# Patient Record
Sex: Male | Born: 1945 | Race: White | Hispanic: No | Smoking: Former smoker
Health system: Southern US, Community
[De-identification: ages and names within clinical notes are randomized; demographics above are authoritative.]

## PROBLEM LIST (undated history)

## (undated) DIAGNOSIS — E079 Disorder of thyroid, unspecified: Secondary | ICD-10-CM

## (undated) DIAGNOSIS — L304 Erythema intertrigo: Secondary | ICD-10-CM

## (undated) DIAGNOSIS — C9110 Chronic lymphocytic leukemia of B-cell type not having achieved remission: Secondary | ICD-10-CM

## (undated) DIAGNOSIS — R0609 Other forms of dyspnea: Secondary | ICD-10-CM

## (undated) DIAGNOSIS — F4321 Adjustment disorder with depressed mood: Secondary | ICD-10-CM

## (undated) DIAGNOSIS — I499 Cardiac arrhythmia, unspecified: Secondary | ICD-10-CM

## (undated) DIAGNOSIS — R61 Generalized hyperhidrosis: Secondary | ICD-10-CM

## (undated) DIAGNOSIS — H1030 Unspecified acute conjunctivitis, unspecified eye: Secondary | ICD-10-CM

## (undated) DIAGNOSIS — R635 Abnormal weight gain: Secondary | ICD-10-CM

## (undated) DIAGNOSIS — H05019 Cellulitis of unspecified orbit: Secondary | ICD-10-CM

## (undated) DIAGNOSIS — I350 Nonrheumatic aortic (valve) stenosis: Secondary | ICD-10-CM

## (undated) DIAGNOSIS — K8020 Calculus of gallbladder without cholecystitis without obstruction: Secondary | ICD-10-CM

## (undated) DIAGNOSIS — E785 Hyperlipidemia, unspecified: Secondary | ICD-10-CM

## (undated) DIAGNOSIS — M25519 Pain in unspecified shoulder: Secondary | ICD-10-CM

## (undated) DIAGNOSIS — G839 Paralytic syndrome, unspecified: Secondary | ICD-10-CM

## (undated) DIAGNOSIS — D485 Neoplasm of uncertain behavior of skin: Secondary | ICD-10-CM

## (undated) DIAGNOSIS — J449 Chronic obstructive pulmonary disease, unspecified: Secondary | ICD-10-CM

## (undated) DIAGNOSIS — K219 Gastro-esophageal reflux disease without esophagitis: Secondary | ICD-10-CM

## (undated) DIAGNOSIS — F1010 Alcohol abuse, uncomplicated: Secondary | ICD-10-CM

## (undated) DIAGNOSIS — N62 Hypertrophy of breast: Secondary | ICD-10-CM

## (undated) DIAGNOSIS — F32A Depression, unspecified: Secondary | ICD-10-CM

## (undated) DIAGNOSIS — I10 Essential (primary) hypertension: Secondary | ICD-10-CM

## (undated) DIAGNOSIS — M2550 Pain in unspecified joint: Secondary | ICD-10-CM

## (undated) DIAGNOSIS — M79642 Pain in left hand: Secondary | ICD-10-CM

## (undated) DIAGNOSIS — M654 Radial styloid tenosynovitis [de Quervain]: Secondary | ICD-10-CM

## (undated) DIAGNOSIS — H269 Unspecified cataract: Secondary | ICD-10-CM

## (undated) DIAGNOSIS — E039 Hypothyroidism, unspecified: Secondary | ICD-10-CM

## (undated) DIAGNOSIS — R300 Dysuria: Secondary | ICD-10-CM

## (undated) DIAGNOSIS — G4733 Obstructive sleep apnea (adult) (pediatric): Secondary | ICD-10-CM

## (undated) DIAGNOSIS — I77810 Thoracic aortic ectasia: Secondary | ICD-10-CM

## (undated) DIAGNOSIS — H3320 Serous retinal detachment, unspecified eye: Secondary | ICD-10-CM

## (undated) DIAGNOSIS — R51 Headache: Secondary | ICD-10-CM

## (undated) DIAGNOSIS — F411 Generalized anxiety disorder: Secondary | ICD-10-CM

## (undated) DIAGNOSIS — F329 Major depressive disorder, single episode, unspecified: Secondary | ICD-10-CM

## (undated) DIAGNOSIS — R002 Palpitations: Secondary | ICD-10-CM

## (undated) HISTORY — DX: Disorder of thyroid, unspecified: E07.9

## (undated) HISTORY — DX: Pain in unspecified shoulder: M25.519

## (undated) HISTORY — DX: Chronic obstructive pulmonary disease, unspecified: J44.9

## (undated) HISTORY — DX: Palpitations: R00.2

## (undated) HISTORY — DX: Headache: R51

## (undated) HISTORY — PX: RETINAL DETACHMENT SURGERY: SHX105

## (undated) HISTORY — DX: Neoplasm of uncertain behavior of skin: D48.5

## (undated) HISTORY — DX: Gastro-esophageal reflux disease without esophagitis: K21.9

## (undated) HISTORY — DX: Hypothyroidism, unspecified: E03.9

## (undated) HISTORY — DX: Chronic lymphocytic leukemia of B-cell type not having achieved remission: C91.10

## (undated) HISTORY — DX: Alcohol abuse, uncomplicated: F10.10

## (undated) HISTORY — PX: TONSILLECTOMY: SUR1361

## (undated) HISTORY — DX: Hypertrophy of breast: N62

## (undated) HISTORY — DX: Dysuria: R30.0

## (undated) HISTORY — PX: POSTERIOR LAMINECTOMY / DECOMPRESSION CERVICAL SPINE: SUR739

## (undated) HISTORY — DX: Generalized hyperhidrosis: R61

## (undated) HISTORY — DX: Obstructive sleep apnea (adult) (pediatric): G47.33

## (undated) HISTORY — PX: CATARACT EXTRACTION: SUR2

## (undated) HISTORY — DX: Unspecified cataract: H26.9

## (undated) HISTORY — DX: Erythema intertrigo: L30.4

## (undated) HISTORY — DX: Serous retinal detachment, unspecified eye: H33.20

## (undated) HISTORY — DX: Unspecified acute conjunctivitis, unspecified eye: H10.30

## (undated) HISTORY — DX: Pain in unspecified joint: M25.50

## (undated) HISTORY — DX: Thoracic aortic ectasia: I77.810

## (undated) HISTORY — DX: Essential (primary) hypertension: I10

## (undated) HISTORY — DX: Calculus of gallbladder without cholecystitis without obstruction: K80.20

## (undated) HISTORY — DX: Radial styloid tenosynovitis (de quervain): M65.4

## (undated) HISTORY — DX: Cardiac arrhythmia, unspecified: I49.9

## (undated) HISTORY — DX: Generalized anxiety disorder: F41.1

## (undated) HISTORY — DX: Hyperlipidemia, unspecified: E78.5

## (undated) HISTORY — DX: Adjustment disorder with depressed mood: F43.21

## (undated) HISTORY — DX: Abnormal weight gain: R63.5

## (undated) HISTORY — DX: Pain in left hand: M79.642

## (undated) HISTORY — DX: Cellulitis of unspecified orbit: H05.019

## (undated) HISTORY — DX: Nonrheumatic aortic (valve) stenosis: I35.0

## (undated) HISTORY — DX: Paralytic syndrome, unspecified: G83.9

## (undated) HISTORY — DX: Other forms of dyspnea: R06.09

## (undated) HISTORY — PX: BICEPS TENDON REPAIR: SHX566

---
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
## 1998-05-07 ENCOUNTER — Ambulatory Visit (HOSPITAL_COMMUNITY): Admission: RE | Admit: 1998-05-07 | Discharge: 1998-05-07 | Payer: Self-pay | Admitting: Internal Medicine

## 1998-05-07 ENCOUNTER — Encounter: Payer: Self-pay | Admitting: Internal Medicine

## 1999-04-14 ENCOUNTER — Encounter (INDEPENDENT_AMBULATORY_CARE_PROVIDER_SITE_OTHER): Payer: Self-pay | Admitting: Specialist

## 1999-04-14 ENCOUNTER — Other Ambulatory Visit: Admission: RE | Admit: 1999-04-14 | Discharge: 1999-04-14 | Payer: Self-pay | Admitting: Gastroenterology

## 1999-04-14 HISTORY — PX: COLONOSCOPY: SHX174

## 1999-04-14 HISTORY — PX: POLYPECTOMY: SHX149

## 2000-06-15 ENCOUNTER — Ambulatory Visit (HOSPITAL_COMMUNITY): Admission: RE | Admit: 2000-06-15 | Discharge: 2000-06-15 | Payer: Self-pay | Admitting: Internal Medicine

## 2001-03-07 ENCOUNTER — Encounter: Payer: Self-pay | Admitting: Emergency Medicine

## 2001-03-08 ENCOUNTER — Inpatient Hospital Stay (HOSPITAL_COMMUNITY): Admission: AC | Admit: 2001-03-08 | Discharge: 2001-03-10 | Payer: Self-pay

## 2001-03-08 ENCOUNTER — Encounter: Payer: Self-pay | Admitting: Internal Medicine

## 2001-03-28 ENCOUNTER — Encounter: Payer: Self-pay | Admitting: Neurosurgery

## 2001-03-30 ENCOUNTER — Ambulatory Visit (HOSPITAL_COMMUNITY): Admission: RE | Admit: 2001-03-30 | Discharge: 2001-03-31 | Payer: Self-pay | Admitting: Neurosurgery

## 2001-03-30 ENCOUNTER — Encounter: Payer: Self-pay | Admitting: Neurosurgery

## 2001-05-16 ENCOUNTER — Encounter: Payer: Self-pay | Admitting: Neurosurgery

## 2001-05-16 ENCOUNTER — Ambulatory Visit (HOSPITAL_COMMUNITY): Admission: RE | Admit: 2001-05-16 | Discharge: 2001-05-16 | Payer: Self-pay | Admitting: Neurosurgery

## 2001-11-15 ENCOUNTER — Encounter: Admission: RE | Admit: 2001-11-15 | Discharge: 2001-11-15 | Payer: Self-pay | Admitting: Neurosurgery

## 2001-11-15 ENCOUNTER — Encounter: Payer: Self-pay | Admitting: Neurosurgery

## 2002-03-22 HISTORY — PX: ROTATOR CUFF REPAIR: SHX139

## 2005-12-07 ENCOUNTER — Ambulatory Visit (HOSPITAL_COMMUNITY): Admission: RE | Admit: 2005-12-07 | Discharge: 2005-12-08 | Payer: Self-pay | Admitting: Ophthalmology

## 2005-12-21 ENCOUNTER — Ambulatory Visit: Payer: Self-pay | Admitting: Internal Medicine

## 2005-12-23 ENCOUNTER — Ambulatory Visit: Payer: Self-pay | Admitting: Internal Medicine

## 2006-01-20 ENCOUNTER — Ambulatory Visit (HOSPITAL_COMMUNITY): Admission: RE | Admit: 2006-01-20 | Discharge: 2006-01-21 | Payer: Self-pay | Admitting: Ophthalmology

## 2006-12-02 ENCOUNTER — Encounter: Payer: Self-pay | Admitting: Internal Medicine

## 2006-12-02 DIAGNOSIS — E785 Hyperlipidemia, unspecified: Secondary | ICD-10-CM

## 2006-12-02 DIAGNOSIS — K219 Gastro-esophageal reflux disease without esophagitis: Secondary | ICD-10-CM

## 2006-12-02 HISTORY — DX: Gastro-esophageal reflux disease without esophagitis: K21.9

## 2007-07-20 ENCOUNTER — Ambulatory Visit: Payer: Self-pay | Admitting: Internal Medicine

## 2007-07-21 LAB — CONVERTED CEMR LAB
ALT: 19 units/L (ref 0–53)
AST: 20 units/L (ref 0–37)
Albumin: 3.7 g/dL (ref 3.5–5.2)
Alkaline Phosphatase: 68 units/L (ref 39–117)
BUN: 7 mg/dL (ref 6–23)
Bacteria, UA: NEGATIVE
Basophils Absolute: 0 10*3/uL (ref 0.0–0.1)
Basophils Relative: 0 % (ref 0.0–1.0)
Bilirubin Urine: NEGATIVE
Bilirubin, Direct: 0.1 mg/dL (ref 0.0–0.3)
CO2: 33 meq/L — ABNORMAL HIGH (ref 19–32)
Calcium: 9.3 mg/dL (ref 8.4–10.5)
Chloride: 103 meq/L (ref 96–112)
Cholesterol: 185 mg/dL (ref 0–200)
Creatinine, Ser: 0.9 mg/dL (ref 0.4–1.5)
Crystals: NEGATIVE
Direct LDL: 109.1 mg/dL
Eosinophils Absolute: 0.2 10*3/uL (ref 0.0–0.7)
Eosinophils Relative: 2 % (ref 0.0–5.0)
GFR calc Af Amer: 110 mL/min
GFR calc non Af Amer: 91 mL/min
Glucose, Bld: 91 mg/dL (ref 70–99)
HCT: 52.1 % — ABNORMAL HIGH (ref 39.0–52.0)
HDL: 32.5 mg/dL — ABNORMAL LOW (ref >39.0)
Hemoglobin: 17.2 g/dL — ABNORMAL HIGH (ref 13.0–17.0)
Ketones, ur: NEGATIVE mg/dL
Leukocytes, UA: NEGATIVE
Lymphocytes Relative: 45.4 % (ref 12.0–46.0)
MCHC: 33.1 g/dL (ref 30.0–36.0)
MCV: 93.3 fL (ref 78.0–100.0)
Monocytes Absolute: 0.8 10*3/uL (ref 0.1–1.0)
Monocytes Relative: 8.7 % (ref 3.0–12.0)
Mucus, UA: NEGATIVE
Neutro Abs: 4 10*3/uL (ref 1.4–7.7)
Neutrophils Relative %: 43.9 % (ref 43.0–77.0)
Nitrite: NEGATIVE
PSA: 0.57 ng/mL (ref 0.10–4.00)
Platelets: 204 10*3/uL (ref 150–400)
Potassium: 4.9 meq/L (ref 3.5–5.1)
RBC: 5.59 M/uL (ref 4.22–5.81)
RDW: 13.5 % (ref 11.5–14.6)
Sodium: 142 meq/L (ref 135–145)
Specific Gravity, Urine: 1.01 (ref 1.000–1.03)
TSH: 2.2 microintl units/mL (ref 0.35–5.50)
Total Bilirubin: 0.8 mg/dL (ref 0.3–1.2)
Total CHOL/HDL Ratio: 5.7
Total Protein, Urine: NEGATIVE mg/dL
Total Protein: 7.1 g/dL (ref 6.0–8.3)
Triglycerides: 306 mg/dL (ref 0–149)
Urine Glucose: NEGATIVE mg/dL
Urobilinogen, UA: 0.2 (ref 0.0–1.0)
VLDL: 61 mg/dL — ABNORMAL HIGH (ref 0–40)
WBC, UA: NONE SEEN cells/hpf
WBC: 9.1 10*3/uL (ref 4.5–10.5)
pH: 6 (ref 5.0–8.0)

## 2007-07-26 ENCOUNTER — Ambulatory Visit: Payer: Self-pay | Admitting: Internal Medicine

## 2007-07-26 DIAGNOSIS — F17200 Nicotine dependence, unspecified, uncomplicated: Secondary | ICD-10-CM

## 2007-07-26 DIAGNOSIS — J449 Chronic obstructive pulmonary disease, unspecified: Secondary | ICD-10-CM | POA: Insufficient documentation

## 2007-07-26 DIAGNOSIS — I10 Essential (primary) hypertension: Secondary | ICD-10-CM

## 2007-07-26 HISTORY — DX: Chronic obstructive pulmonary disease, unspecified: J44.9

## 2009-02-22 ENCOUNTER — Ambulatory Visit: Payer: Self-pay | Admitting: Family Medicine

## 2009-02-22 DIAGNOSIS — H05019 Cellulitis of unspecified orbit: Secondary | ICD-10-CM

## 2009-02-22 DIAGNOSIS — H1030 Unspecified acute conjunctivitis, unspecified eye: Secondary | ICD-10-CM | POA: Insufficient documentation

## 2009-02-22 HISTORY — DX: Cellulitis of unspecified orbit: H05.019

## 2009-05-07 ENCOUNTER — Ambulatory Visit: Payer: Self-pay | Admitting: Internal Medicine

## 2009-05-07 LAB — CONVERTED CEMR LAB
ALT: 18 units/L (ref 0–53)
AST: 23 units/L (ref 0–37)
Albumin: 4 g/dL (ref 3.5–5.2)
Alkaline Phosphatase: 73 units/L (ref 39–117)
BUN: 5 mg/dL — ABNORMAL LOW (ref 6–23)
Basophils Absolute: 0 10*3/uL (ref 0.0–0.1)
Basophils Relative: 0.2 % (ref 0.0–3.0)
Bilirubin Urine: NEGATIVE
Bilirubin, Direct: 0.2 mg/dL (ref 0.0–0.3)
CO2: 31 meq/L (ref 19–32)
Calcium: 9 mg/dL (ref 8.4–10.5)
Chloride: 105 meq/L (ref 96–112)
Cholesterol: 163 mg/dL (ref 0–200)
Creatinine, Ser: 0.7 mg/dL (ref 0.4–1.5)
Eosinophils Absolute: 0.1 10*3/uL (ref 0.0–0.7)
Eosinophils Relative: 1.3 % (ref 0.0–5.0)
GFR calc non Af Amer: 121 mL/min (ref >60)
Glucose, Bld: 90 mg/dL (ref 70–99)
HCT: 47.4 % (ref 39.0–52.0)
HDL: 46.2 mg/dL (ref >39.00)
Hemoglobin, Urine: NEGATIVE
Hemoglobin: 16.2 g/dL (ref 13.0–17.0)
Ketones, ur: NEGATIVE mg/dL
LDL Cholesterol: 90 mg/dL (ref 0–99)
Leukocytes, UA: NEGATIVE
Lymphocytes Relative: 26.3 % (ref 12.0–46.0)
Lymphs Abs: 2.6 10*3/uL (ref 0.7–4.0)
MCHC: 34.1 g/dL (ref 30.0–36.0)
MCV: 95.6 fL (ref 78.0–100.0)
Monocytes Absolute: 1 10*3/uL (ref 0.1–1.0)
Monocytes Relative: 10.1 % (ref 3.0–12.0)
Neutro Abs: 6.2 10*3/uL (ref 1.4–7.7)
Neutrophils Relative %: 62.1 % (ref 43.0–77.0)
Nitrite: NEGATIVE
PSA: 0.25 ng/mL (ref 0.10–4.00)
Platelets: 170 10*3/uL (ref 150.0–400.0)
Potassium: 4.3 meq/L (ref 3.5–5.1)
RBC: 4.96 M/uL (ref 4.22–5.81)
RDW: 12.4 % (ref 11.5–14.6)
Sodium: 140 meq/L (ref 135–145)
Specific Gravity, Urine: <=1.005 (ref 1.000–1.030)
TSH: 1.57 microintl units/mL (ref 0.35–5.50)
Total Bilirubin: 0.9 mg/dL (ref 0.3–1.2)
Total CHOL/HDL Ratio: 4
Total Protein, Urine: NEGATIVE mg/dL
Total Protein: 6.8 g/dL (ref 6.0–8.3)
Triglycerides: 136 mg/dL (ref 0.0–149.0)
Urine Glucose: NEGATIVE mg/dL
Urobilinogen, UA: 0.2 (ref 0.0–1.0)
VLDL: 27.2 mg/dL (ref 0.0–40.0)
WBC: 9.9 10*3/uL (ref 4.5–10.5)
pH: 6 (ref 5.0–8.0)

## 2009-05-14 ENCOUNTER — Ambulatory Visit: Payer: Self-pay | Admitting: Internal Medicine

## 2010-04-21 NOTE — Assessment & Plan Note (Signed)
Summary: CPX/BCBS/#/CD   Vital Signs:  Patient profile:   65 year old male Height:      71 inches Weight:      173 pounds BMI:     24.22 Temp:     98.1 degrees F oral Pulse rate:   64 / minute BP sitting:   140 / 84  (left arm)  Vitals Entered By: Tora Perches (May 14, 2009 8:47 AM) CC: cpx Is Patient Diabetic? No   CC:  cpx.  History of Present Illness: The patient presents for a wellness examination C/o joint stiffness, stress, anxiety  Preventive Screening-Counseling & Management  Alcohol-Tobacco     Smoking Status: current  Current Medications (verified): 1)  Baby Aspirin 81 Mg  Chew (Aspirin) .... Once Daily 2)  Lovastatin 20 Mg  Tabs (Lovastatin) .... Once Daily 3)  Norvasc 5 Mg  Tabs (Amlodipine Besylate) .... 1/2 Once Daily 4)  Vitamin D3 1000 Unit  Tabs (Cholecalciferol) .[name].. 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 COPD Hypertension  Past Surgical History: Last updated: 07/26/2007 Cervical laminectomy/decompression Dr Wynetta Emery  Family History: Last updated: 07/26/2007 CAD  Social History: Last updated: 07/26/2007 Occupation: food Airline pilot Married Current Smoker Alcohol use-yes Regular exercise-no  Review of Systems  The patient denies anorexia, fever, weight loss, weight gain, vision loss, decreased hearing, hoarseness, chest pain, syncope, dyspnea on exertion, peripheral edema, prolonged cough, headaches, hemoptysis, abdominal pain, melena, hematochezia, severe indigestion/heartburn, hematuria, incontinence, genital sores, muscle weakness, suspicious skin lesions, transient blindness, difficulty walking, depression, unusual weight change, abnormal bleeding, enlarged lymph nodes, angioedema, and testicular masses.         stressed  Physical  Exam  General:  elerly male in NAD Head:  mild left facial ttp Eyes:  left conjunctiva very erythematous, surrounding periorbital tissues with swellong and erythema. Pupils constricted B, but PERRLA, EOMI, no pain with movement of eye Ears:  External ear exam shows no significant lesions or deformities.  Otoscopic examination reveals clear canals, tympanic membranes are intact bilaterally without bulging, retraction, inflammation or discharge. Hearing is grossly normal bilaterally. Nose:  External nasal examination shows no deformity or inflammation. Nasal mucosa are pink and moist without lesions or exudates. Mouth:  Oral mucosa and oropharynx without lesions or exudates.  Teeth in good repair. Neck:  no carotid bruit or thyromegaly no cervical or supraclavicular lymphadenopathy  Lungs:  Normal respiratory effort, chest expands symmetrically. Lungs are clear to auscultation, no crackles or wheezes. Heart:  Normal rate and regular rhythm. S1 and S2 normal without gallop, murmur, click, rub or other extra sounds. Abdomen:  Bowel sounds positive,abdomen soft and non-tender without masses, organomegaly or hernias noted. Rectal:  No external abnormalities noted. Normal sphincter tone. No rectal masses or tenderness. Genitalia:  WNL Prostate:  1+ enlarged.   Msk:  Using a cane Extremities:  no edema Neurologic:  R LE weak Skin:  Intact without suspicious lesions or rashes Cervical Nodes:  No lymphadenopathy noted Inguinal Nodes:  No significant adenopathy Psych:  Cognition and judgment appear intact. Alert and cooperative with normal attention span and concentration. No apparent delusions, illusions, hallucinations   Impression & Recommendations:  Problem # 1:  WELL ADULT EXAM (ICD-V70.0) Assessment New Health and age related issues were discussed. Available screening tests and vaccinations were discussed as well. Healthy life style including good diet and execise  was discussed.  The labs  were reviewed with the patient.  Colonosc adviced - declined Orders: EKG w/ Interpretation (93000) T-2 View CXR, Same Day (71020.5TC)  Problem # 2:  TOBACCO USE DISORDER/SMOKER-SMOKING CESSATION DISCUSSED (ICD-305.1) Assessment: Unchanged  Encouraged smoking cessation and discussed different methods for smoking cessation.   Problem # 3:  GERD (ICD-530.81) Assessment: Improved  Problem # 4:  HYPERTENSION (ICD-401.9) Assessment: Unchanged  His updated medication list for this problem includes:    Norvasc 5 Mg Tabs (Amlodipine besylate) .[name]... 1 once daily  Complete Medication List: 1)  Lovastatin 20 Mg Tabs (Lovastatin) .... Once daily 2)  Norvasc 5 Mg Tabs (Amlodipine besylate) .[name].. 1 once daily 3)  Vitamin D3 1000 Unit Tabs (Cholecalciferol) .[name].. 1 by mouth daily 4)  Baby Aspirin 81 Mg Chew (Aspirin) .... Once daily 5)  Diazepam 5 Mg Tabs (Diazepam) .... 1/2 or 1 by mouth two times a day as needed anxiety  Other Orders: Tdap => 20yrs IM (14782) Admin 1st Vaccine (95621) Admin 1st Vaccine [facility]) [id number])  Patient Instructions: 1)  Please schedule a follow-up appointment in 1 year well w/labs. 2)  Use stretching exercises that I have provided (15 min. or longer every day)  Contraindications/Deferment of Procedures/Staging:    Test/Procedure: Colonoscopy    Reason for deferment: patient declined     Test/Procedure: Pneumovax vaccine    Reason for deferment: patient declined  Prescriptions: LOVASTATIN 20 MG  TABS (LOVASTATIN) once daily  #90 x 3   Entered and Authorized by:   Tresa Garter MD   Signed by:   Tresa Garter MD on 05/14/2009   Method used:   Print then Give to Patient   RxID:   [id number] DIAZEPAM 5 MG TABS (DIAZEPAM) 1/2 or 1 by mouth two times a day as needed anxiety  #60 x 3   Entered and Authorized by:   Tresa Garter MD   Signed by:   Tresa Garter MD on 05/14/2009   Method used:   Print then Give to Patient   RxID:    [id number] NORVASC 5 MG  TABS (AMLODIPINE BESYLATE) 1 once daily  #90 x 3   Entered and Authorized by:   Tresa Garter MD   Signed by:   Tresa Garter MD on 05/14/2009   Method used:   Print then Give to Patient   RxID:   [id number]    Tetanus/Td Vaccine    Vaccine Type: Tdap    Site: left deltoid    Mfr: GlaxoSmithKline    Dose: 0.5 ml    Route: IM    Given by: Tora Perches    Exp. Date: 05/17/2011    Lot #: [id number]    VIS given: 02/07/07 version given May 14, 2009.

## 2010-08-07 NOTE — Op Note (Signed)
NAME:  Joe [name], Joe [name] NO.:  [id number]   MEDICAL RECORD NO.:  [id number]          PATIENT TYPE:  OIB   LOCATION:  5506                         FACILITY:  [facility]   PHYSICIAN:  Beulah Gandy. Ashley Royalty, M.D. 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   PROCEDURES:  Scleral buckle revision, left eye.  Pars plana vitrectomy, left  eye.  Membrane peel, left eye.  Perfluoron injection, left eye.  Perfluoron  removal, left eye.  Gas fluid exchange, left eye.  Silicone oil injection,  left eye.   SURGEON:  Alan Mulder, MD   ASSISTANT:  Rosalie Doctor, MA   ANESTHESIA:  General.   DETAILS:  Usual prep and drape, peritomy from 2 o'clock to 10 o'clock at the  limbus.  Blunt and sharp dissection was used to imbricate the lateral  inferior and medial rectus muscle on 2-0 silk.  The buckle was opened from 3  o'clock to 9 o'clock.  Posterior scleral dissection was carried out from 4  o'clock to 6 o'clock.  A 508G radial segment was placed in this area.  Two  sutures per quadrant for a total of four sutures were placed in the scleral  flaps.  Indirect ophthalmoscopy showed the buckle well positioned beneath  the breaks.  The attention was carried to the pars plana area where  sclerotomies were performed at 10, 2 and 4 o'clock, 4 mm infusion at 4  o'clock.  The lighted pick and cutter at 10 and 2 o'clock respectively.  The  Biome viewing system moved into place and pars plana vitrectomy was begun.  Dense vitreous membranes and proliferative vitreoretinopathy was seen.  These membranes were carefully removed under low suction and rapid cutting.  Membranes were attached to the retina in the entire periphery and they were  trimmed down to the retinal surface for 360  degrees.  Some folding of the  retina was also peeled.  Once all membranes were removed.  Perfluoron was  injected to reattach the retina.  The endolaser was positioned in the eye  and 1669 burns placed around the retinal breaks at 4 o'clock and also around  the retinal breaks between 10 and 12 o'clock.  The power was between 1000,  1500 milliwatts, 1000 microns each and 0.1 seconds each.  The Perfluoron was  then removed from the eye with a [place] ophthalmics brush.  A vitreous wash  was performed with BSS.  Silicone oil was then injected in the vitreous  cavity to reinflate the globe.  A total gas fill was performed without an  over fill.  The instruments were removed from the eye and 9-0 nylon was used  to close the sclerotomy sites.  The conjunctiva was reposited with 7-0  chromic suture.  The polymyxin and gentamicin were irrigated into Tenon's  space, Decadron 10 mg was injected to the lower subconjunctival space.  Marcaine was injected around  the  globe for postop pain.  Atropine solution was applied.  TobraDex ophthalmic  ointment, a patch and shield were placed.  Closing pressure was 15 with a  [place] tonometer.  Complications none.  Duration 2 hours.  The patient  was awakened, taken to recovery in satisfactory condition.      Beulah Gandy. Ashley Royalty, M.D.  Electronically Signed     JDM/MEDQ  D:  01/20/2006  T:  01/21/2006  Job:  [id number]

## 2010-08-07 NOTE — Op Note (Signed)
NAME:  Joe [name], Joe [name]              ACCOUNT NO.:  [id number]   MEDICAL RECORD NO.:  [id number]          PATIENT TYPE:  AMB   LOCATION:  SDS                          FACILITY:  [facility]   PHYSICIAN:  John D. Ashley Royalty, M.D. DATE OF BIRTH:  01/20/1946   DATE OF PROCEDURE:  12/07/2005  DATE OF DISCHARGE:                                 OPERATIVE REPORT   ADMISSION DIAGNOSIS:  Rhegmatogenous retinal detachment, left eye.   PROCEDURE:  Scleral buckle left eye, retinal photocoagulation left eye, gas  fluid exchange left eye.   SURGEON:  Beulah Gandy. Ashley Royalty, M.D.   ASSISTANT:  Bryan Lemma. Lundquist, P.A.-C.   ANESTHESIA:  General.   DETAILS:  Usual prep and drape, 360 degrees limbal peritomy, isolation of  four rectus muscles on 2-0 silk, localization of breaks at 10 o'clock, 11 to  12 o'clock, and 1 o'clock.  Scleral dissection for 360 degrees to admit a  number 279 intrascleral implant.  Diathermy placed in the bed.  The 279  implant was placed around the globe with the joint at 8 o'clock. The 240  band was placed around the globe with the 270 sleeve at 8 o'clock.  Perforation site chosen at 1 o'clock in the posterior aspect of the bed.  A  large amount of clear colorless subretinal fluid came forth.  Perfluoron  propane, 50%, 2 mL, was injected into the vitreous cavity to reinflate the  globe. The buckle was adjusted and trimmed.  The band was adjusted and  trimmed.  The scleral sutures were knotted and the free ends removed.  Indirect ophthalmoscopy showed the retina to be lying nicely on the scleral  buckle and fully reattached for 360 degrees. The indirect ophthalmoscope  laser was moved into place, 425 burns were placed around the retinal breaks  and on the buckle for 360 degrees.  The power was 1500 milliwatts, 1000  microns each, and 0.1-0.2 seconds each. The conjunctiva was reposted with 7-  0 chromic suture.  Polymyxin and gentamicin were irrigated into tenon's  space.  Atropine  solution was applied.  Marcaine was injected around the  globe for postop pain. Decadron 10 mg was injected into the lower  subconjunctival space. TobraDex ophthalmic ointment, a patch and shield were  placed.  The patient was awakened and taken to the recovery room in  satisfactory condition.  Complications none.  Duration two hours.      Beulah Gandy. Ashley Royalty, M.D.  Electronically Signed     JDM/MEDQ  D:  12/07/2005  T:  12/08/2005  Job:  [id number]

## 2010-08-07 NOTE — Op Note (Signed)
[place]  Patient:    Joe [name], Joe [name] Visit Number: [id number] MRN: [id number]          Service Type: DSU Location: 3000 [id number] Attending Physician:  Mariam Dollar Dictated by:   Garlon Hatchet., M.D. Proc. 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  PROCEDURES: 1. Decompressive cervical laminectomy of C3 and C4 with decompression of the    C4 and C5 nerve roots bilaterally. 2. Posterior cervical fusion using a Vertex lateral mass screw system and    locally harvested autograft.  SURGEON:  Garlon Hatchet., M.D.  ASSISTANT:  Reinaldo Meeker, M.D.  ANESTHESIA:  General endotracheal.  CLINICAL NOTE:  The patient is a very pleasant 65 year old gentleman who was admitted back in December after suffering a fall and severe weakness of his hand and numbness and tingling and burning in his hand as well as stumbling with his gait.  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  The patient clinically was noted to have a central cord syndrome.  He was placed on steroids and allowed for spinal cord edema to settle down and has been extensively counseled and recommended for decompressive cervical laminectomy and fusion.  Due to the patients severe cervical stenosis and the need for a wide lateral decompression with symptoms that included C5 radiculopathy as well as the central cord syndrome, it was felt that he needed subsequent fusion after decompression to prevent further development of cervical kyphotic deformity.  The patient extensively was explained the risks and benefits and decided to proceed forward.  DESCRIPTION OF PROCEDURE:  The patient was brought in the OR, was induced under general anesthesia, positioned prone in pins.  The back side of his neck was prepped and draped in the usual  sterile fashion.  A midline incision was made after infiltrating 10 cc of lidocaine with epinephrine.  Bovie electrocautery was used to take this down to the subcutaneous tissue. Subperiosteal dissection was carried out in the laminae of C2, C3, C4, and C5 bilaterally, and a self-retaining retractor was placed.  Intraoperative x-ray confirmed localization of the C4-5 facet complex.  Decompressive laminectomy was begun with the Leksell rongeur and 3 mm Kerrison punch.  The thecal sac was noted to be severely stenotic at the C3-4 interspace, felt to be the location of where the hematomyelia and the spinal cord bruise occurred.  This was all radically decompressed and then attention was taken to exploration of the C4 and C5 nerve roots.  Using a 2 mm Kerrison punch, the medial and proximal aspects of these foramina were opened up and explored with a Rhoton dissector and noted to be widely patent.  The superior aspect of the lamina of C5 was also removed in addition to all of the lamina at C3 and C4.  The inferior aspect of the lamina of C2 was left intact, and the hypertrophied ligament at the C2-3 junction was removed.  After the decompression was completed, attention was taken to the lateral mass screw placement.  Using a quadrant technique, the inferior quadrant of each of the C3, C4, and C5 laminae was identified and a pilot hole was drilled with the high-speed [place]ach drill.  Then using a hand drill, tapping the medial cortex  and placement, six lateral mass screws were inserted; that was three 14 mm screws on the left, two 14 mm screws on the right, and one 12 mm after the C4 lateral mass screw on the right got stripped, a 4.0 rescue was inserted at this site. All holes were probed with a 7 Rhoton dissector and noted to be competent in 360 degree orientation.  After the lateral mass screws were placed, the wound was copiously irrigated and meticulous hemostasis was maintained.  All  lateral facet complexes and the actual facet joint were decorticated, and the remainder of the locally harvested autograft was packed along the outside of the screws.  Then a rod was selected, sized, and cut and fashioned with a lordosis, and the self-tightening nuts were applied and torqued down.  After this, Gelfoam was overlaid on top of the dura and meticulous hemostasis was maintained.  The fascia was closed with 0 interrupted Vicryl, the subcutaneous tissues closed with 2-0 interrupted, and the skin was closed with a running 4-0 subcuticular, benzoin and Steri-Strips.  The patient was taken to the recovery room in stable condition.  At the end of the procedure, all needle counts and sponge counts were correct. Dictated by:   Garlon Hatchet., M.D. Attending Physician:  Mariam Dollar DD:  03/30/01 TD:  03/30/01 Job: [id number]

## 2010-08-07 NOTE — Assessment & Plan Note (Signed)
[facility]                             PRIMARY CARE OFFICE NOTE   Joe [name], Joe [name]                     MRN:          [id number]  DATE:12/23/2005                            DOB:          [birth year]    The patient is a 65 year old male who presents for a wellness examination.   ALLERGIES:  None.   PAST MEDICAL HISTORY/FAMILY HISTORY/SOCIAL HISTORY:  As per July 11, 2003,  note.  Has had an eye surgery for detached retina.  Continues to work about  73 hours per week.  Continues to smoke about 2 packs a day.   REVIEW OF SYSTEMS:  Blurred vision.  No chest pain or shortness of breath.  No syncope.  No neurologic complaints.  He continues to exercise daily on a  bicycle with no cardiovascular problems.  Occasional back pain.  The rest is  negative.   PHYSICAL:  VITAL SIGNS:  Blood pressure 149/87.  Pulse 69.  Temperature 98.  Weight 200 pounds (was 199).  GENERAL:  He is in no acute distress.  HEENT:  With moist mucosa.  NECK:  Supple.  No thyromegaly or bruit.  LUNGS:  Clear.  No wheeze or rales.  HEART:  S1, S2 normal.  No murmur, no gallop.  ABDOMEN:  Soft.  No hepatosplenomegaly or mass.  LOWER EXTREMITIES:  Without edema.  Hips and lower back with decreased range  of motion because of arthritis.  He does limp and walks with a cane.  RECTAL:  Reveals normal prostate.  No nodules.  No masses.  Stool guaiac  negative.   LABS FROM December 21 2005:  White count 16.9.  PSA 0.5.  TSH 2.1.  CMET  normal.  Urinalysis normal.  Cholesterol 242.  LDL 160.  Hemoglobin 16.7.  EKG with normal sinus rhythm.   ASSESSMENT AND PLAN:  1. Normal wellness examination.  Age/health related issues discussed.      Healthy lifestyle discussed.  He needs to stop smoking.  He refused a      stress test.  In view of his decreased exercise tolerance he was asked      to take aspirin 81 mg daily.  He will be due for colonoscopy next year.  2. Dyslipidemia.  He will  start lovastatin 40 mg daily.  3. Elevated blood pressure.  Add Norvasc 5 mg, 1/2 daily.  4. Elevated white count and hemoglobin, likely related to his smoking.      Will recheck in six months.    ______________________________  Joe Quint. [name], MD    AVP/MedQ  DD: 01/19/2006  DT: 01/19/2006  Job #: [id number]   cc:   Beulah Gandy. Ashley Royalty, M.D.

## 2010-08-20 ENCOUNTER — Other Ambulatory Visit (INDEPENDENT_AMBULATORY_CARE_PROVIDER_SITE_OTHER): Payer: BC Managed Care – HMO

## 2010-08-20 ENCOUNTER — Other Ambulatory Visit: Payer: Self-pay | Admitting: Internal Medicine

## 2010-08-20 DIAGNOSIS — Z0000 Encounter for general adult medical examination without abnormal findings: Secondary | ICD-10-CM

## 2010-08-20 DIAGNOSIS — Z0389 Encounter for observation for other suspected diseases and conditions ruled out: Secondary | ICD-10-CM

## 2010-08-20 LAB — LIPID PANEL
Cholesterol: 173 mg/dL (ref 0–200)
Triglycerides: 147 mg/dL (ref 0.0–149.0)

## 2010-08-20 LAB — BASIC METABOLIC PANEL
BUN: 8 mg/dL (ref 6–23)
Calcium: 9.7 mg/dL (ref 8.4–10.5)
Creatinine, Ser: 0.6 mg/dL (ref 0.4–1.5)
GFR: 141.24 mL/min (ref >60.00)

## 2010-08-20 LAB — CBC WITH DIFFERENTIAL/PLATELET
Basophils Absolute: 0.1 10*3/uL (ref 0.0–0.1)
Basophils Relative: 0.4 % (ref 0.0–3.0)
Eosinophils Relative: 1.5 % (ref 0.0–5.0)
HCT: 50.1 % (ref 39.0–52.0)
Hemoglobin: 16.8 g/dL (ref 13.0–17.0)
Lymphocytes Relative: 44.5 % (ref 12.0–46.0)
Lymphs Abs: 5.4 10*3/uL — ABNORMAL HIGH (ref 0.7–4.0)
Monocytes Relative: 8.4 % (ref 3.0–12.0)
Neutro Abs: 5.5 10*3/uL (ref 1.4–7.7)
RBC: 5.23 Mil/uL (ref 4.22–5.81)
WBC: 12.1 10*3/uL — ABNORMAL HIGH (ref 4.5–10.5)

## 2010-08-20 LAB — URINALYSIS
Ketones, ur: NEGATIVE
Specific Gravity, Urine: <=1.005 (ref 1.000–1.030)
Urine Glucose: NEGATIVE
pH: 6 (ref 5.0–8.0)

## 2010-08-20 LAB — TSH: TSH: 1.77 u[IU]/mL (ref 0.35–5.50)

## 2010-08-20 LAB — HEPATIC FUNCTION PANEL
Albumin: 3.9 g/dL (ref 3.5–5.2)
Total Bilirubin: 0.8 mg/dL (ref 0.3–1.2)

## 2010-08-21 ENCOUNTER — Encounter: Payer: Self-pay | Admitting: Internal Medicine

## 2010-08-24 ENCOUNTER — Ambulatory Visit (INDEPENDENT_AMBULATORY_CARE_PROVIDER_SITE_OTHER): Payer: BC Managed Care – HMO | Admitting: Internal Medicine

## 2010-08-24 ENCOUNTER — Encounter: Payer: Self-pay | Admitting: Internal Medicine

## 2010-08-24 VITALS — BP 142/90 | HR 88 | Temp 98.6°F | Resp 16 | Wt 168.0 lb

## 2010-08-24 DIAGNOSIS — Z0000 Encounter for general adult medical examination without abnormal findings: Secondary | ICD-10-CM

## 2010-08-24 DIAGNOSIS — M6250 Muscle wasting and atrophy, not elsewhere classified, unspecified site: Secondary | ICD-10-CM

## 2010-08-24 DIAGNOSIS — F1010 Alcohol abuse, uncomplicated: Secondary | ICD-10-CM

## 2010-08-24 DIAGNOSIS — J449 Chronic obstructive pulmonary disease, unspecified: Secondary | ICD-10-CM

## 2010-08-24 DIAGNOSIS — F17200 Nicotine dependence, unspecified, uncomplicated: Secondary | ICD-10-CM

## 2010-08-24 DIAGNOSIS — R5381 Other malaise: Secondary | ICD-10-CM | POA: Insufficient documentation

## 2010-08-24 DIAGNOSIS — E785 Hyperlipidemia, unspecified: Secondary | ICD-10-CM

## 2010-08-24 HISTORY — DX: Alcohol abuse, uncomplicated: F10.10

## 2010-08-24 MED ORDER — AMLODIPINE BESYLATE 5 MG PO TABS: 5.0000 mg | ORAL_TABLET | Freq: Every day | ORAL | Status: DC

## 2010-08-24 MED ORDER — LOVASTATIN 20 MG PO TABS: 20.0000 mg | ORAL_TABLET | Freq: Every day | ORAL | Status: DC

## 2010-08-24 MED ORDER — DIAZEPAM 5 MG PO TABS: 5.0000 mg | ORAL_TABLET | Freq: Two times a day (BID) | ORAL | Status: DC | PRN

## 2010-08-24 NOTE — Progress Notes (Signed)
  Subjective:    Patient ID: Joe [name], male    DOB: [birth year], 65 y.o.   MRN: [id number]  HPI  The patient is here for a wellness exam. The patient has been doing well overall without major physical or psychological issues going on lately. The patient needs to address  chronic hypertension that has been well controlled with medicines; to address chronic  hyperlipidemia controlled with medical treatment and diet.   Review of Systems  Constitutional: Positive for fatigue. Negative for appetite change and unexpected weight change.  HENT: Negative for nosebleeds, congestion, sore throat, sneezing, trouble swallowing and neck pain.   Eyes: Negative for itching and visual disturbance.  Respiratory: Negative for cough.   Cardiovascular: Negative for chest pain, palpitations and leg swelling.  Gastrointestinal: Negative for nausea, diarrhea, blood in stool and abdominal distention.  Genitourinary: Negative for frequency and hematuria.  Musculoskeletal: Positive for back pain. Negative for joint swelling and gait problem.  Skin: Negative for rash.  Neurological: Positive for weakness. Negative for dizziness, tremors and speech difficulty.  Psychiatric/Behavioral: Negative for sleep disturbance, dysphoric mood and agitation. The patient is nervous/anxious.        Objective:   Physical Exam  Constitutional: He is oriented to person, place, and time. He appears well-developed and well-nourished. No distress.  HENT:  Head: Normocephalic and atraumatic.  Right Ear: External ear normal.  Left Ear: External ear normal.  Nose: Nose normal.  Mouth/Throat: Oropharynx is clear and moist. No oropharyngeal exudate.  Eyes: Conjunctivae and EOM are normal. Pupils are equal, round, and reactive to light. Right eye exhibits no discharge. Left eye exhibits no discharge. No scleral icterus.  Neck: Normal range of motion. Neck supple. No JVD present. No tracheal deviation present. No thyromegaly  present.  Cardiovascular: Normal rate, regular rhythm, normal heart sounds and intact distal pulses.  Exam reveals no gallop and no friction rub.   No murmur heard. Pulmonary/Chest: Effort normal and breath sounds normal. No stridor. No respiratory distress. He has no wheezes. He has no rales. He exhibits no tenderness.  Abdominal: Soft. Bowel sounds are normal. He exhibits no distension and no mass. There is no tenderness. There is no rebound and no guarding.  Genitourinary: Rectum normal, prostate normal and penis normal. Guaiac negative stool. No penile tenderness.  Musculoskeletal: Normal range of motion. He exhibits no edema and no tenderness.  Lymphadenopathy:    He has no cervical adenopathy.  Neurological: He is alert and oriented to person, place, and time. He has normal reflexes. No cranial nerve deficit. He exhibits normal muscle tone. Coordination normal.  Skin: Skin is warm and dry. No rash noted. He is not diaphoretic. No erythema. No pallor.  Psychiatric: He has a normal mood and affect. His behavior is normal. Judgment and thought content normal.          Assessment & Plan:

## 2010-08-24 NOTE — Assessment & Plan Note (Signed)
He will start exercising

## 2010-08-24 NOTE — Assessment & Plan Note (Signed)
Needs to quit.  

## 2010-08-24 NOTE — Assessment & Plan Note (Signed)
Discussed.

## 2010-08-24 NOTE — Assessment & Plan Note (Signed)
We discussed age appropriate health related issues, including available/recomended screening tests and vaccinations. We discussed a need for adhering to healthy diet and exercise. Labs/EKG were reviewed/ordered. All questions were answered.   

## 2010-08-24 NOTE — Assessment & Plan Note (Signed)
On Rx 

## 2010-08-24 NOTE — Assessment & Plan Note (Signed)
Worse lately. Weaning off. No diazepam w/ETOH.

## 2010-09-11 ENCOUNTER — Encounter: Payer: Self-pay | Admitting: Gastroenterology

## 2010-10-01 ENCOUNTER — Encounter: Payer: Self-pay | Admitting: Gastroenterology

## 2010-10-01 ENCOUNTER — Ambulatory Visit (AMBULATORY_SURGERY_CENTER): Payer: BC Managed Care – HMO | Admitting: *Deleted

## 2010-10-01 VITALS — Ht 71.0 in | Wt 167.2 lb

## 2010-10-01 DIAGNOSIS — Z1211 Encounter for screening for malignant neoplasm of colon: Secondary | ICD-10-CM

## 2010-10-01 MED ORDER — PEG-KCL-NACL-NASULF-NA ASC-C 100 G PO SOLR: ORAL | Status: DC

## 2010-10-15 ENCOUNTER — Other Ambulatory Visit: Payer: BC Managed Care – HMO | Admitting: Gastroenterology

## 2011-02-19 ENCOUNTER — Telehealth: Payer: Self-pay | Admitting: *Deleted

## 2011-02-19 MED ORDER — DIAZEPAM 5 MG PO TABS: 5.0000 mg | ORAL_TABLET | Freq: Two times a day (BID) | ORAL | Status: DC | PRN

## 2011-02-19 NOTE — Telephone Encounter (Signed)
Ok to fill per AVP- X 5

## 2011-02-19 NOTE — Telephone Encounter (Signed)
Rf req for Diazepam 5mg   1/2- 1 po bid anxiety. # 60. Last filled 02-08-2011. Ok to Rf?

## 2011-08-03 ENCOUNTER — Telehealth: Payer: Self-pay | Admitting: *Deleted

## 2011-08-03 MED ORDER — LOTEPREDNOL ETABONATE 0.2 % OP SUSP: 1.0000 [drp] | Freq: Four times a day (QID) | OPHTHALMIC | Status: DC

## 2011-08-03 NOTE — Telephone Encounter (Signed)
Rf req for Alrex 0.2% eye drops. Instill 1 drop in left eye qid. Please advise ok to rf? Med is not active on med list.

## 2011-08-03 NOTE — Telephone Encounter (Signed)
Done

## 2011-08-03 NOTE — Telephone Encounter (Signed)
OK to fill this prescription with additional refills x5 Thank you!  

## 2011-08-26 ENCOUNTER — Other Ambulatory Visit: Payer: Self-pay | Admitting: Internal Medicine

## 2011-10-24 ENCOUNTER — Other Ambulatory Visit: Payer: Self-pay | Admitting: Internal Medicine

## 2011-11-10 ENCOUNTER — Telehealth: Payer: Self-pay | Admitting: *Deleted

## 2011-11-10 DIAGNOSIS — Z0000 Encounter for general adult medical examination without abnormal findings: Secondary | ICD-10-CM

## 2011-11-10 DIAGNOSIS — Z0389 Encounter for observation for other suspected diseases and conditions ruled out: Secondary | ICD-10-CM

## 2011-11-10 NOTE — Telephone Encounter (Signed)
CPE labs entered.  

## 2011-11-10 NOTE — Telephone Encounter (Signed)
Message copied by Merrilyn Puma on Wed Nov 10, 2011  9:15 AM ------      Message from: COUSIN, [place] T      Created: Fri Nov 05, 2011  9:47 AM      Regarding: PHY DATE   02/02/12       THANKS

## 2011-11-20 ENCOUNTER — Other Ambulatory Visit: Payer: Self-pay | Admitting: Internal Medicine

## 2011-12-14 ENCOUNTER — Other Ambulatory Visit: Payer: Self-pay | Admitting: Internal Medicine

## 2012-01-27 ENCOUNTER — Other Ambulatory Visit (INDEPENDENT_AMBULATORY_CARE_PROVIDER_SITE_OTHER): Payer: BC Managed Care – HMO

## 2012-01-27 DIAGNOSIS — Z0389 Encounter for observation for other suspected diseases and conditions ruled out: Secondary | ICD-10-CM

## 2012-01-27 DIAGNOSIS — Z0000 Encounter for general adult medical examination without abnormal findings: Secondary | ICD-10-CM

## 2012-01-27 LAB — CBC WITH DIFFERENTIAL/PLATELET
Basophils Absolute: 0 10*3/uL (ref 0.0–0.1)
Eosinophils Absolute: 0.1 10*3/uL (ref 0.0–0.7)
Hemoglobin: 16.5 g/dL (ref 13.0–17.0)
Lymphocytes Relative: 43.6 % (ref 12.0–46.0)
MCHC: 33.2 g/dL (ref 30.0–36.0)
Neutro Abs: 6.4 10*3/uL (ref 1.4–7.7)
Platelets: 221 10*3/uL (ref 150.0–400.0)
RDW: 13.1 % (ref 11.5–14.6)

## 2012-01-27 LAB — HEPATIC FUNCTION PANEL
ALT: 25 U/L (ref 0–53)
AST: 28 U/L (ref 0–37)
Albumin: 4 g/dL (ref 3.5–5.2)
Total Protein: 7 g/dL (ref 6.0–8.3)

## 2012-01-27 LAB — URINALYSIS, ROUTINE W REFLEX MICROSCOPIC
Bilirubin Urine: NEGATIVE
Hgb urine dipstick: NEGATIVE
Ketones, ur: NEGATIVE
Leukocytes, UA: NEGATIVE
Urobilinogen, UA: 0.2 (ref 0.0–1.0)

## 2012-01-27 LAB — BASIC METABOLIC PANEL
BUN: 10 mg/dL (ref 6–23)
Chloride: 101 mEq/L (ref 96–112)
Creatinine, Ser: 0.6 mg/dL (ref 0.4–1.5)
Glucose, Bld: 95 mg/dL (ref 70–99)
Potassium: 4.7 mEq/L (ref 3.5–5.1)

## 2012-01-27 LAB — PSA: PSA: 0.21 ng/mL (ref 0.10–4.00)

## 2012-01-27 LAB — LIPID PANEL: HDL: 60.1 mg/dL (ref >39.00)

## 2012-02-02 ENCOUNTER — Ambulatory Visit (INDEPENDENT_AMBULATORY_CARE_PROVIDER_SITE_OTHER): Payer: BC Managed Care – HMO | Admitting: Internal Medicine

## 2012-02-02 ENCOUNTER — Encounter: Payer: Self-pay | Admitting: Internal Medicine

## 2012-02-02 VITALS — BP 130/90 | HR 74 | Temp 98.4°F | Wt 164.0 lb

## 2012-02-02 DIAGNOSIS — Z23 Encounter for immunization: Secondary | ICD-10-CM

## 2012-02-02 DIAGNOSIS — J449 Chronic obstructive pulmonary disease, unspecified: Secondary | ICD-10-CM

## 2012-02-02 DIAGNOSIS — L538 Other specified erythematous conditions: Secondary | ICD-10-CM

## 2012-02-02 DIAGNOSIS — L304 Erythema intertrigo: Secondary | ICD-10-CM

## 2012-02-02 DIAGNOSIS — Z0000 Encounter for general adult medical examination without abnormal findings: Secondary | ICD-10-CM

## 2012-02-02 DIAGNOSIS — I10 Essential (primary) hypertension: Secondary | ICD-10-CM

## 2012-02-02 DIAGNOSIS — F17200 Nicotine dependence, unspecified, uncomplicated: Secondary | ICD-10-CM

## 2012-02-02 DIAGNOSIS — E785 Hyperlipidemia, unspecified: Secondary | ICD-10-CM

## 2012-02-02 DIAGNOSIS — K219 Gastro-esophageal reflux disease without esophagitis: Secondary | ICD-10-CM

## 2012-02-02 HISTORY — DX: Erythema intertrigo: L30.4

## 2012-02-02 MED ORDER — LOVASTATIN 20 MG PO TABS: 20.0000 mg | ORAL_TABLET | Freq: Every day | ORAL | Status: DC

## 2012-02-02 MED ORDER — DIAZEPAM 5 MG PO TABS: 5.0000 mg | ORAL_TABLET | Freq: Two times a day (BID) | ORAL | Status: DC | PRN

## 2012-02-02 MED ORDER — AMLODIPINE BESYLATE 5 MG PO TABS: 5.0000 mg | ORAL_TABLET | Freq: Every day | ORAL | Status: DC

## 2012-02-02 MED ORDER — CLOTRIMAZOLE-BETAMETHASONE 1-0.05 % EX CREA: TOPICAL_CREAM | Freq: Two times a day (BID) | CUTANEOUS | Status: DC

## 2012-02-02 MED ORDER — FINASTERIDE 5 MG PO TABS: 5.0000 mg | ORAL_TABLET | Freq: Every day | ORAL | Status: DC

## 2012-02-02 NOTE — Assessment & Plan Note (Signed)
Lotrisone bid  

## 2012-02-02 NOTE — Assessment & Plan Note (Signed)
Continue with current prescription therapy as reflected on the Med list.  

## 2012-02-02 NOTE — Progress Notes (Signed)
Subjective:    Patient ID: Joe [name], male    DOB: 1945/11/15, 66 y.o.   MRN: [id number]  HPI  The patient is here for a wellness exam. The patient has been doing well overall without major physical or psychological issues going on lately. The patient needs to address  chronic hypertension that has been well controlled with medicines; to address chronic  hyperlipidemia controlled with medical treatment and diet. He is retiring in 1/14  C/o groin rash x 2-3 wks  Wt Readings from Last 3 Encounters:  02/02/12 164 lb (74.39 kg)  10/01/10 167 lb 3.2 oz (75.841 kg)  08/24/10 168 lb (76.204 kg)   BP Readings from Last 3 Encounters:  02/02/12 130/90  08/24/10 142/90  05/14/09 140/84      Review of Systems  Constitutional: Positive for fatigue. Negative for appetite change and unexpected weight change.  HENT: Negative for hearing loss, nosebleeds, congestion, sore throat, sneezing, trouble swallowing and neck pain.   Eyes: Negative for redness, itching and visual disturbance.  Respiratory: Negative for cough and stridor.   Cardiovascular: Negative for chest pain, palpitations and leg swelling.  Gastrointestinal: Negative for nausea, diarrhea, blood in stool and abdominal distention.  Genitourinary: Positive for decreased urine volume and difficulty urinating. Negative for frequency, hematuria, penile pain and testicular pain.  Musculoskeletal: Positive for back pain. Negative for joint swelling and gait problem.  Skin: Negative for rash.  Neurological: Positive for weakness. Negative for dizziness, tremors and speech difficulty.  Psychiatric/Behavioral: Negative for sleep disturbance, dysphoric mood and agitation. The patient is nervous/anxious. The patient is not hyperactive.        Objective:   Physical Exam  Constitutional: He is oriented to person, place, and time. He appears well-developed and well-nourished. No distress.  HENT:  Head: Normocephalic and atraumatic.    Right Ear: External ear normal.  Left Ear: External ear normal.  Nose: Nose normal.  Mouth/Throat: Oropharynx is clear and moist. No oropharyngeal exudate.  Eyes: Conjunctivae normal and EOM are normal. Pupils are equal, round, and reactive to light. Right eye exhibits no discharge. Left eye exhibits no discharge. No scleral icterus.  Neck: Normal range of motion. Neck supple. No JVD present. No tracheal deviation present. No thyromegaly present.  Cardiovascular: Normal rate, regular rhythm, normal heart sounds and intact distal pulses.  Exam reveals no gallop and no friction rub.   No murmur heard. Pulmonary/Chest: Effort normal and breath sounds normal. No stridor. No respiratory distress. He has no wheezes. He has no rales. He exhibits no tenderness.  Abdominal: Soft. Bowel sounds are normal. He exhibits no distension and no mass. There is no tenderness. There is no rebound and no guarding.  Genitourinary: Rectum normal and penis normal. Guaiac negative stool. No penile tenderness.       Prostate 2+  Musculoskeletal: Normal range of motion. He exhibits no edema and no tenderness.  Lymphadenopathy:    He has no cervical adenopathy.  Neurological: He is alert and oriented to person, place, and time. He has normal reflexes. No cranial nerve deficit. He exhibits normal muscle tone. Coordination normal.  Skin: Skin is warm and dry. Rash noted. He is not diaphoretic. No erythema. No pallor.  Psychiatric: He has a normal mood and affect. His behavior is normal. Judgment and thought content normal.  Cane, limp Penile eryth rash     Lab Results  Component Value Date   WBC 13.8* 01/27/2012   HGB 16.5 01/27/2012   HCT 49.8 01/27/2012  PLT 221.0 01/27/2012   GLUCOSE 95 01/27/2012   CHOL 166 01/27/2012   TRIG 96.0 01/27/2012   HDL 60.10 01/27/2012   LDLDIRECT 109.1 07/20/2007   LDLCALC 87 01/27/2012   ALT 25 01/27/2012   AST 28 01/27/2012   NA 138 01/27/2012   K 4.7 01/27/2012   CL 101 01/27/2012    CREATININE 0.6 01/27/2012   BUN 10 01/27/2012   CO2 30 01/27/2012   TSH 1.46 01/27/2012   PSA 0.21 01/27/2012      Assessment & Plan:

## 2012-02-02 NOTE — Assessment & Plan Note (Signed)
BP Readings from Last 3 Encounters:  02/02/12 130/90  08/24/10 142/90  05/14/09 140/84

## 2012-02-02 NOTE — Assessment & Plan Note (Addendum)
We discussed age appropriate health related issues, including available/recomended screening tests and vaccinations. We discussed a need for adhering to healthy diet and exercise. Labs/EKG were reviewed/ordered. All questions were answered. Zostavax advised Colonoscopy adviced He needs to stop smoking and drinking.

## 2012-02-02 NOTE — Assessment & Plan Note (Signed)
Discussed.

## 2012-05-05 ENCOUNTER — Ambulatory Visit: Payer: BC Managed Care – HMO | Admitting: Internal Medicine

## 2012-09-07 ENCOUNTER — Ambulatory Visit (INDEPENDENT_AMBULATORY_CARE_PROVIDER_SITE_OTHER): Payer: Medicare Other | Admitting: Internal Medicine

## 2012-09-07 ENCOUNTER — Encounter: Payer: Self-pay | Admitting: Internal Medicine

## 2012-09-07 VITALS — BP 150/88 | HR 64 | Temp 97.4°F | Resp 16 | Wt 168.0 lb

## 2012-09-07 DIAGNOSIS — J4489 Other specified chronic obstructive pulmonary disease: Secondary | ICD-10-CM

## 2012-09-07 DIAGNOSIS — I10 Essential (primary) hypertension: Secondary | ICD-10-CM

## 2012-09-07 DIAGNOSIS — F1010 Alcohol abuse, uncomplicated: Secondary | ICD-10-CM

## 2012-09-07 DIAGNOSIS — F411 Generalized anxiety disorder: Secondary | ICD-10-CM

## 2012-09-07 DIAGNOSIS — J449 Chronic obstructive pulmonary disease, unspecified: Secondary | ICD-10-CM

## 2012-09-07 DIAGNOSIS — F17200 Nicotine dependence, unspecified, uncomplicated: Secondary | ICD-10-CM

## 2012-09-07 DIAGNOSIS — L304 Erythema intertrigo: Secondary | ICD-10-CM

## 2012-09-07 DIAGNOSIS — L538 Other specified erythematous conditions: Secondary | ICD-10-CM

## 2012-09-07 DIAGNOSIS — E785 Hyperlipidemia, unspecified: Secondary | ICD-10-CM

## 2012-09-07 HISTORY — DX: Generalized anxiety disorder: F41.1

## 2012-09-07 NOTE — Assessment & Plan Note (Signed)
Smoker - stopped 6/14 

## 2012-09-07 NOTE — Assessment & Plan Note (Signed)
Continue with current prescription therapy as reflected on the Med list.  

## 2012-09-07 NOTE — Assessment & Plan Note (Signed)
Resolved

## 2012-09-07 NOTE — Assessment & Plan Note (Signed)
stopped 6/14

## 2012-09-07 NOTE — Assessment & Plan Note (Signed)
Smoker - stopped 6/14

## 2012-09-07 NOTE — Progress Notes (Signed)
Subjective:     HPI   The patient needs to address  chronic hypertension that has been well controlled with medicines; to address chronic  hyperlipidemia controlled with medical treatment and diet. He retired in 1/14  Smoker - stopped 6/14  F/u groin rash - resolved  Wt Readings from Last 3 Encounters:  09/07/12 168 lb (76.204 kg)  02/02/12 164 lb (74.39 kg)  10/01/10 167 lb 3.2 oz (75.841 kg)   BP Readings from Last 3 Encounters:  09/07/12 150/88  02/02/12 130/90  08/24/10 142/90      Review of Systems  Constitutional: Positive for fatigue. Negative for appetite change and unexpected weight change.  HENT: Negative for hearing loss, nosebleeds, congestion, sore throat, sneezing, trouble swallowing and neck pain.   Eyes: Negative for redness, itching and visual disturbance.  Respiratory: Negative for cough and stridor.   Cardiovascular: Negative for chest pain, palpitations and leg swelling.  Gastrointestinal: Negative for nausea, diarrhea, blood in stool and abdominal distention.  Genitourinary: Positive for decreased urine volume and difficulty urinating. Negative for frequency, hematuria, penile pain and testicular pain.  Musculoskeletal: Positive for back pain. Negative for joint swelling and gait problem.  Skin: Negative for rash.  Neurological: Positive for weakness. Negative for dizziness, tremors and speech difficulty.  Psychiatric/Behavioral: Negative for sleep disturbance, dysphoric mood and agitation. The patient is nervous/anxious. The patient is not hyperactive.        Objective:   Physical Exam  Constitutional: He is oriented to person, place, and time. He appears well-developed and well-nourished. No distress.  HENT:  Head: Normocephalic and atraumatic.  Right Ear: External ear normal.  Left Ear: External ear normal.  Nose: Nose normal.  Mouth/Throat: Oropharynx is clear and moist. No oropharyngeal exudate.  Eyes: Conjunctivae and EOM are normal.  Pupils are equal, round, and reactive to light. Right eye exhibits no discharge. Left eye exhibits no discharge. No scleral icterus.  Neck: Normal range of motion. Neck supple. No JVD present. No tracheal deviation present. No thyromegaly present.  Cardiovascular: Normal rate, regular rhythm, normal heart sounds and intact distal pulses.  Exam reveals no gallop and no friction rub.   No murmur heard. Pulmonary/Chest: Effort normal and breath sounds normal. No stridor. No respiratory distress. He has no wheezes. He has no rales. He exhibits no tenderness.  Abdominal: Soft. Bowel sounds are normal. He exhibits no distension and no mass. There is no tenderness. There is no rebound and no guarding.  Genitourinary: Rectum normal and penis normal. Guaiac negative stool. No penile tenderness.  Prostate 2+  Musculoskeletal: Normal range of motion. He exhibits no edema and no tenderness.  Lymphadenopathy:    He has no cervical adenopathy.  Neurological: He is alert and oriented to person, place, and time. He has normal reflexes. No cranial nerve deficit. He exhibits normal muscle tone. Coordination normal.  Skin: Skin is warm and dry. Rash noted. He is not diaphoretic. No erythema. No pallor.  Psychiatric: He has a normal mood and affect. His behavior is normal. Judgment and thought content normal.  Cane, limp      Lab Results  Component Value Date   WBC 13.8* 01/27/2012   HGB 16.5 01/27/2012   HCT 49.8 01/27/2012   PLT 221.0 01/27/2012   GLUCOSE 95 01/27/2012   CHOL 166 01/27/2012   TRIG 96.0 01/27/2012   HDL 60.10 01/27/2012   LDLDIRECT 109.1 07/20/2007   LDLCALC 87 01/27/2012   ALT 25 01/27/2012   AST 28 01/27/2012  NA 138 01/27/2012   K 4.7 01/27/2012   CL 101 01/27/2012   CREATININE 0.6 01/27/2012   BUN 10 01/27/2012   CO2 30 01/27/2012   TSH 1.46 01/27/2012   PSA 0.21 01/27/2012      Assessment & Plan:

## 2012-09-07 NOTE — Assessment & Plan Note (Signed)
Continue with current prescription prn therapy as reflected on the Med list.  

## 2012-12-13 ENCOUNTER — Ambulatory Visit (INDEPENDENT_AMBULATORY_CARE_PROVIDER_SITE_OTHER): Payer: Medicare Other | Admitting: *Deleted

## 2012-12-13 DIAGNOSIS — Z23 Encounter for immunization: Secondary | ICD-10-CM | POA: Diagnosis not present

## 2012-12-19 ENCOUNTER — Ambulatory Visit: Payer: BC Managed Care – PPO

## 2013-02-01 ENCOUNTER — Other Ambulatory Visit: Payer: Self-pay | Admitting: Internal Medicine

## 2013-03-12 ENCOUNTER — Ambulatory Visit (INDEPENDENT_AMBULATORY_CARE_PROVIDER_SITE_OTHER): Payer: Medicare Other | Admitting: Internal Medicine

## 2013-03-12 ENCOUNTER — Other Ambulatory Visit (INDEPENDENT_AMBULATORY_CARE_PROVIDER_SITE_OTHER): Payer: Medicare Other

## 2013-03-12 ENCOUNTER — Encounter: Payer: Self-pay | Admitting: Internal Medicine

## 2013-03-12 VITALS — BP 132/82 | HR 72 | Temp 97.2°F | Resp 16 | Ht 71.0 in | Wt 182.0 lb

## 2013-03-12 DIAGNOSIS — R61 Generalized hyperhidrosis: Secondary | ICD-10-CM

## 2013-03-12 DIAGNOSIS — I10 Essential (primary) hypertension: Secondary | ICD-10-CM

## 2013-03-12 DIAGNOSIS — F17200 Nicotine dependence, unspecified, uncomplicated: Secondary | ICD-10-CM

## 2013-03-12 DIAGNOSIS — N320 Bladder-neck obstruction: Secondary | ICD-10-CM

## 2013-03-12 DIAGNOSIS — Z0000 Encounter for general adult medical examination without abnormal findings: Secondary | ICD-10-CM

## 2013-03-12 DIAGNOSIS — Z136 Encounter for screening for cardiovascular disorders: Secondary | ICD-10-CM

## 2013-03-12 DIAGNOSIS — F1010 Alcohol abuse, uncomplicated: Secondary | ICD-10-CM

## 2013-03-12 DIAGNOSIS — I4892 Unspecified atrial flutter: Secondary | ICD-10-CM

## 2013-03-12 DIAGNOSIS — R202 Paresthesia of skin: Secondary | ICD-10-CM

## 2013-03-12 DIAGNOSIS — F411 Generalized anxiety disorder: Secondary | ICD-10-CM

## 2013-03-12 DIAGNOSIS — D485 Neoplasm of uncertain behavior of skin: Secondary | ICD-10-CM

## 2013-03-12 DIAGNOSIS — J4489 Other specified chronic obstructive pulmonary disease: Secondary | ICD-10-CM

## 2013-03-12 DIAGNOSIS — J449 Chronic obstructive pulmonary disease, unspecified: Secondary | ICD-10-CM

## 2013-03-12 DIAGNOSIS — Z23 Encounter for immunization: Secondary | ICD-10-CM

## 2013-03-12 DIAGNOSIS — Z125 Encounter for screening for malignant neoplasm of prostate: Secondary | ICD-10-CM

## 2013-03-12 DIAGNOSIS — R209 Unspecified disturbances of skin sensation: Secondary | ICD-10-CM

## 2013-03-12 HISTORY — DX: Neoplasm of uncertain behavior of skin: D48.5

## 2013-03-12 LAB — LIPID PANEL
HDL: 54.6 mg/dL (ref >39.00)
Total CHOL/HDL Ratio: 4
VLDL: 50.8 mg/dL — ABNORMAL HIGH (ref 0.0–40.0)

## 2013-03-12 LAB — TESTOSTERONE: Testosterone: 156.48 ng/dL — ABNORMAL LOW (ref 350.00–890.00)

## 2013-03-12 LAB — LDL CHOLESTEROL, DIRECT: Direct LDL: 133.9 mg/dL

## 2013-03-12 LAB — URINALYSIS
Ketones, ur: NEGATIVE
Leukocytes, UA: NEGATIVE
Specific Gravity, Urine: 1.015 (ref 1.000–1.030)
Urine Glucose: NEGATIVE
Urobilinogen, UA: 0.2 (ref 0.0–1.0)
pH: 6 (ref 5.0–8.0)

## 2013-03-12 LAB — CBC WITH DIFFERENTIAL/PLATELET
Eosinophils Relative: 1.3 % (ref 0.0–5.0)
HCT: 45.4 % (ref 39.0–52.0)
Hemoglobin: 15.1 g/dL (ref 13.0–17.0)
Lymphocytes Relative: 41.2 % (ref 12.0–46.0)
Lymphs Abs: 6.2 10*3/uL — ABNORMAL HIGH (ref 0.7–4.0)
Monocytes Relative: 7.3 % (ref 3.0–12.0)
Neutro Abs: 7.5 10*3/uL (ref 1.4–7.7)
Neutrophils Relative %: 49.8 % (ref 43.0–77.0)
Platelets: 248 10*3/uL (ref 150.0–400.0)
RBC: 5.2 Mil/uL (ref 4.22–5.81)
WBC: 15.1 10*3/uL — ABNORMAL HIGH (ref 4.5–10.5)

## 2013-03-12 LAB — BASIC METABOLIC PANEL
CO2: 29 mEq/L (ref 19–32)
Calcium: 10 mg/dL (ref 8.4–10.5)
Creatinine, Ser: 0.6 mg/dL (ref 0.4–1.5)
GFR: 132.58 mL/min (ref >60.00)
Potassium: 4.4 mEq/L (ref 3.5–5.1)
Sodium: 138 mEq/L (ref 135–145)

## 2013-03-12 LAB — TSH: TSH: 2.77 u[IU]/mL (ref 0.35–5.50)

## 2013-03-12 LAB — HEPATIC FUNCTION PANEL
ALT: 33 U/L (ref 0–53)
AST: 32 U/L (ref 0–37)
Albumin: 4.5 g/dL (ref 3.5–5.2)
Total Bilirubin: 1.8 mg/dL — ABNORMAL HIGH (ref 0.3–1.2)

## 2013-03-12 LAB — VITAMIN B12: Vitamin B-12: 595 pg/mL (ref 211–911)

## 2013-03-12 MED ORDER — ASPIRIN 325 MG PO TABS: 325.0000 mg | ORAL_TABLET | Freq: Every day | ORAL | Status: DC

## 2013-03-12 NOTE — Assessment & Plan Note (Addendum)
Here for medicare wellness/physical  Diet: heart healthy  Physical activity: sedentary  Depression/mood screen: negative  Hearing: intact to whispered voice  Visual acuity: grossly normal, performs annual eye exam  ADLs: capable  Fall risk: none  Home safety: good  Cognitive evaluation: intact to orientation, naming, recall and repetition  EOL planning: adv directives, full code/ I agree  I have personally reviewed and have noted  1. The patient's medical and social history  2. Their use of alcohol, tobacco or illicit drugs  3. Their current medications and supplements  4. The patient's functional ability including ADL's, fall risks, home safety risks and hearing or visual impairment.  5. Diet and physical activities  6. Evidence for depression or mood disorders    Today patient counseled on age appropriate routine health concerns for screening and prevention, each reviewed and up to date or declined. Immunizations reviewed and up to date or declined. Labs ordered and reviewed. Risk factors for depression reviewed and negative. Hearing function and visual acuity are intact. ADLs screened and addressed as needed. Functional ability and level of safety reviewed and appropriate. Education, counseling and referrals performed based on assessed risks today. Patient provided with a copy of personalized plan for preventive services.  Loose wt Prevnar Declined colon this year

## 2013-03-12 NOTE — Assessment & Plan Note (Signed)
Labs incl testosterone ?etiol

## 2013-03-12 NOTE — Assessment & Plan Note (Signed)
12.14 - new Start a full ASA Card consult

## 2013-03-12 NOTE — Assessment & Plan Note (Signed)
Doing well 

## 2013-03-12 NOTE — Assessment & Plan Note (Signed)
12/14 R ear, chest

## 2013-03-12 NOTE — Progress Notes (Signed)
Subjective:     HPI  The patient is here for a wellness exam. The patient has been doing well overall without major physical or psychological issues going on lately. The patient needs to address  chronic hypertension that has been well controlled with medicines; to address chronic  hyperlipidemia controlled with medical treatment and diet. He is retired since 1/14 C/o occ night sweats    Wt Readings from Last 3 Encounters:  03/12/13 182 lb (82.555 kg)  09/07/12 168 lb (76.204 kg)  02/02/12 164 lb (74.39 kg)   BP Readings from Last 3 Encounters:  03/12/13 132/82  09/07/12 150/88  02/02/12 130/90      Review of Systems  Constitutional: Positive for fatigue. Negative for appetite change and unexpected weight change.  HENT: Negative for congestion, hearing loss, nosebleeds, sneezing, sore throat and trouble swallowing.   Eyes: Negative for redness, itching and visual disturbance.  Respiratory: Negative for cough and stridor.   Cardiovascular: Negative for chest pain, palpitations and leg swelling.  Gastrointestinal: Negative for nausea, diarrhea, blood in stool and abdominal distention.  Genitourinary: Positive for decreased urine volume and difficulty urinating. Negative for frequency, hematuria, penile pain and testicular pain.  Musculoskeletal: Positive for back pain. Negative for gait problem, joint swelling and neck pain.  Skin: Negative for rash.  Neurological: Positive for weakness. Negative for dizziness, tremors and speech difficulty.  Psychiatric/Behavioral: Negative for sleep disturbance, dysphoric mood and agitation. The patient is nervous/anxious. The patient is not hyperactive.        Objective:   Physical Exam  Constitutional: He is oriented to person, place, and time. He appears well-developed and well-nourished. No distress.  HENT:  Head: Normocephalic and atraumatic.  Right Ear: External ear normal.  Left Ear: External ear normal.  Nose: Nose normal.   Mouth/Throat: Oropharynx is clear and moist. No oropharyngeal exudate.  Eyes: Conjunctivae and EOM are normal. Pupils are equal, round, and reactive to light. Right eye exhibits no discharge. Left eye exhibits no discharge. No scleral icterus.  Neck: Normal range of motion. Neck supple. No JVD present. No tracheal deviation present. No thyromegaly present.  Cardiovascular: Normal rate, regular rhythm, normal heart sounds and intact distal pulses.  Exam reveals no gallop and no friction rub.   No murmur heard. Pulmonary/Chest: Effort normal and breath sounds normal. No stridor. No respiratory distress. He has no wheezes. He has no rales. He exhibits no tenderness.  Abdominal: Soft. Bowel sounds are normal. He exhibits no distension and no mass. There is no tenderness. There is no rebound and no guarding.  Genitourinary: Rectum normal and penis normal. Guaiac negative stool. No penile tenderness.  Prostate 2+  Musculoskeletal: Normal range of motion. He exhibits no edema and no tenderness.  Lymphadenopathy:    He has no cervical adenopathy.  Neurological: He is alert and oriented to person, place, and time. He has normal reflexes. No cranial nerve deficit. He exhibits normal muscle tone. Coordination normal.  Skin: Skin is warm and dry. Rash noted. He is not diaphoretic. No erythema. No pallor.  Psychiatric: He has a normal mood and affect. His behavior is normal. Judgment and thought content normal.  Cane, limp Mole on chest and ear     Lab Results  Component Value Date   WBC 13.8* 01/27/2012   HGB 16.5 01/27/2012   HCT 49.8 01/27/2012   PLT 221.0 01/27/2012   GLUCOSE 95 01/27/2012   CHOL 166 01/27/2012   TRIG 96.0 01/27/2012   HDL 60.10 01/27/2012  LDLDIRECT 109.1 07/20/2007   LDLCALC 87 01/27/2012   ALT 25 01/27/2012   AST 28 01/27/2012   NA 138 01/27/2012   K 4.7 01/27/2012   CL 101 01/27/2012   CREATININE 0.6 01/27/2012   BUN 10 01/27/2012   CO2 30 01/27/2012   TSH 1.46 01/27/2012   PSA  0.21 01/27/2012      Assessment & Plan:

## 2013-03-12 NOTE — Assessment & Plan Note (Signed)
Not smoking.  

## 2013-03-12 NOTE — Assessment & Plan Note (Signed)
Sporadic.

## 2013-03-12 NOTE — Assessment & Plan Note (Signed)
Continue with current prescription therapy as reflected on the Med list.  

## 2013-03-12 NOTE — Progress Notes (Signed)
Pre visit review using our clinic review tool, if applicable. No additional management support is needed unless otherwise documented below in the visit note. 

## 2013-03-12 NOTE — Patient Instructions (Signed)
   Milk free trial (no milk, ice cream, cheese and yogurt) for 4-6 weeks. OK to use almond, coconut, rice milk. "Almond breeze" brand tastes good.  

## 2013-03-27 ENCOUNTER — Encounter: Payer: Self-pay | Admitting: Cardiology

## 2013-03-27 ENCOUNTER — Encounter (INDEPENDENT_AMBULATORY_CARE_PROVIDER_SITE_OTHER): Payer: Medicare Other

## 2013-03-27 ENCOUNTER — Encounter: Payer: Self-pay | Admitting: Radiology

## 2013-03-27 ENCOUNTER — Ambulatory Visit (INDEPENDENT_AMBULATORY_CARE_PROVIDER_SITE_OTHER): Payer: Medicare Other | Admitting: Cardiology

## 2013-03-27 VITALS — BP 138/84 | HR 68 | Ht 71.0 in | Wt 185.0 lb

## 2013-03-27 DIAGNOSIS — I4892 Unspecified atrial flutter: Secondary | ICD-10-CM

## 2013-03-27 DIAGNOSIS — R002 Palpitations: Secondary | ICD-10-CM | POA: Diagnosis not present

## 2013-03-27 DIAGNOSIS — I10 Essential (primary) hypertension: Secondary | ICD-10-CM

## 2013-03-27 NOTE — Patient Instructions (Signed)
Your physician recommends that you continue on your current medications as directed. Please refer to the Current Medication list given to you today.  Your physician has recommended that you wear an event monitor. Event monitors are medical devices that record the heart's electrical activity. Doctors most often [place] these monitors to diagnose arrhythmias. Arrhythmias are problems with the speed or rhythm of the heartbeat. The monitor is a small, portable device. You can wear one while you do your normal daily activities. This is usually used to diagnose what is causing palpitations/syncope (passing out).  Your physician recommends that you schedule a follow-up appointment as needed

## 2013-03-27 NOTE — Progress Notes (Signed)
Patient ID: Joe [name], male   DOB: 1945/04/11, 68 y.o.   MRN: [id number] [employer] 30 day monitor applied

## 2013-03-27 NOTE — Progress Notes (Signed)
[address], [place], [place]  [address] Phone: [phone] Fax:  [phone]  Date:  03/27/2013   ID:  Joe [name], DOB [birth year], MRN [id number]  PCP:  Walker Kehr, MD  Cardiologist:  Fransico Him, MD     History of Present Illness: Joe [name] is a 68 y.o. male with a history of HTN was recently seen for routine PE and was noted to be in atrial flutter.  He has never had any cardiac issues in the past.  He denies any chest pain, SOB, DOE, LE edema, dizziness, or syncope.  He says that he has had palpitations about 4 times in 9 years, usually in setting of stress at work.  He has been aware of his heart beat in the past although he denies any palpitations.  He has a history of tobacco abuse and COPD and actually quit smoking last year.  Since quitting smoking he has noticed some mild increase in his SOB.  He was started on ASA by his PCP and is now referred for further evaluation.   Wt Readings from Last 3 Encounters:  03/27/13 185 lb (83.915 kg)  03/12/13 182 lb (82.555 kg)  09/07/12 168 lb (76.204 kg)     Past Medical History  Diagnosis Date  . Hyperlipidemia   . GERD (gastroesophageal reflux disease)   . COPD (chronic obstructive pulmonary disease)   . Hypertension   . 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 Paresis     right- s/p cerv decompression    Current Outpatient Prescriptions  Medication Sig Dispense Refill  . amLODipine (NORVASC) 5 MG tablet Take 1 tablet (5 mg total) by mouth daily.  90 tablet  3  . aspirin (BAYER ASPIRIN) 325 MG tablet Take 1 tablet (325 mg total) by mouth daily.  100 tablet  3  . Cholecalciferol (EQL VITAMIN D3) 1000 UNITS tablet Take 1,000 Units by mouth daily.        . clotrimazole-betamethasone (LOTRISONE) cream Apply topically 2 (two) times daily.  45 g  2  . diazepam (VALIUM) 5 MG tablet Take 1 tablet (5 mg total) by mouth every 12 (twelve) hours as needed for anxiety. 1/2 or 1 po bid prn  60 tablet  5  . loteprednol  (ALREX) 0.2 % SUSP Place 1 drop into the left eye 4 (four) times daily.  10 mL  5  . lovastatin (MEVACOR) 20 MG tablet TAKE 1 TABLET BY MOUTH AT BEDTIME  90 tablet  3  . Prenatal Vit-Fe Fumarate-FA (PRENATAL MULTIVITAMIN) 60-1 MG tablet Take 1 tablet by mouth daily.         No current facility-administered medications for this visit.    Allergies:   No Known Allergies  Social History:  The patient  reports that he quit smoking about 7 months ago. His smoking use included Cigarettes. He smoked 0.80 packs per day. He does not have any smokeless tobacco history on file. He reports that he drinks about 16.8 ounces of alcohol per week. He reports that he does not use illicit drugs.   Family History:  The patient's family history includes COPD in his mother; Coronary artery disease in his other; Diabetes in his father.   ROS:  Please see the history of present illness.      All other systems reviewed and negative.   PHYSICAL EXAM: VS:  BP 138/84  Pulse 68  Ht 5\' 11"  (1.803 m)  Wt 185 lb (83.915 kg)  BMI 25.81 kg/m2 Well nourished, well developed, in no acute distress HEENT: normal Neck: no JVD Cardiac:  normal S1, S2; RRR; no murmur Lungs:  clear to auscultation bilaterally, no wheezing, rhonchi or rales Abd: soft, nontender, no hepatomegaly Ext: no edema Skin: warm and dry Neuro:  CNs 2-12 intact, no focal abnormalities noted  EKG:     NSR  ASSESSMENT AND PLAN:  1. ? atrial flutter/fibrillation per PCP but in review of EKG done 12/22 in the office the EKG reads out as atrial fibrillation but clearly is NSR with no evidence of afib.  There are clear P waves noted.  Since he has had some awareness of his heart beat with vague palpitations that is new I will get an event monitor to make sure he is not having any arrhythmias. 2. HTN well controlled  - continue amlodipine  Followup with me PRN  Signed, Fransico Him, MD 03/27/2013 8:28 AM

## 2013-04-09 ENCOUNTER — Telehealth: Payer: Self-pay | Admitting: Internal Medicine

## 2013-04-09 NOTE — Telephone Encounter (Signed)
Relevant patient education mailed to patient.  

## 2013-04-17 DIAGNOSIS — L820 Inflamed seborrheic keratosis: Secondary | ICD-10-CM | POA: Diagnosis not present

## 2013-04-28 ENCOUNTER — Other Ambulatory Visit: Payer: Self-pay | Admitting: Internal Medicine

## 2013-05-01 ENCOUNTER — Telehealth: Payer: Self-pay | Admitting: Cardiology

## 2013-05-01 ENCOUNTER — Encounter: Payer: Self-pay | Admitting: Cardiology

## 2013-05-01 NOTE — Telephone Encounter (Signed)
Please let patient know that echo showed normal sinus rhythm with occasional extra heart beats from the top and bottom of the heart which are benign

## 2013-05-02 ENCOUNTER — Encounter: Payer: Self-pay | Admitting: Cardiology

## 2013-05-02 NOTE — Telephone Encounter (Signed)
This encounter was created in error - please disregard.

## 2013-05-03 NOTE — Telephone Encounter (Signed)
Pt is aware.  

## 2013-06-12 ENCOUNTER — Ambulatory Visit (INDEPENDENT_AMBULATORY_CARE_PROVIDER_SITE_OTHER): Payer: Medicare Other | Admitting: Internal Medicine

## 2013-06-12 ENCOUNTER — Encounter: Payer: Self-pay | Admitting: Internal Medicine

## 2013-06-12 VITALS — BP 148/90 | HR 76 | Temp 98.0°F | Resp 16 | Wt 184.0 lb

## 2013-06-12 DIAGNOSIS — I4892 Unspecified atrial flutter: Secondary | ICD-10-CM | POA: Diagnosis not present

## 2013-06-12 DIAGNOSIS — E785 Hyperlipidemia, unspecified: Secondary | ICD-10-CM | POA: Diagnosis not present

## 2013-06-12 DIAGNOSIS — I10 Essential (primary) hypertension: Secondary | ICD-10-CM | POA: Diagnosis not present

## 2013-06-12 DIAGNOSIS — F17200 Nicotine dependence, unspecified, uncomplicated: Secondary | ICD-10-CM

## 2013-06-12 DIAGNOSIS — F411 Generalized anxiety disorder: Secondary | ICD-10-CM

## 2013-06-12 DIAGNOSIS — K219 Gastro-esophageal reflux disease without esophagitis: Secondary | ICD-10-CM

## 2013-06-12 DIAGNOSIS — Z23 Encounter for immunization: Secondary | ICD-10-CM

## 2013-06-12 DIAGNOSIS — J449 Chronic obstructive pulmonary disease, unspecified: Secondary | ICD-10-CM

## 2013-06-12 MED ORDER — PNEUMOCOCCAL VAC POLYVALENT 25 MCG/0.5ML IJ INJ: 0.5000 mL | INJECTION | INTRAMUSCULAR | Status: DC

## 2013-06-12 NOTE — Assessment & Plan Note (Signed)
Continue with current prescription therapy as reflected on the Med list.  

## 2013-06-12 NOTE — Assessment & Plan Note (Signed)
Doing well 

## 2013-06-12 NOTE — Assessment & Plan Note (Signed)
Not on MDIs

## 2013-06-12 NOTE — Progress Notes (Signed)
Subjective:     HPI   The patient needs to address  chronic hypertension that has been well controlled with medicines; to address chronic  hyperlipidemia controlled with medical treatment and diet. He retired in 1/14  Smoker - stopped 6/14  F/u groin rash - resolved  Wt Readings from Last 3 Encounters:  06/12/13 184 lb (83.462 kg)  03/27/13 185 lb (83.915 kg)  03/12/13 182 lb (82.555 kg)   BP Readings from Last 3 Encounters:  06/12/13 148/90  03/27/13 138/84  03/12/13 132/82      Review of Systems  Constitutional: Positive for fatigue. Negative for appetite change and unexpected weight change.  HENT: Negative for congestion, hearing loss, nosebleeds, sneezing, sore throat and trouble swallowing.   Eyes: Negative for redness, itching and visual disturbance.  Respiratory: Negative for cough and stridor.   Cardiovascular: Negative for chest pain, palpitations and leg swelling.  Gastrointestinal: Negative for nausea, diarrhea, blood in stool and abdominal distention.  Genitourinary: Positive for decreased urine volume and difficulty urinating. Negative for frequency, hematuria, penile pain and testicular pain.  Musculoskeletal: Positive for back pain. Negative for gait problem, joint swelling and neck pain.  Skin: Negative for rash.  Neurological: Positive for weakness. Negative for dizziness, tremors and speech difficulty.  Psychiatric/Behavioral: Negative for sleep disturbance, dysphoric mood and agitation. The patient is nervous/anxious. The patient is not hyperactive.        Objective:   Physical Exam  Constitutional: He is oriented to person, place, and time. He appears well-developed and well-nourished. No distress.  HENT:  Head: Normocephalic and atraumatic.  Right Ear: External ear normal.  Left Ear: External ear normal.  Nose: Nose normal.  Mouth/Throat: Oropharynx is clear and moist. No oropharyngeal exudate.  Eyes: Conjunctivae and EOM are normal. Pupils  are equal, round, and reactive to light. Right eye exhibits no discharge. Left eye exhibits no discharge. No scleral icterus.  Neck: Normal range of motion. Neck supple. No JVD present. No tracheal deviation present. No thyromegaly present.  Cardiovascular: Normal rate, regular rhythm, normal heart sounds and intact distal pulses.  Exam reveals no gallop and no friction rub.   No murmur heard. Pulmonary/Chest: Effort normal and breath sounds normal. No stridor. No respiratory distress. He has no wheezes. He has no rales. He exhibits no tenderness.  Abdominal: Soft. Bowel sounds are normal. He exhibits no distension and no mass. There is no tenderness. There is no rebound and no guarding.  Genitourinary: Rectum normal and penis normal. Guaiac negative stool. No penile tenderness.  Prostate 2+  Musculoskeletal: Normal range of motion. He exhibits no edema and no tenderness.  Lymphadenopathy:    He has no cervical adenopathy.  Neurological: He is alert and oriented to person, place, and time. He has normal reflexes. No cranial nerve deficit. He exhibits normal muscle tone. Coordination normal.  Skin: Skin is warm and dry. Rash noted. He is not diaphoretic. No erythema. No pallor.  Psychiatric: He has a normal mood and affect. His behavior is normal. Judgment and thought content normal.  Cane, limp      Lab Results  Component Value Date   WBC 15.1* 03/12/2013   HGB 15.1 03/12/2013   HCT 45.4 03/12/2013   PLT 248.0 03/12/2013   GLUCOSE 100* 03/12/2013   CHOL 216* 03/12/2013   TRIG 254.0* 03/12/2013   HDL 54.60 03/12/2013   LDLDIRECT 133.9 03/12/2013   LDLCALC 87 01/27/2012   ALT 33 03/12/2013   AST 32 03/12/2013   NA  138 03/12/2013   K 4.4 03/12/2013   CL 102 03/12/2013   CREATININE 0.6 03/12/2013   BUN 11 03/12/2013   CO2 29 03/12/2013   TSH 2.77 03/12/2013   PSA 0.27 03/12/2013      Assessment & Plan:

## 2013-06-12 NOTE — Assessment & Plan Note (Signed)
Not on meds - resolved

## 2013-06-12 NOTE — Progress Notes (Signed)
Pre visit review using our clinic review tool, if applicable. No additional management support is needed unless otherwise documented below in the visit note. 

## 2013-06-13 ENCOUNTER — Telehealth: Payer: Self-pay | Admitting: Internal Medicine

## 2013-06-13 NOTE — Telephone Encounter (Signed)
Relevant patient education mailed to patient.  

## 2013-10-30 ENCOUNTER — Other Ambulatory Visit: Payer: Self-pay | Admitting: Internal Medicine

## 2013-12-13 ENCOUNTER — Ambulatory Visit (INDEPENDENT_AMBULATORY_CARE_PROVIDER_SITE_OTHER): Payer: Medicare Other | Admitting: Internal Medicine

## 2013-12-13 ENCOUNTER — Encounter: Payer: Self-pay | Admitting: Internal Medicine

## 2013-12-13 ENCOUNTER — Other Ambulatory Visit (INDEPENDENT_AMBULATORY_CARE_PROVIDER_SITE_OTHER): Payer: Medicare Other

## 2013-12-13 VITALS — BP 150/90 | HR 72 | Temp 98.4°F | Resp 16 | Wt 191.0 lb

## 2013-12-13 DIAGNOSIS — M2550 Pain in unspecified joint: Secondary | ICD-10-CM

## 2013-12-13 DIAGNOSIS — M654 Radial styloid tenosynovitis [de Quervain]: Secondary | ICD-10-CM

## 2013-12-13 DIAGNOSIS — R300 Dysuria: Secondary | ICD-10-CM

## 2013-12-13 DIAGNOSIS — N62 Hypertrophy of breast: Secondary | ICD-10-CM

## 2013-12-13 DIAGNOSIS — Z23 Encounter for immunization: Secondary | ICD-10-CM | POA: Diagnosis not present

## 2013-12-13 DIAGNOSIS — I10 Essential (primary) hypertension: Secondary | ICD-10-CM | POA: Diagnosis not present

## 2013-12-13 DIAGNOSIS — F1010 Alcohol abuse, uncomplicated: Secondary | ICD-10-CM

## 2013-12-13 HISTORY — DX: Hypertrophy of breast: N62

## 2013-12-13 HISTORY — DX: Dysuria: R30.0

## 2013-12-13 HISTORY — DX: Pain in unspecified joint: M25.50

## 2013-12-13 HISTORY — DX: Radial styloid tenosynovitis (de quervain): M65.4

## 2013-12-13 LAB — URINALYSIS
Bilirubin Urine: NEGATIVE
Hgb urine dipstick: NEGATIVE
Ketones, ur: NEGATIVE
Leukocytes, UA: NEGATIVE
Nitrite: NEGATIVE
PH: 5.5 (ref 5.0–8.0)
SPECIFIC GRAVITY, URINE: 1.01 (ref 1.000–1.030)
Total Protein, Urine: NEGATIVE
URINE GLUCOSE: NEGATIVE
UROBILINOGEN UA: 0.2 (ref 0.0–1.0)

## 2013-12-13 NOTE — Assessment & Plan Note (Signed)
9/15 Hold Lovastatin x 2 weeks

## 2013-12-13 NOTE — Progress Notes (Signed)
Pre visit review using our clinic review tool, if applicable. No additional management support is needed unless otherwise documented below in the visit note. 

## 2013-12-13 NOTE — Assessment & Plan Note (Addendum)
Benign B 2015, mild Reassured

## 2013-12-13 NOTE — Assessment & Plan Note (Signed)
9/15 - poss stricture Urol ref was offered

## 2013-12-13 NOTE — Progress Notes (Signed)
Subjective:     HPI  C/o pain in the nipples x 4 mo C/o dysuria, intromittent , split stream C/o R wrist pain The patient needs to address  chronic hypertension that has been well controlled with medicines; to address chronic  hyperlipidemia controlled with medical treatment and diet. He retired in 1/14  Smoker - stopped 6/14  F/u groin rash - resolved  Wt Readings from Last 3 Encounters:  12/13/13 191 lb (86.637 kg)  06/12/13 184 lb (83.462 kg)  03/27/13 185 lb (83.915 kg)   BP Readings from Last 3 Encounters:  12/13/13 150/90  06/12/13 148/90  03/27/13 138/84      Review of Systems  Constitutional: Positive for fatigue. Negative for appetite change and unexpected weight change.  HENT: Negative for congestion, hearing loss, nosebleeds, sneezing, sore throat and trouble swallowing.   Eyes: Negative for redness, itching and visual disturbance.  Respiratory: Negative for cough and stridor.   Cardiovascular: Negative for chest pain, palpitations and leg swelling.  Gastrointestinal: Negative for nausea, diarrhea, blood in stool and abdominal distention.  Genitourinary: Positive for decreased urine volume and difficulty urinating. Negative for frequency, hematuria, penile pain and testicular pain.  Musculoskeletal: Positive for back pain. Negative for gait problem, joint swelling and neck pain.  Skin: Negative for rash.  Neurological: Positive for weakness. Negative for dizziness, tremors and speech difficulty.  Psychiatric/Behavioral: Negative for sleep disturbance, dysphoric mood and agitation. The patient is nervous/anxious. The patient is not hyperactive.        Objective:   Physical Exam  Constitutional: He is oriented to person, place, and time. He appears well-developed and well-nourished. No distress.  HENT:  Head: Normocephalic and atraumatic.  Right Ear: External ear normal.  Left Ear: External ear normal.  Nose: Nose normal.  Mouth/Throat: Oropharynx is  clear and moist. No oropharyngeal exudate.  Eyes: Conjunctivae and EOM are normal. Pupils are equal, round, and reactive to light. Right eye exhibits no discharge. Left eye exhibits no discharge. No scleral icterus.  Neck: Normal range of motion. Neck supple. No JVD present. No tracheal deviation present. No thyromegaly present.  Cardiovascular: Normal rate, regular rhythm, normal heart sounds and intact distal pulses.  Exam reveals no gallop and no friction rub.   No murmur heard. Pulmonary/Chest: Effort normal and breath sounds normal. No stridor. No respiratory distress. He has no wheezes. He has no rales. He exhibits no tenderness.  Abdominal: Soft. Bowel sounds are normal. He exhibits no distension and no mass. There is no tenderness. There is no rebound and no guarding.  Genitourinary: Rectum normal and penis normal. Guaiac negative stool. No penile tenderness.  Prostate 2+  Musculoskeletal: Normal range of motion. He exhibits no edema and no tenderness.  Lymphadenopathy:    He has no cervical adenopathy.  Neurological: He is alert and oriented to person, place, and time. He has normal reflexes. No cranial nerve deficit. He exhibits normal muscle tone. Coordination normal.  Skin: Skin is warm and dry. Rash noted. He is not diaphoretic. No erythema. No pallor.  Psychiatric: He has a normal mood and affect. His behavior is normal. Judgment and thought content normal.  Cane R abd poll longus is tender Mild B gynecomastia      Lab Results  Component Value Date   WBC 15.1* 03/12/2013   HGB 15.1 03/12/2013   HCT 45.4 03/12/2013   PLT 248.0 03/12/2013   GLUCOSE 100* 03/12/2013   CHOL 216* 03/12/2013   TRIG 254.0* 03/12/2013   HDL  54.60 03/12/2013   LDLDIRECT 133.9 03/12/2013   LDLCALC 87 01/27/2012   ALT 33 03/12/2013   AST 32 03/12/2013   NA 138 03/12/2013   K 4.4 03/12/2013   CL 102 03/12/2013   CREATININE 0.6 03/12/2013   BUN 11 03/12/2013   CO2 29 03/12/2013   TSH 2.77  03/12/2013   PSA 0.27 03/12/2013      Assessment & Plan:

## 2013-12-13 NOTE — Assessment & Plan Note (Signed)
Stopped 2015

## 2013-12-13 NOTE — Assessment & Plan Note (Signed)
Injection offered

## 2013-12-13 NOTE — Assessment & Plan Note (Signed)
Continue with current prescription therapy as reflected on the Med list.  

## 2013-12-13 NOTE — Patient Instructions (Signed)
Hold Lovastatin x 2 weeks please - see if pains/stiffness is better

## 2014-01-15 DIAGNOSIS — H33321 Round hole, right eye: Secondary | ICD-10-CM | POA: Diagnosis not present

## 2014-01-15 DIAGNOSIS — H40013 Open angle with borderline findings, low risk, bilateral: Secondary | ICD-10-CM | POA: Diagnosis not present

## 2014-01-15 DIAGNOSIS — H2511 Age-related nuclear cataract, right eye: Secondary | ICD-10-CM | POA: Diagnosis not present

## 2014-01-15 DIAGNOSIS — H59812 Chorioretinal scars after surgery for detachment, left eye: Secondary | ICD-10-CM | POA: Diagnosis not present

## 2014-01-15 DIAGNOSIS — H25011 Cortical age-related cataract, right eye: Secondary | ICD-10-CM | POA: Diagnosis not present

## 2014-01-15 DIAGNOSIS — Z961 Presence of intraocular lens: Secondary | ICD-10-CM | POA: Diagnosis not present

## 2014-01-24 ENCOUNTER — Other Ambulatory Visit: Payer: Self-pay | Admitting: Internal Medicine

## 2014-02-16 ENCOUNTER — Other Ambulatory Visit: Payer: Self-pay | Admitting: Internal Medicine

## 2014-03-26 ENCOUNTER — Ambulatory Visit (INDEPENDENT_AMBULATORY_CARE_PROVIDER_SITE_OTHER)
Admission: RE | Admit: 2014-03-26 | Discharge: 2014-03-26 | Disposition: A | Discharge status: Home / Self Care | Payer: Medicare Other | Source: Ambulatory Visit | Attending: Internal Medicine | Admitting: Internal Medicine

## 2014-03-26 ENCOUNTER — Ambulatory Visit (INDEPENDENT_AMBULATORY_CARE_PROVIDER_SITE_OTHER): Payer: Medicare Other | Admitting: Internal Medicine

## 2014-03-26 ENCOUNTER — Encounter: Payer: Self-pay | Admitting: Internal Medicine

## 2014-03-26 VITALS — BP 160/92 | HR 62 | Temp 98.2°F | Wt 193.0 lb

## 2014-03-26 DIAGNOSIS — R0609 Other forms of dyspnea: Secondary | ICD-10-CM | POA: Diagnosis not present

## 2014-03-26 DIAGNOSIS — R0602 Shortness of breath: Secondary | ICD-10-CM | POA: Diagnosis not present

## 2014-03-26 DIAGNOSIS — I10 Essential (primary) hypertension: Secondary | ICD-10-CM | POA: Diagnosis not present

## 2014-03-26 DIAGNOSIS — I4892 Unspecified atrial flutter: Secondary | ICD-10-CM

## 2014-03-26 DIAGNOSIS — M2550 Pain in unspecified joint: Secondary | ICD-10-CM | POA: Diagnosis not present

## 2014-03-26 NOTE — Assessment & Plan Note (Signed)
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

## 2014-03-26 NOTE — Assessment & Plan Note (Signed)
In NSR now Continue with current prescription therapy as reflected on the Med list.

## 2014-03-26 NOTE — Assessment & Plan Note (Signed)
Continue with current prescription therapy as reflected on the Med list.  

## 2014-03-26 NOTE — Assessment & Plan Note (Signed)
CXR Declined MDI use

## 2014-03-26 NOTE — Progress Notes (Signed)
Pre visit review using our clinic review tool, if applicable. No additional management support is needed unless otherwise documented below in the visit note. 

## 2014-03-26 NOTE — Progress Notes (Signed)
Subjective:     HPI  C/o DOE x 2 month off and on; no CP. C/o stress at home - wife had a compression fx x4; anxiety  The patient needs to address  chronic hypertension that has been well controlled with medicines; to address chronic  hyperlipidemia controlled with medical treatment and diet.  He retired in 1/14  Smoker - stopped 6/14  F/u groin rash - resolved  Wt Readings from Last 3 Encounters:  03/26/14 193 lb (87.544 kg)  12/13/13 191 lb (86.637 kg)  06/12/13 184 lb (83.462 kg)   BP Readings from Last 3 Encounters:  03/26/14 160/92  12/13/13 150/90  06/12/13 148/90      Review of Systems  Constitutional: Positive for fatigue. Negative for appetite change and unexpected weight change.  HENT: Negative for congestion, hearing loss, nosebleeds, sneezing, sore throat and trouble swallowing.   Eyes: Negative for redness, itching and visual disturbance.  Respiratory: Negative for cough and stridor.   Cardiovascular: Negative for chest pain, palpitations and leg swelling.  Gastrointestinal: Negative for nausea, diarrhea, blood in stool and abdominal distention.  Genitourinary: Positive for decreased urine volume and difficulty urinating. Negative for frequency, hematuria, penile pain and testicular pain.  Musculoskeletal: Positive for back pain. Negative for joint swelling, gait problem and neck pain.  Skin: Negative for rash.  Neurological: Positive for weakness. Negative for dizziness, tremors and speech difficulty.  Psychiatric/Behavioral: Negative for sleep disturbance, dysphoric mood and agitation. The patient is nervous/anxious. The patient is not hyperactive.        Objective:   Physical Exam  Constitutional: He is oriented to person, place, and time. He appears well-developed. No distress.  NAD  HENT:  Mouth/Throat: Oropharynx is clear and moist.  Eyes: Conjunctivae are normal. Pupils are equal, round, and reactive to light.  Neck: Normal range of motion. No  JVD present. No thyromegaly present.  Cardiovascular: Normal rate, regular rhythm, normal heart sounds and intact distal pulses.  Exam reveals no gallop and no friction rub.   No murmur heard. Pulmonary/Chest: Effort normal and breath sounds normal. No respiratory distress. He has no wheezes. He has no rales. He exhibits no tenderness.  Abdominal: Soft. Bowel sounds are normal. He exhibits no distension and no mass. There is no tenderness. There is no rebound and no guarding.  Musculoskeletal: Normal range of motion. He exhibits no edema or tenderness.  Lymphadenopathy:    He has no cervical adenopathy.  Neurological: He is alert and oriented to person, place, and time. He has normal reflexes. No cranial nerve deficit. He exhibits normal muscle tone. He displays a negative Romberg sign. Coordination abnormal. Gait normal.  Skin: Skin is warm and dry. No rash noted.  Psychiatric: He has a normal mood and affect. His behavior is normal. Judgment and thought content normal.  Cane, limping R abd poll longus is not tender Mild B gynecomastia      Lab Results  Component Value Date   WBC 15.1* 03/12/2013   HGB 15.1 03/12/2013   HCT 45.4 03/12/2013   PLT 248.0 03/12/2013   GLUCOSE 100* 03/12/2013   CHOL 216* 03/12/2013   TRIG 254.0* 03/12/2013   HDL 54.60 03/12/2013   LDLDIRECT 133.9 03/12/2013   LDLCALC 87 01/27/2012   ALT 33 03/12/2013   AST 32 03/12/2013   NA 138 03/12/2013   K 4.4 03/12/2013   CL 102 03/12/2013   CREATININE 0.6 03/12/2013   BUN 11 03/12/2013   CO2 29 03/12/2013   TSH  2.77 03/12/2013   PSA 0.27 03/12/2013   EKG   Assessment & Plan:

## 2014-03-26 NOTE — Assessment & Plan Note (Signed)
1/16 deconditioning, COPD vs other CXR Pt declined a stress test, MDI, ECHO

## 2014-04-27 ENCOUNTER — Other Ambulatory Visit: Payer: Self-pay | Admitting: Internal Medicine

## 2014-05-19 ENCOUNTER — Other Ambulatory Visit: Payer: Self-pay | Admitting: Internal Medicine

## 2014-05-24 ENCOUNTER — Telehealth: Payer: Self-pay | Admitting: Internal Medicine

## 2014-05-24 NOTE — Telephone Encounter (Signed)
Called [facility] refill was call in on yesterday med is ready for pick-up. Notified pt with refill status...Joe [name]

## 2014-05-24 NOTE — Telephone Encounter (Signed)
Patient is asking for a refill of diazepam (VALIUM) 5 MG tablet [[id number]] . His refills expired

## 2014-05-24 NOTE — Telephone Encounter (Signed)
OK to fill this prescription with additional refills x5 Thank you!  

## 2014-06-25 ENCOUNTER — Encounter: Payer: Self-pay | Admitting: Internal Medicine

## 2014-06-25 ENCOUNTER — Other Ambulatory Visit: Payer: Self-pay | Admitting: Internal Medicine

## 2014-06-25 ENCOUNTER — Ambulatory Visit (INDEPENDENT_AMBULATORY_CARE_PROVIDER_SITE_OTHER): Payer: Medicare Other | Admitting: Internal Medicine

## 2014-06-25 ENCOUNTER — Other Ambulatory Visit (INDEPENDENT_AMBULATORY_CARE_PROVIDER_SITE_OTHER): Payer: Medicare Other

## 2014-06-25 VITALS — BP 130/90 | HR 66 | Ht 71.0 in | Wt 197.0 lb

## 2014-06-25 DIAGNOSIS — Z0000 Encounter for general adult medical examination without abnormal findings: Secondary | ICD-10-CM | POA: Diagnosis not present

## 2014-06-25 DIAGNOSIS — N320 Bladder-neck obstruction: Secondary | ICD-10-CM

## 2014-06-25 DIAGNOSIS — E785 Hyperlipidemia, unspecified: Secondary | ICD-10-CM

## 2014-06-25 DIAGNOSIS — J441 Chronic obstructive pulmonary disease with (acute) exacerbation: Secondary | ICD-10-CM | POA: Diagnosis not present

## 2014-06-25 DIAGNOSIS — J449 Chronic obstructive pulmonary disease, unspecified: Secondary | ICD-10-CM

## 2014-06-25 DIAGNOSIS — I10 Essential (primary) hypertension: Secondary | ICD-10-CM

## 2014-06-25 DIAGNOSIS — Z86010 Personal history of colonic polyps: Secondary | ICD-10-CM

## 2014-06-25 LAB — HEPATIC FUNCTION PANEL
ALBUMIN: 4.2 g/dL (ref 3.5–5.2)
ALT: 31 U/L (ref 0–53)
AST: 27 U/L (ref 0–37)
Alkaline Phosphatase: 74 U/L (ref 39–117)
Bilirubin, Direct: 0.1 mg/dL (ref 0.0–0.3)
TOTAL PROTEIN: 7.1 g/dL (ref 6.0–8.3)
Total Bilirubin: 0.9 mg/dL (ref 0.2–1.2)

## 2014-06-25 LAB — BASIC METABOLIC PANEL
BUN: 11 mg/dL (ref 6–23)
CHLORIDE: 104 meq/L (ref 96–112)
CO2: 29 mEq/L (ref 19–32)
Calcium: 10 mg/dL (ref 8.4–10.5)
Creatinine, Ser: 0.71 mg/dL (ref 0.40–1.50)
GFR: 117.16 mL/min (ref >60.00)
Glucose, Bld: 101 mg/dL — ABNORMAL HIGH (ref 70–99)
POTASSIUM: 4.5 meq/L (ref 3.5–5.1)
Sodium: 140 mEq/L (ref 135–145)

## 2014-06-25 LAB — URINALYSIS, ROUTINE W REFLEX MICROSCOPIC
Bilirubin Urine: NEGATIVE
HGB URINE DIPSTICK: NEGATIVE
KETONES UR: NEGATIVE
Nitrite: NEGATIVE
Specific Gravity, Urine: 1.015 (ref 1.000–1.030)
Total Protein, Urine: NEGATIVE
URINE GLUCOSE: NEGATIVE
Urobilinogen, UA: 0.2 (ref 0.0–1.0)
pH: 6 (ref 5.0–8.0)

## 2014-06-25 LAB — CBC WITH DIFFERENTIAL/PLATELET
BASOS ABS: 0 10*3/uL (ref 0.0–0.1)
Basophils Relative: 0.3 % (ref 0.0–3.0)
Eosinophils Absolute: 0.4 10*3/uL (ref 0.0–0.7)
Eosinophils Relative: 2.5 % (ref 0.0–5.0)
HCT: 46 % (ref 39.0–52.0)
Hemoglobin: 15.3 g/dL (ref 13.0–17.0)
Lymphocytes Relative: 55.3 % — ABNORMAL HIGH (ref 12.0–46.0)
Lymphs Abs: 9 10*3/uL — ABNORMAL HIGH (ref 0.7–4.0)
MCHC: 33.3 g/dL (ref 30.0–36.0)
MCV: 87.7 fl (ref 78.0–100.0)
MONOS PCT: 7.8 % (ref 3.0–12.0)
Monocytes Absolute: 1.3 10*3/uL — ABNORMAL HIGH (ref 0.1–1.0)
Neutro Abs: 5.5 10*3/uL (ref 1.4–7.7)
Neutrophils Relative %: 34.1 % — ABNORMAL LOW (ref 43.0–77.0)
PLATELETS: 219 10*3/uL (ref 150.0–400.0)
RBC: 5.25 Mil/uL (ref 4.22–5.81)
RDW: 13.8 % (ref 11.5–15.5)
WBC: 16.3 10*3/uL — ABNORMAL HIGH (ref 4.0–10.5)

## 2014-06-25 LAB — PSA: PSA: 0.33 ng/mL (ref 0.10–4.00)

## 2014-06-25 LAB — LIPID PANEL
Cholesterol: 179 mg/dL (ref 0–200)
HDL: 35.2 mg/dL — AB (ref >39.00)
LDL Cholesterol: 104 mg/dL — ABNORMAL HIGH (ref 0–99)
NONHDL: 143.8
Total CHOL/HDL Ratio: 5
Triglycerides: 199 mg/dL — ABNORMAL HIGH (ref 0.0–149.0)
VLDL: 39.8 mg/dL (ref 0.0–40.0)

## 2014-06-25 LAB — TSH: TSH: 7.39 u[IU]/mL — ABNORMAL HIGH (ref 0.35–4.50)

## 2014-06-25 MED ORDER — LEVOTHYROXINE SODIUM 25 MCG PO TABS: 25.0000 ug | ORAL_TABLET | Freq: Every day | ORAL | Status: DC

## 2014-06-25 NOTE — Progress Notes (Signed)
Pre visit review using our clinic review tool, if applicable. No additional management support is needed unless otherwise documented below in the visit note. 

## 2014-06-25 NOTE — Patient Instructions (Signed)
Preventive Care for Adults A healthy lifestyle and preventive care can promote health and wellness. Preventive health guidelines for men include the following key practices:  A routine yearly physical is a good way to check with your health care provider about your health and preventative screening. It is a chance to share any concerns and updates on your health and to receive a thorough exam.  Visit your dentist for a routine exam and preventative care every 6 months. Brush your teeth twice a day and floss once a day. Good oral hygiene prevents tooth decay and gum disease.  The frequency of eye exams is based on your age, health, family medical history, use of contact lenses, and other factors. Follow your health care provider's recommendations for frequency of eye exams.  Eat a healthy diet. Foods such as vegetables, fruits, whole grains, low-fat dairy products, and lean protein foods contain the nutrients you need without too many calories. Decrease your intake of foods high in solid fats, added sugars, and salt. Eat the right amount of calories for you.Get information about a proper diet from your health care provider, if necessary.  Regular physical exercise is one of the most important things you can do for your health. Most adults should get at least 150 minutes of moderate-intensity exercise (any activity that increases your heart rate and causes you to sweat) each week. In addition, most adults need muscle-strengthening exercises on 2 or more days a week.  Maintain a healthy weight. The body mass index (BMI) is a screening tool to identify possible weight problems. It provides an estimate of body fat based on height and weight. Your health care provider can find your BMI and can help you achieve or maintain a healthy weight.For adults 20 years and older:  A BMI below 18.5 is considered underweight.  A BMI of 18.5 to 24.9 is normal.  A BMI of 25 to 29.9 is considered overweight.  A BMI  of 30 and above is considered obese.  Maintain normal blood lipids and cholesterol levels by exercising and minimizing your intake of saturated fat. Eat a balanced diet with plenty of fruit and vegetables. Blood tests for lipids and cholesterol should begin at age 50 and be repeated every 5 years. If your lipid or cholesterol levels are high, you are over 50, or you are at high risk for heart disease, you may need your cholesterol levels checked more frequently.Ongoing high lipid and cholesterol levels should be treated with medicines if diet and exercise are not working.  If you smoke, find out from your health care provider how to quit. If you do not use tobacco, do not start.  Lung cancer screening is recommended for adults aged 73-80 years who are at high risk for developing lung cancer because of a history of smoking. A yearly low-dose CT scan of the lungs is recommended for people who have at least a 30-pack-year history of smoking and are a current smoker or have quit within the past 15 years. A pack year of smoking is smoking an average of 1 pack of cigarettes a day for 1 year (for example: 1 pack a day for 30 years or 2 packs a day for 15 years). Yearly screening should continue until the smoker has stopped smoking for at least 15 years. Yearly screening should be stopped for people who develop a health problem that would prevent them from having lung cancer treatment.  If you choose to drink alcohol, do not have more than  2 drinks per day. One drink is considered to be 12 ounces (355 mL) of beer, 5 ounces (148 mL) of wine, or 1.5 ounces (44 mL) of liquor.  Avoid use of street drugs. Do not share needles with anyone. Ask for help if you need support or instructions about stopping the use of drugs.  High blood pressure causes heart disease and increases the risk of stroke. Your blood pressure should be checked at least every 1-2 years. Ongoing high blood pressure should be treated with  medicines, if weight loss and exercise are not effective.  If you are 45-79 years old, ask your health care provider if you should take aspirin to prevent heart disease.  Diabetes screening involves taking a blood sample to check your fasting blood sugar level. This should be done once every 3 years, after age 45, if you are within normal weight and without risk factors for diabetes. Testing should be considered at a younger age or be carried out more frequently if you are overweight and have at least 1 risk factor for diabetes.  Colorectal cancer can be detected and often prevented. Most routine colorectal cancer screening begins at the age of 50 and continues through age 75. However, your health care provider may recommend screening at an earlier age if you have risk factors for colon cancer. On a yearly basis, your health care provider may provide home test kits to check for hidden blood in the stool. Use of a small camera at the end of a tube to directly examine the colon (sigmoidoscopy or colonoscopy) can detect the earliest forms of colorectal cancer. Talk to your health care provider about this at age 50, when routine screening begins. Direct exam of the colon should be repeated every 5-10 years through age 75, unless early forms of precancerous polyps or small growths are found.  People who are at an increased risk for hepatitis B should be screened for this virus. You are considered at high risk for hepatitis B if:  You were born in a country where hepatitis B occurs often. Talk with your health care provider about which countries are considered high risk.  Your parents were born in a high-risk country and you have not received a shot to protect against hepatitis B (hepatitis B vaccine).  You have HIV or AIDS.  You use needles to inject street drugs.  You live with, or have sex with, someone who has hepatitis B.  You are a man who has sex with other men (MSM).  You get hemodialysis  treatment.  You take certain medicines for conditions such as cancer, organ transplantation, and autoimmune conditions.  Hepatitis C blood testing is recommended for all people born from 1945 through 1965 and any individual with known risks for hepatitis C.  Practice safe sex. Use condoms and avoid high-risk sexual practices to reduce the spread of sexually transmitted infections (STIs). STIs include gonorrhea, chlamydia, syphilis, trichomonas, herpes, HPV, and human immunodeficiency virus (HIV). Herpes, HIV, and HPV are viral illnesses that have no cure. They can result in disability, cancer, and death.  If you are at risk of being infected with HIV, it is recommended that you take a prescription medicine daily to prevent HIV infection. This is called preexposure prophylaxis (PrEP). You are considered at risk if:  You are a man who has sex with other men (MSM) and have other risk factors.  You are a heterosexual man, are sexually active, and are at increased risk for HIV infection.    You take drugs by injection.  You are sexually active with a partner who has HIV.  Talk with your health care provider about whether you are at high risk of being infected with HIV. If you choose to begin PrEP, you should first be tested for HIV. You should then be tested every 3 months for as long as you are taking PrEP.  A one-time screening for abdominal aortic aneurysm (AAA) and surgical repair of large AAAs by ultrasound are recommended for men ages 32 to 67 years who are current or former smokers.  Healthy men should no longer receive prostate-specific antigen (PSA) blood tests as part of routine cancer screening. Talk with your health care provider about prostate cancer screening.  Testicular cancer screening is not recommended for adult males who have no symptoms. Screening includes self-exam, a health care provider exam, and other screening tests. Consult with your health care provider about any symptoms  you have or any concerns you have about testicular cancer.  Use sunscreen. Apply sunscreen liberally and repeatedly throughout the day. You should seek shade when your shadow is shorter than you. Protect yourself by wearing long sleeves, pants, a wide-brimmed hat, and sunglasses year round, whenever you are outdoors.  Once a month, do a whole-body skin exam, using a mirror to look at the skin on your back. Tell your health care provider about new moles, moles that have irregular borders, moles that are larger than a pencil eraser, or moles that have changed in shape or color.  Stay current with required vaccines (immunizations).  Influenza vaccine. All adults should be immunized every year.  Tetanus, diphtheria, and acellular pertussis (Td, Tdap) vaccine. An adult who has not previously received Tdap or who does not know his vaccine status should receive 1 dose of Tdap. This initial dose should be followed by tetanus and diphtheria toxoids (Td) booster doses every 10 years. Adults with an unknown or incomplete history of completing a 3-dose immunization series with Td-containing vaccines should begin or complete a primary immunization series including a Tdap dose. Adults should receive a Td booster every 10 years.  Varicella vaccine. An adult without evidence of immunity to varicella should receive 2 doses or a second dose if he has previously received 1 dose.  Human papillomavirus (HPV) vaccine. Males aged 68-21 years who have not received the vaccine previously should receive the 3-dose series. Males aged 22-26 years may be immunized. Immunization is recommended through the age of 6 years for any male who has sex with males and did not get any or all doses earlier. Immunization is recommended for any person with an immunocompromised condition through the age of 49 years if he did not get any or all doses earlier. During the 3-dose series, the second dose should be obtained 4-8 weeks after the first  dose. The third dose should be obtained 24 weeks after the first dose and 16 weeks after the second dose.  Zoster vaccine. One dose is recommended for adults aged 50 years or older unless certain conditions are present.  Measles, mumps, and rubella (MMR) vaccine. Adults born before [birth year] generally are considered immune to measles and mumps. Adults born in [birth year] or later should have 1 or more doses of MMR vaccine unless there is a contraindication to the vaccine or there is laboratory evidence of immunity to each of the three diseases. A routine second dose of MMR vaccine should be obtained at least 28 days after the first dose for students attending postsecondary  schools, health care workers, or international travelers. People who received inactivated measles vaccine or an unknown type of measles vaccine during 1963-1967 should receive 2 doses of MMR vaccine. People who received inactivated mumps vaccine or an unknown type of mumps vaccine before 1979 and are at high risk for mumps infection should consider immunization with 2 doses of MMR vaccine. Unvaccinated health care workers born before 1957 who lack laboratory evidence of measles, mumps, or rubella immunity or laboratory confirmation of disease should consider measles and mumps immunization with 2 doses of MMR vaccine or rubella immunization with 1 dose of MMR vaccine.  Pneumococcal 13-valent conjugate (PCV13) vaccine. When indicated, a person who is uncertain of his immunization history and has no record of immunization should receive the PCV13 vaccine. An adult aged 19 years or older who has certain medical conditions and has not been previously immunized should receive 1 dose of PCV13 vaccine. This PCV13 should be followed with a dose of pneumococcal polysaccharide (PPSV23) vaccine. The PPSV23 vaccine dose should be obtained at least 8 weeks after the dose of PCV13 vaccine. An adult aged 19 years or older who has certain medical conditions and  previously received 1 or more doses of PPSV23 vaccine should receive 1 dose of PCV13. The PCV13 vaccine dose should be obtained 1 or more years after the last PPSV23 vaccine dose.  Pneumococcal polysaccharide (PPSV23) vaccine. When PCV13 is also indicated, PCV13 should be obtained first. All adults aged 65 years and older should be immunized. An adult younger than age 65 years who has certain medical conditions should be immunized. Any person who resides in a nursing home or long-term care facility should be immunized. An adult smoker should be immunized. People with an immunocompromised condition and certain other conditions should receive both PCV13 and PPSV23 vaccines. People with human immunodeficiency virus (HIV) infection should be immunized as soon as possible after diagnosis. Immunization during chemotherapy or radiation therapy should be avoided. Routine use of PPSV23 vaccine is not recommended for American Indians, [place] Natives, or people younger than 65 years unless there are medical conditions that require PPSV23 vaccine. When indicated, people who have unknown immunization and have no record of immunization should receive PPSV23 vaccine. One-time revaccination 5 years after the first dose of PPSV23 is recommended for people aged 19-64 years who have chronic kidney failure, nephrotic syndrome, asplenia, or immunocompromised conditions. People who received 1-2 doses of PPSV23 before age 65 years should receive another dose of PPSV23 vaccine at age 65 years or later if at least 5 years have passed since the previous dose. Doses of PPSV23 are not needed for people immunized with PPSV23 at or after age 65 years.  Meningococcal vaccine. Adults with asplenia or persistent complement component deficiencies should receive 2 doses of quadrivalent meningococcal conjugate (MenACWY-D) vaccine. The doses should be obtained at least 2 months apart. Microbiologists working with certain meningococcal bacteria,  military recruits, people at risk during an outbreak, and people who travel to or live in countries with a high rate of meningitis should be immunized. A first-year college student up through age 21 years who is living in a residence hall should receive a dose if he did not receive a dose on or after his 16th birthday. Adults who have certain high-risk conditions should receive one or more doses of vaccine.  Hepatitis A vaccine. Adults who wish to be protected from this disease, have certain high-risk conditions, work with hepatitis A-infected animals, work in hepatitis A research labs, or   travel to or work in countries with a high rate of hepatitis A should be immunized. Adults who were previously unvaccinated and who anticipate close contact with an international adoptee during the first 60 days after arrival in the [place] from a country with a high rate of hepatitis A should be immunized.  Hepatitis B vaccine. Adults should be immunized if they wish to be protected from this disease, have certain high-risk conditions, may be exposed to blood or other infectious body fluids, are household contacts or sex partners of hepatitis B positive people, are clients or workers in certain care facilities, or travel to or work in countries with a high rate of hepatitis B.  Haemophilus influenzae type b (Hib) vaccine. A previously unvaccinated person with asplenia or sickle cell disease or having a scheduled splenectomy should receive 1 dose of Hib vaccine. Regardless of previous immunization, a recipient of a hematopoietic stem cell transplant should receive a 3-dose series 6-12 months after his successful transplant. Hib vaccine is not recommended for adults with HIV infection. Preventive Service / Frequency Ages 52 to 17  Blood pressure check.** / Every 1 to 2 years.  Lipid and cholesterol check.** / Every 5 years beginning at age 69.  Hepatitis C blood test.** / For any individual with known risks for  hepatitis C.  Skin self-exam. / Monthly.  Influenza vaccine. / Every year.  Tetanus, diphtheria, and acellular pertussis (Tdap, Td) vaccine.** / Consult your health care provider. 1 dose of Td every 10 years.  Varicella vaccine.** / Consult your health care provider.  HPV vaccine. / 3 doses over 6 months, if 72 or younger.  Measles, mumps, rubella (MMR) vaccine.** / You need at least 1 dose of MMR if you were born in 1957 or later. You may also need a second dose.  Pneumococcal 13-valent conjugate (PCV13) vaccine.** / Consult your health care provider.  Pneumococcal polysaccharide (PPSV23) vaccine.** / 1 to 2 doses if you smoke cigarettes or if you have certain conditions.  Meningococcal vaccine.** / 1 dose if you are age 35 to 60 years and a Market researcher living in a residence hall, or have one of several medical conditions. You may also need additional booster doses.  Hepatitis A vaccine.** / Consult your health care provider.  Hepatitis B vaccine.** / Consult your health care provider.  Haemophilus influenzae type b (Hib) vaccine.** / Consult your health care provider. Ages 35 to 8  Blood pressure check.** / Every 1 to 2 years.  Lipid and cholesterol check.** / Every 5 years beginning at age 57.  Lung cancer screening. / Every year if you are aged 44-80 years and have a 30-pack-year history of smoking and currently smoke or have quit within the past 15 years. Yearly screening is stopped once you have quit smoking for at least 15 years or develop a health problem that would prevent you from having lung cancer treatment.  Fecal occult blood test (FOBT) of stool. / Every year beginning at age 55 and continuing until age 73. You may not have to do this test if you get a colonoscopy every 10 years.  Flexible sigmoidoscopy** or colonoscopy.** / Every 5 years for a flexible sigmoidoscopy or every 10 years for a colonoscopy beginning at age 28 and continuing until age  1.  Hepatitis C blood test.** / For all people born from [birth year] through 1965 and any individual with known risks for hepatitis C.  Skin self-exam. / Monthly.  Influenza vaccine. / Every  year.  Tetanus, diphtheria, and acellular pertussis (Tdap/Td) vaccine.** / Consult your health care provider. 1 dose of Td every 10 years.  Varicella vaccine.** / Consult your health care provider.  Zoster vaccine.** / 1 dose for adults aged 53 years or older.  Measles, mumps, rubella (MMR) vaccine.** / You need at least 1 dose of MMR if you were born in 1957 or later. You may also need a second dose.  Pneumococcal 13-valent conjugate (PCV13) vaccine.** / Consult your health care provider.  Pneumococcal polysaccharide (PPSV23) vaccine.** / 1 to 2 doses if you smoke cigarettes or if you have certain conditions.  Meningococcal vaccine.** / Consult your health care provider.  Hepatitis A vaccine.** / Consult your health care provider.  Hepatitis B vaccine.** / Consult your health care provider.  Haemophilus influenzae type b (Hib) vaccine.** / Consult your health care provider. Ages 77 and over  Blood pressure check.** / Every 1 to 2 years.  Lipid and cholesterol check.**/ Every 5 years beginning at age 85.  Lung cancer screening. / Every year if you are aged 55-80 years and have a 30-pack-year history of smoking and currently smoke or have quit within the past 15 years. Yearly screening is stopped once you have quit smoking for at least 15 years or develop a health problem that would prevent you from having lung cancer treatment.  Fecal occult blood test (FOBT) of stool. / Every year beginning at age 33 and continuing until age 11. You may not have to do this test if you get a colonoscopy every 10 years.  Flexible sigmoidoscopy** or colonoscopy.** / Every 5 years for a flexible sigmoidoscopy or every 10 years for a colonoscopy beginning at age 28 and continuing until age 73.  Hepatitis C blood  test.** / For all people born from [birth year] through 1965 and any individual with known risks for hepatitis C.  Abdominal aortic aneurysm (AAA) screening.** / A one-time screening for ages 50 to 27 years who are current or former smokers.  Skin self-exam. / Monthly.  Influenza vaccine. / Every year.  Tetanus, diphtheria, and acellular pertussis (Tdap/Td) vaccine.** / 1 dose of Td every 10 years.  Varicella vaccine.** / Consult your health care provider.  Zoster vaccine.** / 1 dose for adults aged 34 years or older.  Pneumococcal 13-valent conjugate (PCV13) vaccine.** / Consult your health care provider.  Pneumococcal polysaccharide (PPSV23) vaccine.** / 1 dose for all adults aged 63 years and older.  Meningococcal vaccine.** / Consult your health care provider.  Hepatitis A vaccine.** / Consult your health care provider.  Hepatitis B vaccine.** / Consult your health care provider.  Haemophilus influenzae type b (Hib) vaccine.** / Consult your health care provider. **Family history and personal history of risk and conditions may change your health care provider's recommendations. Document Released: 05/04/2001 Document Revised: 03/13/2013 Document Reviewed: 08/03/2010 [facility] Patient Information 2015 [place], [place]. This information is not intended to replace advice given to you by your health care provider. Make sure you discuss any questions you have with your health care provider.

## 2014-06-25 NOTE — Assessment & Plan Note (Signed)
Amlodipine.

## 2014-06-25 NOTE — Assessment & Plan Note (Signed)
Not smoking.  

## 2014-06-25 NOTE — Progress Notes (Signed)
Subjective:     HPI  The patient is here for a wellness exam. The patient has been doing well overall without major physical or psychological issues going on lately. The patient needs to address  chronic hypertension that has been well controlled with medicines; to address chronic  hyperlipidemia controlled with medical treatment and diet. He is retired since 1/14 Pt's wife is sick a lot - COPD  Hobby - [employer]   Wt Readings from Last 3 Encounters:  06/25/14 197 lb (89.359 kg)  03/26/14 193 lb (87.544 kg)  12/13/13 191 lb (86.637 kg)   BP Readings from Last 3 Encounters:  06/25/14 130/90  03/26/14 160/92  12/13/13 150/90    Review of Systems  Constitutional: Positive for fatigue. Negative for appetite change and unexpected weight change.  HENT: Negative for congestion, hearing loss, nosebleeds, sneezing, sore throat and trouble swallowing.   Eyes: Negative for redness, itching and visual disturbance.  Respiratory: Negative for cough and stridor.   Cardiovascular: Negative for chest pain, palpitations and leg swelling.  Gastrointestinal: Negative for nausea, diarrhea, blood in stool and abdominal distention.  Genitourinary: Positive for decreased urine volume and difficulty urinating. Negative for frequency, hematuria, penile pain and testicular pain.  Musculoskeletal: Positive for back pain. Negative for joint swelling, gait problem and neck pain.  Skin: Negative for rash.  Neurological: Positive for weakness. Negative for dizziness, tremors and speech difficulty.  Psychiatric/Behavioral: Negative for sleep disturbance, dysphoric mood and agitation. The patient is nervous/anxious. The patient is not hyperactive.        Objective:   Physical Exam  Constitutional: He is oriented to person, place, and time. He appears well-developed and well-nourished. No distress.  HENT:  Head: Normocephalic and atraumatic.  Right Ear: External ear  normal.  Left Ear: External ear normal.  Nose: Nose normal.  Mouth/Throat: Oropharynx is clear and moist. No oropharyngeal exudate.  Eyes: Conjunctivae and EOM are normal. Pupils are equal, round, and reactive to light. Right eye exhibits no discharge. Left eye exhibits no discharge. No scleral icterus.  Neck: Normal range of motion. Neck supple. No JVD present. No tracheal deviation present. No thyromegaly present.  Cardiovascular: Normal rate, regular rhythm, normal heart sounds and intact distal pulses.  Exam reveals no gallop and no friction rub.   No murmur heard. Pulmonary/Chest: Effort normal and breath sounds normal. No stridor. No respiratory distress. He has no wheezes. He has no rales. He exhibits no tenderness.  Abdominal: Soft. Bowel sounds are normal. He exhibits no distension and no mass. There is no tenderness. There is no rebound and no guarding.  Genitourinary: Rectum normal and penis normal. Guaiac negative stool. No penile tenderness.  Prostate 2+  Musculoskeletal: Normal range of motion. He exhibits no edema or tenderness.  Lymphadenopathy:    He has no cervical adenopathy.  Neurological: He is alert and oriented to person, place, and time. He has normal reflexes. No cranial nerve deficit. He exhibits normal muscle tone. Coordination normal.  Skin: Skin is warm and dry. Rash noted. He is not diaphoretic. No erythema. No pallor.  Psychiatric: He has a normal mood and affect. His behavior is normal. Judgment and thought content normal.  Cane, limp Mole on chest and ear - no change     Lab Results  Component Value Date   WBC 15.1* 03/12/2013   HGB 15.1 03/12/2013   HCT 45.4 03/12/2013   PLT 248.0 03/12/2013   GLUCOSE 100* 03/12/2013   CHOL 216* 03/12/2013  TRIG 254.0* 03/12/2013   HDL 54.60 03/12/2013   LDLDIRECT 133.9 03/12/2013   LDLCALC 87 01/27/2012   ALT 33 03/12/2013   AST 32 03/12/2013   NA 138 03/12/2013   K 4.4 03/12/2013   CL 102 03/12/2013    CREATININE 0.6 03/12/2013   BUN 11 03/12/2013   CO2 29 03/12/2013   TSH 2.77 03/12/2013   PSA 0.27 03/12/2013      Assessment & Plan:

## 2014-06-25 NOTE — Assessment & Plan Note (Signed)
Here for medicare wellness/physical  Diet: heart healthy  Physical activity: sedentary  Depression/mood screen: negative  Hearing: intact to whispered voice  Visual acuity: grossly normal, performs annual eye exam  ADLs: capable  Fall risk: low Home safety: good  Cognitive evaluation: intact to orientation, naming, recall and repetition  EOL planning: adv directives, full code/ I agree  I have personally reviewed and have noted  1. The patient's medical and social history  2. Their use of alcohol, tobacco or illicit drugs  3. Their current medications and supplements  4. The patient's functional ability including ADL's, fall risks, home safety risks and hearing or visual impairment.  5. Diet and physical activities  6. Evidence for depression or mood disorders    Today patient counseled on age appropriate routine health concerns for screening and prevention, each reviewed and up to date or declined. Immunizations reviewed and up to date or declined. Labs ordered and reviewed. Risk factors for depression reviewed and negative. Hearing function and visual acuity are intact. ADLs screened and addressed as needed. Functional ability and level of safety reviewed and appropriate. Education, counseling and referrals performed based on assessed risks today. Patient provided with a copy of personalized plan for preventive services.

## 2014-06-26 ENCOUNTER — Encounter: Payer: Self-pay | Admitting: Gastroenterology

## 2014-06-27 ENCOUNTER — Other Ambulatory Visit: Payer: Self-pay | Admitting: Internal Medicine

## 2014-06-27 DIAGNOSIS — D72829 Elevated white blood cell count, unspecified: Secondary | ICD-10-CM

## 2014-07-18 ENCOUNTER — Encounter: Payer: Self-pay | Admitting: Hematology

## 2014-07-18 ENCOUNTER — Ambulatory Visit (HOSPITAL_BASED_OUTPATIENT_CLINIC_OR_DEPARTMENT_OTHER): Payer: Medicare Other | Admitting: Hematology

## 2014-07-18 ENCOUNTER — Telehealth: Payer: Self-pay | Admitting: Hematology

## 2014-07-18 ENCOUNTER — Ambulatory Visit: Payer: Medicare Other

## 2014-07-18 ENCOUNTER — Other Ambulatory Visit (HOSPITAL_COMMUNITY)
Admission: RE | Admit: 2014-07-18 | Discharge: 2014-07-18 | Disposition: A | Discharge status: Home / Self Care | Payer: Medicare Other | Source: Ambulatory Visit | Attending: Hematology | Admitting: Hematology

## 2014-07-18 ENCOUNTER — Other Ambulatory Visit: Payer: Medicare Other

## 2014-07-18 VITALS — BP 148/83 | HR 67 | Temp 97.5°F | Resp 18 | Ht 71.0 in | Wt 197.3 lb

## 2014-07-18 DIAGNOSIS — D72820 Lymphocytosis (symptomatic): Secondary | ICD-10-CM | POA: Diagnosis not present

## 2014-07-18 DIAGNOSIS — C9110 Chronic lymphocytic leukemia of B-cell type not having achieved remission: Secondary | ICD-10-CM

## 2014-07-18 DIAGNOSIS — I10 Essential (primary) hypertension: Secondary | ICD-10-CM | POA: Diagnosis not present

## 2014-07-18 HISTORY — DX: Chronic lymphocytic leukemia of B-cell type not having achieved remission: C91.10

## 2014-07-18 LAB — CBC & DIFF AND RETIC
BASO%: 0.2 % (ref 0.0–2.0)
Basophils Absolute: 0 10*3/uL (ref 0.0–0.1)
EOS ABS: 0.2 10*3/uL (ref 0.0–0.5)
EOS%: 1.2 % (ref 0.0–7.0)
HCT: 46 % (ref 38.4–49.9)
HGB: 15.3 g/dL (ref 13.0–17.1)
Immature Retic Fract: 2 % — ABNORMAL LOW (ref 3.00–10.60)
LYMPH%: 50.5 % — AB (ref 14.0–49.0)
MCH: 29.9 pg (ref 27.2–33.4)
MCHC: 33.3 g/dL (ref 32.0–36.0)
MCV: 89.8 fL (ref 79.3–98.0)
MONO#: 1.1 10*3/uL — ABNORMAL HIGH (ref 0.1–0.9)
MONO%: 8.3 % (ref 0.0–14.0)
NEUT%: 39.8 % (ref 39.0–75.0)
NEUTROS ABS: 5.3 10*3/uL (ref 1.5–6.5)
PLATELETS: 197 10*3/uL (ref 140–400)
RBC: 5.12 10*6/uL (ref 4.20–5.82)
RDW: 13.6 % (ref 11.0–14.6)
RETIC %: 1.16 % (ref 0.80–1.80)
RETIC CT ABS: 59.39 10*3/uL (ref 34.80–93.90)
WBC: 13.2 10*3/uL — ABNORMAL HIGH (ref 4.0–10.3)
lymph#: 6.7 10*3/uL — ABNORMAL HIGH (ref 0.9–3.3)

## 2014-07-18 LAB — COMPREHENSIVE METABOLIC PANEL (CC13)
ALT: 35 U/L (ref 0–55)
AST: 26 U/L (ref 5–34)
Albumin: 4 g/dL (ref 3.5–5.0)
Alkaline Phosphatase: 73 U/L (ref 40–150)
Anion Gap: 11 mEq/L (ref 3–11)
BUN: 8.8 mg/dL (ref 7.0–26.0)
CALCIUM: 9.9 mg/dL (ref 8.4–10.4)
CO2: 23 mEq/L (ref 22–29)
CREATININE: 0.8 mg/dL (ref 0.7–1.3)
Chloride: 106 mEq/L (ref 98–109)
EGFR: >90 mL/min/{1.73_m2} (ref >90)
Glucose: 95 mg/dl (ref 70–140)
Potassium: 4.3 mEq/L (ref 3.5–5.1)
Sodium: 140 mEq/L (ref 136–145)
Total Bilirubin: 1.06 mg/dL (ref 0.20–1.20)
Total Protein: 6.9 g/dL (ref 6.4–8.3)

## 2014-07-18 LAB — MORPHOLOGY
PLT EST: ADEQUATE
RBC Comments: NORMAL

## 2014-07-18 LAB — LACTATE DEHYDROGENASE (CC13): LDH: 215 U/L (ref 125–245)

## 2014-07-18 NOTE — Progress Notes (Signed)
Checked in new pt with no financial concerns at this time.  Pt has 2 insurances so financial assistance may not be needed but he has my card or any billing questions or concerns.

## 2014-07-18 NOTE — Progress Notes (Signed)
Joe [name]  [phone] Fax:([phone]) [place] Note   Patient Care Team: Cassandria Anger, MD as PCP - General 07/18/2014  CHIEF COMPLAINTS/PURPOSE OF CONSULTATION:  Lymphocytosis   HISTORY OF PRESENTING ILLNESS:  Joe [name] 69 y.o. male is here because of lymphocytosis.   He had a normal CBC in 2011and 2012. He was noticed to have gradually increased WBC since 2012, from 12.1-16.3K, with differential showing increased absolute lymphocytes, from 5.4-9.6K (see below).   He has been feeling fatigued in the past few years, which he feels related to his sedentary life style. He has some muscular and joints stiffness in the morning, which gets better through day time and after exercise. He has chronic mild arthritis which is stable. No other complains,. No fever, chills or night sweats. He gained 20 lbs since he retired 2 years ago.   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   He is also the care giver of his wife, who has COPD and use oxygen at night.   MEDICAL HISTORY:  Past Medical History  Diagnosis Date  . Hyperlipidemia   . GERD (gastroesophageal reflux disease)   . COPD (chronic obstructive pulmonary disease)   . Hypertension   . 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 Paresis     right- s/p cerv decompression    SURGICAL HISTORY: Past Surgical History  Procedure Laterality Date  . Posterior laminectomy / decompression cervical spine      Dr Saintclair Halsted  . Rotator cuff repair  2004    right  . Retinal detachment surgery      left eye, 2007 x2, 2008 x 3  . Tonsillectomy      SOCIAL HISTORY: History   Social History  . Marital Status: Married    Spouse Name: N/A  . Number of Children: N/A  . Years of Education: N/A   Occupational History  . Not on  file.   Social History Main Topics  . Smoking status: Former Smoker -- 0.80 packs/day    Types: Cigarettes    Quit date: 08/20/2012  . Smokeless tobacco: Not on file  . Alcohol Use: 16.8 oz/week    28 Cans of beer per week  . Drug Use: No  . Sexual Activity: Yes   Other Topics Concern  . Not on file   Social History Narrative    FAMILY HISTORY: Family History  Problem Relation Age of Onset  . Coronary artery disease Other   . COPD Mother   . Diabetes Father     ALLERGIES:  has No Known Allergies.  MEDICATIONS:  Current Outpatient Prescriptions  Medication Sig Dispense Refill  . amLODipine (NORVASC) 5 MG tablet TAKE 1 TABLET BY MOUTH DAILY 90 tablet 3  . aspirin (BAYER ASPIRIN) 325 MG tablet Take 1 tablet (325 mg total) by mouth daily. 100 tablet 3  . Cholecalciferol (EQL VITAMIN D3) 1000 UNITS tablet Take 1,000 Units by mouth daily.      . clotrimazole-betamethasone (LOTRISONE) cream Apply topically 2 (two) times daily. 45 g 2  . diazepam (VALIUM) 5 MG tablet TAKE 1 TABLET EVERY 12 HOURS AS NEEDED FOR ANXIETY 60 tablet 3  . levothyroxine (LEVOTHROID) 25 MCG tablet Take 1 tablet (25 mcg total) by mouth daily. [place]  tablet 11  . loteprednol (ALREX) 0.2 % SUSP Place 1 drop into the left eye 4 (four) times daily. 10 mL 5  . lovastatin (MEVACOR) 20 MG tablet TAKE 1 TABLET BY MOUTH AT BEDTIME 90 tablet 3  . Prenatal Vit-Fe Fumarate-FA (PRENATAL MULTIVITAMIN) 60-1 MG tablet Take 1 tablet by mouth daily.       Current Facility-Administered Medications  Medication Dose Route Frequency Provider Last Rate Last Dose  . pneumococcal 23 valent vaccine (PNU-IMMUNE) injection 0.5 mL  0.5 mL Intramuscular Tomorrow-1000 Aleksei [name] V, MD        REVIEW OF SYSTEMS:   Constitutional: Denies fevers, chills or abnormal night sweats Eyes: Denies blurriness of vision, double vision or watery eyes Ears, nose, mouth, throat, and face: Denies mucositis or sore throat Respiratory: Denies  cough, dyspnea or wheezes Cardiovascular: Denies palpitation, chest discomfort or lower extremity swelling Gastrointestinal:  Denies nausea, heartburn or change in bowel habits Skin: Denies abnormal skin rashes Lymphatics: Denies new lymphadenopathy or easy bruising Neurological:Denies numbness, tingling or new weaknesses Behavioral/Psych: Mood is stable, no new changes  All other systems were reviewed with the patient and are negative.  PHYSICAL EXAMINATION: ECOG PERFORMANCE STATUS: 1 - Symptomatic but completely ambulatory  Filed Vitals:   07/18/14 1020  BP: 148/83  Pulse: 67  Temp: 97.5 F (36.4 C)  Resp: 18   Filed Weights   07/18/14 1020  Weight: 197 lb 4.8 oz (89.495 kg)    GENERAL:alert, no distress and comfortable SKIN: skin color, texture, turgor are normal, no rashes or significant lesions EYES: normal, conjunctiva are pink and non-injected, sclera clear OROPHARYNX:no exudate, no erythema and lips, buccal mucosa, and tongue normal  NECK: supple, thyroid normal size, non-tender, without nodularity LYMPH:  no palpable lymphadenopathy in the cervical, axillary or inguinal LUNGS: clear to auscultation and percussion with normal breathing effort HEART: regular rate & rhythm and no murmurs and no lower extremity edema ABDOMEN:abdomen soft, non-tender and normal bowel sounds Musculoskeletal:no cyanosis of digits and no clubbing  PSYCH: alert & oriented x 3 with fluent speech NEURO: no focal motor/sensory deficits  LABORATORY DATA:  I have reviewed the data as listed CBC Latest Ref Rng 06/25/2014 03/12/2013 01/27/2012  WBC 4.0 - 10.5 K/uL 16.3(H) 15.1(H) 13.8(H)  Hemoglobin 13.0 - 17.0 g/dL 15.3 15.1 16.5  Hematocrit 39.0 - 52.0 % 46.0 45.4 49.8  Platelets 150.0 - 400.0 K/uL 219.0 248.0 221.0    CMP Latest Ref Rng 06/25/2014 03/12/2013 01/27/2012  Glucose 70 - 99 mg/dL 101(H) 100(H) 95  BUN 6 - 23 mg/dL '11 11 10  '$ Creatinine 0.40 - 1.50 mg/dL 0.71 0.6 0.6  Sodium 135 -  145 mEq/L 140 138 138  Potassium 3.5 - 5.1 mEq/L 4.5 4.4 4.7  Chloride 96 - 112 mEq/L 104 102 101  CO2 19 - 32 mEq/L '29 29 30  '$ Calcium 8.4 - 10.5 mg/dL 10.0 10.0 9.4  Total Protein 6.0 - 8.3 g/dL 7.1 7.5 7.0  Total Bilirubin 0.2 - 1.2 mg/dL 0.9 1.8(H) 0.8  Alkaline Phos 39 - 117 U/L 74 74 76  AST 0 - 37 U/L 27 32 28  ALT 0 - 53 U/L 31 33 25   Results for RIORDAN, WALLE (MRN [id number]) as of 07/18/2014 10:18  Ref. Range 07/20/2007 07:26 05/07/2009 08:28 08/20/2010 07:56 01/27/2012 08:05 03/12/2013 09:17 06/25/2014 09:08  WBC Latest Ref Range: 4.0-10.5 K/uL 9.1 9.9 12.1 Repeated and... (H) 13.8 (H) 15.1 (H) 16.3 (H)  RBC Latest Ref Range: 4.22-5.81 Mil/uL 5.59  4.96 5.23 5.20 5.20 5.25  Hemoglobin Latest Ref Range: 13.0-17.0 g/dL 17.2 (H) 16.2 16.8 16.5 15.1 15.3  HCT Latest Ref Range: 39.0-52.0 % 52.1 (H) 47.4 50.1 49.8 45.4 46.0  MCV Latest Ref Range: 78.0-100.0 fl 93.3 95.6 95.8 96.0 87.4 87.7  MCHC Latest Ref Range: 30.0-36.0 g/dL 33.1 34.1 33.5 33.2 33.3 33.3  RDW Latest Ref Range: 11.5-15.5 % 13.5 12.4 13.0 13.1 16.6 (H) 13.8  Platelets Latest Ref Range: 150.0-400.0 K/uL 204 170.0 201.0 221.0 248.0 219.0  Neutrophils Latest Ref Range: 43.0-77.0 % 43.9 62.1 45.2 46.3 49.8 34.1 (L)  Lymphocytes Latest Ref Range: 12.0-46.0 % 45.4 26.3 44.5 43.6 41.2 55.3 (H)  Monocytes Relative Latest Ref Range: 3.0-12.0 % 8.7 10.1 8.4 8.8 7.3 7.8  Eosinophil Latest Ref Range: 0.0-5.0 % 2.0 1.3 1.5 1.0 1.3 2.5  Basophil Latest Ref Range: 0.0-3.0 % 0.0 0.2 0.4 0.3 0.4 0.3  NEUT# Latest Ref Range: 1.4-7.7 K/uL 4.0 6.2 5.5 6.4 7.5 5.5  Lymphocyte # Latest Ref Range: 0.7-4.0 K/uL  2.6 5.4 (H) 6.0 (H) 6.2 (H) 9.0 (H)  Monocyte # Latest Ref Range: 0.1-1.0 K/uL 0.8 1.0 1.0 1.2 (H) 1.1 (H) 1.3 (H)  Eosinophils Absolute Latest Ref Range: 0.0-0.7 K/uL 0.2 0.1 0.2 0.1 0.2 0.4  Basophils Absolute Latest Ref Range: 0.0-0.1 K/uL 0.0 0.0 0.1 0.0 0.1 0.0    RADIOGRAPHIC STUDIES: I have personally reviewed the  radiological images as listed and agreed with the findings in the report. No results found.  ASSESSMENT & PLAN:  69 year old Caucasian male, with past medical history of hypertension, cervical spine stenosis status post surgery, who was found to have gradually increased WBC with predominant lymphocytosis since 2012.  1. 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 Reactive lymphocytosis is possible, but less likely giving the chronic course and increasing of his lymphocytosis in the past 4 years. -I'll obtain flow cytometry, LDH, CMP, quantitative immunoglobulin, and review his peripheral smear -If this is CLL, likely this is stage 0, giving the negative physical exam, normal CBC and asymptomatic clinical presentation. He likely will not need treatment at this point. -I'll also obtain his ultrasound of abdomen to evaluate his liver and spleen.  2. He will continue follow-up with his primary care physician for HTN and other medical issues.   All questions were answered. The patient knows to call the clinic with any problems, questions or concerns. I spent 45 minutes counseling the patient face to face. The total time spent in the appointment was 50 minutes and more than 50% was on counseling.     Truitt Merle, MD 07/18/2014 10:49 AM

## 2014-07-18 NOTE — Telephone Encounter (Signed)
Pt confirmed labs/ov per 04/27 POF, gave AVS and Calendar..... Joe [name]

## 2014-07-19 LAB — HEPATITIS C ANTIBODY: HCV AB: NEGATIVE

## 2014-07-19 LAB — HEPATITIS B CORE ANTIBODY, TOTAL: Hep B Core Total Ab: NONREACTIVE

## 2014-07-19 LAB — IGG, IGA, IGM
IGA: 255 mg/dL (ref 68–379)
IgG (Immunoglobin G), Serum: 1090 mg/dL (ref 650–1600)
IgM, Serum: 31 mg/dL — ABNORMAL LOW (ref 41–251)

## 2014-07-19 LAB — HIV ANTIBODY (ROUTINE TESTING W REFLEX): HIV 1&2 Ab, 4th Generation: NONREACTIVE

## 2014-07-19 LAB — HEPATITIS B SURFACE ANTIGEN: Hepatitis B Surface Ag: NEGATIVE

## 2014-07-19 LAB — HEPATITIS B SURFACE ANTIBODY,QUALITATIVE: HEP B S AB: NEGATIVE

## 2014-07-22 LAB — FLOW CYTOMETRY

## 2014-07-25 ENCOUNTER — Ambulatory Visit (HOSPITAL_COMMUNITY)
Admission: RE | Admit: 2014-07-25 | Discharge: 2014-07-25 | Disposition: A | Discharge status: Home / Self Care | Payer: Medicare Other | Source: Ambulatory Visit | Attending: Hematology | Admitting: Hematology

## 2014-07-25 DIAGNOSIS — R932 Abnormal findings on diagnostic imaging of liver and biliary tract: Secondary | ICD-10-CM | POA: Diagnosis not present

## 2014-07-25 DIAGNOSIS — D72820 Lymphocytosis (symptomatic): Secondary | ICD-10-CM | POA: Diagnosis not present

## 2014-07-25 DIAGNOSIS — K8020 Calculus of gallbladder without cholecystitis without obstruction: Secondary | ICD-10-CM | POA: Insufficient documentation

## 2014-08-07 ENCOUNTER — Other Ambulatory Visit: Payer: Self-pay | Admitting: *Deleted

## 2014-08-08 ENCOUNTER — Ambulatory Visit (HOSPITAL_BASED_OUTPATIENT_CLINIC_OR_DEPARTMENT_OTHER): Payer: Medicare Other | Admitting: Hematology

## 2014-08-08 ENCOUNTER — Telehealth: Payer: Self-pay | Admitting: Hematology

## 2014-08-08 ENCOUNTER — Encounter: Payer: Self-pay | Admitting: Hematology

## 2014-08-08 ENCOUNTER — Other Ambulatory Visit: Payer: Medicare Other

## 2014-08-08 VITALS — BP 147/81 | HR 68 | Temp 97.9°F | Resp 18 | Ht 71.0 in | Wt 198.8 lb

## 2014-08-08 DIAGNOSIS — C9110 Chronic lymphocytic leukemia of B-cell type not having achieved remission: Secondary | ICD-10-CM | POA: Insufficient documentation

## 2014-08-08 HISTORY — DX: Chronic lymphocytic leukemia of B-cell type not having achieved remission: C91.10

## 2014-08-08 NOTE — Telephone Encounter (Signed)
Gave and printed appt sched and avs fo rpt for Aug and NOV

## 2014-08-08 NOTE — Progress Notes (Signed)
[facility]  [phone] Fax:([phone]) [phone]  Clinic New Consult Note   Patient Care Team: Cassandria Anger, MD as PCP - General 08/08/2014  CHIEF COMPLAINTS/PURPOSE OF CONSULTATION:  Lymphocytosis   HISTORY OF PRESENTING ILLNESS:  Joe [name] 69 y.o. male is here because of lymphocytosis.   He had a normal CBC in 2011and 2012. He was noticed to have gradually increased WBC since 2012, from 12.1-16.3K, with differential showing increased absolute lymphocytes, from 5.4-9.6K (see below).   He has been feeling fatigued in the past few years, which he feels related to his sedentary life style. He has some muscular and joints stiffness in the morning, which gets better through day time and after exercise. He has chronic mild arthritis which is stable. No other complains,. No fever, chills or night sweats. He gained 20 lbs since he retired 2 years ago.   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   He is also the care giver of his wife, who has COPD and use oxygen at night.   INTERIM HISTORY: He returns for follow up. He has intermittent constipation. No fever, cough, or night sweats. He walks 30 mins (51mle, with slope) daily, appetite is great. No weight loss.   MEDICAL HISTORY:  Past Medical History  Diagnosis Date  . Hyperlipidemia   . GERD (gastroesophageal reflux disease)   . COPD (chronic obstructive pulmonary disease)   . Hypertension   . 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 Paresis     right- s/p cerv decompression    SURGICAL HISTORY: Past Surgical History  Procedure Laterality Date  . Posterior laminectomy / decompression cervical spine      Dr CSaintclair Halsted . Rotator cuff repair  2004    right  . Retinal detachment surgery      left eye, 2007 x2, 2008 x 3  . Tonsillectomy       SOCIAL HISTORY: History   Social History  . Marital Status: Married    Spouse Name: N/A  . Number of Children: N/A  . Years of Education: N/A   Occupational History  . Not on file.   Social History Main Topics  . Smoking status: Former Smoker -- 0.80 packs/day    Types: Cigarettes    Quit date: 08/20/2012  . Smokeless tobacco: Not on file  . 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 Drug Use: No  . Sexual Activity: Yes   Other Topics Concern  . Not on file   Social History Narrative    FAMILY HISTORY: Family History  Problem Relation Age of Onset  . Coronary artery disease Other   . COPD Mother   . Diabetes Father   . Cancer Paternal Aunt 439   breast cancer     ALLERGIES:  has No Known Allergies.  MEDICATIONS:  Current Outpatient Prescriptions  Medication Sig Dispense Refill  . amLODipine (NORVASC) 5 MG tablet TAKE 1 TABLET BY MOUTH DAILY 90 tablet 3  . aspirin (BAYER ASPIRIN) 325 MG tablet Take 1 tablet (325 mg total) by mouth daily. 100 tablet 3  . Cholecalciferol (EQL VITAMIN  D3) 1000 UNITS tablet Take 1,000 Units by mouth daily.      . clotrimazole-betamethasone (LOTRISONE) cream Apply topically 2 (two) times daily. 45 g 2  . diazepam (VALIUM) 5 MG tablet TAKE 1 TABLET EVERY 12 HOURS AS NEEDED FOR ANXIETY 60 tablet 3  . levothyroxine (LEVOTHROID) 25 MCG tablet Take 1 tablet (25 mcg total) by mouth daily. 30 tablet 11  . loteprednol (ALREX) 0.2 % SUSP Place 1 drop into the left eye 4 (four) times daily. 10 mL 5  . lovastatin (MEVACOR) 20 MG tablet TAKE 1 TABLET BY MOUTH AT BEDTIME 90 tablet 3  . Multiple Vitamin (MULTIVITAMIN) tablet Take 1 tablet by mouth daily. Centrum Silver.     Current Facility-Administered Medications  Medication Dose Route Frequency Provider Last Rate Last Dose  . pneumococcal 23 valent vaccine (PNU-IMMUNE) injection 0.5 mL  0.5 mL  Intramuscular Tomorrow-1000 Aleksei [name] V, MD        REVIEW OF SYSTEMS:   Constitutional: Denies fevers, chills or abnormal night sweats Eyes: Denies blurriness of vision, double vision or watery eyes Ears, nose, mouth, throat, and face: Denies mucositis or sore throat Respiratory: Denies cough, dyspnea or wheezes Cardiovascular: Denies palpitation, chest discomfort or lower extremity swelling Gastrointestinal:  Denies nausea, heartburn or change in bowel habits Skin: Denies abnormal skin rashes Lymphatics: Denies new lymphadenopathy or easy bruising Neurological:Denies numbness, tingling or new weaknesses Behavioral/Psych: Mood is stable, no new changes  All other systems were reviewed with the patient and are negative.  PHYSICAL EXAMINATION: ECOG PERFORMANCE STATUS: 1 - Symptomatic but completely ambulatory  Filed Vitals:   08/08/14 0822  BP: 147/81  Pulse: 68  Temp: 97.9 F (36.6 C)  Resp: 18   Filed Weights   08/08/14 0822  Weight: 198 lb 12.8 oz (90.175 kg)    GENERAL:alert, no distress and comfortable SKIN: skin color, texture, turgor are normal, no rashes or significant lesions EYES: normal, conjunctiva are pink and non-injected, sclera clear OROPHARYNX:no exudate, no erythema and lips, buccal mucosa, and tongue normal  NECK: supple, thyroid normal size, non-tender, without nodularity LYMPH:  no palpable lymphadenopathy in the cervical, axillary or inguinal LUNGS: clear to auscultation and percussion with normal breathing effort HEART: regular rate & rhythm and no murmurs and no lower extremity edema ABDOMEN:abdomen soft, non-tender and normal bowel sounds Musculoskeletal:no cyanosis of digits and no clubbing  PSYCH: alert & oriented x 3 with fluent speech NEURO: no focal motor/sensory deficits  LABORATORY DATA:  I have reviewed the data as listed CBC Latest Ref Rng 07/18/2014 06/25/2014 03/12/2013  WBC 4.0 - 10.3 10e3/uL 13.2(H) 16.3(H) 15.1(H)  Hemoglobin  13.0 - 17.1 g/dL 15.3 15.3 15.1  Hematocrit 38.4 - 49.9 % 46.0 46.0 45.4  Platelets 140 - 400 10e3/uL 197 219.0 248.0    CMP Latest Ref Rng 07/18/2014 06/25/2014 03/12/2013  Glucose 70 - 140 mg/dl 95 101(H) 100(H)  BUN 7.0 - 26.0 mg/dL 8.'8 11 11  '$ Creatinine 0.7 - 1.3 mg/dL 0.8 0.71 0.6  Sodium 136 - 145 mEq/L 140 140 138  Potassium 3.5 - 5.1 mEq/L 4.3 4.5 4.4  Chloride 96 - 112 mEq/L - 104 102  CO2 22 - 29 mEq/L '23 29 29  '$ Calcium 8.4 - 10.4 mg/dL 9.9 10.0 10.0  Total Protein 6.4 - 8.3 g/dL 6.9 7.1 7.5  Total Bilirubin 0.20 - 1.20 mg/dL 1.06 0.9 1.8(H)  Alkaline Phos 40 - 150 U/L 73 74 74  AST 5 - 34 U/L 26 27 32  ALT 0 -  55 U/L 35 31 33    Interpretation Peripheral Blood Flow Cytometry - FINDINGS CONSISTENT WITH CHRONIC LYMPHOCYTIC LEUKEMIA. Susanne Greenhouse MD Pathologist, Electronic Signature  RADIOGRAPHIC STUDIES: I have personally reviewed the radiological images as listed and agreed with the findings in the report. US Abdomen Complete  07/25/2014   CLINICAL DATA:  Elevated lymphocytes level ; assess status of the liver and spleen.  EXAM: ULTRASOUND ABDOMEN COMPLETE  COMPARISON:  None.  FINDINGS: Gallbladder: The gallbladder is adequately distended. There are multiple echogenic mobile shadowing stones. There is no gallbladder wall thickening or pericholecystic fluid or positive sonographic Murphy's sign.  Common bile duct: Diameter: 4.4 mm  Liver: The hepatic echotexture is mildly increased. There is no focal mass or ductal dilation.  IVC: No abnormality visualized.  Pancreas: Visualized portion unremarkable.  Spleen: Spleen is normal in size and echotexture.  Right Kidney: Length: 12.0. Echogenicity within normal limits. No mass or hydronephrosis visualized.  Left Kidney: Length: 12.6 Cm. Echogenicity within normal limits. There is a parapelvic cyst. No hydronephrosis visualized.  Abdominal aorta: No aneurysm visualized.  Other findings: There is no ascites.  IMPRESSION: 1. Gallstones  without evidence of acute cholecystitis. 2. Increased hepatic echotexture is consistent with fatty infiltration. The S hepatic and splenic sizes are normal. 3. The kidneys are normal.   Electronically Signed   By: David  Martinique M.D.   On: 07/25/2014 13:40    ASSESSMENT & PLAN:  69 year old Caucasian male, with past medical history of hypertension, cervical spine stenosis status post surgery, who was found to have gradually increased WBC with predominant lymphocytosis since 2012.  1. CLL, stage 0 -I reviewed his CBC and differential in the past few years with patient and his wife in details. 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 I reviewed his full cytometry result, which confirms the diagnosis of CLL. -He has no lymphadenopathy, liver and spleen size are normal, no anemia or some cytopenia, he has stage 0 disease. He is clinically doing very well. -We discussed the neck or history of CLL, and indication for treatment, and treatment options.  -No indication for treatment for now. -His immunoglobin level are normal, appetite BC and HIV were negative. -I recommend a follow-up CBC every 3-6 months  2. He will continue follow-up with his primary care physician for HTN and other medical issues.  Follow-up, CBC with differential in 3 months, and return to clinic in 6 months.  All questions were answered. The patient knows to call the clinic with any problems, questions or concerns. I spent 25 minutes counseling the patient face to face. The total time spent in the appointment was 30 minutes and more than 50% was on counseling.     Truitt Merle, MD 08/08/2014 8:55 AM

## 2014-08-29 ENCOUNTER — Ambulatory Visit (AMBULATORY_SURGERY_CENTER): Payer: Self-pay | Admitting: *Deleted

## 2014-08-29 ENCOUNTER — Encounter (INDEPENDENT_AMBULATORY_CARE_PROVIDER_SITE_OTHER): Payer: Self-pay

## 2014-08-29 VITALS — Ht 71.0 in | Wt 194.8 lb

## 2014-08-29 DIAGNOSIS — Z86010 Personal history of colonic polyps: Secondary | ICD-10-CM

## 2014-08-29 MED ORDER — NA SULFATE-K SULFATE-MG SULF 17.5-3.13-1.6 GM/177ML PO SOLN: 1.0000 | Freq: Once | ORAL | Status: DC

## 2014-08-29 NOTE — Progress Notes (Signed)
No egg or soy allergy No diet pill no home 02 use No issues with past sedation

## 2014-09-12 ENCOUNTER — Encounter: Payer: Self-pay | Admitting: Gastroenterology

## 2014-09-12 ENCOUNTER — Ambulatory Visit (AMBULATORY_SURGERY_CENTER): Payer: Medicare Other | Admitting: Gastroenterology

## 2014-09-12 VITALS — BP 120/76 | HR 57 | Temp 97.7°F | Resp 16 | Ht 71.0 in | Wt 194.0 lb

## 2014-09-12 DIAGNOSIS — E669 Obesity, unspecified: Secondary | ICD-10-CM | POA: Diagnosis not present

## 2014-09-12 DIAGNOSIS — D124 Benign neoplasm of descending colon: Secondary | ICD-10-CM

## 2014-09-12 DIAGNOSIS — D122 Benign neoplasm of ascending colon: Secondary | ICD-10-CM | POA: Diagnosis not present

## 2014-09-12 DIAGNOSIS — J449 Chronic obstructive pulmonary disease, unspecified: Secondary | ICD-10-CM | POA: Diagnosis not present

## 2014-09-12 DIAGNOSIS — Z86010 Personal history of colonic polyps: Secondary | ICD-10-CM | POA: Diagnosis not present

## 2014-09-12 DIAGNOSIS — I10 Essential (primary) hypertension: Secondary | ICD-10-CM | POA: Diagnosis not present

## 2014-09-12 DIAGNOSIS — E039 Hypothyroidism, unspecified: Secondary | ICD-10-CM | POA: Diagnosis not present

## 2014-09-12 MED ORDER — SODIUM CHLORIDE 0.9 % IV SOLN: 500.0000 mL | INTRAVENOUS | Status: DC

## 2014-09-12 NOTE — Op Note (Signed)
[facility]  [address] [place], [id number]   COLONOSCOPY PROCEDURE REPORT  PATIENT: Joe [name], Joe [name]  MR#: [id number] BIRTHDATE: 1945/05/25 , 68  yrs. old GENDER: male ENDOSCOPIST: Inda Castle, MD REFERRED HT:DSKA Avel Sensor, M.D. PROCEDURE DATE:  09/12/2014 PROCEDURE:   Colonoscopy, screening and Colonoscopy with snare polypectomy First Screening Colonoscopy - Avg.  risk and is 50 yrs.  old or older - No.  Prior Negative Screening - Now for repeat screening. N/A  History of Adenoma - Now for follow-up colonoscopy & has been > or = to 3 yrs.  Yes hx of adenoma.  Has been 3 or more years since last colonoscopy.  Polyps removed today? Yes ASA CLASS:   Class II INDICATIONS:PH Colon Adenoma. 2001 MEDICATIONS: Monitored anesthesia care and Propofol 200 mg IV  DESCRIPTION OF PROCEDURE:   After the risks benefits and alternatives of the procedure were thoroughly explained, informed consent was obtained.  The digital rectal exam revealed no abnormalities of the rectum.   The LB [id number]  endoscope was introduced through the anus and advanced to the cecum, which was identified by both the appendix and ileocecal valve. No adverse events experienced.   The quality of the prep was (Suprep was used) excellent.  The instrument was then slowly withdrawn as the colon was fully examined. Estimated blood loss is zero unless otherwise noted in this procedure report.      COLON FINDINGS: A sessile polyp measuring 9 mm in size was found in the ascending colon.  A polypectomy was performed with a cold snare.  The resection was complete, the polyp tissue was completely retrieved and sent to histology.   A sessile polyp measuring 3 mm in size was found in the descending colon.  A polypectomy was performed with a cold snare.  The resection was complete, the polyp tissue was completely retrieved and sent to histology.  Retroflexed views revealed no abnormalities.  The time to cecum = 2.2 Withdrawal time = 7.1   The scope was withdrawn and the procedure completed. COMPLICATIONS: There were no immediate complications.  ENDOSCOPIC IMPRESSION: 1.   Sessile polyp was found in the ascending colon; polypectomy was performed with a cold snare 2.   Sessile polyp was found in the descending colon; polypectomy was performed with a cold snare  RECOMMENDATIONS: If the polyp(s) removed today are proven to be adenomatous (pre-cancerous) polyps, you will need a repeat colonoscopy in 5 years.  Otherwise you should continue to follow colorectal cancer screening guidelines for "routine risk" patients with colonoscopy in 10 years.  You will receive a letter within 1-2 weeks with the results of your biopsy as well as final recommendations.  Please call my office if you have not received a letter after 3 weeks.  eSigned:  Inda Castle, MD 09/12/2014 10:30 AM   cc:   PATIENT NAME:  [name], [name] MR#: [id number]

## 2014-09-12 NOTE — Patient Instructions (Signed)
YOU HAD AN ENDOSCOPIC PROCEDURE TODAY AT THE [facility]:   Refer to the procedure report that was given to you for any specific questions about what was found during the examination.  If the procedure report does not answer your questions, please call your gastroenterologist to clarify.  If you requested that your care partner not be given the details of your procedure findings, then the procedure report has been included in a sealed envelope for you to review at your convenience later.  YOU SHOULD EXPECT: Some feelings of bloating in the abdomen. Passage of more gas than usual.  Walking can help get rid of the air that was put into your GI tract during the procedure and reduce the bloating. If you had a lower endoscopy (such as a colonoscopy or flexible sigmoidoscopy) you may notice spotting of blood in your stool or on the toilet paper. If you underwent a bowel prep for your procedure, you may not have a normal bowel movement for a few days.  Please Note:  You might notice some irritation and congestion in your nose or some drainage.  This is from the oxygen used during your procedure.  There is no need for concern and it should clear up in a day or so.  SYMPTOMS TO REPORT IMMEDIATELY:   Following lower endoscopy (colonoscopy or flexible sigmoidoscopy):  Excessive amounts of blood in the stool  Significant tenderness or worsening of abdominal pains  Swelling of the abdomen that is new, acute  Fever of 100F or higher   For urgent or emergent issues, a gastroenterologist can be reached at any hour by calling [phone].   DIET: Your first meal following the procedure should be a small meal and then it is ok to progress to your normal diet. Heavy or fried foods are harder to digest and may make you feel nauseous or bloated.  Likewise, meals heavy in dairy and vegetables can increase bloating.  Drink plenty of fluids but you should avoid alcoholic beverages for 24  hours.  ACTIVITY:  You should plan to take it easy for the rest of today and you should NOT DRIVE or use heavy machinery until tomorrow (because of the sedation medicines used during the test).    FOLLOW UP: Our staff will call the number listed on your records the next business day following your procedure to check on you and address any questions or concerns that you may have regarding the information given to you following your procedure. If we do not reach you, we will leave a message.  However, if you are feeling well and you are not experiencing any problems, there is no need to return our call.  We will assume that you have returned to your regular daily activities without incident.  If any biopsies were taken you will be contacted by phone or by letter within the next 1-3 weeks.  Please call us at [phone] if you have not heard about the biopsies in 3 weeks.    SIGNATURES/CONFIDENTIALITY: You and/or your care partner have signed paperwork which will be entered into your electronic medical record.  These signatures attest to the fact that that the information above on your After Visit Summary has been reviewed and is understood.  Full responsibility of the confidentiality of this discharge information lies with you and/or your care-partner. 

## 2014-09-12 NOTE — Progress Notes (Signed)
Report to PACU, RN, vss, BBS= Clear.  

## 2014-09-12 NOTE — Progress Notes (Signed)
Called to room to assist during endoscopic procedure.  Patient ID and intended procedure confirmed with present staff. Received instructions for my participation in the procedure from the performing physician.  

## 2014-09-13 ENCOUNTER — Telehealth: Payer: Self-pay | Admitting: *Deleted

## 2014-09-13 NOTE — Telephone Encounter (Signed)
  Follow up Call-  Call back number 09/12/2014  Post procedure Call Back phone  # [phone]  Permission to leave phone message Yes     Patient questions:  Do you have a fever, pain , or abdominal swelling? No. Pain Score  0 *  Have you tolerated food without any problems? Yes.    Have you been able to return to your normal activities? Yes.    Do you have any questions about your discharge instructions: Diet   No. Medications  No. Follow up visit  No.  Do you have questions or concerns about your Care? No.  Actions: * If pain score is 4 or above: No action needed, pain <4.

## 2014-09-19 ENCOUNTER — Encounter: Payer: Self-pay | Admitting: Gastroenterology

## 2014-10-30 ENCOUNTER — Other Ambulatory Visit: Payer: Self-pay | Admitting: *Deleted

## 2014-10-30 DIAGNOSIS — C9110 Chronic lymphocytic leukemia of B-cell type not having achieved remission: Secondary | ICD-10-CM

## 2014-10-31 ENCOUNTER — Other Ambulatory Visit (HOSPITAL_BASED_OUTPATIENT_CLINIC_OR_DEPARTMENT_OTHER): Payer: Medicare Other

## 2014-10-31 DIAGNOSIS — C9110 Chronic lymphocytic leukemia of B-cell type not having achieved remission: Secondary | ICD-10-CM | POA: Diagnosis present

## 2014-10-31 LAB — COMPREHENSIVE METABOLIC PANEL (CC13)
ALK PHOS: 82 U/L (ref 40–150)
ALT: 45 U/L (ref 0–55)
AST: 28 U/L (ref 5–34)
Albumin: 4 g/dL (ref 3.5–5.0)
Anion Gap: 7 mEq/L (ref 3–11)
BILIRUBIN TOTAL: 0.99 mg/dL (ref 0.20–1.20)
BUN: 9 mg/dL (ref 7.0–26.0)
CO2: 28 mEq/L (ref 22–29)
CREATININE: 0.8 mg/dL (ref 0.7–1.3)
Calcium: 9.3 mg/dL (ref 8.4–10.4)
Chloride: 107 mEq/L (ref 98–109)
EGFR: >90 mL/min/{1.73_m2} (ref >90)
Glucose: 106 mg/dl (ref 70–140)
Potassium: 4.4 mEq/L (ref 3.5–5.1)
SODIUM: 142 meq/L (ref 136–145)
Total Protein: 7 g/dL (ref 6.4–8.3)

## 2014-10-31 LAB — CBC WITH DIFFERENTIAL/PLATELET
BASO%: 0.3 % (ref 0.0–2.0)
BASOS ABS: 0 10*3/uL (ref 0.0–0.1)
EOS%: 2.8 % (ref 0.0–7.0)
Eosinophils Absolute: 0.4 10*3/uL (ref 0.0–0.5)
HEMATOCRIT: 46.9 % (ref 38.4–49.9)
HGB: 15.5 g/dL (ref 13.0–17.1)
LYMPH%: 52.6 % — AB (ref 14.0–49.0)
MCH: 30.3 pg (ref 27.2–33.4)
MCHC: 33 g/dL (ref 32.0–36.0)
MCV: 91.6 fL (ref 79.3–98.0)
MONO#: 1.1 10*3/uL — ABNORMAL HIGH (ref 0.1–0.9)
MONO%: 7.8 % (ref 0.0–14.0)
NEUT#: 5.3 10*3/uL (ref 1.5–6.5)
NEUT%: 36.5 % — AB (ref 39.0–75.0)
PLATELETS: 215 10*3/uL (ref 140–400)
RBC: 5.12 10*6/uL (ref 4.20–5.82)
RDW: 13.5 % (ref 11.0–14.6)
WBC: 14.5 10*3/uL — ABNORMAL HIGH (ref 4.0–10.3)
lymph#: 7.6 10*3/uL — ABNORMAL HIGH (ref 0.9–3.3)

## 2014-10-31 LAB — TECHNOLOGIST REVIEW

## 2014-12-11 ENCOUNTER — Other Ambulatory Visit: Payer: Self-pay | Admitting: Internal Medicine

## 2014-12-25 ENCOUNTER — Encounter: Payer: Self-pay | Admitting: Internal Medicine

## 2014-12-25 ENCOUNTER — Ambulatory Visit (INDEPENDENT_AMBULATORY_CARE_PROVIDER_SITE_OTHER): Payer: Medicare Other | Admitting: Internal Medicine

## 2014-12-25 ENCOUNTER — Other Ambulatory Visit (INDEPENDENT_AMBULATORY_CARE_PROVIDER_SITE_OTHER): Payer: Medicare Other

## 2014-12-25 VITALS — BP 138/80 | HR 62 | Wt 201.0 lb

## 2014-12-25 DIAGNOSIS — C9110 Chronic lymphocytic leukemia of B-cell type not having achieved remission: Secondary | ICD-10-CM

## 2014-12-25 DIAGNOSIS — Z23 Encounter for immunization: Secondary | ICD-10-CM

## 2014-12-25 DIAGNOSIS — R0609 Other forms of dyspnea: Secondary | ICD-10-CM

## 2014-12-25 DIAGNOSIS — E785 Hyperlipidemia, unspecified: Secondary | ICD-10-CM | POA: Diagnosis not present

## 2014-12-25 DIAGNOSIS — E039 Hypothyroidism, unspecified: Secondary | ICD-10-CM | POA: Diagnosis not present

## 2014-12-25 DIAGNOSIS — I10 Essential (primary) hypertension: Secondary | ICD-10-CM

## 2014-12-25 HISTORY — DX: Hypothyroidism, unspecified: E03.9

## 2014-12-25 LAB — BASIC METABOLIC PANEL
BUN: 7 mg/dL (ref 6–23)
CALCIUM: 10 mg/dL (ref 8.4–10.5)
CO2: 32 mEq/L (ref 19–32)
CREATININE: 0.82 mg/dL (ref 0.40–1.50)
Chloride: 103 mEq/L (ref 96–112)
GFR: 99.07 mL/min (ref >60.00)
Glucose, Bld: 98 mg/dL (ref 70–99)
Potassium: 4.7 mEq/L (ref 3.5–5.1)
Sodium: 141 mEq/L (ref 135–145)

## 2014-12-25 LAB — TSH: TSH: 4.89 u[IU]/mL — AB (ref 0.35–4.50)

## 2014-12-25 NOTE — Progress Notes (Signed)
Pre visit review using our clinic review tool, if applicable. No additional management support is needed unless otherwise documented below in the visit note. 

## 2014-12-25 NOTE — Assessment & Plan Note (Signed)
Chronic  On Lovastatin

## 2014-12-25 NOTE — Progress Notes (Signed)
Subjective:  Patient ID: Joe [name], male    DOB: 12/03/1945  Age: 69 y.o. MRN: [id number]  CC: No chief complaint on file.   HPI Joe [name] presents for hypothyroidism, dyslipidemia, wt gain. He was diagnosed w/CLL - stage 0. 4 cats at home. Working 3d/wk  Outpatient Prescriptions Prior to Visit  Medication Sig Dispense Refill  . amLODipine (NORVASC) 5 MG tablet TAKE 1 TABLET BY MOUTH DAILY 90 tablet 3  . aspirin (BAYER ASPIRIN) 325 MG tablet Take 1 tablet (325 mg total) by mouth daily. 100 tablet 3  . Cholecalciferol (EQL VITAMIN D3) 1000 UNITS tablet Take 1,000 Units by mouth daily.      . clotrimazole-betamethasone (LOTRISONE) cream Apply topically 2 (two) times daily. 45 g 2  . diazepam (VALIUM) 5 MG tablet TAKE 1 TABLET BY MOUTH EVERY 12 HOURS AS NEEDED FOR ANXIETY 60 tablet 3  . levothyroxine (LEVOTHROID) 25 MCG tablet Take 1 tablet (25 mcg total) by mouth daily. 30 tablet 11  . loteprednol (ALREX) 0.2 % SUSP Place 1 drop into the left eye 4 (four) times daily. 10 mL 5  . lovastatin (MEVACOR) 20 MG tablet TAKE 1 TABLET BY MOUTH AT BEDTIME 90 tablet 3  . Multiple Vitamin (MULTIVITAMIN) tablet Take 1 tablet by mouth daily. Centrum Silver.    . polyethylene glycol powder (MIRALAX) powder Take 1 Container by mouth as needed (pt states he takes this 1-2 x a week as needed for constipation).     Facility-Administered Medications Prior to Visit  Medication Dose Route Frequency Provider Last Rate Last Dose  . pneumococcal 23 valent vaccine (PNU-IMMUNE) injection 0.5 mL  0.5 mL Intramuscular Tomorrow-1000 Aleksei [name] V, MD        ROS Review of Systems  Constitutional: Positive for fatigue and unexpected weight change. Negative for appetite change.  HENT: Negative for congestion, nosebleeds, sneezing, sore throat and trouble swallowing.   Eyes: Negative for itching and visual disturbance.  Respiratory: Negative for cough.   Cardiovascular: Negative for chest pain,  palpitations and leg swelling.  Gastrointestinal: Negative for nausea, diarrhea, blood in stool and abdominal distention.  Genitourinary: Negative for frequency and hematuria.  Musculoskeletal: Negative for back pain, joint swelling, gait problem and neck pain.  Skin: Negative for rash.  Neurological: Negative for dizziness, tremors, speech difficulty and weakness.  Psychiatric/Behavioral: Negative for sleep disturbance, dysphoric mood and agitation. The patient is not nervous/anxious.     Objective:  BP 138/80 mmHg  Pulse 62  Wt 201 lb (91.173 kg)  SpO2 93%  BP Readings from Last 3 Encounters:  12/25/14 138/80  09/12/14 120/76  08/08/14 147/81    Wt Readings from Last 3 Encounters:  12/25/14 201 lb (91.173 kg)  09/12/14 194 lb (87.998 kg)  08/29/14 194 lb 12.8 oz (88.361 kg)    Physical Exam  Constitutional: He is oriented to person, place, and time. He appears well-developed. No distress.  NAD  HENT:  Mouth/Throat: Oropharynx is clear and moist.  Eyes: Conjunctivae are normal. Pupils are equal, round, and reactive to light.  Neck: Normal range of motion. No JVD present. No thyromegaly present.  Cardiovascular: Normal rate, regular rhythm, normal heart sounds and intact distal pulses.  Exam reveals no gallop and no friction rub.   No murmur heard. Pulmonary/Chest: Effort normal and breath sounds normal. No respiratory distress. He has no wheezes. He has no rales. He exhibits no tenderness.  Abdominal: Soft. Bowel sounds are normal. He exhibits no distension and no  mass. There is no tenderness. There is no rebound and no guarding.  Musculoskeletal: Normal range of motion. He exhibits no edema or tenderness.  Lymphadenopathy:    He has no cervical adenopathy.  Neurological: He is alert and oriented to person, place, and time. He has normal reflexes. No cranial nerve deficit. He exhibits normal muscle tone. He displays a negative Romberg sign. Coordination and gait normal.    Skin: Skin is warm and dry. No rash noted.  Psychiatric: He has a normal mood and affect. His behavior is normal. Judgment and thought content normal.    Lab Results  Component Value Date   WBC 14.5* 10/31/2014   HGB 15.5 10/31/2014   HCT 46.9 10/31/2014   PLT 215 10/31/2014   GLUCOSE 98 12/25/2014   CHOL 179 06/25/2014   TRIG 199.0* 06/25/2014   HDL 35.20* 06/25/2014   LDLDIRECT 133.9 03/12/2013   LDLCALC 104* 06/25/2014   ALT 45 10/31/2014   AST 28 10/31/2014   NA 141 12/25/2014   K 4.7 12/25/2014   CL 103 12/25/2014   CREATININE 0.82 12/25/2014   BUN 7 12/25/2014   CO2 32 12/25/2014   TSH 4.89* 12/25/2014   PSA 0.33 06/25/2014    US Abdomen Complete  07/25/2014   CLINICAL DATA:  Elevated lymphocytes level ; assess status of the liver and spleen.  EXAM: ULTRASOUND ABDOMEN COMPLETE  COMPARISON:  None.  FINDINGS: Gallbladder: The gallbladder is adequately distended. There are multiple echogenic mobile shadowing stones. There is no gallbladder wall thickening or pericholecystic fluid or positive sonographic Murphy's sign.  Common bile duct: Diameter: 4.4 mm  Liver: The hepatic echotexture is mildly increased. There is no focal mass or ductal dilation.  IVC: No abnormality visualized.  Pancreas: Visualized portion unremarkable.  Spleen: Spleen is normal in size and echotexture.  Right Kidney: Length: 12.0. Echogenicity within normal limits. No mass or hydronephrosis visualized.  Left Kidney: Length: 12.6 Cm. Echogenicity within normal limits. There is a parapelvic cyst. No hydronephrosis visualized.  Abdominal aorta: No aneurysm visualized.  Other findings: There is no ascites.  IMPRESSION: 1. Gallstones without evidence of acute cholecystitis. 2. Increased hepatic echotexture is consistent with fatty infiltration. The S hepatic and splenic sizes are normal. 3. The kidneys are normal.   Electronically Signed   By: David  Martinique M.D.   On: 07/25/2014 13:40    Assessment & Plan:    Diagnoses and all orders for this visit:  Hypothyroidism, unspecified hypothyroidism type -     Basic metabolic panel; Future -     TSH; Future  Dyslipidemia  CLL (chronic lymphocytic leukemia) (HCC)  DOE (dyspnea on exertion)  Need for influenza vaccination -     Flu Vaccine QUAD 36+ mos IM   I am having Mr. Joslin maintain his Cholecalciferol, loteprednol, clotrimazole-betamethasone, aspirin, lovastatin, amLODipine, levothyroxine, multivitamin, polyethylene glycol powder, and diazepam. We will continue to administer pneumococcal 23 valent vaccine.  No orders of the defined types were placed in this encounter.     Follow-up: Return in about 6 months (around 06/25/2015) for Wellness Exam.  Walker Kehr, MD

## 2014-12-25 NOTE — Assessment & Plan Note (Signed)
Amlodipine.

## 2014-12-25 NOTE — Assessment & Plan Note (Signed)
2016 Dr Ermelinda Das Stage 0

## 2014-12-25 NOTE — Assessment & Plan Note (Signed)
1/16 deconditioning, COPD vs other

## 2014-12-26 ENCOUNTER — Other Ambulatory Visit: Payer: Self-pay | Admitting: Internal Medicine

## 2014-12-26 MED ORDER — LEVOTHYROXINE SODIUM 50 MCG PO TABS: 50.0000 ug | ORAL_TABLET | Freq: Every day | ORAL | Status: DC

## 2015-01-23 ENCOUNTER — Other Ambulatory Visit (HOSPITAL_BASED_OUTPATIENT_CLINIC_OR_DEPARTMENT_OTHER): Payer: Medicare Other

## 2015-01-23 ENCOUNTER — Telehealth: Payer: Self-pay | Admitting: Hematology

## 2015-01-23 ENCOUNTER — Ambulatory Visit (HOSPITAL_BASED_OUTPATIENT_CLINIC_OR_DEPARTMENT_OTHER): Payer: Medicare Other | Admitting: Hematology

## 2015-01-23 ENCOUNTER — Encounter: Payer: Self-pay | Admitting: Hematology

## 2015-01-23 VITALS — BP 131/77 | HR 61 | Temp 97.9°F | Resp 20 | Ht 71.0 in | Wt 195.6 lb

## 2015-01-23 DIAGNOSIS — C9110 Chronic lymphocytic leukemia of B-cell type not having achieved remission: Secondary | ICD-10-CM

## 2015-01-23 DIAGNOSIS — I10 Essential (primary) hypertension: Secondary | ICD-10-CM

## 2015-01-23 LAB — CBC WITH DIFFERENTIAL/PLATELET
BASO%: 0.5 % (ref 0.0–2.0)
Basophils Absolute: 0.1 10*3/uL (ref 0.0–0.1)
EOS%: 1.3 % (ref 0.0–7.0)
Eosinophils Absolute: 0.2 10*3/uL (ref 0.0–0.5)
HCT: 46.2 % (ref 38.4–49.9)
HGB: 15.2 g/dL (ref 13.0–17.1)
LYMPH%: 52.3 % — AB (ref 14.0–49.0)
MCH: 29.9 pg (ref 27.2–33.4)
MCHC: 33 g/dL (ref 32.0–36.0)
MCV: 90.5 fL (ref 79.3–98.0)
MONO#: 0.9 10*3/uL (ref 0.1–0.9)
MONO%: 6.7 % (ref 0.0–14.0)
NEUT#: 5.4 10*3/uL (ref 1.5–6.5)
NEUT%: 39.2 % (ref 39.0–75.0)
Platelets: 220 10*3/uL (ref 140–400)
RBC: 5.1 10*6/uL (ref 4.20–5.82)
RDW: 13.8 % (ref 11.0–14.6)
WBC: 13.8 10*3/uL — ABNORMAL HIGH (ref 4.0–10.3)
lymph#: 7.2 10*3/uL — ABNORMAL HIGH (ref 0.9–3.3)

## 2015-01-23 LAB — COMPREHENSIVE METABOLIC PANEL (CC13)
ALBUMIN: 4 g/dL (ref 3.5–5.0)
ALK PHOS: 84 U/L (ref 40–150)
ALT: 41 U/L (ref 0–55)
AST: 30 U/L (ref 5–34)
Anion Gap: 5 mEq/L (ref 3–11)
BILIRUBIN TOTAL: 0.55 mg/dL (ref 0.20–1.20)
BUN: 5.2 mg/dL — AB (ref 7.0–26.0)
CO2: 27 mEq/L (ref 22–29)
CREATININE: 0.8 mg/dL (ref 0.7–1.3)
Calcium: 9.5 mg/dL (ref 8.4–10.4)
Chloride: 109 mEq/L (ref 98–109)
GLUCOSE: 107 mg/dL (ref 70–140)
Potassium: 4.2 mEq/L (ref 3.5–5.1)
SODIUM: 140 meq/L (ref 136–145)
TOTAL PROTEIN: 6.9 g/dL (ref 6.4–8.3)

## 2015-01-23 LAB — TECHNOLOGIST REVIEW

## 2015-01-23 NOTE — Progress Notes (Signed)
[place]  [phone] [phone]  Clinic follow up Note   Patient Care Team: Cassandria Anger, MD as PCP - General 01/23/2015  CHIEF COMPLAINTS:  CLL   HISTORY OF PRESENTING ILLNESS:  Joe [name] 69 y.o. male is here because of lymphocytosis.   He had a normal CBC in 2011and 2012. He was noticed to have gradually increased WBC since 2012, from 12.1-16.3K, with differential showing increased absolute lymphocytes, from 5.4-9.6K (see below).   He has been feeling fatigued in the past few years, which he feels related to his sedentary life style. He has some muscular and joints stiffness in the morning, which gets better through day time and after exercise. He has chronic mild arthritis which is stable. No other complains,. No fever, chills or night sweats. He gained 20 lbs since he retired 2 years ago.   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   He is also the care giver of his wife, who has COPD and use oxygen at night.   INTERIM HISTORY: He returns for follow up. He is doing well overall. He unfortunately lost his wife last week, who had COPD exacerbation and was in ICU for 10 days. He is accompanied by his son to the clinic today, or flew from [place] to be with him this week. He is still grieving, but coping well. He works part-time, 3 days a week, mild fatigue, able to do well. He denies any fever, chill, night sweats or other new symptoms. His weight is stable. He was found to have hypothyroidism a few months ago, and his Synthroid dose has been adjusted recently.  MEDICAL HISTORY:  Past Medical History  Diagnosis Date  . Hyperlipidemia   . GERD (gastroesophageal reflux disease)   . Hypertension   . 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 Paresis ([place])     right- s/p cerv  decompression  . COPD (chronic obstructive pulmonary disease) (HCC)     sats 93-97 % per pt.  . Irregular heart beat     benign per pt.  . 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 Cataract   . Thyroid disease     SURGICAL HISTORY: Past Surgical History  Procedure Laterality Date  . Posterior laminectomy / decompression cervical spine      Dr Saintclair Halsted  . Rotator cuff repair  2004    right  . Retinal detachment surgery      left eye, 2007 x2, 2008 x 3  . Tonsillectomy  [id number]  . Biceps tendon repair    . Cataract extraction Left   . Colonoscopy  04-14-99    Dr Flossie Dibble polyp-TA in epic  . Polypectomy  04-14-99    SOCIAL HISTORY: Social History   Social History  . Marital Status: Married    Spouse Name: N/A  . Number of Children: N/A  . Years of Education: N/A   Occupational History  . Not on file.   Social History Main Topics  . Smoking status: Former Smoker -- 0.80 packs/day    Types: Cigarettes    Quit date: 08/20/2012  . Smokeless tobacco: Never Used  . Alcohol Use: No     Comment: he used to drink moderate to heayly  for 45 years, quit one year ago   . Drug Use: No  . Sexual Activity: Yes   Other Topics Concern  . Not on file   Social History Narrative    FAMILY HISTORY: Family History  Problem Relation Age of Onset  . Coronary artery disease Other   . COPD Mother   . Diabetes Father   . Cancer Paternal Aunt 82    breast cancer   . Colon cancer Neg Hx     ALLERGIES:  has No Known Allergies.  MEDICATIONS:  Current Outpatient Prescriptions  Medication Sig Dispense Refill  . amLODipine (NORVASC) 5 MG tablet TAKE 1 TABLET BY MOUTH DAILY 90 tablet 3  . aspirin (BAYER ASPIRIN) 325 MG tablet Take 1 tablet (325 mg total) by mouth daily. 100 tablet 3  . Cholecalciferol (EQL VITAMIN D3) 1000 UNITS tablet Take 1,000 Units by mouth daily.      . clotrimazole-betamethasone (LOTRISONE) cream Apply topically 2 (two)  times daily. 45 g 2  . diazepam (VALIUM) 5 MG tablet TAKE 1 TABLET BY MOUTH EVERY 12 HOURS AS NEEDED FOR ANXIETY 60 tablet 3  . levothyroxine (SYNTHROID, LEVOTHROID) 50 MCG tablet Take 1 tablet (50 mcg total) by mouth daily. 90 tablet 3  . loteprednol (ALREX) 0.2 % SUSP Place 1 drop into the left eye 4 (four) times daily. 10 mL 5  . lovastatin (MEVACOR) 20 MG tablet TAKE 1 TABLET BY MOUTH AT BEDTIME 90 tablet 3  . Multiple Vitamin (MULTIVITAMIN) tablet Take 1 tablet by mouth daily. Centrum Silver.    . polyethylene glycol powder (MIRALAX) powder Take 1 Container by mouth as needed (pt states he takes this 1-2 x a week as needed for constipation).     Current Facility-Administered Medications  Medication Dose Route Frequency Provider Last Rate Last Dose  . pneumococcal 23 valent vaccine (PNU-IMMUNE) injection 0.5 mL  0.5 mL Intramuscular Tomorrow-1000 Aleksei [name] V, MD        REVIEW OF SYSTEMS:   Constitutional: Denies fevers, chills or abnormal night sweats Eyes: Denies blurriness of vision, double vision or watery eyes Ears, nose, mouth, throat, and face: Denies mucositis or sore throat Respiratory: Denies cough, dyspnea or wheezes Cardiovascular: Denies palpitation, chest discomfort or lower extremity swelling Gastrointestinal:  Denies nausea, heartburn or change in bowel habits Skin: Denies abnormal skin rashes Lymphatics: Denies new lymphadenopathy or easy bruising Neurological:Denies numbness, tingling or new weaknesses Behavioral/Psych: Mood is stable, no new changes  All other systems were reviewed with the patient and are negative.  PHYSICAL EXAMINATION: ECOG PERFORMANCE STATUS: 1 - Symptomatic but completely ambulatory  Filed Vitals:   01/23/15 1042  BP: 131/77  Pulse: 61  Temp: 97.9 F (36.6 C)  Resp: 20   Filed Weights   01/23/15 1042  Weight: 195 lb 9.6 oz (88.724 kg)    GENERAL:alert, no distress and comfortable SKIN: skin color, texture, turgor are  normal, no rashes or significant lesions EYES: normal, conjunctiva are pink and non-injected, sclera clear OROPHARYNX:no exudate, no erythema and lips, buccal mucosa, and tongue normal  NECK: supple, thyroid normal size, non-tender, without nodularity LYMPH:  no palpable lymphadenopathy in the cervical, axillary or inguinal LUNGS: clear to auscultation and percussion with normal breathing effort HEART: regular rate & rhythm and no murmurs and no lower extremity edema ABDOMEN:abdomen soft, non-tender and normal bowel sounds Musculoskeletal:no cyanosis of digits and no clubbing  PSYCH: alert & oriented x 3 with fluent speech NEURO: no focal motor/sensory deficits  LABORATORY DATA:  I have reviewed the data as listed CBC Latest Ref Rng 01/23/2015 10/31/2014 07/18/2014  WBC 4.0 - 10.3 10e3/uL 13.8(H) 14.5(H) 13.2(H)  Hemoglobin 13.0 - 17.1 g/dL 15.2 15.5 15.3  Hematocrit 38.4 - 49.9 % 46.2 46.9 46.0  Platelets 140 - 400 10e3/uL 220 215 197    CMP Latest Ref Rng 01/23/2015 12/25/2014 10/31/2014  Glucose 70 - 140 mg/dl 107 98 106  BUN 7.0 - 26.0 mg/dL 5.2(L) 7 9.0  Creatinine 0.7 - 1.3 mg/dL 0.8 0.82 0.8  Sodium 136 - 145 mEq/L 140 141 142  Potassium 3.5 - 5.1 mEq/L 4.2 4.7 4.4  Chloride 96 - 112 mEq/L - 103 -  CO2 22 - 29 mEq/L 27 32 28  Calcium 8.4 - 10.4 mg/dL 9.5 10.0 9.3  Total Protein 6.4 - 8.3 g/dL 6.9 - 7.0  Total Bilirubin 0.20 - 1.20 mg/dL 0.55 - 0.99  Alkaline Phos 40 - 150 U/L 84 - 82  AST 5 - 34 U/L 30 - 28  ALT 0 - 55 U/L 41 - 45    Interpretation Peripheral Blood Flow Cytometry - FINDINGS CONSISTENT WITH CHRONIC LYMPHOCYTIC LEUKEMIA. Susanne Greenhouse MD Pathologist, Electronic Signature  RADIOGRAPHIC STUDIES: I have personally reviewed the radiological images as listed and agreed with the findings in the report. No results found.  ASSESSMENT & PLAN:  69 year old Caucasian male, with past medical history of hypertension, cervical spine stenosis status post surgery, who  was found to have gradually increased WBC with predominant lymphocytosis since 2012.  1. CLL, stage 0 -I reviewed his CBC and differential in the past few years with patient and his wife in details. 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 I reviewed his flow cytometry result, which confirms the diagnosis of CLL. -He has no lymphadenopathy, liver and spleen size are normal, no anemia or some cytopenia, he has stage 0 disease. He is clinically doing very well. -We discussed the nature history of CLL, and indications for treatment, and treatment options.  -No indication for treatment for now. -His immunoglobin level are normal, Hep B and C and HIV were negative. -I reviewed his lab results CBC and CMP with him and his son. His WBC is 13.8, with absolute lymphocytes count 7.2, which has been very stable over the past year. -We'll continue monitoring.  2. He will continue follow-up with his primary care physician for HTN and other medical issues.  Follow-up, CBC with differential in 3 months, and return to clinic in 6 months.   All questions were answered. The patient knows to call the clinic with any problems, questions or concerns. I spent 15 minutes counseling the patient face to face. The total time spent in the appointment was 20 minutes and more than 50% was on counseling.     Truitt Merle, MD 01/23/2015 10:55 AM

## 2015-01-23 NOTE — Telephone Encounter (Signed)
per pof to sch pt appt-gave pt copy of avs °

## 2015-02-27 ENCOUNTER — Other Ambulatory Visit: Payer: Self-pay | Admitting: Internal Medicine

## 2015-03-29 ENCOUNTER — Other Ambulatory Visit: Payer: Self-pay | Admitting: Internal Medicine

## 2015-04-24 ENCOUNTER — Other Ambulatory Visit (HOSPITAL_BASED_OUTPATIENT_CLINIC_OR_DEPARTMENT_OTHER): Payer: Medicare Other

## 2015-04-24 DIAGNOSIS — C9110 Chronic lymphocytic leukemia of B-cell type not having achieved remission: Secondary | ICD-10-CM

## 2015-04-24 LAB — CBC WITH DIFFERENTIAL/PLATELET
BASO%: 0.2 % (ref 0.0–2.0)
BASOS ABS: 0 10*3/uL (ref 0.0–0.1)
EOS ABS: 0.2 10*3/uL (ref 0.0–0.5)
EOS%: 1.7 % (ref 0.0–7.0)
HCT: 46.8 % (ref 38.4–49.9)
HGB: 15.5 g/dL (ref 13.0–17.1)
LYMPH%: 57.5 % — AB (ref 14.0–49.0)
MCH: 30.2 pg (ref 27.2–33.4)
MCHC: 33.1 g/dL (ref 32.0–36.0)
MCV: 91.2 fL (ref 79.3–98.0)
MONO#: 0.7 10*3/uL (ref 0.1–0.9)
MONO%: 5.9 % (ref 0.0–14.0)
NEUT#: 4.3 10*3/uL (ref 1.5–6.5)
NEUT%: 34.7 % — AB (ref 39.0–75.0)
NRBC: 0 % (ref 0–0)
PLATELETS: 199 10*3/uL (ref 140–400)
RBC: 5.13 10*6/uL (ref 4.20–5.82)
RDW: 13.2 % (ref 11.0–14.6)
WBC: 12.5 10*3/uL — ABNORMAL HIGH (ref 4.0–10.3)
lymph#: 7.2 10*3/uL — ABNORMAL HIGH (ref 0.9–3.3)

## 2015-04-24 LAB — TECHNOLOGIST REVIEW

## 2015-04-27 ENCOUNTER — Other Ambulatory Visit: Payer: Self-pay | Admitting: Internal Medicine

## 2015-06-26 ENCOUNTER — Ambulatory Visit (INDEPENDENT_AMBULATORY_CARE_PROVIDER_SITE_OTHER): Payer: Medicare Other

## 2015-06-26 VITALS — BP 144/80 | HR 60 | Ht 71.0 in | Wt 199.5 lb

## 2015-06-26 DIAGNOSIS — Z0000 Encounter for general adult medical examination without abnormal findings: Secondary | ICD-10-CM
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
## 2015-06-26 NOTE — Patient Instructions (Addendum)
Joe [name] , Thank you for taking time to come for your Medicare Wellness Visit. I appreciate your ongoing commitment to your health goals. Please review the following plan we discussed and let me know if I can assist you in the future.   Discussed weight loss and continued exercise May make apt with Dr. Alain Marion to discuss general orthopedic issues if home program does not help  Will take zostavax off list as oncologist has advised to not take this  These are the goals we discussed: Goals    . Exercise 150 minutes per week (moderate activity)     Will start doing stretches every day at some point  Will spend time outdoors; likes to walk;  [place]; Trails for walking; 4 mile/ set small goals on small trails and build endurance        This is a list of the screening recommended for you and due dates:  Health Maintenance  Topic Date Due  . Shingles Vaccine  03/08/2006  . Flu Shot  10/21/2015  . Tetanus Vaccine  05/15/2019  . Colon Cancer Screening  09/12/2019  .  Hepatitis C: One time screening is recommended by Center for Disease Control  (CDC) for  adults born from [birth year] through 1965.   Completed  . Pneumonia vaccines  Completed     Fall Prevention in the Home  Falls can cause injuries. They can happen to people of all ages. There are many things you can do to make your home safe and to help prevent falls.  WHAT CAN I DO ON THE OUTSIDE OF MY HOME?  Regularly fix the edges of walkways and driveways and fix any cracks.  Remove anything that might make you trip as you walk through a door, such as a raised step or threshold.  Trim any bushes or trees on the path to your home.  Use bright outdoor lighting.  Clear any walking paths of anything that might make someone trip, such as rocks or tools.  Regularly check to see if handrails are loose or broken. Make sure that both sides of any steps have handrails.  Any raised decks and porches should have guardrails on  the edges.  Have any leaves, snow, or ice cleared regularly.  Use sand or salt on walking paths during winter.  Clean up any spills in your garage right away. This includes oil or grease spills. WHAT CAN I DO IN THE BATHROOM?   Use night lights.  Install grab bars by the toilet and in the tub and shower. Do not use towel bars as grab bars.  Use non-skid mats or decals in the tub or shower.  If you need to sit down in the shower, use a plastic, non-slip stool.  Keep the floor dry. Clean up any water that spills on the floor as soon as it happens.  Remove soap buildup in the tub or shower regularly.  Attach bath mats securely with double-sided non-slip rug tape.  Do not have throw rugs and other things on the floor that can make you trip. WHAT CAN I DO IN THE BEDROOM?  Use night lights.  Make sure that you have a light by your bed that is easy to reach.  Do not use any sheets or blankets that are too big for your bed. They should not hang down onto the floor.  Have a firm chair that has side arms. You can use this for support while you get dressed.  Do not  have throw rugs and other things on the floor that can make you trip. WHAT CAN I DO IN THE KITCHEN?  Clean up any spills right away.  Avoid walking on wet floors.  Keep items that you use a lot in easy-to-reach places.  If you need to reach something above you, use a strong step stool that has a grab bar.  Keep electrical cords out of the way.  Do not use floor polish or wax that makes floors slippery. If you must use wax, use non-skid floor wax.  Do not have throw rugs and other things on the floor that can make you trip. WHAT CAN I DO WITH MY STAIRS?  Do not leave any items on the stairs.  Make sure that there are handrails on both sides of the stairs and use them. Fix handrails that are broken or loose. Make sure that handrails are as long as the stairways.  Check any carpeting to make sure that it is firmly  attached to the stairs. Fix any carpet that is loose or worn.  Avoid having throw rugs at the top or bottom of the stairs. If you do have throw rugs, attach them to the floor with carpet tape.  Make sure that you have a light switch at the top of the stairs and the bottom of the stairs. If you do not have them, ask someone to add them for you. WHAT ELSE CAN I DO TO HELP PREVENT FALLS?  Wear shoes that:  Do not have high heels.  Have rubber bottoms.  Are comfortable and fit you well.  Are closed at the toe. Do not wear sandals.  If you use a stepladder:  Make sure that it is fully opened. Do not climb a closed stepladder.  Make sure that both sides of the stepladder are locked into place.  Ask someone to hold it for you, if possible.  Clearly mark and make sure that you can see:  Any grab bars or handrails.  First and last steps.  Where the edge of each step is.  Use tools that help you move around (mobility aids) if they are needed. These include:  Canes.  Walkers.  Scooters.  Crutches.  Turn on the lights when you go into a dark area. Replace any light bulbs as soon as they burn out.  Set up your furniture so you have a clear path. Avoid moving your furniture around.  If any of your floors are uneven, fix them.  If there are any pets around you, be aware of where they are.  Review your medicines with your doctor. Some medicines can make you feel dizzy. This can increase your chance of falling. Ask your doctor what other things that you can do to help prevent falls.   This information is not intended to replace advice given to you by your health care provider. Make sure you discuss any questions you have with your health care provider.   Document Released: 01/02/2009 Document Revised: 07/23/2014 Document Reviewed: 04/12/2014 Elsevier Interactive Patient Education 2016 [place] Maintenance, Male A healthy lifestyle and preventative care can  promote health and wellness.  Maintain regular health, dental, and eye exams.  Eat a healthy diet. Foods like vegetables, fruits, whole grains, low-fat dairy products, and lean protein foods contain the nutrients you need and are low in calories. Decrease your intake of foods high in solid fats, added sugars, and salt. Get information about a proper diet from your health  care provider, if necessary.  Regular physical exercise is one of the most important things you can do for your health. Most adults should get at least 150 minutes of moderate-intensity exercise (any activity that increases your heart rate and causes you to sweat) each week. In addition, most adults need muscle-strengthening exercises on 2 or more days a week.   Maintain a healthy weight. The body mass index (BMI) is a screening tool to identify possible weight problems. It provides an estimate of body fat based on height and weight. Your health care provider can find your BMI and can help you achieve or maintain a healthy weight. For males 20 years and older:  A BMI below 18.5 is considered underweight.  A BMI of 18.5 to 24.9 is normal.  A BMI of 25 to 29.9 is considered overweight.  A BMI of 30 and above is considered obese.  Maintain normal blood lipids and cholesterol by exercising and minimizing your intake of saturated fat. Eat a balanced diet with plenty of fruits and vegetables. Blood tests for lipids and cholesterol should begin at age 63 and be repeated every 5 years. If your lipid or cholesterol levels are high, you are over age 33, or you are at high risk for heart disease, you may need your cholesterol levels checked more frequently.Ongoing high lipid and cholesterol levels should be treated with medicines if diet and exercise are not working.  If you smoke, find out from your health care provider how to quit. If you do not use tobacco, do not start.  Lung cancer screening is recommended for adults aged 26-80  years who are at high risk for developing lung cancer because of a history of smoking. A yearly low-dose CT scan of the lungs is recommended for people who have at least a 30-pack-year history of smoking and are current smokers or have quit within the past 15 years. A pack year of smoking is smoking an average of 1 pack of cigarettes a day for 1 year (for example, a 30-pack-year history of smoking could mean smoking 1 pack a day for 30 years or 2 packs a day for 15 years). Yearly screening should continue until the smoker has stopped smoking for at least 15 years. Yearly screening should be stopped for people who develop a health problem that would prevent them from having lung cancer treatment.  If you choose to drink alcohol, do not have more than 2 drinks per day. One drink is considered to be 12 oz (360 mL) of beer, 5 oz (150 mL) of wine, or 1.5 oz (45 mL) of liquor.  Avoid the use of street drugs. Do not share needles with anyone. Ask for help if you need support or instructions about stopping the use of drugs.  High blood pressure causes heart disease and increases the risk of stroke. High blood pressure is more likely to develop in:  People who have blood pressure in the end of the normal range (100-139/85-89 mm Hg).  People who are overweight or obese.  People who are African American.  If you are 40-35 years of age, have your blood pressure checked every 3-5 years. If you are 74 years of age or older, have your blood pressure checked every year. You should have your blood pressure measured twice--once when you are at a hospital or clinic, and once when you are not at a hospital or clinic. Record the average of the two measurements. To check your blood pressure when you  are not at a hospital or clinic, you can use:  An automated blood pressure machine at a pharmacy.  A home blood pressure monitor.  If you are 76-83 years old, ask your health care provider if you should take aspirin to  prevent heart disease.  Diabetes screening involves taking a blood sample to check your fasting blood sugar level. This should be done once every 3 years after age 66 if you are at a normal weight and without risk factors for diabetes. Testing should be considered at a younger age or be carried out more frequently if you are overweight and have at least 1 risk factor for diabetes.  Colorectal cancer can be detected and often prevented. Most routine colorectal cancer screening begins at the age of 13 and continues through age 46. However, your health care provider may recommend screening at an earlier age if you have risk factors for colon cancer. On a yearly basis, your health care provider may provide home test kits to check for hidden blood in the stool. A small camera at the end of a tube may be used to directly examine the colon (sigmoidoscopy or colonoscopy) to detect the earliest forms of colorectal cancer. Talk to your health care provider about this at age 49 when routine screening begins. A direct exam of the colon should be repeated every 5-10 years through age 16, unless early forms of precancerous polyps or small growths are found.  People who are at an increased risk for hepatitis B should be screened for this virus. You are considered at high risk for hepatitis B if:  You were born in a country where hepatitis B occurs often. Talk with your health care provider about which countries are considered high risk.  Your parents were born in a high-risk country and you have not received a shot to protect against hepatitis B (hepatitis B vaccine).  You have HIV or AIDS.  You use needles to inject street drugs.  You live with, or have sex with, someone who has hepatitis B.  You are a man who has sex with other men (MSM).  You get hemodialysis treatment.  You take certain medicines for conditions like cancer, organ transplantation, and autoimmune conditions.  Hepatitis C blood testing is  recommended for all people born from [birth year] through 1965 and any individual with known risk factors for hepatitis C.  Healthy men should no longer receive prostate-specific antigen (PSA) blood tests as part of routine cancer screening. Talk to your health care provider about prostate cancer screening.  Testicular cancer screening is not recommended for adolescents or adult males who have no symptoms. Screening includes self-exam, a health care provider exam, and other screening tests. Consult with your health care provider about any symptoms you have or any concerns you have about testicular cancer.  Practice safe sex. Use condoms and avoid high-risk sexual practices to reduce the spread of sexually transmitted infections (STIs).  You should be screened for STIs, including gonorrhea and chlamydia if:  You are sexually active and are younger than 24 years.  You are older than 24 years, and your health care provider tells you that you are at risk for this type of infection.  Your sexual activity has changed since you were last screened, and you are at an increased risk for chlamydia or gonorrhea. Ask your health care provider if you are at risk.  If you are at risk of being infected with HIV, it is recommended that you take a  prescription medicine daily to prevent HIV infection. This is called pre-exposure prophylaxis (PrEP). You are considered at risk if:  You are a man who has sex with other men (MSM).  You are a heterosexual man who is sexually active with multiple partners.  You take drugs by injection.  You are sexually active with a partner who has HIV.  Talk with your health care provider about whether you are at high risk of being infected with HIV. If you choose to begin PrEP, you should first be tested for HIV. You should then be tested every 3 months for as long as you are taking PrEP.  Use sunscreen. Apply sunscreen liberally and repeatedly throughout the day. You should seek  shade when your shadow is shorter than you. Protect yourself by wearing long sleeves, pants, a wide-brimmed hat, and sunglasses year round whenever you are outdoors.  Tell your health care provider of new moles or changes in moles, especially if there is a change in shape or color. Also, tell your health care provider if a mole is larger than the size of a pencil eraser.  A one-time screening for abdominal aortic aneurysm (AAA) and surgical repair of large AAAs by ultrasound is recommended for men aged 78-75 years who are current or former smokers.  Stay current with your vaccines (immunizations).   This information is not intended to replace advice given to you by your health care provider. Make sure you discuss any questions you have with your health care provider.   Document Released: 09/04/2007 Document Revised: 03/29/2014 Document Reviewed: 08/03/2010 Elsevier Interactive Patient Education [employer].

## 2015-07-24 ENCOUNTER — Other Ambulatory Visit (HOSPITAL_BASED_OUTPATIENT_CLINIC_OR_DEPARTMENT_OTHER): Payer: Medicare Other

## 2015-07-24 ENCOUNTER — Telehealth: Payer: Self-pay | Admitting: Hematology

## 2015-07-24 ENCOUNTER — Ambulatory Visit (HOSPITAL_BASED_OUTPATIENT_CLINIC_OR_DEPARTMENT_OTHER): Payer: Medicare Other | Admitting: Hematology

## 2015-07-24 ENCOUNTER — Encounter: Payer: Self-pay | Admitting: Hematology

## 2015-07-24 VITALS — BP 127/63 | HR 65 | Temp 97.8°F | Resp 18 | Ht 71.0 in | Wt 198.6 lb

## 2015-07-24 DIAGNOSIS — I10 Essential (primary) hypertension: Secondary | ICD-10-CM

## 2015-07-24 DIAGNOSIS — C9110 Chronic lymphocytic leukemia of B-cell type not having achieved remission: Secondary | ICD-10-CM | POA: Diagnosis not present

## 2015-07-24 LAB — CBC WITH DIFFERENTIAL/PLATELET
BASO%: 0.5 % (ref 0.0–2.0)
BASOS ABS: 0.1 10*3/uL (ref 0.0–0.1)
EOS%: 1.9 % (ref 0.0–7.0)
Eosinophils Absolute: 0.3 10*3/uL (ref 0.0–0.5)
HEMATOCRIT: 49.3 % (ref 38.4–49.9)
HEMOGLOBIN: 15.9 g/dL (ref 13.0–17.1)
LYMPH#: 7.2 10*3/uL — AB (ref 0.9–3.3)
LYMPH%: 50.7 % — ABNORMAL HIGH (ref 14.0–49.0)
MCH: 28.8 pg (ref 27.2–33.4)
MCHC: 32.2 g/dL (ref 32.0–36.0)
MCV: 89.5 fL (ref 79.3–98.0)
MONO#: 1.1 10*3/uL — AB (ref 0.1–0.9)
MONO%: 7.7 % (ref 0.0–14.0)
NEUT#: 5.6 10*3/uL (ref 1.5–6.5)
NEUT%: 39.2 % (ref 39.0–75.0)
PLATELETS: 203 10*3/uL (ref 140–400)
RBC: 5.51 10*6/uL (ref 4.20–5.82)
RDW: 13.8 % (ref 11.0–14.6)
WBC: 14.3 10*3/uL — ABNORMAL HIGH (ref 4.0–10.3)

## 2015-07-24 LAB — TECHNOLOGIST REVIEW

## 2015-07-24 NOTE — Progress Notes (Signed)
[facility]  Telephone:(336) [phone] Fax:(336) [phone]  Clinic follow up Note   Patient Care Team: Cassandria Anger, MD as PCP - General 07/24/2015  CHIEF COMPLAINTS:  Follow up CLL   HISTORY OF PRESENTING ILLNESS (07/18/2014):  Joe [name] 70 y.o. male is here because of lymphocytosis.   He had a normal CBC in 2011and 2012. He was noticed to have gradually increased WBC since 2012, from 12.1-16.3K, with differential showing increased absolute lymphocytes, from 5.4-9.6K (see below).   He has been feeling fatigued in the past few years, which he feels related to his sedentary life style. He has some muscular and joints stiffness in the morning, which gets better through day time and after exercise. He has chronic mild arthritis which is stable. No other complains,. No fever, chills or night sweats. He gained 20 lbs since he retired 2 years ago.   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   He is also the care giver of his wife, who has COPD and use oxygen at night.   INTERIM HISTORY: He returns for follow up. He is doing well overall. He feels lonely since he lost his wife in October last year, but has been coping better lately. He works part-time, 3 days a week, tolerating well. He has mild fatigue, but functions very well at home. Denies any significant pain, fever, night sweats, or weight loss. No other complaints.  MEDICAL HISTORY:  Past Medical History  Diagnosis Date  . Hyperlipidemia   . GERD (gastroesophageal reflux disease)   . Hypertension   . 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 COPD (chronic obstructive pulmonary disease) ([facility])     sats 93-97 % per pt.  . Irregular heart beat     benign per pt.  . 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 Cataract   . Thyroid disease     SURGICAL HISTORY: Past Surgical History  Procedure Laterality Date  . Posterior laminectomy / decompression cervical spine      Dr Saintclair Halsted  . Rotator cuff repair  2004    right  . Retinal detachment surgery      left eye, 2007 x2, 2008 x 3  . Tonsillectomy  [id number]  . Biceps tendon repair    . Cataract extraction Left   . Colonoscopy  04-14-99    Dr Flossie Dibble polyp-TA in epic  . Polypectomy  04-14-99    SOCIAL HISTORY: Social History   Social History  . Marital Status: Married    Spouse Name: N/A  . Number of Children: N/A  . Years of Education: N/A   Occupational History  . Not on file.   Social History Main Topics  . Smoking status: Former Smoker -- 0.80 packs/day for 50 years    Types: Cigarettes    Quit date: 08/20/2012  . Smokeless tobacco: Never Used  . 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 Drug Use: No  . Sexual Activity: Yes   Other Topics Concern  .  Not on file   Social History Narrative    FAMILY HISTORY: Family History  Problem Relation Age of Onset  . Coronary artery disease Other   . COPD Mother   . Diabetes Father   . Cancer Paternal Aunt 55    breast cancer   . Colon cancer Neg Hx     ALLERGIES:  has No Known Allergies.  MEDICATIONS:  Current Outpatient Prescriptions  Medication Sig Dispense Refill  . aspirin (BAYER ASPIRIN) 325 MG tablet Take 1 tablet (325 mg total) by mouth daily. 100 tablet 3  . Cholecalciferol (EQL VITAMIN D3) 1000 UNITS tablet Take 1,000 Units by mouth daily.      . diazepam (VALIUM) 5 MG tablet TAKE 1 TABLET BY MOUTH EVERY 12 HOURS AS NEEDED FOR ANXIETY 60 tablet 3  . levothyroxine (SYNTHROID, LEVOTHROID) 50 MCG tablet Take 1 tablet (50 mcg total) by mouth daily. 90 tablet 3  . lovastatin (MEVACOR) 20 MG tablet TAKE 1 TABLET BY  MOUTH AT BEDTIME 90 tablet 3  . Multiple Vitamin (MULTIVITAMIN) tablet Take 1 tablet by mouth daily. Centrum Silver.    [name] amLODipine (NORVASC) 5 MG tablet TAKE 1 TABLET BY MOUTH DAILY 90 tablet 3  . clotrimazole-betamethasone (LOTRISONE) cream Apply topically 2 (two) times daily. (Patient not taking: Reported on 06/26/2015) 45 g 2   Current Facility-Administered Medications  Medication Dose Route Frequency Provider Last Rate Last Dose  . pneumococcal 23 valent vaccine (PNU-IMMUNE) injection 0.5 mL  0.5 mL Intramuscular Tomorrow-1000 Aleksei [name] V, MD        REVIEW OF SYSTEMS:   Constitutional: Denies fevers, chills or abnormal night sweats Eyes: Denies blurriness of vision, double vision or watery eyes Ears, nose, mouth, throat, and face: Denies mucositis or sore throat Respiratory: Denies cough, dyspnea or wheezes Cardiovascular: Denies palpitation, chest discomfort or lower extremity swelling Gastrointestinal:  Denies nausea, heartburn or change in bowel habits Skin: Denies abnormal skin rashes Lymphatics: Denies new lymphadenopathy or easy bruising Neurological:Denies numbness, tingling or new weaknesses Behavioral/Psych: Mood is stable, no new changes  All other systems were reviewed with the patient and are negative.  PHYSICAL EXAMINATION: ECOG PERFORMANCE STATUS: 1 - Symptomatic but completely ambulatory  Filed Vitals:   07/24/15 1000  BP: 127/63  Pulse: 65  Temp: 97.8 F (36.6 C)  Resp: 18   Filed Weights   07/24/15 1000  Weight: 198 lb 9.6 oz (90.084 kg)    GENERAL:alert, no distress and comfortable SKIN: skin color, texture, turgor are normal, no rashes or significant lesions EYES: normal, conjunctiva are pink and non-injected, sclera clear OROPHARYNX:no exudate, no erythema and lips, buccal mucosa, and tongue normal  NECK: supple, thyroid normal size, non-tender, without nodularity LYMPH:  no palpable lymphadenopathy in the cervical, axillary or  inguinal LUNGS: clear to auscultation and percussion with normal breathing effort HEART: regular rate & rhythm and no murmurs and no lower extremity edema ABDOMEN:abdomen soft, non-tender and normal bowel sounds Musculoskeletal:no cyanosis of digits and no clubbing  PSYCH: alert & oriented x 3 with fluent speech NEURO: no focal motor/sensory deficits  LABORATORY DATA:  I have reviewed the data as listed CBC Latest Ref Rng 07/24/2015 04/24/2015 01/23/2015  WBC 4.0 - 10.3 10e3/uL 14.3(H) 12.5(H) 13.8(H)  Hemoglobin 13.0 - 17.1 g/dL 15.9 15.5 15.2  Hematocrit 38.4 - 49.9 % 49.3 46.8 46.2  Platelets 140 - 400 10e3/uL 203 199 220    CMP Latest Ref Rng 01/23/2015 12/25/2014 10/31/2014  Glucose 70 - 140 mg/dl 107 98  106  BUN 7.0 - 26.0 mg/dL 5.2(L) 7 9.0  Creatinine 0.7 - 1.3 mg/dL 0.8 0.82 0.8  Sodium 136 - 145 mEq/L 140 141 142  Potassium 3.5 - 5.1 mEq/L 4.2 4.7 4.4  Chloride 96 - 112 mEq/L - 103 -  CO2 22 - 29 mEq/L 27 32 28  Calcium 8.4 - 10.4 mg/dL 9.5 10.0 9.3  Total Protein 6.4 - 8.3 g/dL 6.9 - 7.0  Total Bilirubin 0.20 - 1.20 mg/dL 0.55 - 0.99  Alkaline Phos 40 - 150 U/L 84 - 82  AST 5 - 34 U/L 30 - 28  ALT 0 - 55 U/L 41 - 45    Interpretation Peripheral Blood Flow Cytometry - FINDINGS CONSISTENT WITH CHRONIC LYMPHOCYTIC LEUKEMIA. Susanne Greenhouse MD Pathologist, Electronic Signature  RADIOGRAPHIC STUDIES: I have personally reviewed the radiological images as listed and agreed with the findings in the report. No results found.  ASSESSMENT & PLAN:  70 year old Caucasian male, with past medical history of hypertension, cervical spine stenosis status post surgery, who was found to have gradually increased WBC with predominant lymphocytosis since 2012.  1. CLL, stage 0 -I reviewed his CBC and differential in the past few years with patient and his wife in details. 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  I reviewed his flow cytometry result, which confirms the diagnosis of CLL. -He has no lymphadenopathy, liver and spleen size are normal, no anemia or thrombocytopenia, he has stage 0 disease. He is clinically doing very well. -We discussed the nature history of CLL, and indications for treatment, and treatment options.  -No indication for treatment for now. -His immunoglobin level are normal, Hep B and C and HIV were negative. -His WBC and absolute lymphocyte count has been stable overall since last year, no significant increase. This was reviewed with patient and I give him a copy of his blood test results.  2. He will continue follow-up with his primary care physician for HTN and other medical issues.  Follow-up, CBC with differential in 4 months, and return to clinic in 8 months.   All questions were answered. The patient knows to call the clinic with any problems, questions or concerns.  I spent 15 minutes counseling the patient face to face. The total time spent in the appointment was 20 minutes and more than 50% was on counseling.     Truitt Merle, MD 07/24/2015 10:41 AM

## 2015-07-24 NOTE — Telephone Encounter (Signed)
Gave pt appt & avs °

## 2015-08-28 DIAGNOSIS — H21541 Posterior synechiae (iris), right eye: Secondary | ICD-10-CM | POA: Diagnosis not present

## 2015-08-28 DIAGNOSIS — H04123 Dry eye syndrome of bilateral lacrimal glands: Secondary | ICD-10-CM | POA: Diagnosis not present

## 2015-08-28 DIAGNOSIS — H43813 Vitreous degeneration, bilateral: Secondary | ICD-10-CM | POA: Diagnosis not present

## 2015-08-28 DIAGNOSIS — H59812 Chorioretinal scars after surgery for detachment, left eye: Secondary | ICD-10-CM | POA: Diagnosis not present

## 2015-08-28 DIAGNOSIS — H40013 Open angle with borderline findings, low risk, bilateral: Secondary | ICD-10-CM | POA: Diagnosis not present

## 2015-08-28 DIAGNOSIS — Z961 Presence of intraocular lens: Secondary | ICD-10-CM | POA: Diagnosis not present

## 2015-08-28 DIAGNOSIS — H25811 Combined forms of age-related cataract, right eye: Secondary | ICD-10-CM | POA: Diagnosis not present

## 2015-08-31 ENCOUNTER — Other Ambulatory Visit: Payer: Self-pay | Admitting: Internal Medicine

## 2015-09-03 NOTE — Telephone Encounter (Signed)
Called refill into [facility] had to leave on pharmacy vm../lmb 

## 2015-11-19 ENCOUNTER — Other Ambulatory Visit: Payer: Self-pay | Admitting: *Deleted

## 2015-11-19 DIAGNOSIS — C9110 Chronic lymphocytic leukemia of B-cell type not having achieved remission: Secondary | ICD-10-CM

## 2015-11-20 ENCOUNTER — Other Ambulatory Visit (HOSPITAL_BASED_OUTPATIENT_CLINIC_OR_DEPARTMENT_OTHER): Payer: Medicare Other

## 2015-11-20 DIAGNOSIS — C9110 Chronic lymphocytic leukemia of B-cell type not having achieved remission: Secondary | ICD-10-CM

## 2015-11-20 LAB — CBC WITH DIFFERENTIAL/PLATELET
BASO%: 0.4 % (ref 0.0–2.0)
BASOS ABS: 0.1 10*3/uL (ref 0.0–0.1)
EOS%: 1.6 % (ref 0.0–7.0)
Eosinophils Absolute: 0.2 10*3/uL (ref 0.0–0.5)
HEMATOCRIT: 49.8 % (ref 38.4–49.9)
HGB: 16.2 g/dL (ref 13.0–17.1)
LYMPH#: 7.5 10*3/uL — AB (ref 0.9–3.3)
LYMPH%: 50.8 % — AB (ref 14.0–49.0)
MCH: 30 pg (ref 27.2–33.4)
MCHC: 32.6 g/dL (ref 32.0–36.0)
MCV: 92.2 fL (ref 79.3–98.0)
MONO#: 1.1 10*3/uL — AB (ref 0.1–0.9)
MONO%: 7.4 % (ref 0.0–14.0)
NEUT#: 5.8 10*3/uL (ref 1.5–6.5)
NEUT%: 39.8 % (ref 39.0–75.0)
PLATELETS: 217 10*3/uL (ref 140–400)
RBC: 5.4 10*6/uL (ref 4.20–5.82)
RDW: 14 % (ref 11.0–14.6)
WBC: 14.7 10*3/uL — ABNORMAL HIGH (ref 4.0–10.3)

## 2015-11-20 LAB — TECHNOLOGIST REVIEW

## 2015-12-11 ENCOUNTER — Ambulatory Visit (INDEPENDENT_AMBULATORY_CARE_PROVIDER_SITE_OTHER): Payer: Medicare Other

## 2015-12-11 DIAGNOSIS — Z23 Encounter for immunization: Secondary | ICD-10-CM | POA: Diagnosis not present

## 2016-01-27 ENCOUNTER — Other Ambulatory Visit: Payer: Self-pay | Admitting: Internal Medicine

## 2016-03-03 ENCOUNTER — Other Ambulatory Visit: Payer: Self-pay | Admitting: Internal Medicine

## 2016-03-24 ENCOUNTER — Other Ambulatory Visit: Payer: Self-pay | Admitting: *Deleted

## 2016-03-24 DIAGNOSIS — C9110 Chronic lymphocytic leukemia of B-cell type not having achieved remission: Secondary | ICD-10-CM

## 2016-03-25 ENCOUNTER — Other Ambulatory Visit: Payer: Medicare Other

## 2016-03-25 ENCOUNTER — Ambulatory Visit: Payer: Medicare Other | Admitting: Hematology

## 2016-03-25 ENCOUNTER — Telehealth: Payer: Self-pay | Admitting: Hematology
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
## 2016-03-25 NOTE — Progress Notes (Deleted)
[place]  [phone] [phone]  Clinic follow up Note   Patient Care Team: Cassandria Anger, MD as PCP - General 03/25/2016  CHIEF COMPLAINTS:  Follow up CLL   HISTORY OF PRESENTING ILLNESS (07/18/2014):  Joe [name] 71 y.o. male is here because of lymphocytosis.   He had a normal CBC in 2011and 2012. He was noticed to have gradually increased WBC since 2012, from 12.1-16.3K, with differential showing increased absolute lymphocytes, from 5.4-9.6K (see below).   He has been feeling fatigued in the past few years, which he feels related to his sedentary life style. He has some muscular and joints stiffness in the morning, which gets better through day time and after exercise. He has chronic mild arthritis which is stable. No other complains,. No fever, chills or night sweats. He gained 20 lbs since he retired 2 years ago.   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   He is also the care giver of his wife, who has COPD and use oxygen at night.   INTERIM HISTORY: He returns for follow up. He is doing well overall. He feels lonely since he lost his wife in October last year, but has been coping better lately. He works part-time, 3 days a week, tolerating well. He has mild fatigue, but functions very well at home. Denies any significant pain, fever, night sweats, or weight loss. No other complaints.  MEDICAL HISTORY:  Past Medical History:  Diagnosis Date  . Cataract   . 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 COPD (chronic obstructive pulmonary disease) (HCC)    sats 93-97 % per pt.  [name] GERD (gastroesophageal reflux disease)   . Hyperlipidemia   . Hypertension   . Irregular heart beat    benign per pt.  . Paresis ([place])    right-  s/p cerv decompression  . 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 Thyroid disease     SURGICAL HISTORY: Past Surgical History:  Procedure Laterality Date  . BICEPS TENDON REPAIR    . CATARACT EXTRACTION Left   . COLONOSCOPY  04-14-99   Dr Flossie Dibble polyp-TA in epic  . POLYPECTOMY  04-14-99  . POSTERIOR LAMINECTOMY / DECOMPRESSION CERVICAL SPINE     Dr Saintclair Halsted  . RETINAL DETACHMENT SURGERY     left eye, 2007 x2, 2008 x 3  . ROTATOR CUFF REPAIR  2004   right  . TONSILLECTOMY  4166,0630    SOCIAL HISTORY: Social History   Social History  . Marital status: Married    Spouse name: N/A  . Number of children: N/A  . Years of education: N/A   Occupational History  . Not on file.   Social History Main Topics  . Smoking status: Former Smoker    Packs/day: 0.80    Years: 50.00    Types: Cigarettes    Quit date: 08/20/2012  . Smokeless tobacco: Never Used  . 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 Drug use: No  . Sexual activity: Yes   Other Topics Concern  . Not on file  Social History Narrative  . No narrative on file    FAMILY HISTORY: Family History  Problem Relation Age of Onset  . Coronary artery disease Other   . COPD Mother   . Diabetes Father   . Cancer Paternal Aunt 9    breast cancer   . Colon cancer Neg Hx     ALLERGIES:  has No Known Allergies.  MEDICATIONS:  Current Outpatient Prescriptions  Medication Sig Dispense Refill  . amLODipine (NORVASC) 5 MG tablet TAKE 1 TABLET BY MOUTH DAILY 90 tablet 3  . aspirin (BAYER ASPIRIN) 325 MG tablet Take 1 tablet (325 mg total) by mouth daily. 100 tablet 3  . Cholecalciferol (EQL VITAMIN D3) 1000 UNITS tablet Take 1,000 Units by mouth daily.      . clotrimazole-betamethasone (LOTRISONE) cream Apply topically 2 (two) times daily. (Patient not taking: Reported on 06/26/2015) 45 g 2  . diazepam (VALIUM) 5 MG tablet TAKE 1  TABLET BY MOUTH EVERY 12 HOURS 60 tablet 3  . levothyroxine (SYNTHROID, LEVOTHROID) 50 MCG tablet TAKE 1 TABLET BY MOUTH EVERY DAY 90 tablet 0  . lovastatin (MEVACOR) 20 MG tablet TAKE 1 TABLET BY MOUTH AT BEDTIME 90 tablet 3  . Multiple Vitamin (MULTIVITAMIN) tablet Take 1 tablet by mouth daily. Centrum Silver.     Current Facility-Administered Medications  Medication Dose Route Frequency Provider Last Rate Last Dose  . pneumococcal 23 valent vaccine (PNU-IMMUNE) injection 0.5 mL  0.5 mL Intramuscular Tomorrow-1000 Cassandria Anger, MD        REVIEW OF SYSTEMS:   Constitutional: Denies fevers, chills or abnormal night sweats Eyes: Denies blurriness of vision, double vision or watery eyes Ears, nose, mouth, throat, and face: Denies mucositis or sore throat Respiratory: Denies cough, dyspnea or wheezes Cardiovascular: Denies palpitation, chest discomfort or lower extremity swelling Gastrointestinal:  Denies nausea, heartburn or change in bowel habits Skin: Denies abnormal skin rashes Lymphatics: Denies new lymphadenopathy or easy bruising Neurological:Denies numbness, tingling or new weaknesses Behavioral/Psych: Mood is stable, no new changes  All other systems were reviewed with the patient and are negative.  PHYSICAL EXAMINATION: ECOG PERFORMANCE STATUS: 1 - Symptomatic but completely ambulatory  There were no vitals filed for this visit. There were no vitals filed for this visit.  GENERAL:alert, no distress and comfortable SKIN: skin color, texture, turgor are normal, no rashes or significant lesions EYES: normal, conjunctiva are pink and non-injected, sclera clear OROPHARYNX:no exudate, no erythema and lips, buccal mucosa, and tongue normal  NECK: supple, thyroid normal size, non-tender, without nodularity LYMPH:  no palpable lymphadenopathy in the cervical, axillary or inguinal LUNGS: clear to auscultation and percussion with normal breathing effort HEART: regular rate &  rhythm and no murmurs and no lower extremity edema ABDOMEN:abdomen soft, non-tender and normal bowel sounds Musculoskeletal:no cyanosis of digits and no clubbing  PSYCH: alert & oriented x 3 with fluent speech NEURO: no focal motor/sensory deficits  LABORATORY DATA:  I have reviewed the data as listed CBC Latest Ref Rng & Units 11/20/2015 07/24/2015 04/24/2015  WBC 4.0 - 10.3 10e3/uL 14.7(H) 14.3(H) 12.5(H)  Hemoglobin 13.0 - 17.1 g/dL 16.2 15.9 15.5  Hematocrit 38.4 - 49.9 % 49.8 49.3 46.8  Platelets 140 - 400 10e3/uL 217 203 199    CMP Latest Ref Rng & Units 01/23/2015 12/25/2014 10/31/2014  Glucose 70 - 140 mg/dl 107 98 106  BUN 7.0 - 26.0 mg/dL 5.2(L) 7 9.0  Creatinine 0.7 - 1.3 mg/dL 0.8 0.82 0.8  Sodium 136 - 145  mEq/L 140 141 142  Potassium 3.5 - 5.1 mEq/L 4.2 4.7 4.4  Chloride 96 - 112 mEq/L - 103 -  CO2 22 - 29 mEq/L 27 32 28  Calcium 8.4 - 10.4 mg/dL 9.5 10.0 9.3  Total Protein 6.4 - 8.3 g/dL 6.9 - 7.0  Total Bilirubin 0.20 - 1.20 mg/dL 0.55 - 0.99  Alkaline Phos 40 - 150 U/L 84 - 82  AST 5 - 34 U/L 30 - 28  ALT 0 - 55 U/L 41 - 45    Interpretation Peripheral Blood Flow Cytometry - FINDINGS CONSISTENT WITH CHRONIC LYMPHOCYTIC LEUKEMIA. Joe Greenhouse MD Pathologist, Electronic Signature  RADIOGRAPHIC STUDIES: I have personally reviewed the radiological images as listed and agreed with the findings in the report. No results found.  ASSESSMENT & PLAN:  71 year old Caucasian male, with past medical history of hypertension, cervical spine stenosis status post surgery, who was found to have gradually increased WBC with predominant lymphocytosis since 2012.  1. CLL, stage 0 -I reviewed his CBC and differential in the past few years with patient and his wife in details. 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 I reviewed his flow cytometry result, which confirms the diagnosis of CLL. -He has no  lymphadenopathy, liver and spleen size are normal, no anemia or thrombocytopenia, he has stage 0 disease. He is clinically doing very well. -We discussed the nature history of CLL, and indications for treatment, and treatment options.  -No indication for treatment for now. -His immunoglobin level are normal, Hep B and C and HIV were negative. -His WBC and absolute lymphocyte count has been stable overall since last year, no significant increase. This was reviewed with patient and I give him a copy of his blood test results.  2. He will continue follow-up with his primary care physician for HTN and other medical issues.  Follow-up, CBC with differential in 4 months, and return to clinic in 8 months.   All questions were answered. The patient knows to call the clinic with any problems, questions or concerns.  I spent 15 minutes counseling the patient face to face. The total time spent in the appointment was 20 minutes and more than 50% was on counseling.     Joe Merle, MD 03/25/2016 6:25 AM

## 2016-04-01 ENCOUNTER — Telehealth: Payer: Self-pay | Admitting: Hematology

## 2016-04-01 ENCOUNTER — Ambulatory Visit (HOSPITAL_BASED_OUTPATIENT_CLINIC_OR_DEPARTMENT_OTHER): Payer: Medicare Other | Admitting: Hematology

## 2016-04-01 ENCOUNTER — Encounter: Payer: Self-pay | Admitting: Hematology

## 2016-04-01 ENCOUNTER — Other Ambulatory Visit (HOSPITAL_BASED_OUTPATIENT_CLINIC_OR_DEPARTMENT_OTHER): Payer: Medicare Other

## 2016-04-01 VITALS — BP 140/69 | HR 68 | Temp 98.0°F | Resp 18 | Ht 71.0 in | Wt 194.8 lb

## 2016-04-01 DIAGNOSIS — I10 Essential (primary) hypertension: Secondary | ICD-10-CM

## 2016-04-01 DIAGNOSIS — C9110 Chronic lymphocytic leukemia of B-cell type not having achieved remission: Secondary | ICD-10-CM

## 2016-04-01 LAB — CBC WITH DIFFERENTIAL/PLATELET
BASO%: 0.3 % (ref 0.0–2.0)
BASOS ABS: 0 10*3/uL (ref 0.0–0.1)
EOS%: 1.4 % (ref 0.0–7.0)
Eosinophils Absolute: 0.2 10*3/uL (ref 0.0–0.5)
HCT: 49.3 % (ref 38.4–49.9)
HGB: 15.9 g/dL (ref 13.0–17.1)
LYMPH%: 54.8 % — AB (ref 14.0–49.0)
MCH: 30.2 pg (ref 27.2–33.4)
MCHC: 32.3 g/dL (ref 32.0–36.0)
MCV: 93.5 fL (ref 79.3–98.0)
MONO#: 1.2 10*3/uL — AB (ref 0.1–0.9)
MONO%: 7.8 % (ref 0.0–14.0)
NEUT#: 5.5 10*3/uL (ref 1.5–6.5)
NEUT%: 35.7 % — AB (ref 39.0–75.0)
PLATELETS: 233 10*3/uL (ref 140–400)
RBC: 5.27 10*6/uL (ref 4.20–5.82)
RDW: 13.5 % (ref 11.0–14.6)
WBC: 15.3 10*3/uL — ABNORMAL HIGH (ref 4.0–10.3)
lymph#: 8.4 10*3/uL — ABNORMAL HIGH (ref 0.9–3.3)

## 2016-04-01 NOTE — Progress Notes (Signed)
[place]  [phone]) [phone] Fax:(336) [phone]  Clinic follow up Note   Patient Care Team: Cassandria Anger, MD as PCP - General 04/01/2016  CHIEF COMPLAINTS:  Follow up CLL   HISTORY OF PRESENTING ILLNESS (07/18/2014):  Joe [name] 71 y.o. male is here because of lymphocytosis.   He had a normal CBC in 2011and 2012. He was noticed to have gradually increased WBC since 2012, from 12.1-16.3K, with differential showing increased absolute lymphocytes, from 5.4-9.6K (see below).   He has been feeling fatigued in the past few years, which he feels related to his sedentary life style. He has some muscular and joints stiffness in the morning, which gets better through day time and after exercise. He has chronic mild arthritis which is stable. No other complains,. No fever, chills or night sweats. He gained 20 lbs since he retired 2 years ago.   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   He is also the care giver of his wife, who has COPD and use oxygen at night.   CURRENT THERAPY: Observation   INTERIM HISTORY: He returns for follow up. He is doing well overall overall. His wife passed in 2016, he is still mildly depressed, especially during the holiday, but overall better. He has not been very active since his wife passed away, but he plans to go back to gem and start her access again this year. He has mild fatigue, otherwise denies any fever, night sweats, or weight loss. No other complaints.  MEDICAL HISTORY:  Past Medical History:  Diagnosis Date  . Cataract   . 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 COPD (chronic obstructive pulmonary disease) (HCC)    sats 93-97 % per pt.  [name] GERD (gastroesophageal reflux disease)   . Hyperlipidemia   .  Hypertension   . Irregular heart beat    benign per pt.  . Paresis ([place])    right- s/p cerv decompression  . 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 Thyroid disease     SURGICAL HISTORY: Past Surgical History:  Procedure Laterality Date  . BICEPS TENDON REPAIR    . CATARACT EXTRACTION Left   . COLONOSCOPY  04-14-99   Dr Flossie Dibble polyp-TA in epic  . POLYPECTOMY  04-14-99  . POSTERIOR LAMINECTOMY / DECOMPRESSION CERVICAL SPINE     Dr Saintclair Halsted  . RETINAL DETACHMENT SURGERY     left eye, 2007 x2, 2008 x 3  . ROTATOR CUFF REPAIR  2004   right  . TONSILLECTOMY  [id number]    SOCIAL HISTORY: Social History   Social History  . Marital status: Married    Spouse name: N/A  . Number of children: N/A  . Years of education: N/A   Occupational History  . Not on file.   Social History Main Topics  . Smoking status: Former Smoker    Packs/day: 0.80    Years: 50.00    Types: Cigarettes    Quit date: 08/20/2012  . Smokeless tobacco: Never Used  . 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 Drug use: No  .  Sexual activity: Yes   Other Topics Concern  . Not on file   Social History Narrative  . No narrative on file    FAMILY HISTORY: Family History  Problem Relation Age of Onset  . Coronary artery disease Other   . COPD Mother   . Diabetes Father   . Cancer Paternal Aunt 61    breast cancer   . Colon cancer Neg Hx     ALLERGIES:  has No Known Allergies.  MEDICATIONS:  Current Outpatient Prescriptions  Medication Sig Dispense Refill  . amLODipine (NORVASC) 5 MG tablet TAKE 1 TABLET BY MOUTH DAILY 90 tablet 3  . aspirin (BAYER ASPIRIN) 325 MG tablet Take 1 tablet (325 mg total) by mouth daily. 100 tablet 3  . Cholecalciferol (EQL VITAMIN D3) 1000 UNITS tablet Take 1,000 Units by mouth daily.      . diazepam (VALIUM) 5 MG tablet TAKE 1 TABLET BY MOUTH EVERY 12 HOURS 60 tablet 3  .  levothyroxine (SYNTHROID, LEVOTHROID) 50 MCG tablet TAKE 1 TABLET BY MOUTH EVERY DAY 90 tablet 0  . lovastatin (MEVACOR) 20 MG tablet TAKE 1 TABLET BY MOUTH AT BEDTIME 90 tablet 3  . Multiple Vitamin (MULTIVITAMIN) tablet Take 1 tablet by mouth daily. Centrum Silver.     Current Facility-Administered Medications  Medication Dose Route Frequency Provider Last Rate Last Dose  . pneumococcal 23 valent vaccine (PNU-IMMUNE) injection 0.5 mL  0.5 mL Intramuscular Tomorrow-1000 Cassandria Anger, MD        REVIEW OF SYSTEMS:   Constitutional: Denies fevers, chills or abnormal night sweats Eyes: Denies blurriness of vision, double vision or watery eyes Ears, nose, mouth, throat, and face: Denies mucositis or sore throat Respiratory: Denies cough, dyspnea or wheezes Cardiovascular: Denies palpitation, chest discomfort or lower extremity swelling Gastrointestinal:  Denies nausea, heartburn or change in bowel habits Skin: Denies abnormal skin rashes Lymphatics: Denies new lymphadenopathy or easy bruising Neurological:Denies numbness, tingling or new weaknesses Behavioral/Psych: Mood is stable, no new changes  All other systems were reviewed with the patient and are negative.  PHYSICAL EXAMINATION: ECOG PERFORMANCE STATUS: 1 - Symptomatic but completely ambulatory  Vitals:   04/01/16 1359  BP: 140/69  Pulse: 68  Resp: 18  Temp: 98 F (36.7 C)   Filed Weights   04/01/16 1359  Weight: 194 lb 12.8 oz (88.4 kg)    GENERAL:alert, no distress and comfortable SKIN: skin color, texture, turgor are normal, no rashes or significant lesions EYES: normal, conjunctiva are pink and non-injected, sclera clear OROPHARYNX:no exudate, no erythema and lips, buccal mucosa, and tongue normal  NECK: supple, thyroid normal size, non-tender, without nodularity LYMPH:  no palpable lymphadenopathy in the cervical, axillary or inguinal LUNGS: clear to auscultation and percussion with normal breathing  effort HEART: regular rate & rhythm and no murmurs and no lower extremity edema ABDOMEN:abdomen soft, non-tender and normal bowel sounds Musculoskeletal:no cyanosis of digits and no clubbing  PSYCH: alert & oriented x 3 with fluent speech NEURO: no focal motor/sensory deficits  LABORATORY DATA:  I have reviewed the data as listed CBC Latest Ref Rng & Units 04/01/2016 11/20/2015 07/24/2015  WBC 4.0 - 10.3 10e3/uL 15.3(H) 14.7(H) 14.3(H)  Hemoglobin 13.0 - 17.1 g/dL 15.9 16.2 15.9  Hematocrit 38.4 - 49.9 % 49.3 49.8 49.3  Platelets 140 - 400 10e3/uL 233 217 203    CMP Latest Ref Rng & Units 01/23/2015 12/25/2014 10/31/2014  Glucose 70 - 140 mg/dl 107 98 106  BUN 7.0 - 26.0 mg/dL  5.2(L) 7 9.0  Creatinine 0.7 - 1.3 mg/dL 0.8 0.82 0.8  Sodium 136 - 145 mEq/L 140 141 142  Potassium 3.5 - 5.1 mEq/L 4.2 4.7 4.4  Chloride 96 - 112 mEq/L - 103 -  CO2 22 - 29 mEq/L 27 32 28  Calcium 8.4 - 10.4 mg/dL 9.5 10.0 9.3  Total Protein 6.4 - 8.3 g/dL 6.9 - 7.0  Total Bilirubin 0.20 - 1.20 mg/dL 0.55 - 0.99  Alkaline Phos 40 - 150 U/L 84 - 82  AST 5 - 34 U/L 30 - 28  ALT 0 - 55 U/L 41 - 45    Interpretation Peripheral Blood Flow Cytometry - FINDINGS CONSISTENT WITH CHRONIC LYMPHOCYTIC LEUKEMIA. Susanne Greenhouse MD Pathologist, Electronic Signature  RADIOGRAPHIC STUDIES: I have personally reviewed the radiological images as listed and agreed with the findings in the report. No results found.  ASSESSMENT & PLAN:  71 y.o. Caucasian male, with past medical history of hypertension, cervical spine stenosis status post surgery, who was found to have gradually increased WBC with predominant lymphocytosis since 2012.  1. CLL, stage 0 -I reviewed his CBC and differential in the past few years with patient and his wife in details. 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 I reviewed his flow cytometry result, which confirms the  diagnosis of CLL. -He has no lymphadenopathy, liver and spleen size are normal, no anemia or thrombocytopenia, he has stage 0 disease. He is clinically doing very well. -We discussed the nature history of CLL, and indications for treatment, and treatment options.  -No indication for treatment for now. -His immunoglobin level are normal, Hep B and C and HIV were negative. -His WBC and absolute lymphocyte count has been stable overall, slightly increased lately. This was reviewed with patient and I give him a copy of his blood test results.  2. He will continue follow-up with his primary care physician for HTN and other medical issues.  Follow-up, CBC with differential in 6 months  All questions were answered. The patient knows to call the clinic with any problems, questions or concerns.  I spent 10 minutes counseling the patient face to face. The total time spent in the appointment was 15 minutes and more than 50% was on counseling.     Truitt Merle, MD 04/01/2016 3:21 PM

## 2016-04-01 NOTE — Telephone Encounter (Signed)
Called patient to inform him of next scheduled appointments. °

## 2016-04-29 ENCOUNTER — Other Ambulatory Visit: Payer: Self-pay | Admitting: Internal Medicine

## 2016-05-06 ENCOUNTER — Other Ambulatory Visit: Payer: Self-pay | Admitting: Internal Medicine

## 2016-05-14 ENCOUNTER — Ambulatory Visit (INDEPENDENT_AMBULATORY_CARE_PROVIDER_SITE_OTHER): Payer: Medicare Other | Admitting: Internal Medicine

## 2016-05-14 ENCOUNTER — Encounter: Payer: Self-pay | Admitting: Internal Medicine

## 2016-05-14 ENCOUNTER — Other Ambulatory Visit (INDEPENDENT_AMBULATORY_CARE_PROVIDER_SITE_OTHER): Payer: Medicare Other

## 2016-05-14 VITALS — BP 150/88 | HR 74 | Temp 98.5°F | Resp 16 | Ht 71.0 in | Wt 196.5 lb

## 2016-05-14 DIAGNOSIS — E039 Hypothyroidism, unspecified: Secondary | ICD-10-CM

## 2016-05-14 DIAGNOSIS — N320 Bladder-neck obstruction: Secondary | ICD-10-CM | POA: Diagnosis not present

## 2016-05-14 DIAGNOSIS — I10 Essential (primary) hypertension: Secondary | ICD-10-CM

## 2016-05-14 DIAGNOSIS — F4321 Adjustment disorder with depressed mood: Secondary | ICD-10-CM

## 2016-05-14 DIAGNOSIS — F4320 Adjustment disorder, unspecified: Secondary | ICD-10-CM

## 2016-05-14 DIAGNOSIS — F411 Generalized anxiety disorder: Secondary | ICD-10-CM | POA: Diagnosis not present

## 2016-05-14 DIAGNOSIS — R7989 Other specified abnormal findings of blood chemistry: Secondary | ICD-10-CM | POA: Diagnosis not present

## 2016-05-14 DIAGNOSIS — Z0000 Encounter for general adult medical examination without abnormal findings: Secondary | ICD-10-CM

## 2016-05-14 DIAGNOSIS — C9110 Chronic lymphocytic leukemia of B-cell type not having achieved remission: Secondary | ICD-10-CM | POA: Diagnosis not present

## 2016-05-14 HISTORY — DX: Adjustment disorder with depressed mood: F43.21

## 2016-05-14 LAB — URINALYSIS
Bilirubin Urine: NEGATIVE
Hgb urine dipstick: NEGATIVE
KETONES UR: NEGATIVE
LEUKOCYTES UA: NEGATIVE
Nitrite: NEGATIVE
PH: 6.5 (ref 5.0–8.0)
Specific Gravity, Urine: 1.015 (ref 1.000–1.030)
Total Protein, Urine: NEGATIVE
Urine Glucose: NEGATIVE
Urobilinogen, UA: 0.2 (ref 0.0–1.0)

## 2016-05-14 LAB — PSA: PSA: 0.47 ng/mL (ref 0.10–4.00)

## 2016-05-14 LAB — LDL CHOLESTEROL, DIRECT: Direct LDL: 108 mg/dL

## 2016-05-14 LAB — HEPATIC FUNCTION PANEL
ALT: 42 U/L (ref 0–53)
AST: 34 U/L (ref 0–37)
Albumin: 4.4 g/dL (ref 3.5–5.2)
Alkaline Phosphatase: 70 U/L (ref 39–117)
BILIRUBIN TOTAL: 1.4 mg/dL — AB (ref 0.2–1.2)
Bilirubin, Direct: 0.3 mg/dL (ref 0.0–0.3)
Total Protein: 7.1 g/dL (ref 6.0–8.3)

## 2016-05-14 LAB — LIPID PANEL
CHOL/HDL RATIO: 4
Cholesterol: 189 mg/dL (ref 0–200)
HDL: 48.8 mg/dL (ref >39.00)
NonHDL: 140.48
Triglycerides: 213 mg/dL — ABNORMAL HIGH (ref 0.0–149.0)
VLDL: 42.6 mg/dL — AB (ref 0.0–40.0)

## 2016-05-14 LAB — BASIC METABOLIC PANEL
BUN: 10 mg/dL (ref 6–23)
CHLORIDE: 102 meq/L (ref 96–112)
CO2: 34 meq/L — AB (ref 19–32)
Calcium: 9.8 mg/dL (ref 8.4–10.5)
Creatinine, Ser: 0.78 mg/dL (ref 0.40–1.50)
GFR: 104.53 mL/min (ref >60.00)
GLUCOSE: 87 mg/dL (ref 70–99)
Potassium: 4.9 mEq/L (ref 3.5–5.1)
Sodium: 141 mEq/L (ref 135–145)

## 2016-05-14 LAB — TSH: TSH: 2.42 u[IU]/mL (ref 0.35–4.50)

## 2016-05-14 MED ORDER — AMLODIPINE BESYLATE 5 MG PO TABS: 5.0000 mg | ORAL_TABLET | Freq: Every day | ORAL | 3 refills | Status: DC

## 2016-05-14 MED ORDER — LEVOTHYROXINE SODIUM 50 MCG PO TABS: 50.0000 ug | ORAL_TABLET | Freq: Every day | ORAL | 2 refills | Status: DC

## 2016-05-14 MED ORDER — DIAZEPAM 5 MG PO TABS: 5.0000 mg | ORAL_TABLET | Freq: Two times a day (BID) | ORAL | 3 refills | Status: DC

## 2016-05-14 NOTE — Patient Instructions (Signed)

## 2016-05-14 NOTE — Assessment & Plan Note (Signed)
Chronic  Diazepam  Potential benefits of a long term benzodiazepines  use as well as potential risks  and complications were explained to the patient and were aknowledged.

## 2016-05-14 NOTE — Progress Notes (Signed)
Subjective:  Patient ID: Joe [name], male    DOB: [birth year]  Age: 71 y.o. MRN: [id number]  CC: Hypertension (medication refill); Anxiety (medication refill ); Thyroid Problem (medication refill ); and Hand Problem (stiffness )   HPI [name] presents for a well exam   Outpatient Medications Prior to Visit  Medication Sig Dispense Refill  . amLODipine (NORVASC) 5 MG tablet TAKE 1 TABLET BY MOUTH DAILY 90 tablet 3  . aspirin (BAYER ASPIRIN) 325 MG tablet Take 1 tablet (325 mg total) by mouth daily. 100 tablet 3  . Cholecalciferol (EQL VITAMIN D3) 1000 UNITS tablet Take 1,000 Units by mouth daily.      . diazepam (VALIUM) 5 MG tablet TAKE 1 TABLET BY MOUTH EVERY 12 HOURS 60 tablet 3  . levothyroxine (SYNTHROID, LEVOTHROID) 50 MCG tablet TAKE 1 TABLET BY MOUTH EVERY DAY 90 tablet 0  . lovastatin (MEVACOR) 20 MG tablet TAKE 1 TABLET BY MOUTH AT BEDTIME 90 tablet 3  . Multiple Vitamin (MULTIVITAMIN) tablet Take 1 tablet by mouth daily. Centrum Silver.     Facility-Administered Medications Prior to Visit  Medication Dose Route Frequency Provider Last Rate Last Dose  . pneumococcal 23 valent vaccine (PNU-IMMUNE) injection 0.5 mL  0.5 mL Intramuscular Tomorrow-1000 Aleksei [name] V, MD        ROS Review of Systems  Constitutional: Positive for fatigue. Negative for appetite change and unexpected weight change.  HENT: Negative for congestion, nosebleeds, sneezing, sore throat and trouble swallowing.   Eyes: Negative for itching and visual disturbance.  Respiratory: Negative for cough.   Cardiovascular: Negative for chest pain, palpitations and leg swelling.  Gastrointestinal: Negative for abdominal distention, blood in stool, diarrhea and nausea.  Genitourinary: Negative for frequency and hematuria.  Musculoskeletal: Positive for back pain and gait problem. Negative for joint swelling and neck pain.  Skin: Negative for rash.  Neurological: Negative for dizziness,  tremors, speech difficulty and weakness.  Psychiatric/Behavioral: Positive for dysphoric mood. Negative for agitation, sleep disturbance and suicidal ideas. The patient is nervous/anxious.     Objective:  BP (!) 150/88   Pulse 74   Temp 98.5 F (36.9 C) (Oral)   Resp 16   Ht '5\' 11"'$  (1.803 m)   Wt 196 lb 8 oz (89.1 kg)   SpO2 95%   BMI 27.41 kg/m   BP Readings from Last 3 Encounters:  05/14/16 (!) 150/88  04/01/16 140/69  07/24/15 127/63    Wt Readings from Last 3 Encounters:  05/14/16 196 lb 8 oz (89.1 kg)  04/01/16 194 lb 12.8 oz (88.4 kg)  07/24/15 198 lb 9.6 oz (90.1 kg)    Physical Exam  Constitutional: He is oriented to person, place, and time. He appears well-developed. No distress.  NAD  HENT:  Mouth/Throat: Oropharynx is clear and moist.  Eyes: Conjunctivae are normal. Pupils are equal, round, and reactive to light.  Neck: Normal range of motion. No JVD present. No thyromegaly present.  Cardiovascular: Normal rate, regular rhythm, normal heart sounds and intact distal pulses.  Exam reveals no gallop and no friction rub.   No murmur heard. Pulmonary/Chest: Effort normal and breath sounds normal. No respiratory distress. He has no wheezes. He has no rales. He exhibits no tenderness.  Abdominal: Soft. Bowel sounds are normal. He exhibits no distension and no mass. There is no tenderness. There is no rebound and no guarding.  Musculoskeletal: Normal range of motion. He exhibits no edema or tenderness.  Lymphadenopathy:  He has no cervical adenopathy.  Neurological: He is alert and oriented to person, place, and time. He has normal reflexes. No cranial nerve deficit. He exhibits normal muscle tone. He displays a negative Romberg sign. Coordination abnormal. Gait normal.  Skin: Skin is warm and dry. No rash noted.  Psychiatric: He has a normal mood and affect. His behavior is normal. Judgment and thought content normal.  pt declined rectal  Lab Results  Component  Value Date   WBC 15.3 (H) 04/01/2016   HGB 15.9 04/01/2016   HCT 49.3 04/01/2016   PLT 233 04/01/2016   GLUCOSE 107 01/23/2015   CHOL 179 06/25/2014   TRIG 199.0 (H) 06/25/2014   HDL 35.20 (L) 06/25/2014   LDLDIRECT 133.9 03/12/2013   LDLCALC 104 (H) 06/25/2014   ALT 41 01/23/2015   AST 30 01/23/2015   NA 140 01/23/2015   K 4.2 01/23/2015   CL 103 12/25/2014   CREATININE 0.8 01/23/2015   BUN 5.2 (L) 01/23/2015   CO2 27 01/23/2015   TSH 4.89 (H) 12/25/2014   PSA 0.33 06/25/2014    US Abdomen Complete  Result Date: 07/25/2014 CLINICAL DATA:  Elevated lymphocytes level ; assess status of the liver and spleen. EXAM: ULTRASOUND ABDOMEN COMPLETE COMPARISON:  None. FINDINGS: Gallbladder: The gallbladder is adequately distended. There are multiple echogenic mobile shadowing stones. There is no gallbladder wall thickening or pericholecystic fluid or positive sonographic Murphy's sign. Common bile duct: Diameter: 4.4 mm Liver: The hepatic echotexture is mildly increased. There is no focal mass or ductal dilation. IVC: No abnormality visualized. Pancreas: Visualized portion unremarkable. Spleen: Spleen is normal in size and echotexture. Right Kidney: Length: 12.0. Echogenicity within normal limits. No mass or hydronephrosis visualized. Left Kidney: Length: 12.6 Cm. Echogenicity within normal limits. There is a parapelvic cyst. No hydronephrosis visualized. Abdominal aorta: No aneurysm visualized. Other findings: There is no ascites. IMPRESSION: 1. Gallstones without evidence of acute cholecystitis. 2. Increased hepatic echotexture is consistent with fatty infiltration. The S hepatic and splenic sizes are normal. 3. The kidneys are normal. Electronically Signed   By: David  Martinique M.D.   On: 07/25/2014 13:40    Assessment & Plan:   There are no diagnoses linked to this encounter. I am having Mr. Gertsch maintain his Cholecalciferol, aspirin, multivitamin, amLODipine, diazepam, levothyroxine, and  lovastatin. We will continue to administer pneumococcal 23 valent vaccine.  No orders of the defined types were placed in this encounter.    Follow-up: No Follow-up on file.  Walker Kehr, MD

## 2016-05-14 NOTE — Assessment & Plan Note (Signed)
Here for medicare wellness/physical  Diet: heart healthy  Physical activity: sedentary  Depression/mood screen: negative  Hearing: intact to whispered voice  Visual acuity: grossly normal w/glasses, performs annual eye exam  ADLs: capable  Fall risk: low - cane Home safety: good  Cognitive evaluation: intact to orientation, naming, recall and repetition  EOL planning: adv directives, full code/ I agree  I have personally reviewed and have noted  1. The patient's medical, surgical and social history  2. Their use of alcohol, tobacco or illicit drugs  3. Their current medications and supplements  4. The patient's functional ability including ADL's, fall risks, home safety risks and hearing or visual impairment.  5. Diet and physical activities  6. Evidence for depression or mood disorders - grieving 7. The roster of all physicians providing medical care to patient - is listed in the Snapshot section of the chart and reviewed today.    Today patient counseled on age appropriate routine health concerns for screening and prevention, each reviewed and up to date or declined. Immunizations reviewed and up to date or declined. Labs ordered and reviewed. Risk factors for depression reviewed and negative. Hearing function and visual acuity are intact. ADLs screened and addressed as needed. Functional ability and level of safety reviewed and appropriate. Education, counseling and referrals performed based on assessed risks today. Patient provided with a copy of personalized plan for preventive services.   Colon 2016, due in 2021

## 2016-05-14 NOTE — Assessment & Plan Note (Signed)
Amlodipine.

## 2016-05-14 NOTE — Assessment & Plan Note (Signed)
F/u w/Dr Burr Medico

## 2016-05-14 NOTE — Progress Notes (Signed)
Pre-visit discussion using our clinic review tool. No additional management support is needed unless otherwise documented below in the visit note.  

## 2016-05-14 NOTE — Assessment & Plan Note (Signed)
On Levothroid 

## 2016-05-14 NOTE — Assessment & Plan Note (Signed)
Joe [name] died in 2016 Discussed grief

## 2016-06-17 ENCOUNTER — Telehealth: Payer: Self-pay | Admitting: Internal Medicine

## 2016-06-17 NOTE — Telephone Encounter (Signed)
Spoke with patient regarding awv. Patient stated that he is not interested in scheduling awv this year, but it he changes his mind he will give office a call.

## 2016-07-15 DIAGNOSIS — H59812 Chorioretinal scars after surgery for detachment, left eye: Secondary | ICD-10-CM | POA: Diagnosis not present

## 2016-09-21 DIAGNOSIS — H25011 Cortical age-related cataract, right eye: Secondary | ICD-10-CM | POA: Diagnosis not present

## 2016-09-21 DIAGNOSIS — Z961 Presence of intraocular lens: Secondary | ICD-10-CM | POA: Diagnosis not present

## 2016-09-21 DIAGNOSIS — H25041 Posterior subcapsular polar age-related cataract, right eye: Secondary | ICD-10-CM | POA: Diagnosis not present

## 2016-09-21 DIAGNOSIS — H2511 Age-related nuclear cataract, right eye: Secondary | ICD-10-CM | POA: Diagnosis not present

## 2016-09-28 NOTE — Progress Notes (Signed)
Joe [name]  [phone] Fax:([phone]) [phone]  Clinic follow up Note   Patient Care Team: Cassandria Anger, MD as PCP - General Truitt Merle, MD as Consulting Physician (Hematology) 09/30/2016  CHIEF COMPLAINTS:  Follow up CLL   HISTORY OF PRESENTING ILLNESS (07/18/2014):  Joe [name] 71 y.o. male is here because of lymphocytosis.   He had a normal CBC in 2011and 2012. He was noticed to have gradually increased WBC since 2012, from 12.1-16.3K, with differential showing increased absolute lymphocytes, from 5.4-9.6K (see below).   He has been feeling fatigued in the past few years, which he feels related to his sedentary life style. He has some muscular and joints stiffness in the morning, which gets better through day time and after exercise. He has chronic mild arthritis which is stable. No other complains,. No fever, chills or night sweats. He gained 20 lbs since he retired 2 years ago.   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   He is also the care giver of his wife, who has COPD and use oxygen at night.   CURRENT THERAPY: Observation   INTERIM HISTORY: He returns for follow up. He has been doing well. He has occasional fatigue. He is still working 3x a week. He admits to not getting enough exercise and he is noticing some right joint pain. He is still recovering from the death of his wife in January 16, 2015. He is looking forward to a cataract surgery in his right eye. He denies any recent infections. Otherwise, he has been doing well.     MEDICAL HISTORY:  Past Medical History:  Diagnosis Date  . Cataract   . 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 COPD (chronic obstructive pulmonary disease) ([facility])    sats 93-97 % per pt.  [name] GERD  (gastroesophageal reflux disease)   . Hyperlipidemia   . Hypertension   . Irregular heart beat    benign per pt.  . Paresis ([place])    right- s/p cerv decompression  . 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 Thyroid disease     SURGICAL HISTORY: Past Surgical History:  Procedure Laterality Date  . BICEPS TENDON REPAIR    . CATARACT EXTRACTION Left   . COLONOSCOPY  04-14-99   Dr Flossie Dibble polyp-TA in epic  . POLYPECTOMY  04-14-99  . POSTERIOR LAMINECTOMY / DECOMPRESSION CERVICAL SPINE     Dr Saintclair Halsted  . RETINAL DETACHMENT SURGERY     left eye, 2007 x2, 2008 x 3  . ROTATOR CUFF REPAIR  2004   right  . TONSILLECTOMY  [id number]    SOCIAL HISTORY: Social History   Social History  . Marital status: Widowed    Spouse name: N/A  . Number of children: N/A  . Years of education: N/A   Occupational History  . Not on file.   Social History Main Topics  . Smoking status: Former Smoker    Packs/day: 0.80    Years: 50.00    Types: Cigarettes    Quit date: 08/20/2012  . Smokeless tobacco: Never Used  . 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 Drug use: No  . Sexual activity: Yes   Other Topics Concern  . Not on file   Social History Narrative  . No narrative on file    FAMILY HISTORY: Family History  Problem Relation Age of Onset  . COPD Mother   . Diabetes Father   . Coronary artery disease Other   . Cancer Paternal Aunt 27       breast cancer   . Colon cancer Neg Hx     ALLERGIES:  has No Known Allergies.  MEDICATIONS:  Current Outpatient Prescriptions  Medication Sig Dispense Refill  . amLODipine (NORVASC) 5 MG tablet Take 1 tablet (5 mg total) by mouth daily. 90 tablet 3  . aspirin (BAYER ASPIRIN) 325 MG tablet Take 1 tablet (325 mg total) by mouth daily. 100 tablet 3  . Cholecalciferol (EQL VITAMIN D3) 1000 UNITS tablet Take 1,000 Units by mouth daily.      . diazepam  (VALIUM) 5 MG tablet Take 1 tablet (5 mg total) by mouth every 12 (twelve) hours. 60 tablet 3  . levothyroxine (SYNTHROID, LEVOTHROID) 50 MCG tablet Take 1 tablet (50 mcg total) by mouth daily. 90 tablet 2  . lovastatin (MEVACOR) 20 MG tablet TAKE 1 TABLET BY MOUTH AT BEDTIME 90 tablet 3  . Multiple Vitamin (MULTIVITAMIN) tablet Take 1 tablet by mouth daily. Centrum Silver.     Current Facility-Administered Medications  Medication Dose Route Frequency Provider Last Rate Last Dose  . pneumococcal 23 valent vaccine (PNU-IMMUNE) injection 0.5 mL  0.5 mL Intramuscular Tomorrow-1000 [name], [name], MD        REVIEW OF SYSTEMS:   Constitutional: Denies fevers, chills or abnormal night sweats Eyes: Denies blurriness of vision, double vision or watery eyes Ears, nose, mouth, throat, and face: Denies mucositis or sore throat Respiratory: Denies cough, dyspnea or wheezes Cardiovascular: Denies palpitation, chest discomfort or lower extremity swelling Gastrointestinal:  Denies nausea, heartburn or change in bowel habits Skin: Denies abnormal skin rashes Lymphatics: Denies new lymphadenopathy or easy bruising Neurological:Denies numbness, tingling or new weaknesses Behavioral/Psych: Mood is stable, no new changes  All other systems were reviewed with the patient and are negative.  PHYSICAL EXAMINATION:  ECOG PERFORMANCE STATUS: 1 - Symptomatic but completely ambulatory  Vitals:   09/30/16 1135  BP: (!) 153/77  Pulse: 61  Resp: 18  Temp: 97.9 F (36.6 C)   Filed Weights   09/30/16 1135  Weight: 194 lb 12.8 oz (88.4 kg)    GENERAL:alert, no distress and comfortable SKIN: skin color, texture, turgor are normal, no rashes or significant lesions EYES: normal, conjunctiva are pink and non-injected, sclera clear OROPHARYNX:no exudate, no erythema and lips, buccal mucosa, and tongue normal  NECK: supple, thyroid normal size, non-tender, without nodularity LYMPH:  no palpable  lymphadenopathy in the cervical, axillary or inguinal LUNGS: clear to auscultation and percussion with normal breathing effort HEART: regular rate & rhythm and no murmurs and no lower extremity edema ABDOMEN:abdomen soft, non-tender and normal bowel sounds Musculoskeletal:no cyanosis of digits and no clubbing  PSYCH: alert & oriented x 3 with fluent speech NEURO: no focal motor/sensory deficits  LABORATORY DATA:  I have reviewed the data as listed CBC Latest Ref Rng & Units 09/30/2016 04/01/2016 11/20/2015  WBC 4.0 - 10.3 10e3/uL 13.7(H) 15.3(H) 14.7(H)  Hemoglobin 13.0 - 17.1 g/dL 16.4 15.9 16.2  Hematocrit 38.4 - 49.9 % 50.3(H) 49.3 49.8  Platelets 140 - 400 10e3/uL 223 233 217  CMP Latest Ref Rng & Units 09/30/2016 05/14/2016 01/23/2015  Glucose 70 - 140 mg/dl 109 87 107  BUN 7.0 - 26.0 mg/dL 10.4 10 5.2(L)  Creatinine 0.7 - 1.3 mg/dL 0.8 0.78 0.8  Sodium 136 - 145 mEq/L 143 141 140  Potassium 3.5 - 5.1 mEq/L 4.3 4.9 4.2  Chloride 96 - 112 mEq/L - 102 -  CO2 22 - 29 mEq/L 27 34(H) 27  Calcium 8.4 - 10.4 mg/dL 9.6 9.8 9.5  Total Protein 6.4 - 8.3 g/dL 7.1 7.1 6.9  Total Bilirubin 0.20 - 1.20 mg/dL 1.26(H) 1.4(H) 0.55  Alkaline Phos 40 - 150 U/L 84 70 84  AST 5 - 34 U/L 35(H) 34 30  ALT 0 - 55 U/L 49 42 41    Interpretation Peripheral Blood Flow Cytometry - FINDINGS CONSISTENT WITH CHRONIC LYMPHOCYTIC LEUKEMIA. Susanne Greenhouse MD Pathologist, Electronic Signature  RADIOGRAPHIC STUDIES: I have personally reviewed the radiological images as listed and agreed with the findings in the report. No results found.  ASSESSMENT & PLAN:  71 y.o. Caucasian male, with past medical history of hypertension, cervical spine stenosis status post surgery, who was found to have gradually increased WBC with predominant lymphocytosis since 2012.  1. CLL, stage 0 -I reviewed his CBC and differential in the past few years with patient and his wife in details. -Giving his significantly increased  lymphocytosis at 9K, no significant increase of other white cell, this is likely CLL. I reviewed his flow cytometry result, which confirms the diagnosis of CLL. -He has no lymphadenopathy, liver and spleen size are normal, no anemia or thrombocytopenia, he has stage 0 disease. He is clinically doing very well. -We previously discussed the nature history of CLL, and indications for treatment, and treatment options.  - he is clinically doing well. His ALC has been stable over time, no anemia or thrombocytopenia, clinical exam shows no adenopathy, no concerns for disease progression. Will continue monitoring.  2. He will continue follow-up with his primary care physician for HTN and other medical issues.  Follow-up, CBC with differential in 5 months, f/u in 11 months  All questions were answered. The patient knows to call the clinic with any problems, questions or concerns.  I spent 15 minutes counseling the patient face to face. The total time spent in the appointment was 20 minutes and more than 50% was on counseling.  This document serves as a record of services personally performed by Truitt Merle, MD. It was created on her behalf by Brandt Loosen, a trained medical scribe. The creation of this record is based on the scribe's personal observations and the provider's statements to them. This document has been checked and approved by the attending provider.    Truitt Merle, MD 09/30/2016 12:09 PM

## 2016-09-30 ENCOUNTER — Other Ambulatory Visit (HOSPITAL_BASED_OUTPATIENT_CLINIC_OR_DEPARTMENT_OTHER): Payer: Medicare Other

## 2016-09-30 ENCOUNTER — Ambulatory Visit (HOSPITAL_BASED_OUTPATIENT_CLINIC_OR_DEPARTMENT_OTHER): Payer: Medicare Other | Admitting: Hematology

## 2016-09-30 ENCOUNTER — Telehealth: Payer: Self-pay | Admitting: Hematology

## 2016-09-30 VITALS — BP 153/77 | HR 61 | Temp 97.9°F | Resp 18 | Ht 71.0 in | Wt 194.8 lb

## 2016-09-30 DIAGNOSIS — C9110 Chronic lymphocytic leukemia of B-cell type not having achieved remission: Secondary | ICD-10-CM

## 2016-09-30 LAB — COMPREHENSIVE METABOLIC PANEL
ALT: 49 U/L (ref 0–55)
ANION GAP: 11 meq/L (ref 3–11)
AST: 35 U/L — ABNORMAL HIGH (ref 5–34)
Albumin: 4 g/dL (ref 3.5–5.0)
Alkaline Phosphatase: 84 U/L (ref 40–150)
BILIRUBIN TOTAL: 1.26 mg/dL — AB (ref 0.20–1.20)
BUN: 10.4 mg/dL (ref 7.0–26.0)
CHLORIDE: 105 meq/L (ref 98–109)
CO2: 27 meq/L (ref 22–29)
Calcium: 9.6 mg/dL (ref 8.4–10.4)
Creatinine: 0.8 mg/dL (ref 0.7–1.3)
GLUCOSE: 109 mg/dL (ref 70–140)
POTASSIUM: 4.3 meq/L (ref 3.5–5.1)
SODIUM: 143 meq/L (ref 136–145)
TOTAL PROTEIN: 7.1 g/dL (ref 6.4–8.3)

## 2016-09-30 LAB — CBC & DIFF AND RETIC
BASO%: 0.2 % (ref 0.0–2.0)
Basophils Absolute: 0 10*3/uL (ref 0.0–0.1)
EOS%: 1.5 % (ref 0.0–7.0)
Eosinophils Absolute: 0.2 10*3/uL (ref 0.0–0.5)
HCT: 50.3 % — ABNORMAL HIGH (ref 38.4–49.9)
HGB: 16.4 g/dL (ref 13.0–17.1)
IMMATURE RETIC FRACT: 6 % (ref 3.00–10.60)
LYMPH#: 6.8 10*3/uL — AB (ref 0.9–3.3)
LYMPH%: 49.8 % — AB (ref 14.0–49.0)
MCH: 31.5 pg (ref 27.2–33.4)
MCHC: 32.6 g/dL (ref 32.0–36.0)
MCV: 96.5 fL (ref 79.3–98.0)
MONO#: 0.9 10*3/uL (ref 0.1–0.9)
MONO%: 6.5 % (ref 0.0–14.0)
NEUT%: 42 % (ref 39.0–75.0)
NEUTROS ABS: 5.8 10*3/uL (ref 1.5–6.5)
Platelets: 223 10*3/uL (ref 140–400)
RBC: 5.21 10*6/uL (ref 4.20–5.82)
RDW: 13.2 % (ref 11.0–14.6)
Retic %: 1.43 % (ref 0.80–1.80)
Retic Ct Abs: 74.5 10*3/uL (ref 34.80–93.90)
WBC: 13.7 10*3/uL — AB (ref 4.0–10.3)

## 2016-09-30 LAB — TECHNOLOGIST REVIEW

## 2016-09-30 NOTE — Telephone Encounter (Signed)
Gv pt appts for Dec 2018 + June 2019.

## 2016-10-03 ENCOUNTER — Encounter: Payer: Self-pay | Admitting: Hematology

## 2016-10-29 ENCOUNTER — Encounter (INDEPENDENT_AMBULATORY_CARE_PROVIDER_SITE_OTHER): Payer: Medicare Other | Admitting: Ophthalmology

## 2016-10-29 DIAGNOSIS — H34831 Tributary (branch) retinal vein occlusion, right eye, with macular edema: Secondary | ICD-10-CM | POA: Diagnosis not present

## 2016-10-29 DIAGNOSIS — H43811 Vitreous degeneration, right eye: Secondary | ICD-10-CM

## 2016-10-29 DIAGNOSIS — I10 Essential (primary) hypertension: Secondary | ICD-10-CM | POA: Diagnosis not present

## 2016-10-29 DIAGNOSIS — H33301 Unspecified retinal break, right eye: Secondary | ICD-10-CM

## 2016-10-29 DIAGNOSIS — H35372 Puckering of macula, left eye: Secondary | ICD-10-CM

## 2016-10-29 DIAGNOSIS — H35033 Hypertensive retinopathy, bilateral: Secondary | ICD-10-CM

## 2016-10-29 DIAGNOSIS — H2511 Age-related nuclear cataract, right eye: Secondary | ICD-10-CM | POA: Diagnosis not present

## 2016-11-11 ENCOUNTER — Encounter: Payer: Self-pay | Admitting: Internal Medicine

## 2016-11-11 ENCOUNTER — Ambulatory Visit (INDEPENDENT_AMBULATORY_CARE_PROVIDER_SITE_OTHER): Payer: Medicare Other | Admitting: Internal Medicine

## 2016-11-11 DIAGNOSIS — C9190 Lymphoid leukemia, unspecified not having achieved remission: Secondary | ICD-10-CM

## 2016-11-11 DIAGNOSIS — R5381 Other malaise: Secondary | ICD-10-CM | POA: Diagnosis not present

## 2016-11-11 DIAGNOSIS — I10 Essential (primary) hypertension: Secondary | ICD-10-CM | POA: Diagnosis not present

## 2016-11-11 DIAGNOSIS — E039 Hypothyroidism, unspecified: Secondary | ICD-10-CM

## 2016-11-11 DIAGNOSIS — M2550 Pain in unspecified joint: Secondary | ICD-10-CM | POA: Diagnosis not present

## 2016-11-11 DIAGNOSIS — F4321 Adjustment disorder with depressed mood: Secondary | ICD-10-CM

## 2016-11-11 DIAGNOSIS — F4320 Adjustment disorder, unspecified: Secondary | ICD-10-CM | POA: Diagnosis not present

## 2016-11-11 DIAGNOSIS — C9110 Chronic lymphocytic leukemia of B-cell type not having achieved remission: Secondary | ICD-10-CM

## 2016-11-11 MED ORDER — AMLODIPINE BESYLATE 5 MG PO TABS: 5.0000 mg | ORAL_TABLET | Freq: Every day | ORAL | 3 refills | Status: DC

## 2016-11-11 MED ORDER — LEVOTHYROXINE SODIUM 50 MCG PO TABS: 50.0000 ug | ORAL_TABLET | Freq: Every day | ORAL | 3 refills | Status: DC

## 2016-11-11 MED ORDER — DIAZEPAM 5 MG PO TABS: 5.0000 mg | ORAL_TABLET | Freq: Two times a day (BID) | ORAL | 3 refills | Status: DC

## 2016-11-11 MED ORDER — LOVASTATIN 20 MG PO TABS: 20.0000 mg | ORAL_TABLET | Freq: Every day | ORAL | 3 refills | Status: DC

## 2016-11-11 NOTE — Assessment & Plan Note (Signed)
Stable  [employer]

## 2016-11-11 NOTE — Assessment & Plan Note (Signed)
Levothyroxine

## 2016-11-11 NOTE — Assessment & Plan Note (Signed)
Amlodipine.

## 2016-11-11 NOTE — Progress Notes (Signed)
Subjective:  Patient ID: Joe [name], male    DOB: 1945/09/14  Age: 71 y.o. MRN: [id number]  CC: No chief complaint on file.   HPI Joe [name] presents for HTN, hypothyroidism, dyslipidemia f/u. Declined all shots  Outpatient Medications Prior to Visit  Medication Sig Dispense Refill  . amLODipine (NORVASC) 5 MG tablet Take 1 tablet (5 mg total) by mouth daily. 90 tablet 3  . aspirin (BAYER ASPIRIN) 325 MG tablet Take 1 tablet (325 mg total) by mouth daily. 100 tablet 3  . Cholecalciferol (EQL VITAMIN D3) 1000 UNITS tablet Take 1,000 Units by mouth daily.      . diazepam (VALIUM) 5 MG tablet Take 1 tablet (5 mg total) by mouth every 12 (twelve) hours. 60 tablet 3  . levothyroxine (SYNTHROID, LEVOTHROID) 50 MCG tablet Take 1 tablet (50 mcg total) by mouth daily. 90 tablet 2  . lovastatin (MEVACOR) 20 MG tablet TAKE 1 TABLET BY MOUTH AT BEDTIME 90 tablet 3  . Multiple Vitamin (MULTIVITAMIN) tablet Take 1 tablet by mouth daily. Centrum Silver.     Facility-Administered Medications Prior to Visit  Medication Dose Route Frequency Provider Last Rate Last Dose  . pneumococcal 23 valent vaccine (PNU-IMMUNE) injection 0.5 mL  0.5 mL Intramuscular Tomorrow-1000 Ishi Danser V, MD        ROS Review of Systems  Constitutional: Negative for appetite change, fatigue and unexpected weight change.  HENT: Negative for congestion, nosebleeds, sneezing, sore throat and trouble swallowing.   Eyes: Negative for itching and visual disturbance.  Respiratory: Negative for cough.   Cardiovascular: Negative for chest pain, palpitations and leg swelling.  Gastrointestinal: Negative for abdominal distention, blood in stool, diarrhea and nausea.  Genitourinary: Negative for frequency and hematuria.  Musculoskeletal: Positive for arthralgias and gait problem. Negative for back pain, joint swelling and neck pain.  Skin: Negative for rash.  Neurological: Negative for dizziness, tremors, speech  difficulty and weakness.  Psychiatric/Behavioral: Negative for agitation, dysphoric mood and sleep disturbance. The patient is not nervous/anxious.     Objective:  BP 132/80 (BP Location: Left Arm, Patient Position: Sitting, Cuff Size: Large)   Pulse 66   Temp 98.7 F (37.1 C) (Oral)   Ht 5\' 11"  (1.803 m)   Wt 196 lb (88.9 kg)   SpO2 97%   BMI 27.34 kg/m   BP Readings from Last 3 Encounters:  11/11/16 132/80  09/30/16 (!) 153/77  05/14/16 (!) 150/88    Wt Readings from Last 3 Encounters:  11/11/16 196 lb (88.9 kg)  09/30/16 194 lb 12.8 oz (88.4 kg)  05/14/16 196 lb 8 oz (89.1 kg)    Physical Exam  Constitutional: He is oriented to person, place, and time. He appears well-developed. No distress.  NAD  HENT:  Mouth/Throat: Oropharynx is clear and moist.  Eyes: Pupils are equal, round, and reactive to light. Conjunctivae are normal.  Neck: Normal range of motion. No JVD present. No thyromegaly present.  Cardiovascular: Normal rate, regular rhythm, normal heart sounds and intact distal pulses.  Exam reveals no gallop and no friction rub.   No murmur heard. Pulmonary/Chest: Effort normal and breath sounds normal. No respiratory distress. He has no wheezes. He has no rales. He exhibits no tenderness.  Abdominal: Soft. Bowel sounds are normal. He exhibits no distension and no mass. There is no tenderness. There is no rebound and no guarding.  Musculoskeletal: Normal range of motion. He exhibits no edema or tenderness.  Lymphadenopathy:    He has  no cervical adenopathy.  Neurological: He is alert and oriented to person, place, and time. He has normal reflexes. No cranial nerve deficit. He exhibits normal muscle tone. He displays a negative Romberg sign. Coordination abnormal. Gait normal.  Skin: Skin is warm and dry. No rash noted.  Psychiatric: He has a normal mood and affect. His behavior is normal. Judgment and thought content normal.  Cane LS tender Ataxic  Lab Results    Component Value Date   WBC 13.7 (H) 09/30/2016   HGB 16.4 09/30/2016   HCT 50.3 (H) 09/30/2016   PLT 223 09/30/2016   GLUCOSE 109 09/30/2016   CHOL 189 05/14/2016   TRIG 213.0 (H) 05/14/2016   HDL 48.80 05/14/2016   LDLDIRECT 108.0 05/14/2016   LDLCALC 104 (H) 06/25/2014   ALT 49 09/30/2016   AST 35 (H) 09/30/2016   NA 143 09/30/2016   K 4.3 09/30/2016   CL 102 05/14/2016   CREATININE 0.8 09/30/2016   BUN 10.4 09/30/2016   CO2 27 09/30/2016   TSH 2.42 05/14/2016   PSA 0.47 05/14/2016    US Abdomen Complete  Result Date: 07/25/2014 CLINICAL DATA:  Elevated lymphocytes level ; assess status of the liver and spleen. EXAM: ULTRASOUND ABDOMEN COMPLETE COMPARISON:  None. FINDINGS: Gallbladder: The gallbladder is adequately distended. There are multiple echogenic mobile shadowing stones. There is no gallbladder wall thickening or pericholecystic fluid or positive sonographic Murphy's sign. Common bile duct: Diameter: 4.4 mm Liver: The hepatic echotexture is mildly increased. There is no focal mass or ductal dilation. IVC: No abnormality visualized. Pancreas: Visualized portion unremarkable. Spleen: Spleen is normal in size and echotexture. Right Kidney: Length: 12.0. Echogenicity within normal limits. No mass or hydronephrosis visualized. Left Kidney: Length: 12.6 Cm. Echogenicity within normal limits. There is a parapelvic cyst. No hydronephrosis visualized. Abdominal aorta: No aneurysm visualized. Other findings: There is no ascites. IMPRESSION: 1. Gallstones without evidence of acute cholecystitis. 2. Increased hepatic echotexture is consistent with fatty infiltration. The S hepatic and splenic sizes are normal. 3. The kidneys are normal. Electronically Signed   By: David  Martinique M.D.   On: 07/25/2014 13:40    Assessment & Plan:   There are no diagnoses linked to this encounter. I am having Joe [name] maintain his Cholecalciferol, aspirin, multivitamin, lovastatin, amLODipine,  levothyroxine, and diazepam. We will continue to administer pneumococcal 23 valent vaccine.  No orders of the defined types were placed in this encounter.    Follow-up: No Follow-up on file.  Walker Kehr, MD

## 2016-11-11 NOTE — Assessment & Plan Note (Signed)
Discussed - better

## 2016-11-11 NOTE — Assessment & Plan Note (Signed)
Good nutrition discussed

## 2016-11-11 NOTE — Patient Instructions (Signed)
MC well w/Jill 

## 2016-11-11 NOTE — Assessment & Plan Note (Signed)
Last CBC was ok

## 2016-11-29 DIAGNOSIS — H2511 Age-related nuclear cataract, right eye: Secondary | ICD-10-CM | POA: Diagnosis not present

## 2016-12-23 ENCOUNTER — Ambulatory Visit (INDEPENDENT_AMBULATORY_CARE_PROVIDER_SITE_OTHER): Payer: Medicare Other

## 2016-12-23 DIAGNOSIS — Z23 Encounter for immunization: Secondary | ICD-10-CM | POA: Diagnosis not present

## 2016-12-30 ENCOUNTER — Encounter (INDEPENDENT_AMBULATORY_CARE_PROVIDER_SITE_OTHER): Payer: Medicare Other | Admitting: Ophthalmology

## 2016-12-30 DIAGNOSIS — H33301 Unspecified retinal break, right eye: Secondary | ICD-10-CM | POA: Diagnosis not present

## 2016-12-30 DIAGNOSIS — H35372 Puckering of macula, left eye: Secondary | ICD-10-CM

## 2016-12-30 DIAGNOSIS — H43811 Vitreous degeneration, right eye: Secondary | ICD-10-CM | POA: Diagnosis not present

## 2016-12-30 DIAGNOSIS — H35033 Hypertensive retinopathy, bilateral: Secondary | ICD-10-CM

## 2016-12-30 DIAGNOSIS — H348312 Tributary (branch) retinal vein occlusion, right eye, stable: Secondary | ICD-10-CM | POA: Diagnosis not present

## 2016-12-30 DIAGNOSIS — H338 Other retinal detachments: Secondary | ICD-10-CM

## 2016-12-30 DIAGNOSIS — I10 Essential (primary) hypertension: Secondary | ICD-10-CM

## 2017-03-03 ENCOUNTER — Other Ambulatory Visit: Payer: Medicare Other

## 2017-03-10 ENCOUNTER — Other Ambulatory Visit (HOSPITAL_BASED_OUTPATIENT_CLINIC_OR_DEPARTMENT_OTHER): Payer: Medicare Other

## 2017-03-10 DIAGNOSIS — C9110 Chronic lymphocytic leukemia of B-cell type not having achieved remission: Secondary | ICD-10-CM

## 2017-03-10 LAB — CBC & DIFF AND RETIC
BASO%: 0.3 % (ref 0.0–2.0)
Basophils Absolute: 0.1 10*3/uL (ref 0.0–0.1)
EOS%: 1.5 % (ref 0.0–7.0)
Eosinophils Absolute: 0.3 10*3/uL (ref 0.0–0.5)
HCT: 52.5 % — ABNORMAL HIGH (ref 38.4–49.9)
HGB: 17.2 g/dL — ABNORMAL HIGH (ref 13.0–17.1)
IMMATURE RETIC FRACT: 4.2 % (ref 3.00–10.60)
LYMPH#: 9.8 10*3/uL — AB (ref 0.9–3.3)
LYMPH%: 59 % — ABNORMAL HIGH (ref 14.0–49.0)
MCH: 31.7 pg (ref 27.2–33.4)
MCHC: 32.8 g/dL (ref 32.0–36.0)
MCV: 96.7 fL (ref 79.3–98.0)
MONO#: 1 10*3/uL — AB (ref 0.1–0.9)
MONO%: 6.2 % (ref 0.0–14.0)
NEUT%: 33 % — AB (ref 39.0–75.0)
NEUTROS ABS: 5.5 10*3/uL (ref 1.5–6.5)
PLATELETS: 241 10*3/uL (ref 140–400)
RBC: 5.43 10*6/uL (ref 4.20–5.82)
RDW: 13.6 % (ref 11.0–14.6)
RETIC CT ABS: 74.39 10*3/uL (ref 34.80–93.90)
Retic %: 1.37 % (ref 0.80–1.80)
WBC: 16.7 10*3/uL — AB (ref 4.0–10.3)

## 2017-03-10 LAB — COMPREHENSIVE METABOLIC PANEL
ALBUMIN: 4.1 g/dL (ref 3.5–5.0)
ALK PHOS: 76 U/L (ref 40–150)
ALT: 45 U/L (ref 0–55)
ANION GAP: 10 meq/L (ref 3–11)
AST: 34 U/L (ref 5–34)
BILIRUBIN TOTAL: 1.41 mg/dL — AB (ref 0.20–1.20)
BUN: 10.7 mg/dL (ref 7.0–26.0)
CALCIUM: 9.4 mg/dL (ref 8.4–10.4)
CO2: 30 mEq/L — ABNORMAL HIGH (ref 22–29)
Chloride: 103 mEq/L (ref 98–109)
Creatinine: 0.8 mg/dL (ref 0.7–1.3)
Glucose: 90 mg/dl (ref 70–140)
POTASSIUM: 4.7 meq/L (ref 3.5–5.1)
SODIUM: 143 meq/L (ref 136–145)
TOTAL PROTEIN: 7.6 g/dL (ref 6.4–8.3)

## 2017-03-10 LAB — TECHNOLOGIST REVIEW

## 2017-03-16 ENCOUNTER — Telehealth: Payer: Self-pay | Admitting: *Deleted

## 2017-03-16 NOTE — Telephone Encounter (Signed)
Left message for pt to return call to discuss labs.

## 2017-03-17 ENCOUNTER — Telehealth: Payer: Self-pay | Admitting: Hematology

## 2017-03-17 NOTE — Telephone Encounter (Signed)
Pt returned call today & informed of elevated WBC per DR Feng's message & need to be seen with lab sooner.  Pt expressed understanding & schedule message sent to move appt to 3 mo from now.

## 2017-03-17 NOTE — Telephone Encounter (Signed)
-----   Message from Truitt Merle, MD sent at 03/11/2017  9:11 AM EST ----- Please let pt know the lab result. His WBC is higher than last time, please move his lab and f/u to 3 months from now, thanks.   Truitt Merle  03/11/2017

## 2017-03-17 NOTE — Telephone Encounter (Signed)
Spoke to patient regarding upcoming March 2019 appointments  Per 12/27 sch message.

## 2017-05-19 ENCOUNTER — Encounter: Payer: Self-pay | Admitting: Internal Medicine

## 2017-05-19 ENCOUNTER — Ambulatory Visit (INDEPENDENT_AMBULATORY_CARE_PROVIDER_SITE_OTHER): Payer: Medicare Other | Admitting: Internal Medicine

## 2017-05-19 VITALS — BP 128/82 | HR 65 | Temp 97.9°F | Ht 71.0 in | Wt 199.0 lb

## 2017-05-19 DIAGNOSIS — I10 Essential (primary) hypertension: Secondary | ICD-10-CM | POA: Diagnosis not present

## 2017-05-19 DIAGNOSIS — C9110 Chronic lymphocytic leukemia of B-cell type not having achieved remission: Secondary | ICD-10-CM

## 2017-05-19 DIAGNOSIS — C9190 Lymphoid leukemia, unspecified not having achieved remission: Secondary | ICD-10-CM

## 2017-05-19 DIAGNOSIS — J449 Chronic obstructive pulmonary disease, unspecified: Secondary | ICD-10-CM

## 2017-05-19 DIAGNOSIS — N320 Bladder-neck obstruction: Secondary | ICD-10-CM | POA: Diagnosis not present

## 2017-05-19 DIAGNOSIS — E785 Hyperlipidemia, unspecified: Secondary | ICD-10-CM

## 2017-05-19 DIAGNOSIS — F411 Generalized anxiety disorder: Secondary | ICD-10-CM

## 2017-05-19 MED ORDER — ZOSTER VAC RECOMB ADJUVANTED 50 MCG/0.5ML IM SUSR: 0.5000 mL | Freq: Once | INTRAMUSCULAR | 1 refills | Status: AC

## 2017-05-19 NOTE — Assessment & Plan Note (Signed)
F/u w/Dr Burr Medico

## 2017-05-19 NOTE — Patient Instructions (Signed)
MC well w/Jill 

## 2017-05-19 NOTE — Progress Notes (Signed)
Subjective:  Patient ID: Joe [name], male    DOB: 1945/09/08  Age: 72 y.o. MRN: [id number]  CC: No chief complaint on file.   HPI Joe [name] presents for HTN, LBP, gait disorder. C/o runny nose in Jan 2019 F/u CLL  Outpatient Medications Prior to Visit  Medication Sig Dispense Refill  . amLODipine (NORVASC) 5 MG tablet Take 1 tablet (5 mg total) by mouth daily. 90 tablet 3  . aspirin (BAYER ASPIRIN) 325 MG tablet Take 1 tablet (325 mg total) by mouth daily. 100 tablet 3  . Cholecalciferol (EQL VITAMIN D3) 1000 UNITS tablet Take 1,000 Units by mouth daily.      . diazepam (VALIUM) 5 MG tablet Take 1 tablet (5 mg total) by mouth every 12 (twelve) hours. 60 tablet 3  . levothyroxine (SYNTHROID, LEVOTHROID) 50 MCG tablet Take 1 tablet (50 mcg total) by mouth daily. 90 tablet 3  . lovastatin (MEVACOR) 20 MG tablet Take 1 tablet (20 mg total) by mouth at bedtime. 90 tablet 3  . Multiple Vitamin (MULTIVITAMIN) tablet Take 1 tablet by mouth daily. Centrum Silver.     Facility-Administered Medications Prior to Visit  Medication Dose Route Frequency Provider Last Rate Last Dose  . pneumococcal 23 valent vaccine (PNU-IMMUNE) injection 0.5 mL  0.5 mL Intramuscular Tomorrow-1000 [name], [name], MD        ROS Review of Systems  Constitutional: Positive for fatigue. Negative for appetite change and unexpected weight change.  HENT: Negative for congestion, nosebleeds, sneezing, sore throat and trouble swallowing.   Eyes: Negative for itching and visual disturbance.  Respiratory: Negative for cough.   Cardiovascular: Negative for chest pain, palpitations and leg swelling.  Gastrointestinal: Negative for abdominal distention, blood in stool, diarrhea and nausea.  Genitourinary: Negative for frequency and hematuria.  Musculoskeletal: Positive for arthralgias, back pain and gait problem. Negative for joint swelling and neck pain.  Skin: Negative for rash.  Neurological: Negative  for dizziness, tremors, speech difficulty and weakness.  Psychiatric/Behavioral: Negative for agitation, dysphoric mood and sleep disturbance. The patient is not nervous/anxious.     Objective:  BP 128/82 (BP Location: Left Arm, Patient Position: Sitting, Cuff Size: Large)   Pulse 65   Temp 97.9 F (36.6 C) (Oral)   Ht 5\' 11"  (1.803 m)   Wt 199 lb (90.3 kg)   SpO2 95%   BMI 27.75 kg/m   BP Readings from Last 3 Encounters:  05/19/17 128/82  11/11/16 132/80  09/30/16 (!) 153/77    Wt Readings from Last 3 Encounters:  05/19/17 199 lb (90.3 kg)  11/11/16 196 lb (88.9 kg)  09/30/16 194 lb 12.8 oz (88.4 kg)    Physical Exam  Constitutional: He is oriented to person, place, and time. He appears well-developed. No distress.  NAD  HENT:  Mouth/Throat: Oropharynx is clear and moist.  Eyes: Conjunctivae are normal. Pupils are equal, round, and reactive to light.  Neck: Normal range of motion. No JVD present. No thyromegaly present.  Cardiovascular: Normal rate, regular rhythm, normal heart sounds and intact distal pulses. Exam reveals no gallop and no friction rub.  No murmur heard. Pulmonary/Chest: Effort normal and breath sounds normal. No respiratory distress. He has no wheezes. He has no rales. He exhibits no tenderness.  Abdominal: Soft. Bowel sounds are normal. He exhibits no distension and no mass. There is no tenderness. There is no rebound and no guarding.  Musculoskeletal: Normal range of motion. He exhibits no edema or tenderness.  Lymphadenopathy:  He has no cervical adenopathy.  Neurological: He is alert and oriented to person, place, and time. He has normal reflexes. No cranial nerve deficit. He exhibits normal muscle tone. He displays a negative Romberg sign. Coordination abnormal. Gait normal.  Skin: Skin is warm and dry. No rash noted.  Psychiatric: He has a normal mood and affect. His behavior is normal. Judgment and thought content normal.  cane Foot drop  Lab  Results  Component Value Date   WBC 16.7 (H) 03/10/2017   HGB 17.2 (H) 03/10/2017   HCT 52.5 (H) 03/10/2017   PLT 241 03/10/2017   GLUCOSE 90 03/10/2017   CHOL 189 05/14/2016   TRIG 213.0 (H) 05/14/2016   HDL 48.80 05/14/2016   LDLDIRECT 108.0 05/14/2016   LDLCALC 104 (H) 06/25/2014   ALT 45 03/10/2017   AST 34 03/10/2017   NA 143 03/10/2017   K 4.7 03/10/2017   CL 102 05/14/2016   CREATININE 0.8 03/10/2017   BUN 10.7 03/10/2017   CO2 30 (H) 03/10/2017   TSH 2.42 05/14/2016   PSA 0.47 05/14/2016    US Abdomen Complete  Result Date: 07/25/2014 CLINICAL DATA:  Elevated lymphocytes level ; assess status of the liver and spleen. EXAM: ULTRASOUND ABDOMEN COMPLETE COMPARISON:  None. FINDINGS: Gallbladder: The gallbladder is adequately distended. There are multiple echogenic mobile shadowing stones. There is no gallbladder wall thickening or pericholecystic fluid or positive sonographic Murphy's sign. Common bile duct: Diameter: 4.4 mm Liver: The hepatic echotexture is mildly increased. There is no focal mass or ductal dilation. IVC: No abnormality visualized. Pancreas: Visualized portion unremarkable. Spleen: Spleen is normal in size and echotexture. Right Kidney: Length: 12.0. Echogenicity within normal limits. No mass or hydronephrosis visualized. Left Kidney: Length: 12.6 Cm. Echogenicity within normal limits. There is a parapelvic cyst. No hydronephrosis visualized. Abdominal aorta: No aneurysm visualized. Other findings: There is no ascites. IMPRESSION: 1. Gallstones without evidence of acute cholecystitis. 2. Increased hepatic echotexture is consistent with fatty infiltration. The S hepatic and splenic sizes are normal. 3. The kidneys are normal. Electronically Signed   By: David  Martinique M.D.   On: 07/25/2014 13:40    Assessment & Plan:   There are no diagnoses linked to this encounter. I am having Kiara C. Arizola maintain his Cholecalciferol, aspirin, multivitamin, diazepam,  levothyroxine, amLODipine, and lovastatin. We will continue to administer pneumococcal 23 valent vaccine.  No orders of the defined types were placed in this encounter.    Follow-up: No Follow-up on file.  Walker Kehr, MD

## 2017-05-19 NOTE — Assessment & Plan Note (Signed)
Not smoking.  

## 2017-05-19 NOTE — Addendum Note (Signed)
Addended by: Karren Cobble on: 05/19/2017 10:21 AM   Modules accepted: Orders

## 2017-05-19 NOTE — Assessment & Plan Note (Signed)
Amlodipine.

## 2017-05-19 NOTE — Assessment & Plan Note (Signed)
Chronic  Potential benefits of a long term steroid  use as well as potential risks  and complications were explained to the patient and were aknowledged.   

## 2017-05-31 ENCOUNTER — Encounter: Payer: Self-pay | Admitting: Internal Medicine

## 2017-05-31 ENCOUNTER — Ambulatory Visit (INDEPENDENT_AMBULATORY_CARE_PROVIDER_SITE_OTHER): Payer: Medicare Other | Admitting: Internal Medicine

## 2017-05-31 DIAGNOSIS — M25511 Pain in right shoulder: Secondary | ICD-10-CM | POA: Diagnosis not present

## 2017-05-31 DIAGNOSIS — M25512 Pain in left shoulder: Secondary | ICD-10-CM | POA: Insufficient documentation

## 2017-05-31 DIAGNOSIS — R519 Headache, unspecified: Secondary | ICD-10-CM | POA: Insufficient documentation

## 2017-05-31 DIAGNOSIS — C9110 Chronic lymphocytic leukemia of B-cell type not having achieved remission: Secondary | ICD-10-CM

## 2017-05-31 DIAGNOSIS — M25519 Pain in unspecified shoulder: Secondary | ICD-10-CM | POA: Insufficient documentation

## 2017-05-31 DIAGNOSIS — M79642 Pain in left hand: Secondary | ICD-10-CM | POA: Diagnosis not present

## 2017-05-31 DIAGNOSIS — C9190 Lymphoid leukemia, unspecified not having achieved remission: Secondary | ICD-10-CM

## 2017-05-31 DIAGNOSIS — R51 Headache: Secondary | ICD-10-CM

## 2017-05-31 NOTE — Patient Instructions (Signed)
It is okay to use the valium for pain if needed the next 2 weeks.   This should feel better within 4-6 weeks, if it is worsening call us back.

## 2017-05-31 NOTE — Assessment & Plan Note (Signed)
Bruising on the hand, examination reveals no likely fracture and no indication for imaging. Ice to the area TID for the next several days to speed resolution of bruising. Tylenol for pain.
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
## 2017-05-31 NOTE — Assessment & Plan Note (Signed)
Stable recently and we talked about how he may have more extensive bruising and slower to recover than average.

## 2017-05-31 NOTE — Assessment & Plan Note (Signed)
No indication of facial bone fracture. Ice for bruising and tylenol for pain.

## 2017-05-31 NOTE — Assessment & Plan Note (Signed)
Tylenol or valium for the muscular pain. Examination of the cervical and thoracic and lumbar spine reveals no indication of fracture and no indication for imaging of the spine today. Continue with icing and pain medications as above. Return or call for worsening, numbness, radiating pain, pain in back or neck.
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
## 2017-06-16 ENCOUNTER — Inpatient Hospital Stay (HOSPITAL_BASED_OUTPATIENT_CLINIC_OR_DEPARTMENT_OTHER): Payer: Medicare Other | Admitting: Hematology

## 2017-06-16 ENCOUNTER — Encounter: Payer: Self-pay | Admitting: Hematology

## 2017-06-16 ENCOUNTER — Inpatient Hospital Stay: Payer: Medicare Other | Attending: Hematology

## 2017-06-16 ENCOUNTER — Telehealth: Payer: Self-pay | Admitting: Hematology

## 2017-06-16 VITALS — BP 138/79 | HR 77 | Temp 98.0°F | Resp 20 | Ht 71.0 in | Wt 198.1 lb

## 2017-06-16 DIAGNOSIS — R531 Weakness: Secondary | ICD-10-CM

## 2017-06-16 DIAGNOSIS — F1011 Alcohol abuse, in remission: Secondary | ICD-10-CM | POA: Diagnosis not present

## 2017-06-16 DIAGNOSIS — E785 Hyperlipidemia, unspecified: Secondary | ICD-10-CM

## 2017-06-16 DIAGNOSIS — Z9981 Dependence on supplemental oxygen: Secondary | ICD-10-CM | POA: Insufficient documentation

## 2017-06-16 DIAGNOSIS — K219 Gastro-esophageal reflux disease without esophagitis: Secondary | ICD-10-CM

## 2017-06-16 DIAGNOSIS — J449 Chronic obstructive pulmonary disease, unspecified: Secondary | ICD-10-CM | POA: Insufficient documentation

## 2017-06-16 DIAGNOSIS — Z79899 Other long term (current) drug therapy: Secondary | ICD-10-CM | POA: Diagnosis not present

## 2017-06-16 DIAGNOSIS — F329 Major depressive disorder, single episode, unspecified: Secondary | ICD-10-CM | POA: Insufficient documentation

## 2017-06-16 DIAGNOSIS — C9110 Chronic lymphocytic leukemia of B-cell type not having achieved remission: Secondary | ICD-10-CM

## 2017-06-16 DIAGNOSIS — Z87891 Personal history of nicotine dependence: Secondary | ICD-10-CM

## 2017-06-16 DIAGNOSIS — E079 Disorder of thyroid, unspecified: Secondary | ICD-10-CM

## 2017-06-16 DIAGNOSIS — Z803 Family history of malignant neoplasm of breast: Secondary | ICD-10-CM | POA: Insufficient documentation

## 2017-06-16 DIAGNOSIS — I10 Essential (primary) hypertension: Secondary | ICD-10-CM

## 2017-06-16 DIAGNOSIS — G8389 Other specified paralytic syndromes: Secondary | ICD-10-CM

## 2017-06-16 DIAGNOSIS — Z7982 Long term (current) use of aspirin: Secondary | ICD-10-CM

## 2017-06-16 DIAGNOSIS — R5383 Other fatigue: Secondary | ICD-10-CM | POA: Diagnosis not present

## 2017-06-16 LAB — CBC WITH DIFFERENTIAL (CANCER CENTER ONLY)
BASOS ABS: 0.1 10*3/uL (ref 0.0–0.1)
BASOS PCT: 0 %
EOS PCT: 1 %
Eosinophils Absolute: 0.1 10*3/uL (ref 0.0–0.5)
HEMATOCRIT: 51.2 % — AB (ref 38.4–49.9)
Hemoglobin: 16.9 g/dL (ref 13.0–17.1)
Lymphocytes Relative: 48 %
Lymphs Abs: 7.3 10*3/uL — ABNORMAL HIGH (ref 0.9–3.3)
MCH: 31.3 pg (ref 27.2–33.4)
MCHC: 33.1 g/dL (ref 32.0–36.0)
MCV: 94.5 fL (ref 79.3–98.0)
MONO ABS: 1 10*3/uL — AB (ref 0.1–0.9)
Monocytes Relative: 7 %
NEUTROS ABS: 6.5 10*3/uL (ref 1.5–6.5)
Neutrophils Relative %: 44 %
PLATELETS: 275 10*3/uL (ref 140–400)
RBC: 5.42 MIL/uL (ref 4.20–5.82)
RDW: 14.2 % (ref 11.0–14.6)
WBC Count: 15 10*3/uL — ABNORMAL HIGH (ref 4.0–10.3)

## 2017-06-16 LAB — COMPREHENSIVE METABOLIC PANEL
ALBUMIN: 4.1 g/dL (ref 3.5–5.0)
ALT: 66 U/L — ABNORMAL HIGH (ref 0–55)
ANION GAP: 7 (ref 3–11)
AST: 42 U/L — ABNORMAL HIGH (ref 5–34)
Alkaline Phosphatase: 95 U/L (ref 40–150)
BILIRUBIN TOTAL: 1.2 mg/dL (ref 0.2–1.2)
BUN: 10 mg/dL (ref 7–26)
CO2: 31 mmol/L — ABNORMAL HIGH (ref 22–29)
Calcium: 9.5 mg/dL (ref 8.4–10.4)
Chloride: 105 mmol/L (ref 98–109)
Creatinine, Ser: 0.81 mg/dL (ref 0.70–1.30)
GFR calc Af Amer: >60 mL/min (ref >60)
GLUCOSE: 100 mg/dL (ref 70–140)
Potassium: 4.7 mmol/L (ref 3.5–5.1)
Sodium: 143 mmol/L (ref 136–145)
TOTAL PROTEIN: 7.4 g/dL (ref 6.4–8.3)

## 2017-06-16 LAB — RETICULOCYTES
RBC.: 5.35 MIL/uL (ref 4.22–5.81)
RETIC COUNT ABSOLUTE: 74.9 10*3/uL (ref 19.0–186.0)
Retic Ct Pct: 1.4 % (ref 0.4–3.1)

## 2017-06-16 NOTE — Telephone Encounter (Signed)
Scheduled appt per 3/28 los- Gave patient AVS and calender per los.

## 2017-07-28 ENCOUNTER — Other Ambulatory Visit: Payer: Self-pay | Admitting: Internal Medicine

## 2017-09-01 ENCOUNTER — Inpatient Hospital Stay: Payer: Medicare Other | Admitting: Hematology

## 2017-09-01 ENCOUNTER — Inpatient Hospital Stay: Payer: Medicare Other

## 2017-11-07 ENCOUNTER — Other Ambulatory Visit (INDEPENDENT_AMBULATORY_CARE_PROVIDER_SITE_OTHER): Payer: Medicare Other

## 2017-11-07 ENCOUNTER — Other Ambulatory Visit: Payer: Self-pay

## 2017-11-07 DIAGNOSIS — I10 Essential (primary) hypertension: Secondary | ICD-10-CM | POA: Diagnosis not present

## 2017-11-07 DIAGNOSIS — N320 Bladder-neck obstruction: Secondary | ICD-10-CM | POA: Diagnosis not present

## 2017-11-07 DIAGNOSIS — E785 Hyperlipidemia, unspecified: Secondary | ICD-10-CM | POA: Diagnosis not present

## 2017-11-07 LAB — CBC WITH DIFFERENTIAL/PLATELET
BASOS ABS: 0 10*3/uL (ref 0.0–0.1)
Basophils Relative: 0.3 % (ref 0.0–3.0)
Eosinophils Absolute: 0.2 10*3/uL (ref 0.0–0.7)
Eosinophils Relative: 1.6 % (ref 0.0–5.0)
HCT: 50.7 % (ref 39.0–52.0)
Hemoglobin: 16.7 g/dL (ref 13.0–17.0)
LYMPHS ABS: 7.7 10*3/uL — AB (ref 0.7–4.0)
Lymphocytes Relative: 52.2 % — ABNORMAL HIGH (ref 12.0–46.0)
MCHC: 32.8 g/dL (ref 30.0–36.0)
MCV: 94.3 fl (ref 78.0–100.0)
MONO ABS: 1.2 10*3/uL — AB (ref 0.1–1.0)
Monocytes Relative: 7.9 % (ref 3.0–12.0)
NEUTROS PCT: 38 % — AB (ref 43.0–77.0)
Neutro Abs: 5.6 10*3/uL (ref 1.4–7.7)
Platelets: 272 10*3/uL (ref 150.0–400.0)
RBC: 5.38 Mil/uL (ref 4.22–5.81)
RDW: 14.1 % (ref 11.5–15.5)
WBC: 14.7 10*3/uL — ABNORMAL HIGH (ref 4.0–10.5)

## 2017-11-07 LAB — URINALYSIS
BILIRUBIN URINE: NEGATIVE
HGB URINE DIPSTICK: NEGATIVE
Ketones, ur: NEGATIVE
Leukocytes, UA: NEGATIVE
Nitrite: NEGATIVE
Specific Gravity, Urine: 1.01 (ref 1.000–1.030)
Total Protein, Urine: NEGATIVE
URINE GLUCOSE: NEGATIVE
Urobilinogen, UA: 0.2 (ref 0.0–1.0)
pH: 6 (ref 5.0–8.0)

## 2017-11-07 LAB — HEPATIC FUNCTION PANEL
ALBUMIN: 4.3 g/dL (ref 3.5–5.2)
ALK PHOS: 73 U/L (ref 39–117)
ALT: 37 U/L (ref 0–53)
AST: 31 U/L (ref 0–37)
BILIRUBIN DIRECT: 0.2 mg/dL (ref 0.0–0.3)
Total Bilirubin: 1.4 mg/dL — ABNORMAL HIGH (ref 0.2–1.2)
Total Protein: 6.8 g/dL (ref 6.0–8.3)

## 2017-11-07 LAB — LIPID PANEL
CHOL/HDL RATIO: 4
CHOLESTEROL: 152 mg/dL (ref 0–200)
HDL: 41.1 mg/dL (ref >39.00)
LDL CALC: 80 mg/dL (ref 0–99)
NonHDL: 110.65
Triglycerides: 152 mg/dL — ABNORMAL HIGH (ref 0.0–149.0)
VLDL: 30.4 mg/dL (ref 0.0–40.0)

## 2017-11-07 LAB — BASIC METABOLIC PANEL
BUN: 9 mg/dL (ref 6–23)
CALCIUM: 9.6 mg/dL (ref 8.4–10.5)
CO2: 30 mEq/L (ref 19–32)
CREATININE: 0.78 mg/dL (ref 0.40–1.50)
Chloride: 105 mEq/L (ref 96–112)
GFR: 104.09 mL/min (ref >60.00)
Glucose, Bld: 107 mg/dL — ABNORMAL HIGH (ref 70–99)
Potassium: 4.3 mEq/L (ref 3.5–5.1)
Sodium: 142 mEq/L (ref 135–145)

## 2017-11-07 LAB — PSA: PSA: 0.17 ng/mL (ref 0.10–4.00)

## 2017-11-07 LAB — TSH: TSH: 3.69 u[IU]/mL (ref 0.35–4.50)

## 2017-11-14 ENCOUNTER — Other Ambulatory Visit: Payer: Self-pay | Admitting: Internal Medicine

## 2017-11-16 ENCOUNTER — Encounter: Payer: Self-pay | Admitting: Internal Medicine

## 2017-11-16 ENCOUNTER — Ambulatory Visit (INDEPENDENT_AMBULATORY_CARE_PROVIDER_SITE_OTHER): Payer: Medicare Other | Admitting: *Deleted

## 2017-11-16 ENCOUNTER — Ambulatory Visit (INDEPENDENT_AMBULATORY_CARE_PROVIDER_SITE_OTHER): Payer: Medicare Other | Admitting: Internal Medicine

## 2017-11-16 VITALS — BP 131/72 | HR 64 | Temp 97.8°F | Resp 18 | Ht 71.0 in | Wt 194.0 lb

## 2017-11-16 DIAGNOSIS — J449 Chronic obstructive pulmonary disease, unspecified: Secondary | ICD-10-CM | POA: Diagnosis not present

## 2017-11-16 DIAGNOSIS — F411 Generalized anxiety disorder: Secondary | ICD-10-CM | POA: Diagnosis not present

## 2017-11-16 DIAGNOSIS — I10 Essential (primary) hypertension: Secondary | ICD-10-CM

## 2017-11-16 DIAGNOSIS — K8020 Calculus of gallbladder without cholecystitis without obstruction: Secondary | ICD-10-CM

## 2017-11-16 DIAGNOSIS — Z0000 Encounter for general adult medical examination without abnormal findings: Secondary | ICD-10-CM | POA: Diagnosis not present

## 2017-11-16 DIAGNOSIS — C9190 Lymphoid leukemia, unspecified not having achieved remission: Secondary | ICD-10-CM | POA: Diagnosis not present

## 2017-11-16 DIAGNOSIS — C9110 Chronic lymphocytic leukemia of B-cell type not having achieved remission: Secondary | ICD-10-CM

## 2017-11-16 HISTORY — DX: Calculus of gallbladder without cholecystitis without obstruction: K80.20

## 2017-11-16 MED ORDER — DIAZEPAM 5 MG PO TABS: 5.0000 mg | ORAL_TABLET | Freq: Two times a day (BID) | ORAL | 1 refills | Status: DC

## 2017-11-16 MED ORDER — LEVOTHYROXINE SODIUM 50 MCG PO TABS: 50.0000 ug | ORAL_TABLET | Freq: Every day | ORAL | 3 refills | Status: DC

## 2017-11-16 MED ORDER — LOVASTATIN 20 MG PO TABS: 20.0000 mg | ORAL_TABLET | Freq: Every day | ORAL | 3 refills | Status: DC

## 2017-11-16 MED ORDER — AMLODIPINE BESYLATE 5 MG PO TABS: 5.0000 mg | ORAL_TABLET | Freq: Every day | ORAL | 3 refills | Status: DC

## 2017-11-16 NOTE — Assessment & Plan Note (Signed)
Not smoking since 2014
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
## 2017-11-16 NOTE — Progress Notes (Addendum)
Subjective:   Joe [name] is a 72 y.o. male who presents for Medicare Annual/Subsequent preventive examination.  Review of Systems:  No ROS.  Medicare Wellness Visit. Additional risk factors are reflected in the social history.  Cardiac Risk Factors include: advanced age (>76men, >29 women);dyslipidemia;hypertension;male gender Sleep patterns: gets up 2-3 times nightly to void and sleeps 7 hours nightly.    Home Safety/Smoke Alarms: Feels safe in home. Smoke alarms in place.  Living environment; residence and Firearm Safety: 1-story house/ trailer, equipment: Radio producer, Type: [place], no firearms Lives alone, no needs for DME, good support system. Seat Belt Safety/Bike Helmet: Wears seat belt.   PSA-  Lab Results  Component Value Date   PSA 0.17 11/07/2017   PSA 0.47 05/14/2016   PSA 0.33 06/25/2014       Objective:    Vitals: BP 131/72   Pulse 64   Temp 97.8 F (36.6 C)   Resp 18   Ht 5\' 11"  (1.803 m)   Wt 194 lb (88 kg)   SpO2 97%   BMI 27.06 kg/m   Body mass index is 27.06 kg/m.  Advanced Directives 11/16/2017 06/16/2017 09/30/2016 04/01/2016 07/24/2015 06/26/2015 08/29/2014  Does Patient Have a Medical Advance Directive? Yes No No No No (No Data) No  Type of Paramedic of [place];Living will - - - - - -  Copy of [place] in Chart? No - copy requested - - - - - -  Would patient like information on creating a medical advance directive? - - - No - Patient declined No - patient declined information - -    Tobacco Social History   Tobacco Use  Smoking Status Former Smoker  . Packs/day: 0.80  . Years: 50.00  . Pack years: 40.00  . Types: Cigarettes  . Last attempt to quit: 08/20/2012  . Years since quitting: 5.2  Smokeless Tobacco Never Used     Counseling given: Not Answered  Past Medical History:  Diagnosis Date  . Cataract   . 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  COPD (chronic obstructive pulmonary disease) ([facility])    sats 93-97 % per pt.  [name] GERD (gastroesophageal reflux disease)   . Hyperlipidemia   . Hypertension   . Irregular heart beat    benign per pt.  . 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 Thyroid disease    Past Surgical History:  Procedure Laterality Date  . BICEPS TENDON REPAIR    . CATARACT EXTRACTION Left   . COLONOSCOPY  04-14-99   Dr Flossie Dibble polyp-TA in epic  . POLYPECTOMY  04-14-99  . POSTERIOR LAMINECTOMY / DECOMPRESSION CERVICAL SPINE     Dr Saintclair Halsted  . RETINAL DETACHMENT SURGERY     left eye, 2007 x2, 2008 x 3  . ROTATOR CUFF REPAIR  2004   right  . TONSILLECTOMY  [id number]   Family History  Problem Relation Age of Onset  . COPD Mother   . Diabetes Father   . Coronary artery disease Other   . Cancer Paternal Aunt 55       breast cancer   . Colon cancer Neg Hx    Social History   Socioeconomic History  . Marital status: Widowed    Spouse name: Not on file  . Number of children: 1  . Years of education: Not on file  .  Highest education level: Not on file  Occupational History  . Occupation: retired  Scientific laboratory technician  . Financial resource strain: Not hard at all  . Food insecurity:    Worry: Never true    Inability: Never true  . Transportation needs:    Medical: No    Non-medical: No  Tobacco Use  . Smoking status: Former Smoker    Packs/day: 0.80    Years: 50.00    Pack years: 40.00    Types: Cigarettes    Last attempt to quit: 08/20/2012    Years since quitting: 5.2  . Smokeless tobacco: Never Used  Substance and Sexual Activity  . 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 Drug use: No  . Sexual activity: Not Currently  Lifestyle  . Physical activity:    Days per week: 3 days    Minutes per session: 40 min  .  Stress: Not at all  Relationships  . Social connections:    Talks on phone: More than three times a week    Gets together: More than three times a week    Attends religious service: 1 to 4 times per year    Active member of club or organization: Not on file    Attends meetings of clubs or organizations: More than 4 times per year    Relationship status: Widowed  Other Topics Concern  . Not on file  Social History Narrative  . Not on file    Outpatient Encounter Medications as of 11/16/2017  Medication Sig  . aspirin (BAYER ASPIRIN) 325 MG tablet Take 1 tablet (325 mg total) by mouth daily.  . Cholecalciferol (EQL VITAMIN D3) 1000 UNITS tablet Take 1,000 Units by mouth daily.    . Multiple Vitamin (MULTIVITAMIN) tablet Take 1 tablet by mouth daily. Centrum Silver.  . [DISCONTINUED] amLODipine (NORVASC) 5 MG tablet TAKE 1 TABLET BY MOUTH EVERY DAY  . [DISCONTINUED] diazepam (VALIUM) 5 MG tablet TAKE 1 TABLET BY MOUTH EVERY 12 HOURS  . [DISCONTINUED] levothyroxine (SYNTHROID, LEVOTHROID) 50 MCG tablet TAKE 1 TABLET BY MOUTH EVERY DAY  . [DISCONTINUED] lovastatin (MEVACOR) 20 MG tablet Take 1 tablet (20 mg total) by mouth at bedtime.   No facility-administered encounter medications on file as of 11/16/2017.     Activities of Daily Living In your present state of health, do you have any difficulty performing the following activities: 11/16/2017  Hearing? N  Vision? N  Difficulty concentrating or making decisions? N  Walking or climbing stairs? N  Dressing or bathing? N  Doing errands, shopping? N  Preparing Food and eating ? N  Using the Toilet? N  In the past six months, have you accidently leaked urine? N  Do you have problems with loss of bowel control? N  Managing your Medications? N  Managing your Finances? N  Housekeeping or managing your Housekeeping? N  Some recent data might be hidden    Patient Care Team: [name], [name], MD as PCP - General Truitt Merle, MD as  Consulting Physician (Hematology)   Assessment:   This is a routine wellness examination for Marquan. Physical assessment deferred to PCP.   Exercise Activities and Dietary recommendations Current Exercise Habits: The patient does not participate in regular exercise at present, Exercise limited by: None identified  Diet (  meal preparation, eat out, water intake, caffeinated beverages, dairy products, fruits and vegetables): in general, a "healthy" diet     Reviewed heart healthy diet. Encouraged patient to increase daily water and healthy fluid intake.   Goals    . Exercise 150 minutes per week (moderate activity)     Will start doing stretches every day at some point  Will spend time outdoors; likes to walk;  [place]; Trails for walking; 4 mile/ set small goals on small trails and build endurance     . Patient Stated     I want to get motivated and get my house clean and become more active in [employer].       Fall Risk Fall Risk  11/16/2017 11/11/2016 06/26/2015 06/25/2014  Falls in the past year? No No No No    Depression Screen PHQ 2/9 Scores 11/16/2017 11/11/2016 06/25/2014  PHQ - 2 Score 1 0 0    Cognitive Function MMSE - Mini Mental State Exam 06/26/2015  Not completed: (No Data)       Ad8 score reviewed for issues:  Issues making decisions: no  Less interest in hobbies / activities: no  Repeats questions, stories (family complaining): no  Trouble using ordinary gadgets (microwave, computer, phone):no  Forgets the month or year: no  Mismanaging finances: no  Remembering appts: no  Daily problems with thinking and/or memory: no Ad8 score is= 0    Immunization History  Administered Date(s) Administered  . Influenza Split 02/02/2012  . Influenza Whole 12/23/2005  . Influenza, High Dose Seasonal PF 12/11/2015, 12/23/2016  . Influenza,inj,Quad PF,6+ Mos 12/13/2012, 12/13/2013, 12/25/2014  . Pneumococcal Conjugate-13 03/12/2013  . Pneumococcal  Polysaccharide-23 06/12/2013  . Td 05/14/2009   Screening Tests Health Maintenance  Topic Date Due  . INFLUENZA VACCINE  10/20/2017  . TETANUS/TDAP  05/15/2019  . COLONOSCOPY  09/12/2019  . Hepatitis C Screening  Completed  . PNA vac Low Risk Adult  Completed        Plan:     Continue doing brain stimulating activities (puzzles, reading, adult coloring books, staying active) to keep memory sharp.   Continue to eat heart healthy diet (full of fruits, vegetables, whole grains, lean protein, water--limit salt, fat, and sugar intake) and increase physical activity as tolerated.  I have personally reviewed and noted the following in the patient's chart:   . Medical and social history . Use of alcohol, tobacco or illicit drugs  . Current medications and supplements . Functional ability and status . Nutritional status . Physical activity . Advanced directives . List of other physicians . Vitals . Screenings to include cognitive, depression, and falls . Referrals and appointments  In addition, I have reviewed and discussed with patient certain preventive protocols, quality metrics, and best practice recommendations. A written personalized care plan for preventive services as well as general preventive health recommendations were provided to patient.     Michiel Cowboy, RN  11/16/2017  Medical screening examination/treatment/procedure(s) were performed by non-physician practitioner and as supervising physician I was immediately available for consultation/collaboration. I agree with above. Lew Dawes, MD

## 2017-11-16 NOTE — Assessment & Plan Note (Signed)
Chronic  Potential benefits of a long term steroid  use as well as potential risks  and complications were explained to the patient and were aknowledged.   

## 2017-11-16 NOTE — Assessment & Plan Note (Signed)
F/u w/Dr Burr Medico

## 2017-11-16 NOTE — Patient Instructions (Addendum)
Continue doing brain stimulating activities (puzzles, reading, adult coloring books, staying active) to keep memory sharp.   Continue to eat heart healthy diet (full of fruits, vegetables, whole grains, lean protein, water--limit salt, fat, and sugar intake) and increase physical activity as tolerated.   Joe [name] , Thank you for taking time to come for your Medicare Wellness Visit. I appreciate your ongoing commitment to your health goals. Please review the following plan we discussed and let me know if I can assist you in the future.   These are the goals we discussed: Goals    . Exercise 150 minutes per week (moderate activity)     Will start doing stretches every day at some point  Will spend time outdoors; likes to walk;  [place]; Trails for walking; 4 mile/ set small goals on small trails and build endurance     . Patient Stated     I want to get motivated and get my house clean and become more active in [employer].       This is a list of the screening recommended for you and due dates:  Health Maintenance  Topic Date Due  . Flu Shot  10/20/2017  . Tetanus Vaccine  05/15/2019  . Colon Cancer Screening  09/12/2019  .  Hepatitis C: One time screening is recommended by Center for Disease Control  (CDC) for  adults born from [birth year] through 1965.   Completed  . Pneumonia vaccines  Completed     Health Maintenance, Male A healthy lifestyle and preventive care is important for your health and wellness. Ask your health care provider about what schedule of regular examinations is right for you. What should I know about weight and diet? Eat a Healthy Diet  Eat plenty of vegetables, fruits, whole grains, low-fat dairy products, and lean protein.  Do not eat a lot of foods high in solid fats, added sugars, or salt.  Maintain a Healthy Weight Regular exercise can help you achieve or maintain a healthy weight. You should:  Do at least 150 minutes of exercise each  week. The exercise should increase your heart rate and make you sweat (moderate-intensity exercise).  Do strength-training exercises at least twice a week.  Watch Your Levels of Cholesterol and Blood Lipids  Have your blood tested for lipids and cholesterol every 5 years starting at 72 years of age. If you are at high risk for heart disease, you should start having your blood tested when you are 72 years old. You may need to have your cholesterol levels checked more often if: ? Your lipid or cholesterol levels are high. ? You are older than 72 years of age. ? You are at high risk for heart disease.  What should I know about cancer screening? Many types of cancers can be detected early and may often be prevented. Lung Cancer  You should be screened every year for lung cancer if: ? You are a current smoker who has smoked for at least 30 years. ? You are a former smoker who has quit within the past 15 years.  Talk to your health care provider about your screening options, when you should start screening, and how often you should be screened.  Colorectal Cancer  Routine colorectal cancer screening usually begins at 72 years of age and should be repeated every 5-10 years until you are 72 years old. You may need to be screened more often if early forms of precancerous polyps or small  growths are found. Your health care provider may recommend screening at an earlier age if you have risk factors for colon cancer.  Your health care provider may recommend using home test kits to check for hidden blood in the stool.  A small camera at the end of a tube can be used to examine your colon (sigmoidoscopy or colonoscopy). This checks for the earliest forms of colorectal cancer.  Prostate and Testicular Cancer  Depending on your age and overall health, your health care provider may do certain tests to screen for prostate and testicular cancer.  Talk to your health care provider about any symptoms or  concerns you have about testicular or prostate cancer.  Skin Cancer  Check your skin from head to toe regularly.  Tell your health care provider about any new moles or changes in moles, especially if: ? There is a change in a mole's size, shape, or color. ? You have a mole that is larger than a pencil eraser.  Always use sunscreen. Apply sunscreen liberally and repeat throughout the day.  Protect yourself by wearing long sleeves, pants, a wide-brimmed hat, and sunglasses when outside.  What should I know about heart disease, diabetes, and high blood pressure?  If you are 52-62 years of age, have your blood pressure checked every 3-5 years. If you are 65 years of age or older, have your blood pressure checked every year. You should have your blood pressure measured twice-once when you are at a hospital or clinic, and once when you are not at a hospital or clinic. Record the average of the two measurements. To check your blood pressure when you are not at a hospital or clinic, you can use: ? An automated blood pressure machine at a pharmacy. ? A home blood pressure monitor.  Talk to your health care provider about your target blood pressure.  If you are between 24-59 years old, ask your health care provider if you should take aspirin to prevent heart disease.  Have regular diabetes screenings by checking your fasting blood sugar level. ? If you are at a normal weight and have a low risk for diabetes, have this test once every three years after the age of 84. ? If you are overweight and have a high risk for diabetes, consider being tested at a younger age or more often.  A one-time screening for abdominal aortic aneurysm (AAA) by ultrasound is recommended for men aged 34-75 years who are current or former smokers. What should I know about preventing infection? Hepatitis B If you have a higher risk for hepatitis B, you should be screened for this virus. Talk with your health care provider  to find out if you are at risk for hepatitis B infection. Hepatitis C Blood testing is recommended for:  Everyone born from [birth year] through 1965.  Anyone with known risk factors for hepatitis C.  Sexually Transmitted Diseases (STDs)  You should be screened each year for STDs including gonorrhea and chlamydia if: ? You are sexually active and are younger than 72 years of age. ? You are older than 72 years of age and your health care provider tells you that you are at risk for this type of infection. ? Your sexual activity has changed since you were last screened and you are at an increased risk for chlamydia or gonorrhea. Ask your health care provider if you are at risk.  Talk with your health care provider about whether you are at high risk of being  infected with HIV. Your health care provider may recommend a prescription medicine to help prevent HIV infection.  What else can I do?  Schedule regular health, dental, and eye exams.  Stay current with your vaccines (immunizations).  Do not use any tobacco products, such as cigarettes, chewing tobacco, and e-cigarettes. If you need help quitting, ask your health care provider.  Limit alcohol intake to no more than 2 drinks per day. One drink equals 12 ounces of beer, 5 ounces of Dayan Desa, or 1 ounces of hard liquor.  Do not use street drugs.  Do not share needles.  Ask your health care provider for help if you need support or information about quitting drugs.  Tell your health care provider if you often feel depressed.  Tell your health care provider if you have ever been abused or do not feel safe at home. This information is not intended to replace advice given to you by your health care provider. Make sure you discuss any questions you have with your health care provider. Document Released: 09/04/2007 Document Revised: 11/05/2015 Document Reviewed: 12/10/2014 Elsevier Interactive Patient Education  [employer].

## 2017-11-16 NOTE — Patient Instructions (Addendum)
MC well w/Jill 1 year

## 2017-11-16 NOTE — Assessment & Plan Note (Signed)
Asymptomatic Pt refused surg ref

## 2017-11-16 NOTE — Assessment & Plan Note (Signed)
Amlodipine.

## 2017-11-18 ENCOUNTER — Ambulatory Visit: Payer: Medicare Other | Admitting: Internal Medicine

## 2017-12-29 ENCOUNTER — Ambulatory Visit (INDEPENDENT_AMBULATORY_CARE_PROVIDER_SITE_OTHER): Payer: Medicare Other | Admitting: *Deleted

## 2017-12-29 DIAGNOSIS — Z23 Encounter for immunization: Secondary | ICD-10-CM

## 2018-01-05 ENCOUNTER — Encounter (INDEPENDENT_AMBULATORY_CARE_PROVIDER_SITE_OTHER): Payer: Medicare Other | Admitting: Ophthalmology

## 2018-01-05 DIAGNOSIS — H353121 Nonexudative age-related macular degeneration, left eye, early dry stage: Secondary | ICD-10-CM | POA: Diagnosis not present

## 2018-01-05 DIAGNOSIS — H33301 Unspecified retinal break, right eye: Secondary | ICD-10-CM

## 2018-01-05 DIAGNOSIS — H34831 Tributary (branch) retinal vein occlusion, right eye, with macular edema: Secondary | ICD-10-CM

## 2018-01-05 DIAGNOSIS — H338 Other retinal detachments: Secondary | ICD-10-CM

## 2018-01-05 DIAGNOSIS — I10 Essential (primary) hypertension: Secondary | ICD-10-CM | POA: Diagnosis not present

## 2018-01-05 DIAGNOSIS — H35033 Hypertensive retinopathy, bilateral: Secondary | ICD-10-CM

## 2018-01-05 DIAGNOSIS — H43811 Vitreous degeneration, right eye: Secondary | ICD-10-CM | POA: Diagnosis not present

## 2018-01-05 DIAGNOSIS — H35372 Puckering of macula, left eye: Secondary | ICD-10-CM

## 2018-01-16 ENCOUNTER — Telehealth: Payer: Self-pay

## 2018-01-16 ENCOUNTER — Telehealth: Payer: Self-pay | Admitting: Hematology

## 2018-01-16 NOTE — Telephone Encounter (Signed)
Scheduled appt per 10/28 sch message - pt is aware of appt date and time.

## 2018-01-16 NOTE — Telephone Encounter (Signed)
Patient calls requesting his appointments in November be moved out, had recent blood work done at [facility], having no symptoms of CLL, patient would like to come in January 2020, sent scheduling message.  Instructed patient to call if he develops any symptoms and needs to be seen earlier.  He verbalized an understanding.

## 2018-01-26 ENCOUNTER — Other Ambulatory Visit: Payer: Medicare Other

## 2018-01-26 ENCOUNTER — Ambulatory Visit: Payer: Medicare Other | Admitting: Hematology

## 2018-02-13 ENCOUNTER — Other Ambulatory Visit: Payer: Self-pay | Admitting: Internal Medicine

## 2018-03-07 ENCOUNTER — Encounter (INDEPENDENT_AMBULATORY_CARE_PROVIDER_SITE_OTHER): Payer: Medicare Other | Admitting: Ophthalmology

## 2018-03-07 DIAGNOSIS — H35033 Hypertensive retinopathy, bilateral: Secondary | ICD-10-CM | POA: Diagnosis not present

## 2018-03-07 DIAGNOSIS — H59031 Cystoid macular edema following cataract surgery, right eye: Secondary | ICD-10-CM | POA: Diagnosis not present

## 2018-03-07 DIAGNOSIS — H34831 Tributary (branch) retinal vein occlusion, right eye, with macular edema: Secondary | ICD-10-CM | POA: Diagnosis not present

## 2018-03-07 DIAGNOSIS — I10 Essential (primary) hypertension: Secondary | ICD-10-CM | POA: Diagnosis not present

## 2018-03-07 DIAGNOSIS — H33301 Unspecified retinal break, right eye: Secondary | ICD-10-CM

## 2018-03-07 DIAGNOSIS — H43811 Vitreous degeneration, right eye: Secondary | ICD-10-CM | POA: Diagnosis not present

## 2018-03-07 DIAGNOSIS — H338 Other retinal detachments: Secondary | ICD-10-CM

## 2018-03-27 NOTE — Progress Notes (Signed)
[facility]   [phone] [phone]   Clinic Follow up Note   Patient Care Team: [name], [name], MD as PCP - General Truitt Merle, MD as Consulting Physician (Hematology) 03/30/2018  CHIEF COMPLAINT: F/u on CLL   CURRENT THERAPY Observation   INTERVAL HISTORY: Joe [name] is a 73 y.o. male who is here for follow-up. Today, he is here alone. He is doing well. He states that he is still accommodating to his wife's death and is not as physically active as he would like to be. He doesn't tolerate hot weather well, but denies fever or night sweats.   Pertinent positives and negatives of review of systems are listed and detailed within the above HPI.  REVIEW OF SYSTEMS:   Constitutional: Denies fevers, chills or abnormal weight loss Eyes: Denies blurriness of vision Ears, nose, mouth, throat, and face: Denies mucositis or sore throat Respiratory: Denies cough, dyspnea or wheezes Cardiovascular: Denies palpitation, chest discomfort or lower extremity swelling Gastrointestinal:  Denies nausea, heartburn or change in bowel habits Skin: Denies abnormal skin rashes Lymphatics: Denies new lymphadenopathy or easy bruising Neurological:Denies numbness, tingling or new weaknesses Behavioral/Psych: Mood is stable, no new changes  All other systems were reviewed with the patient and are negative.  MEDICAL HISTORY:  Past Medical History:  Diagnosis Date  . Cataract   . 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 COPD (chronic obstructive pulmonary disease) (HCC)    sats 93-97 % per pt.  [name] GERD (gastroesophageal reflux disease)   . Hyperlipidemia   . Hypertension   . Irregular heart beat    benign per pt.  . 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 Thyroid disease     SURGICAL HISTORY: Past Surgical History:  Procedure Laterality Date  . BICEPS TENDON REPAIR    .  CATARACT EXTRACTION Left   . COLONOSCOPY  04-14-99   Dr Flossie Dibble polyp-TA in epic  . POLYPECTOMY  04-14-99  . POSTERIOR LAMINECTOMY / DECOMPRESSION CERVICAL SPINE     Dr Saintclair Halsted  . RETINAL DETACHMENT SURGERY     left eye, 2007 x2, 2008 x 3  . ROTATOR CUFF REPAIR  2004   right  . TONSILLECTOMY  [id number]    I have reviewed the social history and family history with the patient and they are unchanged from previous note.  ALLERGIES:  has No Known Allergies.  MEDICATIONS:  Current Outpatient Medications  Medication Sig Dispense Refill  . amLODipine (NORVASC) 5 MG tablet Take 1 tablet (5 mg total) by mouth daily. 90 tablet 3  . aspirin (BAYER ASPIRIN) 325 MG tablet Take 1 tablet (325 mg total) by mouth daily. 100 tablet 3  . Cholecalciferol (EQL VITAMIN D3) 1000 UNITS tablet Take 1,000 Units by mouth daily.      . diazepam (VALIUM) 5 MG tablet Take 1 tablet (5 mg total) by mouth every 12 (twelve) hours. 180 tablet 1  . levothyroxine (SYNTHROID, LEVOTHROID) 50 MCG tablet Take 1 tablet (50 mcg total) by mouth daily. 90 tablet 3  . levothyroxine (SYNTHROID, LEVOTHROID) 50 MCG tablet TAKE 1 TABLET BY MOUTH EVERY DAY 90 tablet 1  . lovastatin (MEVACOR) 20 MG tablet Take 1 tablet (20 mg total) by mouth at bedtime. 90 tablet 3  . Multiple Vitamin (MULTIVITAMIN) tablet Take 1 tablet by mouth daily. Centrum Silver.    . prednisoLONE acetate (PRED  FORTE) 1 % ophthalmic suspension 1 drop 2 (two) times daily.     No current facility-administered medications for this visit.     PHYSICAL EXAMINATION: ECOG PERFORMANCE STATUS: 1 - Symptomatic but completely ambulatory  Vitals:   03/30/18 0955  BP: (!) 141/78  Pulse: 72  Resp: 16  Temp: 98 F (36.7 C)  SpO2: 92%   Filed Weights   03/30/18 0955  Weight: 200 lb 8 oz (90.9 kg)    GENERAL:alert, no distress and comfortable SKIN: skin color, texture, turgor are normal, no rashes or significant lesions EYES: normal, Conjunctiva are pink and  non-injected, sclera clear OROPHARYNX:no exudate, no erythema and lips, buccal mucosa, and tongue normal  NECK: supple, thyroid normal size, non-tender, without nodularity LYMPH:  no palpable lymphadenopathy in the cervical, axillary or inguinal LUNGS: clear to auscultation and percussion with normal breathing effort HEART: regular rate & rhythm and no murmurs and no lower extremity edema ABDOMEN:abdomen soft, non-tender and normal bowel sounds Musculoskeletal:no cyanosis of digits and no clubbing  NEURO: alert & oriented x 3 with fluent speech, no focal motor/sensory deficits  LABORATORY DATA:  I have reviewed the data as listed CBC Latest Ref Rng & Units 03/30/2018 11/07/2017 06/16/2017  WBC 4.0 - 10.5 K/uL 14.4(H) 14.7(H) 15.0(H)  Hemoglobin 13.0 - 17.0 g/dL 17.5(H) 16.7 16.9  Hematocrit 39.0 - 52.0 % 55.2(H) 50.7 51.2(H)  Platelets 150 - 400 K/uL 256 272.0 275     CMP Latest Ref Rng & Units 03/30/2018 11/07/2017 06/16/2017  Glucose 70 - 99 mg/dL 101(H) 107(H) 100  BUN 8 - 23 mg/dL 10 9 10   Creatinine 0.61 - 1.24 mg/dL 0.81 0.78 0.81  Sodium 135 - 145 mmol/L 143 142 143  Potassium 3.5 - 5.1 mmol/L 5.3(H) 4.3 4.7  Chloride 98 - 111 mmol/L 102 105 105  CO2 22 - 32 mmol/L 32 30 31(H)  Calcium 8.9 - 10.3 mg/dL 9.7 9.6 9.5  Total Protein 6.5 - 8.1 g/dL 7.6 6.8 7.4  Total Bilirubin 0.3 - 1.2 mg/dL 1.4(H) 1.4(H) 1.2  Alkaline Phos 38 - 126 U/L 79 73 95  AST 15 - 41 U/L 31 31 42(H)  ALT 0 - 44 U/L 38 37 66(H)      RADIOGRAPHIC STUDIES: I have personally reviewed the radiological images as listed and agreed with the findings in the report. No results found.   ASSESSMENT & PLAN:  Joe [name] is a 73 y.o. male with history of  1. CLL, stage 0 -Under observation.  -Labs reviewed, CBC showed Hg 17.5, WBC 14.4K Lymph Abs 6.0K.overall stable  -He is clinically stable and doing well.  Exam was unremarkable, no adenopathy.  No evidence of disease progression. -He will f/u with PCP in  2 months -He knows to call for any concerns  -f/u in one year  2. HTN -f/u with PCP  Plan  -He is clinically stable. Continue surveillance -f/u in one year  -Labs every 4 months  No problem-specific Assessment & Plan notes found for this encounter.   No orders of the defined types were placed in this encounter.  All questions were answered. The patient knows to call the clinic with any problems, questions or concerns. No barriers to learning was detected. I spent 10 minutes counseling the patient face to face. The total time spent in the appointment was 15 minutes and more than 50% was on counseling and review of test results  I, Noor Dweik am acting as scribe for Dr. Truitt Merle.  I have reviewed the above documentation for accuracy and completeness, and I agree with the above.     Truitt Merle, MD 03/30/2018

## 2018-03-30 ENCOUNTER — Inpatient Hospital Stay: Payer: Medicare Other | Attending: Hematology

## 2018-03-30 ENCOUNTER — Encounter: Payer: Self-pay | Admitting: Hematology

## 2018-03-30 ENCOUNTER — Inpatient Hospital Stay (HOSPITAL_BASED_OUTPATIENT_CLINIC_OR_DEPARTMENT_OTHER): Payer: Medicare Other | Admitting: Hematology

## 2018-03-30 ENCOUNTER — Telehealth: Payer: Self-pay

## 2018-03-30 VITALS — BP 141/78 | HR 72 | Temp 98.0°F | Resp 16 | Ht 71.0 in | Wt 200.5 lb

## 2018-03-30 DIAGNOSIS — E785 Hyperlipidemia, unspecified: Secondary | ICD-10-CM

## 2018-03-30 DIAGNOSIS — K219 Gastro-esophageal reflux disease without esophagitis: Secondary | ICD-10-CM

## 2018-03-30 DIAGNOSIS — C9110 Chronic lymphocytic leukemia of B-cell type not having achieved remission: Secondary | ICD-10-CM

## 2018-03-30 DIAGNOSIS — I10 Essential (primary) hypertension: Secondary | ICD-10-CM | POA: Insufficient documentation

## 2018-03-30 DIAGNOSIS — J449 Chronic obstructive pulmonary disease, unspecified: Secondary | ICD-10-CM | POA: Insufficient documentation

## 2018-03-30 DIAGNOSIS — Z79899 Other long term (current) drug therapy: Secondary | ICD-10-CM | POA: Diagnosis not present

## 2018-03-30 DIAGNOSIS — Z7982 Long term (current) use of aspirin: Secondary | ICD-10-CM | POA: Insufficient documentation

## 2018-03-30 LAB — CBC WITH DIFFERENTIAL/PLATELET
Abs Immature Granulocytes: 0.04 10*3/uL (ref 0.00–0.07)
Basophils Absolute: 0.1 10*3/uL (ref 0.0–0.1)
Basophils Relative: 1 %
EOS ABS: 0.2 10*3/uL (ref 0.0–0.5)
EOS PCT: 2 %
HEMATOCRIT: 55.2 % — AB (ref 39.0–52.0)
HEMOGLOBIN: 17.5 g/dL — AB (ref 13.0–17.0)
Immature Granulocytes: 0 %
LYMPHS ABS: 6 10*3/uL — AB (ref 0.7–4.0)
LYMPHS PCT: 41 %
MCH: 31 pg (ref 26.0–34.0)
MCHC: 31.7 g/dL (ref 30.0–36.0)
MCV: 97.9 fL (ref 80.0–100.0)
MONO ABS: 1.4 10*3/uL — AB (ref 0.1–1.0)
MONOS PCT: 9 %
NRBC: 0 % (ref 0.0–0.2)
Neutro Abs: 6.8 10*3/uL (ref 1.7–7.7)
Neutrophils Relative %: 47 %
Platelets: 256 10*3/uL (ref 150–400)
RBC: 5.64 MIL/uL (ref 4.22–5.81)
RDW: 13.1 % (ref 11.5–15.5)
WBC: 14.4 10*3/uL — ABNORMAL HIGH (ref 4.0–10.5)

## 2018-03-30 LAB — RETICULOCYTES
Immature Retic Fract: 13 % (ref 2.3–15.9)
RBC.: 5.64 MIL/uL (ref 4.22–5.81)
Retic Count, Absolute: 86.3 10*3/uL (ref 19.0–186.0)
Retic Ct Pct: 1.5 % (ref 0.4–3.1)

## 2018-03-30 LAB — COMPREHENSIVE METABOLIC PANEL
ALT: 38 U/L (ref 0–44)
AST: 31 U/L (ref 15–41)
Albumin: 4.1 g/dL (ref 3.5–5.0)
Alkaline Phosphatase: 79 U/L (ref 38–126)
Anion gap: 9 (ref 5–15)
BUN: 10 mg/dL (ref 8–23)
CO2: 32 mmol/L (ref 22–32)
CREATININE: 0.81 mg/dL (ref 0.61–1.24)
Calcium: 9.7 mg/dL (ref 8.9–10.3)
Chloride: 102 mmol/L (ref 98–111)
GFR calc non Af Amer: >60 mL/min (ref >60)
Glucose, Bld: 101 mg/dL — ABNORMAL HIGH (ref 70–99)
Potassium: 5.3 mmol/L — ABNORMAL HIGH (ref 3.5–5.1)
SODIUM: 143 mmol/L (ref 135–145)
Total Bilirubin: 1.4 mg/dL — ABNORMAL HIGH (ref 0.3–1.2)
Total Protein: 7.6 g/dL (ref 6.5–8.1)

## 2018-03-30 NOTE — Telephone Encounter (Signed)
Printed avs and calender of upcoming appointment. Per 1/9 los

## 2018-05-23 ENCOUNTER — Other Ambulatory Visit (INDEPENDENT_AMBULATORY_CARE_PROVIDER_SITE_OTHER): Payer: Medicare Other

## 2018-05-23 ENCOUNTER — Ambulatory Visit (INDEPENDENT_AMBULATORY_CARE_PROVIDER_SITE_OTHER): Payer: Medicare Other | Admitting: Internal Medicine

## 2018-05-23 ENCOUNTER — Encounter: Payer: Self-pay | Admitting: Internal Medicine

## 2018-05-23 VITALS — BP 138/76 | HR 68 | Temp 98.2°F | Ht 71.0 in | Wt 203.0 lb

## 2018-05-23 DIAGNOSIS — R635 Abnormal weight gain: Secondary | ICD-10-CM | POA: Diagnosis not present

## 2018-05-23 DIAGNOSIS — E785 Hyperlipidemia, unspecified: Secondary | ICD-10-CM

## 2018-05-23 DIAGNOSIS — R002 Palpitations: Secondary | ICD-10-CM

## 2018-05-23 DIAGNOSIS — I4892 Unspecified atrial flutter: Secondary | ICD-10-CM | POA: Diagnosis not present

## 2018-05-23 DIAGNOSIS — C9110 Chronic lymphocytic leukemia of B-cell type not having achieved remission: Secondary | ICD-10-CM

## 2018-05-23 DIAGNOSIS — E039 Hypothyroidism, unspecified: Secondary | ICD-10-CM

## 2018-05-23 LAB — MAGNESIUM: Magnesium: 2.1 mg/dL (ref 1.5–2.5)

## 2018-05-23 LAB — TSH: TSH: 3.16 u[IU]/mL (ref 0.35–4.50)

## 2018-05-23 MED ORDER — ATENOLOL 50 MG PO TABS: 50.0000 mg | ORAL_TABLET | Freq: Every day | ORAL | 3 refills | Status: DC | PRN

## 2018-05-23 NOTE — Progress Notes (Signed)
Subjective:  Patient ID: Joe [name], male    DOB: Sep 26, 1945  Age: 73 y.o. MRN: [id number]  CC: No chief complaint on file.   HPI Joe [name] presents for HTN, OA, hypothyroidism 6 mo f/u C/o wt gain The pt had an episode of SOB and palpitations in November, HR was 160-180  Outpatient Medications Prior to Visit  Medication Sig Dispense Refill  . amLODipine (NORVASC) 5 MG tablet Take 1 tablet (5 mg total) by mouth daily. 90 tablet 3  . aspirin (BAYER ASPIRIN) 325 MG tablet Take 1 tablet (325 mg total) by mouth daily. 100 tablet 3  . Bromfenac Sodium (PROLENSA OP) Apply 1 drop to eye. Right eye    . Cholecalciferol (EQL VITAMIN D3) 1000 UNITS tablet Take 1,000 Units by mouth daily.      . diazepam (VALIUM) 5 MG tablet Take 1 tablet (5 mg total) by mouth every 12 (twelve) hours. 180 tablet 1  . levothyroxine (SYNTHROID, LEVOTHROID) 50 MCG tablet TAKE 1 TABLET BY MOUTH EVERY DAY 90 tablet 1  . lovastatin (MEVACOR) 20 MG tablet Take 1 tablet (20 mg total) by mouth at bedtime. 90 tablet 3  . Multiple Vitamin (MULTIVITAMIN) tablet Take 1 tablet by mouth daily. Centrum Silver.    . prednisoLONE acetate (PRED FORTE) 1 % ophthalmic suspension Place 1 drop into the right eye 2 (two) times daily.     [name] levothyroxine (SYNTHROID, LEVOTHROID) 50 MCG tablet Take 1 tablet (50 mcg total) by mouth daily. 90 tablet 3   No facility-administered medications prior to visit.     ROS: Review of Systems  Constitutional: Positive for fatigue and unexpected weight change. Negative for appetite change.  HENT: Negative for congestion, nosebleeds, sneezing, sore throat and trouble swallowing.   Eyes: Negative for itching and visual disturbance.  Respiratory: Negative for cough.   Cardiovascular: Negative for chest pain, palpitations and leg swelling.  Gastrointestinal: Negative for abdominal distention, blood in stool, diarrhea and nausea.  Genitourinary: Negative for frequency and hematuria.    Musculoskeletal: Positive for arthralgias, back pain and gait problem. Negative for joint swelling and neck pain.  Skin: Negative for rash.  Neurological: Negative for dizziness, tremors, speech difficulty and weakness.  Psychiatric/Behavioral: Negative for agitation, dysphoric mood, sleep disturbance and suicidal ideas. The patient is not nervous/anxious.     Objective:  BP 138/76 (BP Location: Left Arm, Patient Position: Sitting, Cuff Size: Large)   Pulse 68   Temp 98.2 F (36.8 C) (Oral)   Ht 5\' 11"  (1.803 m)   Wt 203 lb (92.1 kg)   SpO2 91%   BMI 28.31 kg/m   BP Readings from Last 3 Encounters:  05/23/18 138/76  03/30/18 (!) 141/78  11/16/17 131/72    Wt Readings from Last 3 Encounters:  05/23/18 203 lb (92.1 kg)  03/30/18 200 lb 8 oz (90.9 kg)  11/16/17 194 lb (88 kg)    Physical Exam Constitutional:      General: He is not in acute distress.    Appearance: He is well-developed.     Comments: NAD  Eyes:     Conjunctiva/sclera: Conjunctivae normal.     Pupils: Pupils are equal, round, and reactive to light.  Neck:     Musculoskeletal: Normal range of motion.     Thyroid: No thyromegaly.     Vascular: No JVD.  Cardiovascular:     Rate and Rhythm: Normal rate and regular rhythm.     Heart sounds: Normal heart sounds. No  murmur. No friction rub. No gallop.   Pulmonary:     Effort: Pulmonary effort is normal. No respiratory distress.     Breath sounds: Normal breath sounds. No wheezing or rales.  Chest:     Chest wall: No tenderness.  Abdominal:     General: Bowel sounds are normal. There is no distension.     Palpations: Abdomen is soft. There is no mass.     Tenderness: There is no abdominal tenderness. There is no guarding or rebound.  Musculoskeletal: Normal range of motion.        General: No tenderness.  Lymphadenopathy:     Cervical: No cervical adenopathy.  Skin:    General: Skin is warm and dry.     Findings: No rash.  Neurological:     Mental  Status: He is alert and oriented to person, place, and time.     Cranial Nerves: No cranial nerve deficit.     Motor: No abnormal muscle tone.     Coordination: Coordination normal.     Gait: Gait normal.     Deep Tendon Reflexes: Reflexes are normal and symmetric.  Psychiatric:        Behavior: Behavior normal.        Thought Content: Thought content normal.        Judgment: Judgment normal.   obese cane  Lab Results  Component Value Date   WBC 14.4 (H) 03/30/2018   HGB 17.5 (H) 03/30/2018   HCT 55.2 (H) 03/30/2018   PLT 256 03/30/2018   GLUCOSE 101 (H) 03/30/2018   CHOL 152 11/07/2017   TRIG 152.0 (H) 11/07/2017   HDL 41.10 11/07/2017   LDLDIRECT 108.0 05/14/2016   LDLCALC 80 11/07/2017   ALT 38 03/30/2018   AST 31 03/30/2018   NA 143 03/30/2018   K 5.3 (H) 03/30/2018   CL 102 03/30/2018   CREATININE 0.81 03/30/2018   BUN 10 03/30/2018   CO2 32 03/30/2018   TSH 3.69 11/07/2017   PSA 0.17 11/07/2017    US Abdomen Complete  Result Date: 07/25/2014 CLINICAL DATA:  Elevated lymphocytes level ; assess status of the liver and spleen. EXAM: ULTRASOUND ABDOMEN COMPLETE COMPARISON:  None. FINDINGS: Gallbladder: The gallbladder is adequately distended. There are multiple echogenic mobile shadowing stones. There is no gallbladder wall thickening or pericholecystic fluid or positive sonographic Murphy's sign. Common bile duct: Diameter: 4.4 mm Liver: The hepatic echotexture is mildly increased. There is no focal mass or ductal dilation. IVC: No abnormality visualized. Pancreas: Visualized portion unremarkable. Spleen: Spleen is normal in size and echotexture. Right Kidney: Length: 12.0. Echogenicity within normal limits. No mass or hydronephrosis visualized. Left Kidney: Length: 12.6 Cm. Echogenicity within normal limits. There is a parapelvic cyst. No hydronephrosis visualized. Abdominal aorta: No aneurysm visualized. Other findings: There is no ascites. IMPRESSION: 1. Gallstones  without evidence of acute cholecystitis. 2. Increased hepatic echotexture is consistent with fatty infiltration. The S hepatic and splenic sizes are normal. 3. The kidneys are normal. Electronically Signed   By: David  Martinique M.D.   On: 07/25/2014 13:40    Assessment & Plan:   There are no diagnoses linked to this encounter.   No orders of the defined types were placed in this encounter.    Follow-up: No follow-ups on file.  Walker Kehr, MD

## 2018-05-23 NOTE — Assessment & Plan Note (Addendum)
The pt had an episode of SOB and palpitations in November 2019, HR was 160-180: A flutter vs PSVT Cardiol ref - Dr Radford Pax Tenormin 50 mg po prn, call 911

## 2018-05-23 NOTE — Patient Instructions (Signed)
Try fasting Sharman Cheek "The Obesity Code"  If you have medicare related insurance (such as traditional Medicare, [employer], [employer], or similar), Please make an appointment at the scheduling desk with Sharee Pimple, the [employer], for your Wellness visit in this office, which is a benefit with your insurance.

## 2018-05-23 NOTE — Assessment & Plan Note (Signed)
TSH 

## 2018-05-23 NOTE — Assessment & Plan Note (Signed)
Try fasting Joe [name] "The Obesity Code"

## 2018-05-23 NOTE — Assessment & Plan Note (Signed)
Atenolol prn

## 2018-05-23 NOTE — Assessment & Plan Note (Signed)
Stable F/u w/dr Burr Medico

## 2018-05-23 NOTE — Assessment & Plan Note (Signed)
Lovastatin 

## 2018-06-06 ENCOUNTER — Telehealth: Payer: Self-pay

## 2018-06-06 DIAGNOSIS — R002 Palpitations: Secondary | ICD-10-CM

## 2018-06-06 NOTE — Telephone Encounter (Signed)
Spoke with the patient, he expressed understanding about rescheduling. Dr. Radford Pax stated that he needs to have an event monitor ordered to rule out a flutter or SVT. The patient had no further questions. He needs follow up in 4-6 weeks.

## 2018-06-07 ENCOUNTER — Ambulatory Visit: Payer: Medicare Other | Admitting: Cardiology

## 2018-06-22 ENCOUNTER — Telehealth: Payer: Self-pay | Admitting: *Deleted

## 2018-06-22 NOTE — Telephone Encounter (Signed)
Patient called to arrange to have cardiac event monitor shipped to home.  Patient declined at this time.  He said he had one bad episode of a rapid heartbeat Nov.3, 2019 of 150-180 bpm for approximately 5 minutes and another small episode in January when his heart rate went up to 124 bpm for a short time.  His heart rate has been stable since then.  Dr. Alain Marion gave him a prescription to use if it happens again.  Patient wants to postpone monitor until after COVID-19 situation, when he can discuss with Dr. Radford Pax at an office visit. He will call office if symptoms re-occur.

## 2018-06-26 NOTE — Telephone Encounter (Signed)
Called patient and spoke with him about doing a video/telephone visit and he stated he is not interested in either and would like to wait and have an office visit. He stated he is doing well with no complaints and I instructed him to call office with any changes.

## 2018-07-07 ENCOUNTER — Encounter (INDEPENDENT_AMBULATORY_CARE_PROVIDER_SITE_OTHER): Payer: Medicare Other | Admitting: Ophthalmology

## 2018-07-24 ENCOUNTER — Telehealth: Payer: Self-pay

## 2018-07-24 NOTE — Telephone Encounter (Signed)
Spoke with patient per Dr. Burr Medico okay to postpone lab for 2 months.  Instructed him to call if he has any concerns or worsening symptoms.  He verbalized an understanding and a scheduling message was sent.

## 2018-07-25 ENCOUNTER — Telehealth: Payer: Self-pay | Admitting: Hematology

## 2018-07-25 NOTE — Telephone Encounter (Signed)
Scheduled lab in 2 mths per sch msg. Mailed printout

## 2018-07-27 ENCOUNTER — Inpatient Hospital Stay: Payer: Medicare Other

## 2018-09-13 ENCOUNTER — Other Ambulatory Visit: Payer: Self-pay

## 2018-09-13 ENCOUNTER — Encounter (INDEPENDENT_AMBULATORY_CARE_PROVIDER_SITE_OTHER): Payer: Medicare Other | Admitting: Ophthalmology

## 2018-09-13 DIAGNOSIS — H35372 Puckering of macula, left eye: Secondary | ICD-10-CM

## 2018-09-13 DIAGNOSIS — H33301 Unspecified retinal break, right eye: Secondary | ICD-10-CM

## 2018-09-13 DIAGNOSIS — H43811 Vitreous degeneration, right eye: Secondary | ICD-10-CM | POA: Diagnosis not present

## 2018-09-13 DIAGNOSIS — H35033 Hypertensive retinopathy, bilateral: Secondary | ICD-10-CM | POA: Diagnosis not present

## 2018-09-13 DIAGNOSIS — I10 Essential (primary) hypertension: Secondary | ICD-10-CM | POA: Diagnosis not present

## 2018-09-13 DIAGNOSIS — H338 Other retinal detachments: Secondary | ICD-10-CM | POA: Diagnosis not present

## 2018-09-13 DIAGNOSIS — H34831 Tributary (branch) retinal vein occlusion, right eye, with macular edema: Secondary | ICD-10-CM

## 2018-09-25 ENCOUNTER — Other Ambulatory Visit: Payer: Self-pay

## 2018-09-25 ENCOUNTER — Inpatient Hospital Stay: Payer: Medicare Other | Attending: Hematology

## 2018-09-25 DIAGNOSIS — C9110 Chronic lymphocytic leukemia of B-cell type not having achieved remission: Secondary | ICD-10-CM | POA: Insufficient documentation

## 2018-09-25 LAB — RETICULOCYTES
Immature Retic Fract: 10 % (ref 2.3–15.9)
RBC.: 5.57 MIL/uL (ref 4.22–5.81)
Retic Count, Absolute: 81.3 10*3/uL (ref 19.0–186.0)
Retic Ct Pct: 1.5 % (ref 0.4–3.1)

## 2018-09-25 LAB — CBC WITH DIFFERENTIAL (CANCER CENTER ONLY)
Abs Immature Granulocytes: 0.05 10*3/uL (ref 0.00–0.07)
Basophils Absolute: 0.1 10*3/uL (ref 0.0–0.1)
Basophils Relative: 0 %
Eosinophils Absolute: 0.2 10*3/uL (ref 0.0–0.5)
Eosinophils Relative: 1 %
HCT: 52.8 % — ABNORMAL HIGH (ref 39.0–52.0)
Hemoglobin: 17 g/dL (ref 13.0–17.0)
Immature Granulocytes: 0 %
Lymphocytes Relative: 49 %
Lymphs Abs: 8.3 10*3/uL — ABNORMAL HIGH (ref 0.7–4.0)
MCH: 30.5 pg (ref 26.0–34.0)
MCHC: 32.2 g/dL (ref 30.0–36.0)
MCV: 94.8 fL (ref 80.0–100.0)
Monocytes Absolute: 1.1 10*3/uL — ABNORMAL HIGH (ref 0.1–1.0)
Monocytes Relative: 7 %
Neutro Abs: 7.4 10*3/uL (ref 1.7–7.7)
Neutrophils Relative %: 43 %
Platelet Count: 305 10*3/uL (ref 150–400)
RBC: 5.57 MIL/uL (ref 4.22–5.81)
RDW: 13.1 % (ref 11.5–15.5)
WBC Count: 17.1 10*3/uL — ABNORMAL HIGH (ref 4.0–10.5)
nRBC: 0 % (ref 0.0–0.2)

## 2018-09-25 LAB — COMPREHENSIVE METABOLIC PANEL
ALT: 47 U/L — ABNORMAL HIGH (ref 0–44)
AST: 34 U/L (ref 15–41)
Albumin: 4 g/dL (ref 3.5–5.0)
Alkaline Phosphatase: 77 U/L (ref 38–126)
Anion gap: 10 (ref 5–15)
BUN: 8 mg/dL (ref 8–23)
CO2: 25 mmol/L (ref 22–32)
Calcium: 8.7 mg/dL — ABNORMAL LOW (ref 8.9–10.3)
Chloride: 108 mmol/L (ref 98–111)
Creatinine, Ser: 0.78 mg/dL (ref 0.61–1.24)
GFR calc Af Amer: >60 mL/min (ref >60)
GFR calc non Af Amer: >60 mL/min (ref >60)
Glucose, Bld: 96 mg/dL (ref 70–99)
Potassium: 4.3 mmol/L (ref 3.5–5.1)
Sodium: 143 mmol/L (ref 135–145)
Total Bilirubin: 0.8 mg/dL (ref 0.3–1.2)
Total Protein: 7.4 g/dL (ref 6.5–8.1)

## 2018-09-27 ENCOUNTER — Telehealth: Payer: Self-pay

## 2018-09-27 NOTE — Telephone Encounter (Signed)
Spoke with patient regarding lab results.  Per Dr. Burr Medico CLL is overall stable, continue monitoring, patient verbalized an understanding.

## 2018-09-27 NOTE — Telephone Encounter (Signed)
-----   Message from Truitt Merle, MD sent at 09/26/2018 11:44 AM EDT ----- Please let pt know his lab result, his CLL is overall stable, ALC slightly higher than before, will continue monitoring. Thanks  Truitt Merle  09/26/2018

## 2018-11-23 ENCOUNTER — Other Ambulatory Visit (INDEPENDENT_AMBULATORY_CARE_PROVIDER_SITE_OTHER): Payer: Medicare Other

## 2018-11-23 ENCOUNTER — Encounter: Payer: Self-pay | Admitting: Internal Medicine

## 2018-11-23 ENCOUNTER — Other Ambulatory Visit: Payer: Self-pay

## 2018-11-23 ENCOUNTER — Ambulatory Visit (INDEPENDENT_AMBULATORY_CARE_PROVIDER_SITE_OTHER): Payer: Medicare Other | Admitting: Internal Medicine

## 2018-11-23 VITALS — BP 130/80 | HR 69 | Temp 98.5°F | Ht 71.0 in | Wt 196.0 lb

## 2018-11-23 DIAGNOSIS — R002 Palpitations: Secondary | ICD-10-CM | POA: Diagnosis not present

## 2018-11-23 DIAGNOSIS — R269 Unspecified abnormalities of gait and mobility: Secondary | ICD-10-CM | POA: Diagnosis not present

## 2018-11-23 DIAGNOSIS — E039 Hypothyroidism, unspecified: Secondary | ICD-10-CM | POA: Diagnosis not present

## 2018-11-23 DIAGNOSIS — R635 Abnormal weight gain: Secondary | ICD-10-CM

## 2018-11-23 DIAGNOSIS — I10 Essential (primary) hypertension: Secondary | ICD-10-CM

## 2018-11-23 DIAGNOSIS — E785 Hyperlipidemia, unspecified: Secondary | ICD-10-CM

## 2018-11-23 DIAGNOSIS — Z23 Encounter for immunization: Secondary | ICD-10-CM | POA: Diagnosis not present

## 2018-11-23 DIAGNOSIS — N320 Bladder-neck obstruction: Secondary | ICD-10-CM

## 2018-11-23 DIAGNOSIS — C9110 Chronic lymphocytic leukemia of B-cell type not having achieved remission: Secondary | ICD-10-CM

## 2018-11-23 LAB — URINALYSIS
Bilirubin Urine: NEGATIVE
Hgb urine dipstick: NEGATIVE
Ketones, ur: NEGATIVE
Leukocytes,Ua: NEGATIVE
Nitrite: NEGATIVE
Specific Gravity, Urine: 1.02 (ref 1.000–1.030)
Total Protein, Urine: NEGATIVE
Urine Glucose: NEGATIVE
Urobilinogen, UA: 0.2 (ref 0.0–1.0)
pH: 5.5 (ref 5.0–8.0)

## 2018-11-23 LAB — LDL CHOLESTEROL, DIRECT: Direct LDL: 99 mg/dL

## 2018-11-23 LAB — BASIC METABOLIC PANEL
BUN: 7 mg/dL (ref 6–23)
CO2: 33 mEq/L — ABNORMAL HIGH (ref 19–32)
Calcium: 9.8 mg/dL (ref 8.4–10.5)
Chloride: 102 mEq/L (ref 96–112)
Creatinine, Ser: 0.82 mg/dL (ref 0.40–1.50)
GFR: 92.17 mL/min (ref >60.00)
Glucose, Bld: 98 mg/dL (ref 70–99)
Potassium: 4.9 mEq/L (ref 3.5–5.1)
Sodium: 142 mEq/L (ref 135–145)

## 2018-11-23 LAB — LIPID PANEL
Cholesterol: 153 mg/dL (ref 0–200)
HDL: 38.2 mg/dL — ABNORMAL LOW (ref >39.00)
NonHDL: 114.67
Total CHOL/HDL Ratio: 4
Triglycerides: 201 mg/dL — ABNORMAL HIGH (ref 0.0–149.0)
VLDL: 40.2 mg/dL — ABNORMAL HIGH (ref 0.0–40.0)

## 2018-11-23 LAB — HEPATIC FUNCTION PANEL
ALT: 39 U/L (ref 0–53)
AST: 30 U/L (ref 0–37)
Albumin: 4.3 g/dL (ref 3.5–5.2)
Alkaline Phosphatase: 74 U/L (ref 39–117)
Bilirubin, Direct: 0.2 mg/dL (ref 0.0–0.3)
Total Bilirubin: 1.7 mg/dL — ABNORMAL HIGH (ref 0.2–1.2)
Total Protein: 7.1 g/dL (ref 6.0–8.3)

## 2018-11-23 LAB — PSA: PSA: 0.23 ng/mL (ref 0.10–4.00)

## 2018-11-23 NOTE — Assessment & Plan Note (Signed)
Amlodipine.

## 2018-11-23 NOTE — Assessment & Plan Note (Addendum)
7/20 CBC was stable

## 2018-11-23 NOTE — Progress Notes (Signed)
Subjective:  Patient ID: Joe [name], male    DOB: 1945/09/22  Age: 73 y.o. MRN: [id number]  CC: No chief complaint on file.   HPI HUMZAH HARTY presents for LBP, OA, hypothyroidism f/u  Outpatient Medications Prior to Visit  Medication Sig Dispense Refill  . amLODipine (NORVASC) 5 MG tablet Take 1 tablet (5 mg total) by mouth daily. 90 tablet 3  . aspirin (BAYER ASPIRIN) 325 MG tablet Take 1 tablet (325 mg total) by mouth daily. 100 tablet 3  . atenolol (TENORMIN) 50 MG tablet Take 1 tablet (50 mg total) by mouth daily as needed. For palpitations 30 tablet 3  . Bromfenac Sodium (PROLENSA OP) Apply 1 drop to eye. Right eye    . Cholecalciferol (EQL VITAMIN D3) 1000 UNITS tablet Take 1,000 Units by mouth daily.      . diazepam (VALIUM) 5 MG tablet Take 1 tablet (5 mg total) by mouth every 12 (twelve) hours. 180 tablet 1  . levothyroxine (SYNTHROID, LEVOTHROID) 50 MCG tablet TAKE 1 TABLET BY MOUTH EVERY DAY 90 tablet 1  . lovastatin (MEVACOR) 20 MG tablet Take 1 tablet (20 mg total) by mouth at bedtime. 90 tablet 3  . Multiple Vitamin (MULTIVITAMIN) tablet Take 1 tablet by mouth daily. Centrum Silver.    . prednisoLONE acetate (PRED FORTE) 1 % ophthalmic suspension Place 1 drop into the right eye 2 (two) times daily.      No facility-administered medications prior to visit.     ROS: Review of Systems  Constitutional: Negative for appetite change, fatigue and unexpected weight change.  HENT: Negative for congestion, nosebleeds, sneezing, sore throat and trouble swallowing.   Eyes: Negative for itching and visual disturbance.  Respiratory: Negative for cough.   Cardiovascular: Negative for chest pain, palpitations and leg swelling.  Gastrointestinal: Negative for abdominal distention, blood in stool, diarrhea and nausea.  Genitourinary: Negative for frequency and hematuria.  Musculoskeletal: Positive for arthralgias, back pain and gait problem. Negative for joint swelling and  neck pain.  Skin: Negative for rash.  Neurological: Negative for dizziness, tremors, speech difficulty and weakness.  Psychiatric/Behavioral: Negative for agitation, dysphoric mood, sleep disturbance and suicidal ideas. The patient is not nervous/anxious.     Objective:  BP 130/80 (BP Location: Left Arm, Patient Position: Sitting, Cuff Size: Large)   Pulse 69   Temp 98.5 F (36.9 C) (Oral)   Ht 5\' 11"  (1.803 m)   Wt 196 lb (88.9 kg)   SpO2 94%   BMI 27.34 kg/m   BP Readings from Last 3 Encounters:  11/23/18 130/80  05/23/18 138/76  03/30/18 (!) 141/78    Wt Readings from Last 3 Encounters:  11/23/18 196 lb (88.9 kg)  05/23/18 203 lb (92.1 kg)  03/30/18 200 lb 8 oz (90.9 kg)    Physical Exam Constitutional:      General: He is not in acute distress.    Appearance: He is well-developed.     Comments: NAD  Eyes:     Conjunctiva/sclera: Conjunctivae normal.     Pupils: Pupils are equal, round, and reactive to light.  Neck:     Musculoskeletal: Normal range of motion.     Thyroid: No thyromegaly.     Vascular: No JVD.  Cardiovascular:     Rate and Rhythm: Normal rate and regular rhythm.     Heart sounds: Normal heart sounds. No murmur. No friction rub. No gallop.   Pulmonary:     Effort: Pulmonary effort is normal. No  respiratory distress.     Breath sounds: Normal breath sounds. No wheezing or rales.  Chest:     Chest wall: No tenderness.  Abdominal:     General: Bowel sounds are normal. There is no distension.     Palpations: Abdomen is soft. There is no mass.     Tenderness: There is no abdominal tenderness. There is no guarding or rebound.  Musculoskeletal: Normal range of motion.        General: Tenderness present.  Lymphadenopathy:     Cervical: No cervical adenopathy.  Skin:    General: Skin is warm and dry.     Findings: No rash.  Neurological:     Mental Status: He is alert and oriented to person, place, and time.     Cranial Nerves: No cranial nerve  deficit.     Motor: Weakness present. No abnormal muscle tone.     Coordination: Coordination abnormal.     Gait: Gait abnormal.     Deep Tendon Reflexes: Reflexes are normal and symmetric.  Psychiatric:        Behavior: Behavior normal.        Thought Content: Thought content normal.        Judgment: Judgment normal.   cane  Lab Results  Component Value Date   WBC 17.1 (H) 09/25/2018   HGB 17.0 09/25/2018   HCT 52.8 (H) 09/25/2018   PLT 305 09/25/2018   GLUCOSE 96 09/25/2018   CHOL 152 11/07/2017   TRIG 152.0 (H) 11/07/2017   HDL 41.10 11/07/2017   LDLDIRECT 108.0 05/14/2016   LDLCALC 80 11/07/2017   ALT 47 (H) 09/25/2018   AST 34 09/25/2018   NA 143 09/25/2018   K 4.3 09/25/2018   CL 108 09/25/2018   CREATININE 0.78 09/25/2018   BUN 8 09/25/2018   CO2 25 09/25/2018   TSH 3.16 05/23/2018   PSA 0.17 11/07/2017    US Abdomen Complete  Result Date: 07/25/2014 CLINICAL DATA:  Elevated lymphocytes level ; assess status of the liver and spleen. EXAM: ULTRASOUND ABDOMEN COMPLETE COMPARISON:  None. FINDINGS: Gallbladder: The gallbladder is adequately distended. There are multiple echogenic mobile shadowing stones. There is no gallbladder wall thickening or pericholecystic fluid or positive sonographic Murphy's sign. Common bile duct: Diameter: 4.4 mm Liver: The hepatic echotexture is mildly increased. There is no focal mass or ductal dilation. IVC: No abnormality visualized. Pancreas: Visualized portion unremarkable. Spleen: Spleen is normal in size and echotexture. Right Kidney: Length: 12.0. Echogenicity within normal limits. No mass or hydronephrosis visualized. Left Kidney: Length: 12.6 Cm. Echogenicity within normal limits. There is a parapelvic cyst. No hydronephrosis visualized. Abdominal aorta: No aneurysm visualized. Other findings: There is no ascites. IMPRESSION: 1. Gallstones without evidence of acute cholecystitis. 2. Increased hepatic echotexture is consistent with fatty  infiltration. The S hepatic and splenic sizes are normal. 3. The kidneys are normal. Electronically Signed   By: David  Martinique M.D.   On: 07/25/2014 13:40    Assessment & Plan:   Diagnoses and all orders for this visit:  Need for influenza vaccination -     Flu Vaccine QUAD High Dose(Fluad)     No orders of the defined types were placed in this encounter.    Follow-up: No follow-ups on file.  Walker Kehr, MD

## 2018-11-23 NOTE — Patient Instructions (Addendum)
If you have medicare related insurance (such as traditional Medicare, [employer], [employer], or similar), Please make an appointment at the scheduling desk with Sharee Pimple, the [employer], for your Wellness visit in this office, which is a benefit with your insurance.    These suggestions will probably help you to improve your metabolism if you are not overweight and to lose weight if you are overweight: 1.  Reduce your consumption of sugars and starches.  Eliminate high fructose corn syrup from your diet.  Reduce your consumption of processed foods.  For desserts try to have seasonal fruits, berries, nuts, cheeses or dark chocolate with more than 70% cacao. 2.  Do not snack 3.  You do not have to eat breakfast.  If you choose to have breakfast-eat plain greek yogurt, eggs, oatmeal (without sugar) 4.  Drink water, freshly brewed unsweetened tea (green, black or herbal) or coffee.  Do not drink sodas including diet sodas , juices, beverages sweetened with artificial sweeteners. 5.  Reduce your consumption of refined grains. 6.  Avoid protein drinks such as Optifast, Slim fast etc. Eat chicken, fish, meat, dairy and beans for your sources of protein 7.  Natural unprocessed fats like cold pressed virgin olive oil, butter, coconut oil are good for you.  Eat avocados 8.  Increase your consumption of fiber.  Fruits, berries, vegetables, whole grains, flaxseeds, Chia seeds, beans, popcorn, nuts, oatmeal are good sources of fiber 9.  Use vinegar in your diet, i.e. apple cider vinegar, red wine or balsamic vinegar 10.  You can try fasting.  For example you can skip breakfast and lunch every other day (24-hour fast) 11.  Stress reduction, good night sleep, relaxation, meditation, yoga and other physical activity is likely to help you to maintain low weight too. 12.  If you drink alcohol, limit your alcohol intake to no more than 2 drinks a day.   Mediterranean diet is good for  you. ([employer] Mikle Bosworth has a typical Mediterranean cuisine menu) The Mediterranean diet is a way of eating based on the traditional cuisine of countries bordering the [employer]. While there is no single definition of the Mediterranean diet, it is typically high in vegetables, fruits, whole grains, beans, nut and seeds, and olive oil. The main components of Mediterranean diet include: [name] Daily consumption of vegetables, fruits, whole grains and healthy fats  . Weekly intake of fish, poultry, beans and eggs  . Moderate portions of dairy products  . Limited intake of red meat Other important elements of the Mediterranean diet are sharing meals with family and friends, enjoying a glass of red wine and being physically active. Health benefits of a Mediterranean diet: A traditional Mediterranean diet consisting of large quantities of fresh fruits and vegetables, nuts, fish and olive oil-coupled with physical activity-can reduce your risk of serious mental and physical health problems by: Preventing heart disease and strokes. Following a Mediterranean diet limits your intake of refined breads, processed foods, and red meat, and encourages drinking red wine instead of hard liquor-all factors that can help prevent heart disease and stroke. Keeping you agile. If you're an older adult, the nutrients gained with a Mediterranean diet may reduce your risk of developing muscle weakness and other signs of frailty by about 70 percent. Reducing the risk of Alzheimer's. Research suggests that the Maries diet may improve cholesterol, blood sugar levels, and overall blood vessel health, which in turn may reduce your risk of Alzheimer's disease or dementia. Halving the  risk of Parkinson's disease. The high levels of antioxidants in the Mediterranean diet can prevent cells from undergoing a damaging process called oxidative stress, thereby cutting the risk of Parkinson's disease in half. Increasing longevity. By  reducing your risk of developing heart disease or cancer with the Mediterranean diet, you're reducing your risk of death at any age by 20%. Protecting against type 2 diabetes. A Mediterranean diet is rich in fiber which digests slowly, prevents huge swings in blood sugar, and can help you maintain a healthy weight.    Cabbage soup recipe that will not make you gain weight: Take 1 small head of cabbage, 1 average pack of celery, 4 green peppers, 4 onions, 2 cans diced tomatoes (they are not available without salt), salt and spices to taste.  Chop cabbage, celery, peppers and onions.  And tomatoes and 2-2.5 liters (2.5 quarts) of water so that it would just cover the vegetables.  Bring to boil.  Add spices and salt.  Turn heat to low/medium and simmer for 20-25 minutes.  Naturally, you can make a smaller batch and change some of the ingredients.

## 2018-11-23 NOTE — Assessment & Plan Note (Signed)
Levothyroxine

## 2018-11-23 NOTE — Assessment & Plan Note (Signed)
Chronic due to spine surgery complications remote [facility]

## 2018-11-23 NOTE — Assessment & Plan Note (Signed)
Lovastatin 

## 2018-11-23 NOTE — Assessment & Plan Note (Signed)
Pt lost wt 

## 2018-11-23 NOTE — Assessment & Plan Note (Signed)
Atenolol prn Appt w/Dr Radford Pax is pendding

## 2018-12-12 NOTE — Progress Notes (Signed)
Cardiology Office Note    Date:  12/13/2018   ID:  Joe [name], DOB 1945-12-21, MRN [id number]  PCP:  Joe Anger, MD  Cardiologist:  Joe Him, MD   Chief Complaint  Patient presents with  . Hypertension  . Irregular Heart Beat    History of Present Illness:  Joe [name] is a 73 y.o. male who is being seen today for the evaluation of palpitations at the request of Joe [name], [name], MD.  Joe [name] is a 73 y.o. male with a hx of HTN .  He was seen by me in 2015 when he had an EKG that the PCP though was afib but after review there was no findings of afib on EKG. He has not been seen by me since 2015.  He recently was seen by his PCP and complained of palpitations.  He tells me that he first started noticing palpitations back last November.  He was sitting on the couch and suddenly felt onset of rapid hard pounding in his chest. This lasted about 30 min and then subsided.  His HR got as high as 180bpm and then went back into the 70's.  Since then he has had several episodes of palpitations since November sporadically through the year.    He saw Joe. Alain [name] in March and was placed on Atenolol 50mg  PRN for palpitations.  He has continued to have intermittent palpitations which the Atenolol helps. Usually they do not last more than 30 minutes at a time. He will occasionally feel pressure in his chest with them but has not exertional CP.  He has chronic DOE related to COPD but this has not changed.  He is also concerned that he may have sleep apnea.  He does not think that he snores but seems to wake up a lot at night to urinate and he PCP was concerned that he may have underlying OSA triggering palpitations. He notices that his O2 sats drop at night into the mid 80's.  He denies any exertional CP, PND, orthopnea, dizziness or syncope.    He also states that he recently got a severe cramp in his calf that is still there with a knot and is tender to touch.  He has  not injured it and says that is just started one day recently.      Past Medical History:  Diagnosis Date  . Alcohol abuse, daily use 08/24/2010   Stopped 2015   . Arthralgia 12/13/2013   9/15 Not related to statins OA   . Cataract   . 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 CLL (chronic lymphocytic leukemia) ([place]) 08/08/2014   2016 Joe Joe [name] Stage 0  . CONJUNCTIVITIS, BACTERIAL, ACUTE 02/22/2009   Qualifier: Diagnosis of  By: Joe Browner MD, Joe [name]    . COPD (chronic obstructive pulmonary disease) (HCC)    sats 93-97 % per pt.  [name] COPD mixed type ([place]) 07/26/2007   Smoker - stopped 6/14   . De Quervain's tenosynovitis, right 12/13/2013   2015   . DOE (dyspnea on exertion) 03/26/2014   1/16 deconditioning, COPD vs other   . Dysuria 12/13/2013   9/15 - poss stricture Urol ref was offered   . Gallstones 11/16/2017   Asymptomatic Pt refused surg ref  . Generalized anxiety disorder 09/07/2012   Chronic   Potential benefits of a long term steroid  use as well as potential risks  and complications were explained  to the patient and were aknowledged.     [name] GERD 12/02/2006   Chronic     . GERD (gastroesophageal reflux disease)   . Grief May 29, 2016   [name] died in 2014-06-14  . Gynecomastia 12/28/2013   Benign B 06-13-13   . Hyperlipidemia   . Hypertension   . Hypothyroidism 12/25/2014   Jun 14, 2014 On Levothyroxine   . Intertrigo 02/02/2012   11/13   . Irregular heart beat    benign per pt.  . Left hand pain 05/31/2017  . Neoplasm of uncertain behavior of skin 03/12/2013   12/14 R ear, chest   . Night sweats 03/12/2013   12/14   . Pain of [name] 05/31/2017  . Palpitations 03/27/2013   Labs Atenolol prn  . 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 PERIORBITAL CELLULITIS 02/22/2009   Qualifier: Diagnosis of  By: Joe Browner MD, Joe [name]    . 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 Shoulder pain 05/31/2017  . Thyroid disease   . Weight gain 05/23/2018   Try fasting Joe [name] "The Obesity  Code"    Past Surgical History:  Procedure Laterality Date  . BICEPS TENDON REPAIR    . CATARACT EXTRACTION Left   . COLONOSCOPY  04-14-99   Joe [name] polyp-TA in epic  . POLYPECTOMY  04-14-99  . POSTERIOR LAMINECTOMY / DECOMPRESSION CERVICAL SPINE     Joe Joe [name]  . RETINAL DETACHMENT SURGERY     left eye, June 13, 2005 x2, 2006/06/14 x 3  . ROTATOR CUFF REPAIR  06/14/2002   right  . TONSILLECTOMY  [id number]    Current Medications: Current Meds  Medication Sig  . amLODipine (NORVASC) 5 MG tablet Take 1 tablet (5 mg total) by mouth daily.  [name] aspirin (BAYER ASPIRIN) 325 MG tablet Take 1 tablet (325 mg total) by mouth daily.  [name] atenolol (TENORMIN) 50 MG tablet Take 1 tablet (50 mg total) by mouth daily as needed. For palpitations  . Cholecalciferol (EQL VITAMIN D3) 1000 UNITS tablet Take 1,000 Units by mouth daily.    . diazepam (VALIUM) 5 MG tablet Take 1 tablet (5 mg total) by mouth every 12 (twelve) hours.  [name] levothyroxine (SYNTHROID, LEVOTHROID) 50 MCG tablet TAKE 1 TABLET BY MOUTH EVERY DAY  . lovastatin (MEVACOR) 20 MG tablet Take 1 tablet (20 mg total) by mouth at bedtime.  . Multiple Vitamin (MULTIVITAMIN) tablet Take 1 tablet by mouth daily. Centrum Silver.    Allergies:   Patient has no known allergies.   Social History   Socioeconomic History  . Marital status: Widowed    Spouse name: Not on file  . Number of children: 1  . Years of education: Not on file  . Highest education level: Not on file  Occupational History  . Occupation: retired  Scientific laboratory technician  . Financial resource strain: Not hard at all  . Food insecurity    Worry: Never true    Inability: Never true  . Transportation needs    Medical: No    Non-medical: No  Tobacco Use  . Smoking status: Former Smoker    Packs/day: 0.80    Years: 50.00    Pack years: 40.00    Types: Cigarettes    Quit date: 08/20/2012    Years since quitting: 6.3  . Smokeless tobacco: Never Used  Substance and Sexual Activity  . Alcohol use:  Not Currently    Alcohol/week: 28.0 standard drinks    Types: 28 Cans of beer per  week    Comment: he used to drink moderate to heayly for 45 years, quit one year ago   . Drug use: No  . Sexual activity: Not Currently  Lifestyle  . Physical activity    Days per week: 3 days    Minutes per session: 40 min  . Stress: Not at all  Relationships  . Social connections    Talks on phone: More than three times a week    Gets together: More than three times a week    Attends religious service: 1 to 4 times per year    Active member of club or organization: Not on file    Attends meetings of clubs or organizations: More than 4 times per year    Relationship status: Widowed  Other Topics Concern  . Not on file  Social History Narrative  . Not on file     Family History:  The patient's family history includes COPD in his mother; Cancer (age of onset: 25) in his paternal aunt; Coronary artery disease in an other family member; Diabetes in his father.   ROS:   Please see the history of present illness.    ROS All other systems reviewed and are negative.  No flowsheet data found.     PHYSICAL EXAM:   VS:  BP 136/86   Pulse 72   Ht 5\' 11"  (1.803 m)   Wt 199 lb 12.8 oz (90.6 kg)   SpO2 94%   BMI 27.87 kg/m    GEN: Well nourished, well developed, in no acute distress  HEENT: normal  Neck: no JVD, carotid bruits, or masses Cardiac: RRR; no murmurs, rubs, or gallops,no edema.  Intact distal pulses bilaterally.  Respiratory:  clear to auscultation bilaterally, normal work of breathing GI: soft, nontender, nondistended, + BS MS: no deformity or atrophy  Skin: warm and dry, no rash Neuro:  Alert and Oriented x 3, Strength and sensation are intact Psych: euthymic mood, full affect  Wt Readings from Last 3 Encounters:  12/13/18 199 lb 12.8 oz (90.6 kg)  11/23/18 196 lb (88.9 kg)  05/23/18 203 lb (92.1 kg)      Studies/Labs Reviewed:   EKG:  EKG is ordered today.  The ekg  ordered today demonstrates NSR with no ST changes.    Recent Labs: 05/23/2018: Magnesium 2.1; TSH 3.16 09/25/2018: Hemoglobin 17.0; Platelet Count 305 11/23/2018: ALT 39; BUN 7; Creatinine, Ser 0.82; Potassium 4.9; Sodium 142   Lipid Panel    Component Value Date/Time   CHOL 153 11/23/2018 1035   TRIG 201.0 (H) 11/23/2018 1035   HDL 38.20 (L) 11/23/2018 1035   CHOLHDL 4 11/23/2018 1035   VLDL 40.2 (H) 11/23/2018 1035   LDLCALC 80 11/07/2017 0859   LDLDIRECT 99.0 11/23/2018 1035    Additional studies/ records that were reviewed today include:  Office notes from PCP    ASSESSMENT:    1. Palpitations   2. Essential hypertension   3. Other insomnia   4. Calf pain, unspecified laterality      PLAN:  In order of problems listed above:  1.  Palpitations -these have been occurring sporadically for a year with HRs up to the 180's -will get an event monitor to make sure he is not having PAF  2.  HTN -BP is controlled on exam today -continue on amlodipine 5mg  daily  3.  Frequent awakenings at night/ nighttime hypoxia -he is waking up a lot during the night to urinate which is new  for [name] -he is concerned that he may have OSA that may be triggering his palptiations -he has noticed that he is having O2 sats drop into the mid 80's during the night ? If this is related to undx OSA vs. COPD. -I will get an Itamar home sleep study  4.  Left calf pain -this occurred sporadically and is tender in the calf area -need to rule out DVT -will get LLE venous doppler    Medication Adjustments/Labs and Tests Ordered: Current medicines are reviewed at length with the patient today.  Concerns regarding medicines are outlined above.  Medication changes, Labs and Tests ordered today are listed in the Patient Instructions below.  Patient Instructions  Medication Instructions:  Your physician recommends that you continue on your current medications as directed. Please refer to the Current  Medication list given to you today.   If you need a refill on your cardiac medications before your next appointment, please call your pharmacy.   Lab work: None ordered  If you have labs (blood work) drawn today and your tests are completely normal, you will receive your results only by: [name] MyChart Message (if you have MyChart) OR . A paper copy in the mail If you have any lab test that is abnormal or we need to change your treatment, we will call you to review the results.  Testing/Procedures: Your physician has recommended that you have a sleep study someone will call you t get this scheduled). This test records several body functions during sleep, including: brain activity, eye movement, oxygen and carbon dioxide blood levels, heart rate and rhythm, breathing rate and rhythm, the flow of air through your mouth and nose, snoring, body muscle movements, and chest and belly movement.  Your physician has recommended that you wear an event monitor (someone will call you to arrange this). Event monitors are medical devices that record the heart's electrical activity. Doctors most often [place] these monitors to diagnose arrhythmias. Arrhythmias are problems with the speed or rhythm of the heartbeat. The monitor is a small, portable device. You can wear one while you do your normal daily activities. This is usually used to diagnose what is causing palpitations/syncope (passing out).  Your physician has requested that you have a lower extremity arterial exercise duplex. During this test, exercise and ultrasound are used to evaluate arterial blood flow in the legs. Allow one hour for this exam. There are no restrictions or special instructions.   Follow-Up: At [facility], you and your health needs are our priority.  As part of our continuing mission to provide you with exceptional heart care, we have created designated Provider Care Teams.  These Care Teams include your primary Cardiologist (physician)  and Advanced Practice Providers (APPs -  Physician Assistants and Nurse Practitioners) who all work together to provide you with the care you need, when you need it. You will need a follow up appointment in AS NEEDED  Any Other Special Instructions Will Be Listed Below (If Applicable).       Signed, Joe Him, MD  12/13/2018 11:28 AM    [address], [place], [place]  [address] Phone: [phone]; Fax: [phone]

## 2018-12-13 ENCOUNTER — Ambulatory Visit (INDEPENDENT_AMBULATORY_CARE_PROVIDER_SITE_OTHER): Payer: Medicare Other | Admitting: Cardiology

## 2018-12-13 ENCOUNTER — Other Ambulatory Visit (HOSPITAL_COMMUNITY): Payer: Self-pay | Admitting: Cardiology

## 2018-12-13 ENCOUNTER — Encounter: Payer: Self-pay | Admitting: Cardiology

## 2018-12-13 ENCOUNTER — Telehealth: Payer: Self-pay | Admitting: *Deleted

## 2018-12-13 ENCOUNTER — Other Ambulatory Visit: Payer: Self-pay

## 2018-12-13 VITALS — BP 136/86 | HR 72 | Ht 71.0 in | Wt 199.8 lb

## 2018-12-13 DIAGNOSIS — R002 Palpitations: Secondary | ICD-10-CM

## 2018-12-13 DIAGNOSIS — M79669 Pain in unspecified lower leg: Secondary | ICD-10-CM

## 2018-12-13 DIAGNOSIS — I10 Essential (primary) hypertension: Secondary | ICD-10-CM

## 2018-12-13 DIAGNOSIS — G4709 Other insomnia: Secondary | ICD-10-CM

## 2018-12-13 DIAGNOSIS — I739 Peripheral vascular disease, unspecified: Secondary | ICD-10-CM

## 2018-12-13 NOTE — Telephone Encounter (Signed)
Patient Name: Joe [name]        DOB: Jan 12, 1946      Height: 5'11"    Weight:199lb  Office Name:[facility]         Referring Provider:Traci Radford Pax, MD  Today's Date:12/13/18  Date:   STOP BANG RISK ASSESSMENT S (snore) Have you been told that you snore?     NO   T (tired) Are you often tired, fatigued, or sleepy during the day?   NO  O (obstruction) Do you stop breathing, choke, or gasp during sleep? NO   P (pressure) Do you have or are you being treated for high blood pressure? YES   B (BMI) Is your body index greater than 35 kg/m? NO   A (age) Are you 22 years old or older? YES   N (neck) Do you have a neck circumference greater than 16 inches?   NO   G (gender) Are you a male? YES   TOTAL STOP/BANG "YES" ANSWERS                                                                        For Office Use Only              Procedure Order Form    YES to 3+ Stop Bang questions OR two clinical symptoms - patient qualifies for WatchPAT (CPT 95800)     Submit: This Form + Patient Face Sheet + Clinical Note via CloudPAT or Fax: [phone]         Clinical Notes: Will consult Sleep Specialist and refer for management of therapy due to patient increased risk of Sleep Apnea. Ordering a sleep study due to the following two clinical symptoms: Excessive daytime sleepiness G47.10 / Gastroesophageal reflux K21.9 / Nocturia R35.1 / Morning Headaches G44.221 / Difficulty concentrating R41.840 / Memory problems or poor judgment G31.84 / Personality changes or irritability R45.4 / Loud snoring R06.83 / Depression F32.9 / Unrefreshed by sleep G47.8 / Impotence N52.9 / History of high blood pressure R03.0 / Insomnia G47.00    I understand that I am proceeding with a home sleep apnea test as ordered by my treating physician. I understand that untreated sleep apnea is a serious cardiovascular risk factor and it is my responsibility to perform the test and seek management for sleep apnea. I  will be contacted with the results and be managed for sleep apnea by a local sleep physician. I will be receiving equipment and further instructions from [facility]. I shall promptly ship back the equipment via the included mailing label. I understand my insurance will be billed for the test and as the patient I am responsible for any insurance related out-of-pocket costs incurred. I have been provided with written instructions and can call for additional video or telephonic instruction, with 24-hour availability of qualified personnel to answer any questions: Patient Help Desk [phone].  Patient Signature   [name] ______________________________________________________   Date____9/23/2020__________________ Patient Telemedicine Verbal Consent

## 2018-12-13 NOTE — Patient Instructions (Signed)
Medication Instructions:  Your physician recommends that you continue on your current medications as directed. Please refer to the Current Medication list given to you today.   If you need a refill on your cardiac medications before your next appointment, please call your pharmacy.   Lab work: None ordered  If you have labs (blood work) drawn today and your tests are completely normal, you will receive your results only by: [name] MyChart Message (if you have MyChart) OR . A paper copy in the mail If you have any lab test that is abnormal or we need to change your treatment, we will call you to review the results.  Testing/Procedures: Your physician has recommended that you have a sleep study someone will call you t get this scheduled). This test records several body functions during sleep, including: brain activity, eye movement, oxygen and carbon dioxide blood levels, heart rate and rhythm, breathing rate and rhythm, the flow of air through your mouth and nose, snoring, body muscle movements, and chest and belly movement.  Your physician has recommended that you wear an event monitor (someone will call you to arrange this). Event monitors are medical devices that record the heart's electrical activity. Doctors most often [place] these monitors to diagnose arrhythmias. Arrhythmias are problems with the speed or rhythm of the heartbeat. The monitor is a small, portable device. You can wear one while you do your normal daily activities. This is usually used to diagnose what is causing palpitations/syncope (passing out).  Your physician has requested that you have a lower extremity arterial exercise duplex. During this test, exercise and ultrasound are used to evaluate arterial blood flow in the legs. Allow one hour for this exam. There are no restrictions or special instructions.   Follow-Up: At [facility], you and your health needs are our priority.  As part of our continuing mission to provide you  with exceptional heart care, we have created designated Provider Care Teams.  These Care Teams include your primary Cardiologist (physician) and Advanced Practice Providers (APPs -  Physician Assistants and Nurse Practitioners) who all work together to provide you with the care you need, when you need it. You will need a follow up appointment in AS NEEDED  Any Other Special Instructions Will Be Listed Below (If Applicable).

## 2018-12-13 NOTE — Telephone Encounter (Signed)
It[facility] Sleep Study ordered in [place].  Per Dr Radford Pax: I will get an Itamar home sleep study.

## 2018-12-13 NOTE — Telephone Encounter (Signed)
-----   Message from Lauralee Evener, [place] sent at 12/13/2018 11:19 AM EDT ----- Regarding: RE: Joe [name] SLEEP STUDY  ----- Message ----- From: Jeanann Lewandowsky, RMA Sent: 12/13/2018  11:10 AM EDT To: [facility] Sleep Studies Subject: Joe [name] SLEEP STUDY                             PT NEEDS ITAMAR SLEEP STUDY

## 2018-12-14 ENCOUNTER — Telehealth: Payer: Self-pay | Admitting: *Deleted

## 2018-12-14 NOTE — Telephone Encounter (Signed)
[employer] to ship a 30 day cardiac event monitor to the patients home.  Instructions reviewed briefly as they are included in the monitor kit. 

## 2018-12-20 ENCOUNTER — Telehealth: Payer: Self-pay

## 2018-12-20 ENCOUNTER — Other Ambulatory Visit: Payer: Self-pay

## 2018-12-20 ENCOUNTER — Ambulatory Visit (HOSPITAL_COMMUNITY)
Admission: RE | Admit: 2018-12-20 | Discharge: 2018-12-20 | Disposition: A | Discharge status: Home / Self Care | Payer: Medicare Other | Source: Ambulatory Visit | Attending: Cardiovascular Disease | Admitting: Cardiovascular Disease

## 2018-12-20 DIAGNOSIS — M79662 Pain in left lower leg: Secondary | ICD-10-CM | POA: Diagnosis not present

## 2018-12-20 DIAGNOSIS — M79669 Pain in unspecified lower leg: Secondary | ICD-10-CM | POA: Insufficient documentation

## 2018-12-20 NOTE — Telephone Encounter (Signed)
The patient has been notified of the result and verbalized understanding.  All questions (if any) were answered. Wilma Flavin, RN 12/20/2018 11:10 AM

## 2018-12-22 ENCOUNTER — Encounter: Payer: Self-pay | Admitting: Cardiology

## 2018-12-22 ENCOUNTER — Ambulatory Visit (INDEPENDENT_AMBULATORY_CARE_PROVIDER_SITE_OTHER): Payer: Medicare Other

## 2018-12-22 DIAGNOSIS — R002 Palpitations: Secondary | ICD-10-CM

## 2018-12-23 ENCOUNTER — Telehealth: Payer: Self-pay | Admitting: Internal Medicine

## 2018-12-23 NOTE — Telephone Encounter (Signed)
Cardiology Moonlighter Note  Returned page from [employer]. First documentation of atrial fibrillation. HR 120 bpm. Sustained for 1 minute then resolved. Patient now back in sinus rhythm.   Marcie Mowers, MD Cardiology Fellow, PGY-7

## 2018-12-25 ENCOUNTER — Telehealth: Payer: Self-pay

## 2018-12-25 ENCOUNTER — Other Ambulatory Visit: Payer: Self-pay

## 2018-12-25 MED ORDER — APIXABAN 5 MG PO TABS: 5.0000 mg | ORAL_TABLET | Freq: Two times a day (BID) | ORAL | 3 refills | Status: DC

## 2018-12-25 NOTE — Telephone Encounter (Signed)
Follow Up  Patient is calling back with questions about what time her should be taking his first and second dose of Eliquis. Please give patient a call back to instruct.

## 2018-12-25 NOTE — Telephone Encounter (Signed)
I spoke to the patient about his monitor strip and then to Dr Radford Pax for recommendations.  She suggested stopping ASA and starting Eliquis 5 mg bid.  We will also refer him to Afib clinic today or tomorrow.

## 2018-12-25 NOTE — Telephone Encounter (Signed)
I spoke to the patient with advisement on Eliquis.  He verbalized understanding.

## 2018-12-25 NOTE — Telephone Encounter (Signed)
Rhythm strips reviewed and PAF documented.  Please start Eliquis 5mg  BID and get into afib clinic this week.

## 2018-12-27 ENCOUNTER — Encounter (HOSPITAL_COMMUNITY): Payer: Self-pay | Admitting: Physician Assistant

## 2018-12-27 ENCOUNTER — Ambulatory Visit (HOSPITAL_COMMUNITY)
Admission: RE | Admit: 2018-12-27 | Discharge: 2018-12-27 | Disposition: A | Discharge status: Home / Self Care | Payer: Medicare Other | Source: Ambulatory Visit | Attending: Physician Assistant | Admitting: Physician Assistant

## 2018-12-27 ENCOUNTER — Other Ambulatory Visit: Payer: Self-pay

## 2018-12-27 VITALS — BP 150/92 | Ht 71.0 in | Wt 198.4 lb

## 2018-12-27 DIAGNOSIS — J449 Chronic obstructive pulmonary disease, unspecified: Secondary | ICD-10-CM | POA: Insufficient documentation

## 2018-12-27 DIAGNOSIS — Z79899 Other long term (current) drug therapy: Secondary | ICD-10-CM | POA: Insufficient documentation

## 2018-12-27 DIAGNOSIS — I480 Paroxysmal atrial fibrillation: Secondary | ICD-10-CM | POA: Insufficient documentation

## 2018-12-27 DIAGNOSIS — C9110 Chronic lymphocytic leukemia of B-cell type not having achieved remission: Secondary | ICD-10-CM | POA: Diagnosis not present

## 2018-12-27 DIAGNOSIS — E785 Hyperlipidemia, unspecified: Secondary | ICD-10-CM | POA: Diagnosis not present

## 2018-12-27 DIAGNOSIS — R0902 Hypoxemia: Secondary | ICD-10-CM | POA: Diagnosis not present

## 2018-12-27 DIAGNOSIS — F411 Generalized anxiety disorder: Secondary | ICD-10-CM | POA: Diagnosis not present

## 2018-12-27 DIAGNOSIS — E039 Hypothyroidism, unspecified: Secondary | ICD-10-CM | POA: Insufficient documentation

## 2018-12-27 DIAGNOSIS — K219 Gastro-esophageal reflux disease without esophagitis: Secondary | ICD-10-CM | POA: Insufficient documentation

## 2018-12-27 DIAGNOSIS — Z79890 Hormone replacement therapy (postmenopausal): Secondary | ICD-10-CM | POA: Insufficient documentation

## 2018-12-27 DIAGNOSIS — Z7901 Long term (current) use of anticoagulants: Secondary | ICD-10-CM | POA: Insufficient documentation

## 2018-12-27 DIAGNOSIS — I10 Essential (primary) hypertension: Secondary | ICD-10-CM | POA: Insufficient documentation

## 2018-12-27 DIAGNOSIS — Z87891 Personal history of nicotine dependence: Secondary | ICD-10-CM | POA: Insufficient documentation

## 2018-12-27 NOTE — Progress Notes (Signed)
Primary Care Physician: Cassandria Anger, MD Primary Cardiologist: Dr Radford Pax Primary Electrophysiologist: none Referring Physician: Dr Earnestine Mealing is a 73 y.o. male with a history of HTN, CLL, COPD, HLD, hypothyroidism, and paroxysmal atrial fibrillation who presents for consultation in the [facility].  The patient was initially diagnosed with atrial fibrillation after presenting with symptoms of palpitations and heart racing which have been sporadic over the last year. An event monitor was placed which showed a 1 minute episode of afib with RVR. He was started on Eliquis and referred to the Afib Clinic. He denies significant alcohol use or snoring. He does note frequent nocturnal waking and hypoxia, sleep study ordered by Dr Radford Pax.  Today, he denies symptoms of chest pain, shortness of breath, orthopnea, PND, lower extremity edema, dizziness, presyncope, syncope, snoring, daytime somnolence, bleeding, or neurologic sequela. The patient is tolerating medications without difficulties and is otherwise without complaint today.    Atrial Fibrillation Risk Factors:  he does have symptoms or diagnosis of sleep apnea. Sleep study is pending. he does not have a history of rheumatic fever. he does not have a history of alcohol use.  he has a BMI of Body mass index is 27.67 kg/m.[name] Filed Weights   12/27/18 1356  Weight: 90 kg    Family History  Problem Relation Age of Onset  . COPD Mother   . Diabetes Father   . Coronary artery disease Other   . Cancer Paternal Aunt 74       breast cancer   . Colon cancer Neg Hx      Atrial Fibrillation Management history:  Previous antiarrhythmic drugs: none Previous cardioversions: none Previous ablations: none CHADS2VASC score: 2 Anticoagulation history: Eliquis   Past Medical History:  Diagnosis Date  . Alcohol abuse, daily use 08/24/2010   Stopped 2013/06/11   . Arthralgia 12/26/2013   9/15 Not  related to statins OA   . Cataract   . 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 CLL (chronic lymphocytic leukemia) ([place]) 08/08/2014   2014-06-12 Dr Burr Medico Stage 0  . CONJUNCTIVITIS, BACTERIAL, ACUTE 02/22/2009   Qualifier: Diagnosis of  By: Diona Browner MD, Amy    . COPD (chronic obstructive pulmonary disease) (HCC)    sats 93-97 % per pt.  [name] COPD mixed type ([place]) 07/26/2007   Smoker - stopped 6/14   . De Quervain's tenosynovitis, right 12/26/13   Jun 11, 2013   . DOE (dyspnea on exertion) 03/26/2014   1/16 deconditioning, COPD vs other   . Dysuria Dec 26, 2013   9/15 - poss stricture Urol ref was offered   . Gallstones 11/16/2017   Asymptomatic Pt refused surg ref  . Generalized anxiety disorder 09/07/2012   Chronic   Potential benefits of a long term steroid  use as well as potential risks  and complications were explained to the patient and were aknowledged.     [name] GERD 12/02/2006   Chronic     . GERD (gastroesophageal reflux disease)   . Grief 05/27/16   [name] died in 12-Jun-2014  . Gynecomastia 12/26/2013   Benign B 06/11/2013   . Hyperlipidemia   . Hypertension   . Hypothyroidism 12/25/2014   06/12/2014 On Levothyroxine   . Intertrigo 02/02/2012   11/13   . Irregular heart beat    benign per pt.  . Left hand pain 05/31/2017  . Neoplasm of uncertain behavior of skin 03/12/2013   12/14  R ear, chest   . Night sweats 03/12/2013   12/14   . Pain of cheek 05/31/2017  . Palpitations 03/27/2013   Labs Atenolol prn  . Paresis ([place])    right- s/p cerv decompression  . PERIORBITAL CELLULITIS 02/22/2009   Qualifier: Diagnosis of  By: Diona Browner MD, Amy    . 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 Shoulder pain 05/31/2017  . Thyroid disease   . Weight gain 05/23/2018   Try fasting Sharman Cheek "The Obesity Code"   Past Surgical History:  Procedure Laterality Date  . BICEPS TENDON REPAIR    . CATARACT EXTRACTION Left   . COLONOSCOPY  04-14-99   Dr Flossie Dibble polyp-TA in epic  . POLYPECTOMY  04-14-99  .  POSTERIOR LAMINECTOMY / DECOMPRESSION CERVICAL SPINE     Dr Saintclair Halsted  . RETINAL DETACHMENT SURGERY     left eye, 2007 x2, 2008 x 3  . ROTATOR CUFF REPAIR  2004   right  . TONSILLECTOMY  [id number]    Current Outpatient Medications  Medication Sig Dispense Refill  . amLODipine (NORVASC) 5 MG tablet Take 1 tablet (5 mg total) by mouth daily. 90 tablet 3  . apixaban (ELIQUIS) 5 MG TABS tablet Take 1 tablet (5 mg total) by mouth 2 (two) times daily. 180 tablet 3  . atenolol (TENORMIN) 50 MG tablet Take 1 tablet (50 mg total) by mouth daily as needed. For palpitations 30 tablet 3  . Cholecalciferol (EQL VITAMIN D3) 1000 UNITS tablet Take 1,000 Units by mouth daily.      . diazepam (VALIUM) 5 MG tablet Take 1 tablet (5 mg total) by mouth every 12 (twelve) hours. 180 tablet 1  . levothyroxine (SYNTHROID, LEVOTHROID) 50 MCG tablet TAKE 1 TABLET BY MOUTH EVERY DAY 90 tablet 1  . lovastatin (MEVACOR) 20 MG tablet Take 1 tablet (20 mg total) by mouth at bedtime. 90 tablet 3  . Multiple Vitamin (MULTIVITAMIN) tablet Take 1 tablet by mouth daily. Centrum Silver.     No current facility-administered medications for this encounter.     No Known Allergies  Social History   Socioeconomic History  . Marital status: Widowed    Spouse name: Not on file  . Number of children: 1  . Years of education: Not on file  . Highest education level: Not on file  Occupational History  . Occupation: retired  Scientific laboratory technician  . Financial resource strain: Not hard at all  . Food insecurity    Worry: Never true    Inability: Never true  . Transportation needs    Medical: No    Non-medical: No  Tobacco Use  . Smoking status: Former Smoker    Packs/day: 0.80    Years: 50.00    Pack years: 40.00    Types: Cigarettes    Quit date: 08/20/2012    Years since quitting: 6.3  . Smokeless tobacco: Never Used  Substance and Sexual Activity  . Alcohol use: Not Currently    Alcohol/week: 28.0 standard drinks    Types:  28 Cans of beer per week    Comment: he used to drink moderate to heayly for 45 years, quit one year ago   . Drug use: No  . Sexual activity: Not Currently  Lifestyle  . Physical activity    Days per week: 3 days    Minutes per session: 40 min  . Stress: Not at all  Relationships  . Social Herbalist on phone: More  than three times a week    Gets together: More than three times a week    Attends religious service: 1 to 4 times per year    Active member of club or organization: Not on file    Attends meetings of clubs or organizations: More than 4 times per year    Relationship status: Widowed  . Intimate partner violence    Fear of current or ex partner: Not on file    Emotionally abused: Not on file    Physically abused: Not on file    Forced sexual activity: Not on file  Other Topics Concern  . Not on file  Social History Narrative  . Not on file     ROS- All systems are reviewed and negative except as per the HPI above.  Physical Exam: Vitals:   12/27/18 1356  BP: (!) 150/92  Weight: 90 kg  Height: 5\' 11"  (1.803 m)    GEN- The patient is well appearing, alert and oriented x 3 today.   Head- normocephalic, atraumatic Eyes-  Sclera clear, conjunctiva pink Ears- hearing intact Oropharynx- clear Neck- supple  Lungs- Clear to ausculation bilaterally, normal work of breathing Heart- Regular rate and rhythm, no murmurs, rubs or gallops  GI- soft, NT, ND, + BS Extremities- no clubbing, cyanosis, or edema MS- no significant deformity or atrophy Skin- no rash or lesion Psych- euthymic mood, full affect Neuro- strength and sensation are intact  Wt Readings from Last 3 Encounters:  12/27/18 90 kg  12/13/18 90.6 kg  11/23/18 88.9 kg    EKG is not ordered today. Patient deferred.  Epic records are reviewed at length today  Assessment and Plan:  1. Paroxysmal atrial fibrillation General education about afib provided and questions answered.  We also  discussed his stroke risk and the risks and benefits of anticoagulation. Check echocardiogram once event monitor complete. Continue Eliquis 5 mg BID. Recheck CBC at f/u. Continue atenolol 50 mg daily PRN for heart racing. We discussed possibility of daily rate control medications or AAD. Will wait to see arrhythmia burden on monitor.   This patients CHA2DS2-VASc Score and unadjusted Ischemic Stroke Rate (% per year) is equal to 2.2 % stroke rate/year from a score of 2  Above score calculated as 1 point each if present [CHF, HTN, DM, Vascular=MI/PAD/Aortic Plaque, Age if 65-74, or Male] Above score calculated as 2 points each if present [Age > 75, or Stroke/TIA/TE]   2. HTN Elevated today, has been well controlled at previous visits and at home. No changes today.  3. Frequent nocturnal waking/hypoxia The importance of adequate treatment of sleep apnea was discussed today in order to improve our ability to maintain sinus rhythm long term. Sleep study pending per Dr Radford Pax.   Follow up in the AF clinic in 6 weeks.   [facility] [address] [place], [place] [address] [phone] 12/27/2018 1:59 PM

## 2019-01-03 NOTE — Progress Notes (Addendum)
Subjective:   Joe [name] is a 73 y.o. male who presents for Medicare Annual/Subsequent preventive examination. I connected with patient by a telephone and verified that I am speaking with the correct person using two identifiers. Patient stated full name and DOB. Patient gave permission to continue with telephonic visit. Patient's location was at home and Nurse's location was at [place] office.   Review of Systems:     Sleep patterns: feels rested on waking, gets up 0-1 times nightly to void and sleeps 6-8 hours nightly.    Home Safety/Smoke Alarms: Feels safe in home. Smoke alarms in place.  Living environment; residence and Firearm Safety: 1-story house/ trailer. Lives alone, no needs for DME, good support system Seat Belt Safety/Bike Helmet: Wears seat belt.     Objective:    Vitals: There were no vitals taken for this visit.  There is no height or weight on file to calculate BMI.  Advanced Directives 11/16/2017 06/16/2017 09/30/2016 04/01/2016 07/24/2015 06/26/2015 08/29/2014  Does Patient Have a Medical Advance Directive? Yes No No No No (No Data) No  Type of Paramedic of [place];Living will - - - - - -  Copy of [place] in Chart? No - copy requested - - - - - -  Would patient like information on creating a medical advance directive? - - - No - Patient declined No - patient declined information - -    Tobacco Social History   Tobacco Use  Smoking Status Former Smoker  . Packs/day: 0.80  . Years: 50.00  . Pack years: 40.00  . Types: Cigarettes  . Quit date: 08/20/2012  . Years since quitting: 6.3  Smokeless Tobacco Never Used     Counseling given: Not Answered  Past Medical History:  Diagnosis Date  . Alcohol abuse, daily use 08/24/2010   Stopped 06-13-2013   . Arthralgia 12-28-2013   9/15 Not related to statins OA   . Cataract   . 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 CLL (chronic  lymphocytic leukemia) ([place]) 08/08/2014   2014-06-14 Dr Burr Medico Stage 0  . CONJUNCTIVITIS, BACTERIAL, ACUTE 02/22/2009   Qualifier: Diagnosis of  By: Diona Browner MD, Amy    . COPD (chronic obstructive pulmonary disease) (HCC)    sats 93-97 % per pt.  [name] COPD mixed type ([place]) 07/26/2007   Smoker - stopped 6/14   . De Quervain's tenosynovitis, right 2013-12-28   06/13/2013   . DOE (dyspnea on exertion) 03/26/2014   1/16 deconditioning, COPD vs other   . Dysuria Dec 28, 2013   9/15 - poss stricture Urol ref was offered   . Gallstones 11/16/2017   Asymptomatic Pt refused surg ref  . Generalized anxiety disorder 09/07/2012   Chronic   Potential benefits of a long term steroid  use as well as potential risks  and complications were explained to the patient and were aknowledged.     [name] GERD 12/02/2006   Chronic     . GERD (gastroesophageal reflux disease)   . Grief 29-May-2016   [name] died in 06-14-2014  . Gynecomastia 28-Dec-2013   Benign B 06-13-2013   . Hyperlipidemia   . Hypertension   . Hypothyroidism 12/25/2014   2014-06-14 On Levothyroxine   . Intertrigo 02/02/2012   11/13   . Irregular heart beat    benign per pt.  . Left hand pain 05/31/2017  . Neoplasm of uncertain behavior of skin 03/12/2013   12/14  R ear, chest   . Night sweats 03/12/2013   12/14   . Pain of cheek 05/31/2017  . Palpitations 03/27/2013   Labs Atenolol prn  . 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 PERIORBITAL CELLULITIS 02/22/2009   Qualifier: Diagnosis of  By: Diona Browner MD, Amy    . 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 Shoulder pain 05/31/2017  . Thyroid disease   . Weight gain 05/23/2018   Try fasting Sharman Cheek "The Obesity Code"   Past Surgical History:  Procedure Laterality Date  . BICEPS TENDON REPAIR    . CATARACT EXTRACTION Left   . COLONOSCOPY  04-14-99   Dr Flossie Dibble polyp-TA in epic  . POLYPECTOMY  04-14-99  . POSTERIOR LAMINECTOMY / DECOMPRESSION CERVICAL SPINE     Dr Saintclair Halsted  . RETINAL DETACHMENT SURGERY     left eye, 2007 x2, 2008 x 3  .  ROTATOR CUFF REPAIR  2004   right  . TONSILLECTOMY  [id number]   Family History  Problem Relation Age of Onset  . COPD Mother   . Diabetes Father   . Coronary artery disease Other   . Cancer Paternal Aunt 60       breast cancer   . Colon cancer Neg Hx    Social History   Socioeconomic History  . Marital status: Widowed    Spouse name: Not on file  . Number of children: 1  . Years of education: Not on file  . Highest education level: Not on file  Occupational History  . Occupation: retired  Scientific laboratory technician  . Financial resource strain: Not hard at all  . Food insecurity    Worry: Never true    Inability: Never true  . Transportation needs    Medical: No    Non-medical: No  Tobacco Use  . Smoking status: Former Smoker    Packs/day: 0.80    Years: 50.00    Pack years: 40.00    Types: Cigarettes    Quit date: 08/20/2012    Years since quitting: 6.3  . Smokeless tobacco: Never Used  Substance and Sexual Activity  . 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 Drug use: No  . Sexual activity: Not Currently  Lifestyle  . Physical activity    Days per week: 3 days    Minutes per session: 40 min  . Stress: Not at all  Relationships  . Social connections    Talks on phone: More than three times a week    Gets together: More than three times a week    Attends religious service: 1 to 4 times per year    Active member of club or organization: Not on file    Attends meetings of clubs or organizations: More than 4 times per year    Relationship status: Widowed  Other Topics Concern  . Not on file  Social History Narrative  . Not on file    Outpatient Encounter Medications as of 01/04/2019  Medication Sig  . amLODipine (NORVASC) 5 MG tablet Take 1 tablet (5 mg total) by mouth daily.  [name] apixaban (ELIQUIS) 5 MG TABS tablet Take 1  tablet (5 mg total) by mouth 2 (two) times daily.  [name] atenolol (TENORMIN) 50 MG tablet Take 1 tablet (  50 mg total) by mouth daily as needed. For palpitations  . Cholecalciferol (EQL VITAMIN D3) 1000 UNITS tablet Take 1,000 Units by mouth daily.    . diazepam (VALIUM) 5 MG tablet Take 1 tablet (5 mg total) by mouth every 12 (twelve) hours.  [name] levothyroxine (SYNTHROID, LEVOTHROID) 50 MCG tablet TAKE 1 TABLET BY MOUTH EVERY DAY  . lovastatin (MEVACOR) 20 MG tablet Take 1 tablet (20 mg total) by mouth at bedtime.  . Multiple Vitamin (MULTIVITAMIN) tablet Take 1 tablet by mouth daily. Centrum Silver.   No facility-administered encounter medications on file as of 01/04/2019.     Activities of Daily Living No flowsheet data found.  Patient Care Team: [name], [name], MD as PCP - General Truitt Merle, MD as Consulting Physician (Hematology) Sueanne Margarita, MD as Consulting Physician (Cardiology) Hayden Pedro, MD as Consulting Physician (Ophthalmology)   Assessment:   This is a routine wellness examination for Jeramia. Physical assessment deferred to PCP.   Exercise Activities and Dietary recommendations   Diet (meal preparation, eat out, water intake, caffeinated beverages, dairy products, fruits and vegetables): in general, a "healthy" diet     Reviewed heart healthy and diabetic diet. Encouraged patient to increase daily water and healthy fluid intake.  Goals    . Exercise 150 minutes per week (moderate activity)     Will start doing stretches every day at some point  Will spend time outdoors; likes to walk;  [place]; Trails for walking; 4 mile/ set small goals on small trails and build endurance     . Patient Stated     I want to get motivated and get my house clean and become more active in [employer].       Fall Risk Fall Risk  11/16/2017 11/11/2016 06/26/2015 06/25/2014  Falls in the past year? No No No No   Is the patient's home free of loose throw rugs in  walkways, pet beds, electrical cords, etc?   yes      Grab bars in the bathroom? yes      Handrails on the stairs?   yes      Adequate lighting?   yes  Depression Screen PHQ 2/9 Scores 11/16/2017 11/11/2016 06/25/2014  PHQ - 2 Score 1 0 0    Cognitive Function MMSE - Mini Mental State Exam 06/26/2015  Not completed: (No Data)       Ad8 score reviewed for issues:  Issues making decisions: no  Less interest in hobbies / activities: no  Repeats questions, stories (family complaining): no  Trouble using ordinary gadgets (microwave, computer, phone):no  Forgets the month or year: no  Mismanaging finances: no  Remembering appts: no  Daily problems with thinking and/or memory: no Ad8 score is= 0  Immunization History  Administered Date(s) Administered  . Fluad Quad(high Dose 65+) 11/23/2018  . Influenza Split 02/02/2012  . Influenza Whole 12/23/2005  . Influenza, High Dose Seasonal PF 12/11/2015, 12/23/2016, 12/29/2017  . Influenza,inj,Quad PF,6+ Mos 12/13/2012, 12/13/2013, 12/25/2014  . Pneumococcal Conjugate-13 03/12/2013  . Pneumococcal Polysaccharide-23 06/12/2013  . Td 05/14/2009   Screening Tests Health Maintenance  Topic Date Due  . TETANUS/TDAP  05/15/2019  . COLONOSCOPY  09/12/2019  . INFLUENZA VACCINE  Completed  . Hepatitis C Screening  Completed  . PNA vac Low Risk Adult  Completed      Plan:    Reviewed health maintenance screenings with patient today and relevant education, vaccines, and/or referrals were provided.  I have personally reviewed and noted the following in the patient's chart:   . Medical and social history . Use of alcohol, tobacco or illicit drugs  . Current medications and supplements . Functional ability and status . Nutritional status . Physical activity . Advanced directives . List of other physicians . Screenings to include cognitive, depression, and falls . Referrals and appointments  In addition, I have reviewed and  discussed with patient certain preventive protocols, quality metrics, and best practice recommendations. A written personalized care plan for preventive services as well as general preventive health recommendations were provided to patient.     Michiel Cowboy, RN  01/04/2019  Medical screening examination/treatment/procedure(s) were performed by non-physician practitioner and as supervising physician I was immediately available for consultation/collaboration. I agree with above. Lew Dawes, MD

## 2019-01-04 ENCOUNTER — Ambulatory Visit (INDEPENDENT_AMBULATORY_CARE_PROVIDER_SITE_OTHER): Payer: Medicare Other | Admitting: *Deleted

## 2019-01-04 DIAGNOSIS — Z0000 Encounter for general adult medical examination without abnormal findings: Secondary | ICD-10-CM | POA: Diagnosis not present

## 2019-01-10 NOTE — Telephone Encounter (Signed)
Late Entry: Notification came from [facility] that patient currently has a heart monitor on until the end of November and asked [facility] to call back then to complete his HST.

## 2019-01-29 ENCOUNTER — Other Ambulatory Visit: Payer: Self-pay | Admitting: Internal Medicine

## 2019-01-29 ENCOUNTER — Telehealth: Payer: Self-pay | Admitting: *Deleted

## 2019-01-29 NOTE — Telephone Encounter (Signed)
Patient called to say he has recieved the watch-pat-1 but he is not able to use it because he needs a smart phone to operate it. [facility] is sending him another sleep study called watch-pat 300 that he should be able to complete and send back.

## 2019-01-29 NOTE — Telephone Encounter (Signed)
Please contact Mr. Taite regarding his sleep study.

## 2019-01-29 NOTE — Telephone Encounter (Signed)
Returned call: Patient called to say he has recieved the watch-pat-1 but he is not able to use it because he needs a smart phone to operate it. [facility] is sending him another sleep study called watch-pat 300 that he should be able to complete and send back. Pt is aware and agreeable to treatment.

## 2019-02-03 ENCOUNTER — Encounter (INDEPENDENT_AMBULATORY_CARE_PROVIDER_SITE_OTHER): Payer: Medicare Other | Admitting: Cardiology

## 2019-02-03 DIAGNOSIS — G4733 Obstructive sleep apnea (adult) (pediatric): Secondary | ICD-10-CM

## 2019-02-03 DIAGNOSIS — E161 Other hypoglycemia: Secondary | ICD-10-CM | POA: Diagnosis not present

## 2019-02-05 ENCOUNTER — Telehealth: Payer: Self-pay | Admitting: Internal Medicine

## 2019-02-05 NOTE — Telephone Encounter (Signed)
Pt states that Dr. Radford Pax has diagnosed him with A.Fib and he has been seen by the A.Fib clinic.  Pt states that the A.Fib clinic has told him to contact his PCP and update him with the changes they have made to his medication and make sure that PCP is okay with all these changes. Pt would like to speak with Dr. Judeen Hammans assistant regarding this.

## 2019-02-06 ENCOUNTER — Encounter (HOSPITAL_COMMUNITY): Payer: Self-pay | Admitting: Physician Assistant

## 2019-02-06 ENCOUNTER — Other Ambulatory Visit: Payer: Self-pay

## 2019-02-06 ENCOUNTER — Ambulatory Visit (HOSPITAL_COMMUNITY)
Admission: RE | Admit: 2019-02-06 | Discharge: 2019-02-06 | Disposition: A | Discharge status: Home / Self Care | Payer: Medicare Other | Source: Ambulatory Visit | Attending: Physician Assistant | Admitting: Physician Assistant

## 2019-02-06 ENCOUNTER — Telehealth (HOSPITAL_COMMUNITY): Payer: Self-pay | Admitting: *Deleted

## 2019-02-06 VITALS — BP 132/76 | HR 80 | Ht 71.0 in | Wt 201.8 lb

## 2019-02-06 DIAGNOSIS — E039 Hypothyroidism, unspecified: Secondary | ICD-10-CM | POA: Insufficient documentation

## 2019-02-06 DIAGNOSIS — I10 Essential (primary) hypertension: Secondary | ICD-10-CM | POA: Insufficient documentation

## 2019-02-06 DIAGNOSIS — Z79899 Other long term (current) drug therapy: Secondary | ICD-10-CM | POA: Diagnosis not present

## 2019-02-06 DIAGNOSIS — K219 Gastro-esophageal reflux disease without esophagitis: Secondary | ICD-10-CM | POA: Diagnosis not present

## 2019-02-06 DIAGNOSIS — J984 Other disorders of lung: Secondary | ICD-10-CM | POA: Insufficient documentation

## 2019-02-06 DIAGNOSIS — J449 Chronic obstructive pulmonary disease, unspecified: Secondary | ICD-10-CM | POA: Diagnosis not present

## 2019-02-06 DIAGNOSIS — Z87891 Personal history of nicotine dependence: Secondary | ICD-10-CM | POA: Diagnosis not present

## 2019-02-06 DIAGNOSIS — F411 Generalized anxiety disorder: Secondary | ICD-10-CM | POA: Diagnosis not present

## 2019-02-06 DIAGNOSIS — C9110 Chronic lymphocytic leukemia of B-cell type not having achieved remission: Secondary | ICD-10-CM | POA: Diagnosis not present

## 2019-02-06 DIAGNOSIS — D6869 Other thrombophilia: Secondary | ICD-10-CM | POA: Insufficient documentation

## 2019-02-06 DIAGNOSIS — Z79890 Hormone replacement therapy (postmenopausal): Secondary | ICD-10-CM | POA: Diagnosis not present

## 2019-02-06 DIAGNOSIS — E785 Hyperlipidemia, unspecified: Secondary | ICD-10-CM | POA: Diagnosis not present

## 2019-02-06 DIAGNOSIS — I480 Paroxysmal atrial fibrillation: Secondary | ICD-10-CM | POA: Diagnosis not present

## 2019-02-06 DIAGNOSIS — Z7901 Long term (current) use of anticoagulants: Secondary | ICD-10-CM | POA: Insufficient documentation

## 2019-02-06 DIAGNOSIS — R0902 Hypoxemia: Secondary | ICD-10-CM | POA: Diagnosis not present

## 2019-02-06 DIAGNOSIS — I4510 Unspecified right bundle-branch block: Secondary | ICD-10-CM | POA: Insufficient documentation

## 2019-02-06 LAB — BASIC METABOLIC PANEL
Anion gap: 9 (ref 5–15)
BUN: 6 mg/dL — ABNORMAL LOW (ref 8–23)
CO2: 28 mmol/L (ref 22–32)
Calcium: 9.6 mg/dL (ref 8.9–10.3)
Chloride: 101 mmol/L (ref 98–111)
Creatinine, Ser: 0.67 mg/dL (ref 0.61–1.24)
GFR calc Af Amer: >60 mL/min (ref >60)
GFR calc non Af Amer: >60 mL/min (ref >60)
Glucose, Bld: 87 mg/dL (ref 70–99)
Potassium: 5 mmol/L (ref 3.5–5.1)
Sodium: 138 mmol/L (ref 135–145)

## 2019-02-06 LAB — CBC
HCT: 53.2 % — ABNORMAL HIGH (ref 39.0–52.0)
Hemoglobin: 17.2 g/dL — ABNORMAL HIGH (ref 13.0–17.0)
MCH: 30.7 pg (ref 26.0–34.0)
MCHC: 32.3 g/dL (ref 30.0–36.0)
MCV: 95 fL (ref 80.0–100.0)
Platelets: 316 10*3/uL (ref 150–400)
RBC: 5.6 MIL/uL (ref 4.22–5.81)
RDW: 12.9 % (ref 11.5–15.5)
WBC: 16.2 10*3/uL — ABNORMAL HIGH (ref 4.0–10.5)
nRBC: 0 % (ref 0.0–0.2)

## 2019-02-06 MED ORDER — ATENOLOL 50 MG PO TABS: 25.0000 mg | ORAL_TABLET | Freq: Every day | ORAL | 3 refills | Status: DC | PRN

## 2019-02-06 NOTE — Telephone Encounter (Signed)
error 

## 2019-02-06 NOTE — Progress Notes (Signed)
Primary Care Physician: Cassandria Anger, MD Primary Cardiologist: Dr Radford Pax Primary Electrophysiologist: none Referring Physician: Dr Earnestine Mealing is a 73 y.o. male with a history of HTN, CLL, COPD, HLD, hypothyroidism, and paroxysmal atrial fibrillation who presents for consultation in the [facility].  The patient was initially diagnosed with atrial fibrillation after presenting with symptoms of palpitations and heart racing which have been sporadic over the last year. An event monitor was placed which showed a 1 minute episode of afib with RVR. He was started on Eliquis and referred to the Afib Clinic. He denies significant alcohol use or snoring. He does note frequent nocturnal waking and hypoxia, sleep study ordered by Dr Radford Pax.  On follow up today, patient's event monitor showed 2% afib burden with the majority of episodes being rate controlled. Patient reports that overall he has done well since his last visit. His symptoms of palpitations seem to correspond with his arrhythmias and PACs. He just completed a home sleep study and results are pending.  Today, he denies symptoms of chest pain, shortness of breath, orthopnea, PND, lower extremity edema, dizziness, presyncope, syncope, snoring, daytime somnolence, bleeding, or neurologic sequela. The patient is tolerating medications without difficulties and is otherwise without complaint today.    Atrial Fibrillation Risk Factors:  he does have symptoms or diagnosis of sleep apnea. Sleep study is pending. he does not have a history of rheumatic fever. he does not have a history of alcohol use.  he has a BMI of Body mass index is 28.15 kg/m.[name] Filed Weights   02/06/19 1335  Weight: 91.5 kg    Family History  Problem Relation Age of Onset  . COPD Mother   . Diabetes Father   . Coronary artery disease Other   . Cancer Paternal Aunt 8       breast cancer   . Colon cancer Neg Hx      Atrial Fibrillation Management history:  Previous antiarrhythmic drugs: none Previous cardioversions: none Previous ablations: none CHADS2VASC score: 2 Anticoagulation history: Eliquis   Past Medical History:  Diagnosis Date  . Alcohol abuse, daily use 08/24/2010   Stopped 2013-06-20   . Arthralgia 01-04-14   9/15 Not related to statins OA   . Cataract   . 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 CLL (chronic lymphocytic leukemia) ([place]) 08/08/2014   21-Jun-2014 Dr Burr Medico Stage 0  . CONJUNCTIVITIS, BACTERIAL, ACUTE 02/22/2009   Qualifier: Diagnosis of  By: Diona Browner MD, Amy    . COPD (chronic obstructive pulmonary disease) (HCC)    sats 93-97 % per pt.  [name] COPD mixed type ([place]) 07/26/2007   Smoker - stopped 6/14   . De Quervain's tenosynovitis, right 01/04/2014   Jun 20, 2013   . DOE (dyspnea on exertion) 03/26/2014   1/16 deconditioning, COPD vs other   . Dysuria 01/04/14   9/15 - poss stricture Urol ref was offered   . Gallstones 11/16/2017   Asymptomatic Pt refused surg ref  . Generalized anxiety disorder 09/07/2012   Chronic   Potential benefits of a long term steroid  use as well as potential risks  and complications were explained to the patient and were aknowledged.     [name] GERD 12/02/2006   Chronic     . GERD (gastroesophageal reflux disease)   . Grief 06/05/16   [name] died in Jun 21, 2014  . Gynecomastia 01/04/2014   Benign B 06/20/13   .  Hyperlipidemia   . Hypertension   . Hypothyroidism 12/25/2014   2016 On Levothyroxine   . Intertrigo 02/02/2012   11/13   . Irregular heart beat    benign per pt.  . Left hand pain 05/31/2017  . Neoplasm of uncertain behavior of skin 03/12/2013   12/14 R ear, chest   . Night sweats 03/12/2013   12/14   . Pain of cheek 05/31/2017  . Palpitations 03/27/2013   Labs Atenolol prn  . Paresis ([place])    right- s/p cerv decompression  . PERIORBITAL CELLULITIS 02/22/2009   Qualifier: Diagnosis of  By: Diona Browner MD, Amy    . 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 Shoulder pain 05/31/2017  . Thyroid disease   . Weight gain 05/23/2018   Try fasting Sharman Cheek "The Obesity Code"   Past Surgical History:  Procedure Laterality Date  . BICEPS TENDON REPAIR    . CATARACT EXTRACTION Left   . COLONOSCOPY  04-14-99   Dr Flossie Dibble polyp-TA in epic  . POLYPECTOMY  04-14-99  . POSTERIOR LAMINECTOMY / DECOMPRESSION CERVICAL SPINE     Dr Saintclair Halsted  . RETINAL DETACHMENT SURGERY     left eye, 2007 x2, 2008 x 3  . ROTATOR CUFF REPAIR  2004   right  . TONSILLECTOMY  [id number]    Current Outpatient Medications  Medication Sig Dispense Refill  . amLODipine (NORVASC) 5 MG tablet TAKE 1 TABLET BY MOUTH EVERY DAY 90 tablet 3  . apixaban (ELIQUIS) 5 MG TABS tablet Take 1 tablet (5 mg total) by mouth 2 (two) times daily. 180 tablet 3  . Cholecalciferol (EQL VITAMIN D3) 1000 UNITS tablet Take 1,000 Units by mouth daily.      . diazepam (VALIUM) 5 MG tablet Take 1 tablet (5 mg total) by mouth every 12 (twelve) hours. 180 tablet 1  . levothyroxine (SYNTHROID) 50 MCG tablet TAKE 1 TABLET BY MOUTH EVERY DAY 90 tablet 3  . lovastatin (MEVACOR) 20 MG tablet TAKE 1 TABLET BY MOUTH EVERY NIGHT AT BEDTIME 90 tablet 3  . Multiple Vitamin (MULTIVITAMIN) tablet Take 1 tablet by mouth daily. Centrum Silver.    [name] atenolol (TENORMIN) 50 MG tablet Take 0.5 tablets (25 mg total) by mouth daily as needed. For palpitations 30 tablet 3   No current facility-administered medications for this encounter.     No Known Allergies  Social History   Socioeconomic History  . Marital status: Widowed    Spouse name: Not on file  . Number of children: 1  . Years of education: Not on file  . Highest education level: Not on file  Occupational History  . Occupation: retired  Scientific laboratory technician  . Financial resource strain: Not hard at all  . Food insecurity    Worry: Never true    Inability: Never true  . Transportation needs    Medical: No    Non-medical: No  Tobacco Use   . Smoking status: Former Smoker    Packs/day: 0.80    Years: 50.00    Pack years: 40.00    Types: Cigarettes    Quit date: 08/20/2012    Years since quitting: 6.4  . Smokeless tobacco: Never Used  Substance and Sexual Activity  . 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 Drug  use: No  . Sexual activity: Not Currently  Lifestyle  . Physical activity    Days per week: 0 days    Minutes per session: 0 min  . Stress: Not at all  Relationships  . Social connections    Talks on phone: More than three times a week    Gets together: More than three times a week    Attends religious service: 1 to 4 times per year    Active member of club or organization: Yes    Attends meetings of clubs or organizations: More than 4 times per year    Relationship status: Widowed  . Intimate partner violence    Fear of current or ex partner: Not on file    Emotionally abused: Not on file    Physically abused: Not on file    Forced sexual activity: Not on file  Other Topics Concern  . Not on file  Social History Narrative  . Not on file     ROS- All systems are reviewed and negative except as per the HPI above.  Physical Exam: Vitals:   02/06/19 1335  BP: 132/76  Pulse: 80  Weight: 91.5 kg  Height: 5\' 11"  (1.803 m)    GEN- The patient is well appearing, alert and oriented x 3 today.   HEENT-head normocephalic, atraumatic, sclera clear, conjunctiva pink, hearing intact, trachea midline. Lungs- Clear to ausculation bilaterally, normal work of breathing, diminished breath sounds Heart- Regular rate and rhythm, no murmurs, rubs or gallops  GI- soft, NT, ND, + BS Extremities- no clubbing, cyanosis, or edema MS- no significant deformity or atrophy Skin- no rash or lesion Psych- euthymic mood, full affect Neuro- strength and sensation are intact   Wt  Readings from Last 3 Encounters:  02/06/19 91.5 kg  12/27/18 90 kg  12/13/18 90.6 kg    EKG from today demonstrates SR HR 80, inc RBBB, PR 182, QRS 106, QTc 438  Epic records are reviewed at length today  Assessment and Plan:  1. Paroxysmal atrial fibrillation 2% burden noted on event monitor. Majority of afib was rate controlled. We discussed therapeutic options today including AAD therapy. For now, patient would like to continue PRN atenolol with no addition of AAD. Will revisit if his symptoms become more persistent.  Check echocardiogram Continue Eliquis 5 mg BID.  Check CBC/Bmet Will decrease atenolol to 25 mg daily PRN for heart racing as he does become bradycardic after taking a dose.  This patients CHA2DS2-VASc Score and unadjusted Ischemic Stroke Rate (% per year) is equal to 2.2 % stroke rate/year from a score of 2  Above score calculated as 1 point each if present [CHF, HTN, DM, Vascular=MI/PAD/Aortic Plaque, Age if 65-74, or Male] Above score calculated as 2 points each if present [Age > 75, or Stroke/TIA/TE]   2. HTN Stable, no changes today.  3. Frequent nocturnal waking/hypoxia The importance of adequate treatment of sleep apnea was discussed today in order to improve our ability to maintain sinus rhythm long term. Sleep study results pending.   Follow up in the AF clinic in 3 months.   [facility] [address] [place], [place] [id number] [phone] 02/06/2019 2:19 PM

## 2019-02-06 NOTE — Patient Instructions (Signed)
Decrease Atenolol 25mg  as needed

## 2019-02-07 NOTE — Telephone Encounter (Signed)
Pt gave update on new medications

## 2019-02-09 ENCOUNTER — Encounter (INDEPENDENT_AMBULATORY_CARE_PROVIDER_SITE_OTHER): Payer: Medicare Other | Admitting: Ophthalmology

## 2019-02-09 DIAGNOSIS — H353122 Nonexudative age-related macular degeneration, left eye, intermediate dry stage: Secondary | ICD-10-CM | POA: Diagnosis not present

## 2019-02-09 DIAGNOSIS — H35371 Puckering of macula, right eye: Secondary | ICD-10-CM | POA: Diagnosis not present

## 2019-02-09 DIAGNOSIS — H33301 Unspecified retinal break, right eye: Secondary | ICD-10-CM

## 2019-02-09 DIAGNOSIS — H338 Other retinal detachments: Secondary | ICD-10-CM | POA: Diagnosis not present

## 2019-02-09 DIAGNOSIS — H26491 Other secondary cataract, right eye: Secondary | ICD-10-CM | POA: Diagnosis not present

## 2019-02-09 DIAGNOSIS — I10 Essential (primary) hypertension: Secondary | ICD-10-CM

## 2019-02-09 DIAGNOSIS — H34831 Tributary (branch) retinal vein occlusion, right eye, with macular edema: Secondary | ICD-10-CM

## 2019-02-09 DIAGNOSIS — H35033 Hypertensive retinopathy, bilateral: Secondary | ICD-10-CM | POA: Diagnosis not present

## 2019-02-09 DIAGNOSIS — H43813 Vitreous degeneration, bilateral: Secondary | ICD-10-CM

## 2019-02-13 ENCOUNTER — Other Ambulatory Visit: Payer: Self-pay

## 2019-02-13 ENCOUNTER — Ambulatory Visit (HOSPITAL_COMMUNITY)
Admission: RE | Admit: 2019-02-13 | Discharge: 2019-02-13 | Disposition: A | Discharge status: Home / Self Care | Payer: Medicare Other | Source: Ambulatory Visit | Attending: Cardiology | Admitting: Cardiology

## 2019-02-13 DIAGNOSIS — Z87891 Personal history of nicotine dependence: Secondary | ICD-10-CM | POA: Diagnosis not present

## 2019-02-13 DIAGNOSIS — I119 Hypertensive heart disease without heart failure: Secondary | ICD-10-CM | POA: Insufficient documentation

## 2019-02-13 DIAGNOSIS — I351 Nonrheumatic aortic (valve) insufficiency: Secondary | ICD-10-CM | POA: Diagnosis not present

## 2019-02-13 DIAGNOSIS — J449 Chronic obstructive pulmonary disease, unspecified: Secondary | ICD-10-CM | POA: Diagnosis not present

## 2019-02-13 DIAGNOSIS — E785 Hyperlipidemia, unspecified: Secondary | ICD-10-CM | POA: Insufficient documentation

## 2019-02-13 DIAGNOSIS — I480 Paroxysmal atrial fibrillation: Secondary | ICD-10-CM | POA: Insufficient documentation

## 2019-02-13 NOTE — Progress Notes (Signed)
  Echocardiogram 2D Echocardiogram has been performed.  Joe [name] 02/13/2019, 1:53 PM

## 2019-02-20 ENCOUNTER — Encounter (HOSPITAL_COMMUNITY): Payer: Self-pay | Admitting: *Deleted

## 2019-02-21 ENCOUNTER — Telehealth: Payer: Self-pay

## 2019-02-21 NOTE — Telephone Encounter (Signed)
Notes recorded by Frederik Schmidt, RN on 02/21/2019 at 8:15 AM EST  The patient has been notified of the result and verbalized understanding. All questions (if any) were answered.  Frederik Schmidt, RN 02/21/2019 8:15 AM   ------

## 2019-02-21 NOTE — Telephone Encounter (Signed)
-----   Message from Sueanne Margarita, MD sent at 02/20/2019 10:58 PM EST ----- Please repeat 2D echo in 1 year for aortic insufficiency

## 2019-02-28 ENCOUNTER — Ambulatory Visit: Payer: Medicare Other

## 2019-02-28 NOTE — Procedures (Signed)
   Sleep Study Report  Patient Information Name: Joe [name]  ID: [id number] Birth Date: 1946/02/23  Age: 73  Gender: Male  BMI: 27.8 (W=198 lb, H=5' 11'') Study Date:02/03/2019 Referring Physician: Fransico Him, MD Sleep Study Report  TEST DESCRIPTION: Home sleep apnea testing was completed using the WatchPat, a Type 1 device, utilizing peripheral arterial tonometry (PAT), chest movement, actigraphy, pulse oximetry, pulse rate, body position and snore. AHI was calculated with apnea and hypopnea using valid sleep time as the denominator. RDI includes apneas, hypopneas, and RERAs. The data acquired and the scoring of sleep and all associated events were performed in accordance with the recommended standards and specifications as outlined in the AASM Manual for the Scoring of Sleep and Associated Events 2.2.0 (2015).  FINDINGS: 1. Mild Obstructive Sleep Apnea with AHI 9.2/hr. 2. No Central Sleep Apnea. 3. Lowest O2 sat was 75%. 4. Severe nocturnal hypoxemia with total time spent with O2 sats < 88% was 318 minutes (66% of sleep time). 5. Mild snoring was noted. 6. Total sleep time was 8 hours and 3 minutes. 7. Normal sleep onset latency at 24 minutes and REM sleep onset latency of 93 minutes. 8. Total awakenings was 7. 9. Patient spent 99.4% in the non-supine position.  DIAGNOSIS: Mild Obstructive Sleep Apnea (G47.33) Severe Nocturnal Hypoxemia  RECOMMENDATIONS: 1. Findings are consistent with mild OSA. For symptomatic mild obstructive sleep apnea, patient preference and compliance impacts efficacious outcomes. Therapeutic options include:   a. The patient may benefit from the use of a nocturnal mandibular repositioning appliance. If that line of therapy is to be pursued the patient should be evaluated by a dentist trained in the treatment of sleep related breathing disorders.   b. An ENT consultation which may be useful for specific causes of obstruction and possible treatment  options .   c. Consider treatment with nasal continuous positive airway pressure ( CPAP). If the patient chooses CPAP therapy, a nocturnal PSG with CPAP titration is recommended. As an alternative, an Auto PAP with pressure range 5-20cm H2O with download is an option.  2. Weight loss may be of benefit in reducing the severity of respiratory events and snoring .  3. Routine follow-up efficacy testing should be performed.

## 2019-03-02 ENCOUNTER — Telehealth: Payer: Self-pay | Admitting: *Deleted

## 2019-03-02 DIAGNOSIS — G4733 Obstructive sleep apnea (adult) (pediatric): Secondary | ICD-10-CM

## 2019-03-02 NOTE — Telephone Encounter (Signed)
Informed patient of sleep study results and patient understanding was verbalized. Patient understands his sleep study showed they have sleep apnea with severe nocturnal hypoxemia and recommend CPAP titration. Please set up titration in the sleep lab.  Pt is aware and agreeable to his results. Titration sent to precert

## 2019-03-02 NOTE — Telephone Encounter (Signed)
-----   Message from Sueanne Margarita, MD sent at 02/28/2019  4:32 PM EST ----- Please let patient know that they have sleep apnea with severe nocturnal hypoxemia and recommend CPAP titration. Please set up titration in the sleep lab.

## 2019-03-02 NOTE — Addendum Note (Signed)
Addended by: Freada Bergeron on: 03/02/2019 05:28 PM   Modules accepted: Orders

## 2019-03-08 ENCOUNTER — Encounter (INDEPENDENT_AMBULATORY_CARE_PROVIDER_SITE_OTHER): Payer: Medicare Other | Admitting: Ophthalmology

## 2019-03-08 ENCOUNTER — Other Ambulatory Visit: Payer: Self-pay

## 2019-03-08 DIAGNOSIS — H2703 Aphakia, bilateral: Secondary | ICD-10-CM

## 2019-03-08 NOTE — Telephone Encounter (Signed)
Patient is scheduled for CPAP Titration on 03/27/19. Pt  is scheduled for COVID screening on 03/24/19 10:15 prior to titration.  Patient understands his titration study will be done at [facility] sleep lab. Patient understands he will receive a letter in a week or so detailing appointment, date, time, and location. Patient understands to call if he does not receive the letter  in a timely manner. Patient agrees with treatment and thanked me for call.

## 2019-03-12 ENCOUNTER — Other Ambulatory Visit: Payer: Self-pay

## 2019-03-24 ENCOUNTER — Other Ambulatory Visit (HOSPITAL_COMMUNITY)
Admission: RE | Admit: 2019-03-24 | Discharge: 2019-03-24 | Disposition: A | Discharge status: Home / Self Care | Payer: Medicare Other | Source: Ambulatory Visit | Attending: Cardiology | Admitting: Cardiology

## 2019-03-24 DIAGNOSIS — Z01812 Encounter for preprocedural laboratory examination: Secondary | ICD-10-CM | POA: Diagnosis not present

## 2019-03-24 DIAGNOSIS — Z20822 Contact with and (suspected) exposure to covid-19: Secondary | ICD-10-CM | POA: Diagnosis not present

## 2019-03-24 LAB — SARS CORONAVIRUS 2 (TAT 6-24 HRS): SARS Coronavirus 2: NEGATIVE

## 2019-03-26 ENCOUNTER — Encounter (INDEPENDENT_AMBULATORY_CARE_PROVIDER_SITE_OTHER): Payer: Medicare Other | Admitting: Ophthalmology

## 2019-03-27 ENCOUNTER — Other Ambulatory Visit: Payer: Self-pay

## 2019-03-27 ENCOUNTER — Ambulatory Visit (HOSPITAL_BASED_OUTPATIENT_CLINIC_OR_DEPARTMENT_OTHER): Payer: Medicare Other | Attending: Cardiology | Admitting: Cardiology

## 2019-03-27 ENCOUNTER — Ambulatory Visit: Payer: Medicare Other | Admitting: Cardiology

## 2019-03-27 DIAGNOSIS — R0902 Hypoxemia: Secondary | ICD-10-CM | POA: Diagnosis not present

## 2019-03-27 DIAGNOSIS — G4733 Obstructive sleep apnea (adult) (pediatric): Secondary | ICD-10-CM | POA: Diagnosis not present

## 2019-03-28 NOTE — Progress Notes (Signed)
[place]   [phone] Fax:([phone]   Clinic Follow up Note   Patient Care Team: [name], [name], MD as PCP - General Truitt Merle, MD as Consulting Physician (Hematology) Sueanne Margarita, MD as Consulting Physician (Cardiology) Hayden Pedro, MD as Consulting Physician (Ophthalmology)  Date of Service:  03/29/2019  CHIEF COMPLAINT: F/u on CLL  CURRENT THERAPY:  Observation  INTERVAL HISTORY:  Joe [name] is here for a follow up of CLL. She presents to the clinic alone. There were last seen by me 1 year ago. He notes he is doing well. He notes he has Afib and has heart monitor placed. By Echo he has mild leaky cardiac valve. He did sleep study recently and plans to start CPAP machine. He has started Eliquis for Afib. He notes only mildly bleeding from cuts and no easy bruising. He notes he has not been very active, but has indoor bike. He denies fever or night sweats, no significant weight loss.  He is interested in [place] vaccine and wonders can he get it based on his CLL. He denies reaction to past vaccinations.  He continues to f/u with his PCP every 6 months, next in June 01, 2019.    REVIEW OF SYSTEMS:   Constitutional: Denies fevers, chills or abnormal weight loss Eyes: Denies blurriness of vision Ears, nose, mouth, throat, and face: Denies mucositis or sore throat Respiratory: Denies cough, dyspnea or wheezes Cardiovascular: Denies palpitation, chest discomfort or lower extremity swelling Gastrointestinal:  Denies nausea, heartburn or change in bowel habits Skin: Denies abnormal skin rashes Lymphatics: Denies new lymphadenopathy or easy bruising Neurological:Denies numbness, tingling or new weaknesses Behavioral/Psych: Mood is stable, no new changes  All other systems were reviewed with the patient and are negative.  MEDICAL HISTORY:  Past Medical History:  Diagnosis Date  . Alcohol abuse, daily use 08/24/2010   Stopped May 31, 2013   .  Arthralgia Dec 15, 2013   9/15 Not related to statins OA   . Cataract   . 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 CLL (chronic lymphocytic leukemia) ([place]) 08/08/2014   2014/06/01 Dr Burr Medico Stage 0  . CONJUNCTIVITIS, BACTERIAL, ACUTE 02/22/2009   Qualifier: Diagnosis of  By: Diona Browner MD, Amy    . COPD (chronic obstructive pulmonary disease) (HCC)    sats 93-97 % per pt.  [name] COPD mixed type ([place]) 07/26/2007   Smoker - stopped 6/14   . De Quervain's tenosynovitis, right December 15, 2013   05/31/2013   . DOE (dyspnea on exertion) 03/26/2014   1/16 deconditioning, COPD vs other   . Dysuria 2013-12-15   9/15 - poss stricture Urol ref was offered   . Gallstones 11/16/2017   Asymptomatic Pt refused surg ref  . Generalized anxiety disorder 09/07/2012   Chronic   Potential benefits of a long term steroid  use as well as potential risks  and complications were explained to the patient and were aknowledged.     [name] GERD 12/02/2006   Chronic     . GERD (gastroesophageal reflux disease)   . Grief 05/16/2016   [name] died in 06/01/14  . Gynecomastia 12/15/2013   Benign B 2013/05/31   . Hyperlipidemia   . Hypertension   . Hypothyroidism 12/25/2014   06-01-14 On Levothyroxine   . Intertrigo 02/02/2012   11/13   . Irregular heart beat    benign per pt.  . Left hand pain 05/31/2017  . Neoplasm of uncertain behavior of skin  03/12/2013   12/14 R ear, chest   . Night sweats 03/12/2013   12/14   . Pain of cheek 05/31/2017  . Palpitations 03/27/2013   Labs Atenolol prn  . 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 PERIORBITAL CELLULITIS 02/22/2009   Qualifier: Diagnosis of  By: Diona Browner MD, Amy    . 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 Shoulder pain 05/31/2017  . Thyroid disease   . Weight gain 05/23/2018   Try fasting Sharman Cheek "The Obesity Code"    SURGICAL HISTORY: Past Surgical History:  Procedure Laterality Date  . BICEPS TENDON REPAIR    . CATARACT EXTRACTION Left   . COLONOSCOPY  04-14-99   Dr  Flossie Dibble polyp-TA in epic  . POLYPECTOMY  04-14-99  . POSTERIOR LAMINECTOMY / DECOMPRESSION CERVICAL SPINE     Dr Saintclair Halsted  . RETINAL DETACHMENT SURGERY     left eye, 2007 x2, 2008 x 3  . ROTATOR CUFF REPAIR  2004   right  . TONSILLECTOMY  [id number]    I have reviewed the social history and family history with the patient and they are unchanged from previous note.  ALLERGIES:  has No Known Allergies.  MEDICATIONS:  Current Outpatient Medications  Medication Sig Dispense Refill  . amLODipine (NORVASC) 5 MG tablet TAKE 1 TABLET BY MOUTH EVERY DAY 90 tablet 3  . apixaban (ELIQUIS) 5 MG TABS tablet Take 1 tablet (5 mg total) by mouth 2 (two) times daily. 180 tablet 3  . atenolol (TENORMIN) 50 MG tablet Take 0.5 tablets (25 mg total) by mouth daily as needed. For palpitations 30 tablet 3  . Cholecalciferol (EQL VITAMIN D3) 1000 UNITS tablet Take 1,000 Units by mouth daily.      . diazepam (VALIUM) 5 MG tablet Take 1 tablet (5 mg total) by mouth every 12 (twelve) hours. 180 tablet 1  . levothyroxine (SYNTHROID) 50 MCG tablet TAKE 1 TABLET BY MOUTH EVERY DAY 90 tablet 3  . lovastatin (MEVACOR) 20 MG tablet TAKE 1 TABLET BY MOUTH EVERY NIGHT AT BEDTIME 90 tablet 3  . Multiple Vitamin (MULTIVITAMIN) tablet Take 1 tablet by mouth daily. Centrum Silver.     No current facility-administered medications for this visit.    PHYSICAL EXAMINATION: ECOG PERFORMANCE STATUS: 0 - Asymptomatic  Vitals:   03/29/19 1132  BP: 129/83  Pulse: 70  Resp: 18  Temp: 97.7 F (36.5 C)  SpO2: 94%   Filed Weights   03/29/19 1132  Weight: 194 lb 11.2 oz (88.3 kg)    GENERAL:alert, no distress and comfortable SKIN: skin color, texture, turgor are normal, no rashes or significant lesions EYES: normal, Conjunctiva are pink and non-injected, sclera clear  NECK: supple, thyroid normal size, non-tender, without nodularity LYMPH:  no palpable lymphadenopathy in the cervical, axillary or inguinal LUNGS: clear to  auscultation and percussion with normal breathing effort HEART: regular rate & rhythm and no murmurs and no lower extremity edema ABDOMEN:abdomen soft, non-tender and normal bowel sounds Musculoskeletal:no cyanosis of digits and no clubbing  NEURO: alert & oriented x 3 with fluent speech, no focal motor/sensory deficits  LABORATORY DATA:  I have reviewed the data as listed CBC Latest Ref Rng & Units 03/29/2019 02/06/2019 09/25/2018  WBC 4.0 - 10.5 K/uL 18.7(H) 16.2(H) 17.1(H)  Hemoglobin 13.0 - 17.0 g/dL 17.4(H) 17.2(H) 17.0  Hematocrit 39.0 - 52.0 % 53.4(H) 53.2(H) 52.8(H)  Platelets 150 - 400 K/uL 342 316 305     CMP Latest Ref  Rng & Units 03/29/2019 02/06/2019 11/23/2018  Glucose 70 - 99 mg/dL 94 87 98  BUN 8 - 23 mg/dL 12 6(L) 7  Creatinine 0.61 - 1.24 mg/dL 0.79 0.67 0.82  Sodium 135 - 145 mmol/L 142 138 142  Potassium 3.5 - 5.1 mmol/L 4.6 5.0 4.9  Chloride 98 - 111 mmol/L 104 101 102  CO2 22 - 32 mmol/L 28 28 33(H)  Calcium 8.9 - 10.3 mg/dL 9.2 9.6 9.8  Total Protein 6.5 - 8.1 g/dL 7.3 - 7.1  Total Bilirubin 0.3 - 1.2 mg/dL 2.0(H) - 1.7(H)  Alkaline Phos 38 - 126 U/L 83 - 74  AST 15 - 41 U/L 29 - 30  ALT 0 - 44 U/L 37 - 39      RADIOGRAPHIC STUDIES: I have personally reviewed the radiological images as listed and agreed with the findings in the report. No results found.   ASSESSMENT & PLAN:  Joe [name] is a 74 y.o. male with   1. CLL, stage 0 -Under observation.  -He is clinically doing well. Labs reviewed form today, CBC and CMP WNL except WBC 18.7, Hg 17.4, HCT 53.4%.I reviewed labs from his PCP office in 11/2018. He denies fever or night sweats, no significant weight loss. Physical exam unremarkable. No indication of CLL progression at this time.  -He was recently diagnosed with sleep apnea which can contribute to this, with treatment that can improve.  -Continue surveillance. F/u in 1 year and monitor labs every 6 months.  -I discussed although there is not much  data on CLL patients specifically, he should still get COVID vaccine injection. However given his CLL, he may not react to is as indicated to generate antibodies. He is interested in proceeding with vaccine when available. I encouraged him to apply pressure on any needle injections for longer given he is on blood thinner.    2. HTN, Afib, Sleep Apnea  -f/u with PCP -He was recently diagnosed with Afib. He is on Eliquis, tolerating well.  -He was also recently diagnosed with sleep apnea. Will likely proceed with CPAP machine.   Plan  -He is clinically stable. Lab reviewed. Continue surveillance -Lab in 6 months  -Lab and F/u in 1 year   No problem-specific Assessment & Plan notes found for this encounter.   No orders of the defined types were placed in this encounter.  All questions were answered. The patient knows to call the clinic with any problems, questions or concerns. No barriers to learning was detected. The total time spent in the appointment was 20 minutes.     Truitt Merle, MD 03/29/2019   I, Joslyn Devon, am acting as scribe for Truitt Merle, MD.   I have reviewed the above documentation for accuracy and completeness, and I agree with the above.

## 2019-03-29 ENCOUNTER — Encounter: Payer: Self-pay | Admitting: Hematology

## 2019-03-29 ENCOUNTER — Other Ambulatory Visit: Payer: Self-pay

## 2019-03-29 ENCOUNTER — Inpatient Hospital Stay: Payer: Medicare Other | Attending: Hematology

## 2019-03-29 ENCOUNTER — Inpatient Hospital Stay (HOSPITAL_BASED_OUTPATIENT_CLINIC_OR_DEPARTMENT_OTHER): Payer: Medicare Other | Admitting: Hematology

## 2019-03-29 ENCOUNTER — Encounter (HOSPITAL_BASED_OUTPATIENT_CLINIC_OR_DEPARTMENT_OTHER): Payer: Medicare Other | Admitting: Cardiology

## 2019-03-29 VITALS — BP 129/83 | HR 70 | Temp 97.7°F | Resp 18 | Ht 71.0 in | Wt 194.7 lb

## 2019-03-29 DIAGNOSIS — G4730 Sleep apnea, unspecified: Secondary | ICD-10-CM | POA: Diagnosis not present

## 2019-03-29 DIAGNOSIS — J449 Chronic obstructive pulmonary disease, unspecified: Secondary | ICD-10-CM | POA: Diagnosis not present

## 2019-03-29 DIAGNOSIS — I10 Essential (primary) hypertension: Secondary | ICD-10-CM | POA: Insufficient documentation

## 2019-03-29 DIAGNOSIS — Z87891 Personal history of nicotine dependence: Secondary | ICD-10-CM | POA: Insufficient documentation

## 2019-03-29 DIAGNOSIS — Z79899 Other long term (current) drug therapy: Secondary | ICD-10-CM | POA: Diagnosis not present

## 2019-03-29 DIAGNOSIS — C9110 Chronic lymphocytic leukemia of B-cell type not having achieved remission: Secondary | ICD-10-CM

## 2019-03-29 DIAGNOSIS — I4891 Unspecified atrial fibrillation: Secondary | ICD-10-CM | POA: Insufficient documentation

## 2019-03-29 DIAGNOSIS — Z7901 Long term (current) use of anticoagulants: Secondary | ICD-10-CM | POA: Diagnosis not present

## 2019-03-29 DIAGNOSIS — E785 Hyperlipidemia, unspecified: Secondary | ICD-10-CM | POA: Diagnosis not present

## 2019-03-29 DIAGNOSIS — E669 Obesity, unspecified: Secondary | ICD-10-CM | POA: Diagnosis not present

## 2019-03-29 DIAGNOSIS — E039 Hypothyroidism, unspecified: Secondary | ICD-10-CM | POA: Diagnosis not present

## 2019-03-29 DIAGNOSIS — K219 Gastro-esophageal reflux disease without esophagitis: Secondary | ICD-10-CM | POA: Diagnosis not present

## 2019-03-29 LAB — COMPREHENSIVE METABOLIC PANEL
ALT: 37 U/L (ref 0–44)
AST: 29 U/L (ref 15–41)
Albumin: 4.1 g/dL (ref 3.5–5.0)
Alkaline Phosphatase: 83 U/L (ref 38–126)
Anion gap: 10 (ref 5–15)
BUN: 12 mg/dL (ref 8–23)
CO2: 28 mmol/L (ref 22–32)
Calcium: 9.2 mg/dL (ref 8.9–10.3)
Chloride: 104 mmol/L (ref 98–111)
Creatinine, Ser: 0.79 mg/dL (ref 0.61–1.24)
GFR calc Af Amer: >60 mL/min (ref >60)
GFR calc non Af Amer: >60 mL/min (ref >60)
Glucose, Bld: 94 mg/dL (ref 70–99)
Potassium: 4.6 mmol/L (ref 3.5–5.1)
Sodium: 142 mmol/L (ref 135–145)
Total Bilirubin: 2 mg/dL — ABNORMAL HIGH (ref 0.3–1.2)
Total Protein: 7.3 g/dL (ref 6.5–8.1)

## 2019-03-29 LAB — CBC WITH DIFFERENTIAL (CANCER CENTER ONLY)
Abs Immature Granulocytes: 0.06 10*3/uL (ref 0.00–0.07)
Basophils Absolute: 0.1 10*3/uL (ref 0.0–0.1)
Basophils Relative: 1 %
Eosinophils Absolute: 0.1 10*3/uL (ref 0.0–0.5)
Eosinophils Relative: 1 %
HCT: 53.4 % — ABNORMAL HIGH (ref 39.0–52.0)
Hemoglobin: 17.4 g/dL — ABNORMAL HIGH (ref 13.0–17.0)
Immature Granulocytes: 0 %
Lymphocytes Relative: 46 %
Lymphs Abs: 8.8 10*3/uL — ABNORMAL HIGH (ref 0.7–4.0)
MCH: 30 pg (ref 26.0–34.0)
MCHC: 32.6 g/dL (ref 30.0–36.0)
MCV: 92.1 fL (ref 80.0–100.0)
Monocytes Absolute: 1.3 10*3/uL — ABNORMAL HIGH (ref 0.1–1.0)
Monocytes Relative: 7 %
Neutro Abs: 8.4 10*3/uL — ABNORMAL HIGH (ref 1.7–7.7)
Neutrophils Relative %: 45 %
Platelet Count: 342 10*3/uL (ref 150–400)
RBC: 5.8 MIL/uL (ref 4.22–5.81)
RDW: 13.2 % (ref 11.5–15.5)
WBC Count: 18.7 10*3/uL — ABNORMAL HIGH (ref 4.0–10.5)
nRBC: 0 % (ref 0.0–0.2)

## 2019-03-29 LAB — RETICULOCYTES
Immature Retic Fract: 7.6 % (ref 2.3–15.9)
RBC.: 5.8 MIL/uL (ref 4.22–5.81)
Retic Count, Absolute: 76 10*3/uL (ref 19.0–186.0)
Retic Ct Pct: 1.3 % (ref 0.4–3.1)

## 2019-03-29 NOTE — Procedures (Signed)
   Patient Name: Joe [name], Joe [name] Date: 03/27/2019 Gender: Male D.O.B: [birth year] Age (years): 28 Referring Provider: Fransico Him MD, ABSM Height (inches): 71 Interpreting Physician: Fransico Him MD, ABSM Weight (lbs): 200 RPSGT: Laren Everts BMI: 28 MRN: [id number] Neck Size: 16.00  CLINICAL INFORMATION The patient is referred for a CPAP titration to treat sleep apnea.  SLEEP STUDY TECHNIQUE As per the AASM Manual for the Scoring of Sleep and Associated Events v2.3 (April 2016) with a hypopnea requiring 4% desaturations.  The channels recorded and monitored were frontal, central and occipital EEG, electrooculogram (EOG), submentalis EMG (chin), nasal and oral airflow, thoracic and abdominal wall motion, anterior tibialis EMG, snore microphone, electrocardiogram, and pulse oximetry. Continuous positive airway pressure (CPAP) was initiated at the beginning of the study and titrated to treat sleep-disordered breathing.  MEDICATIONS Medications self-administered by patient taken the night of the study : N/A  TECHNICIAN COMMENTS Comments added by technician: Patient was restless all through the night. Comments added by scorer: N/A  RESPIRATORY PARAMETERS Optimal PAP Pressure (cm): 9  AHI at Optimal Pressure (/hr):2.6 Overall Minimal O2 (%):82.0  Supine % at Optimal Pressure (%): 100 Minimal O2 at Optimal Pressure (%): 85.0   SLEEP ARCHITECTURE The study was initiated at 9:42:36 PM and ended at 4:44:14 AM.  Sleep onset time was 31.3 minutes and the sleep efficiency was 49.2%. The total sleep time was 207.5 minutes.  The patient spent 8.9% of the night in stage N1 sleep, 74.0% in stage N2 sleep, 0.0% in stage N3 and 17.1% in REM.Stage REM latency was 205.0 minutes  Wake after sleep onset was 182.8. Alpha intrusion was absent. Supine sleep was 22.65%.  CARDIAC DATA The 2 lead EKG demonstrated sinus rhythm. The mean heart rate was 52.8 beats per minute. Other EKG  findings include: PVCs and PACs  LEG MOVEMENT DATA The total Periodic Limb Movements of Sleep (PLMS) were 0. The PLMS index was 0.0. A PLMS index of <15 is considered normal in adults.  IMPRESSIONS - The optimal PAP pressure was 9 cm of water. - Central sleep apnea was not noted during this titration (CAI = 0.0/h). - Moderate oxygen desaturations were observed during this titration (min O2 = 82.0%). - The patient snored with soft snoring volume during this titration study. - 2-lead EKG demonstrated: PVCs and PACs - Clinically significant periodic limb movements were not noted during this study. Arousals associated with PLMs were rare.  DIAGNOSIS - Obstructive Sleep Apnea (327.23 [G47.33 ICD-10]) - Nocturnal hypoxemia  RECOMMENDATIONS - Trial of CPAP therapy on 9 cm H2O with a Medium size Resmed Full Face Mask AirFit F20 mask and heated humidification. - Avoid alcohol, sedatives and other CNS depressants that may worsen sleep apnea and disrupt normal sleep architecture. - Sleep hygiene should be reviewed to assess factors that may improve sleep quality. - Weight management and regular exercise should be initiated or continued. - Return to Sleep Center for re-evaluation after 10 weeks of therapy - Recommend overnight pulse oximetry on CPAP to determine if supplemental O2 is needed.   [Electronically signed] 03/29/2019 02:05 PM  Fransico Him MD, ABSM Diplomate, American Board of Sleep Medicine

## 2019-03-30 ENCOUNTER — Telehealth: Payer: Self-pay | Admitting: Hematology

## 2019-03-30 NOTE — Telephone Encounter (Signed)
Scheduled appt per 1/7 los.  Sent a message to HIM pool to get a calendar mailed out.

## 2019-04-04 ENCOUNTER — Telehealth: Payer: Self-pay | Admitting: *Deleted

## 2019-04-04 NOTE — Telephone Encounter (Signed)
-----   Message from Sueanne Margarita, MD sent at 03/29/2019  2:07 PM EST ----- Please let patient know that they had a successful PAP titration and let DME know that orders are in EPIC.  Please set up 10 week OV with me. Please order an overnight pulse ox on CPAP for nocturnal hypoxemia.

## 2019-04-04 NOTE — Telephone Encounter (Signed)
Informed patient of sleep study results and patient understanding was verbalized. Patient understands his sleep study showed they had a successful PAP titration and let DME know that orders are in EPIC. Please set up 10 week OV with me. Please order an overnight pulse ox on CPAP for nocturnal hypoxemia.   Upon patient request DME selection is [facility]. Patient understands he will be contacted by [facility] to set up his cpap. Patient understands to call if [facility] does not contact him with new setup in a timely manner. Patient understands they will be called once confirmation has been received from [facility] that they have received their new machine to schedule 10 week follow up appointment.  BN notified of new cpap order  Please add to airview Patient was grateful for the call and thanked me.

## 2019-04-12 NOTE — Telephone Encounter (Signed)
Anderson Malta says the order for the ONO has been sent to shipping to send to the patient and she will call the patient.

## 2019-05-03 NOTE — Telephone Encounter (Signed)
Patient has a 10 week follow up appointment scheduled for 05/28/19 8:40. Patient understands he needs to keep this appointment for insurance compliance. Patient was grateful for the call and thanked me.

## 2019-05-09 ENCOUNTER — Encounter (HOSPITAL_COMMUNITY): Payer: Self-pay | Admitting: Physician Assistant

## 2019-05-09 ENCOUNTER — Telehealth: Payer: Self-pay | Admitting: *Deleted

## 2019-05-09 ENCOUNTER — Ambulatory Visit (HOSPITAL_COMMUNITY)
Admission: RE | Admit: 2019-05-09 | Discharge: 2019-05-09 | Disposition: A | Discharge status: Home / Self Care | Payer: Medicare Other | Source: Ambulatory Visit | Attending: Physician Assistant | Admitting: Physician Assistant

## 2019-05-09 ENCOUNTER — Other Ambulatory Visit: Payer: Self-pay

## 2019-05-09 VITALS — BP 132/64 | HR 74 | Ht 71.0 in | Wt 197.8 lb

## 2019-05-09 DIAGNOSIS — J449 Chronic obstructive pulmonary disease, unspecified: Secondary | ICD-10-CM | POA: Diagnosis not present

## 2019-05-09 DIAGNOSIS — G4733 Obstructive sleep apnea (adult) (pediatric): Secondary | ICD-10-CM | POA: Diagnosis not present

## 2019-05-09 DIAGNOSIS — Z856 Personal history of leukemia: Secondary | ICD-10-CM | POA: Insufficient documentation

## 2019-05-09 DIAGNOSIS — E785 Hyperlipidemia, unspecified: Secondary | ICD-10-CM | POA: Diagnosis not present

## 2019-05-09 DIAGNOSIS — D6869 Other thrombophilia: Secondary | ICD-10-CM

## 2019-05-09 DIAGNOSIS — Z833 Family history of diabetes mellitus: Secondary | ICD-10-CM | POA: Diagnosis not present

## 2019-05-09 DIAGNOSIS — Z7901 Long term (current) use of anticoagulants: Secondary | ICD-10-CM | POA: Insufficient documentation

## 2019-05-09 DIAGNOSIS — Z79890 Hormone replacement therapy (postmenopausal): Secondary | ICD-10-CM | POA: Diagnosis not present

## 2019-05-09 DIAGNOSIS — Z825 Family history of asthma and other chronic lower respiratory diseases: Secondary | ICD-10-CM | POA: Diagnosis not present

## 2019-05-09 DIAGNOSIS — I10 Essential (primary) hypertension: Secondary | ICD-10-CM | POA: Diagnosis not present

## 2019-05-09 DIAGNOSIS — K219 Gastro-esophageal reflux disease without esophagitis: Secondary | ICD-10-CM | POA: Insufficient documentation

## 2019-05-09 DIAGNOSIS — I480 Paroxysmal atrial fibrillation: Secondary | ICD-10-CM | POA: Insufficient documentation

## 2019-05-09 DIAGNOSIS — Z87891 Personal history of nicotine dependence: Secondary | ICD-10-CM | POA: Diagnosis not present

## 2019-05-09 DIAGNOSIS — Z79899 Other long term (current) drug therapy: Secondary | ICD-10-CM | POA: Insufficient documentation

## 2019-05-09 DIAGNOSIS — E039 Hypothyroidism, unspecified: Secondary | ICD-10-CM | POA: Diagnosis not present

## 2019-05-09 DIAGNOSIS — I4891 Unspecified atrial fibrillation: Secondary | ICD-10-CM | POA: Diagnosis present

## 2019-05-09 NOTE — Telephone Encounter (Signed)
-----   Message from Sueanne Margarita, MD sent at 05/08/2019  5:11 PM EST ----- No significant nocturnal hypoxemia on CPAP.  No indication for nocturnal O2 with PAP therapy

## 2019-05-09 NOTE — Progress Notes (Signed)
Primary Care Physician: Cassandria Anger, MD Primary Cardiologist: Dr Radford Pax Primary Electrophysiologist: none Referring Physician: Dr Earnestine Mealing is a 74 y.o. male with a history of HTN, CLL, COPD, HLD, hypothyroidism, and paroxysmal atrial fibrillation who presents for follow up in the [facility].  The patient was initially diagnosed with atrial fibrillation after presenting with symptoms of palpitations and heart racing which have been sporadic over the last year. An event monitor was placed which showed a 1 minute episode of afib with RVR. He was started on Eliquis and referred to the Afib Clinic. He denies significant alcohol use or snoring. He does note frequent nocturnal waking and hypoxia, sleep study ordered by Dr Radford Pax. Patient's event monitor showed 2% afib burden with the majority of episodes being rate controlled. He has also been diagnosed with OSA.  On follow up today, patient reports that he has done well since his last visit. He had two episodes of heart racing, one very brief and the other lasting about 3 hours. He did not use his PRN BB. He is now on CPAP for OSA.   Today, he denies symptoms of chest pain, shortness of breath, orthopnea, PND, lower extremity edema, dizziness, presyncope, syncope, snoring, daytime somnolence, bleeding, or neurologic sequela. The patient is tolerating medications without difficulties and is otherwise without complaint today.    Atrial Fibrillation Risk Factors:  he does have symptoms or diagnosis of sleep apnea. He is compliant with CPAP therapy.  he does not have a history of rheumatic fever. he does not have a history of alcohol use.  he has a BMI of Body mass index is 27.59 kg/m.[name] Filed Weights   05/09/19 1404  Weight: 89.7 kg    Family History  Problem Relation Age of Onset  . COPD Mother   . Diabetes Father   . Coronary artery disease Other   . Cancer Paternal Aunt 62        breast cancer   . Colon cancer Neg Hx      Atrial Fibrillation Management history:  Previous antiarrhythmic drugs: none Previous cardioversions: none Previous ablations: none CHADS2VASC score: 2 Anticoagulation history: Eliquis   Past Medical History:  Diagnosis Date  . Alcohol abuse, daily use 08/24/2010   Stopped 2015   . Arthralgia 12/13/2013   9/15 Not related to statins OA   . Cataract   . 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 CLL (chronic lymphocytic leukemia) ([place]) 08/08/2014   2016 Dr Burr Medico Stage 0  . CONJUNCTIVITIS, BACTERIAL, ACUTE 02/22/2009   Qualifier: Diagnosis of  By: Diona Browner MD, Amy    . COPD (chronic obstructive pulmonary disease) (HCC)    sats 93-97 % per pt.  [name] COPD mixed type ([place]) 07/26/2007   Smoker - stopped 6/14   . De Quervain's tenosynovitis, right 12/13/2013   2015   . DOE (dyspnea on exertion) 03/26/2014   1/16 deconditioning, COPD vs other   . Dysuria 12/13/2013   9/15 - poss stricture Urol ref was offered   . Gallstones 11/16/2017   Asymptomatic Pt refused surg ref  . Generalized anxiety disorder 09/07/2012   Chronic   Potential benefits of a long term steroid  use as well as potential risks  and complications were explained to the patient and were aknowledged.     [name] GERD 12/02/2006   Chronic     . GERD (gastroesophageal reflux disease)   .  Grief 05/14/2016   [name] died in 06/10/2014  . Gynecomastia 24-Dec-2013   Benign B 2013-06-09   . Hyperlipidemia   . Hypertension   . Hypothyroidism 12/25/2014   06-10-14 On Levothyroxine   . Intertrigo 02/02/2012   11/13   . Irregular heart beat    benign per pt.  . Left hand pain 05/31/2017  . Neoplasm of uncertain behavior of skin 03/12/2013   12/14 R ear, chest   . Night sweats 03/12/2013   12/14   . Pain of cheek 05/31/2017  . Palpitations 03/27/2013   Labs Atenolol prn  . Paresis ([place])    right- s/p cerv decompression  . PERIORBITAL CELLULITIS 02/22/2009   Qualifier: Diagnosis  of  By: Diona Browner MD, Amy    . 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 Shoulder pain 05/31/2017  . Thyroid disease   . Weight gain 05/23/2018   Try fasting Sharman Cheek "The Obesity Code"   Past Surgical History:  Procedure Laterality Date  . BICEPS TENDON REPAIR    . CATARACT EXTRACTION Left   . COLONOSCOPY  04-14-99   Dr Flossie Dibble polyp-TA in epic  . POLYPECTOMY  04-14-99  . POSTERIOR LAMINECTOMY / DECOMPRESSION CERVICAL SPINE     Dr Saintclair Halsted  . RETINAL DETACHMENT SURGERY     left eye, Jun 09, 2005 x2, 06-10-06 x 3  . ROTATOR CUFF REPAIR  2002-06-10   right  . TONSILLECTOMY  2025,4270    Current Outpatient Medications  Medication Sig Dispense Refill  . amLODipine (NORVASC) 5 MG tablet TAKE 1 TABLET BY MOUTH EVERY DAY 90 tablet 3  . apixaban (ELIQUIS) 5 MG TABS tablet Take 1 tablet (5 mg total) by mouth 2 (two) times daily. 180 tablet 3  . atenolol (TENORMIN) 50 MG tablet Take 0.5 tablets (25 mg total) by mouth daily as needed. For palpitations 30 tablet 3  . Cholecalciferol (EQL VITAMIN D3) 1000 UNITS tablet Take 1,000 Units by mouth daily.      . diazepam (VALIUM) 5 MG tablet Take 1 tablet (5 mg total) by mouth every 12 (twelve) hours. 180 tablet 1  . levothyroxine (SYNTHROID) 50 MCG tablet TAKE 1 TABLET BY MOUTH EVERY DAY 90 tablet 3  . lovastatin (MEVACOR) 20 MG tablet TAKE 1 TABLET BY MOUTH EVERY NIGHT AT BEDTIME 90 tablet 3  . Multiple Vitamin (MULTIVITAMIN) tablet Take 1 tablet by mouth daily. Centrum Silver.     No current facility-administered medications for this encounter.    No Known Allergies  Social History   Socioeconomic History  . Marital status: Widowed    Spouse name: Not on file  . Number of children: 1  . Years of education: Not on file  . Highest education level: Not on file  Occupational History  . Occupation: retired  Tobacco Use  . Smoking status: Former Smoker    Packs/day: 0.80    Years: 50.00    Pack years: 40.00    Types: Cigarettes    Quit date: 08/20/2012    Years since  quitting: 6.7  . Smokeless tobacco: Never Used  Substance and Sexual Activity  . Alcohol use: Not Currently    Alcohol/week: 28.0 standard drinks    Types: 28 Cans of beer per week    Comment: he used to drink moderate to heavly for 45 years, quit one year ago   . Drug use: No  . Sexual activity: Not Currently  Other Topics Concern  . Not on file  Social History Narrative  .  Not on file   Social Determinants of Health   Financial Resource Strain:   . Difficulty of Paying Living Expenses: Not on file  Food Insecurity:   . Worried About Charity fundraiser in the Last Year: Not on file  . Ran Out of Food in the Last Year: Not on file  Transportation Needs:   . Lack of Transportation (Medical): Not on file  . Lack of Transportation (Non-Medical): Not on file  Physical Activity: Inactive  . Days of Exercise per Week: 0 days  . Minutes of Exercise per Session: 0 min  Stress:   . Feeling of Stress : Not on file  Social Connections: Unknown  . Frequency of Communication with Friends and Family: Not on file  . Frequency of Social Gatherings with Friends and Family: Not on file  . Attends Religious Services: Not on file  . Active Member of Clubs or Organizations: Yes  . Attends Archivist Meetings: Not on file  . Marital Status: Not on file  Intimate Partner Violence:   . Fear of Current or Ex-Partner: Not on file  . Emotionally Abused: Not on file  . Physically Abused: Not on file  . Sexually Abused: Not on file     ROS- All systems are reviewed and negative except as per the HPI above.  Physical Exam: Vitals:   05/09/19 1404  BP: 132/64  Pulse: 74  Weight: 89.7 kg  Height: 5\' 11"  (1.803 m)    GEN- The patient is well appearing elderly male, alert and oriented x 3 today.   HEENT-head normocephalic, atraumatic, sclera clear, conjunctiva pink, hearing intact, trachea midline. Lungs- Clear to ausculation bilaterally, normal work of breathing Heart- Regular  rate and rhythm, no murmurs, rubs or gallops  GI- soft, NT, ND, + BS Extremities- no clubbing, cyanosis, or edema MS- no significant deformity or atrophy Skin- no rash or lesion Psych- euthymic mood, full affect Neuro- strength and sensation are intact   Wt Readings from Last 3 Encounters:  05/09/19 89.7 kg  03/29/19 88.3 kg  03/27/19 90.7 kg    EKG from today demonstrates SR HR 74, inc RBBB, PR 178, QRS 108, QTc 426  Echo 02/13/19 demonstrated 1. Left ventricular ejection fraction, by visual estimation, is 60 to  65%. The left ventricle has normal function. There is moderately increased  left ventricular hypertrophy.  2. Left ventricular diastolic parameters are indeterminate.  3. Global right ventricle has normal systolic function.The right  ventricular size is normal.  4. Left atrial size was normal.  5. Right atrial size was normal.  6. The mitral valve is normal in structure. No evidence of mitral valve  regurgitation.  7. The tricuspid valve is not well visualized. Tricuspid valve  regurgitation is not demonstrated.  8. The aortic valve was not well visualized. Aortic valve regurgitation  is mild to moderate. No evidence of aortic valve sclerosis or stenosis.  9. The pulmonic valve was not well visualized. Pulmonic valve  regurgitation is not visualized.  10. The inferior vena cava is normal in size with greater than 50%  respiratory variability, suggesting right atrial pressure of 3 mmHg.   Epic records are reviewed at length today  Assessment and Plan:  1. Paroxysmal atrial fibrillation Patient having rare episodes of heart racing. Overall, he is happy with his present therapy. Continue Eliquis 5 mg BID.  Continue atenolol to 25 mg daily PRN for heart racing   This patients CHA2DS2-VASc Score and unadjusted Ischemic Stroke  Rate (% per year) is equal to 2.2 % stroke rate/year from a score of 2  Above score calculated as 1 point each if present [CHF,  HTN, DM, Vascular=MI/PAD/Aortic Plaque, Age if 65-74, or Male] Above score calculated as 2 points each if present [Age > 75, or Stroke/TIA/TE]   2. HTN Stable, no changes today.  3. OSA The importance of adequate treatment of sleep apnea was discussed today in order to improve our ability to maintain sinus rhythm long term. Patient compliant with CPAP therapy.   Follow up with Dr Radford Pax as scheduled. AF clinic in 6 months.    [facility] [address] [place], [place] [address] [phone] 05/09/2019 3:10 PM

## 2019-05-09 NOTE — Telephone Encounter (Signed)
Pt is aware and agreeable to normal results.  Patient understands his pulse oximetry showed No significant nocturnal hypoxemia on CPAP. No indication for nocturnal O2 with PAP therapy. Patient agrees and thanked me for call.

## 2019-05-20 ENCOUNTER — Other Ambulatory Visit: Payer: Self-pay

## 2019-05-20 ENCOUNTER — Ambulatory Visit: Payer: Medicare Other | Attending: Internal Medicine

## 2019-05-20 DIAGNOSIS — Z23 Encounter for immunization: Secondary | ICD-10-CM | POA: Insufficient documentation

## 2019-05-20 NOTE — Progress Notes (Signed)
   Covid-19 Vaccination Clinic  Name:  Joe [name]    MRN: [id number] DOB: September 20, 1945  05/20/2019  [name] was observed post Covid-19 immunization for 15 minutes without incidence. He was provided with Vaccine Information Sheet and instruction to access the V-Safe system.   [name] was instructed to call 911 with any severe reactions post vaccine: [name] Difficulty breathing  . Swelling of your face and throat  . A fast heartbeat  . A bad rash all over your body  . Dizziness and weakness    Immunizations Administered    Name Date Dose VIS Date Route   Pfizer COVID-19 Vaccine 05/20/2019  9:59 AM 0.3 mL 03/02/2019 Intramuscular   Manufacturer: [place]   Lot: [id number]   [place]: [id number]

## 2019-05-23 ENCOUNTER — Other Ambulatory Visit: Payer: Self-pay

## 2019-05-23 ENCOUNTER — Ambulatory Visit (INDEPENDENT_AMBULATORY_CARE_PROVIDER_SITE_OTHER): Payer: Medicare Other | Admitting: Internal Medicine

## 2019-05-23 ENCOUNTER — Encounter: Payer: Self-pay | Admitting: Internal Medicine

## 2019-05-23 DIAGNOSIS — L570 Actinic keratosis: Secondary | ICD-10-CM | POA: Diagnosis not present

## 2019-05-23 DIAGNOSIS — I10 Essential (primary) hypertension: Secondary | ICD-10-CM | POA: Diagnosis not present

## 2019-05-23 DIAGNOSIS — E785 Hyperlipidemia, unspecified: Secondary | ICD-10-CM | POA: Diagnosis not present

## 2019-05-23 DIAGNOSIS — D485 Neoplasm of uncertain behavior of skin: Secondary | ICD-10-CM

## 2019-05-23 DIAGNOSIS — C9110 Chronic lymphocytic leukemia of B-cell type not having achieved remission: Secondary | ICD-10-CM

## 2019-05-23 DIAGNOSIS — I480 Paroxysmal atrial fibrillation: Secondary | ICD-10-CM

## 2019-05-23 DIAGNOSIS — I4892 Unspecified atrial flutter: Secondary | ICD-10-CM

## 2019-05-23 DIAGNOSIS — I351 Nonrheumatic aortic (valve) insufficiency: Secondary | ICD-10-CM | POA: Diagnosis not present

## 2019-05-23 DIAGNOSIS — F411 Generalized anxiety disorder: Secondary | ICD-10-CM

## 2019-05-23 LAB — BASIC METABOLIC PANEL
BUN: 12 mg/dL (ref 6–23)
CO2: 29 mEq/L (ref 19–32)
Calcium: 9.8 mg/dL (ref 8.4–10.5)
Chloride: 103 mEq/L (ref 96–112)
Creatinine, Ser: 0.73 mg/dL (ref 0.40–1.50)
GFR: 105.26 mL/min (ref >60.00)
Glucose, Bld: 101 mg/dL — ABNORMAL HIGH (ref 70–99)
Potassium: 4.3 mEq/L (ref 3.5–5.1)
Sodium: 139 mEq/L (ref 135–145)

## 2019-05-23 LAB — TSH: TSH: 3.89 u[IU]/mL (ref 0.35–4.50)

## 2019-05-23 MED ORDER — ATENOLOL 25 MG PO TABS: 25.0000 mg | ORAL_TABLET | Freq: Every day | ORAL | 3 refills | Status: DC | PRN

## 2019-05-23 MED ORDER — DIAZEPAM 5 MG PO TABS: 5.0000 mg | ORAL_TABLET | Freq: Two times a day (BID) | ORAL | 1 refills | Status: DC

## 2019-05-23 NOTE — Assessment & Plan Note (Signed)
Tenormin

## 2019-05-23 NOTE — Progress Notes (Signed)
Subjective:  Patient ID: Joe [name], male    DOB: [birth year]  Age: 74 y.o. MRN: [id number]  CC: No chief complaint on file.   HPI Joe [name] presents for HTN, CAD, wt gain f/u F/u AI  Outpatient Medications Prior to Visit  Medication Sig Dispense Refill  . amLODipine (NORVASC) 5 MG tablet TAKE 1 TABLET BY MOUTH EVERY DAY 90 tablet 3  . apixaban (ELIQUIS) 5 MG TABS tablet Take 1 tablet (5 mg total) by mouth 2 (two) times daily. 180 tablet 3  . atenolol (TENORMIN) 50 MG tablet Take 0.5 tablets (25 mg total) by mouth daily as needed. For palpitations 30 tablet 3  . Cholecalciferol (EQL VITAMIN D3) 1000 UNITS tablet Take 1,000 Units by mouth daily.      . diazepam (VALIUM) 5 MG tablet Take 1 tablet (5 mg total) by mouth every 12 (twelve) hours. 180 tablet 1  . levothyroxine (SYNTHROID) 50 MCG tablet TAKE 1 TABLET BY MOUTH EVERY DAY 90 tablet 3  . lovastatin (MEVACOR) 20 MG tablet TAKE 1 TABLET BY MOUTH EVERY NIGHT AT BEDTIME 90 tablet 3  . Multiple Vitamin (MULTIVITAMIN) tablet Take 1 tablet by mouth daily. Centrum Silver.     No facility-administered medications prior to visit.    ROS: Review of Systems  Constitutional: Negative for appetite change, fatigue and unexpected weight change.  HENT: Negative for congestion, nosebleeds, sneezing, sore throat and trouble swallowing.   Eyes: Negative for itching and visual disturbance.  Respiratory: Negative for cough.   Cardiovascular: Negative for chest pain, palpitations and leg swelling.  Gastrointestinal: Negative for abdominal distention, blood in stool, diarrhea and nausea.  Genitourinary: Negative for frequency and hematuria.  Musculoskeletal: Positive for arthralgias, back pain and gait problem. Negative for joint swelling and neck pain.  Skin: Negative for rash.  Neurological: Negative for dizziness, tremors, speech difficulty and weakness.  Psychiatric/Behavioral: Negative for agitation, dysphoric mood, sleep  disturbance and suicidal ideas. The patient is not nervous/anxious.     Objective:  BP 124/80 (BP Location: Left Arm, Patient Position: Sitting, Cuff Size: Large)   Pulse 67   Temp 98.5 F (36.9 C) (Oral)   Ht 5\' 11"  (1.803 m)   Wt 197 lb (89.4 kg)   SpO2 94%   BMI 27.48 kg/m   BP Readings from Last 3 Encounters:  05/23/19 124/80  05/09/19 132/64  03/29/19 129/83    Wt Readings from Last 3 Encounters:  05/23/19 197 lb (89.4 kg)  05/09/19 197 lb 12.8 oz (89.7 kg)  03/29/19 194 lb 11.2 oz (88.3 kg)    Physical Exam Constitutional:      General: He is not in acute distress.    Appearance: He is well-developed. He is obese.     Comments: NAD  Eyes:     Conjunctiva/sclera: Conjunctivae normal.     Pupils: Pupils are equal, round, and reactive to light.  Neck:     Thyroid: No thyromegaly.     Vascular: No JVD.  Cardiovascular:     Rate and Rhythm: Normal rate and regular rhythm.     Heart sounds: Normal heart sounds. No murmur. No friction rub. No gallop.   Pulmonary:     Effort: Pulmonary effort is normal. No respiratory distress.     Breath sounds: Normal breath sounds. No wheezing or rales.  Chest:     Chest wall: No tenderness.  Abdominal:     General: Bowel sounds are normal. There is no distension.  Palpations: Abdomen is soft. There is no mass.     Tenderness: There is no abdominal tenderness. There is no guarding or rebound.  Musculoskeletal:        General: Tenderness present. Normal range of motion.     Cervical back: Normal range of motion.  Lymphadenopathy:     Cervical: No cervical adenopathy.  Skin:    General: Skin is warm and dry.     Findings: No rash.  Neurological:     Mental Status: He is alert and oriented to person, place, and time.     Cranial Nerves: No cranial nerve deficit.     Motor: Weakness present. No abnormal muscle tone.     Coordination: Coordination normal.     Gait: Gait abnormal.     Deep Tendon Reflexes: Reflexes are  normal and symmetric.  Psychiatric:        Behavior: Behavior normal.        Thought Content: Thought content normal.        Judgment: Judgment normal.   AK L ear   Procedure Note :     Procedure : Cryosurgery   Indication: Actinic keratosis(es)    Risks including unsuccessful procedure , bleeding, infection, bruising, scar, a need for a repeat  procedure and others were explained to the patient in detail as well as the benefits. Informed consent was obtained verbally.   1  lesion(s)  on  L ear  was/were treated with liquid nitrogen on a Q-tip in a usual fasion . Band-Aid was applied and antibiotic ointment was given for a later use.   Tolerated well. Complications none.   Postprocedure instructions :     Keep the wounds clean. You can wash them with liquid soap and water. Pat dry with gauze or a Kleenex tissue  Before applying antibiotic ointment and a Band-Aid.   You need to report immediately  if  any signs of infection develop.     Lab Results  Component Value Date   WBC 18.7 (H) 03/29/2019   HGB 17.4 (H) 03/29/2019   HCT 53.4 (H) 03/29/2019   PLT 342 03/29/2019   GLUCOSE 94 03/29/2019   CHOL 153 11/23/2018   TRIG 201.0 (H) 11/23/2018   HDL 38.20 (L) 11/23/2018   LDLDIRECT 99.0 11/23/2018   LDLCALC 80 11/07/2017   ALT 37 03/29/2019   AST 29 03/29/2019   NA 142 03/29/2019   K 4.6 03/29/2019   CL 104 03/29/2019   CREATININE 0.79 03/29/2019   BUN 12 03/29/2019   CO2 28 03/29/2019   TSH 3.16 05/23/2018   PSA 0.23 11/23/2018    No results found.  Assessment & Plan:   There are no diagnoses linked to this encounter.   No orders of the defined types were placed in this encounter.    Follow-up: No follow-ups on file.  Walker Kehr, MD

## 2019-05-23 NOTE — Assessment & Plan Note (Signed)
Chronic Diazepam prn  Potential benefits of a long term benzodiazepines  use as well as potential risks  and complications were explained to the patient and were aknowledged. 

## 2019-05-23 NOTE — Assessment & Plan Note (Signed)
Eliquis 

## 2019-05-23 NOTE — Assessment & Plan Note (Signed)
Normal EF 60-65%, mod LVH, mild-mod AI. ECHO in 12 mo

## 2019-05-23 NOTE — Assessment & Plan Note (Signed)
Lovastatin 

## 2019-05-23 NOTE — Assessment & Plan Note (Signed)
F/u w/Dr Burr Medico

## 2019-05-25 ENCOUNTER — Telehealth: Payer: Self-pay | Admitting: *Deleted

## 2019-05-25 NOTE — Telephone Encounter (Signed)

## 2019-05-26 NOTE — Progress Notes (Signed)
Virtual Visit via telephone Note   This visit type was conducted due to national recommendations for restrictions regarding the COVID-19 Pandemic (e.g. social distancing) in an effort to limit this patient's exposure and mitigate transmission in our community.  Due to his co-morbid illnesses, this patient is at least at moderate risk for complications without adequate follow up.  This format is felt to be most appropriate for this patient at this time.  All issues noted in this document were discussed and addressed.  A limited physical exam was performed with this format.  Please refer to the patient's chart for his consent to telehealth for [facility].   Evaluation Performed:  Follow-up visit  This visit type was conducted due to national recommendations for restrictions regarding the COVID-19 Pandemic (e.g. social distancing).  This format is felt to be most appropriate for this patient at this time.  All issues noted in this document were discussed and addressed.  No physical exam was performed (except for noted visual exam findings with Video Visits).  Please refer to the patient's chart (MyChart message for video visits and phone note for telephone visits) for the patient's consent to telehealth for [facility].  Date:  05/28/2019   ID:  Joe [name], DOB 17-Oct-1945, MRN [id number]  Patient Location:  Home  Provider location:   [place]  PCP:  [name], [name], MD  Cardiologist: Fransico Him, MD Electrophysiologist:  None   Chief Complaint:  HTN PAF  History of Present Illness:    Joe [name] is a 74 y.o. male who presents via audio/video conferencing for a telehealth visit today.    Joe [name] is a 74 y.o. male with a hx of HTN.  He was seen by me in 2015 when he had an EKG that the PCP though was afib but after review there was no findings of afib on EKG.He saw Dr. Alain Marion in March and was placed on Atenolol 50mg  PRN for palpitations.  When I last  saw him he complained of waking up a lot at night to urinate and he PCP was concerned that he may have underlying OSA triggering palpitations.   He underwent home sleep study which showed mild OSA with an AHI of 9.2/hr with nocturnal hypoxemia with O2 sat 75% and mild snoring.  He underwent CPAP titration to 9cm H2O.  He is doing well with his CPAP device and thinks that he has gotten used to it.  He tolerates the full face mask and feels the pressure is adequate.  Since going on CPAP he feels rested in the am and has no significant daytime sleepiness.  He denies any significant mouth or nasal dryness or nasal congestion.  He does not think that he snores.    The patient does not have symptoms concerning for COVID-19 infection (fever, chills, cough, or new shortness of breath).   Prior CV studies:   The following studies were reviewed today:  Home sleep study, CPAP titration, PAP compliance download from [place]  Past Medical History:  Diagnosis Date  . Alcohol abuse, daily use 08/24/2010   Stopped 2015   . Arthralgia 12/13/2013   9/15 Not related to statins OA   . Cataract   . 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 CLL (chronic lymphocytic leukemia) ([place]) 08/08/2014   2016 Dr Burr Medico Stage 0  . CONJUNCTIVITIS, BACTERIAL, ACUTE 02/22/2009   Qualifier: Diagnosis of  By: Diona Browner MD, Amy    .  COPD (chronic obstructive pulmonary disease) (HCC)    sats 93-97 % per pt.  [name] COPD mixed type ([place]) 07/26/2007   Smoker - stopped 6/14   . De Quervain's tenosynovitis, right 2013-12-16   06/01/2013   . DOE (dyspnea on exertion) 03/26/2014   1/16 deconditioning, COPD vs other   . Dysuria 16-Dec-2013   9/15 - poss stricture Urol ref was offered   . Gallstones 11/16/2017   Asymptomatic Pt refused surg ref  . Generalized anxiety disorder 09/07/2012   Chronic   Potential benefits of a long term steroid  use as well as potential risks  and complications were explained to the patient  and were aknowledged.     [name] GERD 12/02/2006   Chronic     . GERD (gastroesophageal reflux disease)   . Grief 05-17-2016   Joe [name] died in Jun 02, 2014  . Gynecomastia December 16, 2013   Benign B 01-Jun-2013   . Hyperlipidemia   . Hypertension   . Hypothyroidism 12/25/2014   06/02/2014 On Levothyroxine   . Intertrigo 02/02/2012   11/13   . Irregular heart beat    benign per pt.  . Left hand pain 05/31/2017  . Neoplasm of uncertain behavior of skin 03/12/2013   12/14 R ear, chest   . Night sweats 03/12/2013   12/14   . OSA on CPAP    mild with AHI 9/hr and oxygen desats as low as 75%  . Pain of cheek 05/31/2017  . Palpitations 03/27/2013   Labs Atenolol prn  . 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 PERIORBITAL CELLULITIS 02/22/2009   Qualifier: Diagnosis of  By: Diona Browner MD, Amy    . 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 Shoulder pain 05/31/2017  . Thyroid disease   . Weight gain 05/23/2018   Try fasting Sharman Cheek "The Obesity Code"   Past Surgical History:  Procedure Laterality Date  . BICEPS TENDON REPAIR    . CATARACT EXTRACTION Left   . COLONOSCOPY  04-14-99   Dr Flossie Dibble polyp-TA in epic  . POLYPECTOMY  04-14-99  . POSTERIOR LAMINECTOMY / DECOMPRESSION CERVICAL SPINE     Dr Saintclair Halsted  . RETINAL DETACHMENT SURGERY     left eye, 06-01-05 x2, 06-02-2006 x 3  . ROTATOR CUFF REPAIR  June 02, 2002   right  . TONSILLECTOMY  [id number]     Current Meds  Medication Sig  . amLODipine (NORVASC) 5 MG tablet TAKE 1 TABLET BY MOUTH EVERY DAY  . apixaban (ELIQUIS) 5 MG TABS tablet Take 1 tablet (5 mg total) by mouth 2 (two) times daily.  [name] atenolol (TENORMIN) 25 MG tablet Take 1 tablet (25 mg total) by mouth daily as needed (for palpitations).  . Cholecalciferol (EQL VITAMIN D3) 1000 UNITS tablet Take 1,000 Units by mouth daily.    . diazepam (VALIUM) 5 MG tablet Take 1 tablet (5 mg total) by mouth every 12 (twelve) hours. (Patient taking differently: Take 5 mg by mouth daily. )  . levothyroxine (SYNTHROID) 50 MCG tablet TAKE 1  TABLET BY MOUTH EVERY DAY  . lovastatin (MEVACOR) 20 MG tablet TAKE 1 TABLET BY MOUTH EVERY NIGHT AT BEDTIME  . Multiple Vitamin (MULTIVITAMIN) tablet Take 1 tablet by mouth daily. Centrum Silver.     Allergies:   Patient has no known allergies.   Social History   Tobacco Use  . Smoking status: Former Smoker    Packs/day: 0.80    Years: 50.00    Pack years: 40.00  Types: Cigarettes    Quit date: 08/20/2012    Years since quitting: 6.7  . Smokeless tobacco: Never Used  Substance Use Topics  . Alcohol use: Not Currently    Alcohol/week: 28.0 standard drinks    Types: 28 Cans of beer per week    Comment: he used to drink moderate to heavly for 45 years, quit one year ago   . Drug use: No     Family Hx: The patient's family history includes COPD in his mother; Cancer (age of onset: 76) in his paternal aunt; Coronary artery disease in an other family member; Diabetes in his father. There is no history of Colon cancer.  ROS:   Please see the history of present illness.     All other systems reviewed and are negative.   Labs/Other Tests and Data Reviewed:    Recent Labs: 03/29/2019: ALT 37; Hemoglobin 17.4; Platelet Count 342 05/23/2019: BUN 12; Creatinine, Ser 0.73; Potassium 4.3; Sodium 139; TSH 3.89   Recent Lipid Panel Lab Results  Component Value Date/Time   CHOL 153 11/23/2018 10:35 AM   TRIG 201.0 (H) 11/23/2018 10:35 AM   HDL 38.20 (L) 11/23/2018 10:35 AM   CHOLHDL 4 11/23/2018 10:35 AM   LDLCALC 80 11/07/2017 08:59 AM   LDLDIRECT 99.0 11/23/2018 10:35 AM    Wt Readings from Last 3 Encounters:  05/28/19 197 lb (89.4 kg)  05/23/19 197 lb (89.4 kg)  05/09/19 197 lb 12.8 oz (89.7 kg)     Objective:    Vital Signs:  BP 125/66   Pulse 60   Ht 5\' 11"  (1.803 m)   Wt 197 lb (89.4 kg)   SpO2 93%   BMI 27.48 kg/m     ASSESSMENT & PLAN:    1.  OSA - The pathophysiology of obstructive sleep apnea , it's cardiovascular consequences & modes of treatment including  CPAP were discused with the patient in detail & they evidenced understanding.  The patient is tolerating PAP therapy well without any problems. The PAP download was reviewed today and showed an AHI of 0.6/hr on 9 cm H2O with 100% compliance in using more than 4 hours nightly.  The patient has been using and benefiting from PAP use and will continue to benefit from therapy.   2.  HTN -BP controlled -continue Amlodipine 5mg  daily  3.  PAF -he has had very few episodes of palpitations -denies any bleeding on DOAC -continue Eliquis 5mg  BID and Atenolol 25mg  daily PRN -outside labs from PAP on [facility] showed creatinine of 0.79 and Hbg 17.4  4.  Aortic insufficiency -mild to moderate on echo 01/2019 -repeat echo 01/2020   COVID-19 Education: The signs and symptoms of COVID-19 were discussed with the patient and how to seek care for testing (follow up with PCP or arrange E-visit).  The importance of social distancing was discussed today.  Patient Risk:   After full review of this patient's clinical status, I feel that they are at least moderate risk at this time.  Time:   Today, I have spent 20 minutes on telemedicine discussing medical problems including OSA, HTN, PAF and reviewing patient's chart including PAP compliance download on [facility] and OV notes from afib clinic.  Medication Adjustments/Labs and Tests Ordered: Current medicines are reviewed at length with the patient today.  Concerns regarding medicines are outlined above.  Tests Ordered: No orders of the defined types were placed in this encounter.  Medication Changes: No orders of the defined types were placed  in this encounter.   Disposition:  Follow up in 1 year(s)  Signed, Fransico Him, MD  05/28/2019 8:31 AM    [facility]

## 2019-05-28 ENCOUNTER — Encounter: Payer: Self-pay | Admitting: Cardiology

## 2019-05-28 ENCOUNTER — Telehealth (INDEPENDENT_AMBULATORY_CARE_PROVIDER_SITE_OTHER): Payer: Medicare Other | Admitting: Cardiology

## 2019-05-28 ENCOUNTER — Other Ambulatory Visit: Payer: Self-pay

## 2019-05-28 VITALS — BP 125/66 | HR 60 | Ht 71.0 in | Wt 197.0 lb

## 2019-05-28 DIAGNOSIS — Z9989 Dependence on other enabling machines and devices: Secondary | ICD-10-CM

## 2019-05-28 DIAGNOSIS — I10 Essential (primary) hypertension: Secondary | ICD-10-CM | POA: Diagnosis not present

## 2019-05-28 DIAGNOSIS — I351 Nonrheumatic aortic (valve) insufficiency: Secondary | ICD-10-CM | POA: Diagnosis not present

## 2019-05-28 DIAGNOSIS — G4733 Obstructive sleep apnea (adult) (pediatric): Secondary | ICD-10-CM

## 2019-05-28 DIAGNOSIS — I480 Paroxysmal atrial fibrillation: Secondary | ICD-10-CM | POA: Diagnosis not present

## 2019-05-28 DIAGNOSIS — Z87891 Personal history of nicotine dependence: Secondary | ICD-10-CM

## 2019-05-28 NOTE — Addendum Note (Signed)
Addended by: Antonieta Iba on: 05/28/2019 08:42 AM   Modules accepted: Orders

## 2019-05-28 NOTE — Patient Instructions (Signed)
Medication Instructions:  Your physician recommends that you continue on your current medications as directed. Please refer to the Current Medication list given to you today.  *If you need a refill on your cardiac medications before your next appointment, please call your pharmacy*   Testing/Procedures: Your physician has requested that you have an echocardiogram in September 2021. Echocardiography is a painless test that uses sound waves to create images of your heart. It provides your doctor with information about the size and shape of your heart and how well your heart's chambers and valves are working. This procedure takes approximately one hour. There are no restrictions for this procedure.    Follow-Up: At [facility], you and your health needs are our priority.  As part of our continuing mission to provide you with exceptional heart care, we have created designated Provider Care Teams.  These Care Teams include your primary Cardiologist (physician) and Advanced Practice Providers (APPs -  Physician Assistants and Nurse Practitioners) who all work together to provide you with the care you need, when you need it.     Your next appointment:   1 year(s)  The format for your next appointment:   Either In Person or Virtual  Provider:   Fransico Him, MD

## 2019-06-13 ENCOUNTER — Ambulatory Visit: Payer: Medicare Other | Attending: Internal Medicine

## 2019-06-13 DIAGNOSIS — Z23 Encounter for immunization: Secondary | ICD-10-CM

## 2019-06-13 NOTE — Progress Notes (Signed)
   Covid-19 Vaccination Clinic  Name:  Joe [name]    MRN: [id number] DOB: 01-Nov-1945  06/13/2019  [name] was observed post Covid-19 immunization for 15 minutes without incident. He was provided with Vaccine Information Sheet and instruction to access the V-Safe system.   [name] was instructed to call 911 with any severe reactions post vaccine: [name] Difficulty breathing  . Swelling of face and throat  . A fast heartbeat  . A bad rash all over body  . Dizziness and weakness   Immunizations Administered    Name Date Dose VIS Date Route   Pfizer COVID-19 Vaccine 06/13/2019 12:19 PM 0.3 mL 03/02/2019 Intramuscular   Manufacturer: [place]   Lot: [id number]   [place]: [id number]

## 2019-07-18 ENCOUNTER — Telehealth: Payer: Self-pay | Admitting: Internal Medicine

## 2019-07-18 NOTE — Telephone Encounter (Signed)
New Message:    Pt is calling and states he has had both vaccinations but he is now experiencing some covid symptoms. He would like to speak to someone about them. Please advise.

## 2019-07-18 NOTE — Telephone Encounter (Signed)
Pt states that last week he was having diarrhea, extreme SOB(more than normal) but has got better, body aches, did run a temperature for a few day, and chills. He would like to know your recommendation and if he should get tested.  Please advise

## 2019-07-19 NOTE — Telephone Encounter (Signed)
He can get tested, but it is not necessary if he is better. Thx

## 2019-07-20 NOTE — Telephone Encounter (Signed)
Pt notified and voiced understanding 

## 2019-07-25 ENCOUNTER — Encounter: Payer: Self-pay | Admitting: Gastroenterology

## 2019-07-26 NOTE — Telephone Encounter (Signed)
Pt called to schedule appt for SOB. Pt stated his sats are staying in the 80's. He no longer has diarrhea, body aches and the fever finally broke. Let patient know we would not be able to see him in office due to symptoms and advised him to go to urgent care and to be tested. Pt voiced understanding.

## 2019-07-26 NOTE — Telephone Encounter (Signed)
noted 

## 2019-07-27 DIAGNOSIS — Z20828 Contact with and (suspected) exposure to other viral communicable diseases: Secondary | ICD-10-CM | POA: Diagnosis not present

## 2019-07-30 ENCOUNTER — Telehealth: Payer: Self-pay

## 2019-07-30 DIAGNOSIS — R911 Solitary pulmonary nodule: Secondary | ICD-10-CM

## 2019-07-30 DIAGNOSIS — R918 Other nonspecific abnormal finding of lung field: Secondary | ICD-10-CM

## 2019-07-30 NOTE — Telephone Encounter (Signed)
  New message    The patient asking the CMA to call him back regarding respiratory issues since last week, try to call last Thursday aware that MD was not in the office.   COVID test is done at [facility] last Friday - negative   The patient voiced he was  offered an appointment with another MD refused only wanted to see Dr. Alain Marion.   The patient is aware that office is not seeing patients in the office with flu like symptoms.   Asking can the CMA give him a call back today

## 2019-07-31 NOTE — Telephone Encounter (Signed)
Telephone visit scheduled for 08/01/19

## 2019-08-01 ENCOUNTER — Encounter: Payer: Self-pay | Admitting: Internal Medicine

## 2019-08-01 ENCOUNTER — Telehealth (INDEPENDENT_AMBULATORY_CARE_PROVIDER_SITE_OTHER): Payer: Medicare Other | Admitting: Internal Medicine

## 2019-08-01 DIAGNOSIS — J449 Chronic obstructive pulmonary disease, unspecified: Secondary | ICD-10-CM

## 2019-08-01 DIAGNOSIS — J069 Acute upper respiratory infection, unspecified: Secondary | ICD-10-CM

## 2019-08-01 DIAGNOSIS — R197 Diarrhea, unspecified: Secondary | ICD-10-CM | POA: Insufficient documentation

## 2019-08-01 MED ORDER — AZITHROMYCIN 250 MG PO TABS: ORAL_TABLET | ORAL | 0 refills | Status: DC

## 2019-08-01 MED ORDER — METHYLPREDNISOLONE 4 MG PO TBPK: ORAL_TABLET | ORAL | 0 refills | Status: DC

## 2019-08-01 NOTE — Progress Notes (Addendum)
Virtual Visit via Telephone Note  I connected with Joe [name] on 08/01/19 at  8:50 AM EDT by telephone and verified that I am speaking with the correct person using two identifiers.   I discussed the limitations, risks, security and privacy concerns of performing an evaluation and management service by telephone and the availability of in person appointments. I also discussed with the patient that there may be a patient responsible charge related to this service. The patient expressed understanding and agreed to proceed.  I was located at our [facility] office. The patient was at home. There was no one else present in the visit.    History of Present Illness:   Joe [name] has been vaccinated for COVID-19.  He developed diarrhea and shortness of breath on April 21.  It lasted for 3 to 4 days.  His appetite has decreased.  He had myalgia and chills.  He was short of breath with exertion and his O2 sat dropped down to the 80s with exertion.  He is feeling better now he had a negative COVID-19 test on Sunday.  Currently his O2 sat is 94% at rest and 93% with activity his temperature is 97.3  Observations/Objective: He sounds normal on the phone  Assessment and Plan:  See plan Follow Up Instructions:    I discussed the assessment and treatment plan with the patient. The patient was provided an opportunity to ask questions and all were answered. The patient agreed with the plan and demonstrated an understanding of the instructions.   The patient was advised to call back or seek an in-person evaluation if the symptoms worsen or if the condition fails to improve as anticipated.  I provided 21 minutes of non-face-to-face time during this encounter.   Walker Kehr, MD

## 2019-08-01 NOTE — Assessment & Plan Note (Signed)
?    Viral.  Resolved

## 2019-08-01 NOTE — Assessment & Plan Note (Signed)
Zpac 

## 2019-08-01 NOTE — Assessment & Plan Note (Signed)
Hypoxemia-improved.  He continues to be dyspneic on exertion Medrol Dosepak.  Chest x-ray.  Covid test negative in a vaccinated person. The patient declined inhalers.  Call if not better

## 2019-08-23 ENCOUNTER — Ambulatory Visit (INDEPENDENT_AMBULATORY_CARE_PROVIDER_SITE_OTHER): Payer: Medicare Other

## 2019-08-23 ENCOUNTER — Telehealth: Payer: Self-pay

## 2019-08-23 DIAGNOSIS — J449 Chronic obstructive pulmonary disease, unspecified: Secondary | ICD-10-CM

## 2019-08-23 DIAGNOSIS — J069 Acute upper respiratory infection, unspecified: Secondary | ICD-10-CM | POA: Diagnosis not present

## 2019-08-23 DIAGNOSIS — R918 Other nonspecific abnormal finding of lung field: Secondary | ICD-10-CM | POA: Diagnosis not present

## 2019-08-23 NOTE — Telephone Encounter (Signed)
I called [name] and informed him of the chest x-ray findings-left upper lobe mass.  He will come tomorrow to the lab to do his bmet.  I will order chest CT with contrast.  He is aware.

## 2019-08-23 NOTE — Telephone Encounter (Signed)
Received call for chest x-ray results from [facility].  Impression: LEFT upper lobe pulmonary mass. Recommend CT thorax with contrast to evaluate for malignant lesion.

## 2019-08-24 ENCOUNTER — Other Ambulatory Visit (INDEPENDENT_AMBULATORY_CARE_PROVIDER_SITE_OTHER): Payer: Medicare Other

## 2019-08-24 DIAGNOSIS — R918 Other nonspecific abnormal finding of lung field: Secondary | ICD-10-CM | POA: Diagnosis not present

## 2019-08-24 LAB — BASIC METABOLIC PANEL
BUN: 7 mg/dL (ref 6–23)
CO2: 31 mEq/L (ref 19–32)
Calcium: 9.4 mg/dL (ref 8.4–10.5)
Chloride: 101 mEq/L (ref 96–112)
Creatinine, Ser: 0.69 mg/dL (ref 0.40–1.50)
GFR: 112.25 mL/min (ref >60.00)
Glucose, Bld: 108 mg/dL — ABNORMAL HIGH (ref 70–99)
Potassium: 4 mEq/L (ref 3.5–5.1)
Sodium: 138 mEq/L (ref 135–145)

## 2019-09-07 ENCOUNTER — Ambulatory Visit
Admission: RE | Admit: 2019-09-07 | Discharge: 2019-09-07 | Disposition: A | Discharge status: Home / Self Care | Payer: Medicare Other | Source: Ambulatory Visit | Attending: Internal Medicine | Admitting: Internal Medicine

## 2019-09-07 DIAGNOSIS — R911 Solitary pulmonary nodule: Secondary | ICD-10-CM

## 2019-09-07 DIAGNOSIS — R918 Other nonspecific abnormal finding of lung field: Secondary | ICD-10-CM

## 2019-09-07 MED ORDER — IOPAMIDOL (ISOVUE-300) INJECTION 61%
75.0000 mL | Freq: Once | INTRAVENOUS | Status: AC | PRN
  Administered 2019-09-07: 75 mL via INTRAVENOUS

## 2019-09-10 ENCOUNTER — Other Ambulatory Visit: Payer: Self-pay | Admitting: Internal Medicine

## 2019-09-10 DIAGNOSIS — R918 Other nonspecific abnormal finding of lung field: Secondary | ICD-10-CM

## 2019-09-11 ENCOUNTER — Other Ambulatory Visit: Payer: Self-pay

## 2019-09-11 ENCOUNTER — Encounter (INDEPENDENT_AMBULATORY_CARE_PROVIDER_SITE_OTHER): Payer: Medicare Other | Admitting: Ophthalmology

## 2019-09-11 DIAGNOSIS — H43813 Vitreous degeneration, bilateral: Secondary | ICD-10-CM

## 2019-09-11 DIAGNOSIS — H35033 Hypertensive retinopathy, bilateral: Secondary | ICD-10-CM | POA: Diagnosis not present

## 2019-09-11 DIAGNOSIS — H35372 Puckering of macula, left eye: Secondary | ICD-10-CM

## 2019-09-11 DIAGNOSIS — H34831 Tributary (branch) retinal vein occlusion, right eye, with macular edema: Secondary | ICD-10-CM | POA: Diagnosis not present

## 2019-09-11 DIAGNOSIS — H33301 Unspecified retinal break, right eye: Secondary | ICD-10-CM | POA: Diagnosis not present

## 2019-09-11 DIAGNOSIS — I10 Essential (primary) hypertension: Secondary | ICD-10-CM

## 2019-09-26 ENCOUNTER — Other Ambulatory Visit: Payer: Self-pay

## 2019-09-26 ENCOUNTER — Inpatient Hospital Stay: Payer: Medicare Other | Attending: Hematology

## 2019-09-26 DIAGNOSIS — R918 Other nonspecific abnormal finding of lung field: Secondary | ICD-10-CM | POA: Insufficient documentation

## 2019-09-26 DIAGNOSIS — C9110 Chronic lymphocytic leukemia of B-cell type not having achieved remission: Secondary | ICD-10-CM

## 2019-09-26 LAB — CBC WITH DIFFERENTIAL/PLATELET
Abs Immature Granulocytes: 0.04 10*3/uL (ref 0.00–0.07)
Basophils Absolute: 0.1 10*3/uL (ref 0.0–0.1)
Basophils Relative: 1 %
Eosinophils Absolute: 0.3 10*3/uL (ref 0.0–0.5)
Eosinophils Relative: 2 %
HCT: 48.8 % (ref 39.0–52.0)
Hemoglobin: 15.8 g/dL (ref 13.0–17.0)
Immature Granulocytes: 0 %
Lymphocytes Relative: 44 %
Lymphs Abs: 6.8 10*3/uL — ABNORMAL HIGH (ref 0.7–4.0)
MCH: 29.5 pg (ref 26.0–34.0)
MCHC: 32.4 g/dL (ref 30.0–36.0)
MCV: 91 fL (ref 80.0–100.0)
Monocytes Absolute: 1.4 10*3/uL — ABNORMAL HIGH (ref 0.1–1.0)
Monocytes Relative: 9 %
Neutro Abs: 6.7 10*3/uL (ref 1.7–7.7)
Neutrophils Relative %: 44 %
Platelets: 353 10*3/uL (ref 150–400)
RBC: 5.36 MIL/uL (ref 4.22–5.81)
RDW: 13.6 % (ref 11.5–15.5)
WBC: 15.3 10*3/uL — ABNORMAL HIGH (ref 4.0–10.5)
nRBC: 0 % (ref 0.0–0.2)

## 2019-09-26 LAB — COMPREHENSIVE METABOLIC PANEL
ALT: 21 U/L (ref 0–44)
AST: 25 U/L (ref 15–41)
Albumin: 3.6 g/dL (ref 3.5–5.0)
Alkaline Phosphatase: 79 U/L (ref 38–126)
Anion gap: 9 (ref 5–15)
BUN: 8 mg/dL (ref 8–23)
CO2: 26 mmol/L (ref 22–32)
Calcium: 9.7 mg/dL (ref 8.9–10.3)
Chloride: 105 mmol/L (ref 98–111)
Creatinine, Ser: 0.76 mg/dL (ref 0.61–1.24)
GFR calc Af Amer: >60 mL/min (ref >60)
GFR calc non Af Amer: >60 mL/min (ref >60)
Glucose, Bld: 89 mg/dL (ref 70–99)
Potassium: 4.3 mmol/L (ref 3.5–5.1)
Sodium: 140 mmol/L (ref 135–145)
Total Bilirubin: 1.3 mg/dL — ABNORMAL HIGH (ref 0.3–1.2)
Total Protein: 7.5 g/dL (ref 6.5–8.1)

## 2019-09-26 LAB — RETICULOCYTES
Immature Retic Fract: 10.2 % (ref 2.3–15.9)
RBC.: 5.37 MIL/uL (ref 4.22–5.81)
Retic Count, Absolute: 91.8 10*3/uL (ref 19.0–186.0)
Retic Ct Pct: 1.7 % (ref 0.4–3.1)

## 2019-10-15 ENCOUNTER — Ambulatory Visit (INDEPENDENT_AMBULATORY_CARE_PROVIDER_SITE_OTHER): Payer: Medicare Other | Admitting: Emergency Medicine

## 2019-10-15 ENCOUNTER — Encounter: Payer: Self-pay | Admitting: Emergency Medicine

## 2019-10-15 ENCOUNTER — Institutional Professional Consult (permissible substitution): Payer: Medicare Other | Admitting: Emergency Medicine

## 2019-10-15 ENCOUNTER — Other Ambulatory Visit: Payer: Self-pay

## 2019-10-15 VITALS — BP 118/68 | HR 82 | Temp 98.3°F | Ht 71.0 in | Wt 190.8 lb

## 2019-10-15 DIAGNOSIS — J449 Chronic obstructive pulmonary disease, unspecified: Secondary | ICD-10-CM | POA: Diagnosis not present

## 2019-10-15 DIAGNOSIS — R918 Other nonspecific abnormal finding of lung field: Secondary | ICD-10-CM | POA: Insufficient documentation

## 2019-10-15 DIAGNOSIS — Z01818 Encounter for other preprocedural examination: Secondary | ICD-10-CM | POA: Diagnosis not present

## 2019-10-15 DIAGNOSIS — I480 Paroxysmal atrial fibrillation: Secondary | ICD-10-CM

## 2019-10-15 DIAGNOSIS — G4733 Obstructive sleep apnea (adult) (pediatric): Secondary | ICD-10-CM | POA: Diagnosis not present

## 2019-10-15 DIAGNOSIS — I4892 Unspecified atrial flutter: Secondary | ICD-10-CM | POA: Diagnosis not present

## 2019-10-15 DIAGNOSIS — Z9989 Dependence on other enabling machines and devices: Secondary | ICD-10-CM

## 2019-10-15 LAB — COMPREHENSIVE METABOLIC PANEL
ALT: 17 U/L (ref 0–53)
AST: 22 U/L (ref 0–37)
Albumin: 3.9 g/dL (ref 3.5–5.2)
Alkaline Phosphatase: 75 U/L (ref 39–117)
BUN: 6 mg/dL (ref 6–23)
CO2: 28 mEq/L (ref 19–32)
Calcium: 9.9 mg/dL (ref 8.4–10.5)
Chloride: 103 mEq/L (ref 96–112)
Creatinine, Ser: 0.69 mg/dL (ref 0.40–1.50)
GFR: 112.21 mL/min (ref >60.00)
Glucose, Bld: 94 mg/dL (ref 70–99)
Potassium: 4.1 mEq/L (ref 3.5–5.1)
Sodium: 137 mEq/L (ref 135–145)
Total Bilirubin: 1.2 mg/dL (ref 0.2–1.2)
Total Protein: 7.6 g/dL (ref 6.0–8.3)

## 2019-10-15 LAB — CBC
HCT: 45.8 % (ref 39.0–52.0)
Hemoglobin: 15.2 g/dL (ref 13.0–17.0)
MCHC: 33.3 g/dL (ref 30.0–36.0)
MCV: 89.3 fl (ref 78.0–100.0)
Platelets: 348 10*3/uL (ref 150.0–400.0)
RBC: 5.13 Mil/uL (ref 4.22–5.81)
RDW: 14.4 % (ref 11.5–15.5)
WBC: 14.9 10*3/uL — ABNORMAL HIGH (ref 4.0–10.5)

## 2019-10-15 LAB — APTT: aPTT: 37.5 s — ABNORMAL HIGH (ref 23.4–32.7)

## 2019-10-15 LAB — PROTIME-INR
INR: 1.4 ratio — ABNORMAL HIGH (ref 0.8–1.0)
Prothrombin Time: 16 s — ABNORMAL HIGH (ref 9.6–13.1)

## 2019-10-15 NOTE — H&P (View-Only) (Signed)
Subjective:    Patient ID: Joe Joe [name], male    DOB: 06-21-1945, 74 y.o.   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  Not currently on bronchodilator therapy He he had increased dyspnea, viral syndrome beginning in April, lasted over 6 weeks, was COVID negative.  Was treated with azithro and steroids in early June.  A chest x-ray done 08/23/2019 reviewed by me, showed a left upper lobe masslike lesion.  This prompted a CT chest 09/07/2019 which I reviewed. and which shows a large centrally necrotic 6.2 cm left upper lobe/hilar mass.  There are some small associated hilar mediastinal enlarged lymph nodes.   Breathing is better, no CP. His exertional tolerance is a bit limited - has some difficulty shopping, carrying groceries. He has seen exertional desats before. No wheeze, no chest tightness. Never coughs.    Review of Systems As per HPI  Past Medical History:  Diagnosis Date  . Alcohol abuse, daily use 08/24/2010   Stopped 06-24-13   . Arthralgia 01/08/2014   9/15 Not related to statins OA   . Cataract   . Chronic lymphocytic leukemia ([place]) 07-18-2014   chronic stage 1- no symtoms  . CLL (chronic lymphocytic leukemia) ([place]) 08/08/2014   06/25/2014 Joe Joe [name] Stage 0  . CONJUNCTIVITIS, BACTERIAL, ACUTE 02/22/2009   Qualifier: Diagnosis of  By: Diona Browner MD, Joe    . COPD (chronic obstructive pulmonary disease) (HCC)    sats 93-97 % per pt.  [name] COPD mixed type ([place]) 07/26/2007   Smoker - stopped 6/14   . De Quervain's tenosynovitis, right 2014-01-08   June 24, 2013   . DOE (dyspnea on exertion) 03/26/2014   1/16 deconditioning, COPD vs other   . Dysuria 01-08-2014   9/15 - poss stricture Urol ref was offered   . Gallstones 11/16/2017   Asymptomatic Pt refused surg ref  . Generalized anxiety disorder 09/07/2012   Chronic   Potential benefits of a long term steroid  use as well as  potential risks  and complications were explained to the patient and were aknowledged.     [name] GERD 12/02/2006   Chronic     . GERD (gastroesophageal reflux disease)   . Grief 06-09-2016   Joe Joe [name] died in 06-25-14  . Gynecomastia 2014/01/08   Benign B 2013-06-24   . Hyperlipidemia   . Hypertension   . Hypothyroidism 12/25/2014   2014-06-25 On Levothyroxine   . Intertrigo 02/02/2012   11/13   . Irregular heart beat    benign per pt.  . Left hand pain 05/31/2017  . Neoplasm of uncertain behavior of skin 03/12/2013   12/14 R ear, chest   . Night sweats 03/12/2013   12/14   . OSA on CPAP    mild with AHI 9/hr and oxygen desats as low as 75%  . Pain of Joe [name] 05/31/2017  . Palpitations 03/27/2013   Labs Atenolol prn  . Paresis ([place])    right- s/p cerv decompression  . PERIORBITAL CELLULITIS 02/22/2009   Qualifier: Diagnosis of  By: Diona Browner MD, Joe    . 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 Shoulder pain 05/31/2017  . Thyroid disease   . Weight gain 05/23/2018   Try fasting Joe Joe [name] "The Obesity Code"     Family History  Problem Relation Age of Onset  . COPD Mother   . Diabetes Father   .  Coronary artery disease Other   . Breast cancer Paternal Aunt 12  . Colon cancer Neg Hx      Social History   Socioeconomic History  . Marital status: Widowed    Spouse name: Not on file  . Number of children: 1  . Years of education: Not on file  . Highest education level: Not on file  Occupational History  . Occupation: retired  Tobacco Use  . Smoking status: Former Smoker    Packs/day: 0.80    Years: 50.00    Pack years: 40.00    Types: Cigarettes    Quit date: 08/20/2012    Years since quitting: 7.1  . Smokeless tobacco: Never Used  Vaping Use  . Vaping Use: Never used  Substance and Sexual Activity  . Alcohol use: Not Currently    Alcohol/week: 28.0 standard drinks    Types: 28 Cans of beer per week    Comment: he used to drink moderate to heavly for 45 years, quit one year ago   . Drug use: No  .  Sexual activity: Not Currently  Other Topics Concern  . Not on file  Social History Narrative  . Not on file   Social Determinants of Health   Financial Resource Strain:   . Difficulty of Paying Living Expenses:   Food Insecurity:   . Worried About Charity fundraiser in the Last Year:   . Arboriculturist in the Last Year:   Transportation Needs:   . Film/video editor (Medical):   [name] Lack of Transportation (Non-Medical):   Physical Activity: Inactive  . Days of Exercise per Week: 0 days  . Minutes of Exercise per Session: 0 min  Stress:   . Feeling of Stress :   Social Connections: Unknown  . Frequency of Communication with Friends and Family: Not on file  . Frequency of Social Gatherings with Friends and Family: Not on file  . Attends Religious Services: Not on file  . Active Member of Clubs or Organizations: Yes  . Attends Archivist Meetings: Not on file  . Marital Status: Not on file  Intimate Partner Violence:   . Fear of Current or Ex-Partner:   . Emotionally Abused:   [name] Physically Abused:   . 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 amLODipine (NORVASC) 5 MG tablet TAKE 1 TABLET BY MOUTH EVERY DAY 90 tablet 3  . apixaban (ELIQUIS) 5 MG TABS tablet Take 1 tablet (5 mg total) by mouth 2 (two) times daily. 180 tablet 3  . atenolol (TENORMIN) 25 MG tablet Take 1 tablet (25 mg total) by mouth daily as needed (for palpitations). 90 tablet 3  . Cholecalciferol (EQL VITAMIN D3) 1000 UNITS tablet Take 1,000 Units by mouth daily.      . diazepam (VALIUM) 5 MG tablet Take 1 tablet (5 mg total) by mouth every 12 (twelve) hours. (Patient taking differently: Take 5 mg by mouth daily. ) 180 tablet 1  . levothyroxine (SYNTHROID) 50 MCG tablet TAKE 1 TABLET BY MOUTH EVERY DAY 90 tablet 3  . lovastatin (MEVACOR) 20  MG tablet TAKE 1 TABLET BY MOUTH EVERY NIGHT AT BEDTIME 90 tablet 3  . Multiple Vitamin (MULTIVITAMIN) tablet Take 1 tablet by mouth daily. Centrum Silver.    [name] atenolol (TENORMIN)  50 MG tablet Take 0.5 tablets (25 mg total) by mouth daily as needed. For palpitations (Patient not taking: Reported on 05/28/2019) 30 tablet 3  . azithromycin (ZITHROMAX Z-PAK) 250 MG tablet As directed 6 tablet 0  . methylPREDNISolone (MEDROL DOSEPAK) 4 MG TBPK tablet As directed 21 tablet 0   No facility-administered medications prior to visit.        Objective:   Physical Exam Vitals:   10/15/19 1403  BP: 118/68  Pulse: 82  Temp: 98.3 F (36.8 C)  TempSrc: Oral  SpO2: 96%  Weight: 190 lb 12.8 oz (86.5 kg)  Height: 5\' 11"  (1.803 m)   Gen: Pleasant, elderly man, in no distress,  normal affect  ENT: No lesions,  mouth clear,  oropharynx clear, no postnasal drip  Neck: No JVD, no stridor  Lungs: No use of accessory muscles, distant, no wheeze, few left-sided inspiratory squeaks  Cardiovascular: RRR, heart sounds normal, no murmur or gallops, no peripheral edema  Musculoskeletal: No deformities, no cyanosis or clubbing  Neuro: alert, awake, non focal  Skin: Warm, no lesions or rash      Assessment & Plan:  Mass of upper lobe of left lung With associated mediastinal lymphadenopathy.  Consistent with malignancy.  I have recommended navigational bronchoscopy and endobronchial ultrasound to obtain a tissue diagnosis.  We will also need to push forward with the staging evaluation.  Hopefully I can arrange for 10/23/2019.  He will need to stop his Eliquis 3 days prior.  We will work on arranging for a bronchoscopy to evaluate your left upper lobe mass. Goal will be to do to this 10/23/19.  You will need to stop your Eliquis 3 days prior to your procedure.  We may decide to start an inhaled medication for COPD at some point going forward.  Follow with Joe Lamonte Sakai in 1 month or next available.   COPD mixed  type Suspect he does have COPD, especially based on the symptoms he had in the spring into June.  He benefited from steroids, azithromycin, consistent with an acute exacerbation.  We talked today about possibly starting empiric bronchodilators.  He wants to hold off for now.  I suspect we will need to perform pulmonary function testing and determine his degree of obstruction before he is going to want to start therapy.  OSA on CPAP Good compliance and good clinical benefit.  Baltazar Apo, MD, PhD 10/15/2019, 2:50 PM [facility] [phone] or if no answer [phone]

## 2019-10-15 NOTE — Assessment & Plan Note (Signed)
With associated mediastinal lymphadenopathy.  Consistent with malignancy.  I have recommended navigational bronchoscopy and endobronchial ultrasound to obtain a tissue diagnosis.  We will also need to push forward with the staging evaluation.  Hopefully I can arrange for 10/23/2019.  He will need to stop his Eliquis 3 days prior.  We will work on arranging for a bronchoscopy to evaluate your left upper lobe mass. Goal will be to do to this 10/23/19.  You will need to stop your Eliquis 3 days prior to your procedure.  We may decide to start an inhaled medication for COPD at some point going forward.  Follow with Dr Lamonte Sakai in 1 month or next available.

## 2019-10-15 NOTE — Addendum Note (Signed)
Addended by: Suzzanne Cloud E on: 10/15/2019 03:08 PM   Modules accepted: Orders

## 2019-10-15 NOTE — Progress Notes (Signed)
Subjective:    Patient ID: Joe [name], male    DOB: 1945/12/07, 74 y.o.   MRN: [id number]  HPI 74 year old former smoker (88 pk-yrs) with a history of COPD, A Fib on Eliquis, stage I CLL 06/18/14), hypertension, GERD, hypothyroidism, OSA on CPAP.  Not currently on bronchodilator therapy He he had increased dyspnea, viral syndrome beginning in April, lasted over 6 weeks, was COVID negative.  Was treated with azithro and steroids in early June.  A chest x-ray done 08/23/2019 reviewed by me, showed a left upper lobe masslike lesion.  This prompted a CT chest 09/07/2019 which I reviewed. and which shows a large centrally necrotic 6.2 cm left upper lobe/hilar mass.  There are some small associated hilar mediastinal enlarged lymph nodes.   Breathing is better, no CP. His exertional tolerance is a bit limited - has some difficulty shopping, carrying groceries. He has seen exertional desats before. No wheeze, no chest tightness. Never coughs.    Review of Systems As per HPI  Past Medical History:  Diagnosis Date  . Alcohol abuse, daily use 08/24/2010   Stopped 17-Jun-2013   . Arthralgia 2014/01/01   9/15 Not related to statins OA   . Cataract   . Chronic lymphocytic leukemia ([place]) 07-18-2014   chronic stage 1- no symtoms  . CLL (chronic lymphocytic leukemia) ([place]) 08/08/2014   2014/06/18 Dr Burr Medico Stage 0  . CONJUNCTIVITIS, BACTERIAL, ACUTE 02/22/2009   Qualifier: Diagnosis of  By: Diona Browner MD, Amy    . COPD (chronic obstructive pulmonary disease) (HCC)    sats 93-97 % per pt.  [name] COPD mixed type ([place]) 07/26/2007   Smoker - stopped 6/14   . De Quervain's tenosynovitis, right 01-01-2014   2013-06-17   . DOE (dyspnea on exertion) 03/26/2014   1/16 deconditioning, COPD vs other   . Dysuria Jan 01, 2014   9/15 - poss stricture Urol ref was offered   . Gallstones 11/16/2017   Asymptomatic Pt refused surg ref  . Generalized anxiety disorder 09/07/2012   Chronic   Potential benefits of a long term steroid  use as well as  potential risks  and complications were explained to the patient and were aknowledged.     [name] GERD 12/02/2006   Chronic     . GERD (gastroesophageal reflux disease)   . Grief 06-02-2016   [name] died in 06/18/2014  . Gynecomastia 2014-01-01   Benign B 06/17/13   . Hyperlipidemia   . Hypertension   . Hypothyroidism 12/25/2014   Jun 18, 2014 On Levothyroxine   . Intertrigo 02/02/2012   11/13   . Irregular heart beat    benign per pt.  . Left hand pain 05/31/2017  . Neoplasm of uncertain behavior of skin 03/12/2013   12/14 R ear, chest   . Night sweats 03/12/2013   12/14   . OSA on CPAP    mild with AHI 9/hr and oxygen desats as low as 75%  . Pain of cheek 05/31/2017  . Palpitations 03/27/2013   Labs Atenolol prn  . 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 PERIORBITAL CELLULITIS 02/22/2009   Qualifier: Diagnosis of  By: Diona Browner MD, Amy    . 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 Shoulder pain 05/31/2017  . Thyroid disease   . Weight gain 05/23/2018   Try fasting Sharman Cheek "The Obesity Code"     Family History  Problem Relation Age of Onset  . COPD Mother   . Diabetes Father   .  Coronary artery disease Other   . Breast cancer Paternal Aunt 60  . Colon cancer Neg Hx      Social History   Socioeconomic History  . Marital status: Widowed    Spouse name: Not on file  . Number of children: 1  . Years of education: Not on file  . Highest education level: Not on file  Occupational History  . Occupation: retired  Tobacco Use  . Smoking status: Former Smoker    Packs/day: 0.80    Years: 50.00    Pack years: 40.00    Types: Cigarettes    Quit date: 08/20/2012    Years since quitting: 7.1  . Smokeless tobacco: Never Used  Vaping Use  . Vaping Use: Never used  Substance and Sexual Activity  . Alcohol use: Not Currently    Alcohol/week: 28.0 standard drinks    Types: 28 Cans of beer per week    Comment: he used to drink moderate to heavly for 45 years, quit one year ago   . Drug use: No  .  Sexual activity: Not Currently  Other Topics Concern  . Not on file  Social History Narrative  . Not on file   Social Determinants of Health   Financial Resource Strain:   . Difficulty of Paying Living Expenses:   Food Insecurity:   . Worried About Charity fundraiser in the Last Year:   . Arboriculturist in the Last Year:   Transportation Needs:   . Film/video editor (Medical):   [name] Lack of Transportation (Non-Medical):   Physical Activity: Inactive  . Days of Exercise per Week: 0 days  . Minutes of Exercise per Session: 0 min  Stress:   . Feeling of Stress :   Social Connections: Unknown  . Frequency of Communication with Friends and Family: Not on file  . Frequency of Social Gatherings with Friends and Family: Not on file  . Attends Religious Services: Not on file  . Active Member of Clubs or Organizations: Yes  . Attends Archivist Meetings: Not on file  . Marital Status: Not on file  Intimate Partner Violence:   . Fear of Current or Ex-Partner:   . Emotionally Abused:   [name] Physically Abused:   . 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 amLODipine (NORVASC) 5 MG tablet TAKE 1 TABLET BY MOUTH EVERY DAY 90 tablet 3  . apixaban (ELIQUIS) 5 MG TABS tablet Take 1 tablet (5 mg total) by mouth 2 (two) times daily. 180 tablet 3  . atenolol (TENORMIN) 25 MG tablet Take 1 tablet (25 mg total) by mouth daily as needed (for palpitations). 90 tablet 3  . Cholecalciferol (EQL VITAMIN D3) 1000 UNITS tablet Take 1,000 Units by mouth daily.      . diazepam (VALIUM) 5 MG tablet Take 1 tablet (5 mg total) by mouth every 12 (twelve) hours. (Patient taking differently: Take 5 mg by mouth daily. ) 180 tablet 1  . levothyroxine (SYNTHROID) 50 MCG tablet TAKE 1 TABLET BY MOUTH EVERY DAY 90 tablet 3  . lovastatin (MEVACOR) 20  MG tablet TAKE 1 TABLET BY MOUTH EVERY NIGHT AT BEDTIME 90 tablet 3  . Multiple Vitamin (MULTIVITAMIN) tablet Take 1 tablet by mouth daily. Centrum Silver.    [name] atenolol (TENORMIN)  50 MG tablet Take 0.5 tablets (25 mg total) by mouth daily as needed. For palpitations (Patient not taking: Reported on 05/28/2019) 30 tablet 3  . azithromycin (ZITHROMAX Z-PAK) 250 MG tablet As directed 6 tablet 0  . methylPREDNISolone (MEDROL DOSEPAK) 4 MG TBPK tablet As directed 21 tablet 0   No facility-administered medications prior to visit.        Objective:   Physical Exam Vitals:   10/15/19 1403  BP: 118/68  Pulse: 82  Temp: 98.3 F (36.8 C)  TempSrc: Oral  SpO2: 96%  Weight: 190 lb 12.8 oz (86.5 kg)  Height: 5\' 11"  (1.803 m)   Gen: Pleasant, elderly man, in no distress,  normal affect  ENT: No lesions,  mouth clear,  oropharynx clear, no postnasal drip  Neck: No JVD, no stridor  Lungs: No use of accessory muscles, distant, no wheeze, few left-sided inspiratory squeaks  Cardiovascular: RRR, heart sounds normal, no murmur or gallops, no peripheral edema  Musculoskeletal: No deformities, no cyanosis or clubbing  Neuro: alert, awake, non focal  Skin: Warm, no lesions or rash      Assessment & Plan:  Mass of upper lobe of left lung With associated mediastinal lymphadenopathy.  Consistent with malignancy.  I have recommended navigational bronchoscopy and endobronchial ultrasound to obtain a tissue diagnosis.  We will also need to push forward with the staging evaluation.  Hopefully I can arrange for 10/23/2019.  He will need to stop his Eliquis 3 days prior.  We will work on arranging for a bronchoscopy to evaluate your left upper lobe mass. Goal will be to do to this 10/23/19.  You will need to stop your Eliquis 3 days prior to your procedure.  We may decide to start an inhaled medication for COPD at some point going forward.  Follow with Dr Lamonte Sakai in 1 month or next available.   COPD mixed  type Suspect he does have COPD, especially based on the symptoms he had in the spring into June.  He benefited from steroids, azithromycin, consistent with an acute exacerbation.  We talked today about possibly starting empiric bronchodilators.  He wants to hold off for now.  I suspect we will need to perform pulmonary function testing and determine his degree of obstruction before he is going to want to start therapy.  OSA on CPAP Good compliance and good clinical benefit.  Baltazar Apo, MD, PhD 10/15/2019, 2:50 PM [facility] [phone] or if no answer [phone]

## 2019-10-15 NOTE — Addendum Note (Signed)
Addended by: Gavin Potters R on: 10/15/2019 03:13 PM   Modules accepted: Orders

## 2019-10-15 NOTE — Patient Instructions (Addendum)
We will work on arranging for a bronchoscopy to evaluate your left upper lobe mass. Goal will be to do to this 10/23/19.  You will need to stop your Eliquis 3 days prior to your procedure.  We may decide to start an inhaled medication for COPD at some point going forward.  Follow with Dr Lamonte Sakai in 1 month or next available.

## 2019-10-15 NOTE — Assessment & Plan Note (Signed)
Suspect he does have COPD, especially based on the symptoms he had in the spring into June.  He benefited from steroids, azithromycin, consistent with an acute exacerbation.  We talked today about possibly starting empiric bronchodilators.  He wants to hold off for now.  I suspect we will need to perform pulmonary function testing and determine his degree of obstruction before he is going to want to start therapy.

## 2019-10-15 NOTE — Assessment & Plan Note (Signed)
Good compliance and good clinical benefit.

## 2019-10-16 ENCOUNTER — Telehealth: Payer: Self-pay | Admitting: Emergency Medicine

## 2019-10-16 NOTE — Telephone Encounter (Signed)
*    Bronch is scheduled for 8/3 @ 7:30, pt to arrive by 5 AM for check in.  *  COIVD test is scheduled for 7/31 @ 11 @ [place] aware to stop Eliquis 3 days prior.    *  Ordered Super D Dimension CT disk from [place] - Per Wells Guiles, she is creating the disk & sending over to [place] ASAP

## 2019-10-16 NOTE — Telephone Encounter (Signed)
Noted thank you

## 2019-10-20 ENCOUNTER — Other Ambulatory Visit (HOSPITAL_COMMUNITY)
Admission: RE | Admit: 2019-10-20 | Discharge: 2019-10-20 | Disposition: A | Discharge status: Home / Self Care | Payer: Medicare Other | Source: Ambulatory Visit | Attending: Emergency Medicine | Admitting: Emergency Medicine

## 2019-10-20 DIAGNOSIS — Z20822 Contact with and (suspected) exposure to covid-19: Secondary | ICD-10-CM | POA: Diagnosis not present

## 2019-10-20 DIAGNOSIS — Z01818 Encounter for other preprocedural examination: Secondary | ICD-10-CM | POA: Diagnosis not present

## 2019-10-20 LAB — SARS CORONAVIRUS 2 (TAT 6-24 HRS): SARS Coronavirus 2: NEGATIVE

## 2019-10-22 ENCOUNTER — Other Ambulatory Visit: Payer: Self-pay

## 2019-10-22 ENCOUNTER — Encounter (HOSPITAL_COMMUNITY): Payer: Self-pay | Admitting: Emergency Medicine

## 2019-10-22 NOTE — Progress Notes (Signed)
Joe [name] denies chest pain or shortness of breath. Patient tested negative for Covid_7/31/21_ and has been in quarantine since that time.  Joe [name] has a history of PAF and rapid heart rate, patient is on Eliquis, last dose was 10/20/19 in the evening.  Joe [name] has a prescription for Atenolol prn for rapid  heart rate, patient has had prescription for 2 years and has taken 6 times. The last time Joe [name] took Atenol was approximately 10/01/19.

## 2019-10-22 NOTE — Progress Notes (Signed)
Anesthesia Chart Review: Same day workup  History of COPD, A Fib on Eliquis, stage I CLL (2016), hypertension, GERD, hypothyroidism, OSA on CPAP.    Last seen by cardiologist Dr. Radford Pax 05/28/2019.  Per note, episodes of paroxysmal atrial fibrillation are rare.  He continues on Eliquis 5 mg twice daily.  He was also noted to have 100% compliance with CPAP for treatment of OSA.  Cardiology is also following history of mild to moderate aortic insufficiency by echo November 2020.  Plan is to repeat November 2021.  He was advised follow-up appointment in 1 year.  Per Dr. Agustina Caroli note patient is to stop Eliquis 3 days prior to procedure.  C-Met and CBC from 10/15/2019 reviewed, unremarkable.  EKG 05/09/2019: Normal sinus rhythm.  Rate 74. Incomplete right bundle branch block. Possible Right ventricular hypertrophy.  No significant change.  TTE 02/13/2019: 1. Left ventricular ejection fraction, by visual estimation, is 60 to  65%. The left ventricle has normal function. There is moderately increased  left ventricular hypertrophy.  2. Left ventricular diastolic parameters are indeterminate.  3. Global right ventricle has normal systolic function.The right  ventricular size is normal.  4. Left atrial size was normal.  5. Right atrial size was normal.  6. The mitral valve is normal in structure. No evidence of mitral valve  regurgitation.  7. The tricuspid valve is not well visualized. Tricuspid valve  regurgitation is not demonstrated.  8. The aortic valve was not well visualized. Aortic valve regurgitation  is mild to moderate. No evidence of aortic valve sclerosis or stenosis.  9. The pulmonic valve was not well visualized. Pulmonic valve  regurgitation is not visualized.  10. The inferior vena cava is normal in size with greater than 50%  respiratory variability, suggesting right atrial pressure of 3 mmHg.    Wynonia Musty [facility]/Anesthesiology Phone [phone] 10/22/2019 10:43 AM

## 2019-10-22 NOTE — Anesthesia Preprocedure Evaluation (Addendum)
Anesthesia Evaluation  Patient identified by MRN, date of birth, ID band Patient awake    Reviewed: Allergy & Precautions, NPO status , Patient's Chart, lab work & pertinent test results, reviewed documented beta blocker date and time   History of Anesthesia Complications Negative for: history of anesthetic complications  Airway Mallampati: II  TM Distance: >3 FB Neck ROM: Full    Dental  (+) Edentulous Upper, Edentulous Lower   Pulmonary sleep apnea and Continuous Positive Airway Pressure Ventilation , COPD, former smoker,  Left lung mass   Pulmonary exam normal        Cardiovascular hypertension, Pt. on medications and Pt. on home beta blockers Normal cardiovascular exam+ dysrhythmias Atrial Fibrillation + Valvular Problems/Murmurs AI      Neuro/Psych Anxiety Depression negative neurological ROS     GI/Hepatic Neg liver ROS, GERD  ,  Endo/Other  Hypothyroidism   Renal/GU negative Renal ROS  negative genitourinary   Musculoskeletal negative musculoskeletal ROS (+)   Abdominal   Peds  Hematology CLL Eliquis   Anesthesia Other Findings  Echo 02/13/19: EF 60-65%, mod LVH, normal RV function, mild-mod AR  Last seen by cardiologist Dr. Radford Pax 05/28/2019.  Per note, episodes of paroxysmal atrial fibrillation are rare.  He continues on Eliquis 5 mg twice daily.  He was also noted to have 100% compliance with CPAP for treatment of OSA.  Cardiology is also following history of mild to moderate aortic insufficiency by echo November 2020.  Plan is to repeat November 2021.  He was advised follow-up appointment in 1 year.  Reproductive/Obstetrics                           Anesthesia Physical Anesthesia Plan  ASA: III  Anesthesia Plan: General   Post-op Pain Management:    Induction: Intravenous  PONV Risk Score and Plan: 2 and Ondansetron, Dexamethasone, Treatment may vary due to age or medical  condition and Midazolam  Airway Management Planned: Oral ETT  Additional Equipment: None  Intra-op Plan:   Post-operative Plan: Extubation in OR  Informed Consent: I have reviewed the patients History and Physical, chart, labs and discussed the procedure including the risks, benefits and alternatives for the proposed anesthesia with the patient or authorized representative who has indicated his/her understanding and acceptance.     Dental advisory given  Plan Discussed with:   Anesthesia Plan Comments: (PAT note by Karoline Caldwell, PA-C: History of COPD, A Fib on Eliquis, stage I CLL (2016), hypertension, GERD, hypothyroidism, OSA on CPAP.    Last seen by cardiologist Dr. Radford Pax 05/28/2019.  Per note, episodes of paroxysmal atrial fibrillation are rare.  He continues on Eliquis 5 mg twice daily.  He was also noted to have 100% compliance with CPAP for treatment of OSA.  Cardiology is also following history of mild to moderate aortic insufficiency by echo November 2020.  Plan is to repeat November 2021.  He was advised follow-up appointment in 1 year.  Per Dr. Agustina Caroli note patient is to stop Eliquis 3 days prior to procedure.  C-Met and CBC from 10/15/2019 reviewed, unremarkable.  EKG 05/09/2019: Normal sinus rhythm.  Rate 74. Incomplete right bundle branch block. Possible Right ventricular hypertrophy.  No significant change.  TTE 02/13/2019: 1. Left ventricular ejection fraction, by visual estimation, is 60 to  65%. The left ventricle has normal function. There is moderately increased  left ventricular hypertrophy.  2. Left ventricular diastolic parameters are indeterminate.  3. Global  right ventricle has normal systolic function.The right  ventricular size is normal.  4. Left atrial size was normal.  5. Right atrial size was normal.  6. The mitral valve is normal in structure. No evidence of mitral valve  regurgitation.  7. The tricuspid valve is not well visualized.  Tricuspid valve  regurgitation is not demonstrated.  8. The aortic valve was not well visualized. Aortic valve regurgitation  is mild to moderate. No evidence of aortic valve sclerosis or stenosis.  9. The pulmonic valve was not well visualized. Pulmonic valve  regurgitation is not visualized.  10. The inferior vena cava is normal in size with greater than 50%  respiratory variability, suggesting right atrial pressure of 3 mmHg.  )       Anesthesia Quick Evaluation

## 2019-10-23 ENCOUNTER — Other Ambulatory Visit: Payer: Self-pay

## 2019-10-23 ENCOUNTER — Encounter (HOSPITAL_COMMUNITY): Payer: Self-pay | Admitting: Emergency Medicine

## 2019-10-23 ENCOUNTER — Ambulatory Visit (HOSPITAL_COMMUNITY): Payer: Medicare Other

## 2019-10-23 ENCOUNTER — Ambulatory Visit (HOSPITAL_COMMUNITY): Payer: Medicare Other | Admitting: Physician Assistant

## 2019-10-23 ENCOUNTER — Ambulatory Visit (HOSPITAL_COMMUNITY)
Admission: RE | Admit: 2019-10-23 | Discharge: 2019-10-23 | Disposition: A | Discharge status: Home / Self Care | Payer: Medicare Other | Attending: Emergency Medicine | Admitting: Emergency Medicine

## 2019-10-23 ENCOUNTER — Encounter (HOSPITAL_COMMUNITY)
Admission: RE | Disposition: A | Discharge status: Home / Self Care | Payer: Self-pay | Source: Home / Self Care | Attending: Emergency Medicine

## 2019-10-23 DIAGNOSIS — E785 Hyperlipidemia, unspecified: Secondary | ICD-10-CM | POA: Diagnosis not present

## 2019-10-23 DIAGNOSIS — R590 Localized enlarged lymph nodes: Secondary | ICD-10-CM | POA: Diagnosis not present

## 2019-10-23 DIAGNOSIS — F17200 Nicotine dependence, unspecified, uncomplicated: Secondary | ICD-10-CM | POA: Diagnosis not present

## 2019-10-23 DIAGNOSIS — Z8249 Family history of ischemic heart disease and other diseases of the circulatory system: Secondary | ICD-10-CM | POA: Insufficient documentation

## 2019-10-23 DIAGNOSIS — Z9889 Other specified postprocedural states: Secondary | ICD-10-CM

## 2019-10-23 DIAGNOSIS — Z803 Family history of malignant neoplasm of breast: Secondary | ICD-10-CM | POA: Diagnosis not present

## 2019-10-23 DIAGNOSIS — F419 Anxiety disorder, unspecified: Secondary | ICD-10-CM | POA: Insufficient documentation

## 2019-10-23 DIAGNOSIS — G4733 Obstructive sleep apnea (adult) (pediatric): Secondary | ICD-10-CM | POA: Insufficient documentation

## 2019-10-23 DIAGNOSIS — Z825 Family history of asthma and other chronic lower respiratory diseases: Secondary | ICD-10-CM | POA: Diagnosis not present

## 2019-10-23 DIAGNOSIS — R918 Other nonspecific abnormal finding of lung field: Secondary | ICD-10-CM | POA: Diagnosis not present

## 2019-10-23 DIAGNOSIS — Z833 Family history of diabetes mellitus: Secondary | ICD-10-CM | POA: Diagnosis not present

## 2019-10-23 DIAGNOSIS — I480 Paroxysmal atrial fibrillation: Secondary | ICD-10-CM | POA: Diagnosis not present

## 2019-10-23 DIAGNOSIS — R846 Abnormal cytological findings in specimens from respiratory organs and thorax: Secondary | ICD-10-CM | POA: Diagnosis not present

## 2019-10-23 DIAGNOSIS — F329 Major depressive disorder, single episode, unspecified: Secondary | ICD-10-CM | POA: Insufficient documentation

## 2019-10-23 DIAGNOSIS — C9110 Chronic lymphocytic leukemia of B-cell type not having achieved remission: Secondary | ICD-10-CM | POA: Diagnosis not present

## 2019-10-23 DIAGNOSIS — J449 Chronic obstructive pulmonary disease, unspecified: Secondary | ICD-10-CM | POA: Diagnosis not present

## 2019-10-23 DIAGNOSIS — I10 Essential (primary) hypertension: Secondary | ICD-10-CM | POA: Diagnosis not present

## 2019-10-23 DIAGNOSIS — E039 Hypothyroidism, unspecified: Secondary | ICD-10-CM | POA: Insufficient documentation

## 2019-10-23 DIAGNOSIS — Z87891 Personal history of nicotine dependence: Secondary | ICD-10-CM | POA: Diagnosis not present

## 2019-10-23 DIAGNOSIS — K219 Gastro-esophageal reflux disease without esophagitis: Secondary | ICD-10-CM | POA: Diagnosis not present

## 2019-10-23 HISTORY — DX: Depression, unspecified: F32.A

## 2019-10-23 HISTORY — PX: BRONCHIAL NEEDLE ASPIRATION BIOPSY: SHX5106

## 2019-10-23 HISTORY — PX: VIDEO BRONCHOSCOPY WITH ENDOBRONCHIAL NAVIGATION: SHX6175

## 2019-10-23 HISTORY — PX: BRONCHIAL BRUSHINGS: SHX5108

## 2019-10-23 HISTORY — PX: BRONCHIAL BIOPSY: SHX5109

## 2019-10-23 HISTORY — DX: Cardiac arrhythmia, unspecified: I49.9

## 2019-10-23 HISTORY — PX: VIDEO BRONCHOSCOPY WITH ENDOBRONCHIAL ULTRASOUND: SHX6177

## 2019-10-23 SURGERY — VIDEO BRONCHOSCOPY WITH ENDOBRONCHIAL NAVIGATION
Anesthesia: General

## 2019-10-23 MED ORDER — CHLORHEXIDINE GLUCONATE 0.12 % MT SOLN
OROMUCOSAL | Status: AC
  Filled 2019-10-23: qty 15

## 2019-10-23 MED ORDER — DEXAMETHASONE SODIUM PHOSPHATE 10 MG/ML IJ SOLN
INTRAMUSCULAR | Status: DC | PRN
  Administered 2019-10-23: 10 mg via INTRAVENOUS

## 2019-10-23 MED ORDER — SUGAMMADEX SODIUM 200 MG/2ML IV SOLN
INTRAVENOUS | Status: DC | PRN
  Administered 2019-10-23: 200 mg via INTRAVENOUS
  Administered 2019-10-23: 100 mg via INTRAVENOUS

## 2019-10-23 MED ORDER — GLYCOPYRROLATE 0.2 MG/ML IJ SOLN
INTRAMUSCULAR | Status: DC | PRN
Start: 2019-10-23 — End: 2019-10-23
  Administered 2019-10-23: .2 mg via INTRAVENOUS

## 2019-10-23 MED ORDER — DIAZEPAM 5 MG PO TABS: 5.0000 mg | ORAL_TABLET | Freq: Every day | ORAL | Status: DC

## 2019-10-23 MED ORDER — APIXABAN 5 MG PO TABS: 5.0000 mg | ORAL_TABLET | Freq: Two times a day (BID) | ORAL | Status: DC

## 2019-10-23 MED ORDER — LIDOCAINE 2% (20 MG/ML) 5 ML SYRINGE
INTRAMUSCULAR | Status: DC | PRN
  Administered 2019-10-23: 60 mg via INTRAVENOUS

## 2019-10-23 MED ORDER — PROPOFOL 10 MG/ML IV BOLUS
INTRAVENOUS | Status: DC | PRN
  Administered 2019-10-23: 100 mg via INTRAVENOUS

## 2019-10-23 MED ORDER — FENTANYL CITRATE (PF) 250 MCG/5ML IJ SOLN
INTRAMUSCULAR | Status: DC | PRN
  Administered 2019-10-23 (×2): 50 ug via INTRAVENOUS

## 2019-10-23 MED ORDER — ROCURONIUM BROMIDE 10 MG/ML (PF) SYRINGE
PREFILLED_SYRINGE | INTRAVENOUS | Status: DC | PRN
  Administered 2019-10-23 (×2): 50 mg via INTRAVENOUS

## 2019-10-23 MED ORDER — PHENYLEPHRINE HCL-NACL 10-0.9 MG/250ML-% IV SOLN
INTRAVENOUS | Status: DC | PRN
  Administered 2019-10-23: 30 ug/min via INTRAVENOUS

## 2019-10-23 MED ORDER — ONDANSETRON HCL 4 MG/2ML IJ SOLN: 4.0000 mg | Freq: Once | INTRAMUSCULAR | Status: DC | PRN

## 2019-10-23 MED ORDER — LACTATED RINGERS IV SOLN: INTRAVENOUS | Status: DC

## 2019-10-23 MED ORDER — ONDANSETRON HCL 4 MG/2ML IJ SOLN
INTRAMUSCULAR | Status: DC | PRN
  Administered 2019-10-23: 4 mg via INTRAVENOUS

## 2019-10-23 NOTE — Interval H&P Note (Signed)
History and Physical Interval Note:  10/23/2019 7:27 AM  Joe [name]  has presented today for surgery, with the diagnosis of MASS OF LEFT LUNG.  The various methods of treatment have been discussed with the patient and family. After consideration of risks, benefits and other options for treatment, the patient has consented to  Procedure(s): VIDEO BRONCHOSCOPY WITH ENDOBRONCHIAL NAVIGATION (N/A) VIDEO BRONCHOSCOPY WITH ENDOBRONCHIAL ULTRASOUND (N/A) as a surgical intervention.  The patient's history has been reviewed, patient examined, no change in status, stable for surgery.  I have reviewed the patient's chart and labs.  Questions were answered to the patient's satisfaction.     [name]

## 2019-10-23 NOTE — Anesthesia Postprocedure Evaluation (Signed)
Anesthesia Post Note  Patient: Joe [name]  Procedure(s) Performed: VIDEO BRONCHOSCOPY WITH ENDOBRONCHIAL NAVIGATION (N/A ) VIDEO BRONCHOSCOPY WITH ENDOBRONCHIAL ULTRASOUND (N/A ) BRONCHIAL BIOPSIES BRONCHIAL BRUSHINGS BRONCHIAL NEEDLE ASPIRATION BIOPSIES     Patient location during evaluation: PACU Anesthesia Type: General Level of consciousness: awake and alert Pain management: pain level controlled Vital Signs Assessment: post-procedure vital signs reviewed and stable Respiratory status: spontaneous breathing, nonlabored ventilation and respiratory function stable Cardiovascular status: blood pressure returned to baseline and stable Postop Assessment: no apparent nausea or vomiting Anesthetic complications: no   No complications documented.  Last Vitals:  Vitals:   10/23/19 0939 10/23/19 0950  BP: (!) 106/59 (!) 127/59  Pulse: 61 65  Resp: 13 (!) 21  Temp:  36.7 C  SpO2: 100% 93%    Last Pain:  Vitals:   10/23/19 0950  TempSrc:   PainSc: 0-No pain                 Lidia Collum

## 2019-10-23 NOTE — Transfer of Care (Signed)
Immediate Anesthesia Transfer of Care Note  Patient: Joe [name]  Procedure(s) Performed: VIDEO BRONCHOSCOPY WITH ENDOBRONCHIAL NAVIGATION (N/A ) VIDEO BRONCHOSCOPY WITH ENDOBRONCHIAL ULTRASOUND (N/A ) BRONCHIAL BIOPSIES BRONCHIAL BRUSHINGS BRONCHIAL NEEDLE ASPIRATION BIOPSIES  Patient Location: PACU  Anesthesia Type:General  Level of Consciousness: awake, alert  and oriented  Airway & Oxygen Therapy: Patient Spontanous Breathing and Patient connected to face mask oxygen  Post-op Assessment: Report given to RN, Post -op Vital signs reviewed and stable and Patient moving all extremities X 4  Post vital signs: Reviewed and stable  Last Vitals:  Vitals Value Taken Time  BP 127/70 10/23/19 0924  Temp 36.7 C 10/23/19 0924  Pulse 66 10/23/19 0933  Resp 17 10/23/19 0933  SpO2 91 % 10/23/19 0933  Vitals shown include unvalidated device data.  Last Pain:  Vitals:   10/23/19 0924  TempSrc:   PainSc: Asleep      Patients Stated Pain Goal: 3 (11/00/34 9611)  Complications: No complications documented.

## 2019-10-23 NOTE — Anesthesia Procedure Notes (Signed)
Procedure Name: Intubation Date/Time: 10/23/2019 7:48 AM Performed by: Mariea Clonts, CRNA Pre-anesthesia Checklist: Patient identified, Emergency Drugs available, Suction available and Patient being monitored Patient Re-evaluated:Patient Re-evaluated prior to induction Oxygen Delivery Method: Circle System Utilized Preoxygenation: Pre-oxygenation with 100% oxygen Induction Type: IV induction Ventilation: Mask ventilation without difficulty Laryngoscope Size: Mac and 4 Grade View: Grade I Tube type: Oral Tube size: 8.5 mm Number of attempts: 1 Airway Equipment and Method: Stylet and Oral airway Placement Confirmation: ETT inserted through vocal cords under direct vision,  positive ETCO2 and breath sounds checked- equal and bilateral Tube secured with: Tape Dental Injury: Teeth and Oropharynx as per pre-operative assessment

## 2019-10-23 NOTE — Op Note (Signed)
Video Bronchoscopy with Electromagnetic Navigation and Endobronchial Ultrasound Procedure Note  Date of Operation: 10/23/2019  Pre-op Diagnosis: Left upper lobe mass, mediastinal lymphadenopathy  Post-op Diagnosis: Same  Surgeon: Baltazar Apo  Assistants: None  Anesthesia: General endotracheal anesthesia  Operation: Flexible video fiberoptic bronchoscopy with electromagnetic navigation and biopsies.  Estimated Blood Loss: Minimal  Complications: None apparent  Indications and History: Joe [name] is a 74 y.o. male with history tobacco use.  He was found to have a left upper lobe opacity on chest x-ray.  Prompted CT chest 09/07/2019 that showed a large centrally necrotic 2.2 cm left upper lobe hilar mass with some associated mediastinal lymphadenopathy.  Recommendation was made to seek a tissue diagnosis via navigational bronchoscopy and endobronchial ultrasound with biopsies.  The risks, benefits, complications, treatment options and expected outcomes were discussed with the patient.  The possibilities of pneumothorax, pneumonia, reaction to medication, pulmonary aspiration, perforation of a viscus, bleeding, failure to diagnose a condition and creating a complication requiring transfusion or operation were discussed with the patient who freely signed the consent.    Description of Procedure: The patient was seen in the Preoperative Area, was examined and was deemed appropriate to proceed.  The patient was taken to [facility] endoscopy room 2, identified as Joe [name] and the procedure verified as Flexible Video Fiberoptic Bronchoscopy.  A Time Out was held and the above information confirmed.   Prior to the date of the procedure a high-resolution CT scan of the chest was performed. Utilizing [place] a virtual tracheobronchial tree was generated to allow the creation of distinct navigation pathways to the patient's parenchymal abnormalities. After being taken to the  operating room general anesthesia was initiated and the patient  was orally intubated. The video fiberoptic bronchoscope was introduced via the endotracheal tube and a general inspection was performed which showed normal airways on the right.  There was erythema and hypervascular change on the mucosa from the left upper lobe apical and medial segmental airways.  There was no definite raised endobronchial lesion seen. The extendable working channel and locator guide were introduced into the bronchoscope. The distinct navigation pathway prepared prior to this procedure were then utilized to navigate to within 0.2 cm of patient's lesion identified on CT scan. The extendable working channel was secured into place and the locator guide was withdrawn. Under fluoroscopic guidance transbronchial needle brushings, transbronchial Wang needle biopsies, and transbronchial forceps biopsies were performed to be sent for cytology and pathology.  The standard scope was then withdrawn and the endobronchial ultrasound was used to identify and characterize the peritracheal, hilar and bronchial lymph nodes. Inspection showed enlarged nodes at station 4L, 7, 11 R. Using real-time ultrasound guidance Wang needle biopsies were take from Station 4L, 7, 11 R nodes and were sent for cytology.   At the end of the procedure a general airway inspection was performed and there was no evidence of active bleeding. The bronchoscope was removed.  The patient tolerated the procedure well. There was no significant blood loss and there were no obvious complications. A post-procedural chest x-ray is pending.  Samples: 1. Transbronchial needle brushings from left upper lobe mass 2. Transbronchial Wang needle biopsies from left upper lobe mass 3. Transbronchial forceps biopsies from left upper lobe mass 4. Wang needle biopsies from 4L node 5. Wang needle biopsies from 7 node 6. Wang needle biopsies from 11 R node   Plans:  The patient will  be discharged from the PACU to home  when recovered from anesthesia and after chest x-ray is reviewed. We will review the cytology, pathology and microbiology results with the patient when they become available. Outpatient followup will be with Dr. Lamonte Sakai.    Baltazar Apo, MD, PhD 10/23/2019, 9:20 AM [facility] [phone] or if no answer [phone]

## 2019-10-23 NOTE — Discharge Instructions (Signed)
Flexible Bronchoscopy, Care After This sheet gives you information about how to care for yourself after your test. Your doctor may also give you more specific instructions. If you have problems or questions, contact your doctor. Follow these instructions at home: Eating and drinking  Do not eat or drink anything (not even water) for 2 hours after your test, or until your numbing medicine (local anesthetic) wears off.  When your numbness is gone and your cough and gag reflexes have come back, you may: ? Eat only soft foods. ? Slowly drink liquids.  The day after the test, go back to your normal diet. Driving  Do not drive for 24 hours if you were given a medicine to help you relax (sedative).  Do not drive or use heavy machinery while taking prescription pain medicine. General instructions   Take over-the-counter and prescription medicines only as told by your doctor.  Return to your normal activities as told. Ask what activities are safe for you.  Do not use any products that have nicotine or tobacco in them. This includes cigarettes and e-cigarettes. If you need help quitting, ask your doctor.  Keep all follow-up visits as told by your doctor. This is important. It is very important if you had a tissue sample (biopsy) taken. Get help right away if:  You have shortness of breath that gets worse.  You get light-headed.  You feel like you are going to pass out (faint).  You have chest pain.  You cough up: ? More than a little blood. ? More blood than before. Summary  Do not eat or drink anything (not even water) for 2 hours after your test, or until your numbing medicine wears off.  Do not use cigarettes. Do not use e-cigarettes.  Get help right away if you have chest pain.   Okay to restart your Eliquis on Thursday, 10/25/2019.  Please call our office for any questions or concerns.  [phone].  This information is not intended to replace advice given to you by  your health care provider. Make sure you discuss any questions you have with your health care provider. Document Revised: 02/18/2017 Document Reviewed: 03/26/2016 Elsevier Patient Education  2020 [employer].

## 2019-10-24 LAB — CYTOLOGY - NON PAP

## 2019-10-25 LAB — CYTOLOGY - NON PAP

## 2019-10-25 LAB — SURGICAL PATHOLOGY

## 2019-10-26 ENCOUNTER — Telehealth: Payer: Self-pay | Admitting: Emergency Medicine

## 2019-10-26 NOTE — Telephone Encounter (Signed)
Reviewed data with the patient: His LUL brushings show atypical cells.  Left upper lobe transbronchial biopsies show no clear malignancy but crush artifact consistent with lymphocytes (history CLL, question at risk lymphoma).  He had nodal biopsies at station 4L, 11 R, 7 that showed lymphocytes.  No flow cytometry was requested.  At this point he likely needs either a repeat biopsy or consideration for primary resection. I talked to him about the pros and cons of each strategy.  I will go ahead and order PFT, PET scan.  I will discuss his case at thoracic oncology conference next Thursday to help guide next steps.  I suspect that he will need either repeat bronchoscopy or transthoracic needle biopsy before we talk about treatment even possible resection.  That being said he may be a surgical candidate now and I will consult with TCTS. Plan to revisit with him next week after the conference.

## 2019-11-01 ENCOUNTER — Other Ambulatory Visit: Payer: Self-pay

## 2019-11-01 ENCOUNTER — Other Ambulatory Visit: Payer: Self-pay | Admitting: *Deleted

## 2019-11-01 DIAGNOSIS — R918 Other nonspecific abnormal finding of lung field: Secondary | ICD-10-CM

## 2019-11-01 NOTE — Telephone Encounter (Signed)
Discussed the plans with Mr. Stults today.  Recommendation is for PET scan and then strategize for repeat biopsy.  We will get the PET scan soon as possible.

## 2019-11-01 NOTE — Progress Notes (Signed)
The proposed treatment discussed in cancer conference 11/01/19 is for discussion purpose only and not a binding recommendation.  The patient was not physically examined nor present for their treatment options.  Therefore, final treatment plans cannot be decided.   I will update Dr. Burr Medico on recommendations.

## 2019-11-01 NOTE — Telephone Encounter (Signed)
Reviewed pt's data at Thoracic conference. 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 Recommended PET scan and then discuss modality for repeat biopsy depending on results.

## 2019-11-05 ENCOUNTER — Other Ambulatory Visit: Payer: Self-pay

## 2019-11-05 ENCOUNTER — Ambulatory Visit (HOSPITAL_COMMUNITY)
Admission: RE | Admit: 2019-11-05 | Discharge: 2019-11-05 | Disposition: A | Discharge status: Home / Self Care | Payer: Medicare Other | Source: Ambulatory Visit | Attending: Physician Assistant | Admitting: Physician Assistant

## 2019-11-05 VITALS — BP 128/66 | HR 66 | Ht 71.0 in | Wt 190.8 lb

## 2019-11-05 DIAGNOSIS — C9110 Chronic lymphocytic leukemia of B-cell type not having achieved remission: Secondary | ICD-10-CM | POA: Diagnosis not present

## 2019-11-05 DIAGNOSIS — R918 Other nonspecific abnormal finding of lung field: Secondary | ICD-10-CM | POA: Insufficient documentation

## 2019-11-05 DIAGNOSIS — Z7901 Long term (current) use of anticoagulants: Secondary | ICD-10-CM | POA: Insufficient documentation

## 2019-11-05 DIAGNOSIS — Z87891 Personal history of nicotine dependence: Secondary | ICD-10-CM | POA: Insufficient documentation

## 2019-11-05 DIAGNOSIS — E039 Hypothyroidism, unspecified: Secondary | ICD-10-CM | POA: Insufficient documentation

## 2019-11-05 DIAGNOSIS — D6869 Other thrombophilia: Secondary | ICD-10-CM | POA: Diagnosis not present

## 2019-11-05 DIAGNOSIS — G4733 Obstructive sleep apnea (adult) (pediatric): Secondary | ICD-10-CM | POA: Diagnosis not present

## 2019-11-05 DIAGNOSIS — K219 Gastro-esophageal reflux disease without esophagitis: Secondary | ICD-10-CM | POA: Insufficient documentation

## 2019-11-05 DIAGNOSIS — I480 Paroxysmal atrial fibrillation: Secondary | ICD-10-CM | POA: Insufficient documentation

## 2019-11-05 DIAGNOSIS — Z79890 Hormone replacement therapy (postmenopausal): Secondary | ICD-10-CM | POA: Insufficient documentation

## 2019-11-05 DIAGNOSIS — E785 Hyperlipidemia, unspecified: Secondary | ICD-10-CM | POA: Insufficient documentation

## 2019-11-05 DIAGNOSIS — J449 Chronic obstructive pulmonary disease, unspecified: Secondary | ICD-10-CM | POA: Insufficient documentation

## 2019-11-05 DIAGNOSIS — I10 Essential (primary) hypertension: Secondary | ICD-10-CM | POA: Diagnosis not present

## 2019-11-05 DIAGNOSIS — Z79899 Other long term (current) drug therapy: Secondary | ICD-10-CM | POA: Insufficient documentation

## 2019-11-05 NOTE — Progress Notes (Signed)
Primary Care Physician: Cassandria Anger, MD Primary Cardiologist: Dr Radford Pax Primary Electrophysiologist: none Referring Physician: Dr Earnestine Mealing is a 74 y.o. male with a history of HTN, CLL, COPD, HLD, hypothyroidism, and paroxysmal atrial fibrillation who presents for follow up in the [facility].  The patient was initially diagnosed with atrial fibrillation after presenting with symptoms of palpitations and heart racing which have been sporadic over the last year. An event monitor was placed which showed a 1 minute episode of afib with RVR. He was started on Eliquis and referred to the [facility] Clinic. He denies significant alcohol use or snoring. He does note frequent nocturnal waking and hypoxia, sleep study ordered by Dr Radford Pax. Patient's event monitor showed 2% afib burden with the majority of episodes being rate controlled. He has also been diagnosed with OSA.  On follow up today, patient reports he has done well from a cardiac standpoint. He did have one episode of heart racing and took an atenolol which resolved his symptoms. He did have a CXR 08/23/19 which showed a lesion in his left upper lobe. Chest CT showed a large mass with associated hilar lymphadenopathy.   Today, he denies symptoms of chest pain, shortness of breath, orthopnea, PND, lower extremity edema, dizziness, presyncope, syncope, snoring, daytime somnolence, bleeding, or neurologic sequela. The patient is tolerating medications without difficulties and is otherwise without complaint today.    Atrial Fibrillation Risk Factors:  he does have symptoms or diagnosis of sleep apnea. He is compliant with CPAP therapy.  he does not have a history of rheumatic fever. he does not have a history of alcohol use.  he has a BMI of Body mass index is 26.61 kg/m.[name] Filed Weights   11/05/19 0933  Weight: 86.5 kg    Family History  Problem Relation Age of Onset  . COPD Mother   .  Diabetes Father   . Coronary artery disease Other   . Breast cancer Paternal Aunt 45  . Colon cancer Neg Hx      Atrial Fibrillation Management history:  Previous antiarrhythmic drugs: none Previous cardioversions: none Previous ablations: none CHADS2VASC score: 2 Anticoagulation history: Eliquis   Past Medical History:  Diagnosis Date  . Alcohol abuse, daily use 08/24/2010   Stopped 2015   . Arthralgia 12/13/2013   9/15 Not related to statins OA   . Cataract   . 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 CLL (chronic lymphocytic leukemia) ([place]) 08/08/2014   2016 Dr Burr Medico Stage 0  . CONJUNCTIVITIS, BACTERIAL, ACUTE 02/22/2009   Qualifier: Diagnosis of  By: Diona Browner MD, Amy    . COPD (chronic obstructive pulmonary disease) (HCC)    sats 93-97 % per pt.  [name] COPD mixed type ([place]) 07/26/2007   Smoker - stopped 6/14   . De Quervain's tenosynovitis, right 12/13/2013   2015   . Depression    at times  . DOE (dyspnea on exertion) 03/26/2014   1/16 deconditioning, COPD vs other   . Dysrhythmia    PAF  . Dysuria 12/13/2013   9/15 - poss stricture Urol ref was offered   . Gallstones 11/16/2017   Asymptomatic Pt refused surg ref  . Generalized anxiety disorder 09/07/2012   Chronic   Potential benefits of a long term steroid  use as well as potential risks  and complications were explained to the patient and were aknowledged.     [name]  GERD 12/02/2006   Chronic     . GERD (gastroesophageal reflux disease)   . Grief June 10, 2016   [name] died in 2014/06/26  . Gynecomastia 2014-01-09   Benign B 25-Jun-2013   . Hyperlipidemia   . Hypertension   . Hypothyroidism 12/25/2014   06/26/14 On Levothyroxine   . Intertrigo 02/02/2012   11/13   . Irregular heart beat    benign per pt.  . Left hand pain 05/31/2017  . Neoplasm of uncertain behavior of skin 03/12/2013   12/14 R ear, chest   . Night sweats 03/12/2013   12/14   . OSA on CPAP    mild with AHI 9/hr and oxygen desats as low  as 75%  . Pain of cheek 05/31/2017  . Palpitations 03/27/2013   Labs Atenolol prn  . 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 PERIORBITAL CELLULITIS 02/22/2009   Qualifier: Diagnosis of  By: Diona Browner MD, Amy    . 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 Shoulder pain 05/31/2017  . Thyroid disease   . Weight gain 05/23/2018   Try fasting Sharman Cheek "The Obesity Code"   Past Surgical History:  Procedure Laterality Date  . BICEPS TENDON REPAIR    . BRONCHIAL BIOPSY  10/23/2019   Procedure: BRONCHIAL BIOPSIES;  Surgeon: [name], MD;  Location: [facility] ENDOSCOPY;  Service: Pulmonary;;  . BRONCHIAL BRUSHINGS  10/23/2019   Procedure: BRONCHIAL BRUSHINGS;  Surgeon: [name], MD;  Location: [facility] ENDOSCOPY;  Service: Pulmonary;;  . BRONCHIAL NEEDLE ASPIRATION BIOPSY  10/23/2019   Procedure: BRONCHIAL NEEDLE ASPIRATION BIOPSIES;  Surgeon: [name], MD;  Location: [facility] ENDOSCOPY;  Service: Pulmonary;;  . CATARACT EXTRACTION Left   . COLONOSCOPY  04-14-99   Dr Flossie Dibble polyp-TA in epic  . POLYPECTOMY  04-14-99  . POSTERIOR LAMINECTOMY / DECOMPRESSION CERVICAL SPINE     Dr Saintclair Halsted  . RETINAL DETACHMENT SURGERY     left eye, 06/25/05 x2, 2006-06-26 x 3  . ROTATOR CUFF REPAIR  26-Jun-2002   right  . TONSILLECTOMY  [id number]  . VIDEO BRONCHOSCOPY WITH ENDOBRONCHIAL NAVIGATION N/A 10/23/2019   Procedure: VIDEO BRONCHOSCOPY WITH ENDOBRONCHIAL NAVIGATION;  Surgeon: [name], MD;  Location: [place] ENDOSCOPY;  Service: Pulmonary;  Laterality: N/A;  . VIDEO BRONCHOSCOPY WITH ENDOBRONCHIAL ULTRASOUND N/A 10/23/2019   Procedure: VIDEO BRONCHOSCOPY WITH ENDOBRONCHIAL ULTRASOUND;  Surgeon: [name], MD;  Location: [place] ENDOSCOPY;  Service: Pulmonary;  Laterality: N/A;    Current Outpatient Medications  Medication Sig Dispense Refill  . amLODipine (NORVASC) 5 MG tablet TAKE 1 TABLET BY MOUTH EVERY DAY (Patient taking differently: Take 5 mg by mouth daily. Midday) 90 tablet 3  . apixaban (ELIQUIS) 5 MG TABS tablet Take 1  tablet (5 mg total) by mouth 2 (two) times daily. 09:30 am Okay to restart this medication on 10/25/2019    . atenolol (TENORMIN) 25 MG tablet Take 1 tablet (25 mg total) by mouth daily as needed (for palpitations). 90 tablet 3  . Cholecalciferol (EQL VITAMIN D3) 1000 UNITS tablet Take 1,000 Units by mouth daily.      . diazepam (VALIUM) 5 MG tablet Take 1 tablet (5 mg total) by mouth daily.    [name] levothyroxine (SYNTHROID) 50 MCG tablet TAKE 1 TABLET BY MOUTH EVERY DAY (Patient taking differently: Take 50 mcg by mouth daily before breakfast. ) 90 tablet 3  . lovastatin (MEVACOR) 20 MG tablet TAKE 1 TABLET BY MOUTH EVERY NIGHT AT BEDTIME (Patient  taking differently: Take 20 mg by mouth at bedtime. ) 90 tablet 3  . Multiple Vitamin (MULTIVITAMIN) tablet Take 1 tablet by mouth daily. Centrum Silver.    Vladimir Faster Glycol-Propyl Glycol (SYSTANE) 0.4-0.3 % SOLN Place 1 drop into both eyes daily as needed (Dry eyes). Ultra     No current facility-administered medications for this encounter.    No Known Allergies  Social History   Socioeconomic History  . Marital status: Widowed    Spouse name: Not on file  . Number of children: 1  . Years of education: Not on file  . Highest education level: Not on file  Occupational History  . Occupation: retired  Tobacco Use  . Smoking status: Former Smoker    Packs/day: 0.80    Years: 50.00    Pack years: 40.00    Types: Cigarettes    Quit date: 08/27/2012    Years since quitting: 7.1  . Smokeless tobacco: Never Used  Vaping Use  . Vaping Use: Never used  Substance and Sexual Activity  . Alcohol use: Not Currently  . Drug use: No  . Sexual activity: Not Currently  Other Topics Concern  . Not on file  Social History Narrative  . Not on file   Social Determinants of Health   Financial Resource Strain:   . Difficulty of Paying Living Expenses:   Food Insecurity:   . Worried About Charity fundraiser in the Last Year:   . Arboriculturist in the  Last Year:   Transportation Needs:   . Film/video editor (Medical):   [name] Lack of Transportation (Non-Medical):   Physical Activity: Inactive  . Days of Exercise per Week: 0 days  . Minutes of Exercise per Session: 0 min  Stress:   . Feeling of Stress :   Social Connections: Unknown  . Frequency of Communication with Friends and Family: Not on file  . Frequency of Social Gatherings with Friends and Family: Not on file  . Attends Religious Services: Not on file  . Active Member of Clubs or Organizations: Yes  . Attends Archivist Meetings: Not on file  . Marital Status: Not on file  Intimate Partner Violence:   . Fear of Current or Ex-Partner:   . Emotionally Abused:   [name] Physically Abused:   . Sexually Abused:      ROS- All systems are reviewed and negative except as per the HPI above.  Physical Exam: Vitals:   11/05/19 0933  BP: 128/66  Pulse: 66  Weight: 86.5 kg  Height: 5\' 11"  (1.803 m)    GEN- The patient is well appearing, alert and oriented x 3 today.   HEENT-head normocephalic, atraumatic, sclera clear, conjunctiva pink, hearing intact, trachea midline. Lungs- Clear to ausculation bilaterally, normal work of breathing Heart- Regular rate and rhythm, no murmurs, rubs or gallops  GI- soft, NT, ND, + BS Extremities- no clubbing, cyanosis, or edema MS- no significant deformity or atrophy Skin- no rash or lesion Psych- euthymic mood, full affect Neuro- strength and sensation are intact   Wt Readings from Last 3 Encounters:  11/05/19 86.5 kg  10/23/19 86.2 kg  10/15/19 86.5 kg    EKG from today demonstrates SR HR 66, PR 170, QRS 90, QTc 438  Echo 02/13/19 demonstrated 1. Left ventricular ejection fraction, by visual estimation, is 60 to  65%. The left ventricle has normal function. There is moderately increased  left ventricular hypertrophy.  2. Left ventricular diastolic parameters  are indeterminate.  3. Global right ventricle has normal  systolic function.The right  ventricular size is normal.  4. Left atrial size was normal.  5. Right atrial size was normal.  6. The mitral valve is normal in structure. No evidence of mitral valve  regurgitation.  7. The tricuspid valve is not well visualized. Tricuspid valve  regurgitation is not demonstrated.  8. The aortic valve was not well visualized. Aortic valve regurgitation  is mild to moderate. No evidence of aortic valve sclerosis or stenosis.  9. The pulmonic valve was not well visualized. Pulmonic valve  regurgitation is not visualized.  10. The inferior vena cava is normal in size with greater than 50%  respiratory variability, suggesting right atrial pressure of 3 mmHg.   Epic records are reviewed at length today  Assessment and Plan:  1. Paroxysmal atrial fibrillation Patient appears to be maintaining SR. Continue Eliquis 5 mg BID.  Continue atenolol to 25 mg daily PRN for heart racing   This patients CHA2DS2-VASc Score and unadjusted Ischemic Stroke Rate (% per year) is equal to 2.2 % stroke rate/year from a score of 2  Above score calculated as 1 point each if present [CHF, HTN, DM, Vascular=MI/PAD/Aortic Plaque, Age if 65-74, or Male] Above score calculated as 2 points each if present [Age > 75, or Stroke/TIA/TE]  2. HTN Stable, no changes today.  3. OSA Patient reports compliance with CPAP therapy.  4. 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 Plans per pulmonary medicine.   Follow up in the AF clinic in 6 months.    [facility] [address] [place], [place] [address] [phone] 11/05/2019 10:18 AM

## 2019-11-07 ENCOUNTER — Telehealth: Payer: Self-pay | Admitting: *Deleted

## 2019-11-07 DIAGNOSIS — R918 Other nonspecific abnormal finding of lung field: Secondary | ICD-10-CM

## 2019-11-07 NOTE — Telephone Encounter (Signed)
I called and spoke with Mr. Stillinger.  I scheduled him to see Cassie next week after his PET per Dr. Julien Nordmann. He verbalized understanding of appt.

## 2019-11-09 ENCOUNTER — Ambulatory Visit (HOSPITAL_COMMUNITY)
Admission: RE | Admit: 2019-11-09 | Discharge: 2019-11-09 | Disposition: A | Discharge status: Home / Self Care | Payer: Medicare Other | Source: Ambulatory Visit | Attending: Emergency Medicine | Admitting: Emergency Medicine

## 2019-11-09 ENCOUNTER — Encounter: Payer: Self-pay | Admitting: *Deleted

## 2019-11-09 ENCOUNTER — Other Ambulatory Visit: Payer: Self-pay

## 2019-11-09 DIAGNOSIS — K8020 Calculus of gallbladder without cholecystitis without obstruction: Secondary | ICD-10-CM | POA: Insufficient documentation

## 2019-11-09 DIAGNOSIS — K760 Fatty (change of) liver, not elsewhere classified: Secondary | ICD-10-CM | POA: Insufficient documentation

## 2019-11-09 DIAGNOSIS — R918 Other nonspecific abnormal finding of lung field: Secondary | ICD-10-CM | POA: Diagnosis not present

## 2019-11-09 DIAGNOSIS — J439 Emphysema, unspecified: Secondary | ICD-10-CM | POA: Diagnosis not present

## 2019-11-09 DIAGNOSIS — I700 Atherosclerosis of aorta: Secondary | ICD-10-CM | POA: Insufficient documentation

## 2019-11-09 LAB — GLUCOSE, CAPILLARY: Glucose-Capillary: 86 mg/dL (ref 70–99)

## 2019-11-09 MED ORDER — FLUDEOXYGLUCOSE F - 18 (FDG) INJECTION
9.7000 | Freq: Once | INTRAVENOUS | Status: AC | PRN
  Administered 2019-11-09: 9.7 via INTRAVENOUS

## 2019-11-09 NOTE — Progress Notes (Signed)
Oncology Nurse Navigator Documentation  Oncology Nurse Navigator Flowsheets 11/09/2019  Abnormal Finding Date 08/23/2019  Diagnosis Status Additional Work Up  Navigator Follow Up Date: 11/14/2019  Navigator Follow Up Reason: New Patient Appointment  Navigator Location [facility]  Navigator Encounter Type Other:  Treatment Phase Abnormal Scans  Barriers/Navigation Needs Coordination of Care  Interventions Coordination of Care;Psycho-Social Support  Acuity Level 2-Minimal Needs (1-2 Barriers Identified)  Coordination of Care Other  Time Spent with Patient 45

## 2019-11-13 NOTE — Progress Notes (Addendum)
[place] [phone]   [phone]  CONSULT NOTE  REFERRING PHYSICIAN: Dr. Lamonte Sakai  REASON FOR CONSULTATION:  Left Upper Lobe Lung Mass  HPI Joe [name] is a 74 y.o. male with a past medical history significant for stage 0 CLL, hypothyroidism, obstructive sleep apnea, COPD, atrial fibrillation/atrial flutter, hyper tension, dyslipidemia, retinal detachment, generalized anxiety disorder, and De Quervain's tenosynovitis is referred to the clinic for evaluation of a left upper lobe masslike lesion.  On July 11, 2019, the patient started noticing new onset of symptoms with dyspnea on exertion as well as diarrhea.  He later went on to develop associated fever, chills, and hoarseness.   He had a pulse ox  at home which read his oxygen saturation as being in the 80%'s.  He had a Covid test which was negative.  He is called his primary care provider who arrange for chest x-ray to be performed.  The patient had a chest x-ray performed on 08/23/2019 which noted a left upper lobe masslike lesion. He subsequently had a CT scan of the chest on 09/07/2019 which showed a 6.2 large centrally necrotic left upper lobe/hilar mass with associated hilar and mediastinal lymphadenopathy.  The patient was seen by pulmonologist, Dr. Lamonte Sakai on 10/15/2019 for recommendations regarding these abnormal imaging studies.   The patient had a bronchoscopy performed by Dr. Lamonte Sakai on 10/26/2019 which the pathology was consistent with atypical cells in the left upper lobe with lymphocytes. He is scheduled to see Dr. Lamonte Sakai again on 12/05/2019.   The patient had a PET scan performed on 11/09/2019 which showed left upper lobe mass and mediastinal lymphadenopathy there was also mild contralateral hilar uptake in the right hilar region and mild uptake in the lumbar spine.  Today, the patient is feeling fairly well.  He denies any shortness of breath, cough, or hemoptysis. He  sometimes feels left-sided  chest soreness if he lays in a particular position.  He denies any fever, chills, or night sweats.  He reports that he has been losing weight which he attributes to being anxious recently due to the workup for his current condition.  He denies any nausea, vomiting, or diarrhea.  He reports some constipation/hard stools related to his diet.  He denies any headache or visual changes.  His family history consists of a mother who had COPD.  The patient's father had diabetes as well as valvular disease.  The patient denies any other family history of malignancy except for a paternal aunt who had breast cancer.  The patient used to work as a Company secretary.  He also worked in [employer] as well as working at [employer] in [employer].  The patient is widowed.  He has 1 son.  He denies any history of drug use.  He does not drink any alcohol at this point in his life.  He used to smoke for approximately 50 years averaging 1-1 and half packs per day.  He quit smoking in 2014.  HPI  Past Medical History:  Diagnosis Date  . Alcohol abuse, daily use 08/24/2010   Stopped 2015   . Arthralgia 12/13/2013   9/15 Not related to statins OA   . Cataract   . 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 CLL (chronic lymphocytic leukemia) ([place]) 08/08/2014   2016 Dr Burr Medico Stage 0  . CONJUNCTIVITIS, BACTERIAL, ACUTE 02/22/2009   Qualifier: Diagnosis of  By: Diona Browner MD, Amy    .  COPD (chronic obstructive pulmonary disease) (HCC)    sats 93-97 % per pt.  [name] COPD mixed type ([place]) 07/26/2007   Smoker - stopped 6/14   . De Quervain's tenosynovitis, right Jan 04, 2014   06/20/13   . Depression    at times  . DOE (dyspnea on exertion) 03/26/2014   1/16 deconditioning, COPD vs other   . Dysrhythmia    PAF  . Dysuria 01/04/2014   9/15 - poss stricture Urol ref was offered   . Gallstones 11/16/2017   Asymptomatic Pt refused surg ref  . Generalized anxiety disorder 09/07/2012   Chronic    Potential benefits of a long term steroid  use as well as potential risks  and complications were explained to the patient and were aknowledged.     [name] GERD 12/02/2006   Chronic     . GERD (gastroesophageal reflux disease)   . Grief 06/05/2016   [name] died in 06/21/14  . Gynecomastia 01-04-14   Benign B 06-20-2013   . Hyperlipidemia   . Hypertension   . Hypothyroidism 12/25/2014   21-Jun-2014 On Levothyroxine   . Intertrigo 02/02/2012   11/13   . Irregular heart beat    benign per pt.  . Left hand pain 05/31/2017  . Neoplasm of uncertain behavior of skin 03/12/2013   12/14 R ear, chest   . Night sweats 03/12/2013   12/14   . OSA on CPAP    mild with AHI 9/hr and oxygen desats as low as 75%  . Pain of cheek 05/31/2017  . Palpitations 03/27/2013   Labs Atenolol prn  . Paresis ([place])    right- s/p cerv decompression  . PERIORBITAL CELLULITIS 02/22/2009   Qualifier: Diagnosis of  By: Diona Browner MD, Amy    . 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 Shoulder pain 05/31/2017  . Thyroid disease   . Weight gain 05/23/2018   Try fasting Sharman Cheek "The Obesity Code"    Past Surgical History:  Procedure Laterality Date  . BICEPS TENDON REPAIR    . BRONCHIAL BIOPSY  10/23/2019   Procedure: BRONCHIAL BIOPSIES;  Surgeon: [name], MD;  Location[facility] ENDOSCOPY;  Service: Pulmonary;;  . BRONCHIAL BRUSHINGS  10/23/2019   Procedure: BRONCHIAL BRUSHINGS;  Surgeon: [name], MD;  Location: [facility] ENDOSCOPY;  Service: Pulmonary;;  . BRONCHIAL NEEDLE ASPIRATION BIOPSY  10/23/2019   Procedure: BRONCHIAL NEEDLE ASPIRATION BIOPSIES;  Surgeon: [name], MD;  Location: [facility] ENDOSCOPY;  Service: Pulmonary;;  . CATARACT EXTRACTION Left   . COLONOSCOPY  04-14-99   Dr Flossie Dibble polyp-TA in epic  . POLYPECTOMY  04-14-99  . POSTERIOR LAMINECTOMY / DECOMPRESSION CERVICAL SPINE     Dr Saintclair Halsted  . RETINAL DETACHMENT SURGERY     left eye, 06-20-2005 x2, 06-21-2006 x 3  . ROTATOR CUFF REPAIR  06/21/2002   right  . TONSILLECTOMY  [id number]  . VIDEO  BRONCHOSCOPY WITH ENDOBRONCHIAL NAVIGATION N/A 10/23/2019   Procedure: VIDEO BRONCHOSCOPY WITH ENDOBRONCHIAL NAVIGATION;  Surgeon: [name], MD;  Location: [place] ENDOSCOPY;  Service: Pulmonary;  Laterality: N/A;  . VIDEO BRONCHOSCOPY WITH ENDOBRONCHIAL ULTRASOUND N/A 10/23/2019   Procedure: VIDEO BRONCHOSCOPY WITH ENDOBRONCHIAL ULTRASOUND;  Surgeon: [name], MD;  Location: [facility] ENDOSCOPY;  Service: Pulmonary;  Laterality: N/A;    Family History  Problem Relation Age of Onset  . COPD Mother   . Diabetes Father   . Coronary artery disease Other   . Breast cancer Paternal Aunt 52  . Colon  cancer Neg Hx     Social History Social History   Tobacco Use  . Smoking status: Former Smoker    Packs/day: 0.80    Years: 50.00    Pack years: 40.00    Types: Cigarettes    Quit date: 08/27/2012    Years since quitting: 7.2  . Smokeless tobacco: Never Used  Vaping Use  . Vaping Use: Never used  Substance Use Topics  . Alcohol use: Not Currently  . Drug use: No    No Known Allergies  Current Outpatient Medications  Medication Sig Dispense Refill  . amLODipine (NORVASC) 5 MG tablet TAKE 1 TABLET BY MOUTH EVERY DAY (Patient taking differently: Take 5 mg by mouth daily. Midday) 90 tablet 3  . apixaban (ELIQUIS) 5 MG TABS tablet Take 1 tablet (5 mg total) by mouth 2 (two) times daily. 09:30 am Okay to restart this medication on 10/25/2019    . atenolol (TENORMIN) 25 MG tablet Take 1 tablet (25 mg total) by mouth daily as needed (for palpitations). 90 tablet 3  . Cholecalciferol (EQL VITAMIN D3) 1000 UNITS tablet Take 1,000 Units by mouth daily.      . diazepam (VALIUM) 5 MG tablet Take 1 tablet (5 mg total) by mouth daily.    [name] levothyroxine (SYNTHROID) 50 MCG tablet TAKE 1 TABLET BY MOUTH EVERY DAY (Patient taking differently: Take 50 mcg by mouth daily before breakfast. ) 90 tablet 3  . lovastatin (MEVACOR) 20 MG tablet TAKE 1 TABLET BY MOUTH EVERY NIGHT AT BEDTIME (Patient taking  differently: Take 20 mg by mouth at bedtime. ) 90 tablet 3  . Multiple Vitamin (MULTIVITAMIN) tablet Take 1 tablet by mouth daily. Centrum Silver.    Vladimir Faster Glycol-Propyl Glycol (SYSTANE) 0.4-0.3 % SOLN Place 1 drop into both eyes daily as needed (Dry eyes). Ultra     No current facility-administered medications for this visit.     REVIEW OF SYSTEMS:   Review of Systems  Constitutional: Positive for weight loss. Negative for appetite change, chills, fatigue, and fever.  HENT: Negative for mouth sores, nosebleeds, sore throat and trouble swallowing.   Eyes: Negative for eye problems and icterus.  Respiratory: Positive for left sided chest soreness. Negative for cough, hemoptysis, shortness of breath and wheezing.   Cardiovascular: Negative for leg swelling.  Gastrointestinal: Negative for abdominal pain, constipation, diarrhea, nausea and vomiting.  Genitourinary: Negative for bladder incontinence, difficulty urinating, dysuria, frequency and hematuria.   Musculoskeletal: Negative for back pain, gait problem, neck pain and neck stiffness.  Skin: Negative for itching and rash.  Neurological: Negative for dizziness, extremity weakness, gait problem, headaches, light-headedness and seizures.  Hematological: Negative for adenopathy. Does not bruise/bleed easily.  Psychiatric/Behavioral: Negative for confusion, depression and sleep disturbance. The patient is not nervous/anxious.     PHYSICAL EXAMINATION:  Blood pressure 135/72, pulse 72, temperature 99 F (37.2 C), temperature source Tympanic, resp. rate 17, height 5\' 11"  (1.803 m), weight 192 lb 1.6 oz (87.1 kg), SpO2 99 %.  ECOG PERFORMANCE STATUS: 0  Physical Exam  Constitutional: Oriented to person, place, and time and well-developed, well-nourished, and in no distress.  HENT:  Head: Normocephalic and atraumatic.  Mouth/Throat: Oropharynx is clear and moist. No oropharyngeal exudate.  Eyes: Conjunctivae are normal. Right eye  exhibits no discharge. Left eye exhibits no discharge. No scleral icterus.  Neck: Normal range of motion. Neck supple.  Cardiovascular: Normal rate, regular rhythm, normal heart sounds and intact distal pulses.   Pulmonary/Chest: Effort normal  and breath sounds normal. No respiratory distress. No wheezes. No rales.  Abdominal: Soft. Bowel sounds are normal. Exhibits no distension and no mass. There is no tenderness.  Musculoskeletal: Normal range of motion. Exhibits no edema.  Lymphadenopathy:    No cervical adenopathy.  Neurological: Alert and oriented to person, place, and time. Exhibits normal muscle tone. Gait normal. Coordination normal.  Skin: Skin is warm and dry. No rash noted. Not diaphoretic. No erythema. No pallor.  Psychiatric: Mood, memory and judgment normal.  Vitals reviewed.  LABORATORY DATA: Lab Results  Component Value Date   WBC 16.0 (H) 11/14/2019   HGB 15.8 11/14/2019   HCT 49.1 11/14/2019   MCV 92.1 11/14/2019   PLT 364 11/14/2019      Chemistry      Component Value Date/Time   NA 139 11/14/2019 1259   NA 143 03/10/2017 0911   K 4.5 11/14/2019 1259   K 4.7 03/10/2017 0911   CL 101 11/14/2019 1259   CO2 32 11/14/2019 1259   CO2 30 (H) 03/10/2017 0911   BUN 7 (L) 11/14/2019 1259   BUN 10.7 03/10/2017 0911   CREATININE 0.80 11/14/2019 1259   CREATININE 0.8 03/10/2017 0911      Component Value Date/Time   CALCIUM 10.5 (H) 11/14/2019 1259   CALCIUM 9.4 03/10/2017 0911   ALKPHOS 88 11/14/2019 1259   ALKPHOS 76 03/10/2017 0911   AST 18 11/14/2019 1259   AST 34 03/10/2017 0911   ALT 13 11/14/2019 1259   ALT 45 03/10/2017 0911   BILITOT 1.0 11/14/2019 1259   BILITOT 1.41 (H) 03/10/2017 0911       RADIOGRAPHIC STUDIES: NM PET Image Initial (PI) Skull Base To Thigh  Result Date: 11/10/2019 CLINICAL DATA:  Initial treatment strategy for lung mass. EXAM: NUCLEAR MEDICINE PET SKULL BASE TO THIGH TECHNIQUE: 9.7 mCi F-18 FDG was injected intravenously.  Full-ring PET imaging was performed from the skull base to thigh after the radiotracer. CT data was obtained and used for attenuation correction and anatomic localization. Fasting blood glucose: 86 mg/dl COMPARISON:  September 07, 2019 FINDINGS: Mediastinal blood pool activity: SUV max 3.24 Liver activity: SUV max NA NECK: No hypermetabolic lymph nodes in the neck. Incidental CT findings: None CHEST: Large LEFT upper lobe mass abutting the mediastinal border measuring approximately 7.2 x 4.7 cm, mass with similar appearance when compared to the previous imaging evaluation. (SUVmax = 20) AP window lymph node on the LEFT 1.4 cm (image 72, series 4) (SUVmax = 4.1) other small AP window lymph nodes showing slightly less FDG uptake on the same image., 3 additional lymph nodes in this area this is the dominant node in this location. Small lymph node between the pulmonary artery and the aorta (image 75, series 4) 7 mm (SUVmax = 4.4) RIGHT hilar lymph node 12 mm short axis (image 81, series 4) (SUVmax = 3.0) Small LEFT axillary lymph node nonspecific following recent COVID-19 vaccination displaced fatty hilum. Small nodule along the fissure in the RIGHT chest, along the minor fissure (image 35, series 8) 5 mm without FDG uptake beyond mediastinal blood pool Mildly nodular focus along the pleural surface in the RIGHT chest 7 mm (image 19, series 8) also without significant FDG uptake. (Image 74, series 4) 5 mm LEFT lower lobe pulmonary nodule along the pleural surface without significant FDG uptake. Incidental CT findings: Calcified atheromatous plaque of the thoracic aorta. Calcified coronary artery disease. No pericardial effusion. Esophagus grossly normal. Pulmonary emphysema. ABDOMEN/PELVIS: No abnormal hypermetabolic  activity within the liver, pancreas, adrenal glands, or spleen. No hypermetabolic lymph nodes in the abdomen or pelvis. Incidental CT findings: Moderate to marked hepatic steatosis. Cholelithiasis without  pericholecystic stranding. Pancreas normal contour. Spleen normal in size and contour. Adrenal glands are normal. No hydronephrosis. No acute bowel process. Normal appendix. Calcified atheromatous plaque of the abdominal aorta without aneurysmal dilation. No adenopathy. No pelvic adenopathy by size criteria. SKELETON: At the L4 vertebral level there are foci of increased metabolic activity (maximum SUV 4.6) not corresponding well with any signs of degenerative change at this location and without visible lesion. There is scattered other areas of mild heterogeneity that remain at or near blood pool throughout the spine favored to represent mild heterogeneous marrow activity. Incidental CT findings: Posterior cervical spinal fusion and laminectomy in the mid cervical spine. Spinal degenerative changes. IMPRESSION: 1. LEFT upper lobe mass compatible with bronchogenic neoplasm, associated with mediastinal adenopathy. 2. Contralateral RIGHT hilar lymph node with very mild FDG uptake but with imaging features that remains suspicious for contralateral nodal involvement. 3. Heterogeneous marrow uptake in general in the spine but with more focal areas at the L4 level as described. No imaging correlate on CT. Generalized mild heterogeneity elsewhere in marrow spaces, this could represent similar process but is more pronounced in terms of its FDG uptake. Lumbar spine MRI may be helpful for further assessment as clinically warranted. 4. Other small pulmonary nodules and nodular pleural based lesion as described without FDG uptake 5. Marked hepatic steatosis. 6. Cholelithiasis. 7. Atherosclerosis and pulmonary emphysema. Aortic Atherosclerosis (ICD10-I70.0) and Emphysema (ICD10-J43.9). Electronically Signed   By: Zetta Bills M.D.   On: 11/10/2019 13:19   DG Chest Port 1 View  Result Date: 10/23/2019 CLINICAL DATA:  Status post bronchoscopy/biopsy EXAM: PORTABLE CHEST 1 VIEW COMPARISON:  08/23/2019 plain film, 09/07/2019  chest CT FINDINGS: Cardiac shadow is within normal limits. Aortic calcifications are again seen. Left upper lobe mass lesion is again noted and stable. No evidence of post bronchoscopy pneumothorax is seen. The lungs remain hyperinflated. No new focal abnormality is seen. IMPRESSION: No evidence of post bronchoscopy pneumothorax. Electronically Signed   By: Inez Catalina M.D.   On: 10/23/2019 09:52   DG C-ARM BRONCHOSCOPY  Result Date: 10/23/2019 C-ARM BRONCHOSCOPY: Fluoroscopy was utilized by the requesting physician.  No radiographic interpretation.    ASSESSMENT: This very pleasant 74 year old Caucasian male referred to the clinic for evaluation of suspicious stage IIIb lung cancer pending tissue diagnosis.  The patient presented with a left upper lobe lung mass as well as associated mediastinal lymphadenopathy and contralateral right hilar mild hypermetabolism. Heterogeneous marrow uptake in general in the spine but with more focal areas at the L4 level without imaging correlate on CT. He is pending further work-up.   PLAN: The patient was seen with Dr. Julien Nordmann today.  Dr. Julien Nordmann personally and independently reviewed the results of his PET scan and discussed the results with the patient today.  Dr. Julien Nordmann discussed that we require tissue diagnosis to confirm his current condition.  We will reach out to Dr. Agustina Caroli office for consideration of a repeat bronchoscopy.   In the meantime, we will arrange for the patient to have a staging brain MRI performed.  If the patient's brain MRI is negative for metastatic disease, and he is biopsy-proven to have lung cancer, he is likely stage IIIb in which case we would recommend concurrent chemoradiation.  We will see him back for follow-up visit in 2 weeks for evaluation and  to review the results of his repeat bronchoscopy and for more detailed discussion about his current condition and recommended treatment options.  The patient voices understanding of  current disease status and treatment options and is in agreement with the current care plan.  All questions were answered. The patient knows to call the clinic with any problems, questions or concerns. We can certainly see the patient much sooner if necessary.  Thank you so much for allowing me to participate in the care of Joe [name]. I will continue to follow up the patient with you and assist in his care.  The total time spent in the appointment was 70 minutes.  Disclaimer: This note was dictated with voice recognition software. Similar sounding words can inadvertently be transcribed and may not be corrected upon review.   Tyrease Vandeberg L Xavion Muscat November 14, 2019, 2:45 PM  ADDENDUM: Hematology/Oncology Attending: I had a face-to-face encounter with the patient today.  I recommended his care plan.  This is a very pleasant 74 years old white male with stage 0 CLL as well as COPD, atrial fibrillation/flutter, dyslipidemia, hypertension, generalized anxiety disorder as well as hypothyroidism and obstructive sleep apnea.  The patient also has a history of heavy smoking but quit in 2014.  He presented to his primary care physician complaining of shortness of breath of several weeks in addition to associated fever or chills and drop in the oxygen saturation.  He was tested for Covid that was negative but chest x-ray on 08/23/2019 showed left upper lobe lung mass like lesion.  This was followed by CT scan of the chest with contrast on 09/07/2019 and it showed a large central necrotic masslike lesion which measures 6.2 x 4.9 cm extending from the pleura to the level of the left hilum and includes the upper lobe bronchial tree with associated medial atelectasis in the left upper lobe.  There was also scattered small mediastinal and hilar lymph nodes the largest measured 1.5 cm in short axis in the AP window.  The patient was seen by Dr. Lamonte Sakai and he underwent bronchoscopy with electromagnetic  navigational bronchoscopy and endobronchial ultrasound procedure on 10/23/2019.  Unfortunately the final pathology was not conclusive. The patient had a PET scan on 11/09/2019 and it showed large left upper lobe mass abutting the mediastinal border measuring approximately 7.2 x 4.7 cm with SUV max of 20.  There was AP window lymph node on the left measuring 1.4 cm with SUV max of 4.1 other small AP window lymph nodes showing slightly less FDG uptake on the same range with 3 additional lymph nodes in this area.  There was a small lymph node between the pulmonary artery and aorta measuring 0.7 cm with SUV max of 4.4 and right hilar lymph node measuring 1.2 cm with SUV max of 3.0. The patient was referred to me today for evaluation and recommendation regarding his condition. I had a lengthy discussion with the patient today about his condition and possible treatment options. I recommended for the patient to see Dr. Lamonte Sakai again for repeat bronchoscopy especially after the PET scan which may help him to choose the right location for the biopsy. We will arrange for the patient to have MRI of the brain to complete the staging work-up. We will see the patient back for follow-up visit in around 2 weeks for evaluation and discussion of his treatment options based on the final pathology and staging work-up. The patient was advised to call immediately if he has any other  concerning symptoms in the interval.  Disclaimer: This note was dictated with voice recognition software. Similar sounding words can inadvertently be transcribed and may be missed upon review. Eilleen Kempf, MD 11/14/19

## 2019-11-14 ENCOUNTER — Inpatient Hospital Stay: Payer: Medicare Other | Attending: Hematology | Admitting: Physician Assistant

## 2019-11-14 ENCOUNTER — Inpatient Hospital Stay: Payer: Medicare Other

## 2019-11-14 ENCOUNTER — Encounter: Payer: Self-pay | Admitting: Physician Assistant

## 2019-11-14 ENCOUNTER — Other Ambulatory Visit: Payer: Self-pay

## 2019-11-14 VITALS — BP 135/72 | HR 72 | Temp 99.0°F | Resp 17 | Ht 71.0 in | Wt 192.1 lb

## 2019-11-14 DIAGNOSIS — R918 Other nonspecific abnormal finding of lung field: Secondary | ICD-10-CM | POA: Diagnosis not present

## 2019-11-14 DIAGNOSIS — Z87891 Personal history of nicotine dependence: Secondary | ICD-10-CM | POA: Insufficient documentation

## 2019-11-14 DIAGNOSIS — I480 Paroxysmal atrial fibrillation: Secondary | ICD-10-CM | POA: Insufficient documentation

## 2019-11-14 DIAGNOSIS — I10 Essential (primary) hypertension: Secondary | ICD-10-CM | POA: Diagnosis not present

## 2019-11-14 DIAGNOSIS — Z8249 Family history of ischemic heart disease and other diseases of the circulatory system: Secondary | ICD-10-CM | POA: Insufficient documentation

## 2019-11-14 DIAGNOSIS — J449 Chronic obstructive pulmonary disease, unspecified: Secondary | ICD-10-CM | POA: Insufficient documentation

## 2019-11-14 DIAGNOSIS — I2510 Atherosclerotic heart disease of native coronary artery without angina pectoris: Secondary | ICD-10-CM | POA: Insufficient documentation

## 2019-11-14 DIAGNOSIS — C3490 Malignant neoplasm of unspecified part of unspecified bronchus or lung: Secondary | ICD-10-CM

## 2019-11-14 DIAGNOSIS — E785 Hyperlipidemia, unspecified: Secondary | ICD-10-CM | POA: Diagnosis not present

## 2019-11-14 DIAGNOSIS — C9110 Chronic lymphocytic leukemia of B-cell type not having achieved remission: Secondary | ICD-10-CM | POA: Insufficient documentation

## 2019-11-14 DIAGNOSIS — Z79899 Other long term (current) drug therapy: Secondary | ICD-10-CM | POA: Insufficient documentation

## 2019-11-14 DIAGNOSIS — F411 Generalized anxiety disorder: Secondary | ICD-10-CM | POA: Diagnosis not present

## 2019-11-14 DIAGNOSIS — G4733 Obstructive sleep apnea (adult) (pediatric): Secondary | ICD-10-CM | POA: Insufficient documentation

## 2019-11-14 DIAGNOSIS — E039 Hypothyroidism, unspecified: Secondary | ICD-10-CM | POA: Diagnosis not present

## 2019-11-14 DIAGNOSIS — Z803 Family history of malignant neoplasm of breast: Secondary | ICD-10-CM | POA: Insufficient documentation

## 2019-11-14 DIAGNOSIS — E669 Obesity, unspecified: Secondary | ICD-10-CM | POA: Insufficient documentation

## 2019-11-14 DIAGNOSIS — Z7901 Long term (current) use of anticoagulants: Secondary | ICD-10-CM | POA: Insufficient documentation

## 2019-11-14 LAB — CMP (CANCER CENTER ONLY)
ALT: 13 U/L (ref 0–44)
AST: 18 U/L (ref 15–41)
Albumin: 3.7 g/dL (ref 3.5–5.0)
Alkaline Phosphatase: 88 U/L (ref 38–126)
Anion gap: 6 (ref 5–15)
BUN: 7 mg/dL — ABNORMAL LOW (ref 8–23)
CO2: 32 mmol/L (ref 22–32)
Calcium: 10.5 mg/dL — ABNORMAL HIGH (ref 8.9–10.3)
Chloride: 101 mmol/L (ref 98–111)
Creatinine: 0.8 mg/dL (ref 0.61–1.24)
GFR, Est AFR Am: >60 mL/min (ref >60)
GFR, Estimated: >60 mL/min (ref >60)
Glucose, Bld: 86 mg/dL (ref 70–99)
Potassium: 4.5 mmol/L (ref 3.5–5.1)
Sodium: 139 mmol/L (ref 135–145)
Total Bilirubin: 1 mg/dL (ref 0.3–1.2)
Total Protein: 7.8 g/dL (ref 6.5–8.1)

## 2019-11-14 LAB — CBC WITH DIFFERENTIAL (CANCER CENTER ONLY)
Abs Immature Granulocytes: 0.04 10*3/uL (ref 0.00–0.07)
Basophils Absolute: 0.1 10*3/uL (ref 0.0–0.1)
Basophils Relative: 1 %
Eosinophils Absolute: 0.2 10*3/uL (ref 0.0–0.5)
Eosinophils Relative: 2 %
HCT: 49.1 % (ref 39.0–52.0)
Hemoglobin: 15.8 g/dL (ref 13.0–17.0)
Immature Granulocytes: 0 %
Lymphocytes Relative: 41 %
Lymphs Abs: 6.6 10*3/uL — ABNORMAL HIGH (ref 0.7–4.0)
MCH: 29.6 pg (ref 26.0–34.0)
MCHC: 32.2 g/dL (ref 30.0–36.0)
MCV: 92.1 fL (ref 80.0–100.0)
Monocytes Absolute: 1.4 10*3/uL — ABNORMAL HIGH (ref 0.1–1.0)
Monocytes Relative: 9 %
Neutro Abs: 7.6 10*3/uL (ref 1.7–7.7)
Neutrophils Relative %: 47 %
Platelet Count: 364 10*3/uL (ref 150–400)
RBC: 5.33 MIL/uL (ref 4.22–5.81)
RDW: 13.9 % (ref 11.5–15.5)
WBC Count: 16 10*3/uL — ABNORMAL HIGH (ref 4.0–10.5)
nRBC: 0 % (ref 0.0–0.2)

## 2019-11-14 NOTE — Patient Instructions (Addendum)
-  We will reach out to Dr. Lamonte Sakai to see if he can get you back sooner to repeat the biopsy.  -We will also place an order for a brain MRI just to make sure there is no evidence of spread to that area. They should call you to schedule this. Be on the lookout for a phone call from someone from radiology scheduling. If you do not hear from them, their number is [phone].  -If we prove that this is cancer, and there is no other evidence of disease to the brain, then the stage is likely stage IIIb. If it is stage III we will see you back in a few weeks to go over the the treatment in more details.  -If you need to call us back, our number is [phone]

## 2019-11-15 ENCOUNTER — Telehealth: Payer: Self-pay | Admitting: Emergency Medicine

## 2019-11-15 ENCOUNTER — Telehealth: Payer: Self-pay | Admitting: Physician Assistant

## 2019-11-15 ENCOUNTER — Other Ambulatory Visit: Payer: Self-pay | Admitting: *Deleted

## 2019-11-15 DIAGNOSIS — R918 Other nonspecific abnormal finding of lung field: Secondary | ICD-10-CM

## 2019-11-15 NOTE — Telephone Encounter (Signed)
Discussed PET, thoracic conference findings with patient. Consensus is that best strategy is a repeat bronchoscopy. We will arrange, goal for next week. He knows to stop his Eliquis 3 days prior.

## 2019-11-15 NOTE — Telephone Encounter (Signed)
Called and spoke with patient about repeat bronch procedure. Let him know that as of right now nothing has been scheduled and that we would call him to give him the information once it is scheduled. Also that Dr. Lamonte Sakai wants him to stop taking his Eliquis 3 days before procedure. Informed patient that he will need to be covid tested 3 days before the procedure and on the day he is covid tested that's when he will stop the Eliquis. He expressed understanding.   Dr. Lamonte Sakai has order for Bronch already been placed?

## 2019-11-15 NOTE — Telephone Encounter (Signed)
PET without distant disease or alternative bx target. Need to discuss repeat FOB

## 2019-11-15 NOTE — Telephone Encounter (Signed)
Scheduled per los. Called and spoke with patient. Confirmed appt 

## 2019-11-15 NOTE — Progress Notes (Signed)
The proposed treatment discussed in cancer conference is for discussion purpose only and is not a binding recommendation.  The patient was not physically examined nor present for their treatment options.  Therefore, final treatment plans cannot be decided.   Dr. Lamonte Sakai updated on recommendations.

## 2019-11-16 NOTE — Telephone Encounter (Signed)
Thanks it has been scheduled for Tuesday 8/31

## 2019-11-17 ENCOUNTER — Other Ambulatory Visit (HOSPITAL_COMMUNITY)
Admission: RE | Admit: 2019-11-17 | Discharge: 2019-11-17 | Disposition: A | Discharge status: Home / Self Care | Payer: Medicare Other | Source: Ambulatory Visit | Attending: Emergency Medicine | Admitting: Emergency Medicine

## 2019-11-17 DIAGNOSIS — Z20822 Contact with and (suspected) exposure to covid-19: Secondary | ICD-10-CM | POA: Insufficient documentation

## 2019-11-17 DIAGNOSIS — Z01812 Encounter for preprocedural laboratory examination: Secondary | ICD-10-CM | POA: Insufficient documentation

## 2019-11-17 LAB — SARS CORONAVIRUS 2 (TAT 6-24 HRS): SARS Coronavirus 2: NEGATIVE

## 2019-11-19 ENCOUNTER — Encounter (HOSPITAL_COMMUNITY): Payer: Self-pay | Admitting: Emergency Medicine

## 2019-11-19 ENCOUNTER — Other Ambulatory Visit: Payer: Self-pay

## 2019-11-19 NOTE — Progress Notes (Signed)
Joe [name] denies chest pain or shortness of breath. Patient was tested negative  for Covid  and has been in quarantine since that time.

## 2019-11-19 NOTE — Anesthesia Preprocedure Evaluation (Addendum)
Anesthesia Evaluation  Patient identified by MRN, date of birth, ID band Patient awake    Reviewed: Allergy & Precautions, NPO status , Patient's Chart, lab work & pertinent test results  Airway Mallampati: II  TM Distance: >3 FB     Dental   Pulmonary COPD, former smoker,    breath sounds clear to auscultation       Cardiovascular hypertension, + DOE  + dysrhythmias  Rhythm:Regular Rate:Normal     Neuro/Psych    GI/Hepatic Neg liver ROS, GERD  ,  Endo/Other  Hypothyroidism   Renal/GU negative Renal ROS     Musculoskeletal   Abdominal   Peds  Hematology   Anesthesia Other Findings   Reproductive/Obstetrics                           Anesthesia Physical Anesthesia Plan  ASA: III  Anesthesia Plan: General   Post-op Pain Management:    Induction: Intravenous  PONV Risk Score and Plan: 2 and Ondansetron, Midazolam and Dexamethasone  Airway Management Planned: Oral ETT  Additional Equipment:   Intra-op Plan:   Post-operative Plan: Possible Post-op intubation/ventilation  Informed Consent: I have reviewed the patients History and Physical, chart, labs and discussed the procedure including the risks, benefits and alternatives for the proposed anesthesia with the patient or authorized representative who has indicated his/her understanding and acceptance.     Dental advisory given  Plan Discussed with: CRNA and Anesthesiologist  Anesthesia Plan Comments: (See APP note by Durel Salts, FNP )      Anesthesia Quick Evaluation

## 2019-11-19 NOTE — Progress Notes (Signed)
Anesthesia Chart Review:  Pt is a same day work up    Case: [id number] Date/Time: 11/20/19 0945   Procedure: VIDEO BRONCHOSCOPY WITH ENDOBRONCHIAL NAVIGATION (N/A )   Anesthesia type: General   Pre-op diagnosis: MASS OF UPPER LOBE OF LEFT LUNG   Location: [place] [facility] [place] ENDOSCOPY   Surgeons: [name], MD      DISCUSSION:  Pt is a 74 year old with hx afib, HTN, COPD, OSA, CLL  - Pt to hold eliquis 3 days before procedure   PROVIDERS: - PCP is [name], [name], MD - Cardiologist is Fransico Him, MD. Last office visit 11/05/19 with Malka So, [place] - Oncologist is Truitt Merle, MD for [place], [place] and Francie Massing, MD 11/14/19 for lung mass   LABS:  - CBC 11/14/19 showed WBC 16; this is consistent with prior results - CMP 11/14/19 showed Ca 10.5, otherwise normal   IMAGES: 1 view CXR 10/23/19: No evidence of post bronchoscopy pneumothorax  CT chest 09/07/19:  - Large centrally necrotic mass lesion in the left upper lobe consistent with primary pulmonary neoplasm till proven otherwise. Associated hilar and mediastinal adenopathy is noted. Some atelectasis is noted in the medial aspect of the left upper lobe related to bronchial occlusion. Tissue sampling and further workup possibly to include a PET-CT is recommended.   EKG 11/05/19: NSR   CV: TTE 02/13/2019: 1. Left ventricular ejection fraction, by visual estimation, is 60 to 65%. The left ventricle has normal function. There is moderately increased left ventricular hypertrophy.  2. Left ventricular diastolic parameters are indeterminate.  3. Global right ventricle has normal systolic function.The right ventricular size is normal.  4. Left atrial size was normal.  5. Right atrial size was normal.  6. The mitral valve is normal in structure. No evidence of mitral valve regurgitation.  7. The tricuspid valve is not well visualized. Tricuspid valve  regurgitation is not demonstrated.  8. The  aortic valve was not well visualized. Aortic valve regurgitation  is mild to moderate. No evidence of aortic valve sclerosis or stenosis.  9. The pulmonic valve was not well visualized. Pulmonic valve  regurgitation is not visualized.  10. The inferior vena cava is normal in size with greater than 50%  respiratory variability, suggesting right atrial pressure of 3 mmHg.    Past Medical History:  Diagnosis Date  . Alcohol abuse, daily use 08/24/2010   Stopped 2015   . Arthralgia 12/13/2013   9/15 Not related to statins OA   . Cataract   . 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 CONJUNCTIVITIS, BACTERIAL, ACUTE 02/22/2009   Qualifier: Diagnosis of  By: Diona Browner MD, Amy    . COPD (chronic obstructive pulmonary disease) (HCC)    sats 93-97 % per pt.  [name] COPD mixed type ([place]) 07/26/2007   Smoker - stopped 6/14   . De Quervain's tenosynovitis, right 12/13/2013   2015   . Depression    at times  . DOE (dyspnea on exertion) 03/26/2014   1/16 deconditioning, COPD vs other   . Dysrhythmia    PAF  . Dysuria 12/13/2013   9/15 - poss stricture Urol ref was offered   . Gallstones 11/16/2017   Asymptomatic Pt refused surg ref  . Generalized anxiety disorder 09/07/2012   Chronic   Potential benefits of a long  term steroid  use as well as potential risks  and complications were explained to the patient and were aknowledged.     [name] GERD 12/02/2006   Chronic     . GERD (gastroesophageal reflux disease)   . Grief May 29, 2016   [name] died in 14-Jun-2014  . Gynecomastia 2013/12/28   Benign B 2013/06/13   . Hyperlipidemia   . Hypertension   . Hypothyroidism 12/25/2014   2014-06-14 On Levothyroxine   . Intertrigo 02/02/2012   11/13   . Irregular heart beat    benign per pt.  . Left hand pain 05/31/2017  . Neoplasm of uncertain behavior of skin 03/12/2013   12/14 R ear, chest   . Night sweats 03/12/2013    12/14   . OSA on CPAP    mild with AHI 9/hr and oxygen desats as low as 75%  . Pain of cheek 05/31/2017  . Palpitations 03/27/2013   Labs Atenolol prn  . Paresis ([place])    right- s/p cerv decompression  . PERIORBITAL CELLULITIS 02/22/2009   Qualifier: Diagnosis of  By: Diona Browner MD, Amy    . 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 Shoulder pain 05/31/2017  . Thyroid disease   . Weight gain 05/23/2018   Try fasting Sharman Cheek "The Obesity Code"    Past Surgical History:  Procedure Laterality Date  . BICEPS TENDON REPAIR    . BRONCHIAL BIOPSY  10/23/2019   Procedure: BRONCHIAL BIOPSIES;  Surgeon: [name], MD;  Location: [facility] ENDOSCOPY;  Service: Pulmonary;;  . BRONCHIAL BRUSHINGS  10/23/2019   Procedure: BRONCHIAL BRUSHINGS;  Surgeon: [name], MD;  Location: [facility] ENDOSCOPY;  Service: Pulmonary;;  . BRONCHIAL NEEDLE ASPIRATION BIOPSY  10/23/2019   Procedure: BRONCHIAL NEEDLE ASPIRATION BIOPSIES;  Surgeon: [name], MD;  Location: [facility] ENDOSCOPY;  Service: Pulmonary;;  . CATARACT EXTRACTION Left   . COLONOSCOPY  04-14-99   Dr Flossie Dibble polyp-TA in epic  . POLYPECTOMY  04-14-99  . POSTERIOR LAMINECTOMY / DECOMPRESSION CERVICAL SPINE     Dr Saintclair Halsted  . RETINAL DETACHMENT SURGERY     left eye, 13-Jun-2005 x2, 06-14-2006 x 3  . ROTATOR CUFF REPAIR  2002/06/14   right  . TONSILLECTOMY  [id number]  . VIDEO BRONCHOSCOPY WITH ENDOBRONCHIAL NAVIGATION N/A 10/23/2019   Procedure: VIDEO BRONCHOSCOPY WITH ENDOBRONCHIAL NAVIGATION;  Surgeon: [name], MD;  Location: [place] ENDOSCOPY;  Service: Pulmonary;  Laterality: N/A;  . VIDEO BRONCHOSCOPY WITH ENDOBRONCHIAL ULTRASOUND N/A 10/23/2019   Procedure: VIDEO BRONCHOSCOPY WITH ENDOBRONCHIAL ULTRASOUND;  Surgeon: [name], MD;  Location: [place] ENDOSCOPY;  Service: Pulmonary;  Laterality: N/A;    MEDICATIONS: No current facility-administered medications for this encounter.   [name] amLODipine (NORVASC) 5 MG tablet  . apixaban (ELIQUIS) 5 MG TABS tablet  . atenolol (TENORMIN)  25 MG tablet  . Cholecalciferol (EQL VITAMIN D3) 1000 UNITS tablet  . diazepam (VALIUM) 5 MG tablet  . levothyroxine (SYNTHROID) 50 MCG tablet  . lovastatin (MEVACOR) 20 MG tablet  . Multiple Vitamin (MULTIVITAMIN) tablet  . Polyethyl Glycol-Propyl Glycol (SYSTANE) 0.4-0.3 % SOLN   - Pt to hold eliquis 3 days before procedure   If no changes, I anticipate pt can proceed with surgery as scheduled.   Willeen Cass, PhD, FNP-BC [facility]/Anesthesiology Phone: [phone] 11/19/2019 11:03 AM

## 2019-11-20 ENCOUNTER — Ambulatory Visit (HOSPITAL_COMMUNITY): Payer: Medicare Other

## 2019-11-20 ENCOUNTER — Inpatient Hospital Stay (HOSPITAL_COMMUNITY): Payer: Medicare Other

## 2019-11-20 ENCOUNTER — Ambulatory Visit (HOSPITAL_COMMUNITY): Payer: Medicare Other | Admitting: Emergency Medicine

## 2019-11-20 ENCOUNTER — Other Ambulatory Visit: Payer: Self-pay

## 2019-11-20 ENCOUNTER — Encounter (HOSPITAL_COMMUNITY): Payer: Self-pay | Admitting: Emergency Medicine

## 2019-11-20 ENCOUNTER — Inpatient Hospital Stay (HOSPITAL_COMMUNITY)
Admission: RE | Admit: 2019-11-20 | Discharge: 2019-11-22 | DRG: 200 | Disposition: A | Discharge status: Home / Self Care | Payer: Medicare Other | Attending: Emergency Medicine | Admitting: Emergency Medicine

## 2019-11-20 ENCOUNTER — Encounter (HOSPITAL_COMMUNITY)
Admission: RE | Disposition: A | Discharge status: Home / Self Care | Payer: Self-pay | Source: Home / Self Care | Attending: Emergency Medicine

## 2019-11-20 DIAGNOSIS — Z7901 Long term (current) use of anticoagulants: Secondary | ICD-10-CM

## 2019-11-20 DIAGNOSIS — J939 Pneumothorax, unspecified: Secondary | ICD-10-CM | POA: Diagnosis present

## 2019-11-20 DIAGNOSIS — R918 Other nonspecific abnormal finding of lung field: Secondary | ICD-10-CM | POA: Diagnosis present

## 2019-11-20 DIAGNOSIS — Z9889 Other specified postprocedural states: Secondary | ICD-10-CM

## 2019-11-20 DIAGNOSIS — I4891 Unspecified atrial fibrillation: Secondary | ICD-10-CM | POA: Diagnosis present

## 2019-11-20 DIAGNOSIS — I480 Paroxysmal atrial fibrillation: Secondary | ICD-10-CM | POA: Diagnosis present

## 2019-11-20 DIAGNOSIS — J439 Emphysema, unspecified: Secondary | ICD-10-CM | POA: Diagnosis not present

## 2019-11-20 DIAGNOSIS — J449 Chronic obstructive pulmonary disease, unspecified: Secondary | ICD-10-CM | POA: Diagnosis present

## 2019-11-20 DIAGNOSIS — Z825 Family history of asthma and other chronic lower respiratory diseases: Secondary | ICD-10-CM

## 2019-11-20 DIAGNOSIS — Z8249 Family history of ischemic heart disease and other diseases of the circulatory system: Secondary | ICD-10-CM | POA: Diagnosis not present

## 2019-11-20 DIAGNOSIS — C9110 Chronic lymphocytic leukemia of B-cell type not having achieved remission: Secondary | ICD-10-CM | POA: Diagnosis present

## 2019-11-20 DIAGNOSIS — F411 Generalized anxiety disorder: Secondary | ICD-10-CM | POA: Diagnosis present

## 2019-11-20 DIAGNOSIS — I10 Essential (primary) hypertension: Secondary | ICD-10-CM | POA: Diagnosis present

## 2019-11-20 DIAGNOSIS — Z803 Family history of malignant neoplasm of breast: Secondary | ICD-10-CM

## 2019-11-20 DIAGNOSIS — K219 Gastro-esophageal reflux disease without esophagitis: Secondary | ICD-10-CM | POA: Diagnosis present

## 2019-11-20 DIAGNOSIS — Z79890 Hormone replacement therapy (postmenopausal): Secondary | ICD-10-CM | POA: Diagnosis not present

## 2019-11-20 DIAGNOSIS — Z87891 Personal history of nicotine dependence: Secondary | ICD-10-CM | POA: Diagnosis not present

## 2019-11-20 DIAGNOSIS — F329 Major depressive disorder, single episode, unspecified: Secondary | ICD-10-CM | POA: Diagnosis present

## 2019-11-20 DIAGNOSIS — Z833 Family history of diabetes mellitus: Secondary | ICD-10-CM

## 2019-11-20 DIAGNOSIS — I700 Atherosclerosis of aorta: Secondary | ICD-10-CM | POA: Diagnosis not present

## 2019-11-20 DIAGNOSIS — J95811 Postprocedural pneumothorax: Secondary | ICD-10-CM

## 2019-11-20 DIAGNOSIS — G4733 Obstructive sleep apnea (adult) (pediatric): Secondary | ICD-10-CM | POA: Diagnosis present

## 2019-11-20 DIAGNOSIS — Z20822 Contact with and (suspected) exposure to covid-19: Secondary | ICD-10-CM | POA: Diagnosis present

## 2019-11-20 DIAGNOSIS — Z79899 Other long term (current) drug therapy: Secondary | ICD-10-CM

## 2019-11-20 DIAGNOSIS — E785 Hyperlipidemia, unspecified: Secondary | ICD-10-CM | POA: Diagnosis present

## 2019-11-20 DIAGNOSIS — J9383 Other pneumothorax: Secondary | ICD-10-CM | POA: Diagnosis not present

## 2019-11-20 DIAGNOSIS — E039 Hypothyroidism, unspecified: Secondary | ICD-10-CM | POA: Diagnosis present

## 2019-11-20 DIAGNOSIS — C3412 Malignant neoplasm of upper lobe, left bronchus or lung: Secondary | ICD-10-CM | POA: Diagnosis not present

## 2019-11-20 DIAGNOSIS — R911 Solitary pulmonary nodule: Secondary | ICD-10-CM

## 2019-11-20 DIAGNOSIS — J90 Pleural effusion, not elsewhere classified: Secondary | ICD-10-CM | POA: Diagnosis not present

## 2019-11-20 HISTORY — PX: BRONCHIAL BIOPSY: SHX5109

## 2019-11-20 HISTORY — PX: BRONCHIAL NEEDLE ASPIRATION BIOPSY: SHX5106

## 2019-11-20 HISTORY — PX: BRONCHIAL BRUSHINGS: SHX5108

## 2019-11-20 HISTORY — PX: BRONCHIAL WASHINGS: SHX5105

## 2019-11-20 HISTORY — PX: VIDEO BRONCHOSCOPY WITH ENDOBRONCHIAL NAVIGATION: SHX6175

## 2019-11-20 SURGERY — VIDEO BRONCHOSCOPY WITH ENDOBRONCHIAL NAVIGATION
Anesthesia: General

## 2019-11-20 MED ORDER — ROCURONIUM BROMIDE 10 MG/ML (PF) SYRINGE
PREFILLED_SYRINGE | INTRAVENOUS | Status: DC | PRN
  Administered 2019-11-20: 60 mg via INTRAVENOUS

## 2019-11-20 MED ORDER — PRAVASTATIN SODIUM 10 MG PO TABS
20.0000 mg | ORAL_TABLET | Freq: Every day | ORAL | Status: DC
  Administered 2019-11-21: 20 mg via ORAL
  Filled 2019-11-20: qty 2

## 2019-11-20 MED ORDER — POLYVINYL ALCOHOL 1.4 % OP SOLN
1.0000 [drp] | Freq: Every day | OPHTHALMIC | Status: DC | PRN
  Filled 2019-11-20: qty 15

## 2019-11-20 MED ORDER — ATENOLOL 25 MG PO TABS: 25.0000 mg | ORAL_TABLET | Freq: Every day | ORAL | Status: DC | PRN

## 2019-11-20 MED ORDER — LACTATED RINGERS IV SOLN: INTRAVENOUS | Status: DC

## 2019-11-20 MED ORDER — DEXAMETHASONE SODIUM PHOSPHATE 10 MG/ML IJ SOLN
INTRAMUSCULAR | Status: DC | PRN
  Administered 2019-11-20: 10 mg via INTRAVENOUS

## 2019-11-20 MED ORDER — SUGAMMADEX SODIUM 200 MG/2ML IV SOLN
INTRAVENOUS | Status: DC | PRN
  Administered 2019-11-20: 200 mg via INTRAVENOUS

## 2019-11-20 MED ORDER — AMLODIPINE BESYLATE 5 MG PO TABS
5.0000 mg | ORAL_TABLET | Freq: Every day | ORAL | Status: DC
  Administered 2019-11-21 – 2019-11-22 (×2): 5 mg via ORAL
  Filled 2019-11-20 (×2): qty 1

## 2019-11-20 MED ORDER — EPINEPHRINE 1 MG/10ML IJ SOSY
PREFILLED_SYRINGE | INTRAMUSCULAR | Status: AC
  Filled 2019-11-20: qty 10

## 2019-11-20 MED ORDER — PROPOFOL 10 MG/ML IV BOLUS
INTRAVENOUS | Status: DC | PRN
  Administered 2019-11-20: 150 mg via INTRAVENOUS

## 2019-11-20 MED ORDER — ONDANSETRON HCL 4 MG/2ML IJ SOLN
INTRAMUSCULAR | Status: DC | PRN
  Administered 2019-11-20: 4 mg via INTRAVENOUS

## 2019-11-20 MED ORDER — FENTANYL CITRATE (PF) 100 MCG/2ML IJ SOLN: 25.0000 ug | INTRAMUSCULAR | Status: DC | PRN

## 2019-11-20 MED ORDER — FENTANYL CITRATE (PF) 100 MCG/2ML IJ SOLN
INTRAMUSCULAR | Status: DC | PRN
Start: 2019-11-20 — End: 2019-11-20
  Administered 2019-11-20 (×2): 50 ug via INTRAVENOUS

## 2019-11-20 MED ORDER — PHENYLEPHRINE HCL (PRESSORS) 10 MG/ML IV SOLN
INTRAVENOUS | Status: DC | PRN
  Administered 2019-11-20: 40 ug via INTRAVENOUS
  Administered 2019-11-20 (×2): 80 ug via INTRAVENOUS

## 2019-11-20 MED ORDER — APIXABAN 5 MG PO TABS
5.0000 mg | ORAL_TABLET | Freq: Two times a day (BID) | ORAL | Status: DC
Start: 2019-11-20 — End: 2019-11-22

## 2019-11-20 MED ORDER — LIDOCAINE 2% (20 MG/ML) 5 ML SYRINGE
INTRAMUSCULAR | Status: DC | PRN
  Administered 2019-11-20: 60 mg via INTRAVENOUS

## 2019-11-20 MED ORDER — ALBUTEROL SULFATE (2.5 MG/3ML) 0.083% IN NEBU: 2.5000 mg | INHALATION_SOLUTION | RESPIRATORY_TRACT | Status: DC | PRN

## 2019-11-20 MED ORDER — PRAVASTATIN SODIUM 20 MG PO TABS
20.0000 mg | ORAL_TABLET | Freq: Every day | ORAL | Status: DC
  Filled 2019-11-20: qty 1

## 2019-11-20 MED ORDER — ADULT MULTIVITAMIN W/MINERALS CH
1.0000 | ORAL_TABLET | Freq: Every day | ORAL | Status: DC
  Administered 2019-11-21 – 2019-11-22 (×2): 1 via ORAL
  Filled 2019-11-20 (×2): qty 1

## 2019-11-20 MED ORDER — CHLORHEXIDINE GLUCONATE 0.12 % MT SOLN
15.0000 mL | Freq: Once | OROMUCOSAL | Status: AC
  Administered 2019-11-20: 15 mL via OROMUCOSAL

## 2019-11-20 MED ORDER — LEVOTHYROXINE SODIUM 50 MCG PO TABS
50.0000 ug | ORAL_TABLET | Freq: Every day | ORAL | Status: DC
  Administered 2019-11-21 – 2019-11-22 (×2): 50 ug via ORAL
  Filled 2019-11-20 (×2): qty 1

## 2019-11-20 MED ORDER — VITAMIN D 25 MCG (1000 UNIT) PO TABS
1000.0000 [IU] | ORAL_TABLET | Freq: Every day | ORAL | Status: DC
  Administered 2019-11-21 – 2019-11-22 (×2): 1000 [IU] via ORAL
  Filled 2019-11-20 (×4): qty 1

## 2019-11-20 NOTE — H&P (Addendum)
NAME:  Joe [name], MRN:  [id number], DOB:  1946/02/14, LOS: 0 ADMISSION DATE:  11/20/2019, CONSULTATION DATE:  11/20/19 REFERRING MD:  Dr. Lamonte Sakai, CHIEF COMPLAINT:  Pneumothorax   Brief History   74 y/o M, former smoker, admitted 8/31 with iatrogenic pneumothorax s/p bronchoscopy with electromagnetic navigation and biopsy.     History of present illness   74 y/o M, former smoker, with LUL lung mass and mediastinal lymphadenopathy admitted 8/31 for planned bronchoscopy with electromagnetic navigation and biopsy.    IN June of 2021, he was found to have a large centrally necrotic 6.2 cm left upper lobe mass.  In early August 2021, he underwent navigational bronchoscopy which was unfortunately non-diagnostic.  In follow up, he underwent a PET scan on 11/10/19 which demonstrated hypermetabolic activity of the LUL mass, low level hypermetabolism of 4L node and small amount of 4R node.  He returned for planned FOB with ENB on 8/31 with transbronchial needle brushings, Wang needle biopsies, forceps biopsies, and BAL was obtained during the procedure. Post procedure, CXR evaluation noted a tiny left upper pneumothorax.  The patient was admitted for observation in the setting of iatrogenic pneumothorax.   Past Medical History  HTN HLD PAF  Depression  OSA on CPAP  COPD  Former Tobacco Abuse - quit 2014 CLL - diagnosed in 2016, followed by Dr. Burr Medico, Stage I  Hx ETOH Abuse  Hypothyroidism GERD  Significant Hospital Events   8/31 Admit for observation   Consults:    Procedures:    Significant Diagnostic Tests:   CT Chest w/contrast 09/07/19 >> large centrally necrotic mass lesion in the LUL consistent with primary pulmonary neoplasm until proven otherwise.  Associated hilar & mediastinal adenopathy. Some atelectasis noted in the medial aspect of the LUL related to bronchial occlusion  PET Scan 11/09/19 >> LUL mass compatible with bronchogenic neoplasm associated with mediastinal  adenopathy. Contralateral right hilar lymph node with very mild FDG uptake but with imaging features that remains suspicious for contralateral nodal involvement. Heterogenous marrow uptake in general in the spine but with more focal areas at the L4 level, other small pulmonary nodules and nodular pleural based lesion without FDG update, marked hepatic steatosis.     Micro Data:  COVID 8/28 >> negative  Cytology 8/31 >>   Antimicrobials:    Interim history/subjective:  Pt denies chest pain, SOB.    Objective   Blood pressure 102/66, pulse 65, temperature 98.2 F (36.8 C), resp. rate 16, height _0  (1.803 m), weight 87.1 kg, SpO2 92 %.        Intake/Output Summary (Last 24 hours) at 11/20/2019 1236 Last data filed at 11/20/2019 1049 Gross per 24 hour  Intake 700 ml  Output 25 ml  Net 675 ml   Filed Weights   11/20/19 0751  Weight: 87.1 kg    Examination: General: pleasant elderly gentleman sitting up on PACU stretcher   HEENT: MM pink/moist, [place] O2, anicteric Neuro: AAOx4, speech clear, MAE  CV: s1s2 rrr, no m/r/g PULM: non-labored on [place] O2, lungs bilaterally with soft wheeze  GI: soft, bsx4 active  Extremities: warm/dry, no edema  Skin: no rashes or lesions  8/31 CXR >> images personally reviewed, tiny left apical pneumothorax, LUL mass like opacity   Resolved Hospital Problem list     Assessment & Plan:   Iatrogenic Pneumothorax s/p FOB with ENB Hypermetabolic LUL Lung Mass  -admit for observation  -follow up CXR in 6 hours and in am  -O2  if needed to support sats >90% -reviewed with patient to notify RN if changes in SOB, chest pain to obtain STAT CXR   -explained process of chest tube placement to patient in the event he were to need  -pulmonary hygiene- mobilize  -follow up cytology   COPD without Acute Exerbation Former Tobacco Abuse OSA on CPAP  -not on inhalers per home med review -PRN albuterol   HTN HLD -continue home norvasc -pt reports he  takes atenolol PRN -continue lovastatin   PAF  -hold home eliquis -can resume on Thursday 9/2 if no need for chest tube or procedures   Hypothyroidism  -continue synthroid   Best practice:  Diet: As tolerated  Pain/Anxiety/Delirium protocol (if indicated): PRN pain control if needed  VAP protocol (if indicated): n/a  DVT prophylaxis: SCD's  GI prophylaxis: n/a  Glucose control: n/a Mobility: As tolerated  Code Status: Full Code  Family Communication: Patient updated on plan of care 8/31 Disposition: Observation   Labs   CBC: Recent Labs  Lab 11/14/19 1259  WBC 16.0*  NEUTROABS 7.6  HGB 15.8  HCT 49.1  MCV 92.1  PLT 025    Basic Metabolic Panel: Recent Labs  Lab 11/14/19 1259  NA 139  K 4.5  CL 101  CO2 32  GLUCOSE 86  BUN 7*  CREATININE 0.80  CALCIUM 10.5*   GFR: Estimated Creatinine Clearance: 87.6 mL/min (by C-G formula based on SCr of 0.8 mg/dL). Recent Labs  Lab 11/14/19 1259  WBC 16.0*    Liver Function Tests: Recent Labs  Lab 11/14/19 1259  AST 18  ALT 13  ALKPHOS 88  BILITOT 1.0  PROT 7.8  ALBUMIN 3.7   No results for input(s): LIPASE, AMYLASE in the last 168 hours. No results for input(s): AMMONIA in the last 168 hours.  ABG No results found for: PHART, PCO2ART, PO2ART, HCO3, TCO2, ACIDBASEDEF, O2SAT   Coagulation Profile: No results for input(s): INR, PROTIME in the last 168 hours.  Cardiac Enzymes: No results for input(s): CKTOTAL, CKMB, CKMBINDEX, TROPONINI in the last 168 hours.  HbA1C: No results found for: HGBA1C  CBG: No results for input(s): GLUCAP in the last 168 hours.  Review of Systems: Positives in [place]  Gen: Denies fever, chills, weight change, fatigue, night sweats HEENT: Denies blurred vision, double vision, hearing loss, tinnitus, sinus congestion, rhinorrhea, sore throat, neck stiffness, dysphagia PULM: Denies shortness of breath, cough, sputum production, hemoptysis, wheezing CV: Denies chest pain,  edema, orthopnea, paroxysmal nocturnal dyspnea, palpitations GI: Denies abdominal pain, nausea, vomiting, diarrhea, hematochezia, melena, constipation, change in bowel habits GU: Denies dysuria, hematuria, polyuria, oliguria, urethral discharge Endocrine: Denies hot or cold intolerance, polyuria, polyphagia or appetite change Derm: Denies rash, dry skin, scaling or peeling skin change Heme: Denies easy bruising, bleeding, bleeding gums Neuro: Denies headache, numbness, weakness, slurred speech, loss of memory or consciousness   Past Medical History  He,  has a past medical history of Alcohol abuse, daily use (08/24/2010), Arthralgia (12/13/2013), Cataract, Chronic lymphocytic leukemia ([place]) (07-18-2014), CLL (chronic lymphocytic leukemia) ([place]) (08/08/2014), CONJUNCTIVITIS, BACTERIAL, ACUTE (02/22/2009), COPD (chronic obstructive pulmonary disease) ([place]), COPD mixed type ([place]) (07/26/2007), De Quervain's tenosynovitis, right (12/13/2013), Depression, DOE (dyspnea on exertion) (03/26/2014), Dysrhythmia, Dysuria (12/13/2013), Gallstones (11/16/2017), Generalized anxiety disorder (09/07/2012), GERD (12/02/2006), GERD (gastroesophageal reflux disease), Grief (05/14/2016), Gynecomastia (12/13/2013), Hyperlipidemia, Hypertension, Hypothyroidism (12/25/2014), Intertrigo (02/02/2012), Irregular heart beat, Left hand pain (05/31/2017), Neoplasm of uncertain behavior of skin (03/12/2013), Night sweats (03/12/2013), OSA on CPAP, Pain of cheek (05/31/2017),  Palpitations (03/27/2013), Paresis ([place]), PERIORBITAL CELLULITIS (02/22/2009), Retinal detachment, Shoulder pain (05/31/2017), Thyroid disease, and Weight gain (05/23/2018).   Surgical History    Past Surgical History:  Procedure Laterality Date  . BICEPS TENDON REPAIR    . BRONCHIAL BIOPSY  10/23/2019   Procedure: BRONCHIAL BIOPSIES;  Surgeon: [name], MD;  Location: [facility] ENDOSCOPY;  Service: Pulmonary;;  . BRONCHIAL BRUSHINGS  10/23/2019   Procedure: BRONCHIAL BRUSHINGS;   Surgeon: [name], MD;  Location: [facility] ENDOSCOPY;  Service: Pulmonary;;  . BRONCHIAL NEEDLE ASPIRATION BIOPSY  10/23/2019   Procedure: BRONCHIAL NEEDLE ASPIRATION BIOPSIES;  Surgeon: [name], MD;  Location: [facility] ENDOSCOPY;  Service: Pulmonary;;  . CATARACT EXTRACTION Left   . COLONOSCOPY  04-14-99   Dr Flossie Dibble polyp-TA in epic  . POLYPECTOMY  04-14-99  . POSTERIOR LAMINECTOMY / DECOMPRESSION CERVICAL SPINE     Dr Saintclair Halsted  . RETINAL DETACHMENT SURGERY     left eye, 2007 x2, 2008 x 3  . ROTATOR CUFF REPAIR  2004   right  . TONSILLECTOMY  [id number]  . VIDEO BRONCHOSCOPY WITH ENDOBRONCHIAL NAVIGATION N/A 10/23/2019   Procedure: VIDEO BRONCHOSCOPY WITH ENDOBRONCHIAL NAVIGATION;  Surgeon: [name], MD;  Location: [place] ENDOSCOPY;  Service: Pulmonary;  Laterality: N/A;  . VIDEO BRONCHOSCOPY WITH ENDOBRONCHIAL ULTRASOUND N/A 10/23/2019   Procedure: VIDEO BRONCHOSCOPY WITH ENDOBRONCHIAL ULTRASOUND;  Surgeon: [name], MD;  Location: [place] ENDOSCOPY;  Service: Pulmonary;  Laterality: N/A;     Social History   reports that he quit smoking about 7 years ago. His smoking use included cigarettes. He has a 40.00 pack-year smoking history. He has never used smokeless tobacco. He reports previous alcohol use. He reports that he does not use drugs.   Family History   His family history includes Breast cancer (age of onset: 15) in his paternal aunt; COPD in his mother; Coronary artery disease in an other family member; Diabetes in his father. There is no history of Colon cancer.   Allergies No Known Allergies   Home Medications  Prior to Admission medications   Medication Sig Start Date End Date Taking? Authorizing Provider  amLODipine (NORVASC) 5 MG tablet TAKE 1 TABLET BY MOUTH EVERY DAY Patient taking differently: Take 5 mg by mouth daily. Midday 01/30/19  Yes [name], [name], MD  Cholecalciferol (EQL VITAMIN D3) 1000 UNITS tablet Take 1,000 Units by mouth daily.     Yes [provider]  diazepam (VALIUM) 5 MG tablet Take 1 tablet (5 mg total) by mouth daily. 10/23/19  Yes [name], MD  levothyroxine (SYNTHROID) 50 MCG tablet TAKE 1 TABLET BY MOUTH EVERY DAY Patient taking differently: Take 50 mcg by mouth daily before breakfast.  01/30/19  Yes [name], [name], MD  lovastatin (MEVACOR) 20 MG tablet TAKE 1 TABLET BY MOUTH EVERY NIGHT AT BEDTIME Patient taking differently: Take 20 mg by mouth at bedtime.  01/30/19  Yes [name], [name], MD  Multiple Vitamin (MULTIVITAMIN) tablet Take 1 tablet by mouth daily. Centrum Silver.   Yes [provider]  apixaban (ELIQUIS) 5 MG TABS tablet Take 1 tablet (5 mg total) by mouth 2 (two) times daily. Okay to restart this medication on 11/22/2019 11/20/19   [name], MD  atenolol (TENORMIN) 25 MG tablet Take 1 tablet (25 mg total) by mouth daily as needed (for palpitations). 05/23/19   [name], [name], MD  Polyethyl Glycol-Propyl Glycol (SYSTANE) 0.4-0.3 % SOLN Place 1 drop into both eyes daily  as needed (Dry eyes). Ultra    [provider]     Critical care time: n/a      Noe Gens, MSN, NP-C [facility] 11/20/2019, 12:37 PM   Please see [email or web address] for pager details.

## 2019-11-20 NOTE — Plan of Care (Signed)
  Problem: Health Behavior/Discharge Planning: Goal: Ability to manage health-related needs will improve Outcome: Progressing   Problem: Clinical Measurements: Goal: Will remain free from infection Outcome: Progressing   

## 2019-11-20 NOTE — Discharge Instructions (Signed)
Flexible Bronchoscopy, Care After This sheet gives you information about how to care for yourself after your test. Your doctor may also give you more specific instructions. If you have problems or questions, contact your doctor. Follow these instructions at home: Eating and drinking  Do not eat or drink anything (not even water) for 2 hours after your test, or until your numbing medicine (local anesthetic) wears off.  When your numbness is gone and your cough and gag reflexes have come back, you may: ? Eat only soft foods. ? Slowly drink liquids.  The day after the test, go back to your normal diet. Driving  Do not drive for 24 hours if you were given a medicine to help you relax (sedative).  Do not drive or use heavy machinery while taking prescription pain medicine. General instructions   Take over-the-counter and prescription medicines only as told by your doctor.  Return to your normal activities as told. Ask what activities are safe for you.  Do not use any products that have nicotine or tobacco in them. This includes cigarettes and e-cigarettes. If you need help quitting, ask your doctor.  Keep all follow-up visits as told by your doctor. This is important. It is very important if you had a tissue sample (biopsy) taken. Get help right away if:  You have shortness of breath that gets worse.  You get light-headed.  You feel like you are going to pass out (faint).  You have chest pain.  You cough up: ? More than a little blood. ? More blood than before. Summary  Do not eat or drink anything (not even water) for 2 hours after your test, or until your numbing medicine wears off.  Do not use cigarettes. Do not use e-cigarettes.  Get help right away if you have chest pain.  Please call our office for any questions or concerns.  [phone]  You can restart your Eliquis on Thursday, 11/22/2019  This information is not intended to replace advice given to you by your  health care provider. Make sure you discuss any questions you have with your health care provider. Document Revised: 02/18/2017 Document Reviewed: 03/26/2016 Elsevier Patient Education  2020 [employer].

## 2019-11-20 NOTE — Anesthesia Postprocedure Evaluation (Signed)
Anesthesia Post Note  Patient: Joe [name]  Procedure(s) Performed: VIDEO BRONCHOSCOPY WITH ENDOBRONCHIAL NAVIGATION (N/A ) BRONCHIAL BRUSHINGS BRONCHIAL NEEDLE ASPIRATION BIOPSIES BRONCHIAL BIOPSIES BRONCHIAL WASHINGS     Patient location during evaluation: PACU Anesthesia Type: General Level of consciousness: awake Pain management: pain level controlled Vital Signs Assessment: post-procedure vital signs reviewed and stable Respiratory status: spontaneous breathing Cardiovascular status: stable Postop Assessment: no apparent nausea or vomiting Anesthetic complications: no   No complications documented.  Last Vitals:  Vitals:   11/20/19 1133 11/20/19 1148  BP: 110/67 102/66  Pulse: 63 65  Resp: 18 16  Temp:  36.8 C  SpO2: 95% 92%    Last Pain:  Vitals:   11/20/19 1148  TempSrc:   PainSc: 0-No pain                 Raed Schalk

## 2019-11-20 NOTE — Progress Notes (Signed)
CCMD called to make aware patient had 5 beat run of non-sustained v-tach, one couplet and one triplet? Per report from ENDO patient SR/SB and afib.  Patient arrived with NSR.  Pt resting with call bell within reach.  Will continue to monitor.

## 2019-11-20 NOTE — H&P (Signed)
Joe [name] is an 74 y.o. male.   Chief Complaint: Left upper lobe mass HPI: 74 year old former smoker with COPD, atrial fibrillation on Eliquis, stage I CLL, hypertension, OSA on CPAP. He was found to have a large centrally necrotic 6.2 cm left upper lobe mass on CT chest 09/07/2019.  I performed navigational bronchoscopy and endobronchial ultrasound of mediastinal lymphadenopathy on 10/23/2019 but this was nondiagnostic.  He has since had a PET scan on 11/10/2019 that identifies hypermetabolism in the left upper lobe mass with some central necrosis and decreased activity.  There is low level hypermetabolism also noted in 4L node, possibly also a small amount and a 4R node.  He returns today for repeat bronchoscopy to hopefully obtain adequate tissue.  No new issues reported.  He is on Eliquis and stopped it on 8/27.  Past Medical History:  Diagnosis Date  . Alcohol abuse, daily use 08/24/2010   Stopped June 12, 2013   . Arthralgia 27-Dec-2013   9/15 Not related to statins OA   . Cataract   . Chronic lymphocytic leukemia ([place]) 07-18-2014   chronic stage 1- no symtoms  . CLL (chronic lymphocytic leukemia) ([place]) 08/08/2014   June 13, 2014 Dr Burr Medico Stage 0  . CONJUNCTIVITIS, BACTERIAL, ACUTE 02/22/2009   Qualifier: Diagnosis of  By: Diona Browner MD, Amy    . COPD (chronic obstructive pulmonary disease) (HCC)    sats 93-97 % per pt.  [name] COPD mixed type ([place]) 07/26/2007   Smoker - stopped 6/14   . De Quervain's tenosynovitis, right 12-27-2013   2013-06-12   . Depression    at times  . DOE (dyspnea on exertion) 03/26/2014   1/16 deconditioning, COPD vs other   . Dysrhythmia    PAF  . Dysuria 2013-12-27   9/15 - poss stricture Urol ref was offered   . Gallstones 11/16/2017   Asymptomatic Pt refused surg ref  . Generalized anxiety disorder 09/07/2012   Chronic   Potential benefits of a long term steroid  use as well as potential risks  and complications were explained to the patient and were aknowledged.     [name] GERD 12/02/2006    Chronic     . GERD (gastroesophageal reflux disease)   . Grief 05-28-2016   [name] died in 06-13-2014  . Gynecomastia 12-27-2013   Benign B 2013/06/12   . Hyperlipidemia   . Hypertension   . Hypothyroidism 12/25/2014   2014-06-13 On Levothyroxine   . Intertrigo 02/02/2012   11/13   . Irregular heart beat    benign per pt.  . Left hand pain 05/31/2017  . Neoplasm of uncertain behavior of skin 03/12/2013   12/14 R ear, chest   . Night sweats 03/12/2013   12/14   . OSA on CPAP    mild with AHI 9/hr and oxygen desats as low as 75%  . Pain of cheek 05/31/2017  . Palpitations 03/27/2013   Labs Atenolol prn  . Paresis ([place])    right- s/p cerv decompression  . PERIORBITAL CELLULITIS 02/22/2009   Qualifier: Diagnosis of  By: Diona Browner MD, Amy    . 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 Shoulder pain 05/31/2017  . Thyroid disease   . Weight gain 05/23/2018   Try fasting Sharman Cheek "The Obesity Code"    Past Surgical History:  Procedure Laterality Date  . BICEPS TENDON REPAIR    . BRONCHIAL BIOPSY  10/23/2019   Procedure: BRONCHIAL BIOPSIES;  Surgeon: [name], MD;  Location: [facility] ENDOSCOPY;  Service: Pulmonary;;  . BRONCHIAL BRUSHINGS  10/23/2019   Procedure: BRONCHIAL BRUSHINGS;  Surgeon: [name], MD;  Location: [facility] ENDOSCOPY;  Service: Pulmonary;;  . BRONCHIAL NEEDLE ASPIRATION BIOPSY  10/23/2019   Procedure: BRONCHIAL NEEDLE ASPIRATION BIOPSIES;  Surgeon: [name], MD;  Location: [facility] ENDOSCOPY;  Service: Pulmonary;;  . CATARACT EXTRACTION Left   . COLONOSCOPY  04-14-99   Dr Flossie Dibble polyp-TA in epic  . POLYPECTOMY  04-14-99  . POSTERIOR LAMINECTOMY / DECOMPRESSION CERVICAL SPINE     Dr Saintclair Halsted  . RETINAL DETACHMENT SURGERY     left eye, 2007 x2, 2008 x 3  . ROTATOR CUFF REPAIR  2004   right  . TONSILLECTOMY  [id number]  . VIDEO BRONCHOSCOPY WITH ENDOBRONCHIAL NAVIGATION N/A 10/23/2019   Procedure: VIDEO BRONCHOSCOPY WITH ENDOBRONCHIAL NAVIGATION;  Surgeon: [name], MD;  Location: [facility] ENDOSCOPY;   Service: Pulmonary;  Laterality: N/A;  . VIDEO BRONCHOSCOPY WITH ENDOBRONCHIAL ULTRASOUND N/A 10/23/2019   Procedure: VIDEO BRONCHOSCOPY WITH ENDOBRONCHIAL ULTRASOUND;  Surgeon: [name], MD;  Location: [facility] ENDOSCOPY;  Service: Pulmonary;  Laterality: N/A;    Family History  Problem Relation Age of Onset  . COPD Mother   . Diabetes Father   . Coronary artery disease Other   . Breast cancer Paternal Aunt 54  . Colon cancer Neg Hx    Social History:  reports that he quit smoking about 7 years ago. His smoking use included cigarettes. He has a 40.00 pack-year smoking history. He has never used smokeless tobacco. He reports previous alcohol use. He reports that he does not use drugs.  Allergies: No Known Allergies  Medications Prior to Admission  Medication Sig Dispense Refill  . amLODipine (NORVASC) 5 MG tablet TAKE 1 TABLET BY MOUTH EVERY DAY (Patient taking differently: Take 5 mg by mouth daily. Midday) 90 tablet 3  . apixaban (ELIQUIS) 5 MG TABS tablet Take 1 tablet (5 mg total) by mouth 2 (two) times daily. 09:30 am Okay to restart this medication on 10/25/2019    . Cholecalciferol (EQL VITAMIN D3) 1000 UNITS tablet Take 1,000 Units by mouth daily.      . diazepam (VALIUM) 5 MG tablet Take 1 tablet (5 mg total) by mouth daily.    [name] levothyroxine (SYNTHROID) 50 MCG tablet TAKE 1 TABLET BY MOUTH EVERY DAY (Patient taking differently: Take 50 mcg by mouth daily before breakfast. ) 90 tablet 3  . lovastatin (MEVACOR) 20 MG tablet TAKE 1 TABLET BY MOUTH EVERY NIGHT AT BEDTIME (Patient taking differently: Take 20 mg by mouth at bedtime. ) 90 tablet 3  . Multiple Vitamin (MULTIVITAMIN) tablet Take 1 tablet by mouth daily. Centrum Silver.    [name] atenolol (TENORMIN) 25 MG tablet Take 1 tablet (25 mg total) by mouth daily as needed (for palpitations). 90 tablet 3  . Polyethyl Glycol-Propyl Glycol (SYSTANE) 0.4-0.3 % SOLN Place 1 drop into both eyes daily as needed (Dry eyes). Ultra      No  results found for this or any previous visit (from the past 48 hour(s)). No results found.  Review of Systems  Blood pressure 134/74, pulse 70, temperature 98.2 F (36.8 C), temperature source Oral, resp. rate 20, height 5\' 11"  (1.803 m), weight 87.1 kg, SpO2 95 %. Physical Exam  Gen: Pleasant, well-nourished, in no distress,  normal affect  ENT: No lesions,  mouth clear,  oropharynx clear, edentulous, no postnasal drip, M1 airway  Neck: No JVD, no stridor  Lungs: No use of accessory  muscles, no crackles or wheezing on normal respiration, no wheeze on forced expiration  Cardiovascular: RRR, heart sounds normal, no murmur or gallops, no peripheral edema  Abdomen: soft and NT, no HSM,  BS normal  Musculoskeletal: No deformities, no cyanosis or clubbing  Neuro: alert, awake, non focal  Skin: Warm, no lesions or rashes  Assessment/Plan Left upper lobe hypermetabolic lung mass with some central necrosis, mediastinal lymphadenopathy that was negative on EBUS and has light hypermetabolism on PET.  Question stage III disease.  His initial ENB and biopsies were negative.  He returns today to repeat the procedure, hopefully achieve a tissue diagnosis.  He has been off Eliquis since 8/27.  Risk and benefits discussed with the patient.  All questions answered.  No barriers identified.    [name], MD 11/20/2019, 8:48 AM

## 2019-11-20 NOTE — Transfer of Care (Signed)
Immediate Anesthesia Transfer of Care Note  Patient: Joe [name]  Procedure(s) Performed: VIDEO BRONCHOSCOPY WITH ENDOBRONCHIAL NAVIGATION (N/A ) BRONCHIAL BRUSHINGS BRONCHIAL NEEDLE ASPIRATION BIOPSIES BRONCHIAL BIOPSIES BRONCHIAL WASHINGS  Patient Location: PACU  Anesthesia Type:General  Level of Consciousness: awake, alert , oriented and patient cooperative  Airway & Oxygen Therapy: Patient Spontanous Breathing and Patient connected to face mask oxygen  Post-op Assessment: Report given to RN and Post -op Vital signs reviewed and stable  Post vital signs: Reviewed and stable  Last Vitals:  Vitals Value Taken Time  BP 134/88 11/20/19 1048  Temp 36.7 C 11/20/19 1048  Pulse 104 11/20/19 1049  Resp 19 11/20/19 1049  SpO2 98 % 11/20/19 1049  Vitals shown include unvalidated device data.  Last Pain:  Vitals:   11/20/19 1048  TempSrc:   PainSc: (P) 0-No pain         Complications: No complications documented.

## 2019-11-20 NOTE — Op Note (Signed)
Video Bronchoscopy with Electromagnetic Navigation Procedure Note  Date of Operation: 11/20/2019  Pre-op Diagnosis: Left upper lobe mass  Post-op Diagnosis: Same  Surgeon: Baltazar Apo  Assistants: None  Anesthesia: General endotracheal anesthesia  Operation: Flexible video fiberoptic bronchoscopy with electromagnetic navigation and biopsies.  Estimated Blood Loss: Minimal  Complications: None apparent  Indications and History: Joe [name] is a 74 y.o. male with history of tobacco use.  He has a left upper lobe mass on CT scan of the chest as well as mediastinal lymphadenopathy.  Navigational bronchoscopy and endobronchial ultrasound on 8/3 were nondiagnostic.  The nodes were negative.  Recommendation was made to repeat his navigational bronchoscopy to achieve a tissue diagnosis.  The risks, benefits, complications, treatment options and expected outcomes were discussed with the patient.  The possibilities of pneumothorax, pneumonia, reaction to medication, pulmonary aspiration, perforation of a viscus, bleeding, failure to diagnose a condition and creating a complication requiring transfusion or operation were discussed with the patient who freely signed the consent.    Description of Procedure: The patient was seen in the Preoperative Area, was examined and was deemed appropriate to proceed.  The patient was taken to [facility] endoscopy room 2, identified as Joe [name] and the procedure verified as Flexible Video Fiberoptic Bronchoscopy.  A Time Out was held and the above information confirmed.   Prior to the date of the procedure a high-resolution CT scan of the chest was performed. Utilizing Hernandez and the patient's PET scan which identified areas of hypermetabolism, a virtual tracheobronchial tree was generated to allow the creation of distinct navigation pathways to 2 hypermetabolic regions of the patient's left upper lobe mass. After being taken to the  operating room general anesthesia was initiated and the patient  was orally intubated. The video fiberoptic bronchoscope was introduced via the endotracheal tube and a general inspection was performed which showed normal airways on the right.  There was hyperemia and vascularity, some irregular bronchial mucosa on the apical lateral subsegment of the left upper lobe.  This area was brushed for cytology and endobronchial biopsies were performed. The extendable working channel and locator guide were introduced into the bronchoscope. The distinct navigation pathways prepared prior to this procedure were then utilized to navigate to the hypermetabolic areas of the left upper lobe mass.  As was the case during the patient's first navigational bronchoscopy was difficult to enter the appropriate airway to aim directly at the hypermetabolic regions.  A flexible guidewire was used through the working channel to isolate the airway of interest.  The working channel was advanced over the guidewire secured in position and then the guidewire was removed for replacement of the locator guide.  Once the airway of interest was entered I was able to navigate to within 0.3 cm of the hypermetabolic region of the patient's left upper lobe mass. The extendable working channel was secured into place and the locator guide was withdrawn. Under fluoroscopic guidance transbronchial needle brushings, transbronchial Wang needle biopsies, and transbronchial forceps biopsies were performed to be sent for cytology and pathology. A bronchioalveolar lavage was performed in the left upper lobe adjacent to the mass and sent for cytology. At the end of the procedure a general airway inspection was performed and there was no evidence of active bleeding. The bronchoscope was removed.  The patient tolerated the procedure well. There was no significant blood loss and there were no obvious complications. A post-procedural chest x-ray is  pending.  Samples: 1. Transbronchial  needle brushings from left upper lobe mass 2. Transbronchial Wang needle biopsies from left upper lobe mass 3. Transbronchial forceps biopsies from left upper lobe mass 4. Bronchoalveolar lavage from left upper lobe 5. Endobronchial biopsies from left upper lobe apical lateral segment carina 6.  Endobronchial brushings from the left upper lobe apical lateral segment carina.  Plans:  The patient will be discharged from the PACU to home when recovered from anesthesia and after chest x-ray is reviewed. We will review the cytology, pathology and microbiology results with the patient when they become available. Outpatient followup will be with Dr Lamonte Sakai and Dr Julien Nordmann.    Baltazar Apo, MD, PhD 11/20/2019, 10:40 AM [facility] [phone] or if no answer [phone]

## 2019-11-20 NOTE — Progress Notes (Signed)
Patient arrived from ENDO.  VSS, CHB bath, gown changed, oriented to unit, CCMD called on [id number]. Pt resting with call bell within reach.  Will continue to monitor.

## 2019-11-21 ENCOUNTER — Inpatient Hospital Stay (HOSPITAL_COMMUNITY): Payer: Medicare Other

## 2019-11-21 LAB — CBC
HCT: 44.8 % (ref 39.0–52.0)
Hemoglobin: 14.2 g/dL (ref 13.0–17.0)
MCH: 29.4 pg (ref 26.0–34.0)
MCHC: 31.7 g/dL (ref 30.0–36.0)
MCV: 92.8 fL (ref 80.0–100.0)
Platelets: 354 10*3/uL (ref 150–400)
RBC: 4.83 MIL/uL (ref 4.22–5.81)
RDW: 13.5 % (ref 11.5–15.5)
WBC: 18.3 10*3/uL — ABNORMAL HIGH (ref 4.0–10.5)
nRBC: 0 % (ref 0.0–0.2)

## 2019-11-21 LAB — BASIC METABOLIC PANEL
Anion gap: 11 (ref 5–15)
BUN: 9 mg/dL (ref 8–23)
CO2: 25 mmol/L (ref 22–32)
Calcium: 8.9 mg/dL (ref 8.9–10.3)
Chloride: 102 mmol/L (ref 98–111)
Creatinine, Ser: 0.74 mg/dL (ref 0.61–1.24)
GFR calc Af Amer: >60 mL/min (ref >60)
GFR calc non Af Amer: >60 mL/min (ref >60)
Glucose, Bld: 123 mg/dL — ABNORMAL HIGH (ref 70–99)
Potassium: 4.5 mmol/L (ref 3.5–5.1)
Sodium: 138 mmol/L (ref 135–145)

## 2019-11-21 LAB — SURGICAL PATHOLOGY

## 2019-11-21 LAB — CYTOLOGY - NON PAP

## 2019-11-21 NOTE — Progress Notes (Signed)
Discussed bronchoscopy biopsy results with the patient.  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  He is established with medical oncology with [facility].  We will discuss therapeutics with them once he is stabilized from this hospitalization.  Baltazar Apo, MD, PhD 11/21/2019, 5:44 PM [facility] [phone] or if no answer [phone]

## 2019-11-21 NOTE — Progress Notes (Signed)
NAME:  Joe [name], MRN:  [id number], DOB:  09-30-1945, LOS: 1 ADMISSION DATE:  11/20/2019, CONSULTATION DATE:  11/20/19 REFERRING MD:  Dr. Lamonte Sakai, CHIEF COMPLAINT:  Pneumothorax   Brief History   74 y/o M, former smoker, admitted 8/31 with iatrogenic pneumothorax s/p bronchoscopy with electromagnetic navigation and biopsy.     History of present illness   74 y/o M, former smoker, with LUL lung mass and mediastinal lymphadenopathy admitted 8/31 for planned bronchoscopy with electromagnetic navigation and biopsy.    IN June of 2021, he was found to have a large centrally necrotic 6.2 cm left upper lobe mass.  In early August 2021, he underwent navigational bronchoscopy which was unfortunately non-diagnostic.  In follow up, he underwent a PET scan on 11/10/19 which demonstrated hypermetabolic activity of the LUL mass, low level hypermetabolism of 4L node and small amount of 4R node.  He returned for planned FOB with ENB on 8/31 with transbronchial needle brushings, Wang needle biopsies, forceps biopsies, and BAL was obtained during the procedure. Post procedure, CXR evaluation noted a tiny left upper pneumothorax.  The patient was admitted for observation in the setting of iatrogenic pneumothorax.   Past Medical History  HTN HLD PAF  Depression  OSA on CPAP  COPD  Former Tobacco Abuse - quit 2014 CLL - diagnosed in 2016, followed by Dr. Burr Medico, Stage I  Hx ETOH Abuse  Hypothyroidism GERD  Significant Hospital Events   8/31 Admit for observation   Consults:    Procedures:    Significant Diagnostic Tests:   CT Chest w/contrast 09/07/19 >> large centrally necrotic mass lesion in the LUL consistent with primary pulmonary neoplasm until proven otherwise.  Associated hilar & mediastinal adenopathy. Some atelectasis noted in the medial aspect of the LUL related to bronchial occlusion  PET Scan 11/09/19 >> LUL mass compatible with bronchogenic neoplasm associated with mediastinal  adenopathy. Contralateral right hilar lymph node with very mild FDG uptake but with imaging features that remains suspicious for contralateral nodal involvement. Heterogenous marrow uptake in general in the spine but with more focal areas at the L4 level, other small pulmonary nodules and nodular pleural based lesion without FDG update, marked hepatic steatosis.     Micro Data:  COVID 8/28 >> negative  Cytology 8/31 >>   Antimicrobials:    Interim history/subjective:   Very mild left chest soreness, no cough, no dyspnea  Objective   Blood pressure 110/63, pulse 66, temperature 98.5 F (36.9 C), temperature source Oral, resp. rate 14, height _0  (1.803 m), weight 87.1 kg, SpO2 98 %.        Intake/Output Summary (Last 24 hours) at 11/21/2019 1433 Last data filed at 11/21/2019 0451 Gross per 24 hour  Intake 0 ml  Output 775 ml  Net -775 ml   Filed Weights   11/20/19 0751 11/20/19 1441  Weight: 87.1 kg 87.1 kg    Examination: General: Comfortable no distress HEENT: Oropharynx clear no stridor Neuro: Awake, alert, appropriate interacting CV: Regular, no murmur PULM: clear lungs B  GI: non distended  Extremities: no edema Skin: no rash  9/1 CXR at 13:00 reviewed by me, L apical PTX appears stable in size  Resolved Hospital Problem list     Assessment & Plan:   Iatrogenic Pneumothorax s/p FOB with ENB Hypermetabolic LUL Lung Mass  Plan to continue to follow PTX for stability with serial CXR's. If stable on next CXR on 9/2 then would plan to discharge to home.  Left  upper lobe biopsies squamous cell carcinoma  COPD without Acute Exerbation Former Tobacco Abuse OSA on CPAP  Albuterol prn  HTN HLD Hold Norvasc He uses atenolol on an as-needed basis at home Continue lovastatin  PAF  Home Eliquis Plan to restart Eliquis 9/2 if no indication for pigtail chest tube  Hypothyroidism  Continue Synthroid  Best practice:  Diet: As tolerated  Pain/Anxiety/Delirium  protocol (if indicated): PRN pain control if needed  VAP protocol (if indicated): n/a  DVT prophylaxis: SCD's  GI prophylaxis: n/a  Glucose control: n/a Mobility: As tolerated  Code Status: Full Code  Family Communication: Patient updated on plan of care 9/1 Disposition: Observation   Labs   CBC: Recent Labs  Lab 11/21/19 0216  WBC 18.3*  HGB 14.2  HCT 44.8  MCV 92.8  PLT 116    Basic Metabolic Panel: Recent Labs  Lab 11/21/19 0216  NA 138  K 4.5  Joe 102  CO2 25  GLUCOSE 123*  BUN 9  CREATININE 0.74  CALCIUM 8.9   GFR: Estimated Creatinine Clearance: 87.6 mL/min (by C-G formula based on SCr of 0.74 mg/dL). Recent Labs  Lab 11/21/19 0216  WBC 18.3*    Liver Function Tests: No results for input(s): AST, ALT, ALKPHOS, BILITOT, PROT, ALBUMIN in the last 168 hours. No results for input(s): LIPASE, AMYLASE in the last 168 hours. No results for input(s): AMMONIA in the last 168 hours.  ABG No results found for: PHART, PCO2ART, PO2ART, HCO3, TCO2, ACIDBASEDEF, O2SAT   Coagulation Profile: No results for input(s): INR, PROTIME in the last 168 hours.  Cardiac Enzymes: No results for input(s): CKTOTAL, CKMB, CKMBINDEX, TROPONINI in the last 168 hours.  HbA1C: No results found for: HGBA1C  CBG: No results for input(s): GLUCAP in the last 168 hours.   Critical care time: n/a     Baltazar Apo, MD, PhD 11/21/2019, 2:33 PM [facility] [phone] or if no answer [phone]

## 2019-11-22 ENCOUNTER — Inpatient Hospital Stay (HOSPITAL_COMMUNITY): Payer: Medicare Other

## 2019-11-22 MED ORDER — APIXABAN 5 MG PO TABS
5.0000 mg | ORAL_TABLET | Freq: Two times a day (BID) | ORAL | Status: DC
Start: 2019-11-23 — End: 2019-12-17

## 2019-11-22 NOTE — Progress Notes (Signed)
Mobility Specialist: Progress Note   11/22/19 1416  Mobility  Activity Ambulated in hall  Level of [place]  Distance Ambulated (ft) 220 ft  Mobility Response Tolerated well  Mobility performed by Mobility specialist  $Mobility charge 1 Mobility   Pt had no c/o.   [facility] Cornie Herrington Mobility Specialist

## 2019-11-22 NOTE — Discharge Summary (Signed)
Physician Discharge Summary       Patient ID: Joe [name] MRN: [id number] DOB/AGE: 1945/05/03 74 y.o.  Admit date: 11/20/2019 Discharge date: 11/22/2019  Discharge Diagnoses:  Principal Problem:   Mass of upper lobe of left lung Active Problems:   Pneumothorax, left   Pneumothorax   History of Present Illness: Joe [name] is a 74 year old former smoker with COPD, A. fib on Eliquis, stage I CLL, OSA on CPAP.  He underwent a repeat navigational bronchoscopy today to help diagnose a left upper lobe mass.  This was complicated by a small left apical pneumothorax.  He is being admitted for observation.  Hospital Course:  Serial chest x-rays were performed and the small left apical pneumothorax did not increase in size. Chest film continue to be stable in the morning of 11/22/2019. Based on his overall clinical stability and the absence of any further apparent air leak he was deemed stable and appropriate for discharge home.   Discharge Plan by active problems  Iatrogenic pneumothorax following navigational bronchoscopy. -Stable on serial chest x-rays. -He will be discharged home on room air. If any evidence of chest pain, dyspnea he knows that he needs to seek care immediately and have a repeat chest x-ray to ensure no enlargement of his pneumothorax.  Left upper lobe mass. 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 -I will refer him to [facility] in [facility] for further care -He is set to have staging MRI brain tomorrow 9/3. -PET scan has already been done on 11/10/2019  COPD without evidence of acute exacerbation. -Not on scheduled bronchodilators at home. We can reconsider this based on pulmonary function testing. He may benefit from maintenance bronchodilator therapy. Follow-up as an outpatient.  OSA on CPAP -Continue CPAP as ordered  Hypertension and hyperlipidemia -Plan to restart his home amlodipine. He apparently uses atenolol on as-needed basis at home -Continue  lovastatin  Paroxysmal atrial fibrillation -Plan to restart his home Eliquis on Friday, 11/23/2019  Hypothyroidism -Continue home Synthroid  Significant Hospital tests/ studies  Consults  None Discharge Exam: BP 126/71 (BP Location: Right Arm)    Pulse (!) 58    Temp 98.2 F (36.8 C) (Oral)    Resp 18    Ht 5\' 11"  (1.803 m)    Wt 87.1 kg Comment: st. per pt   SpO2 96%    BMI 26.78 kg/m   Gen: Pleasant, well-nourished, in no distress,  normal affect  ENT: No lesions,  mouth clear,  oropharynx clear, no postnasal drip  Neck: No JVD, no stridor  Lungs: No use of accessory muscles, no crackles or wheezing on normal respiration, no wheeze on forced expiration  Cardiovascular: RRR, heart sounds normal, no murmur or gallops, no peripheral edema  Abdomen: soft and NT, no HSM,  BS normal  Musculoskeletal: No deformities, no cyanosis or clubbing  Neuro: alert, awake, non focal  Skin: Warm, no lesions or rash  Labs at discharge Lab Results  Component Value Date   CREATININE 0.74 11/21/2019   BUN 9 11/21/2019   NA 138 11/21/2019   K 4.5 11/21/2019   CL 102 11/21/2019   CO2 25 11/21/2019   Lab Results  Component Value Date   WBC 18.3 (H) 11/21/2019   HGB 14.2 11/21/2019   HCT 44.8 11/21/2019   MCV 92.8 11/21/2019   PLT 354 11/21/2019   Lab Results  Component Value Date   ALT 13 11/14/2019   AST 18 11/14/2019   ALKPHOS 88 11/14/2019   BILITOT  1.0 11/14/2019   Lab Results  Component Value Date   INR 1.4 (H) 10/15/2019    Current radiology studies DG Chest Port 1 View  Result Date: 11/22/2019 CLINICAL DATA:  Pneumothorax EXAM: PORTABLE CHEST 1 VIEW COMPARISON:  11/21/2019 and multiple prior studies FINDINGS: Trachea midline. Cardiomediastinal contours are stable. LEFT suprahilar and upper lobe mass is redemonstrated with persistent LEFT apical and lateral pneumothorax, unchanged with respect to size on frontal view. Blunting of LEFT costodiaphragmatic sulcus may  reflect small effusion. There is a small sub pulmonic component to the pneumothorax previously, this is no longer evident. Emphysematous changes. RIGHT chest is clear. Visualized skeletal structures without acute process. IMPRESSION: 1. Unchanged LEFT apical and lateral pneumothorax. 2. Blunting of LEFT costodiaphragmatic sulcus may reflect small amount of pleural fluid in the LEFT chest. 3. LEFT suprahilar and upper lobe mass again noted. Electronically Signed   By: Zetta Bills M.D.   On: 11/22/2019 08:28   DG Chest Port 1 View  Result Date: 11/21/2019 CLINICAL DATA:  Pneumothorax after biopsy EXAM: PORTABLE CHEST 1 VIEW COMPARISON:  Earlier same day FINDINGS: Left apical pneumothorax is similar in size. Left upper lobe mass again noted. Emphysema. No new findings. Stable cardiomediastinal contours. IMPRESSION: Stable small left apical pneumothorax. Electronically Signed   By: Macy Mis M.D.   On: 11/21/2019 14:28   DG CHEST PORT 1 VIEW  Result Date: 11/21/2019 CLINICAL DATA:  Left pneumothorax. EXAM: PORTABLE CHEST 1 VIEW COMPARISON:  November 20, 2019. FINDINGS: The heart size and mediastinal contours are within normal limits. Right lung is clear. Stable mild left apical pneumothorax is noted. Left upper lobe pulmonary mass is again noted. No significant pleural effusion is noted. The visualized skeletal structures are unremarkable. IMPRESSION: Stable mild left apical pneumothorax. Left upper lobe pulmonary mass is again noted. Aortic Atherosclerosis (ICD10-I70.0). Electronically Signed   By: Marijo Conception M.D.   On: 11/21/2019 08:07   DG CHEST PORT 1 VIEW  Result Date: 11/20/2019 CLINICAL DATA:  Pneumothorax EXAM: PORTABLE CHEST 1 VIEW COMPARISON:  11:55 a.m. FINDINGS: The lungs are mildly hyperinflated in keeping with changes of underlying COPD. Small left apical pneumothorax is again identified has slightly increased in size since prior examination. No mediastinal shift or hyperexpansion of  the left hemithorax to suggest tension physiology. Left upper lobe pulmonary mass is again noted. No pleural effusion. Cardiac size within normal limits. Pulmonary vascularity is normal. IMPRESSION: 1. Slight interval increase in size of small left apical pneumothorax. 2. Left upper lobe pulmonary mass again noted. 3. COPD. Electronically Signed   By: Fidela Salisbury MD   On: 11/20/2019 19:29    Disposition:  Discharge disposition: 01-Home or Self Care       Discharge Instructions    Discharge patient   Complete by: As directed    After chest x-ray has been evaluated   Discharge disposition: 01-Home or Self Care   Discharge patient date: 11/20/2019     Allergies as of 11/22/2019   No Known Allergies     Medication List    TAKE these medications   amLODipine 5 MG tablet Commonly known as: NORVASC TAKE 1 TABLET BY MOUTH EVERY DAY What changed: additional instructions   apixaban 5 MG Tabs tablet Commonly known as: Eliquis Take 1 tablet (5 mg total) by mouth 2 (two) times daily. Okay to restart this medication on 11/23/2019 Start taking on: November 23, 2019 What changed: additional instructions   atenolol 25 MG tablet Commonly known  as: TENORMIN Take 1 tablet (25 mg total) by mouth daily as needed (for palpitations).   diazepam 5 MG tablet Commonly known as: VALIUM Take 1 tablet (5 mg total) by mouth daily.   EQL Vitamin D3 25 MCG (1000 UT) tablet Generic drug: Cholecalciferol Take 1,000 Units by mouth daily.   levothyroxine 50 MCG tablet Commonly known as: SYNTHROID TAKE 1 TABLET BY MOUTH EVERY DAY What changed: when to take this   lovastatin 20 MG tablet Commonly known as: MEVACOR TAKE 1 TABLET BY MOUTH EVERY NIGHT AT BEDTIME   multivitamin tablet Take 1 tablet by mouth daily. Centrum Silver.   Systane 0.4-0.3 % Soln Generic drug: Polyethyl Glycol-Propyl Glycol Place 1 drop into both eyes daily as needed (Dry eyes). Ultra       Follow-up Information     Lauraine Rinne, NP Follow up on 11/27/2019.   Specialty: Pulmonary Disease Why: 2:30pm Contact information: [address] [place] [address] [phone]               Discharged Condition: good  Greater than 35 minutes of time have been dedicated to discharge assessment, planning and discharge instructions.   Signed:  Baltazar Apo, MD, PhD 11/22/2019, 1:06 PM [facility] [phone] or if no answer [phone]

## 2019-11-23 ENCOUNTER — Other Ambulatory Visit: Payer: Self-pay

## 2019-11-23 ENCOUNTER — Ambulatory Visit (HOSPITAL_COMMUNITY)
Admission: RE | Admit: 2019-11-23 | Discharge: 2019-11-23 | Disposition: A | Discharge status: Home / Self Care | Payer: Medicare Other | Source: Ambulatory Visit | Attending: Physician Assistant | Admitting: Physician Assistant

## 2019-11-23 DIAGNOSIS — G319 Degenerative disease of nervous system, unspecified: Secondary | ICD-10-CM | POA: Diagnosis not present

## 2019-11-23 DIAGNOSIS — C3490 Malignant neoplasm of unspecified part of unspecified bronchus or lung: Secondary | ICD-10-CM | POA: Diagnosis not present

## 2019-11-23 DIAGNOSIS — I618 Other nontraumatic intracerebral hemorrhage: Secondary | ICD-10-CM | POA: Diagnosis not present

## 2019-11-23 MED ORDER — GADOBUTROL 1 MMOL/ML IV SOLN
8.0000 mL | Freq: Once | INTRAVENOUS | Status: AC | PRN
  Administered 2019-11-23: 8 mL via INTRAVENOUS

## 2019-11-27 ENCOUNTER — Ambulatory Visit (INDEPENDENT_AMBULATORY_CARE_PROVIDER_SITE_OTHER): Payer: Medicare Other

## 2019-11-27 ENCOUNTER — Encounter: Payer: Self-pay | Admitting: Internal Medicine

## 2019-11-27 ENCOUNTER — Encounter: Payer: Self-pay | Admitting: Pulmonary Disease

## 2019-11-27 ENCOUNTER — Other Ambulatory Visit: Payer: Self-pay

## 2019-11-27 ENCOUNTER — Ambulatory Visit (INDEPENDENT_AMBULATORY_CARE_PROVIDER_SITE_OTHER): Payer: Medicare Other | Admitting: Pulmonary Disease

## 2019-11-27 ENCOUNTER — Ambulatory Visit (INDEPENDENT_AMBULATORY_CARE_PROVIDER_SITE_OTHER): Payer: Medicare Other | Admitting: Internal Medicine

## 2019-11-27 VITALS — BP 120/70 | HR 72 | Temp 97.3°F | Ht 71.0 in | Wt 190.1 lb

## 2019-11-27 VITALS — BP 124/78 | HR 67 | Temp 98.5°F | Ht 71.0 in | Wt 198.0 lb

## 2019-11-27 DIAGNOSIS — Z23 Encounter for immunization: Secondary | ICD-10-CM | POA: Diagnosis not present

## 2019-11-27 DIAGNOSIS — J449 Chronic obstructive pulmonary disease, unspecified: Secondary | ICD-10-CM

## 2019-11-27 DIAGNOSIS — Z0000 Encounter for general adult medical examination without abnormal findings: Secondary | ICD-10-CM | POA: Diagnosis not present

## 2019-11-27 DIAGNOSIS — R918 Other nonspecific abnormal finding of lung field: Secondary | ICD-10-CM

## 2019-11-27 DIAGNOSIS — I480 Paroxysmal atrial fibrillation: Secondary | ICD-10-CM

## 2019-11-27 DIAGNOSIS — Z9989 Dependence on other enabling machines and devices: Secondary | ICD-10-CM | POA: Diagnosis not present

## 2019-11-27 DIAGNOSIS — J95811 Postprocedural pneumothorax: Secondary | ICD-10-CM

## 2019-11-27 DIAGNOSIS — R0600 Dyspnea, unspecified: Secondary | ICD-10-CM | POA: Diagnosis not present

## 2019-11-27 DIAGNOSIS — J90 Pleural effusion, not elsewhere classified: Secondary | ICD-10-CM | POA: Diagnosis not present

## 2019-11-27 DIAGNOSIS — J439 Emphysema, unspecified: Secondary | ICD-10-CM | POA: Diagnosis not present

## 2019-11-27 DIAGNOSIS — C9110 Chronic lymphocytic leukemia of B-cell type not having achieved remission: Secondary | ICD-10-CM | POA: Diagnosis not present

## 2019-11-27 DIAGNOSIS — R0609 Other forms of dyspnea: Secondary | ICD-10-CM

## 2019-11-27 DIAGNOSIS — G4733 Obstructive sleep apnea (adult) (pediatric): Secondary | ICD-10-CM

## 2019-11-27 MED ORDER — LOVASTATIN 20 MG PO TABS
20.0000 mg | ORAL_TABLET | Freq: Every day | ORAL | 3 refills | Status: DC
Start: 2019-11-27 — End: 2020-01-24

## 2019-11-27 MED ORDER — LEVOTHYROXINE SODIUM 50 MCG PO TABS
50.0000 ug | ORAL_TABLET | Freq: Every day | ORAL | 3 refills | Status: DC
Start: 2019-11-27 — End: 2020-01-24

## 2019-11-27 MED ORDER — AMLODIPINE BESYLATE 5 MG PO TABS
5.0000 mg | ORAL_TABLET | Freq: Every day | ORAL | 3 refills | Status: DC
Start: 2019-11-27 — End: 2020-01-24

## 2019-11-27 NOTE — Assessment & Plan Note (Signed)
Plan: Recommend seasonal flu vaccine

## 2019-11-27 NOTE — Assessment & Plan Note (Signed)
S/p recent L pneumothorax

## 2019-11-27 NOTE — Assessment & Plan Note (Signed)
Plan: Continue follow-up with oncology

## 2019-11-27 NOTE — Assessment & Plan Note (Signed)
Dr Burr Medico

## 2019-11-27 NOTE — Assessment & Plan Note (Signed)
Status post bronchoscopy  Plan: Chest x-ray today

## 2019-11-27 NOTE — Addendum Note (Signed)
Addended by: Lauralee Evener C on: 11/27/2019 03:40 PM   Modules accepted: Orders

## 2019-11-27 NOTE — Assessment & Plan Note (Signed)
Plan: Continue CPAP therapy 

## 2019-11-27 NOTE — Assessment & Plan Note (Signed)
Bx - Squamous cell carcinoma Dr Julien Nordmann

## 2019-11-27 NOTE — Progress Notes (Signed)
[facility] [name] OFFICE PROGRESS NOTE  [name], [name], MD [place] [address]  DIAGNOSIS: Stage IIIb non-small cell lung cancer, squamous cell carcinoma.  The patient presented with a left upper lobe lung mass as well as associated mediastinal lymphadenopathy and contralateral right hilar mild hypermetabolism.  There was heterogeneous marrow uptake in the spine but with more focal areas at L4 without imaging correlation on CT scan.  The patient was diagnosed in August 2021.  PD-L1 expression as:  PRIOR THERAPY: None  CURRENT THERAPY: Concurrent chemoradiation with carboplatin for an AUC of 2 and paclitaxel 45 mg per metered squared weekly.  First dose expected on 12/13/2019.  INTERVAL HISTORY: Joe [name] 74 y.o. male returns to clinic today for a follow-up visit.  The patient is feeling fair today without any concerning complaints. The patient recently had a repeat bronchoscopy performed by pulmonology for tissue diagnosis which was consistent with squamous cell carcinoma. The patient also had his staging brain MRI which was negative for metastatic disease to the brain.  The patient had a small apical pneumothorax following his bronchoscopy. He had some pleuritic chest discomfort and shortness of breath associated with his pneumothorax. He notes that the chest discomfort his resolved. He has been checking his oxygen saturation at home.   He denies any fever, chills, or night sweats.  He had been losing weight prior to his diagnosis. Denies any nausea, vomiting, diarrhea, or constipation.  Denies any headache or visual changes.  The patient is here today for evaluation and for more detailed discussion about his current condition and treatment options.    MEDICAL HISTORY: Past Medical History:  Diagnosis Date  . Alcohol abuse, daily use 08/24/2010   Stopped June 20, 2013   . Arthralgia 01-12-2014   9/15 Not related to statins OA   . Cataract   . 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 CLL (chronic lymphocytic leukemia) ([place]) 08/08/2014   21-Jun-2014 Dr Burr Medico Stage 0  . COPD mixed type ([place]) 07/26/2007   Smoker - stopped 6/14   . De Quervain's tenosynovitis, right 12-Jan-2014   06-20-2013   . Depression    at times  . Dysrhythmia    PAF  . Dysuria 01-12-14   9/15 - poss stricture Urol ref was offered   . Gallstones 11/16/2017   Asymptomatic Pt refused surg ref  . Generalized anxiety disorder 09/07/2012   Chronic   Potential benefits of a long term steroid  use as well as potential risks  and complications were explained to the patient and were aknowledged.     [name] GERD 12/02/2006   Chronic     . Grief 06-13-2016   [name] died in 2014-06-21  . Gynecomastia 01-12-2014   Benign B Jun 20, 2013   . Hyperlipidemia   . Hypertension   . Hypothyroidism 12/25/2014   2014/06/21 On Levothyroxine   . Intertrigo 02/02/2012   11/13   . Neoplasm of uncertain behavior of skin 03/12/2013   12/14 R ear, chest   . OSA on CPAP    mild with AHI 9/hr and oxygen desats as low as 75%  . Paresis ([place])    right- s/p cerv decompression  . PERIORBITAL CELLULITIS 02/22/2009   Qualifier: Diagnosis of  By: Diona Browner MD, Amy    . 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  MEDICATIONS:  Current Outpatient Medications  Medication Sig Dispense Refill  .  amLODipine (NORVASC) 5 MG tablet Take 1 tablet (5 mg total) by mouth daily. 90 tablet 3  . apixaban (ELIQUIS) 5 MG TABS tablet Take 1 tablet (5 mg total) by mouth 2 (two) times daily. Okay to restart this medication on 11/23/2019 60 tablet   . atenolol (TENORMIN) 25 MG tablet Take 1 tablet (25 mg total) by mouth daily as needed (for palpitations). 90 tablet 3  . Cholecalciferol (EQL VITAMIN D3) 1000 UNITS tablet Take 1,000 Units by mouth daily.      . diazepam (VALIUM) 5 MG tablet Take 1 tablet (5 mg total) by mouth daily.    [name] levothyroxine (SYNTHROID) 50 MCG tablet Take 1 tablet (50 mcg  total) by mouth daily. 90 tablet 3  . lovastatin (MEVACOR) 20 MG tablet Take 1 tablet (20 mg total) by mouth at bedtime. 90 tablet 3  . Multiple Vitamin (MULTIVITAMIN) tablet Take 1 tablet by mouth daily. Centrum Silver.    Vladimir Faster Glycol-Propyl Glycol (SYSTANE) 0.4-0.3 % SOLN Place 1 drop into both eyes daily as needed (Dry eyes). Ultra    . prochlorperazine (COMPAZINE) 10 MG tablet Take 1 tablet (10 mg total) by mouth every 6 (six) hours as needed. 30 tablet 2   No current facility-administered medications for this visit.    SURGICAL HISTORY:  Past Surgical History:  Procedure Laterality Date  . BICEPS TENDON REPAIR    . BRONCHIAL BIOPSY  10/23/2019   Procedure: BRONCHIAL BIOPSIES;  Surgeon: [name], MD;  Location: [facility] ENDOSCOPY;  Service: Pulmonary;;  . BRONCHIAL BIOPSY  11/20/2019   Procedure: BRONCHIAL BIOPSIES;  Surgeon: [name], MD;  Location: [facility] END[facility]COPY;  Service: Pulmonary;;  . BRONCHIAL BRUSHINGS  10/23/2019   Procedure: BRONCHIAL BRUSHINGS;  Surgeon: [name], MD;  Location: [facility] ENDOSCOPY;  Service: Pulmonary;;  . BRONCHIAL BRUSHINGS  11/20/2019   Procedure: BRONCHIAL BRUSHINGS;  Surgeon: [name], MD;  Location: [facility] ENDOSCOPY;  Service: Pulmonary;;  . BRONCHIAL NEEDLE ASPIRATION BIOPSY  10/23/2019   Procedure: BRONCHIAL NEEDLE ASPIRATION BIOPSIES;  Surgeon: [name], MD;  Location: [facility] ENDOSCOPY;  Service: Pulmonary;;  . BRONCHIAL NEEDLE ASPIRATION BIOPSY  11/20/2019   Procedure: BRONCHIAL NEEDLE ASPIRATION BIOPSIES;  Surgeon: [name], MD;  Location: [place];  Service: Pulmonary;;  . BRONCHIAL WASHINGS  11/20/2019   Procedure: BRONCHIAL WASHINGS;  Surgeon: [name], MD;  Location: [facility] ENDOSCOPY;  Service: Pulmonary;;  . CATARACT EXTRACTION Left   . COLONOSCOPY  04-14-99   Dr Flossie Dibble polyp-TA in epic  . POLYPECTOMY  04-14-99  . POSTERIOR LAMINECTOMY / DECOMPRESSION CERVICAL SPINE     Dr Saintclair Halsted  . RETINAL DETACHMENT SURGERY      left eye, 2007 x2, 2008 x 3  . ROTATOR CUFF REPAIR  2004   right  . TONSILLECTOMY  [id number]  . VIDEO BRONCHOSCOPY WITH ENDOBRONCHIAL NAVIGATION N/A 10/23/2019   Procedure: VIDEO BRONCHOSCOPY WITH ENDOBRONCHIAL NAVIGATION;  Surgeon: [name], MD;  Location: [place] ENDOSCOPY;  Service: Pulmonary;  Laterality: N/A;  . VIDEO BRONCHOSCOPY WITH ENDOBRONCHIAL NAVIGATION N/A 11/20/2019   Procedure: VIDEO BRONCHOSCOPY WITH ENDOBRONCHIAL NAVIGATION;  Surgeon: [name], MD;  Location: [place] ENDOSCOPY;  Service: Pulmonary;  Laterality: N/A;  . VIDEO BRONCHOSCOPY WITH ENDOBRONCHIAL ULTRASOUND N/A 10/23/2019   Procedure: VIDEO BRONCHOSCOPY WITH ENDOBRONCHIAL ULTRASOUND;  Surgeon: [name], MD;  Location: [place] ENDOSCOPY;  Service: Pulmonary;  Laterality: N/A;    REVIEW OF SYSTEMS:   Review of Systems  Constitutional: Positive for weight loss.  Negative for appetite change, chills, fatigue, and fever.  HENT: Negative for mouth sores, nosebleeds, sore throat and trouble swallowing.   Eyes: Negative for eye problems and icterus.  Respiratory: Positive for mild shortness of breath (improving). Negative for cough, hemoptysis, and wheezing.   Cardiovascular: Positive for pleuritic chest pain (improved). Negative for leg swelling.  Gastrointestinal: Negative for abdominal pain, constipation, diarrhea, nausea and vomiting.  Genitourinary: Negative for bladder incontinence, difficulty urinating, dysuria, frequency and hematuria.   Musculoskeletal: Negative for back pain, gait problem, neck pain and neck stiffness.  Skin: Negative for itching and rash.  Neurological: Negative for dizziness, extremity weakness, gait problem, headaches, light-headedness and seizures.  Hematological: Negative for adenopathy. Does not bruise/bleed easily.  Psychiatric/Behavioral: Negative for confusion, depression and sleep disturbance. The patient is not nervous/anxious.      PHYSICAL EXAMINATION:  Blood pressure 131/71,  pulse 66, temperature (!) 97.4 F (36.3 C), temperature source Tympanic, resp. rate 20, height '5\' 11"'  (1.803 m), weight 191 lb 11.2 oz (87 kg), SpO2 95 %.  ECOG PERFORMANCE STATUS: 1 - Symptomatic but completely ambulatory  Physical Exam  Constitutional: Oriented to person, place, and time and well-developed, well-nourished, and in no distress.  HENT:  Head: Normocephalic and atraumatic.  Mouth/Throat: Oropharynx is clear and moist. No oropharyngeal exudate.  Eyes: Conjunctivae are normal. Right eye exhibits no discharge. Left eye exhibits no discharge. No scleral icterus.  Neck: Normal range of motion. Neck supple.  Cardiovascular: Normal rate, regular rhythm, normal heart sounds and intact distal pulses.   Pulmonary/Chest: Effort normal. Quiet breath sounds in all lung fields. No respiratory distress. No wheezes. No rales.  Abdominal: Soft. Bowel sounds are normal. Exhibits no distension and no mass. There is no tenderness.  Musculoskeletal: Normal range of motion. Exhibits no edema.  Lymphadenopathy:    No cervical adenopathy.  Neurological: Alert and oriented to person, place, and time. Exhibits normal muscle tone. Gait normal. Coordination normal. Uses a cane for ambulation.  Skin: Skin is warm and dry. No rash noted. Not diaphoretic. No erythema. No pallor.  Psychiatric: Mood, memory and judgment normal.  Vitals reviewed.  LABORATORY DATA: Lab Results  Component Value Date   WBC 18.3 (H) 11/21/2019   HGB 14.2 11/21/2019   HCT 44.8 11/21/2019   MCV 92.8 11/21/2019   PLT 354 11/21/2019      Chemistry      Component Value Date/Time   NA 138 11/21/2019 0216   NA 143 03/10/2017 0911   K 4.5 11/21/2019 0216   K 4.7 03/10/2017 0911   CL 102 11/21/2019 0216   CO2 25 11/21/2019 0216   CO2 30 (H) 03/10/2017 0911   BUN 9 11/21/2019 0216   BUN 10.7 03/10/2017 0911   CREATININE 0.74 11/21/2019 0216   CREATININE 0.80 11/14/2019 1259   CREATININE 0.8 03/10/2017 0911       Component Value Date/Time   CALCIUM 8.9 11/21/2019 0216   CALCIUM 9.4 03/10/2017 0911   ALKPHOS 88 11/14/2019 1259   ALKPHOS 76 03/10/2017 0911   AST 18 11/14/2019 1259   AST 34 03/10/2017 0911   ALT 13 11/14/2019 1259   ALT 45 03/10/2017 0911   BILITOT 1.0 11/14/2019 1259   BILITOT 1.41 (H) 03/10/2017 0911       RADIOGRAPHIC STUDIES:  DG Chest 2 View  Result Date: 11/27/2019 CLINICAL DATA:  Postprocedural pneumothorax. Pneumothorax status post bronchoscopy. EXAM: CHEST - 2 VIEW COMPARISON:  Radiograph 11/22/2019, PET CT 11/09/2019 FINDINGS: Stable size and appearance of  the left apical pneumothorax from prior exam, small in size. This is visualized under the left posterior third rib is well as laterally. No interval change from prior exam. Left upper lobe mass again seen. Persistent but diminished blunting of the left costophrenic angle, possible small effusion. Background emphysema and chronic hyperinflation. No other interval change. IMPRESSION: 1. Stable size and appearance of left apical pneumothorax from prior exam, small in size. 2. Left upper lobe mass again seen. Decreased blunting of the left costophrenic angle, suggesting persistent but improving small pleural effusion. Electronically Signed   By: Keith Rake M.D.   On: 11/27/2019 15:38   MR Brain W Wo Contrast  Result Date: 11/24/2019 CLINICAL DATA:  Staging of non-small cell lung carcinoma EXAM: MRI HEAD WITHOUT AND WITH CONTRAST TECHNIQUE: Multiplanar, multiecho pulse sequences of the brain and surrounding structures were obtained without and with intravenous contrast. CONTRAST:  62m GADAVIST GADOBUTROL 1 MMOL/ML IV SOLN COMPARISON:  None. FINDINGS: Brain: No acute infarct, acute hemorrhage or extra-axial collection. Multifocal hyperintense T2-weighted signal within the white matter. There is generalized atrophy without lobar predilection. Chronic microhemorrhage in the right basal ganglia. Normal midline structures.  Vascular: Normal flow voids. Skull and upper cervical spine: Normal marrow signal. Sinuses/Orbits: Bilateral ocular lens replacements. Left scleral banding. Paranasal sinuses are clear. Other: None IMPRESSION: 1. No intracranial metastatic disease. 2. Generalized atrophy and chronic microvascular ischemia. Electronically Signed   By: KUlyses JarredM.D.   On: 11/24/2019 03:43   NM PET Image Initial (PI) Skull Base To Thigh  Result Date: 11/10/2019 CLINICAL DATA:  Initial treatment strategy for lung mass. EXAM: NUCLEAR MEDICINE PET SKULL BASE TO THIGH TECHNIQUE: 9.7 mCi F-18 FDG was injected intravenously. Full-ring PET imaging was performed from the skull base to thigh after the radiotracer. CT data was obtained and used for attenuation correction and anatomic localization. Fasting blood glucose: 86 mg/dl COMPARISON:  September 07, 2019 FINDINGS: Mediastinal blood pool activity: SUV max 3.24 Liver activity: SUV max NA NECK: No hypermetabolic lymph nodes in the neck. Incidental CT findings: None CHEST: Large LEFT upper lobe mass abutting the mediastinal border measuring approximately 7.2 x 4.7 cm, mass with similar appearance when compared to the previous imaging evaluation. (SUVmax = 20) AP window lymph node on the LEFT 1.4 cm (image 72, series 4) (SUVmax = 4.1) other small AP window lymph nodes showing slightly less FDG uptake on the same image., 3 additional lymph nodes in this area this is the dominant node in this location. Small lymph node between the pulmonary artery and the aorta (image 75, series 4) 7 mm (SUVmax = 4.4) RIGHT hilar lymph node 12 mm short axis (image 81, series 4) (SUVmax = 3.0) Small LEFT axillary lymph node nonspecific following recent COVID-19 vaccination displaced fatty hilum. Small nodule along the fissure in the RIGHT chest, along the minor fissure (image 35, series 8) 5 mm without FDG uptake beyond mediastinal blood pool Mildly nodular focus along the pleural surface in the RIGHT chest 7  mm (image 19, series 8) also without significant FDG uptake. (Image 74, series 4) 5 mm LEFT lower lobe pulmonary nodule along the pleural surface without significant FDG uptake. Incidental CT findings: Calcified atheromatous plaque of the thoracic aorta. Calcified coronary artery disease. No pericardial effusion. Esophagus grossly normal. Pulmonary emphysema. ABDOMEN/PELVIS: No abnormal hypermetabolic activity within the liver, pancreas, adrenal glands, or spleen. No hypermetabolic lymph nodes in the abdomen or pelvis. Incidental CT findings: Moderate to marked hepatic steatosis. Cholelithiasis without pericholecystic  stranding. Pancreas normal contour. Spleen normal in size and contour. Adrenal glands are normal. No hydronephrosis. No acute bowel process. Normal appendix. Calcified atheromatous plaque of the abdominal aorta without aneurysmal dilation. No adenopathy. No pelvic adenopathy by size criteria. SKELETON: At the L4 vertebral level there are foci of increased metabolic activity (maximum SUV 4.6) not corresponding well with any signs of degenerative change at this location and without visible lesion. There is scattered other areas of mild heterogeneity that remain at or near blood pool throughout the spine favored to represent mild heterogeneous marrow activity. Incidental CT findings: Posterior cervical spinal fusion and laminectomy in the mid cervical spine. Spinal degenerative changes. IMPRESSION: 1. LEFT upper lobe mass compatible with bronchogenic neoplasm, associated with mediastinal adenopathy. 2. Contralateral RIGHT hilar lymph node with very mild FDG uptake but with imaging features that remains suspicious for contralateral nodal involvement. 3. Heterogeneous marrow uptake in general in the spine but with more focal areas at the L4 level as described. No imaging correlate on CT. Generalized mild heterogeneity elsewhere in marrow spaces, this could represent similar process but is more pronounced in  terms of its FDG uptake. Lumbar spine MRI may be helpful for further assessment as clinically warranted. 4. Other small pulmonary nodules and nodular pleural based lesion as described without FDG uptake 5. Marked hepatic steatosis. 6. Cholelithiasis. 7. Atherosclerosis and pulmonary emphysema. Aortic Atherosclerosis (ICD10-I70.0) and Emphysema (ICD10-J43.9). Electronically Signed   By: Zetta Bills M.D.   On: 11/10/2019 13:19   DG Chest Port 1 View  Result Date: 11/22/2019 CLINICAL DATA:  Pneumothorax EXAM: PORTABLE CHEST 1 VIEW COMPARISON:  11/21/2019 and multiple prior studies FINDINGS: Trachea midline. Cardiomediastinal contours are stable. LEFT suprahilar and upper lobe mass is redemonstrated with persistent LEFT apical and lateral pneumothorax, unchanged with respect to size on frontal view. Blunting of LEFT costodiaphragmatic sulcus may reflect small effusion. There is a small sub pulmonic component to the pneumothorax previously, this is no longer evident. Emphysematous changes. RIGHT chest is clear. Visualized skeletal structures without acute process. IMPRESSION: 1. Unchanged LEFT apical and lateral pneumothorax. 2. Blunting of LEFT costodiaphragmatic sulcus may reflect small amount of pleural fluid in the LEFT chest. 3. LEFT suprahilar and upper lobe mass again noted. Electronically Signed   By: Zetta Bills M.D.   On: 11/22/2019 08:28   DG Chest Port 1 View  Result Date: 11/21/2019 CLINICAL DATA:  Pneumothorax after biopsy EXAM: PORTABLE CHEST 1 VIEW COMPARISON:  Earlier same day FINDINGS: Left apical pneumothorax is similar in size. Left upper lobe mass again noted. Emphysema. No new findings. Stable cardiomediastinal contours. IMPRESSION: Stable small left apical pneumothorax. Electronically Signed   By: Macy Mis M.D.   On: 11/21/2019 14:28   DG CHEST PORT 1 VIEW  Result Date: 11/21/2019 CLINICAL DATA:  Left pneumothorax. EXAM: PORTABLE CHEST 1 VIEW COMPARISON:  November 20, 2019.  FINDINGS: The heart size and mediastinal contours are within normal limits. Right lung is clear. Stable mild left apical pneumothorax is noted. Left upper lobe pulmonary mass is again noted. No significant pleural effusion is noted. The visualized skeletal structures are unremarkable. IMPRESSION: Stable mild left apical pneumothorax. Left upper lobe pulmonary mass is again noted. Aortic Atherosclerosis (ICD10-I70.0). Electronically Signed   By: Marijo Conception M.D.   On: 11/21/2019 08:07   DG CHEST PORT 1 VIEW  Result Date: 11/20/2019 CLINICAL DATA:  Pneumothorax EXAM: PORTABLE CHEST 1 VIEW COMPARISON:  11:55 a.m. FINDINGS: The lungs are mildly hyperinflated in keeping with  changes of underlying COPD. Small left apical pneumothorax is again identified has slightly increased in size since prior examination. No mediastinal shift or hyperexpansion of the left hemithorax to suggest tension physiology. Left upper lobe pulmonary mass is again noted. No pleural effusion. Cardiac size within normal limits. Pulmonary vascularity is normal. IMPRESSION: 1. Slight interval increase in size of small left apical pneumothorax. 2. Left upper lobe pulmonary mass again noted. 3. COPD. Electronically Signed   By: Fidela Salisbury MD   On: 11/20/2019 19:29   DG Chest Port 1 View  Result Date: 11/20/2019 CLINICAL DATA:  Status post bronchoscopy. EXAM: PORTABLE CHEST 1 VIEW COMPARISON:  10/23/2019 FINDINGS: Left upper lobe pulmonary mass again noted. Tiny left apical pneumothorax superimposed on the anterior left first rib. No pleural effusion. Interstitial markings are diffusely coarsened with chronic features. The cardiopericardial silhouette is within normal limits for size. The visualized bony structures of the thorax show now acute abnormality. Telemetry leads overlie the chest. IMPRESSION: 1. Tiny left apical pneumothorax superimposed on the anterior left first rib. 2. Left upper lobe pulmonary mass. These results will be  called to the ordering clinician or representative by the Radiologist Assistant, and communication documented in the PACS or [employer]. Electronically Signed   By: Misty Stanley M.D.   On: 11/20/2019 11:42   DG C-ARM BRONCHOSCOPY  Result Date: 11/20/2019 C-ARM BRONCHOSCOPY: Fluoroscopy was utilized by the requesting physician.  No radiographic interpretation.     ASSESSMENT/PLAN:  This is a very pleasant 74 year old Caucasian male referred to the clinic for evaluation of stage IIIb non-small cell lung cancer, squamous cell carcinoma.  The patient presented with a left upper lobe lung mass as well as associated mediastinal lymphadenopathy and contralateral right hilar mild hypermetabolism.  There was heterogeneous marrow uptake in general in the spine but with more focal areas on the L4 level without imaging correlation on CT.  We have requested his tissue be sent for PDL1 today 11/29/19.  He was diagnosed in August 2021.  The patient was seen with Dr. Julien Nordmann today.  Dr. Julien Nordmann had a lengthy discussion with the patient about his current condition and treatment options.  Dr. Julien Nordmann recommends that the patient proceed with concurrent chemoradiation with carboplatin for an AUC of 2 and paclitaxel 45 mg per metered squared.  The patient is interested in this option and he is expected to receive his first dose of treatment on 12/10/2019.  The adverse side effects of treatment were discussed including but not limited to nausea, vomiting, bone marrow suppression, alopecia, liver, and kidney dysfunction.  I will arrange for the patient to have a chemo education class prior to starting his first cycle of treatment.  I have sent a prescription for Compazine 10 mg p.o. every 6 hours as needed for nausea to the patient's pharmacy.  We will see the patient back for follow-up visit in 3 weeks for evaluation and before starting cycle #2.  I have placed a referral to radiation oncology.   The patient is  going to follow up with Dr. Lamonte Sakai next week for a follow up from his bronchoscopy.   The patient was advised to call immediately if he has any concerning symptoms in the interval. The patient voices understanding of current disease status and treatment options and is in agreement with the current care plan. All questions were answered. The patient knows to call the clinic with any problems, questions or concerns. We can certainly see the patient much sooner if necessary  Orders Placed This Encounter  Procedures  . CMP ([place] only)    Standing Status:   Standing    Number of Occurrences:   6    Standing Expiration Date:   11/28/2020  . CBC with Differential (Cancer Center Only)    Standing Status:   Standing    Number of Occurrences:   6    Standing Expiration Date:   11/28/2020  . Ambulatory referral to Radiation Oncology    Referral Priority:   Routine    Referral Type:   Consultation    Referral Reason:   Specialty Services Required    Requested Specialty:   Radiation Oncology    Number of Visits Requested:   [place], PA-C 11/29/19  ADDENDUM: Hematology/Oncology Attending: I had a face-to-face encounter with the patient today.  I recommended his care plan.  This is a very pleasant 74 years old white male recently diagnosed with stage IIIb (T3, N2, M0) non-small cell lung cancer, squamous cell carcinoma diagnosed in August 2021 and presented with left upper lobe lung mass in addition to mediastinal lymphadenopathy and contralateral right hilar suspicious lymph node. I had a lengthy discussion with the patient today about his current condition and treatment options.  I discussed with the patient the option of seeing a cardiothoracic surgery for discussion of surgical resection but the patient declined this option and also during the discussion at the thoracic conference earlier today, surgery did not feel that the patient will be a great surgical  candidate for resection because of the mediastinal lymphadenopathy. I discussed with the patient other treatment options and I recommended for him a course of concurrent chemoradiation with weekly carboplatin for AUC of 2 and paclitaxel 45 mg/M2 for 6-7 weeks followed by restaging scan and if the patient has no evidence for disease progression, he could benefit from consolidation treatment with immunotherapy. I discussed with the patient the adverse effect of this treatment including but not limited to alopecia, myelosuppression, nausea and vomiting, peripheral neuropathy, liver or renal dysfunction. The patient is interested in proceeding with a course of concurrent chemoradiation. We will arrange for him to see radiation oncology for discussion of the radiotherapy option. The patient is expected to start the first cycle of his treatment on 12/10/2019. He had MRI of the brain that showed no evidence of metastatic disease to the brain. We will arrange for the patient to have a chemotherapy education class before the first dose of his treatment. He will come back for follow-up visit on 12/17/2019 for management of any adverse effect of his treatment. The patient was advised to call immediately if he has any concerning symptoms in the interval.  Disclaimer: This note was dictated with voice recognition software. Similar sounding words can inadvertently be transcribed and may be missed upon review. Eilleen Kempf, MD 11/29/19

## 2019-11-27 NOTE — Patient Instructions (Addendum)
You were seen today by Lauraine Rinne, NP  for:   1. Mass of upper lobe of left lung  Continue follow up with oncology   2. COPD mixed type ([place])  - Pulmonary function test; Future  3. Postprocedural pneumothorax  - DG Chest 2 View; Future  4. OSA on CPAP  We recommend that you continue using your CPAP daily >>>Keep up the hard work using your device >>> Goal should be wearing this for the entire night that you are sleeping, at least 4 to 6 hours  Remember:   Do not drive or operate heavy machinery if tired or drowsy.   Please notify the supply company and office if you are unable to use your device regularly due to missing supplies or machine being broken.   Work on maintaining a healthy weight and following your recommended nutrition plan   Maintain proper daily exercise and movement   Maintaining proper use of your device can also help improve management of other chronic illnesses such as: Blood pressure, blood sugars, and weight management.   BiPAP/ CPAP Cleaning:  >>>Clean weekly, with Dawn soap, and bottle brush.  Set up to air dry. >>> Wipe mask out daily with wet wipe or towelette  5. Healthcare maintenance  Recommend seasonal flu vaccine in fall/2021    We recommend today:  Orders Placed This Encounter  Procedures   DG Chest 2 View    Standing Status:   Future    Standing Expiration Date:   03/28/2020    Order Specific Question:   Reason for Exam (SYMPTOM  OR DIAGNOSIS REQUIRED)    Answer:   hx of pneumo, s/p bronch    Order Specific Question:   Preferred imaging location?    Answer:   Internal    Order Specific Question:   Radiology Contrast Protocol - do NOT remove file path    Answer:   [email or web address]   Pulmonary function test    Standing Status:   Future    Standing Expiration Date:   11/26/2020    Order Specific Question:   Where should this test be performed?    Answer:   [facility]     Order Specific Question:   Full PFT: includes the following: basic spirometry, spirometry pre & post bronchodilator, diffusion capacity (DLCO), lung volumes    Answer:   Full PFT   Orders Placed This Encounter  Procedures   DG Chest 2 View   Pulmonary function test   No orders of the defined types were placed in this encounter.   Follow Up:    Return in about 6 weeks (around 01/08/2020), or if symptoms worsen or fail to improve, for Follow up for FULL PFT - 60 min, Follow up with Dr. Lamonte Sakai. KEEP SCHEDULED [facility] PT  SCHEDULE 60 min PFT   Notification of test results are managed in the following manner: If there are  any recommendations or changes to the  plan of care discussed in office today,  we will contact you and let you know what they are. If you do not hear from [place], then your results are normal and you can view them through your  MyChart account , or a letter will be sent to you. Thank you again for trusting [place] with your care  - Thank you, [facility]    It is flu season:   >>> Best ways to protect herself from the flu: Receive the yearly flu vaccine, practice good hand hygiene washing  with soap and also using hand sanitizer when available, eat a nutritious meals, get adequate rest, hydrate appropriately       Please contact the office if your symptoms worsen or you have concerns that you are not improving.   Thank you for choosing [facility] for your healthcare, and for allowing [place] to partner with you on your healthcare journey. I am thankful to be able to provide care to you today.   Wyn Quaker FNP-C

## 2019-11-27 NOTE — Assessment & Plan Note (Signed)
Former smoker Needs pulmonary function testing  Plan: We will order pulmonary function testing today

## 2019-11-27 NOTE — Progress Notes (Signed)
Subjective:  Patient ID: Joe [name], male    DOB: Dec 10, 1945  Age: 74 y.o. MRN: [id number]  CC: No chief complaint on file.   HPI Joe [name] presents for lung CA , COPD, pneumothorax f/u  Outpatient Medications Prior to Visit  Medication Sig Dispense Refill  . amLODipine (NORVASC) 5 MG tablet TAKE 1 TABLET BY MOUTH EVERY DAY (Patient taking differently: Take 5 mg by mouth daily. Midday) 90 tablet 3  . apixaban (ELIQUIS) 5 MG TABS tablet Take 1 tablet (5 mg total) by mouth 2 (two) times daily. Okay to restart this medication on 11/23/2019 60 tablet   . atenolol (TENORMIN) 25 MG tablet Take 1 tablet (25 mg total) by mouth daily as needed (for palpitations). 90 tablet 3  . Cholecalciferol (EQL VITAMIN D3) 1000 UNITS tablet Take 1,000 Units by mouth daily.      . diazepam (VALIUM) 5 MG tablet Take 1 tablet (5 mg total) by mouth daily.    [name] levothyroxine (SYNTHROID) 50 MCG tablet TAKE 1 TABLET BY MOUTH EVERY DAY (Patient taking differently: Take 50 mcg by mouth daily before breakfast. ) 90 tablet 3  . lovastatin (MEVACOR) 20 MG tablet TAKE 1 TABLET BY MOUTH EVERY NIGHT AT BEDTIME (Patient taking differently: Take 20 mg by mouth at bedtime. ) 90 tablet 3  . Multiple Vitamin (MULTIVITAMIN) tablet Take 1 tablet by mouth daily. Centrum Silver.    Vladimir Faster Glycol-Propyl Glycol (SYSTANE) 0.4-0.3 % SOLN Place 1 drop into both eyes daily as needed (Dry eyes). Ultra     No facility-administered medications prior to visit.    ROS: Review of Systems  Constitutional: Positive for fatigue. Negative for appetite change and unexpected weight change.  HENT: Negative for congestion, nosebleeds, sneezing, sore throat and trouble swallowing.   Eyes: Negative for itching and visual disturbance.  Respiratory: Positive for shortness of breath. Negative for cough.   Cardiovascular: Negative for chest pain, palpitations and leg swelling.  Gastrointestinal: Negative for abdominal distention, blood  in stool, diarrhea and nausea.  Genitourinary: Negative for frequency and hematuria.  Musculoskeletal: Positive for gait problem. Negative for back pain, joint swelling and neck pain.  Skin: Negative for rash.  Neurological: Positive for weakness. Negative for dizziness, tremors and speech difficulty.  Psychiatric/Behavioral: Negative for agitation, decreased concentration, dysphoric mood, sleep disturbance and suicidal ideas. The patient is nervous/anxious.     Objective:  BP 124/78 (BP Location: Left Arm, Patient Position: Sitting, Cuff Size: Large)   Pulse 67   Temp 98.5 F (36.9 C) (Oral)   Ht 5\' 11"  (1.803 m)   Wt 198 lb (89.8 kg)   SpO2 93%   BMI 27.62 kg/m   BP Readings from Last 3 Encounters:  11/27/19 124/78  11/22/19 126/71  11/14/19 135/72    Wt Readings from Last 3 Encounters:  11/27/19 198 lb (89.8 kg)  11/20/19 192 lb (87.1 kg)  11/14/19 192 lb 1.6 oz (87.1 kg)    Physical Exam Constitutional:      General: He is not in acute distress.    Appearance: He is well-developed.     Comments: NAD  Eyes:     Conjunctiva/sclera: Conjunctivae normal.     Pupils: Pupils are equal, round, and reactive to light.  Neck:     Thyroid: No thyromegaly.     Vascular: No JVD.  Cardiovascular:     Rate and Rhythm: Normal rate and regular rhythm.     Heart sounds: Normal heart sounds. No murmur  heard.  No friction rub. No gallop.   Pulmonary:     Effort: Pulmonary effort is normal. No respiratory distress.     Breath sounds: Normal breath sounds. No wheezing or rales.  Chest:     Chest wall: No tenderness.  Abdominal:     General: Bowel sounds are normal. There is no distension.     Palpations: Abdomen is soft. There is no mass.     Tenderness: There is no abdominal tenderness. There is no guarding or rebound.  Musculoskeletal:        General: No tenderness. Normal range of motion.     Cervical back: Normal range of motion.  Lymphadenopathy:     Cervical: No  cervical adenopathy.  Skin:    General: Skin is warm and dry.     Findings: No rash.  Neurological:     Mental Status: He is alert and oriented to person, place, and time.     Cranial Nerves: No cranial nerve deficit.     Motor: No abnormal muscle tone.     Coordination: Coordination abnormal.     Gait: Gait abnormal.     Deep Tendon Reflexes: Reflexes are normal and symmetric.  Psychiatric:        Behavior: Behavior normal.        Thought Content: Thought content normal.        Judgment: Judgment normal.    Cane  Lab Results  Component Value Date   WBC 18.3 (H) 11/21/2019   HGB 14.2 11/21/2019   HCT 44.8 11/21/2019   PLT 354 11/21/2019   GLUCOSE 123 (H) 11/21/2019   CHOL 153 11/23/2018   TRIG 201.0 (H) 11/23/2018   HDL 38.20 (L) 11/23/2018   LDLDIRECT 99.0 11/23/2018   LDLCALC 80 11/07/2017   ALT 13 11/14/2019   AST 18 11/14/2019   NA 138 11/21/2019   K 4.5 11/21/2019   CL 102 11/21/2019   CREATININE 0.74 11/21/2019   BUN 9 11/21/2019   CO2 25 11/21/2019   TSH 3.89 05/23/2019   PSA 0.23 11/23/2018   INR 1.4 (H) 10/15/2019    MR Brain W Wo Contrast  Result Date: 11/24/2019 CLINICAL DATA:  Staging of non-small cell lung carcinoma EXAM: MRI HEAD WITHOUT AND WITH CONTRAST TECHNIQUE: Multiplanar, multiecho pulse sequences of the brain and surrounding structures were obtained without and with intravenous contrast. CONTRAST:  23mL GADAVIST GADOBUTROL 1 MMOL/ML IV SOLN COMPARISON:  None. FINDINGS: Brain: No acute infarct, acute hemorrhage or extra-axial collection. Multifocal hyperintense T2-weighted signal within the white matter. There is generalized atrophy without lobar predilection. Chronic microhemorrhage in the right basal ganglia. Normal midline structures. Vascular: Normal flow voids. Skull and upper cervical spine: Normal marrow signal. Sinuses/Orbits: Bilateral ocular lens replacements. Left scleral banding. Paranasal sinuses are clear. Other: None IMPRESSION: 1. No  intracranial metastatic disease. 2. Generalized atrophy and chronic microvascular ischemia. Electronically Signed   By: Ulyses Jarred M.D.   On: 11/24/2019 03:43    Assessment & Plan:   There are no diagnoses linked to this encounter.   No orders of the defined types were placed in this encounter.    Follow-up: No follow-ups on file.  Walker Kehr, MD

## 2019-11-27 NOTE — Progress Notes (Signed)
@Patient  ID: Joe [name], male    DOB: May 05, 1945, 74 y.o.   MRN: [id number]  Chief Complaint  Patient presents with  . 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 Lamonte Sakai  11/27/2019  - Visit   74 year old male former smoker on our office by Dr. Lamonte Sakai for COPD, squamous cell carcinoma.  Patient was last seen in our office on 10/15/2019.  Patient was recommended at that time to have a bronchoscopy with Dr. Lamonte Sakai.  It was felt that he likely has COPD.  It was recommended that he start empiric bronchodilators, patient wanted to hold off on that.  We will obtain pulmonary function testing.  Patient was encouraged to remain on CPAP therapy and had good compliance.  Patient had a bronchoscopy in beginning of August and those studies were inconclusive so who was discussed that a multi disciplinary thoracic conference and patient had repeat bronchoscopy done on 11/20/2019 pathology from that report shows squamous cell carcinoma patient is establishing care with oncology.  Patient has obtained an MRI of his brain on 11/24/2019.  Showing no intracranial metastatic disease.  PET scan from 11/10/2019 showed-left upper lobe mass compatible with bronchogenic neoplasm associated mediastinal adenopathy.  Patient presenting to our office today reporting that shortness of breath has improved.  He is establish care with oncology has an upcoming appointment later on this week.  Patient is wondering if we will need to do another x-ray today to follow his no pneumothorax as well as pleural effusion.  Questionaires / Pulmonary Flowsheets:   ACT:  No flowsheet data  found.  MMRC: No flowsheet data found.  Epworth:  No flowsheet data found.  Tests:   FENO:  No results found for: NITRICOXIDE  PFT: No flowsheet data found.  WALK:  No flowsheet data found.  Imaging: DG Chest 2 View  Result Date: 11/27/2019 CLINICAL DATA:  Postprocedural pneumothorax. Pneumothorax status post bronchoscopy. EXAM: CHEST - 2 VIEW COMPARISON:  Radiograph 11/22/2019, PET CT 11/09/2019 FINDINGS: Stable size and appearance of the left apical pneumothorax from prior exam, small in size. This is visualized under the left posterior third rib is well as laterally. No interval change from prior exam. Left upper lobe mass again seen. Persistent but diminished blunting of the left costophrenic angle, possible small effusion. Background emphysema and chronic hyperinflation. No other interval change. IMPRESSION: 1. Stable size and appearance of left apical pneumothorax from prior exam, small in size. 2. Left upper lobe mass again seen. Decreased blunting of the left costophrenic angle, suggesting persistent but improving small pleural effusion. Electronically Signed   By: Keith Rake M.D.   On: 11/27/2019 15:38   MR Brain W Wo Contrast  Result Date: 11/24/2019 CLINICAL DATA:  Staging of non-small cell lung carcinoma EXAM: MRI HEAD WITHOUT AND WITH CONTRAST TECHNIQUE: Multiplanar, multiecho pulse sequences of the brain and surrounding structures were obtained without and with intravenous contrast. CONTRAST:  19mL GADAVIST GADOBUTROL 1 MMOL/ML IV SOLN COMPARISON:  None. FINDINGS: Brain: No acute infarct, acute hemorrhage or extra-axial collection. Multifocal hyperintense T2-weighted signal within the white matter. There is generalized atrophy without lobar predilection. Chronic microhemorrhage in the right basal ganglia. Normal midline structures. Vascular: Normal  flow voids. Skull and upper cervical spine: Normal marrow signal. Sinuses/Orbits: Bilateral ocular lens replacements. Left  scleral banding. Paranasal sinuses are clear. Other: None IMPRESSION: 1. No intracranial metastatic disease. 2. Generalized atrophy and chronic microvascular ischemia. Electronically Signed   By: Ulyses Jarred M.D.   On: 11/24/2019 03:43   NM PET Image Initial (PI) Skull Base To Thigh  Result Date: 11/10/2019 CLINICAL DATA:  Initial treatment strategy for lung mass. EXAM: NUCLEAR MEDICINE PET SKULL BASE TO THIGH TECHNIQUE: 9.7 mCi F-18 FDG was injected intravenously. Full-ring PET imaging was performed from the skull base to thigh after the radiotracer. CT data was obtained and used for attenuation correction and anatomic localization. Fasting blood glucose: 86 mg/dl COMPARISON:  September 07, 2019 FINDINGS: Mediastinal blood pool activity: SUV max 3.24 Liver activity: SUV max NA NECK: No hypermetabolic lymph nodes in the neck. Incidental CT findings: None CHEST: Large LEFT upper lobe mass abutting the mediastinal border measuring approximately 7.2 x 4.7 cm, mass with similar appearance when compared to the previous imaging evaluation. (SUVmax = 20) AP window lymph node on the LEFT 1.4 cm (image 72, series 4) (SUVmax = 4.1) other small AP window lymph nodes showing slightly less FDG uptake on the same image., 3 additional lymph nodes in this area this is the dominant node in this location. Small lymph node between the pulmonary artery and the aorta (image 75, series 4) 7 mm (SUVmax = 4.4) RIGHT hilar lymph node 12 mm short axis (image 81, series 4) (SUVmax = 3.0) Small LEFT axillary lymph node nonspecific following recent COVID-19 vaccination displaced fatty hilum. Small nodule along the fissure in the RIGHT chest, along the minor fissure (image 35, series 8) 5 mm without FDG uptake beyond mediastinal blood pool Mildly nodular focus along the pleural surface in the RIGHT chest 7 mm (image 19, series 8) also without significant FDG uptake. (Image 74, series 4) 5 mm LEFT lower lobe pulmonary nodule along the pleural  surface without significant FDG uptake. Incidental CT findings: Calcified atheromatous plaque of the thoracic aorta. Calcified coronary artery disease. No pericardial effusion. Esophagus grossly normal. Pulmonary emphysema. ABDOMEN/PELVIS: No abnormal hypermetabolic activity within the liver, pancreas, adrenal glands, or spleen. No hypermetabolic lymph nodes in the abdomen or pelvis. Incidental CT findings: Moderate to marked hepatic steatosis. Cholelithiasis without pericholecystic stranding. Pancreas normal contour. Spleen normal in size and contour. Adrenal glands are normal. No hydronephrosis. No acute bowel process. Normal appendix. Calcified atheromatous plaque of the abdominal aorta without aneurysmal dilation. No adenopathy. No pelvic adenopathy by size criteria. SKELETON: At the L4 vertebral level there are foci of increased metabolic activity (maximum SUV 4.6) not corresponding well with any signs of degenerative change at this location and without visible lesion. There is scattered other areas of mild heterogeneity that remain at or near blood pool throughout the spine favored to represent mild heterogeneous marrow activity. Incidental CT findings: Posterior cervical spinal fusion and laminectomy in the mid cervical spine. Spinal degenerative changes. IMPRESSION: 1. LEFT upper lobe mass compatible with bronchogenic neoplasm, associated with mediastinal adenopathy. 2. Contralateral RIGHT hilar lymph node with very mild FDG uptake but with imaging features that remains suspicious for contralateral nodal involvement. 3. Heterogeneous marrow uptake in general in the spine but with more focal areas at the L4 level as described. No imaging correlate on CT. Generalized mild heterogeneity elsewhere in marrow spaces, this could represent similar process but is more pronounced in terms of its FDG uptake. Lumbar spine MRI may  be helpful for further assessment as clinically warranted. 4. Other small pulmonary nodules  and nodular pleural based lesion as described without FDG uptake 5. Marked hepatic steatosis. 6. Cholelithiasis. 7. Atherosclerosis and pulmonary emphysema. Aortic Atherosclerosis (ICD10-I70.0) and Emphysema (ICD10-J43.9). Electronically Signed   By: Zetta Bills M.D.   On: 11/10/2019 13:19   DG Chest Port 1 View  Result Date: 11/22/2019 CLINICAL DATA:  Pneumothorax EXAM: PORTABLE CHEST 1 VIEW COMPARISON:  11/21/2019 and multiple prior studies FINDINGS: Trachea midline. Cardiomediastinal contours are stable. LEFT suprahilar and upper lobe mass is redemonstrated with persistent LEFT apical and lateral pneumothorax, unchanged with respect to size on frontal view. Blunting of LEFT costodiaphragmatic sulcus may reflect small effusion. There is a small sub pulmonic component to the pneumothorax previously, this is no longer evident. Emphysematous changes. RIGHT chest is clear. Visualized skeletal structures without acute process. IMPRESSION: 1. Unchanged LEFT apical and lateral pneumothorax. 2. Blunting of LEFT costodiaphragmatic sulcus may reflect small amount of pleural fluid in the LEFT chest. 3. LEFT suprahilar and upper lobe mass again noted. Electronically Signed   By: Zetta Bills M.D.   On: 11/22/2019 08:28   DG Chest Port 1 View  Result Date: 11/21/2019 CLINICAL DATA:  Pneumothorax after biopsy EXAM: PORTABLE CHEST 1 VIEW COMPARISON:  Earlier same day FINDINGS: Left apical pneumothorax is similar in size. Left upper lobe mass again noted. Emphysema. No new findings. Stable cardiomediastinal contours. IMPRESSION: Stable small left apical pneumothorax. Electronically Signed   By: Macy Mis M.D.   On: 11/21/2019 14:28   DG CHEST PORT 1 VIEW  Result Date: 11/21/2019 CLINICAL DATA:  Left pneumothorax. EXAM: PORTABLE CHEST 1 VIEW COMPARISON:  November 20, 2019. FINDINGS: The heart size and mediastinal contours are within normal limits. Right lung is clear. Stable mild left apical pneumothorax is noted.  Left upper lobe pulmonary mass is again noted. No significant pleural effusion is noted. The visualized skeletal structures are unremarkable. IMPRESSION: Stable mild left apical pneumothorax. Left upper lobe pulmonary mass is again noted. Aortic Atherosclerosis (ICD10-I70.0). Electronically Signed   By: Marijo Conception M.D.   On: 11/21/2019 08:07   DG CHEST PORT 1 VIEW  Result Date: 11/20/2019 CLINICAL DATA:  Pneumothorax EXAM: PORTABLE CHEST 1 VIEW COMPARISON:  11:55 a.m. FINDINGS: The lungs are mildly hyperinflated in keeping with changes of underlying COPD. Small left apical pneumothorax is again identified has slightly increased in size since prior examination. No mediastinal shift or hyperexpansion of the left hemithorax to suggest tension physiology. Left upper lobe pulmonary mass is again noted. No pleural effusion. Cardiac size within normal limits. Pulmonary vascularity is normal. IMPRESSION: 1. Slight interval increase in size of small left apical pneumothorax. 2. Left upper lobe pulmonary mass again noted. 3. COPD. Electronically Signed   By: Fidela Salisbury MD   On: 11/20/2019 19:29   DG Chest Port 1 View  Result Date: 11/20/2019 CLINICAL DATA:  Status post bronchoscopy. EXAM: PORTABLE CHEST 1 VIEW COMPARISON:  10/23/2019 FINDINGS: Left upper lobe pulmonary mass again noted. Tiny left apical pneumothorax superimposed on the anterior left first rib. No pleural effusion. Interstitial markings are diffusely coarsened with chronic features. The cardiopericardial silhouette is within normal limits for size. The visualized bony structures of the thorax show now acute abnormality. Telemetry leads overlie the chest. IMPRESSION: 1. Tiny left apical pneumothorax superimposed on the anterior left first rib. 2. Left upper lobe pulmonary mass. These results will be called to the ordering clinician or representative by the Radiologist  Environmental consultant, and communication documented in the PACS or [employer].  Electronically Signed   By: Misty Stanley M.D.   On: 11/20/2019 11:42   DG C-ARM BRONCHOSCOPY  Result Date: 11/20/2019 C-ARM BRONCHOSCOPY: Fluoroscopy was utilized by the requesting physician.  No radiographic interpretation.    Lab Results:  CBC    Component Value Date/Time   WBC 18.3 (H) 11/21/2019 0216   RBC 4.83 11/21/2019 0216   HGB 14.2 11/21/2019 0216   HGB 15.8 11/14/2019 1259   HGB 17.2 (H) 03/10/2017 0911   HCT 44.8 11/21/2019 0216   HCT 52.5 (H) 03/10/2017 0911   PLT 354 11/21/2019 0216   PLT 364 11/14/2019 1259   PLT 241 03/10/2017 0911   MCV 92.8 11/21/2019 0216   MCV 96.7 03/10/2017 0911   MCH 29.4 11/21/2019 0216   MCHC 31.7 11/21/2019 0216   RDW 13.5 11/21/2019 0216   RDW 13.6 03/10/2017 0911   LYMPHSABS 6.6 (H) 11/14/2019 1259   LYMPHSABS 9.8 (H) 03/10/2017 0911   MONOABS 1.4 (H) 11/14/2019 1259   MONOABS 1.0 (H) 03/10/2017 0911   EOSABS 0.2 11/14/2019 1259   EOSABS 0.3 03/10/2017 0911   BASOSABS 0.1 11/14/2019 1259   BASOSABS 0.1 03/10/2017 0911    BMET    Component Value Date/Time   NA 138 11/21/2019 0216   NA 143 03/10/2017 0911   K 4.5 11/21/2019 0216   K 4.7 03/10/2017 0911   CL 102 11/21/2019 0216   CO2 25 11/21/2019 0216   CO2 30 (H) 03/10/2017 0911   GLUCOSE 123 (H) 11/21/2019 0216   GLUCOSE 90 03/10/2017 0911   BUN 9 11/21/2019 0216   BUN 10.7 03/10/2017 0911   CREATININE 0.74 11/21/2019 0216   CREATININE 0.80 11/14/2019 1259   CREATININE 0.8 03/10/2017 0911   CALCIUM 8.9 11/21/2019 0216   CALCIUM 9.4 03/10/2017 0911   GFRNONAA >60 11/21/2019 0216   GFRNONAA >60 11/14/2019 1259   GFRAA >60 11/21/2019 0216   GFRAA >60 11/14/2019 1259    BNP No results found for: BNP  ProBNP No results found for: PROBNP  Specialty Problems      Pulmonary Problems   COPD mixed type ([place])    Smoker - stopped 6/14      DOE (dyspnea on exertion)    1/16 deconditioning, COPD vs other  8/21 Squamous cell carcinoma, , recent L  pneumothorax        OSA (obstructive sleep apnea)   OSA on CPAP    mild with AHI 9/hr and oxygen desats as low as 75%      Upper respiratory infection    5/21      Mass of upper lobe of left lung    10/2019 Squamous cell carcinoma Dr Julien Nordmann      Pneumothorax   Pneumothorax, left      No Known Allergies  Immunization History  Administered Date(s) Administered  . Fluad Quad(high Dose 65+) 11/23/2018  . Influenza Split 02/02/2012  . Influenza Whole 12/23/2005  . Influenza, High Dose Seasonal PF 12/11/2015, 12/23/2016, 12/29/2017  . Influenza,inj,Quad PF,6+ Mos 12/13/2012, 12/13/2013, 12/25/2014  . PFIZER SARS-COV-2 Vaccination 05/20/2019, 06/13/2019  . Pneumococcal Conjugate-13 03/12/2013  . Pneumococcal Polysaccharide-23 06/12/2013  . Td 05/14/2009  . Tdap 11/27/2019    Past Medical History:  Diagnosis Date  . Alcohol abuse, daily use 08/24/2010   Stopped 2015   . Arthralgia 12/13/2013   9/15 Not related to statins OA   . Cataract   . 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 CLL (chronic lymphocytic leukemia) ([place]) 08/08/2014   22-Jun-2014 Dr Burr Medico Stage 0  . COPD mixed type ([place]) 07/26/2007   Smoker - stopped 6/14   . De Quervain's tenosynovitis, right January 05, 2014   21-Jun-2013   . Depression    at times  . Dysrhythmia    PAF  . Dysuria 01/05/14   9/15 - poss stricture Urol ref was offered   . Gallstones 11/16/2017   Asymptomatic Pt refused surg ref  . Generalized anxiety disorder 09/07/2012   Chronic   Potential benefits of a long term steroid  use as well as potential risks  and complications were explained to the patient and were aknowledged.     [name] GERD 12/02/2006   Chronic     . Grief 06-Jun-2016   [name] died in 2014/06/22  . Gynecomastia 2014-01-05   Benign B 21-Jun-2013   . Hyperlipidemia   . Hypertension   . Hypothyroidism 12/25/2014   2014/06/22 On Levothyroxine   . Intertrigo 02/02/2012   11/13   . Neoplasm of uncertain behavior of skin  03/12/2013   12/14 R ear, chest   . OSA on CPAP    mild with AHI 9/hr and oxygen desats as low as 75%  . 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 PERIORBITAL CELLULITIS 02/22/2009   Qualifier: Diagnosis of  By: Diona Browner MD, Amy    . 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 Packs/day: 0.80  . Years: 50.00  . Pack years: 40.00  . Types: Cigarettes  . Quit date: 08/27/2012  . Years since quitting: 7.2  Smokeless Tobacco Never Used   Counseling given: Yes   Continue to not smoke  Outpatient Encounter Medications as of 11/27/2019  Medication Sig  . amLODipine (NORVASC) 5 MG tablet Take 1 tablet (5 mg total) by mouth daily.  [name] apixaban (ELIQUIS) 5 MG TABS tablet Take 1 tablet (5 mg total) by mouth 2 (two) times daily. Okay to restart this medication on 11/23/2019  . atenolol (TENORMIN) 25 MG tablet Take 1 tablet (25 mg total) by mouth daily as needed (for palpitations).  . Cholecalciferol (EQL VITAMIN D3) 1000 UNITS tablet Take 1,000 Units by mouth daily.    . diazepam (VALIUM) 5 MG tablet Take 1 tablet (5 mg total) by mouth daily.  [name] levothyroxine (SYNTHROID) 50 MCG tablet Take 1 tablet (50 mcg total) by mouth daily.  [name] lovastatin (MEVACOR) 20 MG tablet Take 1 tablet (20 mg total) by mouth at bedtime.  . Multiple Vitamin (MULTIVITAMIN) tablet Take 1 tablet by mouth daily. Centrum Silver.  Vladimir Faster Glycol-Propyl Glycol (SYSTANE) 0.4-0.3 % SOLN Place 1 drop into both eyes daily as needed (Dry eyes). Ultra   No facility-administered encounter medications on file as of 11/27/2019.     Review of Systems  Review of Systems  Constitutional: Negative for activity change, chills, fatigue, fever and unexpected weight change.  HENT: Negative for postnasal drip, rhinorrhea, sinus pressure, sinus pain and sore throat.   Eyes: Negative.   Respiratory: Positive for shortness of breath. Negative for cough and  wheezing.   Cardiovascular: Negative for chest pain and palpitations.  Gastrointestinal: Negative for constipation, diarrhea, nausea and vomiting.  Endocrine: Negative.   Genitourinary: Negative.   Musculoskeletal: Negative.   Skin: Negative.   Neurological: Negative for dizziness and headaches.  Psychiatric/Behavioral: Negative.  Negative for dysphoric mood. The patient is not nervous/anxious.   All other systems reviewed and are negative.    Physical Exam  BP 120/70 (BP Location: Left Arm, Cuff Size: Normal)   Pulse 72   Temp (!) 97.3 F (36.3 C) (Oral)   Ht 5\' 11"  (1.803 m)   Wt 190 lb 1.6 oz (86.2 kg)   SpO2 92%   BMI 26.51 kg/m   Wt Readings from Last 5 Encounters:  11/27/19 190 lb 1.6 oz (86.2 kg)  11/27/19 198 lb (89.8 kg)  11/20/19 192 lb (87.1 kg)  11/14/19 192 lb 1.6 oz (87.1 kg)  11/05/19 190 lb 12.8 oz (86.5 kg)    BMI Readings from Last 5 Encounters:  11/27/19 26.51 kg/m  11/27/19 27.62 kg/m  11/20/19 26.78 kg/m  11/14/19 26.79 kg/m  11/05/19 26.61 kg/m     Physical Exam Vitals and nursing note reviewed.  Constitutional:      General: He is not in acute distress.    Appearance: Normal appearance. He is obese.  HENT:     Head: Normocephalic and atraumatic.     Right Ear: Hearing, tympanic membrane, ear canal and external ear normal. There is no impacted cerumen.     Left Ear: Hearing, tympanic membrane, ear canal and external ear normal. There is no impacted cerumen.     Nose: Nose normal. No mucosal edema or rhinorrhea.     Right Turbinates: Not enlarged.     Left Turbinates: Not enlarged.     Mouth/Throat:     Mouth: Mucous membranes are dry.     Pharynx: Oropharynx is clear. No oropharyngeal exudate.  Eyes:     Pupils: Pupils are equal, round, and reactive to light.  Cardiovascular:     Rate and Rhythm: Normal rate and regular rhythm.     Pulses: Normal pulses.     Heart sounds: Normal heart sounds. No murmur heard.   Pulmonary:      Effort: Pulmonary effort is normal.     Breath sounds: Normal breath sounds. No decreased breath sounds, wheezing or rales.  Musculoskeletal:     Cervical back: Normal range of motion.     Right lower leg: No edema.     Left lower leg: No edema.  Lymphadenopathy:     Cervical: No cervical adenopathy.  Skin:    General: Skin is warm and dry.     Capillary Refill: Capillary refill takes less than 2 seconds.     Findings: No erythema or rash.  Neurological:     General: No focal deficit present.     Mental Status: He is alert and oriented to person, place, and time.     Motor: No weakness.     Coordination: Coordination normal.     Gait: Gait is intact. Gait normal.  Psychiatric:        Mood and Affect: Mood normal.        Behavior: Behavior normal. Behavior is cooperative.        Thought Content: Thought content normal.        Judgment: Judgment normal.       Assessment & Plan:   Pneumothorax Status post bronchoscopy  Plan: Chest x-ray today  OSA on CPAP Plan: Continue CPAP therapy  COPD mixed type Former smoker Needs pulmonary function testing  Plan: We will order pulmonary function testing today  Mass of upper lobe of left lung Plan: Continue follow-up with oncology   Healthcare maintenance Plan: Recommend  seasonal flu vaccine    Return in about 6 weeks (around 01/08/2020), or if symptoms worsen or fail to improve, for Follow up for FULL PFT - 60 min, Follow up with Dr. Lamonte Sakai.   Lauraine Rinne, NP 11/27/2019   This appointment required 32 minutes of patient care (this includes precharting, chart review, review of results, face-to-face care, etc.).

## 2019-11-27 NOTE — Assessment & Plan Note (Signed)
Discussed - Squamous cell carcinoma, recent L pneumothorax appt w/Dr Julien Nordmann

## 2019-11-27 NOTE — Assessment & Plan Note (Signed)
On Eliquis

## 2019-11-29 ENCOUNTER — Encounter: Payer: Self-pay | Admitting: Physician Assistant

## 2019-11-29 ENCOUNTER — Other Ambulatory Visit: Payer: Self-pay | Admitting: Internal Medicine

## 2019-11-29 ENCOUNTER — Other Ambulatory Visit: Payer: Self-pay

## 2019-11-29 ENCOUNTER — Inpatient Hospital Stay: Payer: Medicare Other | Attending: Hematology | Admitting: Physician Assistant

## 2019-11-29 VITALS — BP 131/71 | HR 66 | Temp 97.4°F | Resp 20 | Ht 71.0 in | Wt 191.7 lb

## 2019-11-29 DIAGNOSIS — I10 Essential (primary) hypertension: Secondary | ICD-10-CM | POA: Diagnosis not present

## 2019-11-29 DIAGNOSIS — Z5111 Encounter for antineoplastic chemotherapy: Secondary | ICD-10-CM | POA: Insufficient documentation

## 2019-11-29 DIAGNOSIS — Z7901 Long term (current) use of anticoagulants: Secondary | ICD-10-CM | POA: Diagnosis not present

## 2019-11-29 DIAGNOSIS — Z79899 Other long term (current) drug therapy: Secondary | ICD-10-CM | POA: Insufficient documentation

## 2019-11-29 DIAGNOSIS — I480 Paroxysmal atrial fibrillation: Secondary | ICD-10-CM | POA: Insufficient documentation

## 2019-11-29 DIAGNOSIS — E039 Hypothyroidism, unspecified: Secondary | ICD-10-CM | POA: Diagnosis not present

## 2019-11-29 DIAGNOSIS — C3412 Malignant neoplasm of upper lobe, left bronchus or lung: Secondary | ICD-10-CM | POA: Insufficient documentation

## 2019-11-29 DIAGNOSIS — C9110 Chronic lymphocytic leukemia of B-cell type not having achieved remission: Secondary | ICD-10-CM | POA: Diagnosis not present

## 2019-11-29 DIAGNOSIS — J449 Chronic obstructive pulmonary disease, unspecified: Secondary | ICD-10-CM | POA: Diagnosis not present

## 2019-11-29 DIAGNOSIS — G4733 Obstructive sleep apnea (adult) (pediatric): Secondary | ICD-10-CM | POA: Insufficient documentation

## 2019-11-29 DIAGNOSIS — C3492 Malignant neoplasm of unspecified part of left bronchus or lung: Secondary | ICD-10-CM

## 2019-11-29 DIAGNOSIS — Z7189 Other specified counseling: Secondary | ICD-10-CM | POA: Diagnosis not present

## 2019-11-29 DIAGNOSIS — Z87891 Personal history of nicotine dependence: Secondary | ICD-10-CM | POA: Insufficient documentation

## 2019-11-29 DIAGNOSIS — E785 Hyperlipidemia, unspecified: Secondary | ICD-10-CM | POA: Diagnosis not present

## 2019-11-29 MED ORDER — PROCHLORPERAZINE MALEATE 10 MG PO TABS: 10.0000 mg | ORAL_TABLET | Freq: Four times a day (QID) | ORAL | 2 refills | Status: DC | PRN

## 2019-11-29 NOTE — Patient Instructions (Addendum)
-  There are two main categories of lung cancer, they are named based on the size of the cancer cell. One is called Non-Small cell lung cancer. The other type is Small Cell Lung Cancer -The sample (biopsy) that they took of your tumor was consistent with a subtype of Non-small cell lung cancer called Squamous Cell Carcinoma.  -We covered a lot of important information at your appointment today regarding what the treatment plan is moving forward. Here are the the main points that were discussed at your office visit with [place] today:  -The treatment that you will receive consists of two chemotherapy drugs called Carboplatin and Paclitaxel (also called Taxol) -We are planning on starting your treatment next week on 12/10/19 but before your start your treatment, I would like you to attend a Chemotherapy Education Class. This involves having you sit down with one of our nurse educators. She will discuss with you one-on-one more details about your treatment as well as general information about resources here at the [facility] treatment will be given every week for about 6 weeks or so (as long as you are also receiving radiation). We will check your labs (blood work) once a week . Dr. Julien Nordmann or I will see you every other treatment just to make sure that you are doing well and that everything is on track. -We will get a CT scan about 3 weeks after you complete your treatment  Medications:  -Compazine was sent to your pharmacy. This medication is for nausea. You may take this every 6 hours as needed if you feel nausous.   Referrals -I placed a referral to radiation oncology. They are in the basement of this building. They should be calling you to scheduled an appointment.   Follow Up:  -We will see you back for a follow up visit in 3 weeks

## 2019-11-29 NOTE — Progress Notes (Signed)

## 2019-11-30 ENCOUNTER — Telehealth: Payer: Self-pay | Admitting: *Deleted

## 2019-11-30 NOTE — Telephone Encounter (Signed)
Received vm call from pt asking about chemo & radiation & wondering if both will start at the same time. Called pt & assured him that schedulers will be working on his appts & get back with him & message sent to radiology to make sure they received referral.  He expressed understanding.

## 2019-12-03 ENCOUNTER — Telehealth: Payer: Self-pay | Admitting: Internal Medicine

## 2019-12-03 NOTE — Telephone Encounter (Signed)
Scheduled per los. Called and spoke with patient. Confirmed appts  

## 2019-12-03 NOTE — Progress Notes (Signed)
Radiation Oncology         [phone] ________________________________  Initial Outpatient Consultation  Name: [name] MRN: [id number]  Date: 12/04/2019  DOB: [birth year]  KG:[id number], [name], MD  Curt Bears, MD   REFERRING PHYSICIAN: Curt Bears, MD  DIAGNOSIS: The encounter diagnosis was Stage III squamous cell carcinoma of left lung ([place]).  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 male who is seen as a courtesy of Dr. Julien Nordmann for an opinion concerning radiation therapy as part of management for his recently diagnosed lung cancer. Today, he is accompanied by no one. The patient underwent a chest x-ray on 08/23/2019 for two-month history of increasing dyspnea. Results showed a left upper lobe pulmonary mass. Chest CT scan on 09/07/2019 showed a large centrally necrotic mass lesion in the left upper lobe that was consistent with primary pulmonary neoplasm. There was some associated hilar and mediastinal adenopathy in addition to some atelectasis in the medial aspect of the left upper lobe that was related to bronchial occlusion.   Given the above findings, the patient was referred to Dr. Lamonte Sakai and was seen in consultation on 10/15/2019. At that time, it was recommended that he proceed with navigational bronchoscopy and endobronchial ultrasound to obtain a tissue diagnosis.   The patient underwent a video bronchoscopy with electromagnetic navigation and endobronchial ultrasound on 10/23/2019 under the care of Dr. Lamonte Sakai. Pathology from the procedure revealed benign bronchial tissue with crush artifact. The crush cells were consistent with lymphocyte. No granulomas or malignancy was identified. Cytology from the procedure revealed rare atypical cells in the left upper lobe brushing and benign reactive/reparative changes in the left upper lobe fine needle aspiration. No carcinoma was  identified in lymph node 4L fine needle aspiration, lymph node 11R fine needle aspiration, or lymph node 7n fine needle aspiration.   The patient's case was discussed at the thoracic conference on 11/01/2019. Despite negative EBUS samples, there was still concern for stage 3 disease. Thus, it was recommended that the patient proceed with PET scan and possible repeat biopsy.  PET scan on 11/09/2019 showed a left upper lobe mass that was compatible with bronchogenic neoplasm, associated with mediastinal adenopathy. There was also a contralateral right hilar lymph node with very mild FDG uptake but with imaging features that remained suspicious for contralateral nodal involvement. Additionally, there was heterogeneous marrow uptake in general in the spine but with more focal areas at the L4 level. Finally, there was generalized mild heterogeneity elsewhere in marrow spaces that could represent a similar process but was more pronounced in terms of FDG uptake. The other small pulmonary nodules and nodular pleural based lesion were without FDG uptake.   The patient was referred to medical oncology and was seen in consultation with Dr. Julien Nordmann and [facility], PA-C, on 11/14/2019. They discussed the PET scan results and need for repeat bronchoscopy to confirm tissue diagnosis.   The patient underwent a repeat video bronchoscopy with electromagnetic navigation on 11/20/2019 under the care of Dr. Lamonte Sakai. Pathology from the procedure revealed squamous cell carcinoma of the left upper lobe. Cytology from the procedure revealed malignant cells in the left upper lobe brushing and left upper lobe fine needle aspiration. The patient did experience a tiny left apical pneumothorax superimposed on the anterior left first rib and was admitted to the hospital until 11/22/2019.  MRI of brain on 11/23/2019 showed generalized atrophy and chronic  microvascular ischemia without intracranial metastatic disease.  The  patient was last seen by [facility], PA-C, on 11/29/2019. At that time, it was recommended that he proceed with concurrent chemoradiation with Carboplatin and Paclitaxel to begin on 12/13/2019.  Of note, the patient has a history of chronic lymphocytic leukemia that was diagnosed in 06/21/14. He is being followed by Dr. Burr Medico and remains under surveillance.  PREVIOUS RADIATION THERAPY: No  PAST MEDICAL HISTORY:  Past Medical History:  Diagnosis Date  . Alcohol abuse, daily use 08/24/2010   Stopped 20-Jun-2013   . Arthralgia January 04, 2014   9/15 Not related to statins OA   . Cataract   . Chronic lymphocytic leukemia ([place]) 07-18-2014   chronic stage 1- no symtoms  . CLL (chronic lymphocytic leukemia) ([place]) 08/08/2014   06-21-14 Dr Burr Medico Stage 0  . COPD mixed type ([place]) 07/26/2007   Smoker - stopped 6/14   . De Quervain's tenosynovitis, right 01-04-14   2013/06/20   . Depression    at times  . Dysrhythmia    PAF  . Dysuria 01-04-14   9/15 - poss stricture Urol ref was offered   . Gallstones 11/16/2017   Asymptomatic Pt refused surg ref  . Generalized anxiety disorder 09/07/2012   Chronic   Potential benefits of a long term steroid  use as well as potential risks  and complications were explained to the patient and were aknowledged.     [name] GERD 12/02/2006   Chronic     . Grief June 05, 2016   [name] died in 2014-06-21  . Gynecomastia 01/04/2014   Benign B 06-20-2013   . Hyperlipidemia   . Hypertension   . Hypothyroidism 12/25/2014   June 21, 2014 On Levothyroxine   . Intertrigo 02/02/2012   11/13   . Neoplasm of uncertain behavior of skin 03/12/2013   12/14 R ear, chest   . OSA on CPAP    mild with AHI 9/hr and oxygen desats as low as 75%  . Paresis ([place])    right- s/p cerv decompression  . PERIORBITAL CELLULITIS 02/22/2009   Qualifier: Diagnosis of  By: Diona Browner MD, Amy    . Retinal detachment    L>>R    PAST SURGICAL HISTORY: Past Surgical History:  Procedure Laterality Date  . BICEPS TENDON REPAIR    .  BRONCHIAL BIOPSY  10/23/2019   Procedure: BRONCHIAL BIOPSIES;  Surgeon: [name], MD;  Location: [facility] ENDOSCOPY;  Service: Pulmonary;;  . BRONCHIAL BIOPSY  11/20/2019   Procedure: BRONCHIAL BIOPSIES;  Surgeon: [name], MD;  Location: [facility] ENDOSCOPY;  Service: Pulmonary;;  . BRONCHIAL BRUSHINGS  10/23/2019   Procedure: BRONCHIAL BRUSHINGS;  Surgeon: [name], MD;  Location[facility] ENDOSCOPY;  Service: Pulmonary;;  . BRONCHIAL BRUSHINGS  11/20/2019   Procedure: BRONCHIAL BRUSHINGS;  Surgeon: [name], MD;  Location: [facility] ENDOSCOPY;  Service: Pulmonary;;  . BRONCHIAL NEEDLE ASPIRATION BIOPSY  10/23/2019   Procedure: BRONCHIAL NEEDLE ASPIRATION BIOPSIES;  Surgeon: [name], MD;  Location: [facility] ENDOSCOPY;  Service: Pulmonary;;  . BRONCHIAL NEEDLE ASPIRATION BIOPSY  11/20/2019   Procedure: BRONCHIAL NEEDLE ASPIRATION BIOPSIES;  Surgeon: [name], MD;  Location: [place];  Service: Pulmonary;;  . BRONCHIAL WASHINGS  11/20/2019   Procedure: BRONCHIAL WASHINGS;  Surgeon: [name], MD;  Location[facility] ENDOSCOPY;  Service: Pulmonary;;  . CATARACT EXTRACTION Left   . COLONOSCOPY  04-14-99   Dr Flossie Dibble polyp-TA in epic  . POLYPECTOMY  04-14-99  . POSTERIOR LAMINECTOMY / DECOMPRESSION CERVICAL  SPINE     Dr Saintclair Halsted  . RETINAL DETACHMENT SURGERY     left eye, 2007 x2, 2008 x 3  . ROTATOR CUFF REPAIR  2004   right  . TONSILLECTOMY  [id number]  . VIDEO BRONCHOSCOPY WITH ENDOBRONCHIAL NAVIGATION N/A 10/23/2019   Procedure: VIDEO BRONCHOSCOPY WITH ENDOBRONCHIAL NAVIGATION;  Surgeon: [name], MD;  Location: [place] ENDOSCOPY;  Service: Pulmonary;  Laterality: N/A;  . VIDEO BRONCHOSCOPY WITH ENDOBRONCHIAL NAVIGATION N/A 11/20/2019   Procedure: VIDEO BRONCHOSCOPY WITH ENDOBRONCHIAL NAVIGATION;  Surgeon: [name], MD;  Location: [place] ENDOSCOPY;  Service: Pulmonary;  Laterality: N/A;  . VIDEO BRONCHOSCOPY WITH ENDOBRONCHIAL ULTRASOUND N/A 10/23/2019   Procedure: VIDEO BRONCHOSCOPY  WITH ENDOBRONCHIAL ULTRASOUND;  Surgeon: [name], MD;  Location: [place] ENDOSCOPY;  Service: Pulmonary;  Laterality: N/A;    FAMILY HISTORY:  Family History  Problem Relation Age of Onset  . COPD Mother   . Diabetes Father   . Coronary artery disease Other   . Breast cancer Paternal Aunt 32  . Colon cancer Neg Hx     SOCIAL HISTORY:  Social History   Tobacco Use  . Smoking status: Former Smoker    Packs/day: 0.80    Years: 50.00    Pack years: 40.00    Types: Cigarettes    Quit date: 08/27/2012    Years since quitting: 7.2  . Smokeless tobacco: Never Used  Vaping Use  . Vaping Use: Never used  Substance Use Topics  . Alcohol use: Not Currently  . Drug use: No    ALLERGIES: No Known Allergies  MEDICATIONS:  Current Outpatient Medications  Medication Sig Dispense Refill  . amLODipine (NORVASC) 5 MG tablet Take 1 tablet (5 mg total) by mouth daily. 90 tablet 3  . apixaban (ELIQUIS) 5 MG TABS tablet Take 1 tablet (5 mg total) by mouth 2 (two) times daily. Okay to restart this medication on 11/23/2019 60 tablet   . atenolol (TENORMIN) 25 MG tablet Take 1 tablet (25 mg total) by mouth daily as needed (for palpitations). (Patient not taking: Reported on 12/04/2019) 90 tablet 3  . Cholecalciferol (EQL VITAMIN D3) 1000 UNITS tablet Take 1,000 Units by mouth daily.      . diazepam (VALIUM) 5 MG tablet Take 1 tablet (5 mg total) by mouth daily.    [name] levothyroxine (SYNTHROID) 50 MCG tablet Take 1 tablet (50 mcg total) by mouth daily. 90 tablet 3  . lovastatin (MEVACOR) 20 MG tablet Take 1 tablet (20 mg total) by mouth at bedtime. 90 tablet 3  . Multiple Vitamin (MULTIVITAMIN) tablet Take 1 tablet by mouth daily. Centrum Silver.    Vladimir Faster Glycol-Propyl Glycol (SYSTANE) 0.4-0.3 % SOLN Place 1 drop into both eyes daily as needed (Dry eyes). Ultra    . prochlorperazine (COMPAZINE) 10 MG tablet Take 1 tablet (10 mg total) by mouth every 6 (six) hours as needed. (Patient not taking:  Reported on 12/04/2019) 30 tablet 2   No current facility-administered medications for this encounter.    REVIEW OF SYSTEMS:  A 10+ POINT REVIEW OF SYSTEMS WAS OBTAINED including neurology, dermatology, psychiatry, cardiac, respiratory, lymph, extremities, GI, GU, musculoskeletal, constitutional, reproductive, HEENT.  He denies any pain within the chest area significant cough or hemoptysis.  He denies any new bony pain.   PHYSICAL EXAM:  height is 5\' 11"  (1.803 m) and weight is 189 lb (85.7 kg). His temperature is 98.2 F (36.8 C). His blood pressure is 133/74 and his pulse is 73.  His respiration is 20 and oxygen saturation is 94%.   General: Alert and oriented, in no acute distress HEENT: Head is normocephalic. Extraocular movements are intact. Oropharynx is clear.  Dentures in place no secondary infection Neck: Neck is supple, no palpable cervical or supraclavicular lymphadenopathy. Heart: Regular in rate and rhythm with no murmurs, rubs, or gallops. Chest: Clear to auscultation bilaterally, with no rhonchi, wheezes, or rales. Abdomen: Soft, nontender, nondistended, with no rigidity or guarding. Extremities: No cyanosis or edema. Lymphatics: see Neck Exam Skin: No concerning lesions. Musculoskeletal: symmetric strength and muscle tone throughout. Neurologic: Cranial nerves II through XII are grossly intact. No obvious focalities. Speech is fluent. Coordination is intact. Psychiatric: Judgment and insight are intact. Affect is appropriate.   ECOG = 1  0 - Asymptomatic (Fully active, able to carry on all predisease activities without restriction)  1 - Symptomatic but completely ambulatory (Restricted in physically strenuous activity but ambulatory and able to carry out work of a light or sedentary nature. For example, light housework, office work)  2 - Symptomatic, <50% in bed during the day (Ambulatory and capable of all self care but unable to carry out any work activities. Up and about  more than 50% of waking hours)  3 - Symptomatic, >50% in bed, but not bedbound (Capable of only limited self-care, confined to bed or chair 50% or more of waking hours)  4 - Bedbound (Completely disabled. Cannot carry on any self-care. Totally confined to bed or chair)  5 - Death   Eustace Pen MM, Creech RH, Tormey DC, et al. [phone]). "Toxicity and response criteria of the [facility]". [place] Oncol. 5 (6): 649-55  LABORATORY DATA:  Lab Results  Component Value Date   WBC 18.3 (H) 11/21/2019   HGB 14.2 11/21/2019   HCT 44.8 11/21/2019   MCV 92.8 11/21/2019   PLT 354 11/21/2019   NEUTROABS 7.6 11/14/2019   Lab Results  Component Value Date   NA 138 11/21/2019   K 4.5 11/21/2019   CL 102 11/21/2019   CO2 25 11/21/2019   GLUCOSE 123 (H) 11/21/2019   CREATININE 0.74 11/21/2019   CALCIUM 8.9 11/21/2019      RADIOGRAPHY: DG Chest 2 View  Result Date: 11/27/2019 CLINICAL DATA:  Postprocedural pneumothorax. Pneumothorax status post bronchoscopy. EXAM: CHEST - 2 VIEW COMPARISON:  Radiograph 11/22/2019, PET CT 11/09/2019 FINDINGS: Stable size and appearance of the left apical pneumothorax from prior exam, small in size. This is visualized under the left posterior third rib is well as laterally. No interval change from prior exam. Left upper lobe mass again seen. Persistent but diminished blunting of the left costophrenic angle, possible small effusion. Background emphysema and chronic hyperinflation. No other interval change. IMPRESSION: 1. Stable size and appearance of left apical pneumothorax from prior exam, small in size. 2. Left upper lobe mass again seen. Decreased blunting of the left costophrenic angle, suggesting persistent but improving small pleural effusion. Electronically Signed   By: Keith Rake M.D.   On: 11/27/2019 15:38   MR Brain W Wo Contrast  Result Date: 11/24/2019 CLINICAL DATA:  Staging of non-small cell lung carcinoma EXAM: MRI HEAD WITHOUT  AND WITH CONTRAST TECHNIQUE: Multiplanar, multiecho pulse sequences of the brain and surrounding structures were obtained without and with intravenous contrast. CONTRAST:  59mL GADAVIST GADOBUTROL 1 MMOL/ML IV SOLN COMPARISON:  None. FINDINGS: Brain: No acute infarct, acute hemorrhage or extra-axial collection. Multifocal hyperintense T2-weighted signal within the white matter. There is generalized  atrophy without lobar predilection. Chronic microhemorrhage in the right basal ganglia. Normal midline structures. Vascular: Normal flow voids. Skull and upper cervical spine: Normal marrow signal. Sinuses/Orbits: Bilateral ocular lens replacements. Left scleral banding. Paranasal sinuses are clear. Other: None IMPRESSION: 1. No intracranial metastatic disease. 2. Generalized atrophy and chronic microvascular ischemia. Electronically Signed   By: Ulyses Jarred M.D.   On: 11/24/2019 03:43   NM PET Image Initial (PI) Skull Base To Thigh  Result Date: 11/10/2019 CLINICAL DATA:  Initial treatment strategy for lung mass. EXAM: NUCLEAR MEDICINE PET SKULL BASE TO THIGH TECHNIQUE: 9.7 mCi F-18 FDG was injected intravenously. Full-ring PET imaging was performed from the skull base to thigh after the radiotracer. CT data was obtained and used for attenuation correction and anatomic localization. Fasting blood glucose: 86 mg/dl COMPARISON:  September 07, 2019 FINDINGS: Mediastinal blood pool activity: SUV max 3.24 Liver activity: SUV max NA NECK: No hypermetabolic lymph nodes in the neck. Incidental CT findings: None CHEST: Large LEFT upper lobe mass abutting the mediastinal border measuring approximately 7.2 x 4.7 cm, mass with similar appearance when compared to the previous imaging evaluation. (SUVmax = 20) AP window lymph node on the LEFT 1.4 cm (image 72, series 4) (SUVmax = 4.1) other small AP window lymph nodes showing slightly less FDG uptake on the same image., 3 additional lymph nodes in this area this is the dominant node  in this location. Small lymph node between the pulmonary artery and the aorta (image 75, series 4) 7 mm (SUVmax = 4.4) RIGHT hilar lymph node 12 mm short axis (image 81, series 4) (SUVmax = 3.0) Small LEFT axillary lymph node nonspecific following recent COVID-19 vaccination displaced fatty hilum. Small nodule along the fissure in the RIGHT chest, along the minor fissure (image 35, series 8) 5 mm without FDG uptake beyond mediastinal blood pool Mildly nodular focus along the pleural surface in the RIGHT chest 7 mm (image 19, series 8) also without significant FDG uptake. (Image 74, series 4) 5 mm LEFT lower lobe pulmonary nodule along the pleural surface without significant FDG uptake. Incidental CT findings: Calcified atheromatous plaque of the thoracic aorta. Calcified coronary artery disease. No pericardial effusion. Esophagus grossly normal. Pulmonary emphysema. ABDOMEN/PELVIS: No abnormal hypermetabolic activity within the liver, pancreas, adrenal glands, or spleen. No hypermetabolic lymph nodes in the abdomen or pelvis. Incidental CT findings: Moderate to marked hepatic steatosis. Cholelithiasis without pericholecystic stranding. Pancreas normal contour. Spleen normal in size and contour. Adrenal glands are normal. No hydronephrosis. No acute bowel process. Normal appendix. Calcified atheromatous plaque of the abdominal aorta without aneurysmal dilation. No adenopathy. No pelvic adenopathy by size criteria. SKELETON: At the L4 vertebral level there are foci of increased metabolic activity (maximum SUV 4.6) not corresponding well with any signs of degenerative change at this location and without visible lesion. There is scattered other areas of mild heterogeneity that remain at or near blood pool throughout the spine favored to represent mild heterogeneous marrow activity. Incidental CT findings: Posterior cervical spinal fusion and laminectomy in the mid cervical spine. Spinal degenerative changes. IMPRESSION:  1. LEFT upper lobe mass compatible with bronchogenic neoplasm, associated with mediastinal adenopathy. 2. Contralateral RIGHT hilar lymph node with very mild FDG uptake but with imaging features that remains suspicious for contralateral nodal involvement. 3. Heterogeneous marrow uptake in general in the spine but with more focal areas at the L4 level as described. No imaging correlate on CT. Generalized mild heterogeneity elsewhere in marrow spaces, this could represent  similar process but is more pronounced in terms of its FDG uptake. Lumbar spine MRI may be helpful for further assessment as clinically warranted. 4. Other small pulmonary nodules and nodular pleural based lesion as described without FDG uptake 5. Marked hepatic steatosis. 6. Cholelithiasis. 7. Atherosclerosis and pulmonary emphysema. Aortic Atherosclerosis (ICD10-I70.0) and Emphysema (ICD10-J43.9). Electronically Signed   By: Zetta Bills M.D.   On: 11/10/2019 13:19   DG Chest Port 1 View  Result Date: 11/22/2019 CLINICAL DATA:  Pneumothorax EXAM: PORTABLE CHEST 1 VIEW COMPARISON:  11/21/2019 and multiple prior studies FINDINGS: Trachea midline. Cardiomediastinal contours are stable. LEFT suprahilar and upper lobe mass is redemonstrated with persistent LEFT apical and lateral pneumothorax, unchanged with respect to size on frontal view. Blunting of LEFT costodiaphragmatic sulcus may reflect small effusion. There is a small sub pulmonic component to the pneumothorax previously, this is no longer evident. Emphysematous changes. RIGHT chest is clear. Visualized skeletal structures without acute process. IMPRESSION: 1. Unchanged LEFT apical and lateral pneumothorax. 2. Blunting of LEFT costodiaphragmatic sulcus may reflect small amount of pleural fluid in the LEFT chest. 3. LEFT suprahilar and upper lobe mass again noted. Electronically Signed   By: Zetta Bills M.D.   On: 11/22/2019 08:28   DG Chest Port 1 View  Result Date:  11/21/2019 CLINICAL DATA:  Pneumothorax after biopsy EXAM: PORTABLE CHEST 1 VIEW COMPARISON:  Earlier same day FINDINGS: Left apical pneumothorax is similar in size. Left upper lobe mass again noted. Emphysema. No new findings. Stable cardiomediastinal contours. IMPRESSION: Stable small left apical pneumothorax. Electronically Signed   By: Macy Mis M.D.   On: 11/21/2019 14:28   DG CHEST PORT 1 VIEW  Result Date: 11/21/2019 CLINICAL DATA:  Left pneumothorax. EXAM: PORTABLE CHEST 1 VIEW COMPARISON:  November 20, 2019. FINDINGS: The heart size and mediastinal contours are within normal limits. Right lung is clear. Stable mild left apical pneumothorax is noted. Left upper lobe pulmonary mass is again noted. No significant pleural effusion is noted. The visualized skeletal structures are unremarkable. IMPRESSION: Stable mild left apical pneumothorax. Left upper lobe pulmonary mass is again noted. Aortic Atherosclerosis (ICD10-I70.0). Electronically Signed   By: Marijo Conception M.D.   On: 11/21/2019 08:07   DG CHEST PORT 1 VIEW  Result Date: 11/20/2019 CLINICAL DATA:  Pneumothorax EXAM: PORTABLE CHEST 1 VIEW COMPARISON:  11:55 a.m. FINDINGS: The lungs are mildly hyperinflated in keeping with changes of underlying COPD. Small left apical pneumothorax is again identified has slightly increased in size since prior examination. No mediastinal shift or hyperexpansion of the left hemithorax to suggest tension physiology. Left upper lobe pulmonary mass is again noted. No pleural effusion. Cardiac size within normal limits. Pulmonary vascularity is normal. IMPRESSION: 1. Slight interval increase in size of small left apical pneumothorax. 2. Left upper lobe pulmonary mass again noted. 3. COPD. Electronically Signed   By: Fidela Salisbury MD   On: 11/20/2019 19:29   DG Chest Port 1 View  Result Date: 11/20/2019 CLINICAL DATA:  Status post bronchoscopy. EXAM: PORTABLE CHEST 1 VIEW COMPARISON:  10/23/2019 FINDINGS: Left  upper lobe pulmonary mass again noted. Tiny left apical pneumothorax superimposed on the anterior left first rib. No pleural effusion. Interstitial markings are diffusely coarsened with chronic features. The cardiopericardial silhouette is within normal limits for size. The visualized bony structures of the thorax show now acute abnormality. Telemetry leads overlie the chest. IMPRESSION: 1. Tiny left apical pneumothorax superimposed on the anterior left first rib. 2. Left upper lobe  pulmonary mass. These results will be called to the ordering clinician or representative by the Radiologist Assistant, and communication documented in the PACS or [employer]. Electronically Signed   By: Misty Stanley M.D.   On: 11/20/2019 11:42   DG C-ARM BRONCHOSCOPY  Result Date: 11/20/2019 C-ARM BRONCHOSCOPY: Fluoroscopy was utilized by the requesting physician.  No radiographic interpretation.      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  Today, I talked to the patient  about the findings and work-up thus far.  We discussed the natural history of non-small cell squamous cell carcinoma and general treatment, highlighting the role of radiotherapy in the management.  We discussed the available radiation techniques, and focused on the details of logistics and delivery.  We reviewed the anticipated acute and late sequelae associated with radiation in this setting.  The patient was encouraged to ask questions that I answered to the best of my ability.  A patient consent form was discussed and signed.  We retained a copy for our records.  The patient would like to proceed with radiation and will be scheduled for CT simulation.  PLAN: The patient will return tomorrow for CT simulation with treatments to begin next week concomitant with his radiosensitizing  chemotherapy.  Anticipate 6 weeks of radiation therapy.  Total time spent in this encounter was 65 minutes which included reviewing the patient's most recent chest x-rays, chest CT scan, PET scan, brain MRI, consultations, follow-ups, bronchoscopies, pathology/cytology reports, physical examination, and documentation.   ------------------------------------------------  Blair Promise, PhD, MD  This document serves as a record of services personally performed by Gery Pray, MD. It was created on his behalf by Clerance Lav, a trained medical scribe. The creation of this record is based on the scribe's personal observations and the provider's statements to them. This document has been checked and approved by the attending provider.

## 2019-12-04 ENCOUNTER — Other Ambulatory Visit: Payer: Self-pay

## 2019-12-04 ENCOUNTER — Ambulatory Visit
Admission: RE | Admit: 2019-12-04 | Discharge: 2019-12-04 | Disposition: A | Discharge status: Home / Self Care | Payer: Medicare Other | Source: Ambulatory Visit | Attending: Radiation Oncology | Admitting: Radiation Oncology

## 2019-12-04 VITALS — BP 133/74 | HR 73 | Temp 98.2°F | Resp 20 | Ht 71.0 in | Wt 189.0 lb

## 2019-12-04 DIAGNOSIS — F1011 Alcohol abuse, in remission: Secondary | ICD-10-CM | POA: Insufficient documentation

## 2019-12-04 DIAGNOSIS — E785 Hyperlipidemia, unspecified: Secondary | ICD-10-CM | POA: Diagnosis not present

## 2019-12-04 DIAGNOSIS — Z7982 Long term (current) use of aspirin: Secondary | ICD-10-CM | POA: Diagnosis not present

## 2019-12-04 DIAGNOSIS — I10 Essential (primary) hypertension: Secondary | ICD-10-CM | POA: Insufficient documentation

## 2019-12-04 DIAGNOSIS — C3492 Malignant neoplasm of unspecified part of left bronchus or lung: Secondary | ICD-10-CM

## 2019-12-04 DIAGNOSIS — J449 Chronic obstructive pulmonary disease, unspecified: Secondary | ICD-10-CM | POA: Diagnosis not present

## 2019-12-04 DIAGNOSIS — K760 Fatty (change of) liver, not elsewhere classified: Secondary | ICD-10-CM | POA: Insufficient documentation

## 2019-12-04 DIAGNOSIS — I2510 Atherosclerotic heart disease of native coronary artery without angina pectoris: Secondary | ICD-10-CM | POA: Insufficient documentation

## 2019-12-04 DIAGNOSIS — Z79899 Other long term (current) drug therapy: Secondary | ICD-10-CM | POA: Insufficient documentation

## 2019-12-04 DIAGNOSIS — Z87891 Personal history of nicotine dependence: Secondary | ICD-10-CM | POA: Insufficient documentation

## 2019-12-04 DIAGNOSIS — K219 Gastro-esophageal reflux disease without esophagitis: Secondary | ICD-10-CM | POA: Insufficient documentation

## 2019-12-04 DIAGNOSIS — E039 Hypothyroidism, unspecified: Secondary | ICD-10-CM | POA: Insufficient documentation

## 2019-12-04 DIAGNOSIS — C3412 Malignant neoplasm of upper lobe, left bronchus or lung: Secondary | ICD-10-CM | POA: Insufficient documentation

## 2019-12-04 DIAGNOSIS — I480 Paroxysmal atrial fibrillation: Secondary | ICD-10-CM | POA: Diagnosis not present

## 2019-12-04 DIAGNOSIS — E119 Type 2 diabetes mellitus without complications: Secondary | ICD-10-CM | POA: Insufficient documentation

## 2019-12-04 DIAGNOSIS — Z7901 Long term (current) use of anticoagulants: Secondary | ICD-10-CM | POA: Insufficient documentation

## 2019-12-04 DIAGNOSIS — Z803 Family history of malignant neoplasm of breast: Secondary | ICD-10-CM | POA: Diagnosis not present

## 2019-12-04 DIAGNOSIS — J90 Pleural effusion, not elsewhere classified: Secondary | ICD-10-CM | POA: Diagnosis not present

## 2019-12-04 DIAGNOSIS — Z856 Personal history of leukemia: Secondary | ICD-10-CM | POA: Diagnosis not present

## 2019-12-04 NOTE — Progress Notes (Signed)
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  Patient concerns started 2 months prior to chest xray ordered by [employer] at [facility].  Patient had increased dyspnea for 2 months.  Ex Smoker.   Biopsies of upper lobe of left lung (if applicable) revealed:     Tobacco/Marijuana/Snuff/ETOH use: Former cigarette smoker 40 years in total.  Past/Anticipated interventions by cardiothoracic surgery, if any: Bronchial Biopsy Dr. Lamonte Sakai 10/23/2019 & 11/20/2019  Past/Anticipated interventions by medical oncology, if any: Concurrent chemoradiation with carboplatin for an AUC of 2 and paclitaxel 45 mg per metered squared weekly.  First dose expected on 12/10/2019.  Signs/Symptoms  Weight changes, if any: some weight loss, reports he decreased appetite with stress  Respiratory complaints, if any: sob with exertion, uses CPAP machine nightly with recent diagnosis of OSA  Hemoptysis, if any: none  Pain issues, if any: none  SAFETY ISSUES:  Prior radiation? 1950's he had tonsillectomy and they did not get all of the tonsil on the initial surgery so they did Cobalt Therapy to the tonsil  Pacemaker/ICD?  no  Possible current pregnancy? no  Is the patient on methotrexate? no  Current Complaints / other details: Widowed.

## 2019-12-04 NOTE — Progress Notes (Signed)
Nurse evaluation completed for new consult

## 2019-12-05 ENCOUNTER — Other Ambulatory Visit: Payer: Self-pay

## 2019-12-05 ENCOUNTER — Ambulatory Visit: Payer: Medicare Other | Admitting: Emergency Medicine

## 2019-12-05 ENCOUNTER — Ambulatory Visit
Admission: RE | Admit: 2019-12-05 | Discharge: 2019-12-05 | Disposition: A | Discharge status: Home / Self Care | Payer: Medicare Other | Source: Ambulatory Visit | Attending: Radiation Oncology | Admitting: Radiation Oncology

## 2019-12-05 DIAGNOSIS — C3492 Malignant neoplasm of unspecified part of left bronchus or lung: Secondary | ICD-10-CM

## 2019-12-05 DIAGNOSIS — C3412 Malignant neoplasm of upper lobe, left bronchus or lung: Secondary | ICD-10-CM | POA: Diagnosis not present

## 2019-12-05 DIAGNOSIS — Z510 Encounter for antineoplastic radiation therapy: Secondary | ICD-10-CM | POA: Insufficient documentation

## 2019-12-05 NOTE — Addendum Note (Signed)
Encounter addended by: Heywood Footman, RN on: 12/05/2019 3:08 PM  Actions taken: Charge Capture section accepted

## 2019-12-05 NOTE — Progress Notes (Signed)
Pharmacist Chemotherapy Monitoring - Initial Assessment    Anticipated start date: 12/10/2019   Regimen:  . Are orders appropriate based on the patient's diagnosis, regimen, and cycle? Yes . Does the plan date match the patient's scheduled date? Yes . Is the sequencing of drugs appropriate? Yes . Are the premedications appropriate for the patient's regimen? Yes . Prior Authorization for treatment is: Approved o If applicable, is the correct biosimilar selected based on the patient's insurance? no  Organ Function and Labs: [name] Are dose adjustments needed based on the patient's renal function, hepatic function, or hematologic function? Yes . Are appropriate labs ordered prior to the start of patient's treatment? Yes . Other organ system assessment, if indicated: N/A . The following baseline labs, if indicated, have been ordered: N/A  Dose Assessment: . Are the drug doses appropriate? Yes . Are the following correct: o Drug concentrations Yes o IV fluid compatible with drug Yes o Administration routes Yes o Timing of therapy Yes . If applicable, does the patient have documented access for treatment and/or plans for port-a-cath placement? no . If applicable, have lifetime cumulative doses been properly documented and assessed? yes Lifetime Dose Tracking  No doses have been documented on this patient for the following tracked chemicals: Doxorubicin, Epirubicin, Idarubicin, Daunorubicin, Mitoxantrone, Bleomycin, Oxaliplatin, Carboplatin, Liposomal Doxorubicin  o   Toxicity Monitoring/Prevention: . The patient has the following take home antiemetics prescribed: Ondansetron and Prochlorperazine . The patient has the following take home medications prescribed: N/A . Medication allergies and previous infusion related reactions, if applicable, have been reviewed and addressed. No . The patient's current medication list has been assessed for drug-drug interactions with their chemotherapy regimen.  no significant drug-drug interactions were identified on review.  Order Review: . Are the treatment plan orders signed? Yes . Is the patient scheduled to see a provider prior to their treatment? No  I verify that I have reviewed each item in the above checklist and answered each question accordingly.  Lanna Labella D 12/05/2019 3:44 PM

## 2019-12-06 ENCOUNTER — Encounter: Payer: Self-pay | Admitting: Physician Assistant

## 2019-12-06 ENCOUNTER — Inpatient Hospital Stay: Payer: Medicare Other

## 2019-12-06 ENCOUNTER — Other Ambulatory Visit: Payer: Self-pay

## 2019-12-06 ENCOUNTER — Encounter (HOSPITAL_COMMUNITY): Payer: Self-pay

## 2019-12-06 NOTE — Progress Notes (Signed)
Met with patient at registration to introduce myself as Financial Resource Specialist and to offer available resources.  Discussed one-time $1000 [employer] grant and qualifications to assist with personal expenses while going through treatment.  Patient states he has 2 insurances and is financially stable and declined the grant.  Gave him my card if interested and for any additional financial questions or concerns.  

## 2019-12-10 ENCOUNTER — Inpatient Hospital Stay: Payer: Medicare Other

## 2019-12-10 ENCOUNTER — Other Ambulatory Visit: Payer: Self-pay

## 2019-12-10 VITALS — BP 100/78 | HR 52 | Temp 98.2°F | Resp 16

## 2019-12-10 DIAGNOSIS — I10 Essential (primary) hypertension: Secondary | ICD-10-CM | POA: Diagnosis not present

## 2019-12-10 DIAGNOSIS — Z5111 Encounter for antineoplastic chemotherapy: Secondary | ICD-10-CM | POA: Diagnosis not present

## 2019-12-10 DIAGNOSIS — C3492 Malignant neoplasm of unspecified part of left bronchus or lung: Secondary | ICD-10-CM

## 2019-12-10 DIAGNOSIS — C3412 Malignant neoplasm of upper lobe, left bronchus or lung: Secondary | ICD-10-CM | POA: Diagnosis not present

## 2019-12-10 DIAGNOSIS — Z87891 Personal history of nicotine dependence: Secondary | ICD-10-CM | POA: Diagnosis not present

## 2019-12-10 DIAGNOSIS — C9110 Chronic lymphocytic leukemia of B-cell type not having achieved remission: Secondary | ICD-10-CM | POA: Diagnosis not present

## 2019-12-10 DIAGNOSIS — I480 Paroxysmal atrial fibrillation: Secondary | ICD-10-CM | POA: Diagnosis not present

## 2019-12-10 LAB — CBC WITH DIFFERENTIAL (CANCER CENTER ONLY)
Abs Immature Granulocytes: 0.04 10*3/uL (ref 0.00–0.07)
Basophils Absolute: 0.1 10*3/uL (ref 0.0–0.1)
Basophils Relative: 0 %
Eosinophils Absolute: 0.3 10*3/uL (ref 0.0–0.5)
Eosinophils Relative: 2 %
HCT: 49.5 % (ref 39.0–52.0)
Hemoglobin: 16 g/dL (ref 13.0–17.0)
Immature Granulocytes: 0 %
Lymphocytes Relative: 34 %
Lymphs Abs: 5.4 10*3/uL — ABNORMAL HIGH (ref 0.7–4.0)
MCH: 29 pg (ref 26.0–34.0)
MCHC: 32.3 g/dL (ref 30.0–36.0)
MCV: 89.7 fL (ref 80.0–100.0)
Monocytes Absolute: 1.3 10*3/uL — ABNORMAL HIGH (ref 0.1–1.0)
Monocytes Relative: 8 %
Neutro Abs: 8.8 10*3/uL — ABNORMAL HIGH (ref 1.7–7.7)
Neutrophils Relative %: 56 %
Platelet Count: 352 10*3/uL (ref 150–400)
RBC: 5.52 MIL/uL (ref 4.22–5.81)
RDW: 13.6 % (ref 11.5–15.5)
WBC Count: 15.8 10*3/uL — ABNORMAL HIGH (ref 4.0–10.5)
nRBC: 0 % (ref 0.0–0.2)

## 2019-12-10 LAB — CMP (CANCER CENTER ONLY)
ALT: 18 U/L (ref 0–44)
AST: 20 U/L (ref 15–41)
Albumin: 3.7 g/dL (ref 3.5–5.0)
Alkaline Phosphatase: 90 U/L (ref 38–126)
Anion gap: 6 (ref 5–15)
BUN: 7 mg/dL — ABNORMAL LOW (ref 8–23)
CO2: 29 mmol/L (ref 22–32)
Calcium: 10.3 mg/dL (ref 8.9–10.3)
Chloride: 103 mmol/L (ref 98–111)
Creatinine: 0.77 mg/dL (ref 0.61–1.24)
GFR, Est AFR Am: >60 mL/min (ref >60)
GFR, Estimated: >60 mL/min (ref >60)
Glucose, Bld: 105 mg/dL — ABNORMAL HIGH (ref 70–99)
Potassium: 4.2 mmol/L (ref 3.5–5.1)
Sodium: 138 mmol/L (ref 135–145)
Total Bilirubin: 1.7 mg/dL — ABNORMAL HIGH (ref 0.3–1.2)
Total Protein: 8.1 g/dL (ref 6.5–8.1)

## 2019-12-10 MED ORDER — SODIUM CHLORIDE 0.9 % IV SOLN
20.0000 mg | Freq: Once | INTRAVENOUS | Status: AC
  Administered 2019-12-10: 20 mg via INTRAVENOUS
  Filled 2019-12-10: qty 20

## 2019-12-10 MED ORDER — PALONOSETRON HCL INJECTION 0.25 MG/5ML
INTRAVENOUS | Status: AC
  Filled 2019-12-10: qty 5

## 2019-12-10 MED ORDER — FAMOTIDINE IN NACL 20-0.9 MG/50ML-% IV SOLN
INTRAVENOUS | Status: AC
  Filled 2019-12-10: qty 50

## 2019-12-10 MED ORDER — FAMOTIDINE IN NACL 20-0.9 MG/50ML-% IV SOLN
20.0000 mg | Freq: Once | INTRAVENOUS | Status: AC
  Administered 2019-12-10: 20 mg via INTRAVENOUS

## 2019-12-10 MED ORDER — PALONOSETRON HCL INJECTION 0.25 MG/5ML
0.2500 mg | Freq: Once | INTRAVENOUS | Status: AC
  Administered 2019-12-10: 0.25 mg via INTRAVENOUS

## 2019-12-10 MED ORDER — SODIUM CHLORIDE 0.9 % IV SOLN
212.0000 mg | Freq: Once | INTRAVENOUS | Status: AC
  Administered 2019-12-10: 210 mg via INTRAVENOUS
  Filled 2019-12-10: qty 21

## 2019-12-10 MED ORDER — DIPHENHYDRAMINE HCL 50 MG/ML IJ SOLN
INTRAMUSCULAR | Status: AC
  Filled 2019-12-10: qty 1

## 2019-12-10 MED ORDER — SODIUM CHLORIDE 0.9 % IV SOLN
Freq: Once | INTRAVENOUS | Status: AC
  Filled 2019-12-10: qty 250

## 2019-12-10 MED ORDER — SODIUM CHLORIDE 0.9 % IV SOLN
45.0000 mg/m2 | Freq: Once | INTRAVENOUS | Status: AC
  Administered 2019-12-10: 96 mg via INTRAVENOUS
  Filled 2019-12-10: qty 16

## 2019-12-10 MED ORDER — DIPHENHYDRAMINE HCL 50 MG/ML IJ SOLN
50.0000 mg | Freq: Once | INTRAMUSCULAR | Status: AC
  Administered 2019-12-10: 50 mg via INTRAVENOUS

## 2019-12-10 NOTE — Patient Instructions (Signed)
[facility] Discharge Instructions for Patients Receiving Chemotherapy  Today you received the following chemotherapy agents carboplatin, Paclitaxel   To help prevent nausea and vomiting after your treatment, we encourage you to take your nausea medication as directed by your doctor    If you develop nausea and vomiting that is not controlled by your nausea medication, call the clinic.   BELOW ARE SYMPTOMS THAT SHOULD BE REPORTED IMMEDIATELY:  *FEVER GREATER THAN 100.5 F  *CHILLS WITH OR WITHOUT FEVER  NAUSEA AND VOMITING THAT IS NOT CONTROLLED WITH YOUR NAUSEA MEDICATION  *UNUSUAL SHORTNESS OF BREATH  *UNUSUAL BRUISING OR BLEEDING  TENDERNESS IN MOUTH AND THROAT WITH OR WITHOUT PRESENCE OF ULCERS  *URINARY PROBLEMS  *BOWEL PROBLEMS  UNUSUAL RASH Items with * indicate a potential emergency and should be followed up as soon as possible.  Feel free to call the clinic should you have any questions or concerns. The clinic phone number is [phone].  Please show the [place] at check-in to the Emergency Department and triage nurse.

## 2019-12-11 ENCOUNTER — Ambulatory Visit
Admission: RE | Admit: 2019-12-11 | Discharge: 2019-12-11 | Disposition: A | Discharge status: Home / Self Care | Payer: Medicare Other | Source: Ambulatory Visit | Attending: Radiation Oncology | Admitting: Radiation Oncology

## 2019-12-11 ENCOUNTER — Other Ambulatory Visit: Payer: Self-pay

## 2019-12-11 DIAGNOSIS — C3412 Malignant neoplasm of upper lobe, left bronchus or lung: Secondary | ICD-10-CM | POA: Diagnosis not present

## 2019-12-11 DIAGNOSIS — Z510 Encounter for antineoplastic radiation therapy: Secondary | ICD-10-CM | POA: Diagnosis not present

## 2019-12-11 DIAGNOSIS — C3492 Malignant neoplasm of unspecified part of left bronchus or lung: Secondary | ICD-10-CM

## 2019-12-12 ENCOUNTER — Ambulatory Visit
Admission: RE | Admit: 2019-12-12 | Discharge: 2019-12-12 | Disposition: A | Discharge status: Home / Self Care | Payer: Medicare Other | Source: Ambulatory Visit | Attending: Radiation Oncology | Admitting: Radiation Oncology

## 2019-12-12 DIAGNOSIS — C3492 Malignant neoplasm of unspecified part of left bronchus or lung: Secondary | ICD-10-CM

## 2019-12-12 DIAGNOSIS — Z510 Encounter for antineoplastic radiation therapy: Secondary | ICD-10-CM | POA: Diagnosis not present

## 2019-12-12 DIAGNOSIS — C3412 Malignant neoplasm of upper lobe, left bronchus or lung: Secondary | ICD-10-CM | POA: Diagnosis not present

## 2019-12-13 ENCOUNTER — Ambulatory Visit
Admission: RE | Admit: 2019-12-13 | Discharge: 2019-12-13 | Disposition: A | Discharge status: Home / Self Care | Payer: Medicare Other | Source: Ambulatory Visit | Attending: Radiation Oncology | Admitting: Radiation Oncology

## 2019-12-13 ENCOUNTER — Other Ambulatory Visit: Payer: Self-pay

## 2019-12-13 DIAGNOSIS — Z510 Encounter for antineoplastic radiation therapy: Secondary | ICD-10-CM | POA: Diagnosis not present

## 2019-12-13 DIAGNOSIS — C3412 Malignant neoplasm of upper lobe, left bronchus or lung: Secondary | ICD-10-CM | POA: Diagnosis not present

## 2019-12-13 NOTE — Addendum Note (Signed)
Encounter addended by: De Burrs, RN on: 12/13/2019 8:29 AM  Actions taken: Patient Education assessment deleted, Patient Education assessment restored, Patient Education assessment filed

## 2019-12-14 ENCOUNTER — Other Ambulatory Visit: Payer: Self-pay

## 2019-12-14 ENCOUNTER — Ambulatory Visit
Admission: RE | Admit: 2019-12-14 | Discharge: 2019-12-14 | Disposition: A | Discharge status: Home / Self Care | Payer: Medicare Other | Source: Ambulatory Visit | Attending: Radiation Oncology | Admitting: Radiation Oncology

## 2019-12-14 DIAGNOSIS — Z510 Encounter for antineoplastic radiation therapy: Secondary | ICD-10-CM | POA: Diagnosis not present

## 2019-12-14 DIAGNOSIS — C3412 Malignant neoplasm of upper lobe, left bronchus or lung: Secondary | ICD-10-CM | POA: Diagnosis not present

## 2019-12-14 NOTE — Progress Notes (Signed)
[facility] OFFICE PROGRESS NOTE  [name], [name], MD [place] [id number]  DIAGNOSIS:  1) Stage IIIb non-small cell lung cancer, squamous cell carcinoma.  The patient presented with a left upper lobe lung mass as well as associated mediastinal lymphadenopathy and contralateral right hilar mild hypermetabolism.  There was heterogeneous marrow uptake in the spine but with more focal areas at L4 without imaging correlation on CT scan.  The patient was diagnosed in August 2021. 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  First dose expected on 12/13/2019.  INTERVAL HISTORY: Joe [name] 74 y.o. male returns to clinic today for a follow-up visit.  The patient is feeling fair today without any concerning complaints. The patient had his first cycle of chemotherapy last week and tolerated it well without any concerning adverse side effects. He just notes he felt tired upon returning home and mildly wobbly. He uses a cane for assistance. He notes he is not planning on taking his diazepam going forward the evening after chemo.  Today he denies any recent fever, chills, night sweats, or weight loss.  He denies any chest pain, shortness of breath, cough, or hemoptysis.  He denies any nausea, vomiting, or diarrhea. He still is having daily bowel movements but he notes that the timing has changed. He is trying to increase his intake of fiber or oatmeal. He denies any headache or visual changes.  The patient's last day of radiation is scheduled for 01/21/2020.  He is here today for evaluation before starting cycle #2 of treatment.   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  MEDICATIONS:  Current Outpatient Medications  Medication Sig Dispense Refill   amLODipine (NORVASC) 5 MG tablet Take 1 tablet (5 mg total) by mouth daily. 90 tablet 3   apixaban (ELIQUIS) 5 MG TABS tablet Take 1 tablet (5 mg total) by mouth 2 (two) times daily. Okay to restart this medication on 11/23/2019 60 tablet    atenolol (TENORMIN) 25 MG tablet Take 1 tablet (25 mg total) by mouth daily as needed (for palpitations). 90 tablet 3   Cholecalciferol (EQL VITAMIN D3) 1000 UNITS tablet Take 1,000 Units by mouth daily.       diazepam (VALIUM) 5 MG tablet Take 1 tablet (5 mg total) by mouth daily.     levothyroxine (SYNTHROID) 50 MCG tablet  Take 1 tablet (50 mcg total) by mouth daily. 90 tablet 3   lovastatin (MEVACOR) 20 MG tablet Take 1 tablet (20 mg total) by mouth at bedtime. 90 tablet 3   Multiple Vitamin (MULTIVITAMIN) tablet Take 1 tablet by mouth daily. Centrum Silver.     Polyethyl Glycol-Propyl Glycol (SYSTANE) 0.4-0.3 % SOLN Place 1 drop into both eyes daily as needed (Dry eyes). Ultra     prochlorperazine (COMPAZINE) 10 MG tablet Take 1 tablet (10 mg total) by mouth every 6 (six) hours as needed. 30 tablet 2   No current facility-administered medications for this visit.    SURGICAL HISTORY:  Past Surgical History:  Procedure Laterality Date   BICEPS TENDON REPAIR     BRONCHIAL BIOPSY  10/23/2019   Procedure: BRONCHIAL BIOPSIES;  Surgeon: [name], MD;  Location: [facility] ENDOSCOPY;  Service: Pulmonary;;   BRONCHIAL BIOPSY  11/20/2019   Procedure: BRONCHIAL BIOPSIES;  Surgeon: [name], MD;  Location[facility] ENDOSCOPY;  Service: Pulmonary;;   BRONCHIAL BRUSHINGS  10/23/2019   Procedure: BRONCHIAL BRUSHINGS;  Surgeon: [name], MD;  Location: [facility] ENDOSCOPY;  Service: Pulmonary;;   BRONCHIAL BRUSHINGS  11/20/2019   Procedure: BRONCHIAL BRUSHINGS;  Surgeon: [name], MD;  Location: [facility] ENDOSCOPY;  Service: Pulmonary;;   BRONCHIAL NEEDLE ASPIRATION BIOPSY  10/23/2019   Procedure: BRONCHIAL NEEDLE ASPIRATION BIOPSIES;  Surgeon: [name], MD;  Location: [facility] ENDOSCOPY;  Service: Pulmonary;;   BRONCHIAL NEEDLE ASPIRATION BIOPSY  11/20/2019   Procedure: BRONCHIAL NEEDLE ASPIRATION BIOPSIES;  Surgeon: [name], MD;  Location: [facility] ENDOSCOPY;  Service: Pulmonary;;   BRONCHIAL WASHINGS  11/20/2019   Procedure: BRONCHIAL WASHINGS;  Surgeon: [name], MD;  Location: [facility];  Service: Pulmonary;;   CATARACT EXTRACTION Left    COLONOSCOPY  04-14-99   Dr Flossie Dibble polyp-TA in epic   POLYPECTOMY  04-14-99   POSTERIOR LAMINECTOMY / DECOMPRESSION CERVICAL SPINE     Dr Saintclair Halsted   RETINAL  DETACHMENT SURGERY     left eye, 2007 x2, 2008 x 3   ROTATOR CUFF REPAIR  2004   right   TONSILLECTOMY  [id number]   VIDEO BRONCHOSCOPY WITH ENDOBRONCHIAL NAVIGATION N/A 10/23/2019   Procedure: VIDEO BRONCHOSCOPY WITH ENDOBRONCHIAL NAVIGATION;  Surgeon: [name], MD;  Location: [facility] ENDOSCOPY;  Service: Pulmonary;  Laterality: N/A;   VIDEO BRONCHOSCOPY WITH ENDOBRONCHIAL NAVIGATION N/A 11/20/2019   Procedure: VIDEO BRONCHOSCOPY WITH ENDOBRONCHIAL NAVIGATION;  Surgeon: [name], MD;  Location: [facility] ENDOSCOPY;  Service: Pulmonary;  Laterality: N/A;   VIDEO BRONCHOSCOPY WITH ENDOBRONCHIAL ULTRASOUND N/A 10/23/2019   Procedure: VIDEO BRONCHOSCOPY WITH ENDOBRONCHIAL ULTRASOUND;  Surgeon: [name], MD;  Location: [facility] ENDOSCOPY;  Service: Pulmonary;  Laterality: N/A;    REVIEW OF SYSTEMS:   Review of Systems  Constitutional: Negative for appetite change, chills, fatigue, fever and unexpected weight change.  HENT: Negative for mouth sores, nosebleeds, sore throat and trouble swallowing.   Eyes: Negative for eye problems and icterus.  Respiratory: Negative for cough, hemoptysis, shortness of breath and wheezing.   Cardiovascular: Negative for chest pain and leg swelling.  Gastrointestinal: Negative for abdominal pain, constipation, diarrhea, nausea and vomiting.  Genitourinary: Negative for bladder incontinence, difficulty urinating, dysuria, frequency and hematuria.   Musculoskeletal: Negative for back pain, gait problem, neck pain and neck stiffness.  Skin: Negative for itching and rash.  Neurological: Negative for dizziness, extremity weakness, gait problem, headaches, light-headedness and seizures.  Hematological: Negative for adenopathy. Does not bruise/bleed easily.  Psychiatric/Behavioral: Negative for confusion, depression and sleep disturbance. The patient is not nervous/anxious.     PHYSICAL EXAMINATION:  Blood pressure 125/68, pulse 68, temperature 97.7 F (36.5 C),  temperature source Tympanic, resp. rate 17, height _0  (1.803 m), weight 189 lb 14.4 oz (86.1 kg), SpO2 96 %.  ECOG PERFORMANCE STATUS: 1 - Symptomatic but completely ambulatory  Physical Exam  Constitutional: Oriented to person, place, and time and well-developed, well-nourished, and in no distress.  HENT:  Head: Normocephalic and atraumatic.  Mouth/Throat: Oropharynx is clear and moist. No oropharyngeal exudate.  Eyes: Conjunctivae are normal. Right eye exhibits no discharge. Left eye exhibits no discharge. No scleral icterus.  Neck: Normal range of motion. Neck supple.  Cardiovascular: Normal rate, regular rhythm, normal heart sounds and intact distal pulses.   Pulmonary/Chest: Effort normal and breath sounds normal. Quiet breath sounds in all lung fields. No wheezes. No rales.  Abdominal: Soft. Bowel sounds are normal. Exhibits no distension and no mass. There is no tenderness.  Musculoskeletal: Normal range of motion. Exhibits no edema.  Lymphadenopathy:    No cervical adenopathy.  Neurological: Alert and oriented to person, place, and time. Exhibits normal muscle tone. Gait normal. Coordination normal. Uses a cane for ambulation.  Skin: Skin is warm and dry. No rash noted. Not diaphoretic. No erythema. No pallor.  Psychiatric: Mood, memory and judgment normal.  Vitals reviewed.  LABORATORY DATA: Lab Results  Component Value Date   WBC 9.8 12/17/2019   HGB 15.3 12/17/2019   HCT 47.2 12/17/2019   MCV 88.9 12/17/2019   PLT 309 12/17/2019      Chemistry      Component Value Date/Time   NA 137 12/17/2019 0951   NA 143 03/10/2017 0911   K 4.3 12/17/2019 0951   K 4.7 03/10/2017 0911   CL 103 12/17/2019 0951   CO2 28 12/17/2019 0951   CO2 30 (H) 03/10/2017 0911   BUN 9 12/17/2019 0951   BUN 10.7 03/10/2017 0911   CREATININE 0.71 12/17/2019 0951   CREATININE 0.8 03/10/2017 0911      Component Value Date/Time   CALCIUM 9.6 12/17/2019 0951   CALCIUM 9.4 03/10/2017 0911    ALKPHOS 79 12/17/2019 0951   ALKPHOS 76 03/10/2017 0911   AST 17 12/17/2019 0951   AST 34 03/10/2017 0911   ALT 16 12/17/2019 0951   ALT 45 03/10/2017 0911   BILITOT 1.3 (H) 12/17/2019 0951   BILITOT 1.41 (H) 03/10/2017 0911       RADIOGRAPHIC STUDIES:  DG Chest 2 View  Result Date: 11/27/2019 CLINICAL DATA:  Postprocedural pneumothorax. Pneumothorax status post bronchoscopy. EXAM: CHEST - 2 VIEW COMPARISON:  Radiograph 11/22/2019, PET CT 11/09/2019 FINDINGS: Stable  size and appearance of the left apical pneumothorax from prior exam, small in size. This is visualized under the left posterior third rib is well as laterally. No interval change from prior exam. Left upper lobe mass again seen. Persistent but diminished blunting of the left costophrenic angle, possible small effusion. Background emphysema and chronic hyperinflation. No other interval change. IMPRESSION: 1. Stable size and appearance of left apical pneumothorax from prior exam, small in size. 2. Left upper lobe mass again seen. Decreased blunting of the left costophrenic angle, suggesting persistent but improving small pleural effusion. Electronically Signed   By: Keith Rake M.D.   On: 11/27/2019 15:38   MR Brain W Wo Contrast  Result Date: 11/24/2019 CLINICAL DATA:  Staging of non-small cell lung carcinoma EXAM: MRI HEAD WITHOUT AND WITH CONTRAST TECHNIQUE: Multiplanar, multiecho pulse sequences of the brain and surrounding structures were obtained without and with intravenous contrast. CONTRAST:  39m GADAVIST GADOBUTROL 1 MMOL/ML IV SOLN COMPARISON:  None. FINDINGS: Brain: No acute infarct, acute hemorrhage or extra-axial collection. Multifocal hyperintense T2-weighted signal within the white matter. There is generalized atrophy without lobar predilection. Chronic microhemorrhage in the right basal ganglia. Normal midline structures. Vascular: Normal flow voids. Skull and upper cervical spine: Normal marrow signal.  Sinuses/Orbits: Bilateral ocular lens replacements. Left scleral banding. Paranasal sinuses are clear. Other: None IMPRESSION: 1. No intracranial metastatic disease. 2. Generalized atrophy and chronic microvascular ischemia. Electronically Signed   By: KUlyses JarredM.D.   On: 11/24/2019 03:43   DG Chest Port 1 View  Result Date: 11/22/2019 CLINICAL DATA:  Pneumothorax EXAM: PORTABLE CHEST 1 VIEW COMPARISON:  11/21/2019 and multiple prior studies FINDINGS: Trachea midline. Cardiomediastinal contours are stable. LEFT suprahilar and upper lobe mass is redemonstrated with persistent LEFT apical and lateral pneumothorax, unchanged with respect to size on frontal view. Blunting of LEFT costodiaphragmatic sulcus may reflect small effusion. There is a small sub pulmonic component to the pneumothorax previously, this is no longer evident. Emphysematous changes. RIGHT chest is clear. Visualized skeletal structures without acute process. IMPRESSION: 1. Unchanged LEFT apical and lateral pneumothorax. 2. Blunting of LEFT costodiaphragmatic sulcus may reflect small amount of pleural fluid in the LEFT chest. 3. LEFT suprahilar and upper lobe mass again noted. Electronically Signed   By: GZetta BillsM.D.   On: 11/22/2019 08:28   DG Chest Port 1 View  Result Date: 11/21/2019 CLINICAL DATA:  Pneumothorax after biopsy EXAM: PORTABLE CHEST 1 VIEW COMPARISON:  Earlier same day FINDINGS: Left apical pneumothorax is similar in size. Left upper lobe mass again noted. Emphysema. No new findings. Stable cardiomediastinal contours. IMPRESSION: Stable small left apical pneumothorax. Electronically Signed   By: PMacy MisM.D.   On: 11/21/2019 14:28   DG CHEST PORT 1 VIEW  Result Date: 11/21/2019 CLINICAL DATA:  Left pneumothorax. EXAM: PORTABLE CHEST 1 VIEW COMPARISON:  November 20, 2019. FINDINGS: The heart size and mediastinal contours are within normal limits. Right lung is clear. Stable mild left apical pneumothorax is  noted. Left upper lobe pulmonary mass is again noted. No significant pleural effusion is noted. The visualized skeletal structures are unremarkable. IMPRESSION: Stable mild left apical pneumothorax. Left upper lobe pulmonary mass is again noted. Aortic Atherosclerosis (ICD10-I70.0). Electronically Signed   By: JMarijo ConceptionM.D.   On: 11/21/2019 08:07   DG CHEST PORT 1 VIEW  Result Date: 11/20/2019 CLINICAL DATA:  Pneumothorax EXAM: PORTABLE CHEST 1 VIEW COMPARISON:  11:55 a.m. FINDINGS: The lungs are mildly hyperinflated in keeping  with changes of underlying COPD. Small left apical pneumothorax is again identified has slightly increased in size since prior examination. No mediastinal shift or hyperexpansion of the left hemithorax to suggest tension physiology. Left upper lobe pulmonary mass is again noted. No pleural effusion. Cardiac size within normal limits. Pulmonary vascularity is normal. IMPRESSION: 1. Slight interval increase in size of small left apical pneumothorax. 2. Left upper lobe pulmonary mass again noted. 3. COPD. Electronically Signed   By: Fidela Salisbury MD   On: 11/20/2019 19:29   DG Chest Port 1 View  Result Date: 11/20/2019 CLINICAL DATA:  Status post bronchoscopy. EXAM: PORTABLE CHEST 1 VIEW COMPARISON:  10/23/2019 FINDINGS: Left upper lobe pulmonary mass again noted. Tiny left apical pneumothorax superimposed on the anterior left first rib. No pleural effusion. Interstitial markings are diffusely coarsened with chronic features. The cardiopericardial silhouette is within normal limits for size. The visualized bony structures of the thorax show now acute abnormality. Telemetry leads overlie the chest. IMPRESSION: 1. Tiny left apical pneumothorax superimposed on the anterior left first rib. 2. Left upper lobe pulmonary mass. These results will be called to the ordering clinician or representative by the Radiologist Assistant, and communication documented in the PACS or [employer]. Electronically Signed   By: Misty Stanley M.D.   On: 11/20/2019 11:42   DG C-ARM BRONCHOSCOPY  Result Date: 11/20/2019 C-ARM BRONCHOSCOPY: Fluoroscopy was utilized by the requesting physician.  No radiographic interpretation.     ASSESSMENT/PLAN:  This is a very pleasant 74 year old Caucasian male referred to the clinic for evaluation of stage IIIb non-small cell lung cancer, squamous cell carcinoma.  The patient presented with a left upper lobe lung mass as well as associated mediastinal lymphadenopathy and contralateral right hilar mild hypermetabolism.  There was heterogeneous marrow uptake in general in the spine but with more focal areas on the L4 level without imaging correlation on CT.  His PDL1 expression is 2%. He was diagnosed in August 2021.  The patient is currently undergoing concurrent chemoradiation with carboplatin for an AUC of 2 and paclitaxel 45 mg per metered squared.  The patient is status post 1 cycle and tolerated well without any adverse side effects.  The last day of radiation is scheduled for November first, 2021 under the care of Dr. Sondra Come.  Labs were reviewed.  Recommend that he proceed with cycle #2 today scheduled.  We will see him back for follow-up visit in 2 weeks for evaluation before starting cycle #4.  The patient was advised to call immediately if he has any concerning symptoms in the interval. The patient voices understanding of current disease status and treatment options and is in agreement with the current care plan. All questions were answered. The patient knows to call the clinic with any problems, questions or concerns. We can certainly see the patient much sooner if necessary   No orders of the defined types were placed in this encounter.    Gerturde Kuba L Dquan Cortopassi, PA-C 12/17/19

## 2019-12-16 ENCOUNTER — Other Ambulatory Visit: Payer: Self-pay | Admitting: Cardiology

## 2019-12-17 ENCOUNTER — Ambulatory Visit
Admission: RE | Admit: 2019-12-17 | Discharge: 2019-12-17 | Disposition: A | Discharge status: Home / Self Care | Payer: Medicare Other | Source: Ambulatory Visit | Attending: Radiation Oncology | Admitting: Radiation Oncology

## 2019-12-17 ENCOUNTER — Inpatient Hospital Stay: Payer: Medicare Other

## 2019-12-17 ENCOUNTER — Other Ambulatory Visit: Payer: Self-pay

## 2019-12-17 ENCOUNTER — Inpatient Hospital Stay (HOSPITAL_BASED_OUTPATIENT_CLINIC_OR_DEPARTMENT_OTHER): Payer: Medicare Other | Admitting: Physician Assistant

## 2019-12-17 VITALS — BP 125/68 | HR 68 | Temp 97.7°F | Resp 17 | Ht 71.0 in | Wt 189.9 lb

## 2019-12-17 DIAGNOSIS — Z5111 Encounter for antineoplastic chemotherapy: Secondary | ICD-10-CM | POA: Diagnosis not present

## 2019-12-17 DIAGNOSIS — Z87891 Personal history of nicotine dependence: Secondary | ICD-10-CM | POA: Diagnosis not present

## 2019-12-17 DIAGNOSIS — C3492 Malignant neoplasm of unspecified part of left bronchus or lung: Secondary | ICD-10-CM | POA: Diagnosis not present

## 2019-12-17 DIAGNOSIS — Z510 Encounter for antineoplastic radiation therapy: Secondary | ICD-10-CM | POA: Diagnosis not present

## 2019-12-17 DIAGNOSIS — C9110 Chronic lymphocytic leukemia of B-cell type not having achieved remission: Secondary | ICD-10-CM | POA: Diagnosis not present

## 2019-12-17 DIAGNOSIS — C3412 Malignant neoplasm of upper lobe, left bronchus or lung: Secondary | ICD-10-CM | POA: Diagnosis not present

## 2019-12-17 DIAGNOSIS — I10 Essential (primary) hypertension: Secondary | ICD-10-CM | POA: Diagnosis not present

## 2019-12-17 DIAGNOSIS — I480 Paroxysmal atrial fibrillation: Secondary | ICD-10-CM | POA: Diagnosis not present

## 2019-12-17 LAB — CBC WITH DIFFERENTIAL (CANCER CENTER ONLY)
Abs Immature Granulocytes: 0.06 10*3/uL (ref 0.00–0.07)
Basophils Absolute: 0 10*3/uL (ref 0.0–0.1)
Basophils Relative: 0 %
Eosinophils Absolute: 0.2 10*3/uL (ref 0.0–0.5)
Eosinophils Relative: 2 %
HCT: 47.2 % (ref 39.0–52.0)
Hemoglobin: 15.3 g/dL (ref 13.0–17.0)
Immature Granulocytes: 1 %
Lymphocytes Relative: 34 %
Lymphs Abs: 3.3 10*3/uL (ref 0.7–4.0)
MCH: 28.8 pg (ref 26.0–34.0)
MCHC: 32.4 g/dL (ref 30.0–36.0)
MCV: 88.9 fL (ref 80.0–100.0)
Monocytes Absolute: 0.6 10*3/uL (ref 0.1–1.0)
Monocytes Relative: 6 %
Neutro Abs: 5.6 10*3/uL (ref 1.7–7.7)
Neutrophils Relative %: 57 %
Platelet Count: 309 10*3/uL (ref 150–400)
RBC: 5.31 MIL/uL (ref 4.22–5.81)
RDW: 13.2 % (ref 11.5–15.5)
WBC Count: 9.8 10*3/uL (ref 4.0–10.5)
nRBC: 0 % (ref 0.0–0.2)

## 2019-12-17 LAB — CMP (CANCER CENTER ONLY)
ALT: 16 U/L (ref 0–44)
AST: 17 U/L (ref 15–41)
Albumin: 3.4 g/dL — ABNORMAL LOW (ref 3.5–5.0)
Alkaline Phosphatase: 79 U/L (ref 38–126)
Anion gap: 6 (ref 5–15)
BUN: 9 mg/dL (ref 8–23)
CO2: 28 mmol/L (ref 22–32)
Calcium: 9.6 mg/dL (ref 8.9–10.3)
Chloride: 103 mmol/L (ref 98–111)
Creatinine: 0.71 mg/dL (ref 0.61–1.24)
GFR, Est AFR Am: >60 mL/min (ref >60)
GFR, Estimated: >60 mL/min (ref >60)
Glucose, Bld: 121 mg/dL — ABNORMAL HIGH (ref 70–99)
Potassium: 4.3 mmol/L (ref 3.5–5.1)
Sodium: 137 mmol/L (ref 135–145)
Total Bilirubin: 1.3 mg/dL — ABNORMAL HIGH (ref 0.3–1.2)
Total Protein: 7.4 g/dL (ref 6.5–8.1)

## 2019-12-17 MED ORDER — SODIUM CHLORIDE 0.9 % IV SOLN
Freq: Once | INTRAVENOUS | Status: AC
  Filled 2019-12-17: qty 250

## 2019-12-17 MED ORDER — FAMOTIDINE IN NACL 20-0.9 MG/50ML-% IV SOLN
INTRAVENOUS | Status: AC
  Filled 2019-12-17: qty 50

## 2019-12-17 MED ORDER — DIPHENHYDRAMINE HCL 50 MG/ML IJ SOLN
50.0000 mg | Freq: Once | INTRAMUSCULAR | Status: AC
  Administered 2019-12-17: 50 mg via INTRAVENOUS

## 2019-12-17 MED ORDER — FAMOTIDINE IN NACL 20-0.9 MG/50ML-% IV SOLN
20.0000 mg | Freq: Once | INTRAVENOUS | Status: AC
  Administered 2019-12-17: 20 mg via INTRAVENOUS

## 2019-12-17 MED ORDER — PALONOSETRON HCL INJECTION 0.25 MG/5ML
INTRAVENOUS | Status: AC
  Filled 2019-12-17: qty 5

## 2019-12-17 MED ORDER — SODIUM CHLORIDE 0.9 % IV SOLN
212.0000 mg | Freq: Once | INTRAVENOUS | Status: AC
  Administered 2019-12-17: 210 mg via INTRAVENOUS
  Filled 2019-12-17: qty 21

## 2019-12-17 MED ORDER — SODIUM CHLORIDE 0.9 % IV SOLN
20.0000 mg | Freq: Once | INTRAVENOUS | Status: AC
  Administered 2019-12-17: 20 mg via INTRAVENOUS
  Filled 2019-12-17: qty 20

## 2019-12-17 MED ORDER — DIPHENHYDRAMINE HCL 50 MG/ML IJ SOLN
INTRAMUSCULAR | Status: AC
  Filled 2019-12-17: qty 1

## 2019-12-17 MED ORDER — SODIUM CHLORIDE 0.9 % IV SOLN
45.0000 mg/m2 | Freq: Once | INTRAVENOUS | Status: AC
  Administered 2019-12-17: 96 mg via INTRAVENOUS
  Filled 2019-12-17: qty 16

## 2019-12-17 MED ORDER — PALONOSETRON HCL INJECTION 0.25 MG/5ML
0.2500 mg | Freq: Once | INTRAVENOUS | Status: AC
  Administered 2019-12-17: 0.25 mg via INTRAVENOUS

## 2019-12-17 NOTE — Patient Instructions (Signed)
[facility] Discharge Instructions for Patients Receiving Chemotherapy  Today you received the following chemotherapy agents: Paclitaxel, Carboplatin  To help prevent nausea and vomiting after your treatment, we encourage you to take your nausea medication as directed.   If you develop nausea and vomiting that is not controlled by your nausea medication, call the clinic.   BELOW ARE SYMPTOMS THAT SHOULD BE REPORTED IMMEDIATELY:  *FEVER GREATER THAN 100.5 F  *CHILLS WITH OR WITHOUT FEVER  NAUSEA AND VOMITING THAT IS NOT CONTROLLED WITH YOUR NAUSEA MEDICATION  *UNUSUAL SHORTNESS OF BREATH  *UNUSUAL BRUISING OR BLEEDING  TENDERNESS IN MOUTH AND THROAT WITH OR WITHOUT PRESENCE OF ULCERS  *URINARY PROBLEMS  *BOWEL PROBLEMS  UNUSUAL RASH Items with * indicate a potential emergency and should be followed up as soon as possible.  Feel free to call the clinic should you have any questions or concerns. The clinic phone number is [phone].  Please show the [place] at check-in to the Emergency Department and triage nurse.

## 2019-12-17 NOTE — Telephone Encounter (Signed)
Prescription refill request for Eliquis received.  Last office visit: [place], 11/05/2019 Scr: 0.71, 12/17/2019 Age: 74 Weight: 86.1 kg   Prescription refill sent.

## 2019-12-18 ENCOUNTER — Other Ambulatory Visit: Payer: Self-pay

## 2019-12-18 ENCOUNTER — Ambulatory Visit
Admission: RE | Admit: 2019-12-18 | Discharge: 2019-12-18 | Disposition: A | Discharge status: Home / Self Care | Payer: Medicare Other | Source: Ambulatory Visit | Attending: Radiation Oncology | Admitting: Radiation Oncology

## 2019-12-18 DIAGNOSIS — Z510 Encounter for antineoplastic radiation therapy: Secondary | ICD-10-CM | POA: Diagnosis not present

## 2019-12-18 DIAGNOSIS — C3412 Malignant neoplasm of upper lobe, left bronchus or lung: Secondary | ICD-10-CM | POA: Diagnosis not present

## 2019-12-19 ENCOUNTER — Ambulatory Visit
Admission: RE | Admit: 2019-12-19 | Discharge: 2019-12-19 | Disposition: A | Discharge status: Home / Self Care | Payer: Medicare Other | Source: Ambulatory Visit | Attending: Radiation Oncology | Admitting: Radiation Oncology

## 2019-12-19 ENCOUNTER — Other Ambulatory Visit: Payer: Self-pay

## 2019-12-19 DIAGNOSIS — C3412 Malignant neoplasm of upper lobe, left bronchus or lung: Secondary | ICD-10-CM | POA: Diagnosis not present

## 2019-12-19 DIAGNOSIS — Z510 Encounter for antineoplastic radiation therapy: Secondary | ICD-10-CM | POA: Diagnosis not present

## 2019-12-20 ENCOUNTER — Ambulatory Visit
Admission: RE | Admit: 2019-12-20 | Discharge: 2019-12-20 | Disposition: A | Discharge status: Home / Self Care | Payer: Medicare Other | Source: Ambulatory Visit | Attending: Radiation Oncology | Admitting: Radiation Oncology

## 2019-12-20 ENCOUNTER — Other Ambulatory Visit: Payer: Self-pay

## 2019-12-20 DIAGNOSIS — C3412 Malignant neoplasm of upper lobe, left bronchus or lung: Secondary | ICD-10-CM | POA: Diagnosis not present

## 2019-12-20 DIAGNOSIS — Z510 Encounter for antineoplastic radiation therapy: Secondary | ICD-10-CM | POA: Diagnosis not present

## 2019-12-21 ENCOUNTER — Ambulatory Visit
Admission: RE | Admit: 2019-12-21 | Discharge: 2019-12-21 | Disposition: A | Discharge status: Home / Self Care | Payer: Medicare Other | Source: Ambulatory Visit | Attending: Radiation Oncology | Admitting: Radiation Oncology

## 2019-12-21 DIAGNOSIS — C3412 Malignant neoplasm of upper lobe, left bronchus or lung: Secondary | ICD-10-CM | POA: Insufficient documentation

## 2019-12-21 DIAGNOSIS — Z510 Encounter for antineoplastic radiation therapy: Secondary | ICD-10-CM | POA: Insufficient documentation

## 2019-12-24 ENCOUNTER — Inpatient Hospital Stay: Payer: Medicare Other | Attending: Hematology

## 2019-12-24 ENCOUNTER — Other Ambulatory Visit: Payer: Self-pay

## 2019-12-24 ENCOUNTER — Inpatient Hospital Stay: Payer: Medicare Other

## 2019-12-24 ENCOUNTER — Ambulatory Visit
Admission: RE | Admit: 2019-12-24 | Discharge: 2019-12-24 | Disposition: A | Discharge status: Home / Self Care | Payer: Medicare Other | Source: Ambulatory Visit | Attending: Radiation Oncology | Admitting: Radiation Oncology

## 2019-12-24 VITALS — BP 138/79 | HR 73 | Temp 98.1°F | Resp 16

## 2019-12-24 DIAGNOSIS — I10 Essential (primary) hypertension: Secondary | ICD-10-CM | POA: Insufficient documentation

## 2019-12-24 DIAGNOSIS — C3492 Malignant neoplasm of unspecified part of left bronchus or lung: Secondary | ICD-10-CM

## 2019-12-24 DIAGNOSIS — C3412 Malignant neoplasm of upper lobe, left bronchus or lung: Secondary | ICD-10-CM | POA: Insufficient documentation

## 2019-12-24 DIAGNOSIS — Z79899 Other long term (current) drug therapy: Secondary | ICD-10-CM | POA: Diagnosis not present

## 2019-12-24 DIAGNOSIS — Z87891 Personal history of nicotine dependence: Secondary | ICD-10-CM | POA: Insufficient documentation

## 2019-12-24 DIAGNOSIS — Z23 Encounter for immunization: Secondary | ICD-10-CM | POA: Insufficient documentation

## 2019-12-24 DIAGNOSIS — Z7901 Long term (current) use of anticoagulants: Secondary | ICD-10-CM | POA: Insufficient documentation

## 2019-12-24 DIAGNOSIS — Z5111 Encounter for antineoplastic chemotherapy: Secondary | ICD-10-CM | POA: Diagnosis not present

## 2019-12-24 DIAGNOSIS — G4733 Obstructive sleep apnea (adult) (pediatric): Secondary | ICD-10-CM | POA: Diagnosis not present

## 2019-12-24 DIAGNOSIS — J449 Chronic obstructive pulmonary disease, unspecified: Secondary | ICD-10-CM | POA: Diagnosis not present

## 2019-12-24 DIAGNOSIS — E039 Hypothyroidism, unspecified: Secondary | ICD-10-CM | POA: Diagnosis not present

## 2019-12-24 DIAGNOSIS — R634 Abnormal weight loss: Secondary | ICD-10-CM | POA: Insufficient documentation

## 2019-12-24 DIAGNOSIS — Z9989 Dependence on other enabling machines and devices: Secondary | ICD-10-CM | POA: Insufficient documentation

## 2019-12-24 DIAGNOSIS — E785 Hyperlipidemia, unspecified: Secondary | ICD-10-CM | POA: Insufficient documentation

## 2019-12-24 DIAGNOSIS — C9110 Chronic lymphocytic leukemia of B-cell type not having achieved remission: Secondary | ICD-10-CM | POA: Diagnosis not present

## 2019-12-24 DIAGNOSIS — Z510 Encounter for antineoplastic radiation therapy: Secondary | ICD-10-CM | POA: Diagnosis not present

## 2019-12-24 LAB — CMP (CANCER CENTER ONLY)
ALT: 18 U/L (ref 0–44)
AST: 18 U/L (ref 15–41)
Albumin: 3.5 g/dL (ref 3.5–5.0)
Alkaline Phosphatase: 69 U/L (ref 38–126)
Anion gap: 6 (ref 5–15)
BUN: 9 mg/dL (ref 8–23)
CO2: 26 mmol/L (ref 22–32)
Calcium: 9.7 mg/dL (ref 8.9–10.3)
Chloride: 105 mmol/L (ref 98–111)
Creatinine: 0.75 mg/dL (ref 0.61–1.24)
GFR, Est AFR Am: >60 mL/min (ref >60)
GFR, Estimated: >60 mL/min (ref >60)
Glucose, Bld: 109 mg/dL — ABNORMAL HIGH (ref 70–99)
Potassium: 4.3 mmol/L (ref 3.5–5.1)
Sodium: 137 mmol/L (ref 135–145)
Total Bilirubin: 1.3 mg/dL — ABNORMAL HIGH (ref 0.3–1.2)
Total Protein: 7.3 g/dL (ref 6.5–8.1)

## 2019-12-24 LAB — CBC WITH DIFFERENTIAL (CANCER CENTER ONLY)
Abs Immature Granulocytes: 0.02 10*3/uL (ref 0.00–0.07)
Basophils Absolute: 0 10*3/uL (ref 0.0–0.1)
Basophils Relative: 1 %
Eosinophils Absolute: 0.1 10*3/uL (ref 0.0–0.5)
Eosinophils Relative: 1 %
HCT: 45.7 % (ref 39.0–52.0)
Hemoglobin: 14.9 g/dL (ref 13.0–17.0)
Immature Granulocytes: 0 %
Lymphocytes Relative: 34 %
Lymphs Abs: 1.9 10*3/uL (ref 0.7–4.0)
MCH: 28.9 pg (ref 26.0–34.0)
MCHC: 32.6 g/dL (ref 30.0–36.0)
MCV: 88.6 fL (ref 80.0–100.0)
Monocytes Absolute: 0.6 10*3/uL (ref 0.1–1.0)
Monocytes Relative: 11 %
Neutro Abs: 3 10*3/uL (ref 1.7–7.7)
Neutrophils Relative %: 53 %
Platelet Count: 268 10*3/uL (ref 150–400)
RBC: 5.16 MIL/uL (ref 4.22–5.81)
RDW: 13.3 % (ref 11.5–15.5)
WBC Count: 5.6 10*3/uL (ref 4.0–10.5)
nRBC: 0 % (ref 0.0–0.2)

## 2019-12-24 MED ORDER — DIPHENHYDRAMINE HCL 50 MG/ML IJ SOLN
50.0000 mg | Freq: Once | INTRAMUSCULAR | Status: AC
  Administered 2019-12-24: 50 mg via INTRAVENOUS

## 2019-12-24 MED ORDER — SODIUM CHLORIDE 0.9 % IV SOLN
45.0000 mg/m2 | Freq: Once | INTRAVENOUS | Status: AC
  Administered 2019-12-24: 96 mg via INTRAVENOUS
  Filled 2019-12-24: qty 16

## 2019-12-24 MED ORDER — HEPARIN SOD (PORK) LOCK FLUSH 100 UNIT/ML IV SOLN
500.0000 [IU] | Freq: Once | INTRAVENOUS | Status: DC | PRN
  Filled 2019-12-24: qty 5

## 2019-12-24 MED ORDER — SODIUM CHLORIDE 0.9 % IV SOLN
20.0000 mg | Freq: Once | INTRAVENOUS | Status: AC
  Administered 2019-12-24: 20 mg via INTRAVENOUS
  Filled 2019-12-24: qty 20

## 2019-12-24 MED ORDER — SODIUM CHLORIDE 0.9 % IV SOLN
Freq: Once | INTRAVENOUS | Status: AC
  Filled 2019-12-24: qty 250

## 2019-12-24 MED ORDER — PALONOSETRON HCL INJECTION 0.25 MG/5ML
INTRAVENOUS | Status: AC
  Filled 2019-12-24: qty 5

## 2019-12-24 MED ORDER — FAMOTIDINE IN NACL 20-0.9 MG/50ML-% IV SOLN
INTRAVENOUS | Status: AC
  Filled 2019-12-24: qty 50

## 2019-12-24 MED ORDER — SODIUM CHLORIDE 0.9% FLUSH
10.0000 mL | INTRAVENOUS | Status: DC | PRN
  Filled 2019-12-24: qty 10

## 2019-12-24 MED ORDER — INFLUENZA VAC A&B SA ADJ QUAD 0.5 ML IM PRSY
0.5000 mL | PREFILLED_SYRINGE | Freq: Once | INTRAMUSCULAR | Status: AC
  Administered 2019-12-24: 0.5 mL via INTRAMUSCULAR

## 2019-12-24 MED ORDER — SODIUM CHLORIDE 0.9 % IV SOLN
210.0000 mg | Freq: Once | INTRAVENOUS | Status: AC
  Administered 2019-12-24: 210 mg via INTRAVENOUS
  Filled 2019-12-24: qty 21

## 2019-12-24 MED ORDER — DIPHENHYDRAMINE HCL 50 MG/ML IJ SOLN
INTRAMUSCULAR | Status: AC
  Filled 2019-12-24: qty 1

## 2019-12-24 MED ORDER — FAMOTIDINE IN NACL 20-0.9 MG/50ML-% IV SOLN
20.0000 mg | Freq: Once | INTRAVENOUS | Status: AC
  Administered 2019-12-24: 20 mg via INTRAVENOUS

## 2019-12-24 MED ORDER — PALONOSETRON HCL INJECTION 0.25 MG/5ML
0.2500 mg | Freq: Once | INTRAVENOUS | Status: AC
  Administered 2019-12-24: 0.25 mg via INTRAVENOUS

## 2019-12-24 MED ORDER — INFLUENZA VAC A&B SA ADJ QUAD 0.5 ML IM PRSY
PREFILLED_SYRINGE | INTRAMUSCULAR | Status: AC
  Filled 2019-12-24: qty 0.5

## 2019-12-24 NOTE — Patient Instructions (Signed)
[facility] Discharge Instructions for Patients Receiving Chemotherapy  Today you received the following chemotherapy agents: Paclitaxel, Carboplatin  To help prevent nausea and vomiting after your treatment, we encourage you to take your nausea medication as directed.   If you develop nausea and vomiting that is not controlled by your nausea medication, call the clinic.   BELOW ARE SYMPTOMS THAT SHOULD BE REPORTED IMMEDIATELY:  *FEVER GREATER THAN 100.5 F  *CHILLS WITH OR WITHOUT FEVER  NAUSEA AND VOMITING THAT IS NOT CONTROLLED WITH YOUR NAUSEA MEDICATION  *UNUSUAL SHORTNESS OF BREATH  *UNUSUAL BRUISING OR BLEEDING  TENDERNESS IN MOUTH AND THROAT WITH OR WITHOUT PRESENCE OF ULCERS  *URINARY PROBLEMS  *BOWEL PROBLEMS  UNUSUAL RASH Items with * indicate a potential emergency and should be followed up as soon as possible.  Feel free to call the clinic should you have any questions or concerns. The clinic phone number is [phone].  Please show the [facility] at check-in to the Emergency Department and triage nurse.

## 2019-12-25 ENCOUNTER — Other Ambulatory Visit: Payer: Self-pay

## 2019-12-25 ENCOUNTER — Ambulatory Visit
Admission: RE | Admit: 2019-12-25 | Discharge: 2019-12-25 | Disposition: A | Discharge status: Home / Self Care | Payer: Medicare Other | Source: Ambulatory Visit | Attending: Radiation Oncology | Admitting: Radiation Oncology

## 2019-12-25 DIAGNOSIS — C3412 Malignant neoplasm of upper lobe, left bronchus or lung: Secondary | ICD-10-CM | POA: Diagnosis not present

## 2019-12-25 DIAGNOSIS — Z510 Encounter for antineoplastic radiation therapy: Secondary | ICD-10-CM | POA: Diagnosis not present

## 2019-12-26 ENCOUNTER — Ambulatory Visit
Admission: RE | Admit: 2019-12-26 | Discharge: 2019-12-26 | Disposition: A | Discharge status: Home / Self Care | Payer: Medicare Other | Source: Ambulatory Visit | Attending: Radiation Oncology | Admitting: Radiation Oncology

## 2019-12-26 DIAGNOSIS — Z510 Encounter for antineoplastic radiation therapy: Secondary | ICD-10-CM | POA: Diagnosis not present

## 2019-12-26 DIAGNOSIS — C3412 Malignant neoplasm of upper lobe, left bronchus or lung: Secondary | ICD-10-CM | POA: Diagnosis not present

## 2019-12-27 ENCOUNTER — Ambulatory Visit
Admission: RE | Admit: 2019-12-27 | Discharge: 2019-12-27 | Disposition: A | Discharge status: Home / Self Care | Payer: Medicare Other | Source: Ambulatory Visit | Attending: Radiation Oncology | Admitting: Radiation Oncology

## 2019-12-27 ENCOUNTER — Other Ambulatory Visit: Payer: Self-pay

## 2019-12-27 DIAGNOSIS — Z510 Encounter for antineoplastic radiation therapy: Secondary | ICD-10-CM | POA: Diagnosis not present

## 2019-12-27 DIAGNOSIS — C3412 Malignant neoplasm of upper lobe, left bronchus or lung: Secondary | ICD-10-CM | POA: Diagnosis not present

## 2019-12-28 ENCOUNTER — Ambulatory Visit
Admission: RE | Admit: 2019-12-28 | Discharge: 2019-12-28 | Disposition: A | Discharge status: Home / Self Care | Payer: Medicare Other | Source: Ambulatory Visit | Attending: Radiation Oncology | Admitting: Radiation Oncology

## 2019-12-28 ENCOUNTER — Telehealth: Payer: Self-pay | Admitting: Emergency Medicine

## 2019-12-28 DIAGNOSIS — Z510 Encounter for antineoplastic radiation therapy: Secondary | ICD-10-CM | POA: Diagnosis not present

## 2019-12-28 DIAGNOSIS — C3412 Malignant neoplasm of upper lobe, left bronchus or lung: Secondary | ICD-10-CM | POA: Diagnosis not present

## 2019-12-28 NOTE — Progress Notes (Signed)
[facility] OFFICE PROGRESS NOTE  [name], [name], MD [place] [address]  DIAGNOSIS: 1) Stage IIIbnon-small cell lung cancer, squamous cell carcinoma. The patient presented with a left upper lobe lung mass as well as associated mediastinal lymphadenopathy and contralateral right hilar mild hypermetabolism. There was heterogeneous marrow uptake in the spine but with more focal areas at L4 without imaging correlation on CT scan. The patient was diagnosed in August 2021. 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 First dose expected on 12/13/2019. Status post 3 cycles.   INTERVAL HISTORY: Joe [name] 74 y.o. male returns to clinic today forafollow-up visit. The patient is feeling fairtoday without any concerning complaints.The patient has been tolerating his treatment well without any concerning adverse side effects except the benadryl pre-medications makes his cognition slow per patient report and makes his mind "blank". Discussed the benadryl premedication and he still would like to continue with the normal 50 mg dose. Today he denies any recent fever, chills, or night sweats. He lost a few pounds since his last appointment. He denies significant odynophagia but is starting to have mild dysphagia and describes it as food having slow transit. He said it is moreso annoying rather than painful.  He states his breathing/shortness of breath has significantly improved. He denies significant cough. He denies any nausea, vomiting, constipation, or diarrhea. He denies any headache or visual changes.  The patient's last day of radiation is scheduled for 01/21/2020.  He is interested in receiving his COVID booster vaccine in a few weeks. He is here today for evaluation before starting cycle #4 of  treatment.   MEDICAL HISTORY: Past Medical History:  Diagnosis Date  . Alcohol abuse, daily use 08/24/2010   Stopped June 04, 2013   . Arthralgia 2013-12-27   9/15 Not related to statins OA   . Cataract   . 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 CLL (chronic lymphocytic leukemia) ([place]) 08/08/2014   05-Jun-2014 Dr Burr Medico Stage 0  . COPD mixed type ([place]) 07/26/2007   Smoker - stopped 6/14   . De Quervain's tenosynovitis, right 2013-12-27   Jun 04, 2013   . Depression    at times  . Dysrhythmia    PAF  . Dysuria 12-27-2013   9/15 - poss stricture Urol ref was offered   . Gallstones 11/16/2017   Asymptomatic Pt refused surg ref  . Generalized anxiety disorder 09/07/2012   Chronic   Potential benefits of a long term steroid  use as well as potential risks  and complications were explained to the patient and were aknowledged.     [name] GERD 12/02/2006   Chronic     . Grief 05-28-16   [name] died in 06/05/14  . Gynecomastia December 27, 2013   Benign B Jun 04, 2013   . Hyperlipidemia   . Hypertension   . Hypothyroidism 12/25/2014   06/05/14 On Levothyroxine   . Intertrigo 02/02/2012   11/13   . Neoplasm of uncertain behavior of skin 03/12/2013   12/14 R ear, chest   . OSA on CPAP    mild with AHI 9/hr and oxygen desats as low as 75%  . Paresis ([place])    right- s/p cerv decompression  . PERIORBITAL CELLULITIS 02/22/2009   Qualifier: Diagnosis of  By: Diona Browner MD, Amy    .  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  MEDICATIONS:  Current Outpatient Medications  Medication Sig Dispense Refill  . amLODipine (NORVASC) 5 MG tablet Take 1 tablet (5 mg total) by mouth daily. 90 tablet 3  . atenolol (TENORMIN) 25 MG tablet Take 1 tablet (25 mg total) by mouth daily as needed (for palpitations). 90 tablet 3  . Cholecalciferol (EQL VITAMIN D3) 1000 UNITS tablet Take 1,000 Units by mouth daily.      [name] ELIQUIS 5 MG TABS tablet TAKE 1 TABLET(5 MG) BY MOUTH TWICE DAILY 180 tablet 1   . levothyroxine (SYNTHROID) 50 MCG tablet Take 1 tablet (50 mcg total) by mouth daily. 90 tablet 3  . lovastatin (MEVACOR) 20 MG tablet Take 1 tablet (20 mg total) by mouth at bedtime. 90 tablet 3  . Multiple Vitamin (MULTIVITAMIN) tablet Take 1 tablet by mouth daily. Centrum Silver.    Vladimir Faster Glycol-Propyl Glycol (SYSTANE) 0.4-0.3 % SOLN Place 1 drop into both eyes daily as needed (Dry eyes). Ultra    . prochlorperazine (COMPAZINE) 10 MG tablet Take 1 tablet (10 mg total) by mouth every 6 (six) hours as needed. 30 tablet 2  . diazepam (VALIUM) 5 MG tablet Take 1 tablet (5 mg total) by mouth daily. (Patient not taking: Reported on 01/01/2020)     No current facility-administered medications for this visit.    SURGICAL HISTORY:  Past Surgical History:  Procedure Laterality Date  . BICEPS TENDON REPAIR    . BRONCHIAL BIOPSY  10/23/2019   Procedure: BRONCHIAL BIOPSIES;  Surgeon: [name], MD;  Location: [facility] ENDOSCOPY;  Service: Pulmonary;;  . BRONCHIAL BIOPSY  11/20/2019   Procedure: BRONCHIAL BIOPSIES;  Surgeon: [name], MD;  Location: [facility] ENDOSCOPY;  Service: Pulmonary;;  . BRONCHIAL BRUSHINGS  10/23/2019   Procedure: BRONCHIAL BRUSHINGS;  Surgeon: [name], MD;  Location: [facility] ENDOSCOPY;  Service: Pulmonary;;  . BRONCHIAL BRUSHINGS  11/20/2019   Procedure: BRONCHIAL BRUSHINGS;  Surgeon: [name], MD;  Location: [facility] ENDOSCOPY;  Service: Pulmonary;;  . BRONCHIAL NEEDLE ASPIRATION BIOPSY  10/23/2019   Procedure: BRONCHIAL NEEDLE ASPIRATION BIOPSIES;  Surgeon: [name], MD;  Location: [facility] ENDOSCOPY;  Service: Pulmonary;;  . BRONCHIAL NEEDLE ASPIRATION BIOPSY  11/20/2019   Procedure: BRONCHIAL NEEDLE ASPIRATION BIOPSIES;  Surgeon: [name], MD;  Location: [facility];  Service: Pulmonary;;  . BRONCHIAL WASHINGS  11/20/2019   Procedure: BRONCHIAL WASHINGS;  Surgeon: [name], MD;  Location: [facility] ENDOSCOPY;  Service: Pulmonary;;  . CATARACT EXTRACTION Left    . COLONOSCOPY  04-14-99   Dr Flossie Dibble polyp-TA in epic  . POLYPECTOMY  04-14-99  . POSTERIOR LAMINECTOMY / DECOMPRESSION CERVICAL SPINE     Dr Saintclair Halsted  . RETINAL DETACHMENT SURGERY     left eye, 2007 x2, 2008 x 3  . ROTATOR CUFF REPAIR  2004   right  . TONSILLECTOMY  [id number]  . VIDEO BRONCHOSCOPY WITH ENDOBRONCHIAL NAVIGATION N/A 10/23/2019   Procedure: VIDEO BRONCHOSCOPY WITH ENDOBRONCHIAL NAVIGATION;  Surgeon: [name], MD;  Location: [facility] ENDOSCOPY;  Service: Pulmonary;  Laterality: N/A;  . VIDEO BRONCHOSCOPY WITH ENDOBRONCHIAL NAVIGATION N/A 11/20/2019   Procedure: VIDEO BRONCHOSCOPY WITH ENDOBRONCHIAL NAVIGATION;  Surgeon: [name], MD;  Location: [facility] ENDOSCOPY;  Service: Pulmonary;  Laterality: N/A;  . VIDEO BRONCHOSCOPY WITH ENDOBRONCHIAL ULTRASOUND N/A 10/23/2019   Procedure: VIDEO BRONCHOSCOPY WITH ENDOBRONCHIAL ULTRASOUND;  Surgeon: [name], MD;  Location: [facility];  Service: Pulmonary;  Laterality: N/A;    REVIEW OF SYSTEMS:   Review of Systems  Constitutional: Positive for a few lb weight loss. Negative for appetite change, chills, fatigue, and fever. HENT: Negative for mouth sores, nosebleeds, sore throat and trouble swallowing.   Eyes: Negative for eye problems and icterus.  Respiratory: Negative for cough, hemoptysis, shortness of breath and wheezing.   Cardiovascular: Negative for chest pain and leg swelling.  Gastrointestinal: Negative for abdominal pain, constipation, diarrhea, nausea and vomiting.  Genitourinary: Negative for bladder incontinence, difficulty urinating, dysuria, frequency and hematuria.   Musculoskeletal: Negative for back pain, gait problem, neck pain and neck stiffness.  Skin: Negative for itching and rash.  Neurological: Negative for dizziness, extremity weakness, gait problem, headaches, light-headedness and seizures.  Hematological: Negative for adenopathy. Does not bruise/bleed easily.  Psychiatric/Behavioral: Negative for  confusion, depression and sleep disturbance. The patient is not nervous/anxious.     PHYSICAL EXAMINATION:  Blood pressure 108/68, pulse 75, temperature 98.1 F (36.7 C), resp. rate 14, height '5\' 11"'  (1.803 m), weight 186 lb (84.4 kg), SpO2 98 %.  ECOG PERFORMANCE STATUS: 1 - Symptomatic but completely ambulatory  Physical Exam  Constitutional: Oriented to person, place, and time and well-developed, well-nourished, and in no distress.  HENT:  Head: Normocephalic and atraumatic.  Mouth/Throat: Oropharynx is clear and moist. No oropharyngeal exudate.  Eyes: Conjunctivae are normal. Right eye exhibits no discharge. Left eye exhibits no discharge. No scleral icterus.  Neck: Normal range of motion. Neck supple.  Cardiovascular: Normal rate, regular rhythm, normal heart sounds and intact distal pulses.   Pulmonary/Chest: Effort normal and breath sounds normal. Quiet breath sounds in all lung fields.No wheezes. No rales.  Abdominal: Soft. Bowel sounds are normal. Exhibits no distension and no mass. There is no tenderness.  Musculoskeletal: Normal range of motion. Exhibits no edema.  Lymphadenopathy:    No cervical adenopathy.  Neurological: Alert and oriented to person, place, and time. Exhibits normal muscle tone. Gait normal. Coordination normal. Uses a cane for ambulation.  Skin: Skin is warm and dry. No rash noted. Not diaphoretic. No erythema. No pallor.  Psychiatric: Mood, memory and judgment normal.  Vitals reviewed.  LABORATORY DATA: Lab Results  Component Value Date   WBC 4.9 01/01/2020   HGB 14.6 01/01/2020   HCT 44.0 01/01/2020   MCV 88.4 01/01/2020   PLT 173 01/01/2020      Chemistry      Component Value Date/Time   NA 137 01/01/2020 1049   NA 143 03/10/2017 0911   K 4.1 01/01/2020 1049   K 4.7 03/10/2017 0911   CL 104 01/01/2020 1049   CO2 25 01/01/2020 1049   CO2 30 (H) 03/10/2017 0911   BUN 8 01/01/2020 1049   BUN 10.7 03/10/2017 0911   CREATININE 0.70  01/01/2020 1049   CREATININE 0.8 03/10/2017 0911      Component Value Date/Time   CALCIUM 10.2 01/01/2020 1049   CALCIUM 9.4 03/10/2017 0911   ALKPHOS 74 01/01/2020 1049   ALKPHOS 76 03/10/2017 0911   AST 23 01/01/2020 1049   AST 34 03/10/2017 0911   ALT 24 01/01/2020 1049   ALT 45 03/10/2017 0911   BILITOT 1.4 (H) 01/01/2020 1049   BILITOT 1.41 (H) 03/10/2017 0911       RADIOGRAPHIC STUDIES:  No results found.   ASSESSMENT/PLAN:  This is a very pleasant 74 year old Caucasian male referred to the clinic for evaluation of stage IIIb non-small cell lung cancer, squamous cell carcinoma.  The patient presented with a left upper lobe lung mass as well as associated mediastinal lymphadenopathy and contralateral right hilar mild hypermetabolism. There was heterogeneous marrow uptake in general in the spine but with more focal areas on the L4 level without imaging correlation on CT. His PDL1 expression is 2%.He was diagnosed in August 2021.  The patient is currently undergoing concurrent chemoradiation with carboplatin for an AUC of 2 and paclitaxel 45 mg per metered squared.  The patient is status post 3 cycles and tolerated well without any adverse side effects.  The last day of radiation is scheduled for November 1st, 2021 under the care of Dr. Sondra Come.  Labs were reviewed.  Recommend that he proceed with cycle #4 today scheduled.  We will see him back for follow-up visit in 2 weeks for evaluation before starting cycle #6.  We will arrange for the patient to have his COVID 19 booster on 01/09/20.   Discussed the benadryl with the patient and his side effects. He would like to keep the benadryl as part of his treatment plan for now at the same dose.   The patient was advised to call immediately if he has any concerning symptoms in the interval. The patient voices understanding of current disease status and treatment options and is in agreement with the current care plan. All  questions were answered. The patient knows to call the clinic with any problems, questions or concerns. We can certainly see the patient much sooner if necessary       No orders of the defined types were placed in this encounter.    Kailiana Granquist L Chardai Gangemi, PA-C 01/01/20

## 2019-12-28 NOTE — Telephone Encounter (Signed)
Spoke with patient. His chemo treatment was rescheduled to the day before his OV and PFT. He wanted to make sure that he would be ok to proceed with the PFT. I advised him that it should be ok as long as he feels well after his treatment. He verbalized understanding.   Nothing further needed at time of call.

## 2019-12-31 ENCOUNTER — Ambulatory Visit
Admission: RE | Admit: 2019-12-31 | Discharge: 2019-12-31 | Disposition: A | Discharge status: Home / Self Care | Payer: Medicare Other | Source: Ambulatory Visit | Attending: Radiation Oncology | Admitting: Radiation Oncology

## 2019-12-31 DIAGNOSIS — C3412 Malignant neoplasm of upper lobe, left bronchus or lung: Secondary | ICD-10-CM | POA: Diagnosis not present

## 2019-12-31 DIAGNOSIS — Z510 Encounter for antineoplastic radiation therapy: Secondary | ICD-10-CM | POA: Diagnosis not present

## 2020-01-01 ENCOUNTER — Other Ambulatory Visit: Payer: Self-pay

## 2020-01-01 ENCOUNTER — Inpatient Hospital Stay: Payer: Medicare Other

## 2020-01-01 ENCOUNTER — Other Ambulatory Visit: Payer: Self-pay | Admitting: *Deleted

## 2020-01-01 ENCOUNTER — Ambulatory Visit
Admission: RE | Admit: 2020-01-01 | Discharge: 2020-01-01 | Disposition: A | Discharge status: Home / Self Care | Payer: Medicare Other | Source: Ambulatory Visit | Attending: Radiation Oncology | Admitting: Radiation Oncology

## 2020-01-01 ENCOUNTER — Encounter: Payer: Self-pay | Admitting: Physician Assistant

## 2020-01-01 ENCOUNTER — Inpatient Hospital Stay (HOSPITAL_BASED_OUTPATIENT_CLINIC_OR_DEPARTMENT_OTHER): Payer: Medicare Other | Admitting: Physician Assistant

## 2020-01-01 ENCOUNTER — Inpatient Hospital Stay: Payer: Medicare Other | Admitting: Internal Medicine

## 2020-01-01 VITALS — BP 108/68 | HR 75 | Temp 98.1°F | Resp 14 | Ht 71.0 in | Wt 186.0 lb

## 2020-01-01 DIAGNOSIS — C3492 Malignant neoplasm of unspecified part of left bronchus or lung: Secondary | ICD-10-CM | POA: Diagnosis not present

## 2020-01-01 DIAGNOSIS — Z23 Encounter for immunization: Secondary | ICD-10-CM | POA: Diagnosis not present

## 2020-01-01 DIAGNOSIS — R634 Abnormal weight loss: Secondary | ICD-10-CM | POA: Diagnosis not present

## 2020-01-01 DIAGNOSIS — Z5111 Encounter for antineoplastic chemotherapy: Secondary | ICD-10-CM

## 2020-01-01 DIAGNOSIS — C3412 Malignant neoplasm of upper lobe, left bronchus or lung: Secondary | ICD-10-CM | POA: Diagnosis not present

## 2020-01-01 DIAGNOSIS — C9110 Chronic lymphocytic leukemia of B-cell type not having achieved remission: Secondary | ICD-10-CM

## 2020-01-01 DIAGNOSIS — J449 Chronic obstructive pulmonary disease, unspecified: Secondary | ICD-10-CM

## 2020-01-01 DIAGNOSIS — Z510 Encounter for antineoplastic radiation therapy: Secondary | ICD-10-CM | POA: Diagnosis not present

## 2020-01-01 LAB — CBC WITH DIFFERENTIAL (CANCER CENTER ONLY)
Abs Immature Granulocytes: 0.02 10*3/uL (ref 0.00–0.07)
Basophils Absolute: 0 10*3/uL (ref 0.0–0.1)
Basophils Relative: 1 %
Eosinophils Absolute: 0 10*3/uL (ref 0.0–0.5)
Eosinophils Relative: 0 %
HCT: 44 % (ref 39.0–52.0)
Hemoglobin: 14.6 g/dL (ref 13.0–17.0)
Immature Granulocytes: 0 %
Lymphocytes Relative: 31 %
Lymphs Abs: 1.6 10*3/uL (ref 0.7–4.0)
MCH: 29.3 pg (ref 26.0–34.0)
MCHC: 33.2 g/dL (ref 30.0–36.0)
MCV: 88.4 fL (ref 80.0–100.0)
Monocytes Absolute: 0.6 10*3/uL (ref 0.1–1.0)
Monocytes Relative: 12 %
Neutro Abs: 2.7 10*3/uL (ref 1.7–7.7)
Neutrophils Relative %: 56 %
Platelet Count: 173 10*3/uL (ref 150–400)
RBC: 4.98 MIL/uL (ref 4.22–5.81)
RDW: 13.7 % (ref 11.5–15.5)
WBC Count: 4.9 10*3/uL (ref 4.0–10.5)
nRBC: 0 % (ref 0.0–0.2)

## 2020-01-01 LAB — CMP (CANCER CENTER ONLY)
ALT: 24 U/L (ref 0–44)
AST: 23 U/L (ref 15–41)
Albumin: 3.8 g/dL (ref 3.5–5.0)
Alkaline Phosphatase: 74 U/L (ref 38–126)
Anion gap: 8 (ref 5–15)
BUN: 8 mg/dL (ref 8–23)
CO2: 25 mmol/L (ref 22–32)
Calcium: 10.2 mg/dL (ref 8.9–10.3)
Chloride: 104 mmol/L (ref 98–111)
Creatinine: 0.7 mg/dL (ref 0.61–1.24)
GFR, Estimated: >60 mL/min (ref >60)
Glucose, Bld: 88 mg/dL (ref 70–99)
Potassium: 4.1 mmol/L (ref 3.5–5.1)
Sodium: 137 mmol/L (ref 135–145)
Total Bilirubin: 1.4 mg/dL — ABNORMAL HIGH (ref 0.3–1.2)
Total Protein: 7.4 g/dL (ref 6.5–8.1)

## 2020-01-01 MED ORDER — SODIUM CHLORIDE 0.9 % IV SOLN
Freq: Once | INTRAVENOUS | Status: AC
  Filled 2020-01-01: qty 250

## 2020-01-01 MED ORDER — PALONOSETRON HCL INJECTION 0.25 MG/5ML
0.2500 mg | Freq: Once | INTRAVENOUS | Status: AC
  Administered 2020-01-01: 0.25 mg via INTRAVENOUS

## 2020-01-01 MED ORDER — FAMOTIDINE IN NACL 20-0.9 MG/50ML-% IV SOLN
20.0000 mg | Freq: Once | INTRAVENOUS | Status: AC
  Administered 2020-01-01: 20 mg via INTRAVENOUS

## 2020-01-01 MED ORDER — SODIUM CHLORIDE 0.9 % IV SOLN
212.0000 mg | Freq: Once | INTRAVENOUS | Status: AC
  Administered 2020-01-01: 210 mg via INTRAVENOUS
  Filled 2020-01-01: qty 21

## 2020-01-01 MED ORDER — SODIUM CHLORIDE 0.9 % IV SOLN
45.0000 mg/m2 | Freq: Once | INTRAVENOUS | Status: AC
  Administered 2020-01-01: 96 mg via INTRAVENOUS
  Filled 2020-01-01: qty 16

## 2020-01-01 MED ORDER — PALONOSETRON HCL INJECTION 0.25 MG/5ML
INTRAVENOUS | Status: AC
  Filled 2020-01-01: qty 5

## 2020-01-01 MED ORDER — DIPHENHYDRAMINE HCL 50 MG/ML IJ SOLN
50.0000 mg | Freq: Once | INTRAMUSCULAR | Status: AC
  Administered 2020-01-01: 50 mg via INTRAVENOUS

## 2020-01-01 MED ORDER — SODIUM CHLORIDE 0.9 % IV SOLN
20.0000 mg | Freq: Once | INTRAVENOUS | Status: AC
  Administered 2020-01-01: 20 mg via INTRAVENOUS
  Filled 2020-01-01: qty 20

## 2020-01-01 MED ORDER — DIPHENHYDRAMINE HCL 50 MG/ML IJ SOLN
INTRAMUSCULAR | Status: AC
  Filled 2020-01-01: qty 1

## 2020-01-01 MED ORDER — FAMOTIDINE IN NACL 20-0.9 MG/50ML-% IV SOLN
INTRAVENOUS | Status: AC
  Filled 2020-01-01: qty 50

## 2020-01-01 NOTE — Patient Instructions (Signed)
[facility] Discharge Instructions for Patients Receiving Chemotherapy  Today you received the following chemotherapy agents Paclitaxel (TAXOL) & Carboplatin (PARAPLATIN).  To help prevent nausea and vomiting after your treatment, we encourage you to take your nausea medication as prescribed.  If you develop nausea and vomiting that is not controlled by your nausea medication, call the clinic.   BELOW ARE SYMPTOMS THAT SHOULD BE REPORTED IMMEDIATELY:  *FEVER GREATER THAN 100.5 F  *CHILLS WITH OR WITHOUT FEVER  NAUSEA AND VOMITING THAT IS NOT CONTROLLED WITH YOUR NAUSEA MEDICATION  *UNUSUAL SHORTNESS OF BREATH  *UNUSUAL BRUISING OR BLEEDING  TENDERNESS IN MOUTH AND THROAT WITH OR WITHOUT PRESENCE OF ULCERS  *URINARY PROBLEMS  *BOWEL PROBLEMS  UNUSUAL RASH Items with * indicate a potential emergency and should be followed up as soon as possible.  Feel free to call the clinic should you have any questions or concerns. The clinic phone number is [phone].  Please show the CHEMO ALERT CARD at check-in to the Emergency Department and triage nurse.   

## 2020-01-02 ENCOUNTER — Ambulatory Visit
Admission: RE | Admit: 2020-01-02 | Discharge: 2020-01-02 | Disposition: A | Discharge status: Home / Self Care | Payer: Medicare Other | Source: Ambulatory Visit | Attending: Radiation Oncology | Admitting: Radiation Oncology

## 2020-01-02 ENCOUNTER — Encounter: Payer: Self-pay | Admitting: Emergency Medicine

## 2020-01-02 ENCOUNTER — Ambulatory Visit (INDEPENDENT_AMBULATORY_CARE_PROVIDER_SITE_OTHER): Payer: Medicare Other | Admitting: Emergency Medicine

## 2020-01-02 DIAGNOSIS — Z510 Encounter for antineoplastic radiation therapy: Secondary | ICD-10-CM | POA: Diagnosis not present

## 2020-01-02 DIAGNOSIS — C3492 Malignant neoplasm of unspecified part of left bronchus or lung: Secondary | ICD-10-CM

## 2020-01-02 DIAGNOSIS — J449 Chronic obstructive pulmonary disease, unspecified: Secondary | ICD-10-CM | POA: Diagnosis not present

## 2020-01-02 DIAGNOSIS — C3412 Malignant neoplasm of upper lobe, left bronchus or lung: Secondary | ICD-10-CM | POA: Diagnosis not present

## 2020-01-02 LAB — PULMONARY FUNCTION TEST
DL/VA % pred: 84 %
DL/VA: 3.35 ml/min/mmHg/L
DLCO cor % pred: 63 %
DLCO cor: 16.56 ml/min/mmHg
DLCO unc % pred: 63 %
DLCO unc: 16.7 ml/min/mmHg
FEF 25-75 Post: 1.02 L/sec
FEF 25-75 Pre: 0.64 L/sec
FEF2575-%Change-Post: 58 %
FEF2575-%Pred-Post: 42 %
FEF2575-%Pred-Pre: 26 %
FEV1-%Change-Post: 17 %
FEV1-%Pred-Post: 50 %
FEV1-%Pred-Pre: 43 %
FEV1-Post: 1.66 L
FEV1-Pre: 1.41 L
FEV1FVC-%Change-Post: -1 %
FEV1FVC-%Pred-Pre: 74 %
FEV6-%Change-Post: 19 %
FEV6-%Pred-Post: 72 %
FEV6-%Pred-Pre: 60 %
FEV6-Post: 3.07 L
FEV6-Pre: 2.57 L
FEV6FVC-%Change-Post: 0 %
FEV6FVC-%Pred-Post: 104 %
FEV6FVC-%Pred-Pre: 104 %
FVC-%Change-Post: 19 %
FVC-%Pred-Post: 69 %
FVC-%Pred-Pre: 58 %
FVC-Post: 3.13 L
FVC-Pre: 2.61 L
Post FEV1/FVC ratio: 53 %
Post FEV6/FVC ratio: 98 %
Pre FEV1/FVC ratio: 54 %
Pre FEV6/FVC Ratio: 98 %
RV % pred: 153 %
RV: 3.93 L
TLC % pred: 94 %
TLC: 6.87 L

## 2020-01-02 NOTE — Progress Notes (Signed)
Full PFT performed today. °

## 2020-01-02 NOTE — Progress Notes (Signed)
   Subjective:    Patient ID: Joe [name], male    DOB: 1945/04/08, 74 y.o.   MRN: [id number]  HPI 74 year old former smoker (76 pk-yrs) with a history of COPD, A Fib on Eliquis, stage I CLL (2016), hypertension, GERD, hypothyroidism, OSA on CPAP.  Not currently on bronchodilator therapy He he had increased dyspnea, viral syndrome beginning in April, lasted over 6 weeks, was COVID negative.  Was treated with azithro and steroids in early June.  A chest x-ray done 08/23/2019 reviewed by me, showed a left upper lobe masslike lesion.  This prompted a CT chest 09/07/2019 which I reviewed. and which shows a large centrally necrotic 6.2 cm left upper lobe/hilar mass.  There are some small associated hilar mediastinal enlarged lymph nodes.   Breathing is better, no CP. His exertional tolerance is a bit limited - has some difficulty shopping, carrying groceries. He has seen exertional desats before. No wheeze, no chest tightness. Never coughs.   ROV 01/02/20 --[name] is a 74 year old former smoker (40 pack years) with COPD.  He also has A. fib, stage I CLL, hypertension, OSA on CPAP.  I saw him for an abnormal CT scan of the chest in July/2021, left upper lobe mass based on lymphadenopathy.  He had an initial bronchoscopy 10/23/2019 which was nondiagnostic.  I took him back for repeat bronchoscopy on 11/20/2019 and we diagnosed him with squamous cell carcinoma.  He is undergoing chemoradiation with Drs Julien Nordmann and Sondra Come. He is feeling better  - more energy. Breathing is doing ok. Not currently on any BD's. Repeat imaging pending. He will probably start immunotherapy for a year after this regimen.    Review of Systems As per HPI      Objective:   Physical Exam Vitals:   01/02/20 1501  BP: 124/72  Pulse: 79  Temp: (!) 97.5 F (36.4 C)  TempSrc: Temporal  SpO2: 97%  Weight: 187 lb (84.8 kg)  Height: 5\' 11"  (1.803 m)   Gen: Pleasant, elderly man, in no distress,  normal affect  ENT: No  lesions,  mouth clear,  oropharynx clear, no postnasal drip  Neck: No JVD, no stridor  Lungs: No use of accessory muscles, distant, no wheeze, few left-sided inspiratory squeaks  Cardiovascular: RRR, heart sounds normal, no murmur or gallops, no peripheral edema  Musculoskeletal: No deformities, no cyanosis or clubbing  Neuro: alert, awake, non focal  Skin: Warm, no lesions or rash      Assessment & Plan:  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 Repeat imaging as per their plans.   COPD mixed type ([place]) COVID and flu shots up to date We will not start any inhaled medications at this time.  Follow with Dr Lamonte Sakai in 6 months or sooner if you have any problems  Baltazar Apo, MD, PhD 12/14/2020, 11:04 AM [facility] [phone] or if no answer [phone]

## 2020-01-02 NOTE — Patient Instructions (Signed)
Continue to follow with Drs Julien Nordmann and Sondra Come. Repeat imaging as per their plans.  COVID and flu shots up to date We will not start any inhaled medications at this time.  Follow with Dr Lamonte Sakai in 6 months or sooner if you have any problems

## 2020-01-03 ENCOUNTER — Other Ambulatory Visit: Payer: Self-pay

## 2020-01-03 ENCOUNTER — Ambulatory Visit
Admission: RE | Admit: 2020-01-03 | Discharge: 2020-01-03 | Disposition: A | Discharge status: Home / Self Care | Payer: Medicare Other | Source: Ambulatory Visit | Attending: Radiation Oncology | Admitting: Radiation Oncology

## 2020-01-03 DIAGNOSIS — C3412 Malignant neoplasm of upper lobe, left bronchus or lung: Secondary | ICD-10-CM | POA: Diagnosis not present

## 2020-01-03 DIAGNOSIS — Z510 Encounter for antineoplastic radiation therapy: Secondary | ICD-10-CM | POA: Diagnosis not present

## 2020-01-04 ENCOUNTER — Ambulatory Visit
Admission: RE | Admit: 2020-01-04 | Discharge: 2020-01-04 | Disposition: A | Discharge status: Home / Self Care | Payer: Medicare Other | Source: Ambulatory Visit | Attending: Radiation Oncology | Admitting: Radiation Oncology

## 2020-01-04 DIAGNOSIS — Z510 Encounter for antineoplastic radiation therapy: Secondary | ICD-10-CM | POA: Diagnosis not present

## 2020-01-04 DIAGNOSIS — C3412 Malignant neoplasm of upper lobe, left bronchus or lung: Secondary | ICD-10-CM | POA: Diagnosis not present

## 2020-01-04 MED FILL — Dexamethasone Sodium Phosphate Inj 100 MG/10ML: INTRAMUSCULAR | Qty: 2 | Status: AC

## 2020-01-07 ENCOUNTER — Other Ambulatory Visit: Payer: Self-pay

## 2020-01-07 ENCOUNTER — Ambulatory Visit
Admission: RE | Admit: 2020-01-07 | Discharge: 2020-01-07 | Disposition: A | Discharge status: Home / Self Care | Payer: Medicare Other | Source: Ambulatory Visit | Attending: Radiation Oncology | Admitting: Radiation Oncology

## 2020-01-07 ENCOUNTER — Inpatient Hospital Stay: Payer: Medicare Other

## 2020-01-07 ENCOUNTER — Encounter: Payer: Self-pay | Admitting: *Deleted

## 2020-01-07 DIAGNOSIS — Z5111 Encounter for antineoplastic chemotherapy: Secondary | ICD-10-CM | POA: Diagnosis not present

## 2020-01-07 DIAGNOSIS — C3492 Malignant neoplasm of unspecified part of left bronchus or lung: Secondary | ICD-10-CM

## 2020-01-07 DIAGNOSIS — Z23 Encounter for immunization: Secondary | ICD-10-CM | POA: Diagnosis not present

## 2020-01-07 DIAGNOSIS — C3412 Malignant neoplasm of upper lobe, left bronchus or lung: Secondary | ICD-10-CM | POA: Diagnosis not present

## 2020-01-07 DIAGNOSIS — Z510 Encounter for antineoplastic radiation therapy: Secondary | ICD-10-CM | POA: Diagnosis not present

## 2020-01-07 DIAGNOSIS — C9110 Chronic lymphocytic leukemia of B-cell type not having achieved remission: Secondary | ICD-10-CM | POA: Diagnosis not present

## 2020-01-07 DIAGNOSIS — J449 Chronic obstructive pulmonary disease, unspecified: Secondary | ICD-10-CM | POA: Diagnosis not present

## 2020-01-07 DIAGNOSIS — R634 Abnormal weight loss: Secondary | ICD-10-CM | POA: Diagnosis not present

## 2020-01-07 LAB — CMP (CANCER CENTER ONLY)
ALT: 25 U/L (ref 0–44)
AST: 20 U/L (ref 15–41)
Albumin: 3.8 g/dL (ref 3.5–5.0)
Alkaline Phosphatase: 72 U/L (ref 38–126)
Anion gap: 6 (ref 5–15)
BUN: 10 mg/dL (ref 8–23)
CO2: 30 mmol/L (ref 22–32)
Calcium: 9.8 mg/dL (ref 8.9–10.3)
Chloride: 102 mmol/L (ref 98–111)
Creatinine: 0.73 mg/dL (ref 0.61–1.24)
GFR, Estimated: >60 mL/min (ref >60)
Glucose, Bld: 105 mg/dL — ABNORMAL HIGH (ref 70–99)
Potassium: 4.4 mmol/L (ref 3.5–5.1)
Sodium: 138 mmol/L (ref 135–145)
Total Bilirubin: 2.8 mg/dL — ABNORMAL HIGH (ref 0.3–1.2)
Total Protein: 7.4 g/dL (ref 6.5–8.1)

## 2020-01-07 LAB — CBC WITH DIFFERENTIAL (CANCER CENTER ONLY)
Abs Immature Granulocytes: 0.01 10*3/uL (ref 0.00–0.07)
Basophils Absolute: 0 10*3/uL (ref 0.0–0.1)
Basophils Relative: 1 %
Eosinophils Absolute: 0 10*3/uL (ref 0.0–0.5)
Eosinophils Relative: 0 %
HCT: 45.4 % (ref 39.0–52.0)
Hemoglobin: 14.9 g/dL (ref 13.0–17.0)
Immature Granulocytes: 0 %
Lymphocytes Relative: 21 %
Lymphs Abs: 0.9 10*3/uL (ref 0.7–4.0)
MCH: 29.3 pg (ref 26.0–34.0)
MCHC: 32.8 g/dL (ref 30.0–36.0)
MCV: 89.2 fL (ref 80.0–100.0)
Monocytes Absolute: 0.4 10*3/uL (ref 0.1–1.0)
Monocytes Relative: 9 %
Neutro Abs: 2.9 10*3/uL (ref 1.7–7.7)
Neutrophils Relative %: 69 %
Platelet Count: 89 10*3/uL — ABNORMAL LOW (ref 150–400)
RBC: 5.09 MIL/uL (ref 4.22–5.81)
RDW: 13.9 % (ref 11.5–15.5)
WBC Count: 4.3 10*3/uL (ref 4.0–10.5)
nRBC: 0 % (ref 0.0–0.2)

## 2020-01-08 ENCOUNTER — Ambulatory Visit
Admission: RE | Admit: 2020-01-08 | Discharge: 2020-01-08 | Disposition: A | Discharge status: Home / Self Care | Payer: Medicare Other | Source: Ambulatory Visit | Attending: Radiation Oncology | Admitting: Radiation Oncology

## 2020-01-08 ENCOUNTER — Other Ambulatory Visit: Payer: Self-pay

## 2020-01-08 DIAGNOSIS — C3412 Malignant neoplasm of upper lobe, left bronchus or lung: Secondary | ICD-10-CM | POA: Diagnosis not present

## 2020-01-08 DIAGNOSIS — Z510 Encounter for antineoplastic radiation therapy: Secondary | ICD-10-CM | POA: Diagnosis not present

## 2020-01-09 ENCOUNTER — Ambulatory Visit
Admission: RE | Admit: 2020-01-09 | Discharge: 2020-01-09 | Disposition: A | Discharge status: Home / Self Care | Payer: Medicare Other | Source: Ambulatory Visit | Attending: Radiation Oncology | Admitting: Radiation Oncology

## 2020-01-09 ENCOUNTER — Inpatient Hospital Stay: Payer: Medicare Other

## 2020-01-09 ENCOUNTER — Encounter: Payer: Self-pay | Admitting: *Deleted

## 2020-01-09 ENCOUNTER — Other Ambulatory Visit: Payer: Self-pay

## 2020-01-09 DIAGNOSIS — Z5111 Encounter for antineoplastic chemotherapy: Secondary | ICD-10-CM | POA: Diagnosis not present

## 2020-01-09 DIAGNOSIS — C3412 Malignant neoplasm of upper lobe, left bronchus or lung: Secondary | ICD-10-CM | POA: Diagnosis not present

## 2020-01-09 DIAGNOSIS — Z23 Encounter for immunization: Secondary | ICD-10-CM | POA: Diagnosis not present

## 2020-01-09 DIAGNOSIS — R634 Abnormal weight loss: Secondary | ICD-10-CM | POA: Diagnosis not present

## 2020-01-09 DIAGNOSIS — Z510 Encounter for antineoplastic radiation therapy: Secondary | ICD-10-CM | POA: Diagnosis not present

## 2020-01-09 DIAGNOSIS — J449 Chronic obstructive pulmonary disease, unspecified: Secondary | ICD-10-CM | POA: Diagnosis not present

## 2020-01-09 DIAGNOSIS — C9110 Chronic lymphocytic leukemia of B-cell type not having achieved remission: Secondary | ICD-10-CM | POA: Diagnosis not present

## 2020-01-09 NOTE — Progress Notes (Signed)
Oncology Nurse Navigator Documentation  Oncology Nurse Navigator Flowsheets 01/09/2020  Abnormal Finding Date -  Confirmed Diagnosis Date 11/20/2019  Diagnosis Status Confirmed Diagnosis Complete  Planned Course of Treatment Chemo/Radiation Concurrent  Phase of Treatment Radiation  Chemotherapy Actual Start Date: 12/10/2019  Radiation Actual Start Date: 12/11/2019  Navigator Follow Up Date: 01/09/2020  Navigator Follow Up Reason: Appointment Review;Review Note  Navigation Complete Date: 01/09/2020  Post Navigation: Continue to Follow Patient? No  Reason Not Navigating Patient: Other:  Primary school teacher [employer]  Navigator Encounter Type Other:  Treatment Initiated Date 12/10/2019  Treatment Phase Treatment  Barriers/Navigation Needs Coordination of Care  Interventions Coordination of Care  Acuity Level 2-Minimal Needs (1-2 Barriers Identified)  Coordination of Care Other  Time Spent with Patient 30

## 2020-01-10 ENCOUNTER — Other Ambulatory Visit: Payer: Self-pay

## 2020-01-10 ENCOUNTER — Ambulatory Visit
Admission: RE | Admit: 2020-01-10 | Discharge: 2020-01-10 | Disposition: A | Discharge status: Home / Self Care | Payer: Medicare Other | Source: Ambulatory Visit | Attending: Radiation Oncology | Admitting: Radiation Oncology

## 2020-01-10 DIAGNOSIS — C3412 Malignant neoplasm of upper lobe, left bronchus or lung: Secondary | ICD-10-CM | POA: Diagnosis not present

## 2020-01-10 DIAGNOSIS — Z510 Encounter for antineoplastic radiation therapy: Secondary | ICD-10-CM | POA: Diagnosis not present

## 2020-01-11 ENCOUNTER — Other Ambulatory Visit: Payer: Self-pay

## 2020-01-11 ENCOUNTER — Ambulatory Visit
Admission: RE | Admit: 2020-01-11 | Discharge: 2020-01-11 | Disposition: A | Discharge status: Home / Self Care | Payer: Medicare Other | Source: Ambulatory Visit | Attending: Radiation Oncology | Admitting: Radiation Oncology

## 2020-01-11 DIAGNOSIS — C3412 Malignant neoplasm of upper lobe, left bronchus or lung: Secondary | ICD-10-CM | POA: Diagnosis not present

## 2020-01-11 DIAGNOSIS — Z510 Encounter for antineoplastic radiation therapy: Secondary | ICD-10-CM | POA: Diagnosis not present

## 2020-01-14 ENCOUNTER — Inpatient Hospital Stay: Payer: Medicare Other

## 2020-01-14 ENCOUNTER — Inpatient Hospital Stay (HOSPITAL_BASED_OUTPATIENT_CLINIC_OR_DEPARTMENT_OTHER): Payer: Medicare Other | Admitting: Internal Medicine

## 2020-01-14 ENCOUNTER — Other Ambulatory Visit: Payer: Self-pay

## 2020-01-14 ENCOUNTER — Encounter: Payer: Self-pay | Admitting: Internal Medicine

## 2020-01-14 ENCOUNTER — Ambulatory Visit
Admission: RE | Admit: 2020-01-14 | Discharge: 2020-01-14 | Disposition: A | Discharge status: Home / Self Care | Payer: Medicare Other | Source: Ambulatory Visit | Attending: Radiation Oncology | Admitting: Radiation Oncology

## 2020-01-14 VITALS — BP 129/72 | HR 81 | Temp 97.3°F | Resp 17 | Ht 71.0 in | Wt 179.2 lb

## 2020-01-14 DIAGNOSIS — C3490 Malignant neoplasm of unspecified part of unspecified bronchus or lung: Secondary | ICD-10-CM

## 2020-01-14 DIAGNOSIS — Z5111 Encounter for antineoplastic chemotherapy: Secondary | ICD-10-CM

## 2020-01-14 DIAGNOSIS — C3492 Malignant neoplasm of unspecified part of left bronchus or lung: Secondary | ICD-10-CM | POA: Diagnosis not present

## 2020-01-14 DIAGNOSIS — C3412 Malignant neoplasm of upper lobe, left bronchus or lung: Secondary | ICD-10-CM | POA: Diagnosis not present

## 2020-01-14 DIAGNOSIS — J449 Chronic obstructive pulmonary disease, unspecified: Secondary | ICD-10-CM | POA: Diagnosis not present

## 2020-01-14 DIAGNOSIS — C9110 Chronic lymphocytic leukemia of B-cell type not having achieved remission: Secondary | ICD-10-CM | POA: Diagnosis not present

## 2020-01-14 DIAGNOSIS — R634 Abnormal weight loss: Secondary | ICD-10-CM | POA: Diagnosis not present

## 2020-01-14 DIAGNOSIS — Z23 Encounter for immunization: Secondary | ICD-10-CM | POA: Diagnosis not present

## 2020-01-14 DIAGNOSIS — Z510 Encounter for antineoplastic radiation therapy: Secondary | ICD-10-CM | POA: Diagnosis not present

## 2020-01-14 LAB — CBC WITH DIFFERENTIAL (CANCER CENTER ONLY)
Abs Immature Granulocytes: 0.01 10*3/uL (ref 0.00–0.07)
Basophils Absolute: 0 10*3/uL (ref 0.0–0.1)
Basophils Relative: 0 %
Eosinophils Absolute: 0 10*3/uL (ref 0.0–0.5)
Eosinophils Relative: 0 %
HCT: 42.4 % (ref 39.0–52.0)
Hemoglobin: 14.2 g/dL (ref 13.0–17.0)
Immature Granulocytes: 0 %
Lymphocytes Relative: 18 %
Lymphs Abs: 0.8 10*3/uL (ref 0.7–4.0)
MCH: 29.9 pg (ref 26.0–34.0)
MCHC: 33.5 g/dL (ref 30.0–36.0)
MCV: 89.3 fL (ref 80.0–100.0)
Monocytes Absolute: 0.8 10*3/uL (ref 0.1–1.0)
Monocytes Relative: 18 %
Neutro Abs: 2.9 10*3/uL (ref 1.7–7.7)
Neutrophils Relative %: 64 %
Platelet Count: 105 10*3/uL — ABNORMAL LOW (ref 150–400)
RBC: 4.75 MIL/uL (ref 4.22–5.81)
RDW: 14.8 % (ref 11.5–15.5)
WBC Count: 4.6 10*3/uL (ref 4.0–10.5)
nRBC: 0 % (ref 0.0–0.2)

## 2020-01-14 LAB — CMP (CANCER CENTER ONLY)
ALT: 22 U/L (ref 0–44)
AST: 19 U/L (ref 15–41)
Albumin: 3.8 g/dL (ref 3.5–5.0)
Alkaline Phosphatase: 74 U/L (ref 38–126)
Anion gap: 10 (ref 5–15)
BUN: 9 mg/dL (ref 8–23)
CO2: 24 mmol/L (ref 22–32)
Calcium: 10.1 mg/dL (ref 8.9–10.3)
Chloride: 102 mmol/L (ref 98–111)
Creatinine: 0.73 mg/dL (ref 0.61–1.24)
GFR, Estimated: >60 mL/min (ref >60)
Glucose, Bld: 103 mg/dL — ABNORMAL HIGH (ref 70–99)
Potassium: 4.1 mmol/L (ref 3.5–5.1)
Sodium: 136 mmol/L (ref 135–145)
Total Bilirubin: 1.8 mg/dL — ABNORMAL HIGH (ref 0.3–1.2)
Total Protein: 7.3 g/dL (ref 6.5–8.1)

## 2020-01-14 MED ORDER — FAMOTIDINE IN NACL 20-0.9 MG/50ML-% IV SOLN
20.0000 mg | Freq: Once | INTRAVENOUS | Status: AC
  Administered 2020-01-14: 20 mg via INTRAVENOUS

## 2020-01-14 MED ORDER — FAMOTIDINE IN NACL 20-0.9 MG/50ML-% IV SOLN
INTRAVENOUS | Status: AC
  Filled 2020-01-14: qty 50

## 2020-01-14 MED ORDER — PALONOSETRON HCL INJECTION 0.25 MG/5ML
0.2500 mg | Freq: Once | INTRAVENOUS | Status: AC
  Administered 2020-01-14: 0.25 mg via INTRAVENOUS

## 2020-01-14 MED ORDER — SODIUM CHLORIDE 0.9 % IV SOLN
Freq: Once | INTRAVENOUS | Status: AC
  Filled 2020-01-14: qty 250

## 2020-01-14 MED ORDER — DIPHENHYDRAMINE HCL 50 MG/ML IJ SOLN
INTRAMUSCULAR | Status: AC
  Filled 2020-01-14: qty 1

## 2020-01-14 MED ORDER — SODIUM CHLORIDE 0.9 % IV SOLN
45.0000 mg/m2 | Freq: Once | INTRAVENOUS | Status: AC
  Administered 2020-01-14: 96 mg via INTRAVENOUS
  Filled 2020-01-14: qty 16

## 2020-01-14 MED ORDER — SODIUM CHLORIDE 0.9 % IV SOLN
212.0000 mg | Freq: Once | INTRAVENOUS | Status: AC
  Administered 2020-01-14: 210 mg via INTRAVENOUS
  Filled 2020-01-14: qty 21

## 2020-01-14 MED ORDER — DIPHENHYDRAMINE HCL 50 MG/ML IJ SOLN
50.0000 mg | Freq: Once | INTRAMUSCULAR | Status: AC
  Administered 2020-01-14: 50 mg via INTRAVENOUS

## 2020-01-14 MED ORDER — PALONOSETRON HCL INJECTION 0.25 MG/5ML
INTRAVENOUS | Status: AC
  Filled 2020-01-14: qty 5

## 2020-01-14 MED ORDER — SODIUM CHLORIDE 0.9 % IV SOLN
20.0000 mg | Freq: Once | INTRAVENOUS | Status: AC
  Administered 2020-01-14: 20 mg via INTRAVENOUS
  Filled 2020-01-14: qty 20

## 2020-01-14 NOTE — Patient Instructions (Signed)
[facility] Discharge Instructions for Patients Receiving Chemotherapy  Today you received the following chemotherapy agents Taxol, Carboplatin  To help prevent nausea and vomiting after your treatment, we encourage you to take your nausea medication as directed  If you develop nausea and vomiting that is not controlled by your nausea medication, call the clinic.   BELOW ARE SYMPTOMS THAT SHOULD BE REPORTED IMMEDIATELY:  *FEVER GREATER THAN 100.5 F  *CHILLS WITH OR WITHOUT FEVER  NAUSEA AND VOMITING THAT IS NOT CONTROLLED WITH YOUR NAUSEA MEDICATION  *UNUSUAL SHORTNESS OF BREATH  *UNUSUAL BRUISING OR BLEEDING  TENDERNESS IN MOUTH AND THROAT WITH OR WITHOUT PRESENCE OF ULCERS  *URINARY PROBLEMS  *BOWEL PROBLEMS  UNUSUAL RASH Items with * indicate a potential emergency and should be followed up as soon as possible.  Feel free to call the clinic should you have any questions or concerns. The clinic phone number is [phone].  Please show the CHEMO ALERT CARD at check-in to the Emergency Department and triage nurse.   

## 2020-01-14 NOTE — Progress Notes (Signed)
Ok to treat pt today with Carboplatin ,taxol and bilirubin 1.8.

## 2020-01-14 NOTE — Progress Notes (Signed)
[facility] Telephone:(336) [phone]   Fax:(336) [phone]  OFFICE PROGRESS NOTE  [name], [name], MD [place] [address]  DIAGNOSIS: 1)Stage IIIbnon-small cell lung cancer, squamous cell carcinoma. The patient presented with a left upper lobe lung mass as well as associated mediastinal lymphadenopathy and contralateral right hilar mild hypermetabolism. There was heterogeneous marrow uptake in the spine but with more focal areas at L4 without imaging correlation on CT scan. The patient was diagnosed in August 2021. 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 First dose expected on 12/13/2019. Status post 4 cycles.   INTERVAL HISTORY: Joe [name] 74 y.o. male returns to the clinic today for follow-up visit.  The patient is feeling fine today with no concerning complaints except for intermittent left lower quadrant as well as right upper quadrant abdominal pain which completely resolved.  He denied having any current chest pain, shortness of breath, cough or hemoptysis.  He denied having any fever or chills.  He has no nausea, vomiting, diarrhea or constipation.  He denied having any headache or visual changes.  Is here today for evaluation before starting cycle #5 of his treatment.  MEDICAL HISTORY: Past Medical History:  Diagnosis Date  . Alcohol abuse, daily use 08/24/2010   Stopped 05/10/13   . Arthralgia 12/25/13   9/15 Not related to statins OA   . Cataract   . Chronic lymphocytic leukemia ([place]) 07-18-2014   chronic stage 1- no symtoms  . CLL (chronic lymphocytic leukemia) ([place]) 08/08/2014   05/10/14 Dr Burr Medico Stage 0  . COPD mixed type ([place]) 07/26/2007   Smoker - stopped 6/14   . De Quervain's tenosynovitis, right 12/25/2013   2013-05-10   . Depression    at times  . Dysrhythmia     PAF  . Dysuria 12-25-13   9/15 - poss stricture Urol ref was offered   . Gallstones 11/16/2017   Asymptomatic Pt refused surg ref  . Generalized anxiety disorder 09/07/2012   Chronic   Potential benefits of a long term steroid  use as well as potential risks  and complications were explained to the patient and were aknowledged.     [name] GERD 12/02/2006   Chronic     . Grief 05/26/16   [name] died in 05/10/14  . Gynecomastia 12/25/2013   Benign B 2013/05/10   . Hyperlipidemia   . Hypertension   . Hypothyroidism 12/25/2014   05/10/2014 On Levothyroxine   . Intertrigo 02/02/2012   11/13   . Neoplasm of uncertain behavior of skin 03/12/2013   12/14 R ear, chest   . OSA on CPAP    mild with AHI 9/hr and oxygen desats as low as 75%  . Paresis ([place])    right- s/p cerv decompression  . PERIORBITAL CELLULITIS 02/22/2009   Qualifier: Diagnosis of  By: Diona Browner MD, Amy    . 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  MEDICATIONS:  Current Outpatient Medications  Medication Sig Dispense Refill  . amLODipine (NORVASC) 5 MG tablet Take 1 tablet (5 mg total) by mouth daily. 90 tablet 3  . atenolol (TENORMIN) 25 MG tablet Take 1 tablet (25 mg total) by mouth daily as needed (for palpitations). 90 tablet 3  . Cholecalciferol (EQL VITAMIN  D3) 1000 UNITS tablet Take 1,000 Units by mouth daily.      . diazepam (VALIUM) 5 MG tablet Take 1 tablet (5 mg total) by mouth daily.    [name] ELIQUIS 5 MG TABS tablet TAKE 1 TABLET(5 MG) BY MOUTH TWICE DAILY 180 tablet 1  . levothyroxine (SYNTHROID) 50 MCG tablet Take 1 tablet (50 mcg total) by mouth daily. 90 tablet 3  . lovastatin (MEVACOR) 20 MG tablet Take 1 tablet (20 mg total) by mouth at bedtime. 90 tablet 3  . Multiple Vitamin (MULTIVITAMIN) tablet Take 1 tablet by mouth daily. Centrum Silver.    Vladimir Faster Glycol-Propyl Glycol (SYSTANE) 0.4-0.3 % SOLN Place 1 drop into both eyes daily as needed (Dry eyes). Ultra    . prochlorperazine (COMPAZINE) 10  MG tablet Take 1 tablet (10 mg total) by mouth every 6 (six) hours as needed. 30 tablet 2   No current facility-administered medications for this visit.    SURGICAL HISTORY:  Past Surgical History:  Procedure Laterality Date  . BICEPS TENDON REPAIR    . BRONCHIAL BIOPSY  10/23/2019   Procedure: BRONCHIAL BIOPSIES;  Surgeon: [name], MD;  Location: [facility] ENDOSCOPY;  Service: Pulmonary;;  . BRONCHIAL BIOPSY  11/20/2019   Procedure: BRONCHIAL BIOPSIES;  Surgeon: [name], MD;  Location: [facility] ENDOSCOPY;  Service: Pulmonary;;  . BRONCHIAL BRUSHINGS  10/23/2019   Procedure: BRONCHIAL BRUSHINGS;  Surgeon: [name], MD;  Location: [facility] ENDOSCOPY;  Service: Pulmonary;;  . BRONCHIAL BRUSHINGS  11/20/2019   Procedure: BRONCHIAL BRUSHINGS;  Surgeon: [name], MD;  Location: [facility] ENDOSCOPY;  Service: Pulmonary;;  . BRONCHIAL NEEDLE ASPIRATION BIOPSY  10/23/2019   Procedure: BRONCHIAL NEEDLE ASPIRATION BIOPSIES;  Surgeon: [name], MD;  Location: [facility] ENDOSCOPY;  Service: Pulmonary;;  . BRONCHIAL NEEDLE ASPIRATION BIOPSY  11/20/2019   Procedure: BRONCHIAL NEEDLE ASPIRATION BIOPSIES;  Surgeon: [name], MD;  Location: [place];  Service: Pulmonary;;  . BRONCHIAL WASHINGS  11/20/2019   Procedure: BRONCHIAL WASHINGS;  Surgeon: [name], MD;  Location: [facility] ENDOSCOPY;  Service: Pulmonary;;  . CATARACT EXTRACTION Left   . COLONOSCOPY  04-14-99   Dr Flossie Dibble polyp-TA in epic  . POLYPECTOMY  04-14-99  . POSTERIOR LAMINECTOMY / DECOMPRESSION CERVICAL SPINE     Dr Saintclair Halsted  . RETINAL DETACHMENT SURGERY     left eye, 2007 x2, 2008 x 3  . ROTATOR CUFF REPAIR  2004   right  . TONSILLECTOMY  [id number]  . VIDEO BRONCHOSCOPY WITH ENDOBRONCHIAL NAVIGATION N/A 10/23/2019   Procedure: VIDEO BRONCHOSCOPY WITH ENDOBRONCHIAL NAVIGATION;  Surgeon: [name], MD;  Location: [facility] ENDOSCOPY;  Service: Pulmonary;  Laterality: N/A;  . VIDEO BRONCHOSCOPY WITH ENDOBRONCHIAL NAVIGATION N/A  11/20/2019   Procedure: VIDEO BRONCHOSCOPY WITH ENDOBRONCHIAL NAVIGATION;  Surgeon: [name], MD;  Location: [facility] ENDOSCOPY;  Service: Pulmonary;  Laterality: N/A;  . VIDEO BRONCHOSCOPY WITH ENDOBRONCHIAL ULTRASOUND N/A 10/23/2019   Procedure: VIDEO BRONCHOSCOPY WITH ENDOBRONCHIAL ULTRASOUND;  Surgeon: [name], MD;  Location: [facility] ENDOSCOPY;  Service: Pulmonary;  Laterality: N/A;    REVIEW OF SYSTEMS:  A comprehensive review of systems was negative except for: Constitutional: positive for fatigue Gastrointestinal: positive for abdominal pain   PHYSICAL EXAMINATION: General appearance: alert, cooperative and no distress Head: Normocephalic, without obvious abnormality, atraumatic Neck: no adenopathy, no JVD, supple, symmetrical, trachea midline and thyroid not enlarged, symmetric, no tenderness/mass/nodules Lymph nodes: Cervical, supraclavicular, and axillary nodes normal. Resp: clear to auscultation bilaterally Back: symmetric, no  curvature. ROM normal. No CVA tenderness. Cardio: regular rate and rhythm, S1, S2 normal, no murmur, click, rub or gallop GI: soft, non-tender; bowel sounds normal; no masses,  no organomegaly Extremities: extremities normal, atraumatic, no cyanosis or edema  ECOG PERFORMANCE STATUS: 1 - Symptomatic but completely ambulatory  Blood pressure 129/72, pulse 81, temperature (!) 97.3 F (36.3 C), temperature source Tympanic, resp. rate 17, height _0  (1.803 m), weight 179 lb 3.2 oz (81.3 kg), SpO2 98 %.  LABORATORY DATA: Lab Results  Component Value Date   WBC 4.3 01/07/2020   HGB 14.9 01/07/2020   HCT 45.4 01/07/2020   MCV 89.2 01/07/2020   PLT 89 (L) 01/07/2020      Chemistry      Component Value Date/Time   NA 138 01/07/2020 0841   NA 143 03/10/2017 0911   K 4.4 01/07/2020 0841   K 4.7 03/10/2017 0911   CL 102 01/07/2020 0841   CO2 30 01/07/2020 0841   CO2 30 (H) 03/10/2017 0911   BUN 10 01/07/2020 0841   BUN 10.7 03/10/2017 0911    CREATININE 0.73 01/07/2020 0841   CREATININE 0.8 03/10/2017 0911      Component Value Date/Time   CALCIUM 9.8 01/07/2020 0841   CALCIUM 9.4 03/10/2017 0911   ALKPHOS 72 01/07/2020 0841   ALKPHOS 76 03/10/2017 0911   AST 20 01/07/2020 0841   AST 34 03/10/2017 0911   ALT 25 01/07/2020 0841   ALT 45 03/10/2017 0911   BILITOT 2.8 (H) 01/07/2020 0841   BILITOT 1.41 (H) 03/10/2017 0911       RADIOGRAPHIC STUDIES: No results found.  ASSESSMENT AND PLAN: This is a very pleasant 74 years old white male with a stage IIIb non-small cell lung cancer, squamous cell carcinoma diagnosed in August of 2021.  The patient also has a history of stage 0 CLL.  He is currently undergoing a course of concurrent chemoradiation with weekly carboplatin and paclitaxel status post 4 cycles.  The patient is tolerating his treatment well with no concerning adverse effects. I recommended for him to proceed with cycle #5 today as planned. I will see him back for follow-up visit in 1 months for evaluation with repeat CT scan of the chest for restaging of his disease. The patient was advised to call immediately if he has any concerning symptoms in the interval. The patient voices understanding of current disease status and treatment options and is in agreement with the current care plan.  All questions were answered. The patient knows to call the clinic with any problems, questions or concerns. We can certainly see the patient much sooner if necessary.  Disclaimer: This note was dictated with voice recognition software. Similar sounding words can inadvertently be transcribed and may not be corrected upon review.

## 2020-01-15 ENCOUNTER — Other Ambulatory Visit: Payer: Self-pay

## 2020-01-15 ENCOUNTER — Ambulatory Visit
Admission: RE | Admit: 2020-01-15 | Discharge: 2020-01-15 | Disposition: A | Discharge status: Home / Self Care | Payer: Medicare Other | Source: Ambulatory Visit | Attending: Radiation Oncology | Admitting: Radiation Oncology

## 2020-01-15 DIAGNOSIS — Z510 Encounter for antineoplastic radiation therapy: Secondary | ICD-10-CM | POA: Diagnosis not present

## 2020-01-15 DIAGNOSIS — C3412 Malignant neoplasm of upper lobe, left bronchus or lung: Secondary | ICD-10-CM | POA: Diagnosis not present

## 2020-01-16 ENCOUNTER — Telehealth: Payer: Self-pay | Admitting: Internal Medicine

## 2020-01-16 ENCOUNTER — Ambulatory Visit
Admission: RE | Admit: 2020-01-16 | Discharge: 2020-01-16 | Disposition: A | Discharge status: Home / Self Care | Payer: Medicare Other | Source: Ambulatory Visit | Attending: Radiation Oncology | Admitting: Radiation Oncology

## 2020-01-16 DIAGNOSIS — Z510 Encounter for antineoplastic radiation therapy: Secondary | ICD-10-CM | POA: Diagnosis not present

## 2020-01-16 DIAGNOSIS — C3412 Malignant neoplasm of upper lobe, left bronchus or lung: Secondary | ICD-10-CM | POA: Diagnosis not present

## 2020-01-16 NOTE — Telephone Encounter (Signed)
Scheduled per los. Called and left msg. Mailed printout  °

## 2020-01-17 ENCOUNTER — Ambulatory Visit
Admission: RE | Admit: 2020-01-17 | Discharge: 2020-01-17 | Disposition: A | Discharge status: Home / Self Care | Payer: Medicare Other | Source: Ambulatory Visit | Attending: Radiation Oncology | Admitting: Radiation Oncology

## 2020-01-17 DIAGNOSIS — Z510 Encounter for antineoplastic radiation therapy: Secondary | ICD-10-CM | POA: Diagnosis not present

## 2020-01-17 DIAGNOSIS — C3412 Malignant neoplasm of upper lobe, left bronchus or lung: Secondary | ICD-10-CM | POA: Diagnosis not present

## 2020-01-18 ENCOUNTER — Ambulatory Visit
Admission: RE | Admit: 2020-01-18 | Discharge: 2020-01-18 | Disposition: A | Discharge status: Home / Self Care | Payer: Medicare Other | Source: Ambulatory Visit | Attending: Radiation Oncology | Admitting: Radiation Oncology

## 2020-01-18 DIAGNOSIS — Z510 Encounter for antineoplastic radiation therapy: Secondary | ICD-10-CM | POA: Diagnosis not present

## 2020-01-18 DIAGNOSIS — C3412 Malignant neoplasm of upper lobe, left bronchus or lung: Secondary | ICD-10-CM | POA: Diagnosis not present

## 2020-01-21 ENCOUNTER — Other Ambulatory Visit: Payer: Self-pay

## 2020-01-21 ENCOUNTER — Inpatient Hospital Stay: Payer: Medicare Other

## 2020-01-21 ENCOUNTER — Ambulatory Visit
Admission: RE | Admit: 2020-01-21 | Discharge: 2020-01-21 | Disposition: A | Discharge status: Home / Self Care | Payer: Medicare Other | Source: Ambulatory Visit | Attending: Radiation Oncology | Admitting: Radiation Oncology

## 2020-01-21 DIAGNOSIS — Z510 Encounter for antineoplastic radiation therapy: Secondary | ICD-10-CM | POA: Diagnosis not present

## 2020-01-21 DIAGNOSIS — C3412 Malignant neoplasm of upper lobe, left bronchus or lung: Secondary | ICD-10-CM | POA: Diagnosis not present

## 2020-01-22 ENCOUNTER — Ambulatory Visit: Payer: Medicare Other

## 2020-01-24 ENCOUNTER — Other Ambulatory Visit: Payer: Self-pay | Admitting: Internal Medicine

## 2020-02-11 ENCOUNTER — Other Ambulatory Visit: Payer: Self-pay

## 2020-02-11 ENCOUNTER — Ambulatory Visit (HOSPITAL_COMMUNITY)
Admission: RE | Admit: 2020-02-11 | Discharge: 2020-02-11 | Disposition: A | Discharge status: Home / Self Care | Payer: Medicare Other | Source: Ambulatory Visit | Attending: Internal Medicine | Admitting: Internal Medicine

## 2020-02-11 ENCOUNTER — Inpatient Hospital Stay: Payer: Medicare Other | Attending: Hematology

## 2020-02-11 DIAGNOSIS — Z9989 Dependence on other enabling machines and devices: Secondary | ICD-10-CM | POA: Diagnosis not present

## 2020-02-11 DIAGNOSIS — Z5112 Encounter for antineoplastic immunotherapy: Secondary | ICD-10-CM | POA: Insufficient documentation

## 2020-02-11 DIAGNOSIS — J432 Centrilobular emphysema: Secondary | ICD-10-CM | POA: Diagnosis not present

## 2020-02-11 DIAGNOSIS — E785 Hyperlipidemia, unspecified: Secondary | ICD-10-CM | POA: Insufficient documentation

## 2020-02-11 DIAGNOSIS — S2242XA Multiple fractures of ribs, left side, initial encounter for closed fracture: Secondary | ICD-10-CM | POA: Diagnosis not present

## 2020-02-11 DIAGNOSIS — C3490 Malignant neoplasm of unspecified part of unspecified bronchus or lung: Secondary | ICD-10-CM

## 2020-02-11 DIAGNOSIS — C3492 Malignant neoplasm of unspecified part of left bronchus or lung: Secondary | ICD-10-CM | POA: Diagnosis not present

## 2020-02-11 DIAGNOSIS — K219 Gastro-esophageal reflux disease without esophagitis: Secondary | ICD-10-CM | POA: Diagnosis not present

## 2020-02-11 DIAGNOSIS — G4733 Obstructive sleep apnea (adult) (pediatric): Secondary | ICD-10-CM | POA: Insufficient documentation

## 2020-02-11 DIAGNOSIS — Z79899 Other long term (current) drug therapy: Secondary | ICD-10-CM | POA: Insufficient documentation

## 2020-02-11 DIAGNOSIS — F419 Anxiety disorder, unspecified: Secondary | ICD-10-CM | POA: Insufficient documentation

## 2020-02-11 DIAGNOSIS — I10 Essential (primary) hypertension: Secondary | ICD-10-CM | POA: Diagnosis not present

## 2020-02-11 DIAGNOSIS — I2510 Atherosclerotic heart disease of native coronary artery without angina pectoris: Secondary | ICD-10-CM | POA: Diagnosis not present

## 2020-02-11 DIAGNOSIS — Z7901 Long term (current) use of anticoagulants: Secondary | ICD-10-CM | POA: Diagnosis not present

## 2020-02-11 DIAGNOSIS — Z5111 Encounter for antineoplastic chemotherapy: Secondary | ICD-10-CM | POA: Diagnosis present

## 2020-02-11 LAB — CBC WITH DIFFERENTIAL (CANCER CENTER ONLY)
Abs Immature Granulocytes: 0.04 10*3/uL (ref 0.00–0.07)
Basophils Absolute: 0.1 10*3/uL (ref 0.0–0.1)
Basophils Relative: 1 %
Eosinophils Absolute: 0.1 10*3/uL (ref 0.0–0.5)
Eosinophils Relative: 1 %
HCT: 43.4 % (ref 39.0–52.0)
Hemoglobin: 14.6 g/dL (ref 13.0–17.0)
Immature Granulocytes: 0 %
Lymphocytes Relative: 20 %
Lymphs Abs: 1.8 10*3/uL (ref 0.7–4.0)
MCH: 30.6 pg (ref 26.0–34.0)
MCHC: 33.6 g/dL (ref 30.0–36.0)
MCV: 91 fL (ref 80.0–100.0)
Monocytes Absolute: 1.2 10*3/uL — ABNORMAL HIGH (ref 0.1–1.0)
Monocytes Relative: 13 %
Neutro Abs: 6.2 10*3/uL (ref 1.7–7.7)
Neutrophils Relative %: 65 %
Platelet Count: 125 10*3/uL — ABNORMAL LOW (ref 150–400)
RBC: 4.77 MIL/uL (ref 4.22–5.81)
RDW: 15.6 % — ABNORMAL HIGH (ref 11.5–15.5)
WBC Count: 9.3 10*3/uL (ref 4.0–10.5)
nRBC: 0 % (ref 0.0–0.2)

## 2020-02-11 LAB — CMP (CANCER CENTER ONLY)
ALT: 31 U/L (ref 0–44)
AST: 28 U/L (ref 15–41)
Albumin: 3.7 g/dL (ref 3.5–5.0)
Alkaline Phosphatase: 73 U/L (ref 38–126)
Anion gap: 7 (ref 5–15)
BUN: 8 mg/dL (ref 8–23)
CO2: 25 mmol/L (ref 22–32)
Calcium: 9.3 mg/dL (ref 8.9–10.3)
Chloride: 106 mmol/L (ref 98–111)
Creatinine: 0.74 mg/dL (ref 0.61–1.24)
GFR, Estimated: >60 mL/min (ref >60)
Glucose, Bld: 102 mg/dL — ABNORMAL HIGH (ref 70–99)
Potassium: 3.9 mmol/L (ref 3.5–5.1)
Sodium: 138 mmol/L (ref 135–145)
Total Bilirubin: 1.2 mg/dL (ref 0.3–1.2)
Total Protein: 6.9 g/dL (ref 6.5–8.1)

## 2020-02-11 MED ORDER — IOHEXOL 300 MG/ML  SOLN
75.0000 mL | Freq: Once | INTRAMUSCULAR | Status: AC | PRN
  Administered 2020-02-11: 75 mL via INTRAVENOUS

## 2020-02-12 ENCOUNTER — Encounter: Payer: Self-pay | Admitting: Internal Medicine

## 2020-02-12 ENCOUNTER — Other Ambulatory Visit: Payer: Self-pay

## 2020-02-12 ENCOUNTER — Inpatient Hospital Stay (HOSPITAL_BASED_OUTPATIENT_CLINIC_OR_DEPARTMENT_OTHER): Payer: Medicare Other | Admitting: Internal Medicine

## 2020-02-12 VITALS — BP 129/68 | HR 75 | Temp 97.4°F | Resp 16 | Ht 71.0 in | Wt 184.2 lb

## 2020-02-12 DIAGNOSIS — E039 Hypothyroidism, unspecified: Secondary | ICD-10-CM | POA: Diagnosis not present

## 2020-02-12 DIAGNOSIS — Z5112 Encounter for antineoplastic immunotherapy: Secondary | ICD-10-CM | POA: Insufficient documentation

## 2020-02-12 DIAGNOSIS — Z7189 Other specified counseling: Secondary | ICD-10-CM

## 2020-02-12 DIAGNOSIS — C3492 Malignant neoplasm of unspecified part of left bronchus or lung: Secondary | ICD-10-CM | POA: Diagnosis not present

## 2020-02-12 DIAGNOSIS — I10 Essential (primary) hypertension: Secondary | ICD-10-CM | POA: Diagnosis not present

## 2020-02-12 DIAGNOSIS — F419 Anxiety disorder, unspecified: Secondary | ICD-10-CM | POA: Diagnosis not present

## 2020-02-12 DIAGNOSIS — K219 Gastro-esophageal reflux disease without esophagitis: Secondary | ICD-10-CM | POA: Diagnosis not present

## 2020-02-12 DIAGNOSIS — E785 Hyperlipidemia, unspecified: Secondary | ICD-10-CM | POA: Diagnosis not present

## 2020-02-12 NOTE — Progress Notes (Signed)
DISCONTINUE ON PATHWAY REGIMEN - Non-Small Cell Lung     Administer weekly:     Paclitaxel      Carboplatin   **Always confirm dose/schedule in your pharmacy ordering system**  REASON: Continuation Of Treatment PRIOR TREATMENT: [id number]: Carboplatin AUC=2 + Paclitaxel 45 mg/m2 Weekly During Radiation TREATMENT RESPONSE: Partial Response (PR)  START ON PATHWAY REGIMEN - Non-Small Cell Lung     A cycle is every 28 days:     Durvalumab   **Always confirm dose/schedule in your pharmacy ordering system**  Patient Characteristics: Preoperative or Nonsurgical Candidate (Clinical Staging), Stage III - Nonsurgical Candidate (Nonsquamous and Squamous), PS = 0, 1 Therapeutic Status: Preoperative or Nonsurgical Candidate (Clinical Staging) AJCC T Category: cT3 AJCC N Category: cN2 AJCC M Category: cM0 AJCC 8 Stage Grouping: IIIB ECOG Performance Status: 1 Intent of Therapy: Curative Intent, Discussed with Patient

## 2020-02-12 NOTE — Progress Notes (Signed)
[facility] Telephone:(336) [phone]   Fax:(336) [phone]  OFFICE PROGRESS NOTE  [name], [name], MD [place] [address]  DIAGNOSIS: 1)Stage IIIbnon-small cell lung cancer, squamous cell carcinoma. The patient presented with a left upper lobe lung mass as well as associated mediastinal lymphadenopathy and contralateral right hilar mild hypermetabolism. There was heterogeneous marrow uptake in the spine but with more focal areas at L4 without imaging correlation on CT scan. The patient was diagnosed in August 2021. 2) CLL stage 0, diagnosed in 05-30-14  PD-L1 expression: 2%  PRIOR THERAPY: Concurrent chemoradiation with carboplatin for an AUC of 2 and paclitaxel 45 mg per metered squared weekly. First dose expected on 12/13/2019. Status post 5 cycles.   Last dose was giving January 14, 2020.  CURRENT THERAPY:  Consolidation treatment with immunotherapy with Imfinzi 1500 mg IV every 4 weeks.  First dose February 19, 2020.  INTERVAL HISTORY: Joe [name] 74 y.o. male returns to the clinic today for follow-up visit.  The patient is feeling fine today with no concerning complaints.  He has improvement of the odynophagia and dysphagia and was able to eat normal food last week.  He denied having any current chest pain, shortness of breath, cough or hemoptysis.  He denied having any fever or chills.  He has no nausea, vomiting, diarrhea or constipation.  He has no headache or visual changes.  He completed a course of concurrent chemoradiation and tolerated it fairly well except for the dysphagia and odynophagia.  The patient had repeat CT scan of the chest performed recently and he is here for evaluation and discussion of his scan results.  MEDICAL HISTORY: Past Medical History:  Diagnosis Date  . Alcohol abuse, daily use 08/24/2010   Stopped 2013-05-29   . Arthralgia 21-Dec-2013   9/15 Not related to statins OA   . Cataract   . Chronic lymphocytic  leukemia ([place]) 07-18-2014   chronic stage 1- no symtoms  . CLL (chronic lymphocytic leukemia) ([place]) 08/08/2014   2014-05-30 Dr Burr Medico Stage 0  . COPD mixed type ([place]) 07/26/2007   Smoker - stopped 6/14   . De Quervain's tenosynovitis, right Dec 21, 2013   05/29/13   . Depression    at times  . Dysrhythmia    PAF  . Dysuria 12/21/13   9/15 - poss stricture Urol ref was offered   . Gallstones 11/16/2017   Asymptomatic Pt refused surg ref  . Generalized anxiety disorder 09/07/2012   Chronic   Potential benefits of a long term steroid  use as well as potential risks  and complications were explained to the patient and were aknowledged.     [name] GERD 12/02/2006   Chronic     . Grief May 22, 2016   [name] died in 05/30/14  . Gynecomastia 12/21/2013   Benign B 05-29-2013   . Hyperlipidemia   . Hypertension   . Hypothyroidism 12/25/2014   May 30, 2014 On Levothyroxine   . Intertrigo 02/02/2012   11/13   . Neoplasm of uncertain behavior of skin 03/12/2013   12/14 R ear, chest   . OSA on CPAP    mild with AHI 9/hr and oxygen desats as low as 75%  . Paresis ([place])    right- s/p cerv decompression  . PERIORBITAL CELLULITIS 02/22/2009   Qualifier: Diagnosis of  By: Diona Browner MD, Amy    . 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  MEDICATIONS:  Current Outpatient Medications  Medication Sig Dispense Refill  . amLODipine (NORVASC) 5 MG tablet TAKE 1 TABLET BY MOUTH EVERY DAY 90 tablet 2  . atenolol (TENORMIN) 25 MG tablet Take 1 tablet (25 mg total) by mouth daily as needed (for palpitations). 90 tablet 3  . Cholecalciferol (EQL VITAMIN D3) 1000 UNITS tablet Take 1,000 Units by mouth daily.      . diazepam (VALIUM) 5 MG tablet Take 1 tablet (5 mg total) by mouth daily.    [name] ELIQUIS 5 MG TABS tablet TAKE 1 TABLET(5 MG) BY MOUTH TWICE DAILY 180 tablet 1  . levothyroxine (SYNTHROID) 50 MCG tablet TAKE 1 TABLET BY MOUTH EVERY DAY 90 tablet 2  . lovastatin (MEVACOR) 20 MG tablet TAKE 1 TABLET BY MOUTH EVERY  NIGHT AT BEDTIME 90 tablet 2  . Multiple Vitamin (MULTIVITAMIN) tablet Take 1 tablet by mouth daily. Centrum Silver.    Vladimir Faster Glycol-Propyl Glycol (SYSTANE) 0.4-0.3 % SOLN Place 1 drop into both eyes daily as needed (Dry eyes). Ultra    . prochlorperazine (COMPAZINE) 10 MG tablet Take 1 tablet (10 mg total) by mouth every 6 (six) hours as needed. 30 tablet 2   No current facility-administered medications for this visit.    SURGICAL HISTORY:  Past Surgical History:  Procedure Laterality Date  . BICEPS TENDON REPAIR    . BRONCHIAL BIOPSY  10/23/2019   Procedure: BRONCHIAL BIOPSIES;  Surgeon: [name], MD;  Location[facility] ENDOSCOPY;  Service: Pulmonary;;  . BRONCHIAL BIOPSY  11/20/2019   Procedure: BRONCHIAL BIOPSIES;  Surgeon: [name], MD;  Location: [facility] ENDOSCOPY;  Service: Pulmonary;;  . BRONCHIAL BRUSHINGS  10/23/2019   Procedure: BRONCHIAL BRUSHINGS;  Surgeon: [name], MD;  Location: [facility] ENDOSCOPY;  Service: Pulmonary;;  . BRONCHIAL BRUSHINGS  11/20/2019   Procedure: BRONCHIAL BRUSHINGS;  Surgeon: [name], MD;  Location: [facility] ENDOSCOPY;  Service: Pulmonary;;  . BRONCHIAL NEEDLE ASPIRATION BIOPSY  10/23/2019   Procedure: BRONCHIAL NEEDLE ASPIRATION BIOPSIES;  Surgeon: [name], MD;  Location: [facility] ENDOSCOPY;  Service: Pulmonary;;  . BRONCHIAL NEEDLE ASPIRATION BIOPSY  11/20/2019   Procedure: BRONCHIAL NEEDLE ASPIRATION BIOPSIES;  Surgeon: [name], MD;  Location: [place];  Service: Pulmonary;;  . BRONCHIAL WASHINGS  11/20/2019   Procedure: BRONCHIAL WASHINGS;  Surgeon: [name], MD;  Location: [facility] ENDOSCOPY;  Service: Pulmonary;;  . CATARACT EXTRACTION Left   . COLONOSCOPY  04-14-99   Dr Flossie Dibble polyp-TA in epic  . POLYPECTOMY  04-14-99  . POSTERIOR LAMINECTOMY / DECOMPRESSION CERVICAL SPINE     Dr Saintclair Halsted  . RETINAL DETACHMENT SURGERY     left eye, 2007 x2, 2008 x 3  . ROTATOR CUFF REPAIR  2004   right  . TONSILLECTOMY  [id number]  .  VIDEO BRONCHOSCOPY WITH ENDOBRONCHIAL NAVIGATION N/A 10/23/2019   Procedure: VIDEO BRONCHOSCOPY WITH ENDOBRONCHIAL NAVIGATION;  Surgeon: [name], MD;  Location: [place] ENDOSCOPY;  Service: Pulmonary;  Laterality: N/A;  . VIDEO BRONCHOSCOPY WITH ENDOBRONCHIAL NAVIGATION N/A 11/20/2019   Procedure: VIDEO BRONCHOSCOPY WITH ENDOBRONCHIAL NAVIGATION;  Surgeon: [name], MD;  Location: [place] ENDOSCOPY;  Service: Pulmonary;  Laterality: N/A;  . VIDEO BRONCHOSCOPY WITH ENDOBRONCHIAL ULTRASOUND N/A 10/23/2019   Procedure: VIDEO BRONCHOSCOPY WITH ENDOBRONCHIAL ULTRASOUND;  Surgeon: [name], MD;  Location: [place] ENDOSCOPY;  Service: Pulmonary;  Laterality: N/A;    REVIEW OF SYSTEMS:  Constitutional: negative Eyes: negative Ears, nose, mouth, throat, and face: negative Respiratory: negative Cardiovascular: negative Gastrointestinal: negative  Genitourinary:negative Integument/breast: negative Hematologic/lymphatic: negative Musculoskeletal:negative Neurological: negative Behavioral/Psych: negative Endocrine: negative Allergic/Immunologic: negative   PHYSICAL EXAMINATION: General appearance: alert, cooperative and no distress Head: Normocephalic, without obvious abnormality, atraumatic Neck: no adenopathy, no JVD, supple, symmetrical, trachea midline and thyroid not enlarged, symmetric, no tenderness/mass/nodules Lymph nodes: Cervical, supraclavicular, and axillary nodes normal. Resp: clear to auscultation bilaterally Back: symmetric, no curvature. ROM normal. No CVA tenderness. Cardio: regular rate and rhythm, S1, S2 normal, no murmur, click, rub or gallop GI: soft, non-tender; bowel sounds normal; no masses,  no organomegaly Extremities: extremities normal, atraumatic, no cyanosis or edema Neurologic: Alert and oriented X 3, normal strength and tone. Normal symmetric reflexes. Normal coordination and gait  ECOG PERFORMANCE STATUS: 1 - Symptomatic but completely ambulatory  Blood  pressure 129/68, pulse 75, temperature (!) 97.4 F (36.3 C), temperature source Tympanic, resp. rate 16, height '5\' 11"'  (1.803 m), weight 184 lb 3.2 oz (83.6 kg), SpO2 97 %.  LABORATORY DATA: Lab Results  Component Value Date   WBC 9.3 02/11/2020   HGB 14.6 02/11/2020   HCT 43.4 02/11/2020   MCV 91.0 02/11/2020   PLT 125 (L) 02/11/2020      Chemistry      Component Value Date/Time   NA 138 02/11/2020 0935   NA 143 03/10/2017 0911   K 3.9 02/11/2020 0935   K 4.7 03/10/2017 0911   CL 106 02/11/2020 0935   CO2 25 02/11/2020 0935   CO2 30 (H) 03/10/2017 0911   BUN 8 02/11/2020 0935   BUN 10.7 03/10/2017 0911   CREATININE 0.74 02/11/2020 0935   CREATININE 0.8 03/10/2017 0911      Component Value Date/Time   CALCIUM 9.3 02/11/2020 0935   CALCIUM 9.4 03/10/2017 0911   ALKPHOS 73 02/11/2020 0935   ALKPHOS 76 03/10/2017 0911   AST 28 02/11/2020 0935   AST 34 03/10/2017 0911   ALT 31 02/11/2020 0935   ALT 45 03/10/2017 0911   BILITOT 1.2 02/11/2020 0935   BILITOT 1.41 (H) 03/10/2017 0911       RADIOGRAPHIC STUDIES: CT Chest W Contrast  Result Date: 02/11/2020 CLINICAL DATA:  Follow up lung cancer post chemotherapy and radiation therapy completed 10 months ago. EXAM: CT CHEST WITH CONTRAST TECHNIQUE: Multidetector CT imaging of the chest was performed during intravenous contrast administration. CONTRAST:  41m OMNIPAQUE IOHEXOL 300 MG/ML  SOLN COMPARISON:  Chest CT 09/07/2019.  PET-CT 11/09/2019. FINDINGS: Cardiovascular: Atherosclerosis of the aorta, great vessels and coronary arteries. No acute vascular findings. Probable calcifications of the aortic valve. The heart size is normal. There is no pericardial effusion. Mediastinum/Nodes: There are no enlarged mediastinal, hilar or axillary lymph nodes.Continued regression of previously demonstrated mediastinal lymph nodes, the largest now measuring 9 mm in the left periaortic region (image 54/2). The thyroid gland, trachea and  esophagus demonstrate no significant findings. Lungs/Pleura: There is no pleural effusion. Moderate centrilobular and paraseptal emphysema. The left upper lobe mass has decreased in size. This has a superior component measuring 3.5 x 2.5 cm on image 43/7 and a more inferior component measuring 4.0 x 2.9 cm on image 50/7. There are mild radiation changes in the surrounding left upper lobe. Stable small perifissural nodules along the minor fissure, measuring up to 6 mm on image 70/7. No new or enlarging nodules. Upper abdomen: Mild hepatic steatosis and calcified gallstones are again noted. Small lymph nodes in the porta hepatis are stable. No adrenal mass or acute findings. Musculoskeletal/Chest wall: There is no chest wall mass  or suspicious osseous finding. Old left-sided rib fractures. IMPRESSION: 1. Interval decreased size of left upper lobe mass with surrounding radiation changes. 2. Continued regression of previously demonstrated mediastinal lymph nodes, consistent with response to therapy. 3. No evidence of progressive metastatic disease. 4. Aortic Atherosclerosis (ICD10-I70.0) and Emphysema (ICD10-J43.9). Electronically Signed   By: Richardean Sale M.D.   On: 02/11/2020 14:17    ASSESSMENT AND PLAN: This is a very pleasant 74 years old white male with a stage IIIb non-small cell lung cancer, squamous cell carcinoma diagnosed in August of 2021.  The patient also has a history of stage 0 CLL.  He underwent a course of concurrent chemoradiation with weekly carboplatin for AUC of 2 and paclitaxel 45 mg/M2 status post 5 cycles of the treatment.  He tolerated this treatment well with no concerning adverse effect except for mild odynophagia and dysphagia as well as thrombocytopenia. The patient had repeat CT scan of the chest performed recently.  I personally and independently reviewed the scan images and discussed the results with the patient and showed him the images today. His scan showed improvement of his  disease with decrease in the size of the left upper lobe lung mass as well as decrease in the mediastinal lymphadenopathy and no evidence of metastatic disease. I discussed with the patient the option of consolidation treatment with immunotherapy with Imfinzi 1500 mg IV every 4 weeks.  I discussed with him the adverse effect of this treatment including but not limited to immunotherapy mediated skin rash, diarrhea, inflammation of the lung, kidney, liver, thyroid or other endocrine dysfunction. The patient is interested in proceeding with the consolidation treatment and he is expected to start the first cycle of this treatment on February 19, 2020. He will come back for follow-up visit in 5 weeks for evaluation and management of any adverse effect of his treatment before starting cycle #2. The patient was advised to call immediately if he has any concerning symptoms in the interval. The patient voices understanding of current disease status and treatment options and is in agreement with the current care plan.  All questions were answered. The patient knows to call the clinic with any problems, questions or concerns. We can certainly see the patient much sooner if necessary.  Disclaimer: This note was dictated with voice recognition software. Similar sounding words can inadvertently be transcribed and may not be corrected upon review.

## 2020-02-18 ENCOUNTER — Telehealth: Payer: Self-pay | Admitting: Internal Medicine

## 2020-02-18 NOTE — Telephone Encounter (Signed)
Called and spoke with patient. confirmed 11/30 appts

## 2020-02-19 ENCOUNTER — Other Ambulatory Visit: Payer: Self-pay

## 2020-02-19 ENCOUNTER — Inpatient Hospital Stay: Payer: Medicare Other

## 2020-02-19 VITALS — BP 136/82 | HR 72 | Temp 98.0°F | Resp 20 | Wt 183.8 lb

## 2020-02-19 DIAGNOSIS — E785 Hyperlipidemia, unspecified: Secondary | ICD-10-CM | POA: Diagnosis not present

## 2020-02-19 DIAGNOSIS — K219 Gastro-esophageal reflux disease without esophagitis: Secondary | ICD-10-CM | POA: Diagnosis not present

## 2020-02-19 DIAGNOSIS — C3492 Malignant neoplasm of unspecified part of left bronchus or lung: Secondary | ICD-10-CM | POA: Diagnosis not present

## 2020-02-19 DIAGNOSIS — I10 Essential (primary) hypertension: Secondary | ICD-10-CM | POA: Diagnosis not present

## 2020-02-19 DIAGNOSIS — E039 Hypothyroidism, unspecified: Secondary | ICD-10-CM

## 2020-02-19 DIAGNOSIS — Z5112 Encounter for antineoplastic immunotherapy: Secondary | ICD-10-CM | POA: Diagnosis not present

## 2020-02-19 DIAGNOSIS — F419 Anxiety disorder, unspecified: Secondary | ICD-10-CM | POA: Diagnosis not present

## 2020-02-19 LAB — CMP (CANCER CENTER ONLY)
ALT: 29 U/L (ref 0–44)
AST: 24 U/L (ref 15–41)
Albumin: 3.8 g/dL (ref 3.5–5.0)
Alkaline Phosphatase: 75 U/L (ref 38–126)
Anion gap: 13 (ref 5–15)
BUN: 14 mg/dL (ref 8–23)
CO2: 22 mmol/L (ref 22–32)
Calcium: 10.2 mg/dL (ref 8.9–10.3)
Chloride: 105 mmol/L (ref 98–111)
Creatinine: 0.76 mg/dL (ref 0.61–1.24)
GFR, Estimated: >60 mL/min (ref >60)
Glucose, Bld: 103 mg/dL — ABNORMAL HIGH (ref 70–99)
Potassium: 3.8 mmol/L (ref 3.5–5.1)
Sodium: 140 mmol/L (ref 135–145)
Total Bilirubin: 1 mg/dL (ref 0.3–1.2)
Total Protein: 7.3 g/dL (ref 6.5–8.1)

## 2020-02-19 LAB — CBC WITH DIFFERENTIAL (CANCER CENTER ONLY)
Abs Immature Granulocytes: 0.1 10*3/uL — ABNORMAL HIGH (ref 0.00–0.07)
Basophils Absolute: 0.1 10*3/uL (ref 0.0–0.1)
Basophils Relative: 1 %
Eosinophils Absolute: 0.6 10*3/uL — ABNORMAL HIGH (ref 0.0–0.5)
Eosinophils Relative: 6 %
HCT: 44.8 % (ref 39.0–52.0)
Hemoglobin: 15.1 g/dL (ref 13.0–17.0)
Immature Granulocytes: 1 %
Lymphocytes Relative: 19 %
Lymphs Abs: 1.9 10*3/uL (ref 0.7–4.0)
MCH: 30.5 pg (ref 26.0–34.0)
MCHC: 33.7 g/dL (ref 30.0–36.0)
MCV: 90.5 fL (ref 80.0–100.0)
Monocytes Absolute: 1.1 10*3/uL — ABNORMAL HIGH (ref 0.1–1.0)
Monocytes Relative: 11 %
Neutro Abs: 6.2 10*3/uL (ref 1.7–7.7)
Neutrophils Relative %: 62 %
Platelet Count: 125 10*3/uL — ABNORMAL LOW (ref 150–400)
RBC: 4.95 MIL/uL (ref 4.22–5.81)
RDW: 15.5 % (ref 11.5–15.5)
WBC Count: 9.9 10*3/uL (ref 4.0–10.5)
nRBC: 0 % (ref 0.0–0.2)

## 2020-02-19 LAB — TSH: TSH: 3.139 u[IU]/mL (ref 0.320–4.118)

## 2020-02-19 MED ORDER — SODIUM CHLORIDE 0.9 % IV SOLN
Freq: Once | INTRAVENOUS | Status: AC
  Filled 2020-02-19: qty 250

## 2020-02-19 MED ORDER — SODIUM CHLORIDE 0.9 % IV SOLN
1500.0000 mg | Freq: Once | INTRAVENOUS | Status: AC
  Administered 2020-02-19: 1500 mg via INTRAVENOUS
  Filled 2020-02-19: qty 30

## 2020-02-19 NOTE — Progress Notes (Signed)
Pt. stable for discharge, left via ambulation, no respiratory distress noted.

## 2020-02-19 NOTE — Patient Instructions (Signed)
[place] Discharge Instructions for Patients Receiving Chemotherapy  Today you received the following immunotherapy agent: Durvalumab (Imfinzi)  To help prevent nausea and vomiting after your treatment, we encourage you to take your nausea medication as directed by your MD.   If you develop nausea and vomiting that is not controlled by your nausea medication, call the clinic.   BELOW ARE SYMPTOMS THAT SHOULD BE REPORTED IMMEDIATELY:  *FEVER GREATER THAN 100.5 F  *CHILLS WITH OR WITHOUT FEVER  NAUSEA AND VOMITING THAT IS NOT CONTROLLED WITH YOUR NAUSEA MEDICATION  *UNUSUAL SHORTNESS OF BREATH  *UNUSUAL BRUISING OR BLEEDING  TENDERNESS IN MOUTH AND THROAT WITH OR WITHOUT PRESENCE OF ULCERS  *URINARY PROBLEMS  *BOWEL PROBLEMS  UNUSUAL RASH Items with * indicate a potential emergency and should be followed up as soon as possible.  Feel free to call the clinic should you have any questions or concerns. The clinic phone number is [phone].  Please show the [facility] at check-in to the Emergency Department and triage nurse.  Durvalumab injection What is this medicine? DURVALUMAB (dur VAL ue mab) is a monoclonal antibody. It is used to treat urothelial cancer and lung cancer. This medicine may be used for other purposes; ask your health care provider or pharmacist if you have questions. COMMON BRAND NAME(S): IMFINZI What should I tell my health care provider before I take this medicine? They need to know if you have any of these conditions:  diabetes  immune system problems  infection  inflammatory bowel disease  kidney disease  liver disease  lung or breathing disease  lupus  organ transplant  stomach or intestine problems  thyroid disease  an unusual or allergic reaction to durvalumab, other medicines, foods, dyes, or preservatives  pregnant or trying to get pregnant  breast-feeding How should I use this medicine? This  medicine is for infusion into a vein. It is given by a health care professional in a hospital or clinic setting. A special MedGuide will be given to you before each treatment. Be sure to read this information carefully each time. Talk to your pediatrician regarding the use of this medicine in children. Special care may be needed. Overdosage: If you think you have taken too much of this medicine contact a poison control center or emergency room at once. NOTE: This medicine is only for you. Do not share this medicine with others. What if I miss a dose? It is important not to miss your dose. Call your doctor or health care professional if you are unable to keep an appointment. What may interact with this medicine? Interactions have not been studied. This list may not describe all possible interactions. Give your health care provider a list of all the medicines, herbs, non-prescription drugs, or dietary supplements you use. Also tell them if you smoke, drink alcohol, or use illegal drugs. Some items may interact with your medicine. What should I watch for while using this medicine? This drug may make you feel generally unwell. Continue your course of treatment even though you feel ill unless your doctor tells you to stop. You may need blood work done while you are taking this medicine. Do not become pregnant while taking this medicine or for 3 months after stopping it. Women should inform their doctor if they wish to become pregnant or think they might be pregnant. There is a potential for serious side effects to an unborn child. Talk to your health care professional or pharmacist for more  information. Do not breast-feed an infant while taking this medicine or for 3 months after stopping it. What side effects may I notice from receiving this medicine? Side effects that you should report to your doctor or health care professional as soon as possible:  allergic reactions like skin rash, itching or hives,  swelling of the face, lips, or tongue  black, tarry stools  bloody or watery diarrhea  breathing problems  change in emotions or moods  change in sex drive  changes in vision  chest pain or chest tightness  chills  confusion  cough  facial flushing  fever  headache  signs and symptoms of high blood sugar such as dizziness; dry mouth; dry skin; fruity breath; nausea; stomach pain; increased hunger or thirst; increased urination  signs and symptoms of liver injury like dark yellow or brown urine; general ill feeling or flu-like symptoms; light-colored stools; loss of appetite; nausea; right upper belly pain; unusually weak or tired; yellowing of the eyes or skin  stomach pain  trouble passing urine or change in the amount of urine  weight gain or weight loss Side effects that usually do not require medical attention (report these to your doctor or health care professional if they continue or are bothersome):  bone pain  constipation  loss of appetite  muscle pain  nausea  swelling of the ankles, feet, hands  tiredness This list may not describe all possible side effects. Call your doctor for medical advice about side effects. You may report side effects to FDA at 1-800-FDA-1088. Where should I keep my medicine? This drug is given in a hospital or clinic and will not be stored at home. NOTE: This sheet is a summary. It may not cover all possible information. If you have questions about this medicine, talk to your doctor, pharmacist, or health care provider.  2020 Elsevier/Gold Standard (2016-05-18 19:25:04)

## 2020-02-20 ENCOUNTER — Telehealth: Payer: Self-pay | Admitting: *Deleted

## 2020-02-21 ENCOUNTER — Telehealth: Payer: Self-pay

## 2020-02-21 ENCOUNTER — Ambulatory Visit: Payer: Self-pay | Admitting: Radiation Oncology

## 2020-02-21 ENCOUNTER — Ambulatory Visit (HOSPITAL_COMMUNITY): Payer: Medicare Other | Attending: Cardiovascular Disease

## 2020-02-21 ENCOUNTER — Other Ambulatory Visit: Payer: Self-pay

## 2020-02-21 DIAGNOSIS — I351 Nonrheumatic aortic (valve) insufficiency: Secondary | ICD-10-CM | POA: Insufficient documentation

## 2020-02-21 LAB — ECHOCARDIOGRAM COMPLETE
Area-P 1/2: 1.61 cm2
P 1/2 time: 498 msec
S' Lateral: 3.2 cm

## 2020-02-21 NOTE — Telephone Encounter (Signed)
-----   Message from Sueanne Margarita, MD sent at 02/21/2020 12:36 PM EST ----- Stable 2D echo showed normal heart function with mild to moderate AR - repeat echo in 1 year for AR

## 2020-02-21 NOTE — Telephone Encounter (Signed)
The patient has been notified of the result and verbalized understanding.  All questions (if any) were answered. Antonieta Iba, RN 02/21/2020 1:33 PM

## 2020-02-25 ENCOUNTER — Other Ambulatory Visit: Payer: Self-pay

## 2020-02-25 ENCOUNTER — Ambulatory Visit
Admission: RE | Admit: 2020-02-25 | Discharge: 2020-02-25 | Disposition: A | Discharge status: Home / Self Care | Payer: Medicare Other | Source: Ambulatory Visit | Attending: Radiation Oncology | Admitting: Radiation Oncology

## 2020-02-25 ENCOUNTER — Encounter: Payer: Self-pay | Admitting: Radiation Oncology

## 2020-02-25 VITALS — BP 131/79 | HR 73 | Temp 97.8°F | Resp 18 | Ht 71.0 in | Wt 187.4 lb

## 2020-02-25 DIAGNOSIS — I700 Atherosclerosis of aorta: Secondary | ICD-10-CM | POA: Insufficient documentation

## 2020-02-25 DIAGNOSIS — Z923 Personal history of irradiation: Secondary | ICD-10-CM | POA: Diagnosis not present

## 2020-02-25 DIAGNOSIS — Z79899 Other long term (current) drug therapy: Secondary | ICD-10-CM | POA: Diagnosis not present

## 2020-02-25 DIAGNOSIS — C3492 Malignant neoplasm of unspecified part of left bronchus or lung: Secondary | ICD-10-CM

## 2020-02-25 DIAGNOSIS — I082 Rheumatic disorders of both aortic and tricuspid valves: Secondary | ICD-10-CM | POA: Diagnosis not present

## 2020-02-25 DIAGNOSIS — C3412 Malignant neoplasm of upper lobe, left bronchus or lung: Secondary | ICD-10-CM | POA: Insufficient documentation

## 2020-02-25 DIAGNOSIS — K8020 Calculus of gallbladder without cholecystitis without obstruction: Secondary | ICD-10-CM | POA: Diagnosis not present

## 2020-02-25 DIAGNOSIS — K760 Fatty (change of) liver, not elsewhere classified: Secondary | ICD-10-CM | POA: Insufficient documentation

## 2020-02-25 DIAGNOSIS — Z7901 Long term (current) use of anticoagulants: Secondary | ICD-10-CM | POA: Diagnosis not present

## 2020-02-25 NOTE — Progress Notes (Signed)
Patient in for follow up. Feels much better is eating better has gained some weight. Denies any pain. BP 131/79 (BP Location: Left Arm, Patient Position: Sitting)   Pulse 73   Temp 97.8 F (36.6 C) (Temporal)   Resp 18   Ht 5\' 11"  (1.803 m)   Wt 187 lb 6 oz (85 kg)   SpO2 96%   BMI 26.13 kg/m  Filed Weights   02/25/20 1018  Weight: 187 lb 6 oz (85 kg)

## 2020-02-25 NOTE — Progress Notes (Signed)
Radiation Oncology         [phone] ________________________________  Name: Joe [name] MRN: [id number]  Date: 02/25/2020  DOB: 1945-07-05  Follow-Up Visit Note  CC: [name], [name], MD  Curt Bears, MD    ICD-10-CM   1. 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 Since the end of treatment, he underwent a chest CT scan on 02/11/2020 that showed an interval decrease in the size of the left upper lobe mass with surrounding radiation changes. There was continued regression of the previously demonstrated mediastinal lymph nodes, consistent with response to therapy. There was no evidence of progressive metastatic disease.  The patient began consolidation treatment with immunotherapy with Imfinzi on 02/19/2020 under the care of Dr. Julien Nordmann.         On review of systems, he reports feeling well.  He has minimal difficulties with swallowing or chunks of food. He denies any breathing problems.   Patient is quite happy with his results from his initial course of treatment with radiation and radiosensitizing chemotherapy          ALLERGIES:  has No Known Allergies.  Meds: Current Outpatient Medications  Medication Sig Dispense Refill  . amLODipine (NORVASC) 5 MG tablet TAKE 1 TABLET BY MOUTH EVERY DAY 90 tablet 2  . atenolol (TENORMIN) 25 MG tablet Take 1 tablet (25 mg total) by mouth daily as needed (for palpitations). 90 tablet 3  . Cholecalciferol (EQL VITAMIN D3) 1000 UNITS tablet Take 1,000 Units by mouth daily.      . diazepam (VALIUM) 5 MG tablet Take 1 tablet  (5 mg total) by mouth daily.    [name] ELIQUIS 5 MG TABS tablet TAKE 1 TABLET(5 MG) BY MOUTH TWICE DAILY 180 tablet 1  . levothyroxine (SYNTHROID) 50 MCG tablet TAKE 1 TABLET BY MOUTH EVERY DAY 90 tablet 2  . lovastatin (MEVACOR) 20 MG tablet TAKE 1 TABLET BY MOUTH EVERY NIGHT AT BEDTIME 90 tablet 2  . Multiple Vitamin (MULTIVITAMIN) tablet Take 1 tablet by mouth daily. Centrum Silver.    Vladimir Faster Glycol-Propyl Glycol (SYSTANE) 0.4-0.3 % SOLN Place 1 drop into both eyes daily as needed (Dry eyes). Ultra    . prochlorperazine (COMPAZINE) 10 MG tablet Take 1 tablet (10 mg total) by mouth every 6 (six) hours as needed. 30 tablet 2   No current facility-administered medications for this encounter.    Physical Findings: The patient is in no acute distress. Patient is alert and oriented.  height is 5\' 11"  (1.803 m) and weight is 187 lb 6 oz (85 kg). His temporal temperature is 97.8 F (36.6 C). His blood pressure is 131/79 and his pulse is 73. His respiration is 18 and oxygen saturation is 96%.  No significant changes. Lungs are clear to auscultation bilaterally. Heart has regular rate and rhythm. No palpable cervical, supraclavicular, or axillary adenopathy. Abdomen soft, non-tender, normal bowel sounds.   Lab Findings: Lab Results  Component Value Date   WBC 9.9 02/19/2020   HGB 15.1 02/19/2020   HCT 44.8 02/19/2020   MCV 90.5  02/19/2020   PLT 125 (L) 02/19/2020    Radiographic Findings: CT Chest W Contrast  Addendum Date: 02/21/2020   ADDENDUM REPORT: 02/21/2020 12:45 ADDENDUM: Correction of clinical data: The patient underwent chemoradiation from 12/10/2019 through 01/21/2020. Patient is now beginning immunotherapy. Electronically Signed   By: Richardean Sale M.D.   On: 02/21/2020 12:45   Result Date: 02/21/2020 CLINICAL DATA:  Follow up lung cancer post chemotherapy and radiation therapy completed 10 months ago. EXAM: CT CHEST WITH CONTRAST TECHNIQUE: Multidetector CT imaging of the chest  was performed during intravenous contrast administration. CONTRAST:  87mL OMNIPAQUE IOHEXOL 300 MG/ML  SOLN COMPARISON:  Chest CT 09/07/2019.  PET-CT 11/09/2019. FINDINGS: Cardiovascular: Atherosclerosis of the aorta, great vessels and coronary arteries. No acute vascular findings. Probable calcifications of the aortic valve. The heart size is normal. There is no pericardial effusion. Mediastinum/Nodes: There are no enlarged mediastinal, hilar or axillary lymph nodes.Continued regression of previously demonstrated mediastinal lymph nodes, the largest now measuring 9 mm in the left periaortic region (image 54/2). The thyroid gland, trachea and esophagus demonstrate no significant findings. Lungs/Pleura: There is no pleural effusion. Moderate centrilobular and paraseptal emphysema. The left upper lobe mass has decreased in size. This has a superior component measuring 3.5 x 2.5 cm on image 43/7 and a more inferior component measuring 4.0 x 2.9 cm on image 50/7. There are mild radiation changes in the surrounding left upper lobe. Stable small perifissural nodules along the minor fissure, measuring up to 6 mm on image 70/7. No new or enlarging nodules. Upper abdomen: Mild hepatic steatosis and calcified gallstones are again noted. Small lymph nodes in the porta hepatis are stable. No adrenal mass or acute findings. Musculoskeletal/Chest wall: There is no chest wall mass or suspicious osseous finding. Old left-sided rib fractures. IMPRESSION: 1. Interval decreased size of left upper lobe mass with surrounding radiation changes. 2. Continued regression of previously demonstrated mediastinal lymph nodes, consistent with response to therapy. 3. No evidence of progressive metastatic disease. 4. Aortic Atherosclerosis (ICD10-I70.0) and Emphysema (ICD10-J43.9). Electronically Signed: By: Richardean Sale M.D. On: 02/11/2020 14:17   ECHOCARDIOGRAM COMPLETE  Result Date: 02/21/2020    ECHOCARDIOGRAM REPORT   Patient Name:    Joe [name] Date of Exam: 02/21/2020 Medical Rec #:  [id number]        Height:       71.0 in Accession #:    [id number]       Weight:       183.7 lb Date of Birth:  08/22/1945       BSA:          2.034 m Patient Age:    74 years         BP:           129/68 mmHg Patient Gender: M                HR:           71 bpm. Exam Location:  [facility] Procedure: 2D Echo, 3D Echo, Cardiac Doppler, Color Doppler and Strain Analysis Indications:    I35.1 Aortic insufficiency  History:        Patient has prior history of Echocardiogram examinations, most                 recent 02/13/2019. COPD, Arrythmias:Atrial Fibrillation and                 Atrial Flutter, Signs/Symptoms:Shortness of Breath; Risk  Factors:Hypertension, Sleep Apnea, Former Smoker and                 Dyslipidemia. EtOH. Lung cancer.  Sonographer:    Jessee Avers, RDCS Referring Phys: [place]  1. Left ventricular ejection fraction, by estimation, is 60 to 65%. Left ventricular ejection fraction by 3D volume is 59 %. The left ventricle has normal function. The left ventricle has no regional wall motion abnormalities. Left ventricular diastolic  parameters were normal. The average left ventricular global longitudinal strain is -19.8 %. The global longitudinal strain is normal.  2. Right ventricular systolic function is normal. The right ventricular size is normal. Tricuspid regurgitation signal is inadequate for assessing PA pressure.  3. The mitral valve is grossly normal. No evidence of mitral valve regurgitation. No evidence of mitral stenosis.  4. The aortic valve is tricuspid. There is mild calcification of the aortic valve. There is mild thickening of the aortic valve. Aortic valve regurgitation is mild to moderate. No aortic stenosis is present. Aortic regurgitation PHT measures 498 msec.  5. The inferior vena cava is normal in size with greater than 50% respiratory variability, suggesting right atrial  pressure of 3 mmHg. Comparison(s): No significant change from prior study. 02/13/19 EF 60-65%. Mild-moderate AI. FINDINGS  Left Ventricle: Left ventricular ejection fraction, by estimation, is 60 to 65%. Left ventricular ejection fraction by 3D volume is 59 %. The left ventricle has normal function. The left ventricle has no regional wall motion abnormalities. The average left ventricular global longitudinal strain is -19.8 %. The global longitudinal strain is normal. The left ventricular internal cavity size was normal in size. There is no left ventricular hypertrophy. Left ventricular diastolic parameters were normal. Right Ventricle: The right ventricular size is normal. No increase in right ventricular wall thickness. Right ventricular systolic function is normal. Tricuspid regurgitation signal is inadequate for assessing PA pressure. Left Atrium: Left atrial size was normal in size. Right Atrium: Right atrial size was normal in size. Pericardium: Trivial pericardial effusion is present. Presence of pericardial fat pad. Mitral Valve: The mitral valve is grossly normal. No evidence of mitral valve regurgitation. No evidence of mitral valve stenosis. Tricuspid Valve: The tricuspid valve is grossly normal. Tricuspid valve regurgitation is not demonstrated. No evidence of tricuspid stenosis. Aortic Valve: The aortic valve is tricuspid. There is mild calcification of the aortic valve. There is mild thickening of the aortic valve. Aortic valve regurgitation is mild to moderate. Aortic regurgitation PHT measures 498 msec. No aortic stenosis is present. Pulmonic Valve: The pulmonic valve was grossly normal. Pulmonic valve regurgitation is not visualized. No evidence of pulmonic stenosis. Aorta: The aortic root and ascending aorta are structurally normal, with no evidence of dilitation. Venous: The right upper pulmonary vein is normal. The inferior vena cava is normal in size with greater than 50% respiratory  variability, suggesting right atrial pressure of 3 mmHg. IAS/Shunts: The atrial septum is grossly normal.  LEFT VENTRICLE PLAX 2D LVIDd:         4.50 cm         Diastology LVIDs:         3.20 cm         LV e' medial:    6.89 cm/s LV PW:         0.90 cm         LV E/e' medial:  9.5 LV IVS:        0.70 cm  LV e' lateral:   5.43 cm/s LVOT diam:     2.20 cm         LV E/e' lateral: 12.1 LV SV:         70 LV SV Index:   34              2D LVOT Area:     3.80 cm        Longitudinal                                Strain                                2D Strain GLS  -18.3 %                                (A2C):                                2D Strain GLS  -20.9 %                                (A3C):                                2D Strain GLS  -20.3 %                                (A4C):                                2D Strain GLS  -19.8 %                                Avg:                                 3D Volume EF                                LV 3D EF:    Left                                             ventricular                                             ejection                                             fraction by  3D volume                                             is 59 %.                                 3D Volume EF:                                3D EF:        59 %                                LV EDV:       155 ml                                LV ESV:       64 ml                                LV SV:        91 ml RIGHT VENTRICLE RV Basal diam:  2.60 cm RV S prime:     9.57 cm/s TAPSE (M-mode): 1.7 cm LEFT ATRIUM             Index      RIGHT ATRIUM           Index LA diam:        2.90 cm 1.43 cm/m RA Pressure: 3.00 mmHg LA Vol (A2C):   16.8 ml 8.26 ml/m RA Area:     11.70 cm LA Vol (A4C):   17.7 ml 8.70 ml/m RA Volume:   23.50 ml  11.55 ml/m LA Biplane Vol: 18.0 ml 8.85 ml/m  AORTIC VALVE LVOT Vmax:   104.00 cm/s LVOT Vmean:  59.900 cm/s  LVOT VTI:    0.183 m AI PHT:      498 msec  AORTA Ao Root diam: 3.90 cm Ao Asc diam:  3.90 cm MITRAL VALVE               TRICUSPID VALVE                            Estimated RAP:  3.00 mmHg  MV E velocity: 65.60 cm/s  SHUNTS MV A velocity: 67.70 cm/s  Systemic VTI:  0.18 m MV E/A ratio:  0.97        Systemic Diam: 2.20 cm Eleonore Chiquito MD Electronically signed by Eleonore Chiquito MD Signature Date/Time: 02/21/2020/12:31:10 PM    Final     Impression: 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  Minimal esophageal symptoms at this time.  The patient has had a significant response with the initial course of treatment.  As above he is on immunotherapy and tolerating this well thus far.  Plan: The patient is scheduled to follow up with Dr. Julien Nordmann on 03/18/2020. He will follow up with radiation oncology on an as needed basis in light of his  close follow-up with medical oncology.   ____________________________________   Blair Promise, PhD, MD  This document serves as a record of services personally performed by Gery Pray, MD. It was created on his behalf by Clerance Lav, a trained medical scribe. The creation of this record is based on the scribe's personal observations and the provider's statements to them. This document has been checked and approved by the attending provider.

## 2020-02-26 ENCOUNTER — Ambulatory Visit (INDEPENDENT_AMBULATORY_CARE_PROVIDER_SITE_OTHER): Payer: Medicare Other | Admitting: Internal Medicine

## 2020-02-26 ENCOUNTER — Encounter: Payer: Self-pay | Admitting: Internal Medicine

## 2020-02-26 DIAGNOSIS — J449 Chronic obstructive pulmonary disease, unspecified: Secondary | ICD-10-CM | POA: Diagnosis not present

## 2020-02-26 DIAGNOSIS — R61 Generalized hyperhidrosis: Secondary | ICD-10-CM

## 2020-02-26 DIAGNOSIS — C3492 Malignant neoplasm of unspecified part of left bronchus or lung: Secondary | ICD-10-CM | POA: Diagnosis not present

## 2020-02-26 DIAGNOSIS — F411 Generalized anxiety disorder: Secondary | ICD-10-CM

## 2020-02-26 DIAGNOSIS — I480 Paroxysmal atrial fibrillation: Secondary | ICD-10-CM | POA: Diagnosis not present

## 2020-02-26 NOTE — Assessment & Plan Note (Signed)
Doing OK

## 2020-02-26 NOTE — Assessment & Plan Note (Signed)
XRT, Chemo Dr Suzan Nailer, Dr Sondra Come Feeling better CT-  tumor has shrunk

## 2020-02-26 NOTE — Progress Notes (Signed)
Subjective:  Patient ID: Joe [name], male    DOB: 12-Jun-1945  Age: 74 y.o. MRN: [id number]  CC: Follow-up (3 month f/u)   HPI ERLIN GARDELLA presents for lung cancer - XRT and chemo; CT-  tumor has shrunk F/u COPD, HTN, anxiety  Outpatient Medications Prior to Visit  Medication Sig Dispense Refill  . amLODipine (NORVASC) 5 MG tablet TAKE 1 TABLET BY MOUTH EVERY DAY 90 tablet 2  . atenolol (TENORMIN) 25 MG tablet Take 1 tablet (25 mg total) by mouth daily as needed (for palpitations). 90 tablet 3  . Cholecalciferol (EQL VITAMIN D3) 1000 UNITS tablet Take 1,000 Units by mouth daily.      . diazepam (VALIUM) 5 MG tablet Take 1 tablet (5 mg total) by mouth daily.    [name] ELIQUIS 5 MG TABS tablet TAKE 1 TABLET(5 MG) BY MOUTH TWICE DAILY 180 tablet 1  . levothyroxine (SYNTHROID) 50 MCG tablet TAKE 1 TABLET BY MOUTH EVERY DAY 90 tablet 2  . lovastatin (MEVACOR) 20 MG tablet TAKE 1 TABLET BY MOUTH EVERY NIGHT AT BEDTIME 90 tablet 2  . Multiple Vitamin (MULTIVITAMIN) tablet Take 1 tablet by mouth daily. Centrum Silver.    Vladimir Faster Glycol-Propyl Glycol (SYSTANE) 0.4-0.3 % SOLN Place 1 drop into both eyes daily as needed (Dry eyes). Ultra    . prochlorperazine (COMPAZINE) 10 MG tablet Take 1 tablet (10 mg total) by mouth every 6 (six) hours as needed. (Patient not taking: Reported on 02/26/2020) 30 tablet 2   No facility-administered medications prior to visit.    ROS: Review of Systems  Constitutional: Positive for fatigue. Negative for appetite change and unexpected weight change.  HENT: Negative for congestion, nosebleeds, sneezing, sore throat and trouble swallowing.   Eyes: Negative for itching and visual disturbance.  Respiratory: Positive for shortness of breath. Negative for cough.   Cardiovascular: Negative for chest pain, palpitations and leg swelling.  Gastrointestinal: Negative for abdominal distention, blood in stool, diarrhea and nausea.  Genitourinary: Negative for  frequency and hematuria.  Musculoskeletal: Positive for back pain and gait problem. Negative for joint swelling and neck pain.  Skin: Negative for rash.  Neurological: Negative for dizziness, tremors, speech difficulty and weakness.  Psychiatric/Behavioral: Negative for agitation, dysphoric mood and sleep disturbance. The patient is not nervous/anxious.     Objective:  BP 118/78 (BP Location: Left Arm)   Pulse 67   Temp 97.8 F (36.6 C) (Oral)   Wt 186 lb 3.2 oz (84.5 kg)   SpO2 95%   BMI 25.97 kg/m   BP Readings from Last 3 Encounters:  02/26/20 118/78  02/25/20 131/79  02/19/20 136/82    Wt Readings from Last 3 Encounters:  02/26/20 186 lb 3.2 oz (84.5 kg)  02/25/20 187 lb 6 oz (85 kg)  02/19/20 183 lb 12 oz (83.3 kg)    Physical Exam Constitutional:      General: He is not in acute distress.    Appearance: He is well-developed.     Comments: NAD  Eyes:     Conjunctiva/sclera: Conjunctivae normal.     Pupils: Pupils are equal, round, and reactive to light.  Neck:     Thyroid: No thyromegaly.     Vascular: No JVD.  Cardiovascular:     Rate and Rhythm: Normal rate and regular rhythm.     Heart sounds: Normal heart sounds. No murmur heard.  No friction rub. No gallop.   Pulmonary:     Effort: Pulmonary effort is  normal. No respiratory distress.     Breath sounds: Normal breath sounds. No wheezing or rales.  Chest:     Chest wall: No tenderness.  Abdominal:     General: Bowel sounds are normal. There is no distension.     Palpations: Abdomen is soft. There is no mass.     Tenderness: There is no abdominal tenderness. There is no guarding or rebound.  Musculoskeletal:        General: No tenderness. Normal range of motion.     Cervical back: Normal range of motion.  Lymphadenopathy:     Cervical: No cervical adenopathy.  Skin:    General: Skin is warm and dry.     Findings: No rash.  Neurological:     Mental Status: He is alert and oriented to person, place,  and time.     Cranial Nerves: No cranial nerve deficit.     Motor: Weakness present. No abnormal muscle tone.     Coordination: Coordination normal.     Gait: Gait abnormal.     Deep Tendon Reflexes: Reflexes are normal and symmetric.  Psychiatric:        Behavior: Behavior normal.        Thought Content: Thought content normal.        Judgment: Judgment normal.   cane  Lab Results  Component Value Date   WBC 9.9 02/19/2020   HGB 15.1 02/19/2020   HCT 44.8 02/19/2020   PLT 125 (L) 02/19/2020   GLUCOSE 103 (H) 02/19/2020   CHOL 153 11/23/2018   TRIG 201.0 (H) 11/23/2018   HDL 38.20 (L) 11/23/2018   LDLDIRECT 99.0 11/23/2018   LDLCALC 80 11/07/2017   ALT 29 02/19/2020   AST 24 02/19/2020   NA 140 02/19/2020   K 3.8 02/19/2020   CL 105 02/19/2020   CREATININE 0.76 02/19/2020   BUN 14 02/19/2020   CO2 22 02/19/2020   TSH 3.139 02/19/2020   PSA 0.23 11/23/2018   INR 1.4 (H) 10/15/2019    No results found.  Assessment & Plan:    Walker Kehr, MD

## 2020-02-26 NOTE — Assessment & Plan Note (Signed)
Diazepam prn  Potential benefits of a long term benzodiazepines  use as well as potential risks  and complications were explained to the patient and were aknowledged. 

## 2020-02-26 NOTE — Assessment & Plan Note (Signed)
Resolved

## 2020-02-26 NOTE — Assessment & Plan Note (Signed)
On Eliquis

## 2020-03-18 ENCOUNTER — Encounter: Payer: Self-pay | Admitting: Internal Medicine

## 2020-03-18 ENCOUNTER — Inpatient Hospital Stay: Payer: Medicare Other

## 2020-03-18 ENCOUNTER — Other Ambulatory Visit: Payer: Self-pay

## 2020-03-18 ENCOUNTER — Inpatient Hospital Stay: Payer: Medicare Other | Attending: Hematology | Admitting: Internal Medicine

## 2020-03-18 VITALS — BP 125/83 | HR 63 | Temp 97.9°F | Resp 18 | Ht 71.0 in | Wt 185.8 lb

## 2020-03-18 DIAGNOSIS — E785 Hyperlipidemia, unspecified: Secondary | ICD-10-CM | POA: Insufficient documentation

## 2020-03-18 DIAGNOSIS — C3412 Malignant neoplasm of upper lobe, left bronchus or lung: Secondary | ICD-10-CM | POA: Diagnosis not present

## 2020-03-18 DIAGNOSIS — G4733 Obstructive sleep apnea (adult) (pediatric): Secondary | ICD-10-CM | POA: Insufficient documentation

## 2020-03-18 DIAGNOSIS — Z5112 Encounter for antineoplastic immunotherapy: Secondary | ICD-10-CM | POA: Diagnosis not present

## 2020-03-18 DIAGNOSIS — E039 Hypothyroidism, unspecified: Secondary | ICD-10-CM

## 2020-03-18 DIAGNOSIS — J449 Chronic obstructive pulmonary disease, unspecified: Secondary | ICD-10-CM | POA: Insufficient documentation

## 2020-03-18 DIAGNOSIS — C3492 Malignant neoplasm of unspecified part of left bronchus or lung: Secondary | ICD-10-CM | POA: Diagnosis not present

## 2020-03-18 DIAGNOSIS — Z9989 Dependence on other enabling machines and devices: Secondary | ICD-10-CM | POA: Insufficient documentation

## 2020-03-18 DIAGNOSIS — I480 Paroxysmal atrial fibrillation: Secondary | ICD-10-CM | POA: Insufficient documentation

## 2020-03-18 DIAGNOSIS — Z87891 Personal history of nicotine dependence: Secondary | ICD-10-CM | POA: Insufficient documentation

## 2020-03-18 DIAGNOSIS — Z79899 Other long term (current) drug therapy: Secondary | ICD-10-CM | POA: Insufficient documentation

## 2020-03-18 DIAGNOSIS — I10 Essential (primary) hypertension: Secondary | ICD-10-CM | POA: Diagnosis not present

## 2020-03-18 DIAGNOSIS — K219 Gastro-esophageal reflux disease without esophagitis: Secondary | ICD-10-CM | POA: Insufficient documentation

## 2020-03-18 DIAGNOSIS — R0609 Other forms of dyspnea: Secondary | ICD-10-CM | POA: Insufficient documentation

## 2020-03-18 DIAGNOSIS — R5383 Other fatigue: Secondary | ICD-10-CM | POA: Diagnosis not present

## 2020-03-18 DIAGNOSIS — Z7901 Long term (current) use of anticoagulants: Secondary | ICD-10-CM | POA: Insufficient documentation

## 2020-03-18 DIAGNOSIS — C9110 Chronic lymphocytic leukemia of B-cell type not having achieved remission: Secondary | ICD-10-CM | POA: Diagnosis not present

## 2020-03-18 LAB — CMP (CANCER CENTER ONLY)
ALT: 27 U/L (ref 0–44)
AST: 25 U/L (ref 15–41)
Albumin: 3.6 g/dL (ref 3.5–5.0)
Alkaline Phosphatase: 59 U/L (ref 38–126)
Anion gap: 5 (ref 5–15)
BUN: 13 mg/dL (ref 8–23)
CO2: 29 mmol/L (ref 22–32)
Calcium: 9.9 mg/dL (ref 8.9–10.3)
Chloride: 105 mmol/L (ref 98–111)
Creatinine: 0.72 mg/dL (ref 0.61–1.24)
GFR, Estimated: >60 mL/min (ref >60)
Glucose, Bld: 94 mg/dL (ref 70–99)
Potassium: 4.2 mmol/L (ref 3.5–5.1)
Sodium: 139 mmol/L (ref 135–145)
Total Bilirubin: 1.8 mg/dL — ABNORMAL HIGH (ref 0.3–1.2)
Total Protein: 7 g/dL (ref 6.5–8.1)

## 2020-03-18 LAB — CBC WITH DIFFERENTIAL (CANCER CENTER ONLY)
Abs Immature Granulocytes: 0.03 10*3/uL (ref 0.00–0.07)
Basophils Absolute: 0 10*3/uL (ref 0.0–0.1)
Basophils Relative: 0 %
Eosinophils Absolute: 0.3 10*3/uL (ref 0.0–0.5)
Eosinophils Relative: 3 %
HCT: 42.6 % (ref 39.0–52.0)
Hemoglobin: 14.3 g/dL (ref 13.0–17.0)
Immature Granulocytes: 0 %
Lymphocytes Relative: 17 %
Lymphs Abs: 1.8 10*3/uL (ref 0.7–4.0)
MCH: 30.9 pg (ref 26.0–34.0)
MCHC: 33.6 g/dL (ref 30.0–36.0)
MCV: 92 fL (ref 80.0–100.0)
Monocytes Absolute: 1.4 10*3/uL — ABNORMAL HIGH (ref 0.1–1.0)
Monocytes Relative: 13 %
Neutro Abs: 6.9 10*3/uL (ref 1.7–7.7)
Neutrophils Relative %: 67 %
Platelet Count: 144 10*3/uL — ABNORMAL LOW (ref 150–400)
RBC: 4.63 MIL/uL (ref 4.22–5.81)
RDW: 13 % (ref 11.5–15.5)
WBC Count: 10.5 10*3/uL (ref 4.0–10.5)
nRBC: 0 % (ref 0.0–0.2)

## 2020-03-18 LAB — TSH: TSH: <0.08 u[IU]/mL — ABNORMAL LOW (ref 0.320–4.118)

## 2020-03-18 MED ORDER — SODIUM CHLORIDE 0.9 % IV SOLN
Freq: Once | INTRAVENOUS | Status: AC
  Filled 2020-03-18: qty 250

## 2020-03-18 MED ORDER — SODIUM CHLORIDE 0.9 % IV SOLN
1500.0000 mg | Freq: Once | INTRAVENOUS | Status: AC
  Administered 2020-03-18: 15:00:00 1500 mg via INTRAVENOUS
  Filled 2020-03-18: qty 30

## 2020-03-18 NOTE — Patient Instructions (Signed)
[facility] Discharge Instructions for Patients Receiving Chemotherapy  Today you received the following immunotherapy agent: Durvalumab (Imfinzi)  To help prevent nausea and vomiting after your treatment, we encourage you to take your nausea medication as directed by your MD.   If you develop nausea and vomiting that is not controlled by your nausea medication, call the clinic.   BELOW ARE SYMPTOMS THAT SHOULD BE REPORTED IMMEDIATELY:  *FEVER GREATER THAN 100.5 F  *CHILLS WITH OR WITHOUT FEVER  NAUSEA AND VOMITING THAT IS NOT CONTROLLED WITH YOUR NAUSEA MEDICATION  *UNUSUAL SHORTNESS OF BREATH  *UNUSUAL BRUISING OR BLEEDING  TENDERNESS IN MOUTH AND THROAT WITH OR WITHOUT PRESENCE OF ULCERS  *URINARY PROBLEMS  *BOWEL PROBLEMS  UNUSUAL RASH Items with * indicate a potential emergency and should be followed up as soon as possible.  Feel free to call the clinic should you have any questions or concerns. The clinic phone number is [phone].  Please show the [facility] at check-in to the Emergency Department and triage nurse.

## 2020-03-18 NOTE — Progress Notes (Signed)
[facility] [phone]   [phone]  OFFICE PROGRESS NOTE  [name], [name], MD [place] [id number]  DIAGNOSIS: 1)Stage IIIbnon-small cell lung cancer, squamous cell carcinoma. The patient presented with a left upper lobe lung mass as well as associated mediastinal lymphadenopathy and contralateral right hilar mild hypermetabolism. There was heterogeneous marrow uptake in the spine but with more focal areas at L4 without imaging correlation on CT scan. The patient was diagnosed in August 2021. 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 First dose expected on 12/13/2019. Status post 5 cycles.   Last dose was giving January 14, 2020.  CURRENT THERAPY:  Consolidation treatment with immunotherapy with Imfinzi 1500 mg IV every 4 weeks.  First dose February 19, 2020.  Status post 1 cycle.  INTERVAL HISTORY: Joe [name] 74 y.o. male returns to the clinic today for follow-up visit.  The patient tolerated the first cycle of his treatment with immunotherapy fairly well.  He denied having any skin rash or diarrhea.  He has no itching.  He has no chest pain but continues to have the baseline shortness of breath increased with exertion with mild cough and no hemoptysis.  He has no weight loss or night sweats.  He is here today for evaluation before starting cycle #2.  MEDICAL HISTORY: Past Medical History:  Diagnosis Date  . Alcohol abuse, daily use 08/24/2010   Stopped 2013/05/24   . Arthralgia 2014-01-09   9/15 Not related to statins OA   . Cataract   . 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 CLL (chronic lymphocytic leukemia) ([place]) 08/08/2014   2014-05-24 Dr Burr Medico Stage 0  . COPD mixed type ([place]) 07/26/2007   Smoker - stopped 6/14   . De Quervain's  tenosynovitis, right 01/09/2014   2013/05/24   . Depression    at times  . Dysrhythmia    PAF  . Dysuria 2014-01-09   9/15 - poss stricture Urol ref was offered   . Gallstones 11/16/2017   Asymptomatic Pt refused surg ref  . Generalized anxiety disorder 09/07/2012   Chronic   Potential benefits of a long term steroid  use as well as potential risks  and complications were explained to the patient and were aknowledged.     [name] GERD 12/02/2006   Chronic     . Grief 06-10-16   [name] died in May 24, 2014  . Gynecomastia 2014-01-09   Benign B 05-24-2013   . Hyperlipidemia   . Hypertension   . Hypothyroidism 12/25/2014   May 24, 2014 On Levothyroxine   . Intertrigo 02/02/2012   11/13   . Neoplasm of uncertain behavior of skin 03/12/2013   12/14 R ear, chest   . OSA on CPAP    mild with AHI 9/hr and oxygen desats as low as 75%  . Paresis ([place])    right- s/p cerv decompression  . PERIORBITAL CELLULITIS 02/22/2009   Qualifier: Diagnosis of  By: Diona Browner MD, Amy    . 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  MEDICATIONS:  Current Outpatient Medications  Medication Sig Dispense Refill  . amLODipine (NORVASC) 5 MG tablet TAKE 1 TABLET BY MOUTH EVERY DAY 90 tablet 2  . atenolol (TENORMIN) 25 MG tablet Take  1 tablet (25 mg total) by mouth daily as needed (for palpitations). 90 tablet 3  . Cholecalciferol (EQL VITAMIN D3) 1000 UNITS tablet Take 1,000 Units by mouth daily.      . diazepam (VALIUM) 5 MG tablet Take 1 tablet (5 mg total) by mouth daily.    [name] ELIQUIS 5 MG TABS tablet TAKE 1 TABLET(5 MG) BY MOUTH TWICE DAILY 180 tablet 1  . levothyroxine (SYNTHROID) 50 MCG tablet TAKE 1 TABLET BY MOUTH EVERY DAY 90 tablet 2  . lovastatin (MEVACOR) 20 MG tablet TAKE 1 TABLET BY MOUTH EVERY NIGHT AT BEDTIME 90 tablet 2  . Multiple Vitamin (MULTIVITAMIN) tablet Take 1 tablet by mouth daily. Centrum Silver.    Vladimir Faster Glycol-Propyl Glycol (SYSTANE) 0.4-0.3 % SOLN Place 1 drop into both eyes daily as  needed (Dry eyes). Ultra    . prochlorperazine (COMPAZINE) 10 MG tablet Take 1 tablet (10 mg total) by mouth every 6 (six) hours as needed. (Patient not taking: Reported on 02/26/2020) 30 tablet 2   No current facility-administered medications for this visit.    SURGICAL HISTORY:  Past Surgical History:  Procedure Laterality Date  . BICEPS TENDON REPAIR    . BRONCHIAL BIOPSY  10/23/2019   Procedure: BRONCHIAL BIOPSIES;  Surgeon: [name], MD;  Location[facility] ENDOSCOPY;  Service: Pulmonary;;  . BRONCHIAL BIOPSY  11/20/2019   Procedure: BRONCHIAL BIOPSIES;  Surgeon: [name], MD;  Location: [facility] ENDOSCOPY;  Service: Pulmonary;;  . BRONCHIAL BRUSHINGS  10/23/2019   Procedure: BRONCHIAL BRUSHINGS;  Surgeon: [name], MD;  Location: [facility] ENDOSCOPY;  Service: Pulmonary;;  . BRONCHIAL BRUSHINGS  11/20/2019   Procedure: BRONCHIAL BRUSHINGS;  Surgeon: [name], MD;  Location: [facility] ENDOSCOPY;  Service: Pulmonary;;  . BRONCHIAL NEEDLE ASPIRATION BIOPSY  10/23/2019   Procedure: BRONCHIAL NEEDLE ASPIRATION BIOPSIES;  Surgeon: [name], MD;  Location: [facility] ENDOSCOPY;  Service: Pulmonary;;  . BRONCHIAL NEEDLE ASPIRATION BIOPSY  11/20/2019   Procedure: BRONCHIAL NEEDLE ASPIRATION BIOPSIES;  Surgeon: [name], MD;  Location: [facility];  Service: Pulmonary;;  . BRONCHIAL WASHINGS  11/20/2019   Procedure: BRONCHIAL WASHINGS;  Surgeon: [name], MD;  Location: [facility] ENDOSCOPY;  Service: Pulmonary;;  . CATARACT EXTRACTION Left   . COLONOSCOPY  04-14-99   Dr Flossie Dibble polyp-TA in epic  . POLYPECTOMY  04-14-99  . POSTERIOR LAMINECTOMY / DECOMPRESSION CERVICAL SPINE     Dr Saintclair Halsted  . RETINAL DETACHMENT SURGERY     left eye, 2007 x2, 2008 x 3  . ROTATOR CUFF REPAIR  2004   right  . TONSILLECTOMY  [id number]  . VIDEO BRONCHOSCOPY WITH ENDOBRONCHIAL NAVIGATION N/A 10/23/2019   Procedure: VIDEO BRONCHOSCOPY WITH ENDOBRONCHIAL NAVIGATION;  Surgeon: [name], MD;  Location: [facility]  ENDOSCOPY;  Service: Pulmonary;  Laterality: N/A;  . VIDEO BRONCHOSCOPY WITH ENDOBRONCHIAL NAVIGATION N/A 11/20/2019   Procedure: VIDEO BRONCHOSCOPY WITH ENDOBRONCHIAL NAVIGATION;  Surgeon: [name], MD;  Location: [facility] ENDOSCOPY;  Service: Pulmonary;  Laterality: N/A;  . VIDEO BRONCHOSCOPY WITH ENDOBRONCHIAL ULTRASOUND N/A 10/23/2019   Procedure: VIDEO BRONCHOSCOPY WITH ENDOBRONCHIAL ULTRASOUND;  Surgeon: [name], MD;  Location: [facility] ENDOSCOPY;  Service: Pulmonary;  Laterality: N/A;    REVIEW OF SYSTEMS:  A comprehensive review of systems was negative except for: Constitutional: positive for fatigue Respiratory: positive for dyspnea on exertion   PHYSICAL EXAMINATION: General appearance: alert, cooperative and no distress Head: Normocephalic, without obvious abnormality, atraumatic Neck: no adenopathy, no JVD, supple, symmetrical, trachea  midline and thyroid not enlarged, symmetric, no tenderness/mass/nodules Lymph nodes: Cervical, supraclavicular, and axillary nodes normal. Resp: clear to auscultation bilaterally Back: symmetric, no curvature. ROM normal. No CVA tenderness. Cardio: regular rate and rhythm, S1, S2 normal, no murmur, click, rub or gallop GI: soft, non-tender; bowel sounds normal; no masses,  no organomegaly Extremities: extremities normal, atraumatic, no cyanosis or edema  ECOG PERFORMANCE STATUS: 1 - Symptomatic but completely ambulatory  Blood pressure 125/83, pulse 63, temperature 97.9 F (36.6 C), temperature source Oral, resp. rate 18, height 5\' 11"  (1.803 m), weight 185 lb 12.8 oz (84.3 kg), SpO2 95 %.  LABORATORY DATA: Lab Results  Component Value Date   WBC 10.5 03/18/2020   HGB 14.3 03/18/2020   HCT 42.6 03/18/2020   MCV 92.0 03/18/2020   PLT 144 (L) 03/18/2020      Chemistry      Component Value Date/Time   NA 139 03/18/2020 1312   NA 143 03/10/2017 0911   K 4.2 03/18/2020 1312   K 4.7 03/10/2017 0911   CL 105 03/18/2020 1312   CO2 29  03/18/2020 1312   CO2 30 (H) 03/10/2017 0911   BUN 13 03/18/2020 1312   BUN 10.7 03/10/2017 0911   CREATININE 0.72 03/18/2020 1312   CREATININE 0.8 03/10/2017 0911      Component Value Date/Time   CALCIUM 9.9 03/18/2020 1312   CALCIUM 9.4 03/10/2017 0911   ALKPHOS 59 03/18/2020 1312   ALKPHOS 76 03/10/2017 0911   AST 25 03/18/2020 1312   AST 34 03/10/2017 0911   ALT 27 03/18/2020 1312   ALT 45 03/10/2017 0911   BILITOT 1.8 (H) 03/18/2020 1312   BILITOT 1.41 (H) 03/10/2017 0911       RADIOGRAPHIC STUDIES: ECHOCARDIOGRAM COMPLETE  Result Date: 02/21/2020    ECHOCARDIOGRAM REPORT   Patient Name:   Joe [name] Date of Exam: 02/21/2020 Medical Rec #:  14/04/2019        Height:       71.0 in Accession #:    [id number]       Weight:       183.7 lb Date of Birth:  November 23, 1945       BSA:          2.034 m Patient Age:    73 years         BP:           129/68 mmHg Patient Gender: M                HR:           71 bpm. Exam Location:  [address] Procedure: 2D Echo, 3D Echo, Cardiac Doppler, Color Doppler and Strain Analysis Indications:    I35.1 Aortic insufficiency  History:        Patient has prior history of Echocardiogram examinations, most                 recent 02/13/2019. COPD, Arrythmias:Atrial Fibrillation and                 Atrial Flutter, Signs/Symptoms:Shortness of Breath; Risk                 Factors:Hypertension, Sleep Apnea, Former Smoker and                 Dyslipidemia. EtOH. Lung cancer.  Sonographer:    02/15/2019, RDCS Referring Phys: [phone] TRACI R TURNER IMPRESSIONS  1. Left ventricular ejection fraction, by estimation, is  60 to 65%. Left ventricular ejection fraction by 3D volume is 59 %. The left ventricle has normal function. The left ventricle has no regional wall motion abnormalities. Left ventricular diastolic  parameters were normal. The average left ventricular global longitudinal strain is -19.8 %. The global longitudinal strain is normal.  2. Right ventricular  systolic function is normal. The right ventricular size is normal. Tricuspid regurgitation signal is inadequate for assessing PA pressure.  3. The mitral valve is grossly normal. No evidence of mitral valve regurgitation. No evidence of mitral stenosis.  4. The aortic valve is tricuspid. There is mild calcification of the aortic valve. There is mild thickening of the aortic valve. Aortic valve regurgitation is mild to moderate. No aortic stenosis is present. Aortic regurgitation PHT measures 498 msec.  5. The inferior vena cava is normal in size with greater than 50% respiratory variability, suggesting right atrial pressure of 3 mmHg. Comparison(s): No significant change from prior study. 02/13/19 EF 60-65%. Mild-moderate AI. FINDINGS  Left Ventricle: Left ventricular ejection fraction, by estimation, is 60 to 65%. Left ventricular ejection fraction by 3D volume is 59 %. The left ventricle has normal function. The left ventricle has no regional wall motion abnormalities. The average left ventricular global longitudinal strain is -19.8 %. The global longitudinal strain is normal. The left ventricular internal cavity size was normal in size. There is no left ventricular hypertrophy. Left ventricular diastolic parameters were normal. Right Ventricle: The right ventricular size is normal. No increase in right ventricular wall thickness. Right ventricular systolic function is normal. Tricuspid regurgitation signal is inadequate for assessing PA pressure. Left Atrium: Left atrial size was normal in size. Right Atrium: Right atrial size was normal in size. Pericardium: Trivial pericardial effusion is present. Presence of pericardial fat pad. Mitral Valve: The mitral valve is grossly normal. No evidence of mitral valve regurgitation. No evidence of mitral valve stenosis. Tricuspid Valve: The tricuspid valve is grossly normal. Tricuspid valve regurgitation is not demonstrated. No evidence of tricuspid stenosis. Aortic Valve:  The aortic valve is tricuspid. There is mild calcification of the aortic valve. There is mild thickening of the aortic valve. Aortic valve regurgitation is mild to moderate. Aortic regurgitation PHT measures 498 msec. No aortic stenosis is present. Pulmonic Valve: The pulmonic valve was grossly normal. Pulmonic valve regurgitation is not visualized. No evidence of pulmonic stenosis. Aorta: The aortic root and ascending aorta are structurally normal, with no evidence of dilitation. Venous: The right upper pulmonary vein is normal. The inferior vena cava is normal in size with greater than 50% respiratory variability, suggesting right atrial pressure of 3 mmHg. IAS/Shunts: The atrial septum is grossly normal.  LEFT VENTRICLE PLAX 2D LVIDd:         4.50 cm         Diastology LVIDs:         3.20 cm         LV e' medial:    6.89 cm/s LV PW:         0.90 cm         LV E/e' medial:  9.5 LV IVS:        0.70 cm         LV e' lateral:   5.43 cm/s LVOT diam:     2.20 cm         LV E/e' lateral: 12.1 LV SV:         70 LV SV Index:   34  2D LVOT Area:     3.80 cm        Longitudinal                                Strain                                2D Strain GLS  -18.3 %                                (A2C):                                2D Strain GLS  -20.9 %                                (A3C):                                2D Strain GLS  -20.3 %                                (A4C):                                2D Strain GLS  -19.8 %                                Avg:                                 3D Volume EF                                LV 3D EF:    Left                                             ventricular                                             ejection                                             fraction by                                             3D volume  is 59 %.                                 3D Volume EF:                                3D  EF:        59 %                                LV EDV:       155 ml                                LV ESV:       64 ml                                LV SV:        91 ml RIGHT VENTRICLE RV Basal diam:  2.60 cm RV S prime:     9.57 cm/s TAPSE (M-mode): 1.7 cm LEFT ATRIUM             Index      RIGHT ATRIUM           Index LA diam:        2.90 cm 1.43 cm/m RA Pressure: 3.00 mmHg LA Vol (A2C):   16.8 ml 8.26 ml/m RA Area:     11.70 cm LA Vol (A4C):   17.7 ml 8.70 ml/m RA Volume:   23.50 ml  11.55 ml/m LA Biplane Vol: 18.0 ml 8.85 ml/m  AORTIC VALVE LVOT Vmax:   104.00 cm/s LVOT Vmean:  59.900 cm/s LVOT VTI:    0.183 m AI PHT:      498 msec  AORTA Ao Root diam: 3.90 cm Ao Asc diam:  3.90 cm MITRAL VALVE               TRICUSPID VALVE                            Estimated RAP:  3.00 mmHg  MV E velocity: 65.60 cm/s  SHUNTS MV A velocity: 67.70 cm/s  Systemic VTI:  0.18 m MV E/A ratio:  0.97        Systemic Diam: 2.20 cm Eleonore Chiquito MD Electronically signed by Eleonore Chiquito MD Signature Date/Time: 02/21/2020/12:31:10 PM    Final     ASSESSMENT AND PLAN: This is a very pleasant 73 years old white male with a stage IIIb non-small cell lung cancer, squamous cell carcinoma diagnosed in August of 2021.  The patient also has a history of stage 0 CLL.  He underwent a course of concurrent chemoradiation with weekly carboplatin for AUC of 2 and paclitaxel 45 mg/M2 status post 5 cycles of the treatment.  He tolerated this treatment well with no concerning adverse effect except for mild odynophagia and dysphagia as well as thrombocytopenia. His scan showed improvement of his disease with decrease in the size of the left upper lobe lung mass as well as decrease in the mediastinal lymphadenopathy and no evidence of metastatic disease. The patient is currently undergoing consolidation treatment with immunotherapy with Imfinzi 1500 mg  IV every 4 weeks status post 1 cycle. The patient tolerated the first cycle of his treatment  fairly well. I recommended for the patient to proceed with cycle #2 today as planned. The patient was advised to call immediately if he has any concerning symptoms in the interval. The patient voices understanding of current disease status and treatment options and is in agreement with the current care plan.  All questions were answered. The patient knows to call the clinic with any problems, questions or concerns. We can certainly see the patient much sooner if necessary.  Disclaimer: This note was dictated with voice recognition software. Similar sounding words can inadvertently be transcribed and may not be corrected upon review.

## 2020-03-20 ENCOUNTER — Telehealth: Payer: Self-pay | Admitting: Internal Medicine

## 2020-03-20 NOTE — Telephone Encounter (Signed)
Scheduled per 12/28 los. Pt will receive an updated appt calendar per next visit appt notes

## 2020-03-26 NOTE — Progress Notes (Incomplete)
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[place])   12/10/2019 - 01/14/2020 Chemotherapy   The patient had palonosetron (ALOXI) injection 0.25 mg, 0.25 mg, Intravenous,  Once, 5 of 6 cycles Administration: 0.25 mg (12/10/2019), 0.25 mg (01/14/2020), 0.25 mg (12/17/2019), 0.25 mg (12/24/2019), 0.25 mg (01/01/2020) CARBOplatin (PARAPLATIN) 210 mg in sodium chloride 0.9 % 250 mL chemo infusion, 210 mg (100 % of original dose 212 mg), Intravenous,  Once, 5 of 6 cycles Dose modification: 212 mg (original dose 212 mg, Cycle 1) Administration: 210 mg (12/10/2019), 210 mg (01/14/2020), 210 mg (12/17/2019), 210 mg (12/24/2019), 210 mg (01/01/2020) PACLitaxel (TAXOL) 96 mg in sodium chloride 0.9 % 250 mL chemo infusion (</= 80mg /m2), 45 mg/m2 = 96 mg, Intravenous,  Once, 5 of 6 cycles Administration: 96 mg (12/10/2019), 96 mg (01/14/2020), 96 mg (12/17/2019), 96 mg (12/24/2019), 96 mg (01/01/2020)  for chemotherapy treatment.    02/19/2020 -  Chemotherapy   The patient had durvalumab (IMFINZI) 1,500 mg in sodium chloride 0.9 % 100 mL chemo infusion, 1,500 mg, Intravenous,  Once, 2 of 18 cycles Administration: 1,500 mg (02/19/2020), 1,500 mg (03/18/2020)  for chemotherapy treatment.       CURRENT THERAPY:  Observation for CLL  INTERVAL HISTORY: *** Joe [name] is here for a follow up. He was last seen by me 1 year ago. He presents  to the clinic alone.    REVIEW OF SYSTEMS:  *** Constitutional: Denies fevers, chills or abnormal weight loss Eyes: Denies blurriness of vision Ears, nose, mouth, throat, and face: Denies mucositis or sore throat Respiratory: Denies cough, dyspnea or wheezes Cardiovascular: Denies palpitation, chest discomfort or lower extremity swelling Gastrointestinal:  Denies nausea, heartburn or change in bowel habits Skin: Denies abnormal skin rashes Lymphatics: Denies new lymphadenopathy or easy bruising Neurological:Denies numbness, tingling or new weaknesses Behavioral/Psych: Mood is stable, no new changes  All other systems were reviewed with the patient and are negative.  MEDICAL HISTORY:  Past Medical History:  Diagnosis Date  . Alcohol abuse, daily use 08/24/2010   Stopped 2015   . Arthralgia 12/13/2013   9/15 Not related to statins OA   . Cataract   . Chronic lymphocytic leukemia ([place]) 07-18-2014   chronic stage 1- no symtoms  . CLL (chronic lymphocytic leukemia) ([place]) 08/08/2014   2016 Dr Burr Medico Stage 0  . COPD mixed type ([place]) 07/26/2007   Smoker - stopped 6/14   . De Quervain's tenosynovitis, right 12/13/2013   2015   . Depression    at times  . Dysrhythmia    PAF  . Dysuria 12/13/2013   9/15 - poss stricture Urol ref was offered   . Gallstones 11/16/2017   Asymptomatic Pt refused surg ref  . Generalized anxiety disorder 09/07/2012   Chronic   Potential benefits of a long term steroid  use as well as potential risks  and complications were explained to the patient and were aknowledged.     [name]  GERD 12/02/2006   Chronic     . Grief Jun 03, 2016   [name] died in 06/19/14  . Gynecomastia 01-02-2014   Benign B 2013-06-18   . Hyperlipidemia   . Hypertension   . Hypothyroidism 12/25/2014   06-19-2014 On Levothyroxine   . Intertrigo 02/02/2012   11/13   . Neoplasm of uncertain behavior of skin 03/12/2013   12/14 R ear, chest   . OSA on CPAP    mild with AHI 9/hr and oxygen desats as low as 75%  .  Paresis ([place])    right- s/p cerv decompression  . PERIORBITAL CELLULITIS 02/22/2009   Qualifier: Diagnosis of  By: Diona Browner MD, Amy    . Retinal detachment    L>>R    SURGICAL HISTORY: Past Surgical History:  Procedure Laterality Date  . BICEPS TENDON REPAIR    . BRONCHIAL BIOPSY  10/23/2019   Procedure: BRONCHIAL BIOPSIES;  Surgeon: [name], MD;  Location: [facility] ENDOSCOPY;  Service: Pulmonary;;  . BRONCHIAL BIOPSY  11/20/2019   Procedure: BRONCHIAL BIOPSIES;  Surgeon: [name], MD;  Location: [facility] ENDOSCOPY;  Service: Pulmonary;;  . BRONCHIAL BRUSHINGS  10/23/2019   Procedure: BRONCHIAL BRUSHINGS;  Surgeon: [name], MD;  Location: [facility] ENDOSCOPY;  Service: Pulmonary;;  . BRONCHIAL BRUSHINGS  11/20/2019   Procedure: BRONCHIAL BRUSHINGS;  Surgeon: [name], MD;  Location: [facility] ENDOSCOPY;  Service: Pulmonary;;  . BRONCHIAL NEEDLE ASPIRATION BIOPSY  10/23/2019   Procedure: BRONCHIAL NEEDLE ASPIRATION BIOPSIES;  Surgeon: [name], MD;  Location: [facility] ENDOSCOPY;  Service: Pulmonary;;  . BRONCHIAL NEEDLE ASPIRATION BIOPSY  11/20/2019   Procedure: BRONCHIAL NEEDLE ASPIRATION BIOPSIES;  Surgeon: [name], MD;  Location: [place];  Service: Pulmonary;;  . BRONCHIAL WASHINGS  11/20/2019   Procedure: BRONCHIAL WASHINGS;  Surgeon: [name], MD;  Location: [facility] ENDOSCOPY;  Service: Pulmonary;;  . CATARACT EXTRACTION Left   . COLONOSCOPY  04-14-99   Dr Flossie Dibble polyp-TA in epic  . POLYPECTOMY  04-14-99  . POSTERIOR LAMINECTOMY / DECOMPRESSION CERVICAL SPINE     Dr Saintclair Halsted  . RETINAL DETACHMENT SURGERY     left eye, 06-18-2005 x2, 2006-06-19 x 3  . ROTATOR CUFF REPAIR  06/19/02   right  . TONSILLECTOMY  [id number]  . VIDEO BRONCHOSCOPY WITH ENDOBRONCHIAL NAVIGATION N/A 10/23/2019   Procedure: VIDEO BRONCHOSCOPY WITH ENDOBRONCHIAL NAVIGATION;  Surgeon: [name], MD;  Location: [place] ENDOSCOPY;  Service: Pulmonary;  Laterality: N/A;  . VIDEO BRONCHOSCOPY WITH ENDOBRONCHIAL NAVIGATION  N/A 11/20/2019   Procedure: VIDEO BRONCHOSCOPY WITH ENDOBRONCHIAL NAVIGATION;  Surgeon: [name], MD;  Location: [place] ENDOSCOPY;  Service: Pulmonary;  Laterality: N/A;  . VIDEO BRONCHOSCOPY WITH ENDOBRONCHIAL ULTRASOUND N/A 10/23/2019   Procedure: VIDEO BRONCHOSCOPY WITH ENDOBRONCHIAL ULTRASOUND;  Surgeon: [name], MD;  Location: [place] ENDOSCOPY;  Service: Pulmonary;  Laterality: N/A;    I have reviewed the social history and family history with the patient and they are unchanged from previous note.  ALLERGIES:  has No Known Allergies.  MEDICATIONS:  Current Outpatient Medications  Medication Sig Dispense Refill  . amLODipine (NORVASC) 5 MG tablet TAKE 1 TABLET BY MOUTH EVERY DAY 90 tablet 2  . atenolol (TENORMIN) 25 MG tablet Take 1 tablet (25 mg total) by mouth daily as needed (for palpitations). 90 tablet 3  . Cholecalciferol (EQL VITAMIN D3) 1000 UNITS tablet Take 1,000 Units by mouth daily.      . diazepam (VALIUM) 5 MG tablet Take 1 tablet (5 mg  total) by mouth daily.    [name] ELIQUIS 5 MG TABS tablet TAKE 1 TABLET(5 MG) BY MOUTH TWICE DAILY 180 tablet 1  . levothyroxine (SYNTHROID) 50 MCG tablet TAKE 1 TABLET BY MOUTH EVERY DAY 90 tablet 2  . lovastatin (MEVACOR) 20 MG tablet TAKE 1 TABLET BY MOUTH EVERY NIGHT AT BEDTIME 90 tablet 2  . Multiple Vitamin (MULTIVITAMIN) tablet Take 1 tablet by mouth daily. Centrum Silver.    Vladimir Faster Glycol-Propyl Glycol (SYSTANE) 0.4-0.3 % SOLN Place 1 drop into both eyes daily as needed (Dry eyes). Ultra    . prochlorperazine (COMPAZINE) 10 MG tablet Take 1 tablet (10 mg total) by mouth every 6 (six) hours as needed. (Patient not taking: Reported on 02/26/2020) 30 tablet 2   No current facility-administered medications for this visit.    PHYSICAL EXAMINATION: ECOG PERFORMANCE STATUS: {CHL ONC ECOG [phone]}  There were no vitals filed for this visit. There were no vitals filed for this visit. *** GENERAL:alert, no distress and  comfortable SKIN: skin color, texture, turgor are normal, no rashes or significant lesions EYES: normal, Conjunctiva are pink and non-injected, sclera clear {OROPHARYNX:no exudate, no erythema and lips, buccal mucosa, and tongue normal}  NECK: supple, thyroid normal size, non-tender, without nodularity LYMPH:  no palpable lymphadenopathy in the cervical, axillary {or inguinal} LUNGS: clear to auscultation and percussion with normal breathing effort HEART: regular rate & rhythm and no murmurs and no lower extremity edema ABDOMEN:abdomen soft, non-tender and normal bowel sounds Musculoskeletal:no cyanosis of digits and no clubbing  NEURO: alert & oriented x 3 with fluent speech, no focal motor/sensory deficits  LABORATORY DATA:  I have reviewed the data as listed CBC Latest Ref Rng & Units 03/18/2020 02/19/2020 02/11/2020  WBC 4.0 - 10.5 K/uL 10.5 9.9 9.3  Hemoglobin 13.0 - 17.0 g/dL 14.3 15.1 14.6  Hematocrit 39.0 - 52.0 % 42.6 44.8 43.4  Platelets 150 - 400 K/uL 144(L) 125(L) 125(L)     CMP Latest Ref Rng & Units 03/18/2020 02/19/2020 02/11/2020  Glucose 70 - 99 mg/dL 94 103(H) 102(H)  BUN 8 - 23 mg/dL 13 14 8   Creatinine 0.61 - 1.24 mg/dL 0.72 0.76 0.74  Sodium 135 - 145 mmol/L 139 140 138  Potassium 3.5 - 5.1 mmol/L 4.2 3.8 3.9  Chloride 98 - 111 mmol/L 105 105 106  CO2 22 - 32 mmol/L 29 22 25   Calcium 8.9 - 10.3 mg/dL 9.9 10.2 9.3  Total Protein 6.5 - 8.1 g/dL 7.0 7.3 6.9  Total Bilirubin 0.3 - 1.2 mg/dL 1.8(H) 1.0 1.2  Alkaline Phos 38 - 126 U/L 59 75 73  AST 15 - 41 U/L 25 24 28   ALT 0 - 44 U/L 27 29 31       RADIOGRAPHIC STUDIES: I have personally reviewed the radiological images as listed and agreed with the findings in the report. No results found.   ASSESSMENT & PLAN:  Joe [name] is a 75 y.o. male with    1. CLL, stage 0, Diagnosed in 2016 -Under observation.  -He is clinically doing well. Labs reviewed form today, CBC and CMP WNL except WBC 18.7, Hg  17.4, HCT 53.4%.I reviewed labs from his PCP office in 11/2018. He denies fever or night sweats, no significant weight loss. Physical exam unremarkable. No indication of CLL progression at this time.  -He was recently diagnosed with sleep apnea which can contribute to this, with treatment that can improve.  -Continue surveillance. F/u in 1 year and monitor labs every  6 months.  -I discussed although there is not much data on CLL patients specifically, he should still get COVID vaccine injection. However given his CLL, he may not react to is as indicated to generate antibodies. He is interested in proceeding with vaccine when available. I encouraged him to apply pressure on any needle injections for longer given he is on blood thinner.    2. Stage IIIbnon-small cell lung cancer, squamous cell carcinoma -Diagnosed in 10/2019. He is under the care of Dr Julien Nordmann.    3 HTN, Afib, Sleep Apnea  -f/u with PCP -He was recently diagnosed with Afib. He is on Eliquis, tolerating well.  -He was also recently diagnosed with sleep apnea. Will likely proceed with CPAP machine.   Plan -He is clinically stable. Lab reviewed. Continue surveillance -Lab in 6 months  -Lab and F/u in 1 year     No problem-specific Assessment & Plan notes found for this encounter.   No orders of the defined types were placed in this encounter.  All questions were answered. The patient knows to call the clinic with any problems, questions or concerns. No barriers to learning was detected. The total time spent in the appointment was {CHL ONC TIME VISIT - OFHQR:[id number]}.     Joslyn Devon 03/26/2020   Oneal Deputy, am acting as scribe for Truitt Merle, MD.   {Add scribe attestation statement}

## 2020-03-28 ENCOUNTER — Other Ambulatory Visit: Payer: Medicare Other

## 2020-03-28 ENCOUNTER — Ambulatory Visit: Payer: Medicare Other | Admitting: Hematology

## 2020-04-15 ENCOUNTER — Inpatient Hospital Stay: Payer: Medicare Other

## 2020-04-15 ENCOUNTER — Other Ambulatory Visit: Payer: Self-pay

## 2020-04-15 ENCOUNTER — Inpatient Hospital Stay: Payer: Medicare Other | Attending: Hematology | Admitting: Internal Medicine

## 2020-04-15 ENCOUNTER — Encounter: Payer: Self-pay | Admitting: Internal Medicine

## 2020-04-15 VITALS — BP 139/77 | HR 63 | Temp 97.8°F | Resp 18 | Ht 71.0 in | Wt 192.0 lb

## 2020-04-15 DIAGNOSIS — K219 Gastro-esophageal reflux disease without esophagitis: Secondary | ICD-10-CM | POA: Insufficient documentation

## 2020-04-15 DIAGNOSIS — C3492 Malignant neoplasm of unspecified part of left bronchus or lung: Secondary | ICD-10-CM

## 2020-04-15 DIAGNOSIS — K5900 Constipation, unspecified: Secondary | ICD-10-CM | POA: Diagnosis not present

## 2020-04-15 DIAGNOSIS — I480 Paroxysmal atrial fibrillation: Secondary | ICD-10-CM | POA: Insufficient documentation

## 2020-04-15 DIAGNOSIS — I10 Essential (primary) hypertension: Secondary | ICD-10-CM | POA: Diagnosis not present

## 2020-04-15 DIAGNOSIS — G4733 Obstructive sleep apnea (adult) (pediatric): Secondary | ICD-10-CM | POA: Diagnosis not present

## 2020-04-15 DIAGNOSIS — C3412 Malignant neoplasm of upper lobe, left bronchus or lung: Secondary | ICD-10-CM | POA: Diagnosis not present

## 2020-04-15 DIAGNOSIS — C3490 Malignant neoplasm of unspecified part of unspecified bronchus or lung: Secondary | ICD-10-CM

## 2020-04-15 DIAGNOSIS — Z9989 Dependence on other enabling machines and devices: Secondary | ICD-10-CM | POA: Diagnosis not present

## 2020-04-15 DIAGNOSIS — Z79899 Other long term (current) drug therapy: Secondary | ICD-10-CM | POA: Diagnosis not present

## 2020-04-15 DIAGNOSIS — E039 Hypothyroidism, unspecified: Secondary | ICD-10-CM

## 2020-04-15 DIAGNOSIS — C9110 Chronic lymphocytic leukemia of B-cell type not having achieved remission: Secondary | ICD-10-CM | POA: Diagnosis not present

## 2020-04-15 DIAGNOSIS — E785 Hyperlipidemia, unspecified: Secondary | ICD-10-CM | POA: Diagnosis not present

## 2020-04-15 DIAGNOSIS — J449 Chronic obstructive pulmonary disease, unspecified: Secondary | ICD-10-CM | POA: Insufficient documentation

## 2020-04-15 DIAGNOSIS — R5383 Other fatigue: Secondary | ICD-10-CM | POA: Insufficient documentation

## 2020-04-15 DIAGNOSIS — Z7901 Long term (current) use of anticoagulants: Secondary | ICD-10-CM | POA: Insufficient documentation

## 2020-04-15 DIAGNOSIS — Z5112 Encounter for antineoplastic immunotherapy: Secondary | ICD-10-CM | POA: Insufficient documentation

## 2020-04-15 LAB — CBC WITH DIFFERENTIAL (CANCER CENTER ONLY)
Abs Immature Granulocytes: 0.02 10*3/uL (ref 0.00–0.07)
Basophils Absolute: 0 10*3/uL (ref 0.0–0.1)
Basophils Relative: 1 %
Eosinophils Absolute: 0.2 10*3/uL (ref 0.0–0.5)
Eosinophils Relative: 2 %
HCT: 44.2 % (ref 39.0–52.0)
Hemoglobin: 14.8 g/dL (ref 13.0–17.0)
Immature Granulocytes: 0 %
Lymphocytes Relative: 23 %
Lymphs Abs: 2 10*3/uL (ref 0.7–4.0)
MCH: 30.8 pg (ref 26.0–34.0)
MCHC: 33.5 g/dL (ref 30.0–36.0)
MCV: 92.1 fL (ref 80.0–100.0)
Monocytes Absolute: 0.9 10*3/uL (ref 0.1–1.0)
Monocytes Relative: 11 %
Neutro Abs: 5.6 10*3/uL (ref 1.7–7.7)
Neutrophils Relative %: 63 %
Platelet Count: 143 10*3/uL — ABNORMAL LOW (ref 150–400)
RBC: 4.8 MIL/uL (ref 4.22–5.81)
RDW: 12 % (ref 11.5–15.5)
WBC Count: 8.8 10*3/uL (ref 4.0–10.5)
nRBC: 0 % (ref 0.0–0.2)

## 2020-04-15 LAB — CMP (CANCER CENTER ONLY)
ALT: 23 U/L (ref 0–44)
AST: 21 U/L (ref 15–41)
Albumin: 3.8 g/dL (ref 3.5–5.0)
Alkaline Phosphatase: 65 U/L (ref 38–126)
Anion gap: 7 (ref 5–15)
BUN: 10 mg/dL (ref 8–23)
CO2: 28 mmol/L (ref 22–32)
Calcium: 9.6 mg/dL (ref 8.9–10.3)
Chloride: 106 mmol/L (ref 98–111)
Creatinine: 0.76 mg/dL (ref 0.61–1.24)
GFR, Estimated: >60 mL/min (ref >60)
Glucose, Bld: 92 mg/dL (ref 70–99)
Potassium: 4.1 mmol/L (ref 3.5–5.1)
Sodium: 141 mmol/L (ref 135–145)
Total Bilirubin: 0.8 mg/dL (ref 0.3–1.2)
Total Protein: 7 g/dL (ref 6.5–8.1)

## 2020-04-15 LAB — TSH: TSH: 3.915 u[IU]/mL (ref 0.320–4.118)

## 2020-04-15 MED ORDER — SODIUM CHLORIDE 0.9 % IV SOLN
Freq: Once | INTRAVENOUS | Status: AC
  Filled 2020-04-15: qty 250

## 2020-04-15 MED ORDER — DURVALUMAB 500 MG/10ML IV SOLN
1500.0000 mg | Freq: Once | INTRAVENOUS | Status: AC
  Administered 2020-04-15: 1500 mg via INTRAVENOUS
  Filled 2020-04-15: qty 30

## 2020-04-15 NOTE — Patient Instructions (Signed)
[facility] Discharge Instructions for Patients Receiving Chemotherapy  Today you received the following immunotherapy agent: Durvalumab (Imfinzi)  To help prevent nausea and vomiting after your treatment, we encourage you to take your nausea medication as directed by your MD.   If you develop nausea and vomiting that is not controlled by your nausea medication, call the clinic.   BELOW ARE SYMPTOMS THAT SHOULD BE REPORTED IMMEDIATELY:  *FEVER GREATER THAN 100.5 F  *CHILLS WITH OR WITHOUT FEVER  NAUSEA AND VOMITING THAT IS NOT CONTROLLED WITH YOUR NAUSEA MEDICATION  *UNUSUAL SHORTNESS OF BREATH  *UNUSUAL BRUISING OR BLEEDING  TENDERNESS IN MOUTH AND THROAT WITH OR WITHOUT PRESENCE OF ULCERS  *URINARY PROBLEMS  *BOWEL PROBLEMS  UNUSUAL RASH Items with * indicate a potential emergency and should be followed up as soon as possible.  Feel free to call the clinic should you have any questions or concerns. The clinic phone number is [phone].  Please show the [facility] at check-in to the Emergency Department and triage nurse.

## 2020-04-15 NOTE — Progress Notes (Signed)
[facility] [place] [phone]   Fax:(336) [phone]  OFFICE PROGRESS NOTE  [name], [name], MD [place] [id number]  DIAGNOSIS: 1)Stage IIIbnon-small cell lung cancer, squamous cell carcinoma. The patient presented with a left upper lobe lung mass as well as associated mediastinal lymphadenopathy and contralateral right hilar mild hypermetabolism. There was heterogeneous marrow uptake in the spine but with more focal areas at L4 without imaging correlation on CT scan. The patient was diagnosed in August 2021. 2) CLL stage 0, diagnosed in 06-01-14  PD-L1 expression: 2%  PRIOR THERAPY: Concurrent chemoradiation with carboplatin for an AUC of 2 and paclitaxel 45 mg per metered squared weekly. First dose expected on 12/13/2019. Status post 5 cycles.   Last dose was giving January 14, 2020.  CURRENT THERAPY:  Consolidation treatment with immunotherapy with Imfinzi 1500 mg IV every 4 weeks.  First dose February 19, 2020.  Status post 2 cycles.  INTERVAL HISTORY: Joe [name] 75 y.o. male returns to the clinic today for follow-up visit. The patient is feeling fine today with no concerning complaints except for constipation. He denied having any current chest pain, shortness of breath, cough or hemoptysis. He denied having any fever or chills. He has no nausea, vomiting, diarrhea but has occasional constipation. He denied having any significant weight loss or night sweats. He has no headache or visual changes. The patient is here today for evaluation before starting cycle #3.  MEDICAL HISTORY: Past Medical History:  Diagnosis Date  . Alcohol abuse, daily use 08/24/2010   Stopped 05/31/2013   . Arthralgia 12-16-2013   9/15 Not related to statins OA   . Cataract   . Chronic lymphocytic leukemia ([place]) 07-18-2014   chronic stage 1- no symtoms  . CLL (chronic lymphocytic leukemia) ([place]) 08/08/2014   06/01/2014 Dr Burr Medico Stage 0  . COPD mixed type ([place])  07/26/2007   Smoker - stopped 6/14   . De Quervain's tenosynovitis, right 12/16/13   05/31/2013   . Depression    at times  . Dysrhythmia    PAF  . Dysuria Dec 16, 2013   9/15 - poss stricture Urol ref was offered   . Gallstones 11/16/2017   Asymptomatic Pt refused surg ref  . Generalized anxiety disorder 09/07/2012   Chronic   Potential benefits of a long term steroid  use as well as potential risks  and complications were explained to the patient and were aknowledged.     [name] GERD 12/02/2006   Chronic     . Grief 05-17-16   [name] died in 2014/06/01  . Gynecomastia 12/16/2013   Benign B 05-31-2013   . Hyperlipidemia   . Hypertension   . Hypothyroidism 12/25/2014   01-Jun-2014 On Levothyroxine   . Intertrigo 02/02/2012   11/13   . Neoplasm of uncertain behavior of skin 03/12/2013   12/14 R ear, chest   . OSA on CPAP    mild with AHI 9/hr and oxygen desats as low as 75%  . Paresis ([place])    right- s/p cerv decompression  . PERIORBITAL CELLULITIS 02/22/2009   Qualifier: Diagnosis of  By: Diona Browner MD, Amy    . 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  MEDICATIONS:  Current Outpatient Medications  Medication Sig Dispense Refill  . amLODipine (NORVASC) 5 MG tablet TAKE 1 TABLET BY MOUTH EVERY DAY 90 tablet 2  . atenolol (TENORMIN) 25 MG tablet Take 1  tablet (25 mg total) by mouth daily as needed (for palpitations). 90 tablet 3  . Cholecalciferol (EQL VITAMIN D3) 1000 UNITS tablet Take 1,000 Units by mouth daily.      . diazepam (VALIUM) 5 MG tablet Take 1 tablet (5 mg total) by mouth daily.    [name] ELIQUIS 5 MG TABS tablet TAKE 1 TABLET(5 MG) BY MOUTH TWICE DAILY 180 tablet 1  . levothyroxine (SYNTHROID) 50 MCG tablet TAKE 1 TABLET BY MOUTH EVERY DAY 90 tablet 2  . lovastatin (MEVACOR) 20 MG tablet TAKE 1 TABLET BY MOUTH EVERY NIGHT AT BEDTIME 90 tablet 2  . Multiple Vitamin (MULTIVITAMIN) tablet Take 1 tablet by mouth daily. Centrum Silver.    Vladimir Faster Glycol-Propyl Glycol  (SYSTANE) 0.4-0.3 % SOLN Place 1 drop into both eyes daily as needed (Dry eyes). Ultra    . prochlorperazine (COMPAZINE) 10 MG tablet Take 1 tablet (10 mg total) by mouth every 6 (six) hours as needed. (Patient not taking: Reported on 02/26/2020) 30 tablet 2   No current facility-administered medications for this visit.    SURGICAL HISTORY:  Past Surgical History:  Procedure Laterality Date  . BICEPS TENDON REPAIR    . BRONCHIAL BIOPSY  10/23/2019   Procedure: BRONCHIAL BIOPSIES;  Surgeon: [name], MD;  Location: [facility] ENDOSCOPY;  Service: Pulmonary;;  . BRONCHIAL BIOPSY  11/20/2019   Procedure: BRONCHIAL BIOPSIES;  Surgeon: [name], MD;  Location: [facility] ENDOSCOPY;  Service: Pulmonary;;  . BRONCHIAL BRUSHINGS  10/23/2019   Procedure: BRONCHIAL BRUSHINGS;  Surgeon: [name], MD;  Location: [facility] ENDOSCOPY;  Service: Pulmonary;;  . BRONCHIAL BRUSHINGS  11/20/2019   Procedure: BRONCHIAL BRUSHINGS;  Surgeon: [name], MD;  Location: [facility] ENDOSCOPY;  Service: Pulmonary;;  . BRONCHIAL NEEDLE ASPIRATION BIOPSY  10/23/2019   Procedure: BRONCHIAL NEEDLE ASPIRATION BIOPSIES;  Surgeon: [name], MD;  Location: [facility] ENDOSCOPY;  Service: Pulmonary;;  . BRONCHIAL NEEDLE ASPIRATION BIOPSY  11/20/2019   Procedure: BRONCHIAL NEEDLE ASPIRATION BIOPSIES;  Surgeon: [name], MD;  Location: [place];  Service: Pulmonary;;  . BRONCHIAL WASHINGS  11/20/2019   Procedure: BRONCHIAL WASHINGS;  Surgeon: [name], MD;  Location: [facility] ENDOSCOPY;  Service: Pulmonary;;  . CATARACT EXTRACTION Left   . COLONOSCOPY  04-14-99   Dr Flossie Dibble polyp-TA in epic  . POLYPECTOMY  04-14-99  . POSTERIOR LAMINECTOMY / DECOMPRESSION CERVICAL SPINE     Dr Saintclair Halsted  . RETINAL DETACHMENT SURGERY     left eye, 2007 x2, 2008 x 3  . ROTATOR CUFF REPAIR  2004   right  . TONSILLECTOMY  [id number]  . VIDEO BRONCHOSCOPY WITH ENDOBRONCHIAL NAVIGATION N/A 10/23/2019   Procedure: VIDEO BRONCHOSCOPY WITH ENDOBRONCHIAL  NAVIGATION;  Surgeon: [name], MD;  Location: [facility] ENDOSCOPY;  Service: Pulmonary;  Laterality: N/A;  . VIDEO BRONCHOSCOPY WITH ENDOBRONCHIAL NAVIGATION N/A 11/20/2019   Procedure: VIDEO BRONCHOSCOPY WITH ENDOBRONCHIAL NAVIGATION;  Surgeon: [name], MD;  Location: [place] ENDOSCOPY;  Service: Pulmonary;  Laterality: N/A;  . VIDEO BRONCHOSCOPY WITH ENDOBRONCHIAL ULTRASOUND N/A 10/23/2019   Procedure: VIDEO BRONCHOSCOPY WITH ENDOBRONCHIAL ULTRASOUND;  Surgeon: [name], MD;  Location: [place] ENDOSCOPY;  Service: Pulmonary;  Laterality: N/A;    REVIEW OF SYSTEMS:  A comprehensive review of systems was negative except for: Constitutional: positive for fatigue Gastrointestinal: positive for constipation   PHYSICAL EXAMINATION: General appearance: alert, cooperative and no distress Head: Normocephalic, without obvious abnormality, atraumatic Neck: no adenopathy, no JVD, supple, symmetrical, trachea midline and thyroid  not enlarged, symmetric, no tenderness/mass/nodules Lymph nodes: Cervical, supraclavicular, and axillary nodes normal. Resp: clear to auscultation bilaterally Back: symmetric, no curvature. ROM normal. No CVA tenderness. Cardio: regular rate and rhythm, S1, S2 normal, no murmur, click, rub or gallop GI: soft, non-tender; bowel sounds normal; no masses,  no organomegaly Extremities: extremities normal, atraumatic, no cyanosis or edema  ECOG PERFORMANCE STATUS: 1 - Symptomatic but completely ambulatory  Blood pressure 139/77, pulse 63, temperature 97.8 F (36.6 C), temperature source Tympanic, resp. rate 18, height $RemoveBe'5\' 11"'yvgCYGLIE$  (1.803 m), weight 192 lb (87.1 kg), SpO2 92 %.  LABORATORY DATA: Lab Results  Component Value Date   WBC 8.8 04/15/2020   HGB 14.8 04/15/2020   HCT 44.2 04/15/2020   MCV 92.1 04/15/2020   PLT 143 (L) 04/15/2020      Chemistry      Component Value Date/Time   NA 139 03/18/2020 1312   NA 143 03/10/2017 0911   K 4.2 03/18/2020 1312   K 4.7  03/10/2017 0911   CL 105 03/18/2020 1312   CO2 29 03/18/2020 1312   CO2 30 (H) 03/10/2017 0911   BUN 13 03/18/2020 1312   BUN 10.7 03/10/2017 0911   CREATININE 0.72 03/18/2020 1312   CREATININE 0.8 03/10/2017 0911      Component Value Date/Time   CALCIUM 9.9 03/18/2020 1312   CALCIUM 9.4 03/10/2017 0911   ALKPHOS 59 03/18/2020 1312   ALKPHOS 76 03/10/2017 0911   AST 25 03/18/2020 1312   AST 34 03/10/2017 0911   ALT 27 03/18/2020 1312   ALT 45 03/10/2017 0911   BILITOT 1.8 (H) 03/18/2020 1312   BILITOT 1.41 (H) 03/10/2017 0911       RADIOGRAPHIC STUDIES: No results found.  ASSESSMENT AND PLAN: This is a very pleasant 75 years old white male with a stage IIIb non-small cell lung cancer, squamous cell carcinoma diagnosed in August of 2021.  The patient also has a history of stage 0 CLL.  He underwent a course of concurrent chemoradiation with weekly carboplatin for AUC of 2 and paclitaxel 45 mg/M2 status post 5 cycles of the treatment.  He tolerated this treatment well with no concerning adverse effect except for mild odynophagia and dysphagia as well as thrombocytopenia. His scan showed improvement of his disease with decrease in the size of the left upper lobe lung mass as well as decrease in the mediastinal lymphadenopathy and no evidence of metastatic disease. The patient is currently undergoing consolidation treatment with immunotherapy with Imfinzi 1500 mg IV every 4 weeks status post 2 cycles. He continues to tolerate this treatment well with no concerning adverse effect except for intermittent constipation improved with Colace. I recommended for the patient to proceed with cycle #3 today as planned. He will come back for follow-up visit in 3 weeks for evaluation with repeat CT scan of the chest for restaging of his disease. For the laboratory hyperthyroidism with significantly decreased TSH, he is currently on levothyroxine 50 mcg p.o. daily. I recommended for the patient to  discuss with Dr. Alain Marion reducing his dose of levothyroxine. The patient was advised to call immediately if he has any other concerning symptoms in the interval. The patient voices understanding of current disease status and treatment options and is in agreement with the current care plan.  All questions were answered. The patient knows to call the clinic with any problems, questions or concerns. We can certainly see the patient much sooner if necessary.  Disclaimer: This note was dictated  with voice recognition software. Similar sounding words can inadvertently be transcribed and may not be corrected upon review.

## 2020-05-09 NOTE — Progress Notes (Signed)
[name] OFFICE PROGRESS NOTE  [name], [name], MD [place] [id number]  DIAGNOSIS:  1)Stage IIIbnon-small cell lung cancer, squamous cell carcinoma. The patient presented with a left upper lobe lung mass as well as associated mediastinal lymphadenopathy and contralateral right hilar mild hypermetabolism. There was heterogeneous marrow uptake in the spine but with more focal areas at L4 without imaging correlation on CT scan. The patient was diagnosed in August 2021. 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 First dose expected on 12/13/2019.Status post 5 cycles.  Last dose was giving January 14, 2020.  CURRENT THERAPY: Consolidation treatment with immunotherapy with Imfinzi 1500 mg IV every 4 weeks.  First dose February 19, 2020.  Status post 3 cycles.  INTERVAL HISTORY: Joe [name] 75 y.o. male returns to the clinic for a follow up visit. The patient is feeling well today without any concerning complaints except he is wondering if he should schedule his repeat colonoscopy. He is due for a repeat colonoscopy by [facility]. He has a history of polyps. He endorses history of straining/constipation and hemorrhoids. He also notes abdominal bloating which he notices is associated with certain dietary habits such as dairy products. He also reports weight gain recently. He is trying to stay active with walking a mile periodically. Otherwise, he mentions he ha a follow up with his cardiologist on 05/26/20 for his history of atrial fibrillation.   The patient continues to tolerate treatment with consolidation immunotherapy with Imfinzi well without any adverse side effects. Denies any fever, chills, night sweats, or weight loss. Denies any chest pain, shortness of breath, cough, or hemoptysis. Denies any nausea,  vomiting, diarrhea. Denies any headache or visual changes. Denies any rashes or skin changes. The patient recently had a restaging CT scan performed. The patient is here today for evaluation and to review the scan results prior to starting cycle # 4   MEDICAL HISTORY: Past Medical History:  Diagnosis Date  . Alcohol abuse, daily use 08/24/2010   Stopped 2013-05-05   . Arthralgia 2013-12-21   9/15 Not related to statins OA   . Cataract   . 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 CLL (chronic lymphocytic leukemia) ([place]) 08/08/2014   2014-05-05 Dr Burr Medico Stage 0  . COPD mixed type ([place]) 07/26/2007   Smoker - stopped 6/14   . De Quervain's tenosynovitis, right 21-Dec-2013   05/05/13   . Depression    at times  . Dysrhythmia    PAF  . Dysuria 12/21/2013   9/15 - poss stricture Urol ref was offered   . Gallstones 11/16/2017   Asymptomatic Pt refused surg ref  . Generalized anxiety disorder 09/07/2012   Chronic   Potential benefits of a long term steroid  use as well as potential risks  and complications were explained to the patient and were aknowledged.     [name] GERD 12/02/2006   Chronic     . Grief 2016/05/22   [name] died in 05-May-2014  . Gynecomastia 12/21/2013   Benign B 2013/05/05   . Hyperlipidemia   . Hypertension   . Hypothyroidism 12/25/2014   05/05/14 On Levothyroxine   . Intertrigo 02/02/2012   11/13   . Neoplasm of uncertain behavior of skin 03/12/2013   12/14 R ear, chest   .  OSA on CPAP    mild with AHI 9/hr and oxygen desats as low as 75%  . Paresis ([place])    right- s/p cerv decompression  . PERIORBITAL CELLULITIS 02/22/2009   Qualifier: Diagnosis of  By: Diona Browner MD, Amy    . 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  MEDICATIONS:  Current Outpatient Medications  Medication Sig Dispense Refill  . amLODipine (NORVASC) 5 MG tablet TAKE 1 TABLET BY MOUTH EVERY DAY 90 tablet 2  . atenolol (TENORMIN) 25 MG tablet Take 1 tablet (25 mg total) by  mouth daily as needed (for palpitations). 90 tablet 3  . Cholecalciferol 25 MCG (1000 UT) tablet Take 1,000 Units by mouth daily.    . diazepam (VALIUM) 5 MG tablet Take 1 tablet (5 mg total) by mouth daily.    [name] docusate sodium (COLACE) 100 MG capsule Take 100 mg by mouth 2 (two) times daily.    [name] ELIQUIS 5 MG TABS tablet TAKE 1 TABLET(5 MG) BY MOUTH TWICE DAILY 180 tablet 1  . lovastatin (MEVACOR) 20 MG tablet TAKE 1 TABLET BY MOUTH EVERY NIGHT AT BEDTIME 90 tablet 2  . Multiple Vitamin (MULTIVITAMIN) tablet Take 1 tablet by mouth daily. Centrum Silver.    Vladimir Faster Glycol-Propyl Glycol (SYSTANE) 0.4-0.3 % SOLN Place 1 drop into both eyes daily as needed (Dry eyes). Ultra    . prochlorperazine (COMPAZINE) 10 MG tablet Take 1 tablet (10 mg total) by mouth every 6 (six) hours as needed. 30 tablet 2  . levothyroxine (SYNTHROID) 75 MCG tablet Take 1 tablet (75 mcg total) by mouth daily. 90 tablet 2   No current facility-administered medications for this visit.    SURGICAL HISTORY:  Past Surgical History:  Procedure Laterality Date  . BICEPS TENDON REPAIR    . BRONCHIAL BIOPSY  10/23/2019   Procedure: BRONCHIAL BIOPSIES;  Surgeon: [name], MD;  Location: [facility] ENDOSCOPY;  Service: Pulmonary;;  . BRONCHIAL BIOPSY  11/20/2019   Procedure: BRONCHIAL BIOPSIES;  Surgeon: [name], MD;  Location: [facility] ENDOSCOPY;  Service: Pulmonary;;  . BRONCHIAL BRUSHINGS  10/23/2019   Procedure: BRONCHIAL BRUSHINGS;  Surgeon: [name], MD;  Location[facility] ENDOSCOPY;  Service: Pulmonary;;  . BRONCHIAL BRUSHINGS  11/20/2019   Procedure: BRONCHIAL BRUSHINGS;  Surgeon: [name], MD;  Location: [facility] ENDOSCOPY;  Service: Pulmonary;;  . BRONCHIAL NEEDLE ASPIRATION BIOPSY  10/23/2019   Procedure: BRONCHIAL NEEDLE ASPIRATION BIOPSIES;  Surgeon: [name], MD;  Location: [facility] ENDOSCOPY;  Service: Pulmonary;;  . BRONCHIAL NEEDLE ASPIRATION BIOPSY  11/20/2019   Procedure: BRONCHIAL NEEDLE ASPIRATION  BIOPSIES;  Surgeon: [name], MD;  Location: [place];  Service: Pulmonary;;  . BRONCHIAL WASHINGS  11/20/2019   Procedure: BRONCHIAL WASHINGS;  Surgeon: [name], MD;  Location: [facility] ENDOSCOPY;  Service: Pulmonary;;  . CATARACT EXTRACTION Left   . COLONOSCOPY  04-14-99   Dr Flossie Dibble polyp-TA in epic  . POLYPECTOMY  04-14-99  . POSTERIOR LAMINECTOMY / DECOMPRESSION CERVICAL SPINE     Dr Saintclair Halsted  . RETINAL DETACHMENT SURGERY     left eye, 2007 x2, 2008 x 3  . ROTATOR CUFF REPAIR  2004   right  . TONSILLECTOMY  [id number]  . VIDEO BRONCHOSCOPY WITH ENDOBRONCHIAL NAVIGATION N/A 10/23/2019   Procedure: VIDEO BRONCHOSCOPY WITH ENDOBRONCHIAL NAVIGATION;  Surgeon: [name], MD;  Location: [facility] ENDOSCOPY;  Service: Pulmonary;  Laterality: N/A;  . VIDEO BRONCHOSCOPY WITH ENDOBRONCHIAL NAVIGATION  N/A 11/20/2019   Procedure: VIDEO BRONCHOSCOPY WITH ENDOBRONCHIAL NAVIGATION;  Surgeon: [name], MD;  Location: [facility] ENDOSCOPY;  Service: Pulmonary;  Laterality: N/A;  . VIDEO BRONCHOSCOPY WITH ENDOBRONCHIAL ULTRASOUND N/A 10/23/2019   Procedure: VIDEO BRONCHOSCOPY WITH ENDOBRONCHIAL ULTRASOUND;  Surgeon: [name], MD;  Location: [place] ENDOSCOPY;  Service: Pulmonary;  Laterality: N/A;    REVIEW OF SYSTEMS:   Review of Systems  Constitutional: Positive for weight gain. Negative for appetite change, chills, fatigue, fever.  HENT: Negative for mouth sores, nosebleeds, sore throat and trouble swallowing.   Eyes: Negative for eye problems and icterus.  Respiratory: Negative for cough, hemoptysis, shortness of breath and wheezing.   Cardiovascular: Negative for chest pain and leg swelling.  Gastrointestinal: Positive for frequent constipation and occasional bloating. Negative for diarrhea, nausea and vomiting.  Genitourinary: Negative for bladder incontinence, difficulty urinating, dysuria, frequency and hematuria.   Musculoskeletal: Negative for back pain, gait problem, neck pain and neck  stiffness.  Skin: Negative for itching and rash.  Neurological: Negative for dizziness, extremity weakness, gait problem, headaches, light-headedness and seizures.  Hematological: Negative for adenopathy. Does not bruise/bleed easily.  Psychiatric/Behavioral: Negative for confusion, depression and sleep disturbance. The patient is not nervous/anxious.     PHYSICAL EXAMINATION:  Blood pressure 111/68, pulse 60, temperature (!) 97.2 F (36.2 C), temperature source Tympanic, resp. rate 16, height _0  (1.803 m), weight 198 lb (89.8 kg), SpO2 97 %.  ECOG PERFORMANCE STATUS: 1 - Symptomatic but completely ambulatory  Physical Exam  Constitutional: Oriented to person, place, and time and well-developed, well-nourished, and in no distress. HENT:  Head: Normocephalic and atraumatic.  Mouth/Throat: Oropharynx is clear and moist. No oropharyngeal exudate.  Eyes: Conjunctivae are normal. Right eye exhibits no discharge. Left eye exhibits no discharge. No scleral icterus.  Neck: Normal range of motion. Neck supple.  Cardiovascular: Normal rate, regular rhythm, normal heart sounds and intact distal pulses.   Pulmonary/Chest: Effort normal. Quiet breath sounds in all lung fields. No respiratory distress. No wheezes. No rales.  Abdominal: Soft. Bowel sounds are normal. Exhibits no distension and no mass. There is no tenderness.  Musculoskeletal: Normal range of motion. Exhibits no edema.  Lymphadenopathy:    No cervical adenopathy.  Neurological: Alert and oriented to person, place, and time. Exhibits normal muscle tone. Gait normal. Coordination normal.  Skin: Skin is warm and dry. No rash noted. Not diaphoretic. No erythema. No pallor.  Psychiatric: Mood, memory and judgment normal.  Vitals reviewed.  LABORATORY DATA: Lab Results  Component Value Date   WBC 10.0 05/14/2020   HGB 14.9 05/14/2020   HCT 45.6 05/14/2020   MCV 91.9 05/14/2020   PLT 157 05/14/2020      Chemistry       Component Value Date/Time   NA 138 05/14/2020 1050   NA 143 03/10/2017 0911   K 4.1 05/14/2020 1050   K 4.7 03/10/2017 0911   CL 103 05/14/2020 1050   CO2 26 05/14/2020 1050   CO2 30 (H) 03/10/2017 0911   BUN 9 05/14/2020 1050   BUN 10.7 03/10/2017 0911   CREATININE 0.78 05/14/2020 1050   CREATININE 0.8 03/10/2017 0911      Component Value Date/Time   CALCIUM 9.6 05/14/2020 1050   CALCIUM 9.4 03/10/2017 0911   ALKPHOS 67 05/14/2020 1050   ALKPHOS 76 03/10/2017 0911   AST 23 05/14/2020 1050   AST 34 03/10/2017 0911   ALT 25 05/14/2020 1050   ALT 45 03/10/2017 0911  BILITOT 1.1 05/14/2020 1050   BILITOT 1.41 (H) 03/10/2017 0911       RADIOGRAPHIC STUDIES:  CT Chest W Contrast  Result Date: 05/13/2020 CLINICAL DATA:  Primary Cancer Type: Lung Imaging Indication: Assess response to therapy Interval therapy since last imaging? Yes Initial Cancer Diagnosis Date: 11/20/2019; Established by: Biopsy-proven Detailed Pathology: Stage IIIb non-small cell lung cancer, squamous cell carcinoma. Primary Tumor location: Left upper lobe. Surgeries: No. Chemotherapy: Yes; Ongoing? No; Most recent administration: 01/14/2020 Immunotherapy?  Yes; Type: Imfinzi; Ongoing? Yes Radiation therapy? Yes; Date Range: 12/10/2019 - 01/21/2020; Target: Left lung Other Cancers: Chronic lymphocytic leukemia, Stage 0. EXAM: CT CHEST WITH CONTRAST TECHNIQUE: Multidetector CT imaging of the chest was performed during intravenous contrast administration. CONTRAST:  46m OMNIPAQUE IOHEXOL 300 MG/ML  SOLN COMPARISON:  Most recent CT chest 02/11/2020.  11/09/2019 PET-CT. FINDINGS: Cardiovascular: Aortic atherosclerosis. Normal heart size. Left coronary artery calcifications. No pericardial effusion. Mediastinum/Nodes: Unchanged prominent pretracheal and AP window lymph nodes, largest measuring 1.3 x 0.9 cm (series 2, image 51). Thyroid gland, trachea, and esophagus demonstrate no significant findings. Lungs/Pleura: Slight  interval decrease in size of a spiculated mass of the suprahilar left upper lobe, dominant component measuring 3.3 x 2.3 cm, previously 3.5 x 2.5 cm (series 5, image 40). Stable 6 mm pulmonary nodule of the anterior right upper lobe (series 5, image 70). Mild bibasilar predominant pulmonary fibrosis featuring irregular peripheral interstitial opacity and septal thickening, without clear evidence of bronchiolectasis. Moderate centrilobular and paraseptal emphysema. No pleural effusion or pneumothorax. Upper Abdomen: No acute abnormality. Hepatic steatosis. Gallstones in the gallbladder. Musculoskeletal: No chest wall mass or suspicious bone lesions identified. IMPRESSION: 1. Slight interval decrease in size of a spiculated mass of the suprahilar left upper lobe, consistent with treatment response. 2. Stable 6 mm pulmonary nodule of the anterior right upper lobe, nonspecific. Attention on follow-up. 3. Unchanged prominent pretracheal and AP window lymph nodes. 4. Mild bibasilar predominant pulmonary fibrosis featuring irregular peripheral interstitial opacity and septal thickening, without clear evidence of bronchiolectasis, unchanged. 5. Emphysema. 6. Coronary artery disease. 7. Hepatic steatosis. 8. Cholelithiasis. Aortic Atherosclerosis (ICD10-I70.0) and Emphysema (ICD10-J43.9). Electronically Signed   By: AEddie CandleM.D.   On: 05/13/2020 10:31     ASSESSMENT/PLAN:  This is a very pleasant 75year old Caucasian male referred to the clinic for evaluation of stage IIIb non-small cell lung cancer, squamous cell carcinoma. The patient presented with a left upper lobe lung mass as well as associated mediastinal lymphadenopathy and contralateral right hilar mild hypermetabolism. There was heterogeneous marrow uptake in general in the spine but with more focal areas on the L4 level without imaging correlation on CT.His PDL1 expression is 2%.He was diagnosed in August 2021. The patient also has a  stage 0 CLL.    He underwent a course of concurrent chemoradiation with weekly carboplatin for AUC of 2 and paclitaxel 45 mg/M2 status post 5 cycles of the treatment.  He tolerated this treatment well with no concerning adverse effect except for mild odynophagia and dysphagia as well as thrombocytopenia.  The patient is currently undergoing consolidation treatment with immunotherapy with Imfinzi 1500 mg IV every 4 weeks status post 3 cycles.  The patient recently had a restaging CT scan performed. Dr. MJulien Nordmannpersonally and independently reviewed the scan and discussed the patient. The scan showed no evidence for disease progression. Recommend that he proceed with cycle #4 today as scheduled.   We will see him back for a follow up visit in 4 weeks  for evaluation before starting cycle #5.   His TSH is significantly elevated from his last labs 4 weeks ago which likely explains his weight gain. His dose of synthroid was increased from 50 mcg to 75 mcg.   He will follow with GI to arrange for his repeat colonoscopy. He will use stool softener for his frequent constipation.   He will follow with cardiology regarding his atrial fibrillation.   The patient was advised to call immediately if she has any concerning symptoms in the interval. The patient voices understanding of current disease status and treatment options and is in agreement with the current care plan. All questions were answered. The patient knows to call the clinic with any problems, questions or concerns. We can certainly see the patient much sooner if necessary    No orders of the defined types were placed in this encounter.    I spent 20-29 minutes in this encounter.   Marce Charlesworth L Gilman Olazabal, PA-C 05/14/20  ADDENDUM: Hematology/Oncology Attending: I had a face-to-face encounter with the patient today.  I reviewed his records, scan and recommended his care plan.  This is a very pleasant 75 years old white male with a stage IIIb  non-small cell lung cancer, squamous cell carcinoma diagnosed in August 2021 status post a course of concurrent chemoradiation with weekly carboplatin and paclitaxel with partial response.  The patient is currently undergoing consolidation treatment with immunotherapy with Imfinzi 1500 mg IV every 4 weeks status post 3 cycles.  He has been tolerating this treatment well with no concerning adverse effects. The patient had repeat CT scan of the chest performed recently.  I personally and independently reviewed the scans and discussed the results with the patient today. His scan showed no concerning findings for disease progression. I recommended for him to proceed with cycle #4 today as planned. For the hypothyroidism, his TSH is elevated today.  I will increase his dose of levothyroxine to 75 mcg p.o. daily and will continue to monitor his TSH closely. For the screening purposes The patient will follow up with gastroenterology for repeat colonoscopy. He will come back for follow-up visit in 4 weeks for evaluation before starting cycle #5 of his treatment. The patient was advised to call immediately if he has any other concerning symptoms in the interval.  Disclaimer: This note was dictated with voice recognition software. Similar sounding words can inadvertently be transcribed and may be missed upon review. Eilleen Kempf, MD 05/14/20

## 2020-05-12 ENCOUNTER — Ambulatory Visit (HOSPITAL_COMMUNITY)
Admission: RE | Admit: 2020-05-12 | Discharge: 2020-05-12 | Disposition: A | Discharge status: Home / Self Care | Payer: Medicare Other | Source: Ambulatory Visit | Attending: Internal Medicine | Admitting: Internal Medicine

## 2020-05-12 ENCOUNTER — Encounter (HOSPITAL_COMMUNITY): Payer: Self-pay

## 2020-05-12 ENCOUNTER — Other Ambulatory Visit: Payer: Self-pay

## 2020-05-12 DIAGNOSIS — J432 Centrilobular emphysema: Secondary | ICD-10-CM | POA: Diagnosis not present

## 2020-05-12 DIAGNOSIS — C3490 Malignant neoplasm of unspecified part of unspecified bronchus or lung: Secondary | ICD-10-CM | POA: Diagnosis not present

## 2020-05-12 DIAGNOSIS — I700 Atherosclerosis of aorta: Secondary | ICD-10-CM | POA: Diagnosis not present

## 2020-05-12 DIAGNOSIS — I2510 Atherosclerotic heart disease of native coronary artery without angina pectoris: Secondary | ICD-10-CM | POA: Diagnosis not present

## 2020-05-12 DIAGNOSIS — C3412 Malignant neoplasm of upper lobe, left bronchus or lung: Secondary | ICD-10-CM | POA: Diagnosis not present

## 2020-05-12 MED ORDER — IOHEXOL 300 MG/ML  SOLN
75.0000 mL | Freq: Once | INTRAMUSCULAR | Status: AC | PRN
  Administered 2020-05-12: 75 mL via INTRAVENOUS

## 2020-05-14 ENCOUNTER — Inpatient Hospital Stay: Payer: Medicare Other

## 2020-05-14 ENCOUNTER — Inpatient Hospital Stay: Payer: Medicare Other | Attending: Hematology

## 2020-05-14 ENCOUNTER — Inpatient Hospital Stay (HOSPITAL_BASED_OUTPATIENT_CLINIC_OR_DEPARTMENT_OTHER): Payer: Medicare Other | Admitting: Physician Assistant

## 2020-05-14 ENCOUNTER — Other Ambulatory Visit: Payer: Self-pay

## 2020-05-14 ENCOUNTER — Encounter: Payer: Self-pay | Admitting: Physician Assistant

## 2020-05-14 VITALS — BP 111/68 | HR 60 | Temp 97.2°F | Resp 16 | Ht 71.0 in | Wt 198.0 lb

## 2020-05-14 DIAGNOSIS — C3492 Malignant neoplasm of unspecified part of left bronchus or lung: Secondary | ICD-10-CM

## 2020-05-14 DIAGNOSIS — Z923 Personal history of irradiation: Secondary | ICD-10-CM | POA: Insufficient documentation

## 2020-05-14 DIAGNOSIS — I4891 Unspecified atrial fibrillation: Secondary | ICD-10-CM | POA: Diagnosis not present

## 2020-05-14 DIAGNOSIS — Z5112 Encounter for antineoplastic immunotherapy: Secondary | ICD-10-CM

## 2020-05-14 DIAGNOSIS — Z79899 Other long term (current) drug therapy: Secondary | ICD-10-CM | POA: Diagnosis not present

## 2020-05-14 DIAGNOSIS — D6959 Other secondary thrombocytopenia: Secondary | ICD-10-CM | POA: Diagnosis not present

## 2020-05-14 DIAGNOSIS — Z9221 Personal history of antineoplastic chemotherapy: Secondary | ICD-10-CM | POA: Insufficient documentation

## 2020-05-14 DIAGNOSIS — K5900 Constipation, unspecified: Secondary | ICD-10-CM | POA: Insufficient documentation

## 2020-05-14 DIAGNOSIS — E039 Hypothyroidism, unspecified: Secondary | ICD-10-CM | POA: Diagnosis not present

## 2020-05-14 DIAGNOSIS — R946 Abnormal results of thyroid function studies: Secondary | ICD-10-CM | POA: Diagnosis not present

## 2020-05-14 DIAGNOSIS — C9110 Chronic lymphocytic leukemia of B-cell type not having achieved remission: Secondary | ICD-10-CM | POA: Insufficient documentation

## 2020-05-14 DIAGNOSIS — R1319 Other dysphagia: Secondary | ICD-10-CM | POA: Diagnosis not present

## 2020-05-14 DIAGNOSIS — C3412 Malignant neoplasm of upper lobe, left bronchus or lung: Secondary | ICD-10-CM | POA: Diagnosis not present

## 2020-05-14 LAB — CBC WITH DIFFERENTIAL (CANCER CENTER ONLY)
Abs Immature Granulocytes: 0.05 10*3/uL (ref 0.00–0.07)
Basophils Absolute: 0.1 10*3/uL (ref 0.0–0.1)
Basophils Relative: 1 %
Eosinophils Absolute: 0.4 10*3/uL (ref 0.0–0.5)
Eosinophils Relative: 4 %
HCT: 45.6 % (ref 39.0–52.0)
Hemoglobin: 14.9 g/dL (ref 13.0–17.0)
Immature Granulocytes: 1 %
Lymphocytes Relative: 22 %
Lymphs Abs: 2.2 10*3/uL (ref 0.7–4.0)
MCH: 30 pg (ref 26.0–34.0)
MCHC: 32.7 g/dL (ref 30.0–36.0)
MCV: 91.9 fL (ref 80.0–100.0)
Monocytes Absolute: 0.9 10*3/uL (ref 0.1–1.0)
Monocytes Relative: 9 %
Neutro Abs: 6.4 10*3/uL (ref 1.7–7.7)
Neutrophils Relative %: 63 %
Platelet Count: 157 10*3/uL (ref 150–400)
RBC: 4.96 MIL/uL (ref 4.22–5.81)
RDW: 12.7 % (ref 11.5–15.5)
WBC Count: 10 10*3/uL (ref 4.0–10.5)
nRBC: 0 % (ref 0.0–0.2)

## 2020-05-14 LAB — CMP (CANCER CENTER ONLY)
ALT: 25 U/L (ref 0–44)
AST: 23 U/L (ref 15–41)
Albumin: 4 g/dL (ref 3.5–5.0)
Alkaline Phosphatase: 67 U/L (ref 38–126)
Anion gap: 9 (ref 5–15)
BUN: 9 mg/dL (ref 8–23)
CO2: 26 mmol/L (ref 22–32)
Calcium: 9.6 mg/dL (ref 8.9–10.3)
Chloride: 103 mmol/L (ref 98–111)
Creatinine: 0.78 mg/dL (ref 0.61–1.24)
GFR, Estimated: >60 mL/min (ref >60)
Glucose, Bld: 92 mg/dL (ref 70–99)
Potassium: 4.1 mmol/L (ref 3.5–5.1)
Sodium: 138 mmol/L (ref 135–145)
Total Bilirubin: 1.1 mg/dL (ref 0.3–1.2)
Total Protein: 7.3 g/dL (ref 6.5–8.1)

## 2020-05-14 LAB — TSH: TSH: 33.809 u[IU]/mL — ABNORMAL HIGH (ref 0.320–4.118)

## 2020-05-14 MED ORDER — LEVOTHYROXINE SODIUM 75 MCG PO TABS: 75.0000 ug | ORAL_TABLET | Freq: Every day | ORAL | 2 refills | Status: DC

## 2020-05-14 MED ORDER — SODIUM CHLORIDE 0.9 % IV SOLN
1500.0000 mg | Freq: Once | INTRAVENOUS | Status: AC
Start: 2020-05-14 — End: 2020-05-14
  Administered 2020-05-14: 1500 mg via INTRAVENOUS
  Filled 2020-05-14: qty 30

## 2020-05-14 MED ORDER — SODIUM CHLORIDE 0.9 % IV SOLN
Freq: Once | INTRAVENOUS | Status: AC
  Filled 2020-05-14: qty 250

## 2020-05-14 NOTE — Patient Instructions (Signed)
[facility] Discharge Instructions for Patients Receiving Chemotherapy  Today you received the following immunotherapy agent: Durvalumab (Imfinzi)  To help prevent nausea and vomiting after your treatment, we encourage you to take your nausea medication as directed by your MD.   If you develop nausea and vomiting that is not controlled by your nausea medication, call the clinic.   BELOW ARE SYMPTOMS THAT SHOULD BE REPORTED IMMEDIATELY:  *FEVER GREATER THAN 100.5 F  *CHILLS WITH OR WITHOUT FEVER  NAUSEA AND VOMITING THAT IS NOT CONTROLLED WITH YOUR NAUSEA MEDICATION  *UNUSUAL SHORTNESS OF BREATH  *UNUSUAL BRUISING OR BLEEDING  TENDERNESS IN MOUTH AND THROAT WITH OR WITHOUT PRESENCE OF ULCERS  *URINARY PROBLEMS  *BOWEL PROBLEMS  UNUSUAL RASH Items with * indicate a potential emergency and should be followed up as soon as possible.  Feel free to call the clinic should you have any questions or concerns. The clinic phone number is [phone].  Please show the [facility] at check-in to the Emergency Department and triage nurse.

## 2020-05-15 ENCOUNTER — Telehealth: Payer: Self-pay | Admitting: Physician Assistant

## 2020-05-15 NOTE — Telephone Encounter (Signed)
Scheduled appointments per 2/23 los. Will have updated calendar printed for patient at next visit.

## 2020-05-23 ENCOUNTER — Other Ambulatory Visit: Payer: Self-pay

## 2020-05-26 ENCOUNTER — Other Ambulatory Visit: Payer: Self-pay

## 2020-05-26 ENCOUNTER — Ambulatory Visit (INDEPENDENT_AMBULATORY_CARE_PROVIDER_SITE_OTHER): Payer: Medicare Other | Admitting: Cardiology

## 2020-05-26 ENCOUNTER — Encounter: Payer: Self-pay | Admitting: Cardiology

## 2020-05-26 ENCOUNTER — Ambulatory Visit (INDEPENDENT_AMBULATORY_CARE_PROVIDER_SITE_OTHER): Payer: Medicare Other | Admitting: Internal Medicine

## 2020-05-26 ENCOUNTER — Encounter: Payer: Self-pay | Admitting: Internal Medicine

## 2020-05-26 VITALS — BP 110/68 | HR 69 | Ht 71.0 in | Wt 197.6 lb

## 2020-05-26 DIAGNOSIS — I2510 Atherosclerotic heart disease of native coronary artery without angina pectoris: Secondary | ICD-10-CM | POA: Diagnosis not present

## 2020-05-26 DIAGNOSIS — I10 Essential (primary) hypertension: Secondary | ICD-10-CM

## 2020-05-26 DIAGNOSIS — E039 Hypothyroidism, unspecified: Secondary | ICD-10-CM | POA: Diagnosis not present

## 2020-05-26 DIAGNOSIS — I4892 Unspecified atrial flutter: Secondary | ICD-10-CM

## 2020-05-26 DIAGNOSIS — C3492 Malignant neoplasm of unspecified part of left bronchus or lung: Secondary | ICD-10-CM

## 2020-05-26 DIAGNOSIS — F411 Generalized anxiety disorder: Secondary | ICD-10-CM

## 2020-05-26 DIAGNOSIS — I351 Nonrheumatic aortic (valve) insufficiency: Secondary | ICD-10-CM

## 2020-05-26 DIAGNOSIS — K8020 Calculus of gallbladder without cholecystitis without obstruction: Secondary | ICD-10-CM

## 2020-05-26 DIAGNOSIS — G4733 Obstructive sleep apnea (adult) (pediatric): Secondary | ICD-10-CM

## 2020-05-26 DIAGNOSIS — R001 Bradycardia, unspecified: Secondary | ICD-10-CM | POA: Diagnosis not present

## 2020-05-26 DIAGNOSIS — Z9989 Dependence on other enabling machines and devices: Secondary | ICD-10-CM | POA: Diagnosis not present

## 2020-05-26 DIAGNOSIS — I480 Paroxysmal atrial fibrillation: Secondary | ICD-10-CM | POA: Diagnosis not present

## 2020-05-26 DIAGNOSIS — F1010 Alcohol abuse, uncomplicated: Secondary | ICD-10-CM

## 2020-05-26 NOTE — Assessment & Plan Note (Signed)
On Imfinzi 1500 mg IV every 4 weeks.

## 2020-05-26 NOTE — Assessment & Plan Note (Signed)
Worse on Imfinzi 1500 mg IV every 4 weeks. On Levothroid TSH, FT4, FT3

## 2020-05-26 NOTE — Patient Instructions (Signed)
Medication Instructions:  Your physician recommends that you continue on your current medications as directed. Please refer to the Current Medication list given to you today.  *If you need a refill on your cardiac medications before your next appointment, please call your pharmacy*   Testing/Procedures: Your physician has requested that you have a lexiscan myoview. For further information please visit [email or web address]. Please follow instruction sheet, as given.  Your physician has requested that you have an echocardiogram in December 2022. Echocardiography is a painless test that uses sound waves to create images of your heart. It provides your doctor with information about the size and shape of your heart and how well your heart's chambers and valves are working. This procedure takes approximately one hour. There are no restrictions for this procedure.   Follow-Up: At [facility], you and your health needs are our priority.  As part of our continuing mission to provide you with exceptional heart care, we have created designated Provider Care Teams.  These Care Teams include your primary Cardiologist (physician) and Advanced Practice Providers (APPs -  Physician Assistants and Nurse Practitioners) who all work together to provide you with the care you need, when you need it.  We recommend signing up for the patient portal called "MyChart".  Sign up information is provided on this After Visit Summary.  MyChart is used to connect with patients for Virtual Visits (Telemedicine).  Patients are able to view lab/test results, encounter notes, upcoming appointments, etc.  Non-urgent messages can be sent to your provider as well.   To learn more about what you can do with MyChart, go to [email or web address].    Your next appointment:   1 year(s)  The format for your next appointment:   In Person  Provider:   Fransico Him, MD

## 2020-05-26 NOTE — Progress Notes (Signed)
Subjective:  Patient ID: Joe [name], male    DOB: 02-22-1946  Age: 75 y.o. MRN: [id number]  CC: No chief complaint on file.   HPI Joe [name] presents for abn TSH, wt gain, lung cancer f/u F/u HTN C/o hard stools  Outpatient Medications Prior to Visit  Medication Sig Dispense Refill  . amLODipine (NORVASC) 5 MG tablet TAKE 1 TABLET BY MOUTH EVERY DAY 90 tablet 2  . atenolol (TENORMIN) 25 MG tablet Take 1 tablet (25 mg total) by mouth daily as needed (for palpitations). 90 tablet 3  . Cholecalciferol 25 MCG (1000 UT) tablet Take 1,000 Units by mouth daily.    . diazepam (VALIUM) 5 MG tablet Take 1 tablet (5 mg total) by mouth daily.    [name] docusate sodium (COLACE) 100 MG capsule Take 100 mg by mouth 2 (two) times daily.    [name] ELIQUIS 5 MG TABS tablet TAKE 1 TABLET(5 MG) BY MOUTH TWICE DAILY 180 tablet 1  . levothyroxine (SYNTHROID) 75 MCG tablet Take 1 tablet (75 mcg total) by mouth daily. 90 tablet 2  . lovastatin (MEVACOR) 20 MG tablet TAKE 1 TABLET BY MOUTH EVERY NIGHT AT BEDTIME 90 tablet 2  . Multiple Vitamin (MULTIVITAMIN) tablet Take 1 tablet by mouth daily. Centrum Silver.    Vladimir Faster Glycol-Propyl Glycol (SYSTANE) 0.4-0.3 % SOLN Place 1 drop into both eyes daily as needed (Dry eyes). Ultra    . prochlorperazine (COMPAZINE) 10 MG tablet Take 1 tablet (10 mg total) by mouth every 6 (six) hours as needed. 30 tablet 2   No facility-administered medications prior to visit.    ROS: Review of Systems  Constitutional: Negative for appetite change, fatigue and unexpected weight change.  HENT: Negative for congestion, nosebleeds, sneezing, sore throat and trouble swallowing.   Eyes: Negative for itching and visual disturbance.  Respiratory: Negative for cough.   Cardiovascular: Negative for chest pain, palpitations and leg swelling.  Gastrointestinal: Negative for abdominal distention, blood in stool, diarrhea and nausea.  Genitourinary: Negative for frequency and  hematuria.  Musculoskeletal: Positive for arthralgias. Negative for back pain, gait problem, joint swelling and neck pain.  Skin: Negative for rash.  Neurological: Negative for dizziness, tremors, speech difficulty and weakness.  Psychiatric/Behavioral: Negative for agitation, dysphoric mood and sleep disturbance. The patient is not nervous/anxious.     Objective:  BP 122/68 (BP Location: Left Arm)   Pulse 60   Temp 98.4 F (36.9 C) (Oral)   Ht 5\' 11"  (1.803 m)   Wt 197 lb 9.6 oz (89.6 kg)   SpO2 96%   BMI 27.56 kg/m   BP Readings from Last 3 Encounters:  05/26/20 122/68  05/14/20 111/68  04/15/20 139/77    Wt Readings from Last 3 Encounters:  05/26/20 197 lb 9.6 oz (89.6 kg)  05/14/20 198 lb (89.8 kg)  04/15/20 192 lb (87.1 kg)    Physical Exam Constitutional:      General: He is not in acute distress.    Appearance: He is well-developed. He is obese.     Comments: NAD  HENT:     Mouth/Throat:     Mouth: Oropharynx is clear and moist.  Eyes:     Conjunctiva/sclera: Conjunctivae normal.     Pupils: Pupils are equal, round, and reactive to light.  Neck:     Thyroid: No thyromegaly.     Vascular: No JVD.  Cardiovascular:     Rate and Rhythm: Normal rate and regular rhythm.  Pulses: Intact distal pulses.     Heart sounds: Normal heart sounds. No murmur heard. No friction rub. No gallop.   Pulmonary:     Effort: Pulmonary effort is normal. No respiratory distress.     Breath sounds: Normal breath sounds. No wheezing or rales.  Chest:     Chest wall: No tenderness.  Abdominal:     General: Bowel sounds are normal. There is no distension.     Palpations: Abdomen is soft. There is no mass.     Tenderness: There is no abdominal tenderness. There is no guarding or rebound.  Musculoskeletal:        General: No tenderness or edema. Normal range of motion.     Cervical back: Normal range of motion.  Lymphadenopathy:     Cervical: No cervical adenopathy.  Skin:     General: Skin is warm and dry.     Findings: No rash.  Neurological:     Mental Status: He is alert and oriented to person, place, and time.     Cranial Nerves: No cranial nerve deficit.     Motor: No abnormal muscle tone.     Coordination: He displays a negative Romberg sign. Coordination normal.     Gait: Gait normal.     Deep Tendon Reflexes: Reflexes are normal and symmetric.  Psychiatric:        Mood and Affect: Mood and affect normal.        Behavior: Behavior normal.        Thought Content: Thought content normal.        Judgment: Judgment normal.     Lab Results  Component Value Date   WBC 10.0 05/14/2020   HGB 14.9 05/14/2020   HCT 45.6 05/14/2020   PLT 157 05/14/2020   GLUCOSE 92 05/14/2020   CHOL 153 11/23/2018   TRIG 201.0 (H) 11/23/2018   HDL 38.20 (L) 11/23/2018   LDLDIRECT 99.0 11/23/2018   LDLCALC 80 11/07/2017   ALT 25 05/14/2020   AST 23 05/14/2020   NA 138 05/14/2020   K 4.1 05/14/2020   CL 103 05/14/2020   CREATININE 0.78 05/14/2020   BUN 9 05/14/2020   CO2 26 05/14/2020   TSH 33.809 (H) 05/14/2020   PSA 0.23 11/23/2018   INR 1.4 (H) 10/15/2019    CT Chest W Contrast  Result Date: 05/13/2020 CLINICAL DATA:  Primary Cancer Type: Lung Imaging Indication: Assess response to therapy Interval therapy since last imaging? Yes Initial Cancer Diagnosis Date: 11/20/2019; Established by: Biopsy-proven Detailed Pathology: Stage IIIb non-small cell lung cancer, squamous cell carcinoma. Primary Tumor location: Left upper lobe. Surgeries: No. Chemotherapy: Yes; Ongoing? No; Most recent administration: 01/14/2020 Immunotherapy?  Yes; Type: Imfinzi; Ongoing? Yes Radiation therapy? Yes; Date Range: 12/10/2019 - 01/21/2020; Target: Left lung Other Cancers: Chronic lymphocytic leukemia, Stage 0. EXAM: CT CHEST WITH CONTRAST TECHNIQUE: Multidetector CT imaging of the chest was performed during intravenous contrast administration. CONTRAST:  31mL OMNIPAQUE IOHEXOL 300 MG/ML   SOLN COMPARISON:  Most recent CT chest 02/11/2020.  11/09/2019 PET-CT. FINDINGS: Cardiovascular: Aortic atherosclerosis. Normal heart size. Left coronary artery calcifications. No pericardial effusion. Mediastinum/Nodes: Unchanged prominent pretracheal and AP window lymph nodes, largest measuring 1.3 x 0.9 cm (series 2, image 51). Thyroid gland, trachea, and esophagus demonstrate no significant findings. Lungs/Pleura: Slight interval decrease in size of a spiculated mass of the suprahilar left upper lobe, dominant component measuring 3.3 x 2.3 cm, previously 3.5 x 2.5 cm (series 5, image 40). Stable 6 mm  pulmonary nodule of the anterior right upper lobe (series 5, image 70). Mild bibasilar predominant pulmonary fibrosis featuring irregular peripheral interstitial opacity and septal thickening, without clear evidence of bronchiolectasis. Moderate centrilobular and paraseptal emphysema. No pleural effusion or pneumothorax. Upper Abdomen: No acute abnormality. Hepatic steatosis. Gallstones in the gallbladder. Musculoskeletal: No chest wall mass or suspicious bone lesions identified. IMPRESSION: 1. Slight interval decrease in size of a spiculated mass of the suprahilar left upper lobe, consistent with treatment response. 2. Stable 6 mm pulmonary nodule of the anterior right upper lobe, nonspecific. Attention on follow-up. 3. Unchanged prominent pretracheal and AP window lymph nodes. 4. Mild bibasilar predominant pulmonary fibrosis featuring irregular peripheral interstitial opacity and septal thickening, without clear evidence of bronchiolectasis, unchanged. 5. Emphysema. 6. Coronary artery disease. 7. Hepatic steatosis. 8. Cholelithiasis. Aortic Atherosclerosis (ICD10-I70.0) and Emphysema (ICD10-J43.9). Electronically Signed   By: Eddie Candle M.D.   On: 05/13/2020 10:31    Assessment & Plan:    Walker Kehr, MD

## 2020-05-26 NOTE — Assessment & Plan Note (Addendum)
Cont w/Eliquis; Tenormin 50 mg po prn

## 2020-05-26 NOTE — Progress Notes (Signed)
Date:  05/26/2020   ID:  OLNEY MONIER, DOB 02-06-1946, MRN [id number]   PCP:  Cassandria Anger, MD  Cardiologist: Fransico Him, MD Electrophysiologist:  None   Chief Complaint:  HTN, PAF, OSA  History of Present Illness:    Joe [name] is a 75 y.o. male  with a hx of HTN.  He was seen by me in 2015 when he had an EKG that the PCP though was afib but after review there was no findings of afib on EKG.He saw Dr. Alain Marion in March and was placed on Atenolol 50mg  PRN for palpitations.    He underwent home sleep study which showed mild OSA with an AHI of 9.2/hr with nocturnal hypoxemia with O2 sat 75% and mild snoring.  He underwent CPAP titration to 9cm H2O.    Since I saw him last he was dx with Stage 3b non small cell CA/squamous cell CA in the LUL with mediastinal lymphadenopathy and contralateral right hilar hypermetabolism and uptake in L4 and pulmonary fibrosis on CT and noted to have CAD as well.  He underwent XRT and tumor continues to shrink as much as 75%.  He is currently on immunotherapy and chemo with Carboplatin and Paclitaxel.    He is here today for followup and is doing well.  He denies any chest pain or pressure, PND, orthopnea, LE edema, dizziness or syncope. He has chronic DOE multifactorial to COPD and lung CA.  He is followed by Pulmonary.  He has had a few episodes where he he has had some palpitations during treatment of his lung CA.  He also had some problems with bradycardia and was found to have hypothyroidism with TSH of 33 and his synthroid was adjusted.  His palpitations have resolved but he continues to have episodes of bradycardia as low as the 30's during the day but remains asymptomatic.  His average HR is in the 60-70's. He is compliant with his meds and is tolerating meds with no SE.    He is doing well with his CPAP device and thinks that he has gotten used to it.  He tolerates the mask and feels the pressure is adequate.  Since going on CPAP he  feels rested in the am and has no significant daytime sleepiness.  He does not get up at much at night to urinate like he did before he was on CPAP.  He takes a nap from time to time during the day while watching the News.  He denies any significant mouth or nasal dryness or nasal congestion.  He does not think that he snores.    Prior CV studies:   The following studies were reviewed today:   PAP compliance download from [facility]  2D echo 02/2020 IMPRESSIONS  1. Left ventricular ejection fraction, by estimation, is 60 to 65%. Left  ventricular ejection fraction by 3D volume is 59 %. The left ventricle has  normal function. The left ventricle has no regional wall motion  abnormalities. Left ventricular diastolic  parameters were normal. The average left ventricular global longitudinal  strain is -19.8 %. The global longitudinal strain is normal.  2. Right ventricular systolic function is normal. The right ventricular  size is normal. Tricuspid regurgitation signal is inadequate for assessing  PA pressure.  3. The mitral valve is grossly normal. No evidence of mitral valve  regurgitation. No evidence of mitral stenosis.  4. The aortic valve is tricuspid. There is mild calcification of the  aortic valve.  There is mild thickening of the aortic valve. Aortic valve  regurgitation is mild to moderate. No aortic stenosis is present. Aortic  regurgitation PHT measures 498 msec.  5. The inferior vena cava is normal in size with greater than 50%  respiratory variability, suggesting right atrial pressure of 3 mmHg.   Past Medical History:  Diagnosis Date  . Alcohol abuse, daily use 08/24/2010   Stopped 06-21-2013   . Arthralgia Jan 05, 2014   9/15 Not related to statins OA   . Cataract   . Chronic lymphocytic leukemia ([place]) 07-18-2014   chronic stage 1- no symtoms  . CLL (chronic lymphocytic leukemia) ([place]) 08/08/2014   06-22-14 Dr Burr Medico Stage 0  . COPD mixed type ([place]) 07/26/2007   Smoker - stopped  6/14   . De Quervain's tenosynovitis, right 01-05-14   06/21/2013   . Depression    at times  . Dysrhythmia    PAF  . Dysuria Jan 05, 2014   9/15 - poss stricture Urol ref was offered   . Gallstones 11/16/2017   Asymptomatic Pt refused surg ref  . Generalized anxiety disorder 09/07/2012   Chronic   Potential benefits of a long term steroid  use as well as potential risks  and complications were explained to the patient and were aknowledged.     [name] GERD 12/02/2006   Chronic     . Grief 2016-06-06   [name] died in 22-Jun-2014  . Gynecomastia January 05, 2014   Benign B 06/21/2013   . Hyperlipidemia   . Hypertension   . Hypothyroidism 12/25/2014   22-Jun-2014 On Levothyroxine   . Intertrigo 02/02/2012   11/13   . Neoplasm of uncertain behavior of skin 03/12/2013   12/14 R ear, chest   . OSA on CPAP    mild with AHI 9/hr and oxygen desats as low as 75%  . Paresis ([place])    right- s/p cerv decompression  . PERIORBITAL CELLULITIS 02/22/2009   Qualifier: Diagnosis of  By: Diona Browner MD, Amy    . 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 BICEPS TENDON REPAIR    . BRONCHIAL BIOPSY  10/23/2019   Procedure: BRONCHIAL BIOPSIES;  Surgeon: [name], MD;  Location: [facility] ENDOSCOPY;  Service: Pulmonary;;  . BRONCHIAL BIOPSY  11/20/2019   Procedure: BRONCHIAL BIOPSIES;  Surgeon: [name], MD;  Location: [facility] ENDOSCOPY;  Service: Pulmonary;;  . BRONCHIAL BRUSHINGS  10/23/2019   Procedure: BRONCHIAL BRUSHINGS;  Surgeon: [name], MD;  Location: [facility] ENDOSCOPY;  Service: Pulmonary;;  . BRONCHIAL BRUSHINGS  11/20/2019   Procedure: BRONCHIAL BRUSHINGS;  Surgeon: [name], MD;  Location: [facility] ENDOSCOPY;  Service: Pulmonary;;  . BRONCHIAL NEEDLE ASPIRATION BIOPSY  10/23/2019   Procedure: BRONCHIAL NEEDLE ASPIRATION BIOPSIES;  Surgeon: [name], MD;  Location: [facility] ENDOSCOPY;  Service: Pulmonary;;  . BRONCHIAL NEEDLE ASPIRATION BIOPSY  11/20/2019   Procedure: BRONCHIAL NEEDLE  ASPIRATION BIOPSIES;  Surgeon: [name], MD;  Location: [place];  Service: Pulmonary;;  . BRONCHIAL WASHINGS  11/20/2019   Procedure: BRONCHIAL WASHINGS;  Surgeon: [name], MD;  Location: [facility] ENDOSCOPY;  Service: Pulmonary;;  . CATARACT EXTRACTION Left   . COLONOSCOPY  04-14-99   Dr Flossie Dibble polyp-TA in epic  . POLYPECTOMY  04-14-99  . POSTERIOR LAMINECTOMY / DECOMPRESSION CERVICAL SPINE     Dr Saintclair Halsted  . RETINAL DETACHMENT SURGERY     left eye, 06/21/2005 x2, June 22, 2006 x 3  . ROTATOR  CUFF REPAIR  2004   right  . TONSILLECTOMY  [id number]  . VIDEO BRONCHOSCOPY WITH ENDOBRONCHIAL NAVIGATION N/A 10/23/2019   Procedure: VIDEO BRONCHOSCOPY WITH ENDOBRONCHIAL NAVIGATION;  Surgeon: [name], MD;  Location: [place] ENDOSCOPY;  Service: Pulmonary;  Laterality: N/A;  . VIDEO BRONCHOSCOPY WITH ENDOBRONCHIAL NAVIGATION N/A 11/20/2019   Procedure: VIDEO BRONCHOSCOPY WITH ENDOBRONCHIAL NAVIGATION;  Surgeon: [name], MD;  Location: [place] ENDOSCOPY;  Service: Pulmonary;  Laterality: N/A;  . VIDEO BRONCHOSCOPY WITH ENDOBRONCHIAL ULTRASOUND N/A 10/23/2019   Procedure: VIDEO BRONCHOSCOPY WITH ENDOBRONCHIAL ULTRASOUND;  Surgeon: [name], MD;  Location: [place] ENDOSCOPY;  Service: Pulmonary;  Laterality: N/A;     Current Meds  Medication Sig  . amLODipine (NORVASC) 5 MG tablet TAKE 1 TABLET BY MOUTH EVERY DAY  . atenolol (TENORMIN) 25 MG tablet Take 1 tablet (25 mg total) by mouth daily as needed (for palpitations).  . Cholecalciferol 25 MCG (1000 UT) tablet Take 1,000 Units by mouth daily.  . diazepam (VALIUM) 5 MG tablet Take 1 tablet (5 mg total) by mouth daily.  [name] docusate sodium (COLACE) 100 MG capsule Take 100 mg by mouth 2 (two) times daily.  [name] ELIQUIS 5 MG TABS tablet TAKE 1 TABLET(5 MG) BY MOUTH TWICE DAILY  . levothyroxine (SYNTHROID) 75 MCG tablet Take 1 tablet (75 mcg total) by mouth daily.  [name] lovastatin (MEVACOR) 20 MG tablet TAKE 1 TABLET BY MOUTH EVERY NIGHT AT BEDTIME  . Multiple  Vitamin (MULTIVITAMIN) tablet Take 1 tablet by mouth daily. Centrum Silver.  Vladimir Faster Glycol-Propyl Glycol (SYSTANE) 0.4-0.3 % SOLN Place 1 drop into both eyes daily as needed (Dry eyes). Ultra  . prochlorperazine (COMPAZINE) 10 MG tablet Take 1 tablet (10 mg total) by mouth every 6 (six) hours as needed.     Allergies:   Patient has no known allergies.   Social History   Tobacco Use  . Smoking status: Former Smoker    Packs/day: 0.80    Years: 50.00    Pack years: 40.00    Types: Cigarettes    Quit date: 08/27/2012    Years since quitting: 7.7  . Smokeless tobacco: Never Used  Vaping Use  . Vaping Use: Never used  Substance Use Topics  . Alcohol use: Not Currently  . Drug use: No     Family Hx: The patient's family history includes Breast cancer (age of onset: 85) in his paternal aunt; COPD in his mother; Coronary artery disease in an other family member; Diabetes in his father. There is no history of Colon cancer.  ROS:   Please see the history of present illness.     All other systems reviewed and are negative.   Labs/Other Tests and Data Reviewed:    Recent Labs: 05/14/2020: ALT 25; BUN 9; Creatinine 0.78; Hemoglobin 14.9; Platelet Count 157; Potassium 4.1; Sodium 138; TSH 33.809   Recent Lipid Panel Lab Results  Component Value Date/Time   CHOL 153 11/23/2018 10:35 AM   TRIG 201.0 (H) 11/23/2018 10:35 AM   HDL 38.20 (L) 11/23/2018 10:35 AM   CHOLHDL 4 11/23/2018 10:35 AM   LDLCALC 80 11/07/2017 08:59 AM   LDLDIRECT 99.0 11/23/2018 10:35 AM    Wt Readings from Last 3 Encounters:  05/26/20 197 lb 9.6 oz (89.6 kg)  05/26/20 197 lb 9.6 oz (89.6 kg)  05/14/20 198 lb (89.8 kg)     Objective:    Vital Signs:  BP 110/68   Pulse 69   Ht 5\' 11"  (1.803  m)   Wt 197 lb 9.6 oz (89.6 kg)   SpO2 94%   BMI 27.56 kg/m    GEN: Well nourished, well developed in no acute distress HEENT: Normal NECK: No JVD; No carotid bruits LYMPHATICS: No  lymphadenopathy CARDIAC:RRR, no murmurs, rubs, gallops RESPIRATORY:  Clear to auscultation without rales, wheezing or rhonchi  ABDOMEN: Soft, non-tender, non-distended MUSCULOSKELETAL:  No edema; No deformity  SKIN: Warm and dry NEUROLOGIC:  Alert and oriented x 3 PSYCHIATRIC:  Normal affect    ASSESSMENT & PLAN:    1. OSA -  The patient is tolerating PAP therapy well without any problems. The PAP download was reviewed today and showed an AHI of 0.2/hr on 9 cm H2O with 100% compliance in using more than 4 hours nightly.  The patient has been using and benefiting from PAP use and will continue to benefit from therapy.   2.  HTN -Bp is well controlled on exam today -continue Amlodipine 5mg  daily  3.  PAF -he some palpitations a while back during Ca treatment but that seemed to settle out and is has been maintaining NSR on exam today -denies any bleeding on DOAC -continue Eliquis 5mg  BID and Atenolol 25mg  daily PRN -SCr was stable at 0.78 and Hbg 14.9 in Feb 2022  4.  Aortic insufficiency -remains stable at mild to moderate by echo 02/2020 -repeat echo 02/2021  5.  Coronary artery calcifications -noted on recent chest CT -he is asymptomatic -I will get a Lexiscan myoview to assess for ischemia -Shared Decision Making/Informed Consent The risks [chest pain, shortness of breath, cardiac arrhythmias, dizziness, blood pressure fluctuations, myocardial infarction, stroke/transient ischemic attack, nausea, vomiting, allergic reaction, radiation exposure, metallic taste sensation and life-threatening complications (estimated to be 1 in 10,000)], benefits (risk stratification, diagnosing coronary artery disease, treatment guidance) and alternatives of a nuclear stress test were discussed in detail with Mr. Seoane and he agrees to proceed.  6.  Bradycardia -transient and noted during lung CA treatment and found to be hypothyroid -on Synthroid with resoluation  Medication Adjustments/Labs  and Tests Ordered: Current medicines are reviewed at length with the patient today.  Concerns regarding medicines are outlined above.  Tests Ordered: No orders of the defined types were placed in this encounter.  Medication Changes: No orders of the defined types were placed in this encounter.   Disposition:  Follow up in 1 year(s)  Signed, Fransico Him, MD  05/26/2020 3:04 PM    [facility]

## 2020-05-26 NOTE — Addendum Note (Signed)
Addended by: Antonieta Iba on: 05/26/2020 03:11 PM   Modules accepted: Orders

## 2020-05-26 NOTE — Assessment & Plan Note (Signed)
Diazepam prn  Potential benefits of a long term benzodiazepines  use as well as potential risks  and complications were explained to the patient and were aknowledged. 

## 2020-05-26 NOTE — Assessment & Plan Note (Signed)
No sx's at present

## 2020-05-26 NOTE — Assessment & Plan Note (Signed)
No alcohol now

## 2020-05-26 NOTE — Assessment & Plan Note (Signed)
Cont w/Amlodipine po

## 2020-05-28 ENCOUNTER — Other Ambulatory Visit: Payer: Self-pay | Admitting: Internal Medicine

## 2020-05-30 ENCOUNTER — Telehealth: Payer: Self-pay | Admitting: Internal Medicine

## 2020-05-30 NOTE — Telephone Encounter (Signed)
MD is out of the office will hold until he return on 06/09/20.Marland KitchenJohny [name]

## 2020-05-30 NOTE — Telephone Encounter (Signed)
Patient called and said that when he looked on his AVS from 05/26/20 that he saw alcohol abuse and said that he has not drank in 8-9 years. He was wondering if it could be taken off the AVS. Please advise

## 2020-06-02 NOTE — Addendum Note (Signed)
Addended by: Fransico Him R on: 06/02/2020 12:50 PM   Modules accepted: Orders

## 2020-06-05 NOTE — Telephone Encounter (Signed)
Noted Dx removed as "resolved" thx

## 2020-06-06 NOTE — Progress Notes (Signed)
[place] OFFICE PROGRESS NOTE  [name], [name], MD [place] [address]  DIAGNOSIS: 1)Stage IIIbnon-small cell lung cancer, squamous cell carcinoma. The patient presented with a left upper lobe lung mass as well as associated mediastinal lymphadenopathy and contralateral right hilar mild hypermetabolism. There was heterogeneous marrow uptake in the spine but with more focal areas at L4 without imaging correlation on CT scan. The patient was diagnosed in August 2021. 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 First dose expected on 12/13/2019.Status post 5 cycles.Last dose was giving January 14, 2020.  CURRENT THERAPY: Consolidation treatment with immunotherapy with Imfinzi 1500 mg IV every 4 weeks. First dose February 19, 2020. Status post 4cycles.  INTERVAL HISTORY: Joe [name] 75 y.o. male returns to the clinic for a follow up visit. The patient is feeling well today without any concerning complaints. In the interval, the patient followed up with his cardiologist and PCP for a follow up visit.   At his last appointment, the patient was endorsing weight gain. His TSH was elevated and his synthroid was adjusted. He noticed improvement in his palpitations since adjusting his thyroid medication.   Otherwise, he tolerated his last cycle of treatment well. Now that the weather is warming up, he is going to work on getting back into his walking routine. Denies any fever, chills, night sweats, or weight loss. Denies any chest pain, shortness of breath, cough, or hemoptysis. Denies any nausea, vomiting, diarrhea. He takes colace for constipation. He has been endorsing gas recently and bloating. In the next few months, he was going to arrange for a repeat colonoscopy due to being due. Denies any headache or  visual changes. Denies any rashes or skin changes.  The patient is here today for evaluation prior to starting cycle # 5  MEDICAL HISTORY: Past Medical History:  Diagnosis Date  . Alcohol abuse, daily use 08/24/2010   Stopped 05-16-13   . Arthralgia 12/21/13   9/15 Not related to statins OA   . Cataract   . 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 CLL (chronic lymphocytic leukemia) ([place]) 08/08/2014   May 16, 2014 Dr Burr Medico Stage 0  . COPD mixed type ([place]) 07/26/2007   Smoker - stopped 6/14   . De Quervain's tenosynovitis, right 12/21/13   05/16/2013   . Depression    at times  . Dysrhythmia    PAF  . Dysuria December 21, 2013   9/15 - poss stricture Urol ref was offered   . Gallstones 11/16/2017   Asymptomatic Pt refused surg ref  . Generalized anxiety disorder 09/07/2012   Chronic   Potential benefits of a long term steroid  use as well as potential risks  and complications were explained to the patient and were aknowledged.     [name] GERD 12/02/2006   Chronic     . Grief 2016-05-22   [name] died in 05-16-2014  . Gynecomastia 12/21/13   Benign B 05-16-13   . Hyperlipidemia   . Hypertension   . Hypothyroidism 12/25/2014   2014-05-16 On Levothyroxine   . Intertrigo 02/02/2012   11/13   . Neoplasm of uncertain behavior of skin 03/12/2013   12/14 R ear, chest   . OSA on CPAP    mild with AHI 9/hr and oxygen desats as low as 75%  .  Paresis ([place])    right- s/p cerv decompression  . PERIORBITAL CELLULITIS 02/22/2009   Qualifier: Diagnosis of  By: Diona Browner MD, Amy    . 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  MEDICATIONS:  Current Outpatient Medications  Medication Sig Dispense Refill  . amLODipine (NORVASC) 5 MG tablet TAKE 1 TABLET BY MOUTH EVERY DAY 90 tablet 2  . atenolol (TENORMIN) 25 MG tablet Take 1 tablet (25 mg total) by mouth daily as needed (for palpitations). 90 tablet 3  . Cholecalciferol 25 MCG (1000 UT) tablet Take 1,000 Units by mouth daily.     . diazepam (VALIUM) 5 MG tablet TAKE 1 TABLET(5 MG) BY MOUTH EVERY 12 HOURS 180 tablet 1  . docusate sodium (COLACE) 100 MG capsule Take 100 mg by mouth 2 (two) times daily.    [name] ELIQUIS 5 MG TABS tablet TAKE 1 TABLET(5 MG) BY MOUTH TWICE DAILY 180 tablet 1  . levothyroxine (SYNTHROID) 75 MCG tablet Take 1 tablet (75 mcg total) by mouth daily. 90 tablet 2  . lovastatin (MEVACOR) 20 MG tablet TAKE 1 TABLET BY MOUTH EVERY NIGHT AT BEDTIME 90 tablet 2  . Multiple Vitamin (MULTIVITAMIN) tablet Take 1 tablet by mouth daily. Centrum Silver.    Vladimir Faster Glycol-Propyl Glycol (SYSTANE) 0.4-0.3 % SOLN Place 1 drop into both eyes daily as needed (Dry eyes). Ultra    . prochlorperazine (COMPAZINE) 10 MG tablet Take 1 tablet (10 mg total) by mouth every 6 (six) hours as needed. 30 tablet 2   No current facility-administered medications for this visit.    SURGICAL HISTORY:  Past Surgical History:  Procedure Laterality Date  . BICEPS TENDON REPAIR    . BRONCHIAL BIOPSY  10/23/2019   Procedure: BRONCHIAL BIOPSIES;  Surgeon: [name], MD;  Location: [facility] ENDOSCOPY;  Service: Pulmonary;;  . BRONCHIAL BIOPSY  11/20/2019   Procedure: BRONCHIAL BIOPSIES;  Surgeon: [name], MD;  Location: [facility] ENDOSCOPY;  Service: Pulmonary;;  . BRONCHIAL BRUSHINGS  10/23/2019   Procedure: BRONCHIAL BRUSHINGS;  Surgeon: [name], MD;  Location: [facility] ENDOSCOPY;  Service: Pulmonary;;  . BRONCHIAL BRUSHINGS  11/20/2019   Procedure: BRONCHIAL BRUSHINGS;  Surgeon: [name], MD;  Location: [facility] ENDOSCOPY;  Service: Pulmonary;;  . BRONCHIAL NEEDLE ASPIRATION BIOPSY  10/23/2019   Procedure: BRONCHIAL NEEDLE ASPIRATION BIOPSIES;  Surgeon: [name], MD;  Location: [facility] ENDOSCOPY;  Service: Pulmonary;;  . BRONCHIAL NEEDLE ASPIRATION BIOPSY  11/20/2019   Procedure: BRONCHIAL NEEDLE ASPIRATION BIOPSIES;  Surgeon: [name], MD;  Location: [place];  Service: Pulmonary;;  . BRONCHIAL WASHINGS  11/20/2019    Procedure: BRONCHIAL WASHINGS;  Surgeon: [name], MD;  Location: [facility] ENDOSCOPY;  Service: Pulmonary;;  . CATARACT EXTRACTION Left   . COLONOSCOPY  04-14-99   Dr Flossie Dibble polyp-TA in epic  . POLYPECTOMY  04-14-99  . POSTERIOR LAMINECTOMY / DECOMPRESSION CERVICAL SPINE     Dr Saintclair Halsted  . RETINAL DETACHMENT SURGERY     left eye, 2007 x2, 2008 x 3  . ROTATOR CUFF REPAIR  2004   right  . TONSILLECTOMY  [id number]  . VIDEO BRONCHOSCOPY WITH ENDOBRONCHIAL NAVIGATION N/A 10/23/2019   Procedure: VIDEO BRONCHOSCOPY WITH ENDOBRONCHIAL NAVIGATION;  Surgeon: [name], MD;  Location: [place] ENDOSCOPY;  Service: Pulmonary;  Laterality: N/A;  . VIDEO BRONCHOSCOPY WITH ENDOBRONCHIAL NAVIGATION N/A 11/20/2019   Procedure: VIDEO BRONCHOSCOPY WITH ENDOBRONCHIAL NAVIGATION;  Surgeon: [name], MD;  Location:  [place] ENDOSCOPY;  Service: Pulmonary;  Laterality: N/A;  . VIDEO BRONCHOSCOPY WITH ENDOBRONCHIAL ULTRASOUND N/A 10/23/2019   Procedure: VIDEO BRONCHOSCOPY WITH ENDOBRONCHIAL ULTRASOUND;  Surgeon: [name], MD;  Location: [place] ENDOSCOPY;  Service: Pulmonary;  Laterality: N/A;    REVIEW OF SYSTEMS:   Review of Systems  Constitutional: Negative for appetite change, chills, fatigue, fever and unexpected weight change.  HENT: Negative for mouth sores, nosebleeds, sore throat and trouble swallowing.   Eyes: Negative for eye problems and icterus.  Respiratory: Negative for cough, hemoptysis, shortness of breath and wheezing.   Cardiovascular: Negative for chest pain and leg swelling.  Gastrointestinal: Positive for frequent constipation and occasional bloating. Negative for diarrhea, nausea and vomiting.  Genitourinary: Negative for bladder incontinence, difficulty urinating, dysuria, frequency and hematuria.   Musculoskeletal: Negative for back pain, gait problem, neck pain and neck stiffness.  Skin: Negative for itching and rash.  Neurological: Negative for dizziness, extremity weakness, gait  problem, headaches, light-headedness and seizures.  Hematological: Negative for adenopathy. Does not bruise/bleed easily.  Psychiatric/Behavioral: Negative for confusion, depression and sleep disturbance. The patient is not nervous/anxious.     PHYSICAL EXAMINATION:  Blood pressure 136/75, pulse 72, temperature 97.6 F (36.4 C), temperature source Tympanic, resp. rate 15, height _0  (1.803 m), weight 200 lb 8 oz (90.9 kg), SpO2 98 %.  ECOG PERFORMANCE STATUS: 1 - Symptomatic but completely ambulatory  Physical Exam  Constitutional: Oriented to person, place, and time and well-developed, well-nourished, and in no distress. No distress.  HENT:  Head: Normocephalic and atraumatic.  Mouth/Throat: Oropharynx is clear and moist. No oropharyngeal exudate.  Eyes: Conjunctivae are normal. Right eye exhibits no discharge. Left eye exhibits no discharge. No scleral icterus.  Neck: Normal range of motion. Neck supple.  Cardiovascular: Normal rate, regular rhythm, normal heart sounds and intact distal pulses.   Pulmonary/Chest: Effort normal. Quiet breath sounds in all lung fields. No respiratory distress. No wheezes. No rales.  Abdominal: Soft. Bowel sounds are normal. Exhibits no distension and no mass. There is no tenderness.  Musculoskeletal: Normal range of motion. Exhibits no edema.  Lymphadenopathy:    No cervical adenopathy.  Neurological: Alert and oriented to person, place, and time. Exhibits normal muscle tone. Gait normal. Coordination normal. Ambulates with a cane.  Skin: Skin is warm and dry. No rash noted. Not diaphoretic. No erythema. No pallor.  Psychiatric: Mood, memory and judgment normal.  Vitals reviewed.  LABORATORY DATA: Lab Results  Component Value Date   WBC 10.5 06/10/2020   HGB 14.8 06/10/2020   HCT 45.5 06/10/2020   MCV 91.7 06/10/2020   PLT 157 06/10/2020      Chemistry      Component Value Date/Time   NA 141 06/10/2020 1051   NA 143 03/10/2017 0911   K  4.2 06/10/2020 1051   K 4.7 03/10/2017 0911   CL 103 06/10/2020 1051   CO2 26 06/10/2020 1051   CO2 30 (H) 03/10/2017 0911   BUN 8 06/10/2020 1051   BUN 10.7 03/10/2017 0911   CREATININE 0.84 06/10/2020 1051   CREATININE 0.8 03/10/2017 0911      Component Value Date/Time   CALCIUM 9.8 06/10/2020 1051   CALCIUM 9.4 03/10/2017 0911   ALKPHOS 74 06/10/2020 1051   ALKPHOS 76 03/10/2017 0911   AST 20 06/10/2020 1051   AST 34 03/10/2017 0911   ALT 22 06/10/2020 1051   ALT 45 03/10/2017 0911   BILITOT 0.8 06/10/2020 1051   BILITOT 1.41 (H)  03/10/2017 0911       RADIOGRAPHIC STUDIES:  CT Chest W Contrast  Result Date: 05/13/2020 CLINICAL DATA:  Primary Cancer Type: Lung Imaging Indication: Assess response to therapy Interval therapy since last imaging? Yes Initial Cancer Diagnosis Date: 11/20/2019; Established by: Biopsy-proven Detailed Pathology: Stage IIIb non-small cell lung cancer, squamous cell carcinoma. Primary Tumor location: Left upper lobe. Surgeries: No. Chemotherapy: Yes; Ongoing? No; Most recent administration: 01/14/2020 Immunotherapy?  Yes; Type: Imfinzi; Ongoing? Yes Radiation therapy? Yes; Date Range: 12/10/2019 - 01/21/2020; Target: Left lung Other Cancers: Chronic lymphocytic leukemia, Stage 0. EXAM: CT CHEST WITH CONTRAST TECHNIQUE: Multidetector CT imaging of the chest was performed during intravenous contrast administration. CONTRAST:  69m OMNIPAQUE IOHEXOL 300 MG/ML  SOLN COMPARISON:  Most recent CT chest 02/11/2020.  11/09/2019 PET-CT. FINDINGS: Cardiovascular: Aortic atherosclerosis. Normal heart size. Left coronary artery calcifications. No pericardial effusion. Mediastinum/Nodes: Unchanged prominent pretracheal and AP window lymph nodes, largest measuring 1.3 x 0.9 cm (series 2, image 51). Thyroid gland, trachea, and esophagus demonstrate no significant findings. Lungs/Pleura: Slight interval decrease in size of a spiculated mass of the suprahilar left upper lobe,  dominant component measuring 3.3 x 2.3 cm, previously 3.5 x 2.5 cm (series 5, image 40). Stable 6 mm pulmonary nodule of the anterior right upper lobe (series 5, image 70). Mild bibasilar predominant pulmonary fibrosis featuring irregular peripheral interstitial opacity and septal thickening, without clear evidence of bronchiolectasis. Moderate centrilobular and paraseptal emphysema. No pleural effusion or pneumothorax. Upper Abdomen: No acute abnormality. Hepatic steatosis. Gallstones in the gallbladder. Musculoskeletal: No chest wall mass or suspicious bone lesions identified. IMPRESSION: 1. Slight interval decrease in size of a spiculated mass of the suprahilar left upper lobe, consistent with treatment response. 2. Stable 6 mm pulmonary nodule of the anterior right upper lobe, nonspecific. Attention on follow-up. 3. Unchanged prominent pretracheal and AP window lymph nodes. 4. Mild bibasilar predominant pulmonary fibrosis featuring irregular peripheral interstitial opacity and septal thickening, without clear evidence of bronchiolectasis, unchanged. 5. Emphysema. 6. Coronary artery disease. 7. Hepatic steatosis. 8. Cholelithiasis. Aortic Atherosclerosis (ICD10-I70.0) and Emphysema (ICD10-J43.9). Electronically Signed   By: AEddie CandleM.D.   On: 05/13/2020 10:31     ASSESSMENT/PLAN:  This is a very pleasant 75year old Caucasian male referred to the clinic for evaluation of stage IIIb non-small cell lung cancer, squamous cell carcinoma. The patient presented with a left upper lobe lung mass as well as associated mediastinal lymphadenopathy and contralateral right hilar mild hypermetabolism. There was heterogeneous marrow uptake in general in the spine but with more focal areas on the L4 level without imaging correlation on CT.His PDL1 expression is 2%.He was diagnosed in August 2021. The patient also has a  stage 0 CLL.   He underwent a course of concurrent chemoradiation with weekly carboplatin  for AUC of 2 and paclitaxel 45 mg/M2 status post 5 cycles of the treatment. He tolerated this treatment well with no concerning adverse effect except for mild odynophagia and dysphagia as well as thrombocytopenia.  The patient is currently undergoing consolidation treatment with immunotherapy with Imfinzi 1500 mg IV every 4 weeks status post4cycles. He tolerates this well.   Labs were reviewed. Recommend that he proceed with cycle #5 today as scheduled.   We will see him back for a follow up visit in 4 weeks for evaluation before starting cycle #5.   We will continue to monitor his TSH closely. He had a dose adjustment at his last appointment. We will check this at every visit and  will monitor today to make sure it is down trending. Discussed we will consider the next dose adjustment at his next appointment if needed.   The patient was advised to call immediately if he has any concerning symptoms in the interval. The patient voices understanding of current disease status and treatment options and is in agreement with the current care plan. All questions were answered. The patient knows to call the clinic with any problems, questions or concerns. We can certainly see the patient much sooner if necessary         No orders of the defined types were placed in this encounter.    I spent 20-29 minutes in this encounter.   Bela Bonaparte L Anala Whisenant, PA-C 06/10/20

## 2020-06-10 ENCOUNTER — Other Ambulatory Visit: Payer: Self-pay | Admitting: Physician Assistant

## 2020-06-10 ENCOUNTER — Inpatient Hospital Stay: Payer: Medicare Other

## 2020-06-10 ENCOUNTER — Encounter (INDEPENDENT_AMBULATORY_CARE_PROVIDER_SITE_OTHER): Payer: Medicare Other | Admitting: Ophthalmology

## 2020-06-10 ENCOUNTER — Inpatient Hospital Stay (HOSPITAL_BASED_OUTPATIENT_CLINIC_OR_DEPARTMENT_OTHER): Payer: Medicare Other | Admitting: Physician Assistant

## 2020-06-10 ENCOUNTER — Other Ambulatory Visit: Payer: Self-pay

## 2020-06-10 ENCOUNTER — Inpatient Hospital Stay: Payer: Medicare Other | Attending: Hematology

## 2020-06-10 VITALS — BP 136/75 | HR 72 | Temp 97.6°F | Resp 15 | Ht 71.0 in | Wt 200.5 lb

## 2020-06-10 DIAGNOSIS — C3492 Malignant neoplasm of unspecified part of left bronchus or lung: Secondary | ICD-10-CM

## 2020-06-10 DIAGNOSIS — Z87891 Personal history of nicotine dependence: Secondary | ICD-10-CM | POA: Insufficient documentation

## 2020-06-10 DIAGNOSIS — E785 Hyperlipidemia, unspecified: Secondary | ICD-10-CM | POA: Insufficient documentation

## 2020-06-10 DIAGNOSIS — Z79899 Other long term (current) drug therapy: Secondary | ICD-10-CM | POA: Diagnosis not present

## 2020-06-10 DIAGNOSIS — I480 Paroxysmal atrial fibrillation: Secondary | ICD-10-CM | POA: Insufficient documentation

## 2020-06-10 DIAGNOSIS — R590 Localized enlarged lymph nodes: Secondary | ICD-10-CM | POA: Diagnosis not present

## 2020-06-10 DIAGNOSIS — C9110 Chronic lymphocytic leukemia of B-cell type not having achieved remission: Secondary | ICD-10-CM | POA: Diagnosis not present

## 2020-06-10 DIAGNOSIS — Z923 Personal history of irradiation: Secondary | ICD-10-CM | POA: Diagnosis not present

## 2020-06-10 DIAGNOSIS — Z9221 Personal history of antineoplastic chemotherapy: Secondary | ICD-10-CM | POA: Insufficient documentation

## 2020-06-10 DIAGNOSIS — E039 Hypothyroidism, unspecified: Secondary | ICD-10-CM | POA: Insufficient documentation

## 2020-06-10 DIAGNOSIS — G4733 Obstructive sleep apnea (adult) (pediatric): Secondary | ICD-10-CM | POA: Diagnosis not present

## 2020-06-10 DIAGNOSIS — Z9989 Dependence on other enabling machines and devices: Secondary | ICD-10-CM | POA: Insufficient documentation

## 2020-06-10 DIAGNOSIS — I10 Essential (primary) hypertension: Secondary | ICD-10-CM | POA: Insufficient documentation

## 2020-06-10 DIAGNOSIS — R1310 Dysphagia, unspecified: Secondary | ICD-10-CM | POA: Diagnosis not present

## 2020-06-10 DIAGNOSIS — Z5112 Encounter for antineoplastic immunotherapy: Secondary | ICD-10-CM

## 2020-06-10 DIAGNOSIS — D696 Thrombocytopenia, unspecified: Secondary | ICD-10-CM | POA: Diagnosis not present

## 2020-06-10 DIAGNOSIS — K219 Gastro-esophageal reflux disease without esophagitis: Secondary | ICD-10-CM | POA: Insufficient documentation

## 2020-06-10 DIAGNOSIS — J432 Centrilobular emphysema: Secondary | ICD-10-CM | POA: Insufficient documentation

## 2020-06-10 DIAGNOSIS — C3412 Malignant neoplasm of upper lobe, left bronchus or lung: Secondary | ICD-10-CM | POA: Diagnosis not present

## 2020-06-10 LAB — CMP (CANCER CENTER ONLY)
ALT: 22 U/L (ref 0–44)
AST: 20 U/L (ref 15–41)
Albumin: 4 g/dL (ref 3.5–5.0)
Alkaline Phosphatase: 74 U/L (ref 38–126)
Anion gap: 12 (ref 5–15)
BUN: 8 mg/dL (ref 8–23)
CO2: 26 mmol/L (ref 22–32)
Calcium: 9.8 mg/dL (ref 8.9–10.3)
Chloride: 103 mmol/L (ref 98–111)
Creatinine: 0.84 mg/dL (ref 0.61–1.24)
GFR, Estimated: >60 mL/min (ref >60)
Glucose, Bld: 95 mg/dL (ref 70–99)
Potassium: 4.2 mmol/L (ref 3.5–5.1)
Sodium: 141 mmol/L (ref 135–145)
Total Bilirubin: 0.8 mg/dL (ref 0.3–1.2)
Total Protein: 7.4 g/dL (ref 6.5–8.1)

## 2020-06-10 LAB — CBC WITH DIFFERENTIAL (CANCER CENTER ONLY)
Abs Immature Granulocytes: 0.04 10*3/uL (ref 0.00–0.07)
Basophils Absolute: 0 10*3/uL (ref 0.0–0.1)
Basophils Relative: 0 %
Eosinophils Absolute: 0.3 10*3/uL (ref 0.0–0.5)
Eosinophils Relative: 3 %
HCT: 45.5 % (ref 39.0–52.0)
Hemoglobin: 14.8 g/dL (ref 13.0–17.0)
Immature Granulocytes: 0 %
Lymphocytes Relative: 20 %
Lymphs Abs: 2.1 10*3/uL (ref 0.7–4.0)
MCH: 29.8 pg (ref 26.0–34.0)
MCHC: 32.5 g/dL (ref 30.0–36.0)
MCV: 91.7 fL (ref 80.0–100.0)
Monocytes Absolute: 1 10*3/uL (ref 0.1–1.0)
Monocytes Relative: 10 %
Neutro Abs: 7 10*3/uL (ref 1.7–7.7)
Neutrophils Relative %: 67 %
Platelet Count: 157 10*3/uL (ref 150–400)
RBC: 4.96 MIL/uL (ref 4.22–5.81)
RDW: 13.2 % (ref 11.5–15.5)
WBC Count: 10.5 10*3/uL (ref 4.0–10.5)
nRBC: 0 % (ref 0.0–0.2)

## 2020-06-10 LAB — TSH: TSH: 36.437 u[IU]/mL — ABNORMAL HIGH (ref 0.320–4.118)

## 2020-06-10 MED ORDER — SODIUM CHLORIDE 0.9 % IV SOLN
Freq: Once | INTRAVENOUS | Status: AC
  Filled 2020-06-10: qty 250

## 2020-06-10 MED ORDER — LEVOTHYROXINE SODIUM 100 MCG PO TABS: 100.0000 ug | ORAL_TABLET | Freq: Every day | ORAL | 2 refills | Status: DC

## 2020-06-10 MED ORDER — SODIUM CHLORIDE 0.9 % IV SOLN
1500.0000 mg | Freq: Once | INTRAVENOUS | Status: AC
  Administered 2020-06-10: 1500 mg via INTRAVENOUS
  Filled 2020-06-10: qty 30

## 2020-06-10 NOTE — Patient Instructions (Signed)
[facility] Discharge Instructions for Patients Receiving Chemotherapy  Today you received the following chemotherapy agents: durvalumab.  To help prevent nausea and vomiting after your treatment, we encourage you to take your nausea medication as directed.   If you develop nausea and vomiting that is not controlled by your nausea medication, call the clinic.   BELOW ARE SYMPTOMS THAT SHOULD BE REPORTED IMMEDIATELY:  *FEVER GREATER THAN 100.5 F  *CHILLS WITH OR WITHOUT FEVER  NAUSEA AND VOMITING THAT IS NOT CONTROLLED WITH YOUR NAUSEA MEDICATION  *UNUSUAL SHORTNESS OF BREATH  *UNUSUAL BRUISING OR BLEEDING  TENDERNESS IN MOUTH AND THROAT WITH OR WITHOUT PRESENCE OF ULCERS  *URINARY PROBLEMS  *BOWEL PROBLEMS  UNUSUAL RASH Items with * indicate a potential emergency and should be followed up as soon as possible.  Feel free to call the clinic should you have any questions or concerns. The clinic phone number is [phone].  Please show the CHEMO ALERT CARD at check-in to the Emergency Department and triage nurse.   

## 2020-06-12 ENCOUNTER — Encounter (INDEPENDENT_AMBULATORY_CARE_PROVIDER_SITE_OTHER): Payer: Medicare Other | Admitting: Ophthalmology

## 2020-06-12 ENCOUNTER — Other Ambulatory Visit: Payer: Self-pay

## 2020-06-12 DIAGNOSIS — H35033 Hypertensive retinopathy, bilateral: Secondary | ICD-10-CM

## 2020-06-12 DIAGNOSIS — H34831 Tributary (branch) retinal vein occlusion, right eye, with macular edema: Secondary | ICD-10-CM | POA: Diagnosis not present

## 2020-06-12 DIAGNOSIS — H353122 Nonexudative age-related macular degeneration, left eye, intermediate dry stage: Secondary | ICD-10-CM | POA: Diagnosis not present

## 2020-06-12 DIAGNOSIS — H338 Other retinal detachments: Secondary | ICD-10-CM

## 2020-06-12 DIAGNOSIS — I10 Essential (primary) hypertension: Secondary | ICD-10-CM | POA: Diagnosis not present

## 2020-06-12 DIAGNOSIS — H43811 Vitreous degeneration, right eye: Secondary | ICD-10-CM | POA: Diagnosis not present

## 2020-06-14 ENCOUNTER — Other Ambulatory Visit: Payer: Self-pay | Admitting: Cardiology

## 2020-06-16 NOTE — Telephone Encounter (Signed)
Pt last saw Dr Radford Pax 05/26/20, last labs 06/10/20 Creat 0.84, age 75, weight 90.9kg, based on specified criteria pt is on appropriate dosage of Eliquis 5mg  BID for afib.  Will refill rx.

## 2020-07-08 ENCOUNTER — Other Ambulatory Visit: Payer: Self-pay

## 2020-07-08 ENCOUNTER — Inpatient Hospital Stay (HOSPITAL_BASED_OUTPATIENT_CLINIC_OR_DEPARTMENT_OTHER): Payer: Medicare Other | Admitting: Internal Medicine

## 2020-07-08 ENCOUNTER — Inpatient Hospital Stay: Payer: Medicare Other | Attending: Hematology

## 2020-07-08 ENCOUNTER — Inpatient Hospital Stay: Payer: Medicare Other

## 2020-07-08 VITALS — BP 135/63 | HR 63 | Temp 97.7°F | Resp 17 | Ht 71.0 in | Wt 200.9 lb

## 2020-07-08 DIAGNOSIS — I2510 Atherosclerotic heart disease of native coronary artery without angina pectoris: Secondary | ICD-10-CM | POA: Diagnosis not present

## 2020-07-08 DIAGNOSIS — Z7901 Long term (current) use of anticoagulants: Secondary | ICD-10-CM | POA: Diagnosis not present

## 2020-07-08 DIAGNOSIS — C3490 Malignant neoplasm of unspecified part of unspecified bronchus or lung: Secondary | ICD-10-CM | POA: Diagnosis not present

## 2020-07-08 DIAGNOSIS — C3492 Malignant neoplasm of unspecified part of left bronchus or lung: Secondary | ICD-10-CM | POA: Diagnosis not present

## 2020-07-08 DIAGNOSIS — Z79899 Other long term (current) drug therapy: Secondary | ICD-10-CM | POA: Diagnosis not present

## 2020-07-08 DIAGNOSIS — E039 Hypothyroidism, unspecified: Secondary | ICD-10-CM

## 2020-07-08 DIAGNOSIS — C9111 Chronic lymphocytic leukemia of B-cell type in remission: Secondary | ICD-10-CM | POA: Insufficient documentation

## 2020-07-08 DIAGNOSIS — C3412 Malignant neoplasm of upper lobe, left bronchus or lung: Secondary | ICD-10-CM | POA: Insufficient documentation

## 2020-07-08 DIAGNOSIS — Z5112 Encounter for antineoplastic immunotherapy: Secondary | ICD-10-CM

## 2020-07-08 DIAGNOSIS — Z923 Personal history of irradiation: Secondary | ICD-10-CM | POA: Insufficient documentation

## 2020-07-08 DIAGNOSIS — Z9221 Personal history of antineoplastic chemotherapy: Secondary | ICD-10-CM | POA: Diagnosis not present

## 2020-07-08 DIAGNOSIS — I480 Paroxysmal atrial fibrillation: Secondary | ICD-10-CM | POA: Diagnosis not present

## 2020-07-08 LAB — CMP (CANCER CENTER ONLY)
ALT: 23 U/L (ref 0–44)
AST: 21 U/L (ref 15–41)
Albumin: 3.9 g/dL (ref 3.5–5.0)
Alkaline Phosphatase: 73 U/L (ref 38–126)
Anion gap: 10 (ref 5–15)
BUN: 9 mg/dL (ref 8–23)
CO2: 27 mmol/L (ref 22–32)
Calcium: 9.6 mg/dL (ref 8.9–10.3)
Chloride: 104 mmol/L (ref 98–111)
Creatinine: 0.83 mg/dL (ref 0.61–1.24)
GFR, Estimated: >60 mL/min (ref >60)
Glucose, Bld: 97 mg/dL (ref 70–99)
Potassium: 4.2 mmol/L (ref 3.5–5.1)
Sodium: 141 mmol/L (ref 135–145)
Total Bilirubin: 1.2 mg/dL (ref 0.3–1.2)
Total Protein: 7.3 g/dL (ref 6.5–8.1)

## 2020-07-08 LAB — CBC WITH DIFFERENTIAL (CANCER CENTER ONLY)
Abs Immature Granulocytes: 0.06 10*3/uL (ref 0.00–0.07)
Basophils Absolute: 0.1 10*3/uL (ref 0.0–0.1)
Basophils Relative: 1 %
Eosinophils Absolute: 0.2 10*3/uL (ref 0.0–0.5)
Eosinophils Relative: 2 %
HCT: 45.6 % (ref 39.0–52.0)
Hemoglobin: 14.8 g/dL (ref 13.0–17.0)
Immature Granulocytes: 1 %
Lymphocytes Relative: 22 %
Lymphs Abs: 2.3 10*3/uL (ref 0.7–4.0)
MCH: 29.8 pg (ref 26.0–34.0)
MCHC: 32.5 g/dL (ref 30.0–36.0)
MCV: 91.8 fL (ref 80.0–100.0)
Monocytes Absolute: 1 10*3/uL (ref 0.1–1.0)
Monocytes Relative: 10 %
Neutro Abs: 7.1 10*3/uL (ref 1.7–7.7)
Neutrophils Relative %: 64 %
Platelet Count: 166 10*3/uL (ref 150–400)
RBC: 4.97 MIL/uL (ref 4.22–5.81)
RDW: 13.3 % (ref 11.5–15.5)
WBC Count: 10.8 10*3/uL — ABNORMAL HIGH (ref 4.0–10.5)
nRBC: 0 % (ref 0.0–0.2)

## 2020-07-08 LAB — TSH: TSH: 15.637 u[IU]/mL — ABNORMAL HIGH (ref 0.320–4.118)

## 2020-07-08 MED ORDER — SODIUM CHLORIDE 0.9 % IV SOLN
1500.0000 mg | Freq: Once | INTRAVENOUS | Status: AC
  Administered 2020-07-08: 1500 mg via INTRAVENOUS
  Filled 2020-07-08: qty 30

## 2020-07-08 MED ORDER — SODIUM CHLORIDE 0.9 % IV SOLN
Freq: Once | INTRAVENOUS | Status: AC
Start: 2020-07-08 — End: 2020-07-08
  Filled 2020-07-08: qty 250

## 2020-07-08 NOTE — Progress Notes (Signed)
[facility] [phone]   [phone]  OFFICE PROGRESS NOTE  [name], [name], MD [place] [address]  DIAGNOSIS: 1)Stage IIIbnon-small cell lung cancer, squamous cell carcinoma. The patient presented with a left upper lobe lung mass as well as associated mediastinal lymphadenopathy and contralateral right hilar mild hypermetabolism. There was heterogeneous marrow uptake in the spine but with more focal areas at L4 without imaging correlation on CT scan. The patient was diagnosed in August 2021. 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 First dose expected on 12/13/2019. Status post 5 cycles.   Last dose was giving January 14, 2020.  CURRENT THERAPY:  Consolidation treatment with immunotherapy with Imfinzi 1500 mg IV every 4 weeks.  First dose February 19, 2020.  Status post 5 cycles.  INTERVAL HISTORY: Joe [name] 75 y.o. male returns to the clinic today for follow-up visit.  The patient is feeling fine today with no concerning complaints.  He has been tolerating his treatment with immunotherapy fairly well.  He denied having any current chest pain, shortness of breath, cough or hemoptysis.  He denied having any fever or chills.  He has no nausea, vomiting, diarrhea or constipation.  He has no headache or visual changes.  He is here today for evaluation before starting cycle #6 of his treatment.  MEDICAL HISTORY: Past Medical History:  Diagnosis Date  . Alcohol abuse, daily use 08/24/2010   Stopped 05/14/13   . Arthralgia Dec 27, 2013   9/15 Not related to statins OA   . Cataract   . Chronic lymphocytic leukemia ([place]) 07-18-2014   chronic stage 1- no symtoms  . CLL (chronic lymphocytic leukemia) ([place]) 08/08/2014   14-May-2014 Dr Burr Medico Stage 0  . COPD mixed type ([place]) 07/26/2007   Smoker -  stopped 6/14   . De Quervain's tenosynovitis, right 12-27-2013   05-14-2013   . Depression    at times  . Dysrhythmia    PAF  . Dysuria 12-27-13   9/15 - poss stricture Urol ref was offered   . Gallstones 11/16/2017   Asymptomatic Pt refused surg ref  . Generalized anxiety disorder 09/07/2012   Chronic   Potential benefits of a long term steroid  use as well as potential risks  and complications were explained to the patient and were aknowledged.     [name] GERD 12/02/2006   Chronic     . Grief 2016/05/28   [name] died in 05-14-2014  . Gynecomastia 12/27/2013   Benign B May 14, 2013   . Hyperlipidemia   . Hypertension   . Hypothyroidism 12/25/2014   05/14/14 On Levothyroxine   . Intertrigo 02/02/2012   11/13   . Neoplasm of uncertain behavior of skin 03/12/2013   12/14 R ear, chest   . OSA on CPAP    mild with AHI 9/hr and oxygen desats as low as 75%  . Paresis ([place])    right- s/p cerv decompression  . PERIORBITAL CELLULITIS 02/22/2009   Qualifier: Diagnosis of  By: Diona Browner MD, Amy    . 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  MEDICATIONS:  Current Outpatient Medications  Medication Sig Dispense Refill  . amLODipine (NORVASC) 5 MG tablet TAKE 1 TABLET BY MOUTH EVERY DAY 90 tablet 2  . atenolol (TENORMIN) 25  MG tablet Take 1 tablet (25 mg total) by mouth daily as needed (for palpitations). 90 tablet 3  . Cholecalciferol 25 MCG (1000 UT) tablet Take 1,000 Units by mouth daily.    . diazepam (VALIUM) 5 MG tablet TAKE 1 TABLET(5 MG) BY MOUTH EVERY 12 HOURS 180 tablet 1  . docusate sodium (COLACE) 100 MG capsule Take 100 mg by mouth 2 (two) times daily.    [name] ELIQUIS 5 MG TABS tablet TAKE 1 TABLET(5 MG) BY MOUTH TWICE DAILY 180 tablet 1  . levothyroxine (SYNTHROID) 100 MCG tablet Take 1 tablet (100 mcg total) by mouth daily. 30 tablet 2  . lovastatin (MEVACOR) 20 MG tablet TAKE 1 TABLET BY MOUTH EVERY NIGHT AT BEDTIME 90 tablet 2  . Multiple Vitamin (MULTIVITAMIN) tablet Take 1  tablet by mouth daily. Centrum Silver.    Vladimir Faster Glycol-Propyl Glycol (SYSTANE) 0.4-0.3 % SOLN Place 1 drop into both eyes daily as needed (Dry eyes). Ultra    . prochlorperazine (COMPAZINE) 10 MG tablet Take 1 tablet (10 mg total) by mouth every 6 (six) hours as needed. 30 tablet 2   No current facility-administered medications for this visit.    SURGICAL HISTORY:  Past Surgical History:  Procedure Laterality Date  . BICEPS TENDON REPAIR    . BRONCHIAL BIOPSY  10/23/2019   Procedure: BRONCHIAL BIOPSIES;  Surgeon: [name], MD;  Location: [facility] ENDOSCOPY;  Service: Pulmonary;;  . BRONCHIAL BIOPSY  11/20/2019   Procedure: BRONCHIAL BIOPSIES;  Surgeon: [name], MD;  Location: [facility] ENDOSCOPY;  Service: Pulmonary;;  . BRONCHIAL BRUSHINGS  10/23/2019   Procedure: BRONCHIAL BRUSHINGS;  Surgeon: [name], MD;  Location: [facility] ENDOSCOPY;  Service: Pulmonary;;  . BRONCHIAL BRUSHINGS  11/20/2019   Procedure: BRONCHIAL BRUSHINGS;  Surgeon: [name], MD;  Location: [facility] ENDOSCOPY;  Service: Pulmonary;;  . BRONCHIAL NEEDLE ASPIRATION BIOPSY  10/23/2019   Procedure: BRONCHIAL NEEDLE ASPIRATION BIOPSIES;  Surgeon: [name], MD;  Location: [facility] ENDOSCOPY;  Service: Pulmonary;;  . BRONCHIAL NEEDLE ASPIRATION BIOPSY  11/20/2019   Procedure: BRONCHIAL NEEDLE ASPIRATION BIOPSIES;  Surgeon: [name], MD;  Location: [facility];  Service: Pulmonary;;  . BRONCHIAL WASHINGS  11/20/2019   Procedure: BRONCHIAL WASHINGS;  Surgeon: [name], MD;  Location: [facility] ENDOSCOPY;  Service: Pulmonary;;  . CATARACT EXTRACTION Left   . COLONOSCOPY  04-14-99   Dr Flossie Dibble polyp-TA in epic  . POLYPECTOMY  04-14-99  . POSTERIOR LAMINECTOMY / DECOMPRESSION CERVICAL SPINE     Dr Saintclair Halsted  . RETINAL DETACHMENT SURGERY     left eye, 2007 x2, 2008 x 3  . ROTATOR CUFF REPAIR  2004   right  . TONSILLECTOMY  [id number]  . VIDEO BRONCHOSCOPY WITH ENDOBRONCHIAL NAVIGATION N/A 10/23/2019   Procedure: VIDEO  BRONCHOSCOPY WITH ENDOBRONCHIAL NAVIGATION;  Surgeon: [name], MD;  Location: [facility] ENDOSCOPY;  Service: Pulmonary;  Laterality: N/A;  . VIDEO BRONCHOSCOPY WITH ENDOBRONCHIAL NAVIGATION N/A 11/20/2019   Procedure: VIDEO BRONCHOSCOPY WITH ENDOBRONCHIAL NAVIGATION;  Surgeon: [name], MD;  Location: [facility] ENDOSCOPY;  Service: Pulmonary;  Laterality: N/A;  . VIDEO BRONCHOSCOPY WITH ENDOBRONCHIAL ULTRASOUND N/A 10/23/2019   Procedure: VIDEO BRONCHOSCOPY WITH ENDOBRONCHIAL ULTRASOUND;  Surgeon: [name], MD;  Location: [facility] ENDOSCOPY;  Service: Pulmonary;  Laterality: N/A;    REVIEW OF SYSTEMS:  A comprehensive review of systems was negative.   PHYSICAL EXAMINATION: General appearance: alert, cooperative and no distress Head: Normocephalic, without obvious abnormality, atraumatic Neck: no adenopathy, no  JVD, supple, symmetrical, trachea midline and thyroid not enlarged, symmetric, no tenderness/mass/nodules Lymph nodes: Cervical, supraclavicular, and axillary nodes normal. Resp: clear to auscultation bilaterally Back: symmetric, no curvature. ROM normal. No CVA tenderness. Cardio: regular rate and rhythm, S1, S2 normal, no murmur, click, rub or gallop GI: soft, non-tender; bowel sounds normal; no masses,  no organomegaly Extremities: extremities normal, atraumatic, no cyanosis or edema  ECOG PERFORMANCE STATUS: 1 - Symptomatic but completely ambulatory  Blood pressure 135/63, pulse 63, temperature 97.7 F (36.5 C), temperature source Tympanic, resp. rate 17, height _0  (1.803 m), weight 200 lb 14.4 oz (91.1 kg), SpO2 95 %.  LABORATORY DATA: Lab Results  Component Value Date   WBC 10.8 (H) 07/08/2020   HGB 14.8 07/08/2020   HCT 45.6 07/08/2020   MCV 91.8 07/08/2020   PLT 166 07/08/2020      Chemistry      Component Value Date/Time   NA 141 07/08/2020 1025   NA 143 03/10/2017 0911   K 4.2 07/08/2020 1025   K 4.7 03/10/2017 0911   CL 104 07/08/2020 1025   CO2 27  07/08/2020 1025   CO2 30 (H) 03/10/2017 0911   BUN 9 07/08/2020 1025   BUN 10.7 03/10/2017 0911   CREATININE 0.83 07/08/2020 1025   CREATININE 0.8 03/10/2017 0911      Component Value Date/Time   CALCIUM 9.6 07/08/2020 1025   CALCIUM 9.4 03/10/2017 0911   ALKPHOS 73 07/08/2020 1025   ALKPHOS 76 03/10/2017 0911   AST 21 07/08/2020 1025   AST 34 03/10/2017 0911   ALT 23 07/08/2020 1025   ALT 45 03/10/2017 0911   BILITOT 1.2 07/08/2020 1025   BILITOT 1.41 (H) 03/10/2017 0911       RADIOGRAPHIC STUDIES: No results found.  ASSESSMENT AND PLAN: This is a very pleasant 75 years old white male with a stage IIIb non-small cell lung cancer, squamous cell carcinoma diagnosed in August of 2021.  The patient also has a history of stage 0 CLL.  He underwent a course of concurrent chemoradiation with weekly carboplatin for AUC of 2 and paclitaxel 45 mg/M2 status post 5 cycles of the treatment.  He tolerated this treatment well with no concerning adverse effect except for mild odynophagia and dysphagia as well as thrombocytopenia. His scan showed improvement of his disease with decrease in the size of the left upper lobe lung mass as well as decrease in the mediastinal lymphadenopathy and no evidence of metastatic disease. The patient is currently undergoing consolidation treatment with immunotherapy with Imfinzi 1500 mg IV every 4 weeks status post 5 cycles. The patient continues to tolerate this treatment well with no concerning adverse effects. I recommended for him to proceed with cycle #6 today as planned. I will see him back for follow-up visit in 4 weeks for evaluation with repeat CT scan of the chest for restaging of his disease. The patient was advised to call immediately if he has any concerning symptoms in the interval. The patient voices understanding of current disease status and treatment options and is in agreement with the current care plan.  All questions were answered. The patient  knows to call the clinic with any problems, questions or concerns. We can certainly see the patient much sooner if necessary.  Disclaimer: This note was dictated with voice recognition software. Similar sounding words can inadvertently be transcribed and may not be corrected upon review.

## 2020-07-08 NOTE — Patient Instructions (Signed)
[facility] Discharge Instructions for Patients Receiving Chemotherapy  Today you received the following chemotherapy agents: Imfinzi.  To help prevent nausea and vomiting after your treatment, we encourage you to take your nausea medication as directed.   If you develop nausea and vomiting that is not controlled by your nausea medication, call the clinic.   BELOW ARE SYMPTOMS THAT SHOULD BE REPORTED IMMEDIATELY:  *FEVER GREATER THAN 100.5 F  *CHILLS WITH OR WITHOUT FEVER  NAUSEA AND VOMITING THAT IS NOT CONTROLLED WITH YOUR NAUSEA MEDICATION  *UNUSUAL SHORTNESS OF BREATH  *UNUSUAL BRUISING OR BLEEDING  TENDERNESS IN MOUTH AND THROAT WITH OR WITHOUT PRESENCE OF ULCERS  *URINARY PROBLEMS  *BOWEL PROBLEMS  UNUSUAL RASH Items with * indicate a potential emergency and should be followed up as soon as possible.  Feel free to call the clinic should you have any questions or concerns. The clinic phone number is [phone].  Please show the CHEMO ALERT CARD at check-in to the Emergency Department and triage nurse.   

## 2020-07-10 ENCOUNTER — Telehealth: Payer: Self-pay

## 2020-07-10 NOTE — Telephone Encounter (Signed)
Spoke with the patient, detailed instructions given. He stated that he would be here for his test. Asked to call back with any questions. S.Agustus Mane EMTP 

## 2020-07-17 ENCOUNTER — Other Ambulatory Visit: Payer: Self-pay

## 2020-07-17 ENCOUNTER — Ambulatory Visit (HOSPITAL_COMMUNITY): Payer: Medicare Other | Attending: Internal Medicine

## 2020-07-17 DIAGNOSIS — I2510 Atherosclerotic heart disease of native coronary artery without angina pectoris: Secondary | ICD-10-CM | POA: Diagnosis not present

## 2020-07-17 LAB — MYOCARDIAL PERFUSION IMAGING
LV dias vol: 77 mL (ref 62–150)
LV sys vol: 28 mL
Peak HR: 81 {beats}/min
Rest HR: 58 {beats}/min
SDS: 0
SRS: 0
SSS: 0
TID: 0.9

## 2020-07-17 MED ORDER — REGADENOSON 0.4 MG/5ML IV SOLN
0.4000 mg | Freq: Once | INTRAVENOUS | Status: AC
  Administered 2020-07-17: 0.4 mg via INTRAVENOUS

## 2020-07-17 MED ORDER — TECHNETIUM TC 99M TETROFOSMIN IV KIT
9.8000 | PACK | Freq: Once | INTRAVENOUS | Status: AC | PRN
Start: 2020-07-17 — End: 2020-07-17
  Administered 2020-07-17: 9.8 via INTRAVENOUS
  Filled 2020-07-17: qty 10

## 2020-07-17 MED ORDER — TECHNETIUM TC 99M TETROFOSMIN IV KIT
30.8000 | PACK | Freq: Once | INTRAVENOUS | Status: AC | PRN
  Administered 2020-07-17: 30.8 via INTRAVENOUS
  Filled 2020-07-17: qty 31

## 2020-07-28 ENCOUNTER — Telehealth: Payer: Self-pay | Admitting: Medical Oncology

## 2020-07-28 NOTE — Telephone Encounter (Signed)
Does not have CT scan scheduled. Schedule message sent.

## 2020-07-30 ENCOUNTER — Other Ambulatory Visit: Payer: Self-pay

## 2020-07-30 ENCOUNTER — Ambulatory Visit (HOSPITAL_COMMUNITY)
Admission: RE | Admit: 2020-07-30 | Discharge: 2020-07-30 | Disposition: A | Discharge status: Home / Self Care | Payer: Medicare Other | Source: Ambulatory Visit | Attending: Internal Medicine | Admitting: Internal Medicine

## 2020-07-30 DIAGNOSIS — C3490 Malignant neoplasm of unspecified part of unspecified bronchus or lung: Secondary | ICD-10-CM | POA: Insufficient documentation

## 2020-07-30 DIAGNOSIS — J8410 Pulmonary fibrosis, unspecified: Secondary | ICD-10-CM | POA: Diagnosis not present

## 2020-07-30 DIAGNOSIS — I2510 Atherosclerotic heart disease of native coronary artery without angina pectoris: Secondary | ICD-10-CM | POA: Diagnosis not present

## 2020-07-30 DIAGNOSIS — I712 Thoracic aortic aneurysm, without rupture: Secondary | ICD-10-CM | POA: Diagnosis not present

## 2020-07-30 MED ORDER — SODIUM CHLORIDE (PF) 0.9 % IJ SOLN
INTRAMUSCULAR | Status: AC
  Filled 2020-07-30: qty 50

## 2020-07-30 MED ORDER — IOHEXOL 300 MG/ML  SOLN
75.0000 mL | Freq: Once | INTRAMUSCULAR | Status: AC | PRN
  Administered 2020-07-30: 75 mL via INTRAVENOUS

## 2020-08-04 ENCOUNTER — Inpatient Hospital Stay: Payer: Medicare Other

## 2020-08-04 ENCOUNTER — Encounter: Payer: Self-pay | Admitting: Internal Medicine

## 2020-08-04 ENCOUNTER — Other Ambulatory Visit: Payer: Self-pay

## 2020-08-04 ENCOUNTER — Inpatient Hospital Stay (HOSPITAL_BASED_OUTPATIENT_CLINIC_OR_DEPARTMENT_OTHER): Payer: Medicare Other | Admitting: Internal Medicine

## 2020-08-04 ENCOUNTER — Inpatient Hospital Stay: Payer: Medicare Other | Attending: Hematology

## 2020-08-04 VITALS — BP 140/74 | HR 75 | Temp 97.1°F | Resp 20 | Ht 71.0 in | Wt 200.7 lb

## 2020-08-04 DIAGNOSIS — Z7901 Long term (current) use of anticoagulants: Secondary | ICD-10-CM | POA: Insufficient documentation

## 2020-08-04 DIAGNOSIS — Z87891 Personal history of nicotine dependence: Secondary | ICD-10-CM | POA: Diagnosis not present

## 2020-08-04 DIAGNOSIS — N62 Hypertrophy of breast: Secondary | ICD-10-CM | POA: Diagnosis not present

## 2020-08-04 DIAGNOSIS — Z9221 Personal history of antineoplastic chemotherapy: Secondary | ICD-10-CM | POA: Diagnosis not present

## 2020-08-04 DIAGNOSIS — Z5112 Encounter for antineoplastic immunotherapy: Secondary | ICD-10-CM | POA: Diagnosis not present

## 2020-08-04 DIAGNOSIS — C9110 Chronic lymphocytic leukemia of B-cell type not having achieved remission: Secondary | ICD-10-CM | POA: Insufficient documentation

## 2020-08-04 DIAGNOSIS — E039 Hypothyroidism, unspecified: Secondary | ICD-10-CM

## 2020-08-04 DIAGNOSIS — I10 Essential (primary) hypertension: Secondary | ICD-10-CM

## 2020-08-04 DIAGNOSIS — R590 Localized enlarged lymph nodes: Secondary | ICD-10-CM | POA: Diagnosis not present

## 2020-08-04 DIAGNOSIS — C3412 Malignant neoplasm of upper lobe, left bronchus or lung: Secondary | ICD-10-CM | POA: Insufficient documentation

## 2020-08-04 DIAGNOSIS — J449 Chronic obstructive pulmonary disease, unspecified: Secondary | ICD-10-CM | POA: Insufficient documentation

## 2020-08-04 DIAGNOSIS — I2510 Atherosclerotic heart disease of native coronary artery without angina pectoris: Secondary | ICD-10-CM

## 2020-08-04 DIAGNOSIS — C3492 Malignant neoplasm of unspecified part of left bronchus or lung: Secondary | ICD-10-CM

## 2020-08-04 DIAGNOSIS — Z79899 Other long term (current) drug therapy: Secondary | ICD-10-CM | POA: Diagnosis not present

## 2020-08-04 DIAGNOSIS — Z923 Personal history of irradiation: Secondary | ICD-10-CM | POA: Insufficient documentation

## 2020-08-04 LAB — CMP (CANCER CENTER ONLY)
ALT: 21 U/L (ref 0–44)
AST: 22 U/L (ref 15–41)
Albumin: 3.8 g/dL (ref 3.5–5.0)
Alkaline Phosphatase: 70 U/L (ref 38–126)
Anion gap: 7 (ref 5–15)
BUN: 8 mg/dL (ref 8–23)
CO2: 28 mmol/L (ref 22–32)
Calcium: 9.7 mg/dL (ref 8.9–10.3)
Chloride: 104 mmol/L (ref 98–111)
Creatinine: 0.76 mg/dL (ref 0.61–1.24)
GFR, Estimated: >60 mL/min (ref >60)
Glucose, Bld: 95 mg/dL (ref 70–99)
Potassium: 4.1 mmol/L (ref 3.5–5.1)
Sodium: 139 mmol/L (ref 135–145)
Total Bilirubin: 1.1 mg/dL (ref 0.3–1.2)
Total Protein: 7.2 g/dL (ref 6.5–8.1)

## 2020-08-04 LAB — CBC WITH DIFFERENTIAL (CANCER CENTER ONLY)
Abs Immature Granulocytes: 0.04 10*3/uL (ref 0.00–0.07)
Basophils Absolute: 0.1 10*3/uL (ref 0.0–0.1)
Basophils Relative: 1 %
Eosinophils Absolute: 0.2 10*3/uL (ref 0.0–0.5)
Eosinophils Relative: 2 %
HCT: 44.1 % (ref 39.0–52.0)
Hemoglobin: 14.7 g/dL (ref 13.0–17.0)
Immature Granulocytes: 0 %
Lymphocytes Relative: 22 %
Lymphs Abs: 2.2 10*3/uL (ref 0.7–4.0)
MCH: 30.8 pg (ref 26.0–34.0)
MCHC: 33.3 g/dL (ref 30.0–36.0)
MCV: 92.3 fL (ref 80.0–100.0)
Monocytes Absolute: 1 10*3/uL (ref 0.1–1.0)
Monocytes Relative: 10 %
Neutro Abs: 6.7 10*3/uL (ref 1.7–7.7)
Neutrophils Relative %: 65 %
Platelet Count: 177 10*3/uL (ref 150–400)
RBC: 4.78 MIL/uL (ref 4.22–5.81)
RDW: 12.6 % (ref 11.5–15.5)
WBC Count: 10.2 10*3/uL (ref 4.0–10.5)
nRBC: 0 % (ref 0.0–0.2)

## 2020-08-04 LAB — TSH: TSH: 8.894 u[IU]/mL — ABNORMAL HIGH (ref 0.320–4.118)

## 2020-08-04 MED ORDER — SODIUM CHLORIDE 0.9 % IV SOLN
1500.0000 mg | Freq: Once | INTRAVENOUS | Status: AC
  Administered 2020-08-04: 1500 mg via INTRAVENOUS
  Filled 2020-08-04: qty 30

## 2020-08-04 MED ORDER — SODIUM CHLORIDE 0.9 % IV SOLN
Freq: Once | INTRAVENOUS | Status: AC
  Filled 2020-08-04: qty 250

## 2020-08-04 NOTE — Patient Instructions (Signed)
[facility] MEDICAL ONCOLOGY  Discharge Instructions: Thank you for choosing [facility] to provide your oncology and hematology care.   If you have a lab appointment with the Cancer Center, please go directly to the Cancer Center and check in at the registration area.   Wear comfortable clothing and clothing appropriate for easy access to any Portacath or PICC line.   We strive to give you quality time with your provider. You may need to reschedule your appointment if you arrive late (15 or more minutes).  Arriving late affects you and other patients whose appointments are after yours.  Also, if you miss three or more appointments without notifying the office, you may be dismissed from the clinic at the provider's discretion.      For prescription refill requests, have your pharmacy contact our office and allow 72 hours for refills to be completed.    Today you received the following chemotherapy and/or immunotherapy agents imfinzi       To help prevent nausea and vomiting after your treatment, we encourage you to take your nausea medication as directed.  BELOW ARE SYMPTOMS THAT SHOULD BE REPORTED IMMEDIATELY: *FEVER GREATER THAN 100.4 F (38 C) OR HIGHER *CHILLS OR SWEATING *NAUSEA AND VOMITING THAT IS NOT CONTROLLED WITH YOUR NAUSEA MEDICATION *UNUSUAL SHORTNESS OF BREATH *UNUSUAL BRUISING OR BLEEDING *URINARY PROBLEMS (pain or burning when urinating, or frequent urination) *BOWEL PROBLEMS (unusual diarrhea, constipation, pain near the anus) TENDERNESS IN MOUTH AND THROAT WITH OR WITHOUT PRESENCE OF ULCERS (sore throat, sores in mouth, or a toothache) UNUSUAL RASH, SWELLING OR PAIN  UNUSUAL VAGINAL DISCHARGE OR ITCHING   Items with * indicate a potential emergency and should be followed up as soon as possible or go to the Emergency Department if any problems should occur.  Please show the CHEMOTHERAPY ALERT CARD or IMMUNOTHERAPY ALERT CARD at check-in to  the Emergency Department and triage nurse.  Should you have questions after your visit or need to cancel or reschedule your appointment, please contact [facility] MEDICAL [facility]COL[facility]  Dept: [phone]  and follow the prompts.  Office hours are 8:00 a.m. to 4:30 p.m. Monday - Friday. Please note that voicemails left after 4:00 p.m. may not be returned until the following business day.  We are closed weekends and major holidays. You have access to a nurse at all times for urgent questions. Please call the main number to the clinic Dept: [phone] and follow the prompts.   For any non-urgent questions, you may also contact your provider using MyChart. We now offer e-Visits for anyone 18 and older to request care online for non-urgent symptoms. For details visit [email or web address].   Also download the MyChart app! Go to the app store, search "MyChart", open the app, select [facility], and log in with your MyChart username and password.  Due to Covid, a mask is required upon entering the hospital/clinic. If you do not have a mask, one will be given to you upon arrival. For doctor visits, patients may have 1 support person aged 18 or older with them. For treatment visits, patients cannot have anyone with them due to current Covid guidelines and our immunocompromised population.   

## 2020-08-04 NOTE — Progress Notes (Signed)
    [facility] [phone]   [phone]  OFFICE PROGRESS NOTE  [name], Joe V, MD [address] [place] [address]  DIAGNOSIS: 1)Stage IIIbnon-small cell lung cancer, squamous cell carcinoma. The patient presented with a left upper lobe lung mass as well as associated mediastinal lymphadenopathy and contralateral right hilar mild hypermetabolism. There was heterogeneous marrow uptake in the spine but with more focal areas at L4 without imaging correlation on CT scan. The patient was diagnosed in August 2021. 2) CLL stage 0, diagnosed in 2016  PD-L1 expression: 2%  PRIOR THERAPY: Concurrent chemoradiation with carboplatin for an AUC of 2 and paclitaxel 45 mg per metered squared weekly. First dose expected on 12/13/2019. Status post 5 cycles.   Last dose was giving January 14, 2020.  CURRENT THERAPY:  Consolidation treatment with immunotherapy with Imfinzi 1500 mg IV every 4 weeks.  First dose February 19, 2020.  Status post 6 cycles.  INTERVAL HISTORY: Joe [name] 74 y.o. male returns to the clinic today for follow-up visit.  The patient is feeling fine today with no concerning complaints.  He denied having any current chest pain but has occasional shortness of breath with exertion especially from the seasonal allergy and pollens.  He denied having any chest pain, cough or hemoptysis.  He denied having any fever or chills.  He has no nausea, vomiting, diarrhea or constipation.  He has no headache or visual changes.  He continues to tolerate his consolidation treatment with Imfinzi fairly well.  The patient had repeat CT scan of the chest performed recently and he is here for evaluation and discussion of his scan results.  MEDICAL HISTORY: Past Medical History:  Diagnosis Date  . Alcohol abuse, daily use 08/24/2010   Stopped 2015   . Arthralgia 12/13/2013   9/15 Not related to statins OA   . Cataract   . Chronic lymphocytic  leukemia (HCC) 07-18-2014   chronic stage 1- no symtoms  . CLL (chronic lymphocytic leukemia) (HCC) 08/08/2014   2016 Dr Feng Stage 0  . COPD mixed type (HCC) 07/26/2007   Smoker - stopped 6/14   . De Quervain's tenosynovitis, right 12/13/2013   2015   . Depression    at times  . Dysrhythmia    PAF  . Dysuria 12/13/2013   9/15 - poss stricture Urol ref was offered   . Gallstones 11/16/2017   Asymptomatic Pt refused surg ref  . Generalized anxiety disorder 09/07/2012   Chronic   Potential benefits of a long term steroid  use as well as potential risks  and complications were explained to the patient and were aknowledged.     . GERD 12/02/2006   Chronic     . Grief 05/14/2016   [name] died in 2016  . Gynecomastia 12/13/2013   Benign B 2015   . Hyperlipidemia   . Hypertension   . Hypothyroidism 12/25/2014   2016 On Levothyroxine   . Intertrigo 02/02/2012   11/13   . Neoplasm of uncertain behavior of skin 03/12/2013   12/14 R ear, chest   . OSA on CPAP    mild with AHI 9/hr and oxygen desats as low as 75%  . Paresis (HCC)    right- s/p cerv decompression  . PERIORBITAL CELLULITIS 02/22/2009   Qualifier: Diagnosis of  By: Bedsole MD, Amy    . 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  MEDICATIONS:    Current Outpatient Medications  Medication Sig Dispense Refill  . amLODipine (NORVASC) 5 MG tablet TAKE 1 TABLET BY MOUTH EVERY DAY 90 tablet 2  . atenolol (TENORMIN) 25 MG tablet Take 1 tablet (25 mg total) by mouth daily as needed (for palpitations). 90 tablet 3  . Cholecalciferol 25 MCG (1000 UT) tablet Take 1,000 Units by mouth daily.    . diazepam (VALIUM) 5 MG tablet TAKE 1 TABLET(5 MG) BY MOUTH EVERY 12 HOURS 180 tablet 1  . docusate sodium (COLACE) 100 MG capsule Take 100 mg by mouth 2 (two) times daily.    . ELIQUIS 5 MG TABS tablet TAKE 1 TABLET(5 MG) BY MOUTH TWICE DAILY 180 tablet 1  . levothyroxine (SYNTHROID) 100 MCG tablet Take 1 tablet (100 mcg  total) by mouth daily. 30 tablet 2  . lovastatin (MEVACOR) 20 MG tablet TAKE 1 TABLET BY MOUTH EVERY NIGHT AT BEDTIME 90 tablet 2  . Multiple Vitamin (MULTIVITAMIN) tablet Take 1 tablet by mouth daily. Centrum Silver.    . Polyethyl Glycol-Propyl Glycol (SYSTANE) 0.4-0.3 % SOLN Place 1 drop into both eyes daily as needed (Dry eyes). Ultra    . prochlorperazine (COMPAZINE) 10 MG tablet Take 1 tablet (10 mg total) by mouth every 6 (six) hours as needed. 30 tablet 2   No current facility-administered medications for this visit.    SURGICAL HISTORY:  Past Surgical History:  Procedure Laterality Date  . BICEPS TENDON REPAIR    . BRONCHIAL BIOPSY  10/23/2019   Procedure: BRONCHIAL BIOPSIES;  Surgeon: Byrum, Robert S, MD;  Location: [facility];  Service: Pulmonary;;  . BRONCHIAL BIOPSY  11/20/2019   Procedure: BRONCHIAL BIOPSIES;  Surgeon: Byrum, Robert S, MD;  Location: [facility];  Service: Pulmonary;;  . BRONCHIAL BRUSHINGS  10/23/2019   Procedure: BRONCHIAL BRUSHINGS;  Surgeon: Byrum, Robert S, MD;  Location: [facility];  Service: Pulmonary;;  . BRONCHIAL BRUSHINGS  11/20/2019   Procedure: BRONCHIAL BRUSHINGS;  Surgeon: Byrum, Robert S, MD;  Location: [facility];  Service: Pulmonary;;  . BRONCHIAL NEEDLE ASPIRATION BIOPSY  10/23/2019   Procedure: BRONCHIAL NEEDLE ASPIRATION BIOPSIES;  Surgeon: Byrum, Robert S, MD;  Location: [facility];  Service: Pulmonary;;  . BRONCHIAL NEEDLE ASPIRATION BIOPSY  11/20/2019   Procedure: BRONCHIAL NEEDLE ASPIRATION BIOPSIES;  Surgeon: Byrum, Robert S, MD;  Location: [facility];  Service: Pulmonary;;  . BRONCHIAL WASHINGS  11/20/2019   Procedure: BRONCHIAL WASHINGS;  Surgeon: Byrum, Robert S, MD;  Location: [facility];  Service: Pulmonary;;  . CATARACT EXTRACTION Left   . COLONOSCOPY  04-14-99   Dr [place]-1 polyp-TA in epic  . POLYPECTOMY  04-14-99  . POSTERIOR LAMINECTOMY / DECOMPRESSION CERVICAL SPINE     Dr Cram  . RETINAL DETACHMENT SURGERY      left eye, 2007 x2, 2008 x 3  . ROTATOR CUFF REPAIR  2004   right  . TONSILLECTOMY  1951,1963  . VIDEO BRONCHOSCOPY WITH ENDOBRONCHIAL NAVIGATION N/A 10/23/2019   Procedure: VIDEO BRONCHOSCOPY WITH ENDOBRONCHIAL NAVIGATION;  Surgeon: Byrum, Robert S, MD;  Location: [facility];  Service: Pulmonary;  Laterality: N/A;  . VIDEO BRONCHOSCOPY WITH ENDOBRONCHIAL NAVIGATION N/A 11/20/2019   Procedure: VIDEO BRONCHOSCOPY WITH ENDOBRONCHIAL NAVIGATION;  Surgeon: Byrum, Robert S, MD;  Location: [facility];  Service: Pulmonary;  Laterality: N/A;  . VIDEO BRONCHOSCOPY WITH ENDOBRONCHIAL ULTRASOUND N/A 10/23/2019   Procedure: VIDEO BRONCHOSCOPY WITH ENDOBRONCHIAL ULTRASOUND;  Surgeon: Byrum, Robert S, MD;  Location: [facility];  Service: Pulmonary;  Laterality: N/A;    REVIEW OF   SYSTEMS:  Constitutional: negative Eyes: negative Ears, nose, mouth, throat, and face: negative Respiratory: positive for dyspnea on exertion Cardiovascular: negative Gastrointestinal: negative Genitourinary:negative Integument/breast: negative Hematologic/lymphatic: negative Musculoskeletal:negative Neurological: negative Behavioral/Psych: negative Endocrine: negative Allergic/Immunologic: negative   PHYSICAL EXAMINATION: General appearance: alert, cooperative and no distress Head: Normocephalic, without obvious abnormality, atraumatic Neck: no adenopathy, no JVD, supple, symmetrical, trachea midline and thyroid not enlarged, symmetric, no tenderness/mass/nodules Lymph nodes: Cervical, supraclavicular, and axillary nodes normal. Resp: clear to auscultation bilaterally Back: symmetric, no curvature. ROM normal. No CVA tenderness. Cardio: regular rate and rhythm, S1, S2 normal, no murmur, click, rub or gallop GI: soft, non-tender; bowel sounds normal; no masses,  no organomegaly Extremities: extremities normal, atraumatic, no cyanosis or edema Neurologic: Alert and oriented X 3, normal strength and tone. Normal symmetric  reflexes. Normal coordination and gait  ECOG PERFORMANCE STATUS: 1 - Symptomatic but completely ambulatory  Blood pressure 140/74, pulse 75, temperature (!) 97.1 F (36.2 C), temperature source Tympanic, resp. rate 20, height 5' 11" (1.803 m), weight 200 lb 11.2 oz (91 kg), SpO2 95 %.  LABORATORY DATA: Lab Results  Component Value Date   WBC 10.2 08/04/2020   HGB 14.7 08/04/2020   HCT 44.1 08/04/2020   MCV 92.3 08/04/2020   PLT 177 08/04/2020      Chemistry      Component Value Date/Time   NA 139 08/04/2020 0944   NA 143 03/10/2017 0911   K 4.1 08/04/2020 0944   K 4.7 03/10/2017 0911   CL 104 08/04/2020 0944   CO2 28 08/04/2020 0944   CO2 30 (H) 03/10/2017 0911   BUN 8 08/04/2020 0944   BUN 10.7 03/10/2017 0911   CREATININE 0.76 08/04/2020 0944   CREATININE 0.8 03/10/2017 0911      Component Value Date/Time   CALCIUM 9.7 08/04/2020 0944   CALCIUM 9.4 03/10/2017 0911   ALKPHOS 70 08/04/2020 0944   ALKPHOS 76 03/10/2017 0911   AST 22 08/04/2020 0944   AST 34 03/10/2017 0911   ALT 21 08/04/2020 0944   ALT 45 03/10/2017 0911   BILITOT 1.1 08/04/2020 0944   BILITOT 1.41 (H) 03/10/2017 0911       RADIOGRAPHIC STUDIES: CT Chest W Contrast  Result Date: 07/31/2020 CLINICAL DATA:  Primary Cancer Type: Lung Imaging Indication: Assess response to therapy Interval therapy since last imaging? Yes Initial Cancer Diagnosis Date: 11/20/2019; Established by: Biopsy-proven Detailed Pathology: Stage IIIb non-small cell lung cancer, squamous cell carcinoma. Primary Tumor location:  Left upper lobe. Surgeries: No. Chemotherapy: Yes; Ongoing? No; Most recent administration: 01/14/2020 Immunotherapy?  Yes; Type: Imfinzi; Ongoing? Yes Radiation therapy? Yes; Date Range: 12/10/2019 - 01/21/2020; Target: Left lung. Other: Chronic lymphocytic leukemia, Stage 0. EXAM: CT CHEST WITH CONTRAST TECHNIQUE: Multidetector CT imaging of the chest was performed during intravenous contrast  administration. CONTRAST:  75mL OMNIPAQUE IOHEXOL 300 MG/ML  SOLN COMPARISON:  Most recent CT chest 05/12/2020.  11/09/2019 PET-CT. FINDINGS: Cardiovascular: Normal heart size. No significant pericardial effusion/thickening. Left anterior descending and left circumflex coronary atherosclerosis. Atherosclerotic thoracic aorta with stable dilated 4.1 cm ascending thoracic aorta. Normal caliber pulmonary arteries. No central pulmonary emboli. Mediastinum/Nodes: No discrete thyroid nodules. Unremarkable esophagus. No axillary adenopathy. Mildly enlarged 1.0 cm left paratracheal node (series 2/image 52), previously 1.0 cm using similar measurement technique, unchanged. No new pathologically enlarged mediastinal nodes. No pathologically enlarged hilar nodes. Lungs/Pleura: No pneumothorax. No pleural effusion. Moderate centrilobular and paraseptal emphysema with diffuse bronchial wall thickening. Peripheral apical right upper lobe irregular   1.0 cm nodule (series 5/image 34), previously 1.0 cm, stable. Separate 0.5 cm (series 5/image 69) and 0.4 cm (series 5/image 74) right upper lobe solid pulmonary nodules are stable. Masslike fibrosis in left upper lobe measures 5.5 x 3.3 cm (series 5/image 44) with associated volume loss and distortion, previously 5.8 x 3.4 cm, mildly decreased. Several new nodular foci of consolidation and regions of patchy tree-in-bud opacity scattered throughout the left lung, for example measuring 2.8 cm in the inferior lingula (series 5/image 101), 0.9 cm in the anterior left upper lobe (series 5/image 56) and 1.0 cm in the posterior left lower lobe (series 5/image 94). Upper abdomen: Cholelithiasis. Musculoskeletal: No aggressive appearing focal osseous lesions. Symmetric mild bilateral gynecomastia, stable. Moderate thoracic spondylosis. IMPRESSION: 1. Evolving masslike fibrosis in the left upper lobe, mildly decreased. 2. Several new nodular foci of consolidation and regions of patchy  tree-in-bud opacity scattered throughout the left lung, largest 2.8 cm in the inferior lingula. While more likely to represent multifocal bronchopneumonia, metastatic disease cannot be excluded and close chest CT follow-up recommended in 3 months. 3. Right upper lobe pulmonary nodules are stable. 4. Stable mild mediastinal lymphadenopathy. 5. Stable dilated 4.1 cm ascending thoracic aorta. Recommend annual imaging followup by CTA or MRA. This recommendation follows 2010 ACCF/AHA/AATS/ACR/ASA/SCA/SCAI/SIR/STS/SVM Guidelines for the Diagnosis and Management of Patients with Thoracic Aortic Disease. Circulation. 2010; 121: E266-e369. Aortic aneurysm NOS (ICD10-I71.9). 6. Two-vessel coronary atherosclerosis. 7. Cholelithiasis. 8. Aortic Atherosclerosis (ICD10-I70.0) and Emphysema (ICD10-J43.9). Electronically Signed   By: Jason A Poff M.D.   On: 07/31/2020 10:09   MYOCARDIAL PERFUSION IMAGING  Result Date: 07/17/2020  Nuclear stress EF: 64%.  There was no ST segment deviation noted during stress.  The study is normal.  This is a low risk study.  The left ventricular ejection fraction is normal (55-65%).  No ischemia or infarction on perfusion images. Normal wall motion.    ASSESSMENT AND PLAN: This is a very pleasant 74 years old white male with a stage IIIb non-small cell lung cancer, squamous cell carcinoma diagnosed in August of 2021.  The patient also has a history of stage 0 CLL.  He underwent a course of concurrent chemoradiation with weekly carboplatin for AUC of 2 and paclitaxel 45 mg/M2 status post 5 cycles of the treatment.  He tolerated this treatment well with no concerning adverse effect except for mild odynophagia and dysphagia as well as thrombocytopenia. His scan showed improvement of his disease with decrease in the size of the left upper lobe lung mass as well as decrease in the mediastinal lymphadenopathy and no evidence of metastatic disease. The patient is currently undergoing  consolidation treatment with immunotherapy with Imfinzi 1500 mg IV every 4 weeks status post 6 cycles. The patient continues to tolerate his treatment well with no concerning adverse effects. He had repeat CT scan of the chest performed recently.  I personally and independently reviewed the scan images and discussed the results with the patient today. His scan showed no concerning findings for disease progression but there was small areas of inflammatory process in the lungs bilaterally that need close monitoring on the upcoming imaging studies. I recommended for the patient to continue his current treatment with Imfinzi and he will proceed with cycle #7 today. The patient was advised to call immediately if he has any concerning symptoms in the interval. The patient voices understanding of current disease status and treatment options and is in agreement with the current care plan.  All questions were answered. The   patient knows to call the clinic with any problems, questions or concerns. We can certainly see the patient much sooner if necessary.  Disclaimer: This note was dictated with voice recognition software. Similar sounding words can inadvertently be transcribed and may not be corrected upon review.

## 2020-08-15 ENCOUNTER — Other Ambulatory Visit: Payer: Self-pay | Admitting: Internal Medicine

## 2020-08-19 ENCOUNTER — Other Ambulatory Visit (INDEPENDENT_AMBULATORY_CARE_PROVIDER_SITE_OTHER): Payer: Medicare Other

## 2020-08-19 DIAGNOSIS — E039 Hypothyroidism, unspecified: Secondary | ICD-10-CM

## 2020-08-19 LAB — TSH: TSH: 13.11 u[IU]/mL — ABNORMAL HIGH (ref 0.35–4.50)

## 2020-08-19 LAB — T3, FREE: T3, Free: 3.1 pg/mL (ref 2.3–4.2)

## 2020-08-19 LAB — T4, FREE: Free T4: 0.85 ng/dL (ref 0.60–1.60)

## 2020-08-25 ENCOUNTER — Encounter: Payer: Self-pay | Admitting: Internal Medicine

## 2020-08-25 ENCOUNTER — Ambulatory Visit (INDEPENDENT_AMBULATORY_CARE_PROVIDER_SITE_OTHER): Payer: Medicare Other | Admitting: Internal Medicine

## 2020-08-25 ENCOUNTER — Other Ambulatory Visit: Payer: Self-pay

## 2020-08-25 VITALS — BP 128/86 | HR 75 | Temp 98.5°F | Ht 71.0 in | Wt 200.0 lb

## 2020-08-25 DIAGNOSIS — E785 Hyperlipidemia, unspecified: Secondary | ICD-10-CM

## 2020-08-25 DIAGNOSIS — E039 Hypothyroidism, unspecified: Secondary | ICD-10-CM | POA: Diagnosis not present

## 2020-08-25 DIAGNOSIS — I2510 Atherosclerotic heart disease of native coronary artery without angina pectoris: Secondary | ICD-10-CM | POA: Diagnosis not present

## 2020-08-25 DIAGNOSIS — N320 Bladder-neck obstruction: Secondary | ICD-10-CM

## 2020-08-25 DIAGNOSIS — I480 Paroxysmal atrial fibrillation: Secondary | ICD-10-CM | POA: Diagnosis not present

## 2020-08-25 DIAGNOSIS — F411 Generalized anxiety disorder: Secondary | ICD-10-CM

## 2020-08-25 DIAGNOSIS — J449 Chronic obstructive pulmonary disease, unspecified: Secondary | ICD-10-CM | POA: Diagnosis not present

## 2020-08-25 DIAGNOSIS — I10 Essential (primary) hypertension: Secondary | ICD-10-CM | POA: Diagnosis not present

## 2020-08-25 MED ORDER — CLOTRIMAZOLE 1 % EX CREA: 1.0000 "application " | TOPICAL_CREAM | Freq: Two times a day (BID) | CUTANEOUS | 1 refills | Status: DC

## 2020-08-25 NOTE — Assessment & Plan Note (Signed)
Doing fair 

## 2020-08-25 NOTE — Assessment & Plan Note (Signed)
Cont w/Lovastatin

## 2020-08-25 NOTE — Assessment & Plan Note (Signed)
Cont w/Eliquis

## 2020-08-25 NOTE — Assessment & Plan Note (Signed)
On Amlodipine 

## 2020-08-25 NOTE — Assessment & Plan Note (Signed)
Abn TSH. FT3 and FT4 are normal. On Levothroid

## 2020-08-25 NOTE — Progress Notes (Signed)
Subjective:  Patient ID: Joe [name], male    DOB: [birth year]  Age: 75 y.o. MRN: [id number]  CC: Follow-up   HPI Joe [name] presents for lung cancer, abn TSH, COPD f/u  Outpatient Medications Prior to Visit  Medication Sig Dispense Refill  . amLODipine (NORVASC) 5 MG tablet TAKE 1 TABLET BY MOUTH EVERY DAY 90 tablet 2  . atenolol (TENORMIN) 25 MG tablet TAKE 1 TABLET BY MOUTH DAILY AS NEEDED FOR PALPITATIONS 90 tablet 3  . Cholecalciferol 25 MCG (1000 UT) tablet Take 1,000 Units by mouth daily.    . diazepam (VALIUM) 5 MG tablet TAKE 1 TABLET(5 MG) BY MOUTH EVERY 12 HOURS 180 tablet 1  . docusate sodium (COLACE) 100 MG capsule Take 100 mg by mouth 2 (two) times daily.    [name] ELIQUIS 5 MG TABS tablet TAKE 1 TABLET(5 MG) BY MOUTH TWICE DAILY 180 tablet 1  . levothyroxine (SYNTHROID) 100 MCG tablet Take 1 tablet (100 mcg total) by mouth daily. 30 tablet 2  . lovastatin (MEVACOR) 20 MG tablet TAKE 1 TABLET BY MOUTH EVERY NIGHT AT BEDTIME 90 tablet 2  . Multiple Vitamin (MULTIVITAMIN) tablet Take 1 tablet by mouth daily. Centrum Silver.    Joe [name] Glycol-Propyl Glycol (SYSTANE) 0.4-0.3 % SOLN Place 1 drop into both eyes daily as needed (Dry eyes). Ultra    . prochlorperazine (COMPAZINE) 10 MG tablet Take 1 tablet (10 mg total) by mouth every 6 (six) hours as needed. 30 tablet 2   No facility-administered medications prior to visit.    ROS: Review of Systems  Constitutional: Negative for appetite change, fatigue and unexpected weight change.  HENT: Negative for congestion, nosebleeds, sneezing, sore throat and trouble swallowing.   Eyes: Negative for itching and visual disturbance.  Respiratory: Negative for cough.   Cardiovascular: Negative for chest pain, palpitations and leg swelling.  Gastrointestinal: Negative for abdominal distention, blood in stool, diarrhea and nausea.  Genitourinary: Negative for frequency and hematuria.  Musculoskeletal: Negative for back pain,  gait problem, joint swelling and neck pain.  Skin: Negative for rash.  Neurological: Negative for dizziness, tremors, speech difficulty and weakness.  Psychiatric/Behavioral: Negative for agitation, dysphoric mood and sleep disturbance. The patient is not nervous/anxious.     Objective:  BP 128/86   Pulse 75   Temp 98.5 F (36.9 C) (Oral)   Ht 5\' 11"  (1.803 m)   Wt 200 lb (90.7 kg)   SpO2 98%   BMI 27.89 kg/m   BP Readings from Last 3 Encounters:  08/25/20 128/86  08/04/20 140/74  07/08/20 135/63    Wt Readings from Last 3 Encounters:  08/25/20 200 lb (90.7 kg)  08/04/20 200 lb 11.2 oz (91 kg)  07/17/20 200 lb (90.7 kg)    Physical Exam Constitutional:      General: He is not in acute distress.    Appearance: He is well-developed.     Comments: NAD  Eyes:     Conjunctiva/sclera: Conjunctivae normal.     Pupils: Pupils are equal, round, and reactive to light.  Neck:     Thyroid: No thyromegaly.     Vascular: No JVD.  Cardiovascular:     Rate and Rhythm: Normal rate and regular rhythm.     Heart sounds: Normal heart sounds. No murmur heard. No friction rub. No gallop.   Pulmonary:     Effort: Pulmonary effort is normal. No respiratory distress.     Breath sounds: Normal breath sounds. No wheezing or  rales.  Chest:     Chest wall: No tenderness.  Abdominal:     General: Bowel sounds are normal. There is no distension.     Palpations: Abdomen is soft. There is no mass.     Tenderness: There is no abdominal tenderness. There is no guarding or rebound.  Musculoskeletal:        General: No tenderness. Normal range of motion.     Cervical back: Normal range of motion.  Lymphadenopathy:     Cervical: No cervical adenopathy.  Skin:    General: Skin is warm and dry.     Findings: No rash.  Neurological:     Mental Status: He is alert and oriented to person, place, and time.     Cranial Nerves: No cranial nerve deficit.     Motor: No abnormal muscle tone.      Coordination: Coordination normal.     Gait: Gait abnormal.     Deep Tendon Reflexes: Reflexes are normal and symmetric.  Psychiatric:        Behavior: Behavior normal.        Thought Content: Thought content normal.        Judgment: Judgment normal.   Using a cane Rash on glance penis  Lab Results  Component Value Date   WBC 10.2 08/04/2020   HGB 14.7 08/04/2020   HCT 44.1 08/04/2020   PLT 177 08/04/2020   GLUCOSE 95 08/04/2020   CHOL 153 11/23/2018   TRIG 201.0 (H) 11/23/2018   HDL 38.20 (L) 11/23/2018   LDLDIRECT 99.0 11/23/2018   LDLCALC 80 11/07/2017   ALT 21 08/04/2020   AST 22 08/04/2020   NA 139 08/04/2020   K 4.1 08/04/2020   CL 104 08/04/2020   CREATININE 0.76 08/04/2020   BUN 8 08/04/2020   CO2 28 08/04/2020   TSH 13.11 (H) 08/19/2020   PSA 0.23 11/23/2018   INR 1.4 (H) 10/15/2019    CT Chest W Contrast  Result Date: 07/31/2020 CLINICAL DATA:  Primary Cancer Type: Lung Imaging Indication: Assess response to therapy Interval therapy since last imaging? Yes Initial Cancer Diagnosis Date: 11/20/2019; Established by: Biopsy-proven Detailed Pathology: Stage IIIb non-small cell lung cancer, squamous cell carcinoma. Primary Tumor location:  Left upper lobe. Surgeries: No. Chemotherapy: Yes; Ongoing? No; Most recent administration: 01/14/2020 Immunotherapy?  Yes; Type: Imfinzi; Ongoing? Yes Radiation therapy? Yes; Date Range: 12/10/2019 - 01/21/2020; Target: Left lung. Other: Chronic lymphocytic leukemia, Stage 0. EXAM: CT CHEST WITH CONTRAST TECHNIQUE: Multidetector CT imaging of the chest was performed during intravenous contrast administration. CONTRAST:  78mL OMNIPAQUE IOHEXOL 300 MG/ML  SOLN COMPARISON:  Most recent CT chest 05/12/2020.  11/09/2019 PET-CT. FINDINGS: Cardiovascular: Normal heart size. No significant pericardial effusion/thickening. Left anterior descending and left circumflex coronary atherosclerosis. Atherosclerotic thoracic aorta with stable dilated 4.1  cm ascending thoracic aorta. Normal caliber pulmonary arteries. No central pulmonary emboli. Mediastinum/Nodes: No discrete thyroid nodules. Unremarkable esophagus. No axillary adenopathy. Mildly enlarged 1.0 cm left paratracheal node (series 2/image 52), previously 1.0 cm using similar measurement technique, unchanged. No new pathologically enlarged mediastinal nodes. No pathologically enlarged hilar nodes. Lungs/Pleura: No pneumothorax. No pleural effusion. Moderate centrilobular and paraseptal emphysema with diffuse bronchial wall thickening. Peripheral apical right upper lobe irregular 1.0 cm nodule (series 5/image 34), previously 1.0 cm, stable. Separate 0.5 cm (series 5/image 69) and 0.4 cm (series 5/image 74) right upper lobe solid pulmonary nodules are stable. Masslike fibrosis in left upper lobe measures 5.5 x 3.3 cm (series 5/image 44)  with associated volume loss and distortion, previously 5.8 x 3.4 cm, mildly decreased. Several new nodular foci of consolidation and regions of patchy tree-in-bud opacity scattered throughout the left lung, for example measuring 2.8 cm in the inferior lingula (series 5/image 101), 0.9 cm in the anterior left upper lobe (series 5/image 56) and 1.0 cm in the posterior left lower lobe (series 5/image 94). Upper abdomen: Cholelithiasis. Musculoskeletal: No aggressive appearing focal osseous lesions. Symmetric mild bilateral gynecomastia, stable. Moderate thoracic spondylosis. IMPRESSION: 1. Evolving masslike fibrosis in the left upper lobe, mildly decreased. 2. Several new nodular foci of consolidation and regions of patchy tree-in-bud opacity scattered throughout the left lung, largest 2.8 cm in the inferior lingula. While more likely to represent multifocal bronchopneumonia, metastatic disease cannot be excluded and close chest CT follow-up recommended in 3 months. 3. Right upper lobe pulmonary nodules are stable. 4. Stable mild mediastinal lymphadenopathy. 5. Stable dilated  4.1 cm ascending thoracic aorta. Recommend annual imaging followup by CTA or MRA. This recommendation follows 2010 ACCF/AHA/AATS/ACR/ASA/SCA/SCAI/SIR/STS/SVM Guidelines for the Diagnosis and Management of Patients with Thoracic Aortic Disease. Circulation. 2010; 121: R154-M086. Aortic aneurysm NOS (ICD10-I71.9). 6. Two-vessel coronary atherosclerosis. 7. Cholelithiasis. 8. Aortic Atherosclerosis (ICD10-I70.0) and Emphysema (ICD10-J43.9). Electronically Signed   By: Ilona Sorrel M.D.   On: 07/31/2020 10:09    Assessment & Plan:    Walker Kehr, MD

## 2020-08-25 NOTE — Assessment & Plan Note (Signed)
Diazepam prn  Potential benefits of a long term benzodiazepines  use as well as potential risks  and complications were explained to the patient and were aknowledged. 

## 2020-09-01 ENCOUNTER — Inpatient Hospital Stay: Payer: Medicare Other

## 2020-09-01 ENCOUNTER — Other Ambulatory Visit: Payer: Self-pay

## 2020-09-01 ENCOUNTER — Inpatient Hospital Stay: Payer: Medicare Other | Attending: Hematology

## 2020-09-01 ENCOUNTER — Inpatient Hospital Stay (HOSPITAL_BASED_OUTPATIENT_CLINIC_OR_DEPARTMENT_OTHER): Payer: Medicare Other | Admitting: Internal Medicine

## 2020-09-01 VITALS — BP 123/66 | HR 74 | Temp 97.6°F | Resp 19 | Ht 71.0 in | Wt 199.9 lb

## 2020-09-01 DIAGNOSIS — E039 Hypothyroidism, unspecified: Secondary | ICD-10-CM | POA: Diagnosis not present

## 2020-09-01 DIAGNOSIS — C9110 Chronic lymphocytic leukemia of B-cell type not having achieved remission: Secondary | ICD-10-CM | POA: Diagnosis not present

## 2020-09-01 DIAGNOSIS — C3412 Malignant neoplasm of upper lobe, left bronchus or lung: Secondary | ICD-10-CM | POA: Diagnosis not present

## 2020-09-01 DIAGNOSIS — Z5112 Encounter for antineoplastic immunotherapy: Secondary | ICD-10-CM | POA: Insufficient documentation

## 2020-09-01 DIAGNOSIS — I10 Essential (primary) hypertension: Secondary | ICD-10-CM

## 2020-09-01 DIAGNOSIS — C3492 Malignant neoplasm of unspecified part of left bronchus or lung: Secondary | ICD-10-CM

## 2020-09-01 DIAGNOSIS — D696 Thrombocytopenia, unspecified: Secondary | ICD-10-CM | POA: Insufficient documentation

## 2020-09-01 LAB — CMP (CANCER CENTER ONLY)
ALT: 25 U/L (ref 0–44)
AST: 22 U/L (ref 15–41)
Albumin: 3.8 g/dL (ref 3.5–5.0)
Alkaline Phosphatase: 73 U/L (ref 38–126)
Anion gap: 11 (ref 5–15)
BUN: 8 mg/dL (ref 8–23)
CO2: 26 mmol/L (ref 22–32)
Calcium: 9.6 mg/dL (ref 8.9–10.3)
Chloride: 105 mmol/L (ref 98–111)
Creatinine: 0.79 mg/dL (ref 0.61–1.24)
GFR, Estimated: >60 mL/min (ref >60)
Glucose, Bld: 106 mg/dL — ABNORMAL HIGH (ref 70–99)
Potassium: 4.1 mmol/L (ref 3.5–5.1)
Sodium: 142 mmol/L (ref 135–145)
Total Bilirubin: 1.1 mg/dL (ref 0.3–1.2)
Total Protein: 7.3 g/dL (ref 6.5–8.1)

## 2020-09-01 LAB — CBC WITH DIFFERENTIAL (CANCER CENTER ONLY)
Abs Immature Granulocytes: 0.05 10*3/uL (ref 0.00–0.07)
Basophils Absolute: 0 10*3/uL (ref 0.0–0.1)
Basophils Relative: 0 %
Eosinophils Absolute: 0.3 10*3/uL (ref 0.0–0.5)
Eosinophils Relative: 3 %
HCT: 44.8 % (ref 39.0–52.0)
Hemoglobin: 14.7 g/dL (ref 13.0–17.0)
Immature Granulocytes: 1 %
Lymphocytes Relative: 22 %
Lymphs Abs: 2.3 10*3/uL (ref 0.7–4.0)
MCH: 30.2 pg (ref 26.0–34.0)
MCHC: 32.8 g/dL (ref 30.0–36.0)
MCV: 92 fL (ref 80.0–100.0)
Monocytes Absolute: 1.2 10*3/uL — ABNORMAL HIGH (ref 0.1–1.0)
Monocytes Relative: 12 %
Neutro Abs: 6.7 10*3/uL (ref 1.7–7.7)
Neutrophils Relative %: 62 %
Platelet Count: 181 10*3/uL (ref 150–400)
RBC: 4.87 MIL/uL (ref 4.22–5.81)
RDW: 12.8 % (ref 11.5–15.5)
WBC Count: 10.5 10*3/uL (ref 4.0–10.5)
nRBC: 0 % (ref 0.0–0.2)

## 2020-09-01 LAB — TSH: TSH: 14.299 u[IU]/mL — ABNORMAL HIGH (ref 0.320–4.118)

## 2020-09-01 MED ORDER — SODIUM CHLORIDE 0.9 % IV SOLN
Freq: Once | INTRAVENOUS | Status: AC
  Filled 2020-09-01: qty 250

## 2020-09-01 MED ORDER — SODIUM CHLORIDE 0.9 % IV SOLN
1500.0000 mg | Freq: Once | INTRAVENOUS | Status: AC
  Administered 2020-09-01: 1500 mg via INTRAVENOUS
  Filled 2020-09-01: qty 30

## 2020-09-01 NOTE — Patient Instructions (Signed)
[facility]  Discharge Instructions: Thank you for choosing [facility] to provide your oncology and hematology care.   If you have a lab appointment with the Cancer Center, please go directly to the Cancer Center and check in at the registration area.   Wear comfortable clothing and clothing appropriate for easy access to any Portacath or PICC line.   We strive to give you quality time with your provider. You may need to reschedule your appointment if you arrive late (15 or more minutes).  Arriving late affects you and other patients whose appointments are after yours.  Also, if you miss three or more appointments without notifying the office, you may be dismissed from the clinic at the provider's discretion.      For prescription refill requests, have your pharmacy contact our office and allow 72 hours for refills to be completed.    Today you received the following chemotherapy and/or immunotherapy agents imfinzi       To help prevent nausea and vomiting after your treatment, we encourage you to take your nausea medication as directed.  BELOW ARE SYMPTOMS THAT SHOULD BE REPORTED IMMEDIATELY: *FEVER GREATER THAN 100.4 F (38 C) OR HIGHER *CHILLS OR SWEATING *NAUSEA AND VOMITING THAT IS NOT CONTROLLED WITH YOUR NAUSEA MEDICATION *UNUSUAL SHORTNESS OF BREATH *UNUSUAL BRUISING OR BLEEDING *URINARY PROBLEMS (pain or burning when urinating, or frequent urination) *BOWEL PROBLEMS (unusual diarrhea, constipation, pain near the anus) TENDERNESS IN MOUTH AND THROAT WITH OR WITHOUT PRESENCE OF ULCERS (sore throat, sores in mouth, or a toothache) UNUSUAL RASH, SWELLING OR PAIN  UNUSUAL VAGINAL DISCHARGE OR ITCHING   Items with * indicate a potential emergency and should be followed up as soon as possible or go to the Emergency Department if any problems should occur.  Please show the CHEMOTHERAPY ALERT CARD or IMMUNOTHERAPY ALERT CARD at check-in to  the Emergency Department and triage nurse.  Should you have questions after your visit or need to cancel or reschedule your appointment, please contact [facility]  Dept: [phone]  and follow the prompts.  Office hours are 8:00 a.m. to 4:30 p.m. Monday - Friday. Please note that voicemails left after 4:00 p.m. may not be returned until the following business day.  We are closed weekends and major holidays. You have access to a nurse at all times for urgent questions. Please call the main number to the clinic Dept: [phone] and follow the prompts.   For any non-urgent questions, you may also contact your provider using MyChart. We now offer e-Visits for anyone 18 and older to request care online for non-urgent symptoms. For details visit [email or web address].   Also download the MyChart app! Go to the app store, search "MyChart", open the app, select [facility], and log in with your MyChart username and password.  Due to Covid, a mask is required upon entering the hospital/clinic. If you do not have a mask, one will be given to you upon arrival. For doctor visits, patients may have 1 support person aged 18 or older with them. For treatment visits, patients cannot have anyone with them due to current Covid guidelines and our immunocompromised population.   

## 2020-09-01 NOTE — Progress Notes (Signed)
[facility] [phone]   [phone]  OFFICE PROGRESS NOTE  [name], [name], MD [place] [address]  DIAGNOSIS: 1) Stage IIIb non-small cell lung cancer, squamous cell carcinoma.  The patient presented with a left upper lobe lung mass as well as associated mediastinal lymphadenopathy and contralateral right hilar mild hypermetabolism.  There was heterogeneous marrow uptake in the spine but with more focal areas at L4 without imaging correlation on CT scan.  The patient was diagnosed in August 2021. 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  First dose expected on 12/13/2019. Status post 5 cycles.   Last dose was giving January 14, 2020.   CURRENT THERAPY:  Consolidation treatment with immunotherapy with Imfinzi 1500 mg IV every 4 weeks.  First dose February 19, 2020.  Status post 7 cycles.  INTERVAL HISTORY: Joe [name] 75 y.o. male returns to the clinic today for follow-up visit.  The patient is feeling fine today with no concerning complaints.  He denied having any chest pain, shortness of breath except with exertion with no cough or hemoptysis.  He denied having any fever or chills.  He has no nausea, vomiting, diarrhea or constipation.  He denied having any headache or visual changes.  He continues to tolerate his treatment with Imfinzi fairly well.  The patient is here today for evaluation before starting cycle #8.  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  MEDICATIONS:  Current Outpatient Medications  Medication Sig Dispense Refill   amLODipine (NORVASC) 5 MG tablet TAKE 1 TABLET  BY MOUTH EVERY DAY 90 tablet 2   atenolol (TENORMIN) 25 MG tablet TAKE 1 TABLET BY MOUTH DAILY AS NEEDED FOR PALPITATIONS 90 tablet 3   Cholecalciferol 25 MCG (1000 UT) tablet Take 1,000 Units by mouth daily.     clotrimazole (LOTRIMIN) 1 % cream Apply 1 application topically 2 (two) times daily. 30 g 1   diazepam (VALIUM) 5 MG tablet TAKE 1 TABLET(5 MG) BY MOUTH EVERY 12 HOURS 180 tablet 1   docusate sodium (COLACE) 100 MG capsule Take 100 mg by mouth 2 (two) times daily.     ELIQUIS 5 MG TABS tablet TAKE 1 TABLET(5 MG) BY MOUTH TWICE DAILY 180 tablet 1   levothyroxine (SYNTHROID) 100 MCG tablet Take 1 tablet (100 mcg total) by mouth daily. 30 tablet 2   lovastatin (MEVACOR) 20 MG tablet TAKE 1 TABLET BY MOUTH EVERY NIGHT AT BEDTIME 90  tablet 2   Multiple Vitamin (MULTIVITAMIN) tablet Take 1 tablet by mouth daily. Centrum Silver.     Polyethyl Glycol-Propyl Glycol (SYSTANE) 0.4-0.3 % SOLN Place 1 drop into both eyes daily as needed (Dry eyes). Ultra     prochlorperazine (COMPAZINE) 10 MG tablet Take 1 tablet (10 mg total) by mouth every 6 (six) hours as needed. 30 tablet 2   No current facility-administered medications for this visit.    SURGICAL HISTORY:  Past Surgical History:  Procedure Laterality Date   BICEPS TENDON REPAIR     BRONCHIAL BIOPSY  10/23/2019   Procedure: BRONCHIAL BIOPSIES;  Surgeon: [name], MD;  Location: [facility] ENDOSCOPY;  Service: Pulmonary;;   BRONCHIAL BIOPSY  11/20/2019   Procedure: BRONCHIAL BIOPSIES;  Surgeon: [name], MD;  Location: [facility] ENDOSCOPY;  Service: Pulmonary;;   BRONCHIAL BRUSHINGS  10/23/2019   Procedure: BRONCHIAL BRUSHINGS;  Surgeon: [name], MD;  Location: [facility] ENDOSCOPY;  Service: Pulmonary;;   BRONCHIAL BRUSHINGS  11/20/2019   Procedure: BRONCHIAL BRUSHINGS;  Surgeon: [name], MD;  Location: [facility] ENDOSCOPY;  Service: Pulmonary;;   BRONCHIAL NEEDLE ASPIRATION BIOPSY  10/23/2019   Procedure: BRONCHIAL NEEDLE ASPIRATION BIOPSIES;  Surgeon: [name], MD;  Location: [facility] ENDOSCOPY;  Service: Pulmonary;;   BRONCHIAL NEEDLE ASPIRATION BIOPSY  11/20/2019   Procedure: BRONCHIAL NEEDLE ASPIRATION BIOPSIES;  Surgeon: [name], MD;  Location: [facility] ENDOSCOPY;  Service: Pulmonary;;   BRONCHIAL WASHINGS  11/20/2019   Procedure: BRONCHIAL WASHINGS;  Surgeon: [name], MD;  Location: [facility];  Service: Pulmonary;;   CATARACT EXTRACTION Left    COLONOSCOPY  04-14-99   Dr Flossie Dibble polyp-TA in epic   POLYPECTOMY  04-14-99   POSTERIOR LAMINECTOMY / DECOMPRESSION CERVICAL SPINE     Dr Saintclair Halsted   RETINAL DETACHMENT SURGERY     left eye, 2007 x2, 2008 x 3   ROTATOR CUFF REPAIR  2004   right   TONSILLECTOMY  [id number],[id number]   VIDEO BRONCHOSCOPY WITH ENDOBRONCHIAL  NAVIGATION N/A 10/23/2019   Procedure: VIDEO BRONCHOSCOPY WITH ENDOBRONCHIAL NAVIGATION;  Surgeon: [name], MD;  Location: [facility] ENDOSCOPY;  Service: Pulmonary;  Laterality: N/A;   VIDEO BRONCHOSCOPY WITH ENDOBRONCHIAL NAVIGATION N/A 11/20/2019   Procedure: VIDEO BRONCHOSCOPY WITH ENDOBRONCHIAL NAVIGATION;  Surgeon: [name], MD;  Location: [facility] ENDOSCOPY;  Service: Pulmonary;  Laterality: N/A;   VIDEO BRONCHOSCOPY WITH ENDOBRONCHIAL ULTRASOUND N/A 10/23/2019   Procedure: VIDEO BRONCHOSCOPY WITH ENDOBRONCHIAL ULTRASOUND;  Surgeon: [name], MD;  Location: [facility] ENDOSCOPY;  Service: Pulmonary;  Laterality: N/A;    REVIEW OF SYSTEMS:  A  comprehensive review of systems was negative except for: Respiratory: positive for dyspnea on exertion   PHYSICAL EXAMINATION: General appearance: alert, cooperative, and no distress Head: Normocephalic, without obvious abnormality, atraumatic Neck: no adenopathy, no JVD, supple, symmetrical, trachea midline, and thyroid not enlarged, symmetric, no tenderness/mass/nodules Lymph nodes: Cervical, supraclavicular, and axillary nodes normal. Resp: clear to auscultation bilaterally Back: symmetric, no curvature. ROM normal. No CVA tenderness. Cardio: regular rate and rhythm, S1, S2 normal, no murmur, click, rub or gallop GI: soft, non-tender; bowel sounds normal; no masses,  no organomegaly Extremities: extremities normal, atraumatic, no cyanosis or edema  ECOG PERFORMANCE STATUS: 1 - Symptomatic but completely ambulatory  Blood pressure 123/66, pulse 74, temperature 97.6 F (36.4 C), temperature source Tympanic, resp. rate 19, height _0  (1.803 m), weight 199 lb 14.4 oz (90.7 kg), SpO2 90 %.  LABORATORY DATA: Lab Results  Component Value Date   WBC 10.5 09/01/2020   HGB 14.7 09/01/2020   HCT 44.8 09/01/2020   MCV 92.0 09/01/2020   PLT 181 09/01/2020      Chemistry      Component Value Date/Time   NA 139 08/04/2020 0944   NA 143 03/10/2017  0911   K 4.1 08/04/2020 0944   K 4.7 03/10/2017 0911   CL 104 08/04/2020 0944   CO2 28 08/04/2020 0944   CO2 30 (H) 03/10/2017 0911   BUN 8 08/04/2020 0944   BUN 10.7 03/10/2017 0911   CREATININE 0.76 08/04/2020 0944   CREATININE 0.8 03/10/2017 0911      Component Value Date/Time   CALCIUM 9.7 08/04/2020 0944   CALCIUM 9.4 03/10/2017 0911   ALKPHOS 70 08/04/2020 0944   ALKPHOS 76 03/10/2017 0911   AST 22 08/04/2020 0944   AST 34 03/10/2017 0911   ALT 21 08/04/2020 0944   ALT 45 03/10/2017 0911   BILITOT 1.1 08/04/2020 0944   BILITOT 1.41 (H) 03/10/2017 0911       RADIOGRAPHIC STUDIES: No results found.   ASSESSMENT AND PLAN: This is a very pleasant 75 years old white male with a stage IIIb non-small cell lung cancer, squamous cell carcinoma diagnosed in August of 2021.  The patient also has a history of stage 0 CLL.  He underwent a course of concurrent chemoradiation with weekly carboplatin for AUC of 2 and paclitaxel 45 mg/M2 status post 5 cycles of the treatment.  He tolerated this treatment well with no concerning adverse effect except for mild odynophagia and dysphagia as well as thrombocytopenia. His scan showed improvement of his disease with decrease in the size of the left upper lobe lung mass as well as decrease in the mediastinal lymphadenopathy and no evidence of metastatic disease. The patient is currently undergoing consolidation treatment with immunotherapy with Imfinzi 1500 mg IV every 4 weeks status post 7 cycles. The patient continues to tolerate his treatment with immunotherapy fairly well. I recommended for him to proceed with cycle #8 today as planned. He will come back for follow-up visit in 4 weeks for evaluation before starting cycle #9. For the hypothyroidism, he will continue with levothyroxine 100 mcg p.o. daily and will adjust his dose accordingly depending on the TSH. The patient was advised to call immediately if he has any other concerning symptoms  in the interval. The patient voices understanding of current disease status and treatment options and is in agreement with the current care plan.  All questions were answered. The patient knows to call the clinic with any problems, questions or concerns.  We can certainly see the patient much sooner if necessary.  Disclaimer: This note was dictated with voice recognition software. Similar sounding words can inadvertently be transcribed and may not be corrected upon review.

## 2020-09-03 ENCOUNTER — Other Ambulatory Visit: Payer: Self-pay | Admitting: Physician Assistant

## 2020-09-03 DIAGNOSIS — E039 Hypothyroidism, unspecified: Secondary | ICD-10-CM

## 2020-09-03 MED ORDER — LEVOTHYROXINE SODIUM 112 MCG PO TABS: 112.0000 ug | ORAL_TABLET | Freq: Every day | ORAL | 2 refills | Status: DC

## 2020-09-04 ENCOUNTER — Telehealth: Payer: Self-pay | Admitting: Internal Medicine

## 2020-09-04 NOTE — Telephone Encounter (Signed)
Scheduled per los. Called and left msg. Mailed printout  °

## 2020-09-26 NOTE — Progress Notes (Signed)
[facility] OFFICE PROGRESS NOTE  [name], [name], MD [place] [address]  DIAGNOSIS: 1) Stage IIIb non-small cell lung cancer, squamous cell carcinoma.  The patient presented with a left upper lobe lung mass as well as associated mediastinal lymphadenopathy and contralateral right hilar mild hypermetabolism.  There was heterogeneous marrow uptake in the spine but with more focal areas at L4 without imaging correlation on CT scan.  The patient was diagnosed in August 2021. 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  First dose expected on 12/13/2019. Status post 5 cycles.   Last dose was giving January 14, 2020.  CURRENT THERAPY: Consolidation treatment with immunotherapy with Imfinzi 1500 mg IV every 4 weeks.  First dose February 19, 2020.  Status post 8 cycles.  INTERVAL HISTORY: Joe [name] 75 y.o. male returns to the clinic today for a follow-up visit.  The patient is feeling well today without any concerning complaints. The patient currently has fleas which he acquired from his pets. His pets are undergoing the correct treatment for this but he is in the process of cleaning his house/carpet. Otherwise, the patient denies any recent fever, chills, or night sweats. He lost a few pounds because he is trying to cut out soda, sugar, and ice cream. He denies any chest pain, cough, or hemoptysis. He has some dyspnea on exertion with carrying groceries or being active. He has a mild cough 2-3x per day and most of the time it is in the morning. He uses a CPAP for sleep apnea. He denies any nausea, vomiting, diarrhea.  He takes Colace for baseline constipation.  He denies any headaches. He follows with  for his history of retinal detachment. He thinks he has some increase in his "floaters". He also has eyedrops for dry eyes.   He denies any rashes or skin changes.  The patient is here today for evaluation before starting cycle #9.   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  MEDICATIONS:  Current Outpatient Medications  Medication Sig Dispense Refill   amLODipine (NORVASC) 5 MG tablet TAKE 1 TABLET BY MOUTH EVERY DAY 90 tablet 2   atenolol (TENORMIN) 25 MG tablet TAKE 1 TABLET BY MOUTH DAILY AS NEEDED FOR PALPITATIONS 90 tablet 3   Cholecalciferol 25 MCG (1000 UT) tablet Take 1,000 Units by mouth daily.     clotrimazole (LOTRIMIN) 1 % cream Apply 1 application  topically 2 (two) times daily. 30 g 1   diazepam (VALIUM) 5 MG tablet TAKE 1 TABLET(5 MG) BY MOUTH EVERY 12 HOURS 180 tablet 1   docusate sodium (COLACE) 100 MG capsule Take 100 mg by mouth 2 (two) times daily.     ELIQUIS 5 MG TABS tablet TAKE 1 TABLET(5 MG) BY MOUTH TWICE DAILY 180 tablet 1   levothyroxine (SYNTHROID) 112 MCG tablet Take 1 tablet (112 mcg total) by mouth daily. 30 tablet 2   lovastatin (MEVACOR) 20 MG tablet TAKE 1 TABLET BY MOUTH EVERY NIGHT AT BEDTIME 90 tablet 2   Multiple Vitamin (MULTIVITAMIN) tablet Take 1 tablet by mouth daily. Centrum Silver.     Polyethyl Glycol-Propyl Glycol (SYSTANE) 0.4-0.3 % SOLN Place 1 drop into both eyes daily as needed (Dry eyes). Ultra     prochlorperazine (COMPAZINE) 10 MG tablet Take 1 tablet (10 mg total) by mouth every 6 (six) hours as needed. 30 tablet 2   No current facility-administered medications for this visit.    SURGICAL HISTORY:  Past Surgical History:  Procedure Laterality Date   BICEPS TENDON REPAIR     BRONCHIAL BIOPSY  10/23/2019   Procedure: BRONCHIAL BIOPSIES;  Surgeon: [name], MD;  Location: [facility] ENDOSCOPY;  Service: Pulmonary;;   BRONCHIAL BIOPSY  11/20/2019   Procedure: BRONCHIAL BIOPSIES;  Surgeon: [name], MD;  Location: [facility] ENDOSCOPY;  Service: Pulmonary;;   BRONCHIAL BRUSHINGS  10/23/2019   Procedure: BRONCHIAL BRUSHINGS;  Surgeon: [name], MD;  Location: [facility] ENDOSCOPY;  Service: Pulmonary;;   BRONCHIAL BRUSHINGS  11/20/2019   Procedure: BRONCHIAL BRUSHINGS;  Surgeon: [name], MD;  Location: [facility] ENDOSCOPY;  Service: Pulmonary;;   BRONCHIAL NEEDLE ASPIRATION BIOPSY  10/23/2019   Procedure: BRONCHIAL NEEDLE ASPIRATION BIOPSIES;  Surgeon: [name], MD;  Location: [facility] ENDOSCOPY;  Service: Pulmonary;;   BRONCHIAL NEEDLE ASPIRATION BIOPSY  11/20/2019   Procedure: BRONCHIAL NEEDLE ASPIRATION BIOPSIES;  Surgeon: [name], MD;  Location: [facility] ENDOSCOPY;  Service: Pulmonary;;   BRONCHIAL  WASHINGS  11/20/2019   Procedure: BRONCHIAL WASHINGS;  Surgeon: [name], MD;  Location: [facility] ENDOSCOPY;  Service: Pulmonary;;   CATARACT EXTRACTION Left    COLONOSCOPY  04-14-99   Dr Flossie Dibble polyp-TA in epic   POLYPECTOMY  04-14-99   POSTERIOR LAMINECTOMY / DECOMPRESSION CERVICAL SPINE     Dr Saintclair Halsted   RETINAL DETACHMENT SURGERY     left eye, 2007 x2, 2008 x 3   ROTATOR CUFF REPAIR  2004   right   TONSILLECTOMY  [id number]   VIDEO BRONCHOSCOPY WITH ENDOBRONCHIAL NAVIGATION N/A 10/23/2019   Procedure: VIDEO BRONCHOSCOPY WITH ENDOBRONCHIAL NAVIGATION;  Surgeon:  [name], MD;  Location: [facility] ENDOSCOPY;  Service: Pulmonary;  Laterality: N/A;   VIDEO BRONCHOSCOPY WITH ENDOBRONCHIAL NAVIGATION N/A 11/20/2019   Procedure: VIDEO BRONCHOSCOPY WITH ENDOBRONCHIAL NAVIGATION;  Surgeon: [name], MD;  Location: [facility] ENDOSCOPY;  Service: Pulmonary;  Laterality: N/A;   VIDEO BRONCHOSCOPY WITH ENDOBRONCHIAL ULTRASOUND N/A 10/23/2019   Procedure: VIDEO BRONCHOSCOPY WITH ENDOBRONCHIAL ULTRASOUND;  Surgeon: [name], MD;  Location: [facility] ENDOSCOPY;  Service: Pulmonary;  Laterality: N/A;    REVIEW OF SYSTEMS:   Review of Systems Constitutional: Negative for appetite change, chills, fatigue, fever and unexpected weight change. HENT: Negative for mouth sores, nosebleeds, sore throat and trouble swallowing.   Eyes: Negative for eye problems and icterus. Respiratory: Positive for dyspnea on exertion and mild cough. Negative for hemoptysis and wheezing.   Cardiovascular: Negative for chest pain and leg swelling.  Gastrointestinal: Positive for frequent constipation and occasional bloating. Negative for diarrhea, nausea and vomiting.  Genitourinary: Negative for bladder incontinence, difficulty urinating, dysuria, frequency and hematuria.   Musculoskeletal: Negative for back pain, gait problem, neck pain and neck stiffness. Skin: Negative for itching and rash. Neurological: Negative for dizziness,  extremity weakness, gait problem, headaches, light-headedness and seizures. Hematological: Negative for adenopathy. Does not bruise/bleed easily. Psychiatric/Behavioral: Negative for confusion, depression and sleep disturbance. The patient is not nervous/anxious.      PHYSICAL EXAMINATION:  There were no vitals taken for this visit.  ECOG PERFORMANCE STATUS: 1  Physical Exam  Constitutional: Oriented to person, place, and time and well-developed, well-nourished, and in no distress.  HENT: Head: Normocephalic and atraumatic. Mouth/Throat: Oropharynx is clear and moist. No oropharyngeal exudate. Eyes: Conjunctivae are normal. Right eye exhibits no discharge. Left eye exhibits no discharge. No scleral icterus. Neck: Normal range of motion. Neck supple. Cardiovascular: Normal rate, regular rhythm, normal heart sounds and intact distal pulses.   Pulmonary/Chest: Effort normal. Quiet breath sounds in all lung fields. No respiratory distress. No wheezes. No rales.  Abdominal: Soft. Bowel sounds are normal. Exhibits no distension and no mass. There is no tenderness.  Musculoskeletal: Normal range of motion. Exhibits no edema.  Lymphadenopathy:    No cervical adenopathy.  Neurological: Alert and oriented to person, place, and time. Exhibits normal muscle tone. Gait normal. Coordination normal. Ambulates with a cane.  Skin: Positive for fleas. Skin is warm and dry. No rash noted. Not diaphoretic. No erythema. No pallor.  Psychiatric: Mood, memory and judgment normal. Vitals reviewed.  LABORATORY DATA: Lab Results  Component Value Date   WBC 10.5 09/01/2020   HGB 14.7 09/01/2020   HCT 44.8 09/01/2020   MCV 92.0 09/01/2020   PLT 181 09/01/2020      Chemistry      Component Value Date/Time   NA 142 09/01/2020 0832   NA 143 03/10/2017 0911   K 4.1 09/01/2020 0832   K 4.7 03/10/2017 0911   CL 105 09/01/2020 0832   CO2 26 09/01/2020 0832   CO2 30 (H) 03/10/2017 0911   BUN 8 09/01/2020  0832   BUN 10.7 03/10/2017 0911   CREATININE 0.79 09/01/2020 0832   CREATININE 0.8 03/10/2017 0911      Component Value Date/Time   CALCIUM 9.6 09/01/2020 0832   CALCIUM 9.4 03/10/2017 0911   ALKPHOS 73 09/01/2020 0832   ALKPHOS 76 03/10/2017 0911   AST 22 09/01/2020 0832   AST 34 03/10/2017 0911   ALT 25 09/01/2020 0832   ALT 45 03/10/2017 0911   BILITOT 1.1 09/01/2020 5053  BILITOT 1.41 (H) 03/10/2017 0911       RADIOGRAPHIC STUDIES:  No results found.   ASSESSMENT/PLAN:  This is a very pleasant 75 year old Caucasian male referred to the clinic for evaluation of stage IIIb non-small cell lung cancer, squamous cell carcinoma.  The patient presented with a left upper lobe lung mass as well as associated mediastinal lymphadenopathy and contralateral right hilar mild hypermetabolism.  There was heterogeneous marrow uptake in general in the spine but with more focal areas on the L4 level without imaging correlation on CT.  His PDL1 expression is 2%. He was diagnosed in August 2021. The patient also has a  stage 0 CLL.   He underwent a course of concurrent chemoradiation with weekly carboplatin for AUC of 2 and paclitaxel 45 mg/M2 status post 5 cycles of the treatment.  He tolerated this treatment well with no concerning adverse effect except for mild odynophagia and dysphagia as well as thrombocytopenia.  The patient is currently undergoing consolidation treatment with immunotherapy with Imfinzi 1500 mg IV every 4 weeks status post 8 cycles. He tolerates this well.  Labs were reviewed. Recommend that he proceed with cycle #9 today as scheduled.   We will see him back for a follow up visit in 4 weeks for evaluation before starting cycle #10   I will arrange for restaging CT scan prior to starting the next cycle of treatment.  The patient has fleas presently which he acquired from his cats. We will ensure to get him into a private room today.   We will continue to monitor his TSH  closely. His dose was adjusted 4 weeks ago of his synthroid.   Recommend he follow up with his opthalmology if needed for increased floaters. He is going to monitor for now but will follow up if his symptoms persist or worsen.   The patient was advised to call immediately if he has any concerning symptoms in the interval. The patient voices understanding of current disease status and treatment options and is in agreement with the current care plan. All questions were answered. The patient knows to call the clinic with any problems, questions or concerns. We can certainly see the patient much sooner if necessary             No orders of the defined types were placed in this encounter.   The total time spent in the appointment was 20-29 minutes in this encounter.  Saidi Santacroce L Anakaren Campion, PA-C 09/26/20

## 2020-09-29 ENCOUNTER — Inpatient Hospital Stay: Payer: Medicare Other | Attending: Hematology

## 2020-09-29 ENCOUNTER — Inpatient Hospital Stay: Payer: Medicare Other

## 2020-09-29 ENCOUNTER — Other Ambulatory Visit: Payer: Self-pay

## 2020-09-29 ENCOUNTER — Inpatient Hospital Stay (HOSPITAL_BASED_OUTPATIENT_CLINIC_OR_DEPARTMENT_OTHER): Payer: Medicare Other | Admitting: Physician Assistant

## 2020-09-29 VITALS — BP 127/53 | HR 69 | Temp 97.8°F | Resp 17 | Wt 197.3 lb

## 2020-09-29 DIAGNOSIS — G4733 Obstructive sleep apnea (adult) (pediatric): Secondary | ICD-10-CM | POA: Diagnosis not present

## 2020-09-29 DIAGNOSIS — C3412 Malignant neoplasm of upper lobe, left bronchus or lung: Secondary | ICD-10-CM | POA: Diagnosis not present

## 2020-09-29 DIAGNOSIS — Z87891 Personal history of nicotine dependence: Secondary | ICD-10-CM | POA: Insufficient documentation

## 2020-09-29 DIAGNOSIS — I10 Essential (primary) hypertension: Secondary | ICD-10-CM | POA: Insufficient documentation

## 2020-09-29 DIAGNOSIS — E039 Hypothyroidism, unspecified: Secondary | ICD-10-CM

## 2020-09-29 DIAGNOSIS — C3492 Malignant neoplasm of unspecified part of left bronchus or lung: Secondary | ICD-10-CM

## 2020-09-29 DIAGNOSIS — E785 Hyperlipidemia, unspecified: Secondary | ICD-10-CM | POA: Diagnosis not present

## 2020-09-29 DIAGNOSIS — I480 Paroxysmal atrial fibrillation: Secondary | ICD-10-CM | POA: Insufficient documentation

## 2020-09-29 DIAGNOSIS — D696 Thrombocytopenia, unspecified: Secondary | ICD-10-CM | POA: Insufficient documentation

## 2020-09-29 DIAGNOSIS — C9110 Chronic lymphocytic leukemia of B-cell type not having achieved remission: Secondary | ICD-10-CM | POA: Diagnosis not present

## 2020-09-29 DIAGNOSIS — Z923 Personal history of irradiation: Secondary | ICD-10-CM | POA: Insufficient documentation

## 2020-09-29 DIAGNOSIS — Z5112 Encounter for antineoplastic immunotherapy: Secondary | ICD-10-CM | POA: Diagnosis not present

## 2020-09-29 DIAGNOSIS — Z7901 Long term (current) use of anticoagulants: Secondary | ICD-10-CM | POA: Diagnosis not present

## 2020-09-29 DIAGNOSIS — Z79899 Other long term (current) drug therapy: Secondary | ICD-10-CM | POA: Insufficient documentation

## 2020-09-29 LAB — CMP (CANCER CENTER ONLY)
ALT: 26 U/L (ref 0–44)
AST: 24 U/L (ref 15–41)
Albumin: 3.7 g/dL (ref 3.5–5.0)
Alkaline Phosphatase: 72 U/L (ref 38–126)
Anion gap: 10 (ref 5–15)
BUN: 9 mg/dL (ref 8–23)
CO2: 27 mmol/L (ref 22–32)
Calcium: 9.7 mg/dL (ref 8.9–10.3)
Chloride: 105 mmol/L (ref 98–111)
Creatinine: 0.77 mg/dL (ref 0.61–1.24)
GFR, Estimated: >60 mL/min (ref >60)
Glucose, Bld: 103 mg/dL — ABNORMAL HIGH (ref 70–99)
Potassium: 4 mmol/L (ref 3.5–5.1)
Sodium: 142 mmol/L (ref 135–145)
Total Bilirubin: 1.1 mg/dL (ref 0.3–1.2)
Total Protein: 7.3 g/dL (ref 6.5–8.1)

## 2020-09-29 LAB — CBC WITH DIFFERENTIAL (CANCER CENTER ONLY)
Abs Immature Granulocytes: 0.04 10*3/uL (ref 0.00–0.07)
Basophils Absolute: 0 10*3/uL (ref 0.0–0.1)
Basophils Relative: 0 %
Eosinophils Absolute: 0.3 10*3/uL (ref 0.0–0.5)
Eosinophils Relative: 3 %
HCT: 44.7 % (ref 39.0–52.0)
Hemoglobin: 15 g/dL (ref 13.0–17.0)
Immature Granulocytes: 0 %
Lymphocytes Relative: 22 %
Lymphs Abs: 2.2 10*3/uL (ref 0.7–4.0)
MCH: 30.3 pg (ref 26.0–34.0)
MCHC: 33.6 g/dL (ref 30.0–36.0)
MCV: 90.3 fL (ref 80.0–100.0)
Monocytes Absolute: 1.1 10*3/uL — ABNORMAL HIGH (ref 0.1–1.0)
Monocytes Relative: 10 %
Neutro Abs: 6.5 10*3/uL (ref 1.7–7.7)
Neutrophils Relative %: 65 %
Platelet Count: 175 10*3/uL (ref 150–400)
RBC: 4.95 MIL/uL (ref 4.22–5.81)
RDW: 12.6 % (ref 11.5–15.5)
WBC Count: 10.1 10*3/uL (ref 4.0–10.5)
nRBC: 0 % (ref 0.0–0.2)

## 2020-09-29 LAB — TSH: TSH: 2.876 u[IU]/mL (ref 0.320–4.118)

## 2020-09-29 MED ORDER — SODIUM CHLORIDE 0.9 % IV SOLN
Freq: Once | INTRAVENOUS | Status: AC
Start: 2020-09-29 — End: 2020-09-29
  Filled 2020-09-29: qty 250

## 2020-09-29 MED ORDER — SODIUM CHLORIDE 0.9 % IV SOLN
1500.0000 mg | Freq: Once | INTRAVENOUS | Status: AC
  Administered 2020-09-29: 1500 mg via INTRAVENOUS
  Filled 2020-09-29: qty 30

## 2020-09-29 NOTE — Progress Notes (Signed)
When patient arrived to treatment area, noted many fleas on clothing and skin. Respectfully asked patient if he had a flea problem at home. He replied, "I've had a problem with them (fleas) this year. They (fleas) seem to be really bad." Patient reports he is using "natural remedies" of baking soda and salt to remedy the issue. Informed patient we could ask social work to assist with extermination. The patient vehemently refused. Gwinda Maine and Johnnye Lana aware of situation and advised to contact them if patient decides he needs additional resources.

## 2020-09-29 NOTE — Patient Instructions (Signed)
[facility]COL[facility]   Discharge Instructions: Thank you for choosing [facility] to provide your oncology and hematology care.   If you have a lab appointment with the Cancer Center, please go directly to the Cancer Center and check in at the registration area.   Wear comfortable clothing and clothing appropriate for easy access to any Portacath or PICC line.   We strive to give you quality time with your provider. You may need to reschedule your appointment if you arrive late (15 or more minutes).  Arriving late affects you and other patients whose appointments are after yours.  Also, if you miss three or more appointments without notifying the office, you may be dismissed from the clinic at the provider's discretion.      For prescription refill requests, have your pharmacy contact our office and allow 72 hours for refills to be completed.    Today you received the following chemotherapy and/or immunotherapy agents: durvalumab.      To help prevent nausea and vomiting after your treatment, we encourage you to take your nausea medication as directed.  BELOW ARE SYMPTOMS THAT SHOULD BE REPORTED IMMEDIATELY: *FEVER GREATER THAN 100.4 F (38 C) OR HIGHER *CHILLS OR SWEATING *NAUSEA AND VOMITING THAT IS NOT CONTROLLED WITH YOUR NAUSEA MEDICATION *UNUSUAL SHORTNESS OF BREATH *UNUSUAL BRUISING OR BLEEDING *URINARY PROBLEMS (pain or burning when urinating, or frequent urination) *BOWEL PROBLEMS (unusual diarrhea, constipation, pain near the anus) TENDERNESS IN MOUTH AND THROAT WITH OR WITHOUT PRESENCE OF ULCERS (sore throat, sores in mouth, or a toothache) UNUSUAL RASH, SWELLING OR PAIN  UNUSUAL VAGINAL DISCHARGE OR ITCHING   Items with * indicate a potential emergency and should be followed up as soon as possible or go to the Emergency Department if any problems should occur.  Please show the CHEMOTHERAPY ALERT CARD or IMMUNOTHERAPY ALERT CARD at check-in  to the Emergency Department and triage nurse.  Should you have questions after your visit or need to cancel or reschedule your appointment, please contact [facility]  Dept: [phone]  and follow the prompts.  Office hours are 8:00 a.m. to 4:30 p.m. Monday - Friday. Please note that voicemails left after 4:00 p.m. may not be returned until the following business day.  We are closed weekends and major holidays. You have access to a nurse at all times for urgent questions. Please call the main number to the clinic Dept: [phone] and follow the prompts.   For any non-urgent questions, you may also contact your provider using MyChart. We now offer e-Visits for anyone 18 and older to request care online for non-urgent symptoms. For details visit [email or web address].   Also download the MyChart app! Go to the app store, search "MyChart", open the app, select [facility], and log in with your MyChart username and password.  Due to Covid, a mask is required upon entering the hospital/clinic. If you do not have a mask, one will be given to you upon arrival. For doctor visits, patients may have 1 support person aged 18 or older with them. For treatment visits, patients cannot have anyone with them due to current Covid guidelines and our immunocompromised population.   

## 2020-10-22 ENCOUNTER — Ambulatory Visit: Payer: Medicare Other | Admitting: Emergency Medicine

## 2020-10-24 ENCOUNTER — Encounter (HOSPITAL_COMMUNITY): Payer: Self-pay

## 2020-10-24 ENCOUNTER — Ambulatory Visit (HOSPITAL_COMMUNITY)
Admission: RE | Admit: 2020-10-24 | Discharge: 2020-10-24 | Disposition: A | Discharge status: Home / Self Care | Payer: Medicare Other | Source: Ambulatory Visit | Attending: Physician Assistant | Admitting: Physician Assistant

## 2020-10-24 ENCOUNTER — Other Ambulatory Visit: Payer: Self-pay

## 2020-10-24 DIAGNOSIS — I700 Atherosclerosis of aorta: Secondary | ICD-10-CM | POA: Diagnosis not present

## 2020-10-24 DIAGNOSIS — J439 Emphysema, unspecified: Secondary | ICD-10-CM | POA: Diagnosis not present

## 2020-10-24 DIAGNOSIS — C3492 Malignant neoplasm of unspecified part of left bronchus or lung: Secondary | ICD-10-CM | POA: Insufficient documentation

## 2020-10-24 DIAGNOSIS — R911 Solitary pulmonary nodule: Secondary | ICD-10-CM | POA: Diagnosis not present

## 2020-10-24 DIAGNOSIS — I712 Thoracic aortic aneurysm, without rupture: Secondary | ICD-10-CM | POA: Diagnosis not present

## 2020-10-24 DIAGNOSIS — C3490 Malignant neoplasm of unspecified part of unspecified bronchus or lung: Secondary | ICD-10-CM | POA: Diagnosis not present

## 2020-10-24 MED ORDER — IOHEXOL 350 MG/ML SOLN
60.0000 mL | Freq: Once | INTRAVENOUS | Status: AC | PRN
  Administered 2020-10-24: 60 mL via INTRAVENOUS

## 2020-10-27 ENCOUNTER — Other Ambulatory Visit: Payer: Self-pay

## 2020-10-27 ENCOUNTER — Ambulatory Visit: Payer: Medicare Other

## 2020-10-27 ENCOUNTER — Inpatient Hospital Stay: Payer: Medicare Other

## 2020-10-27 ENCOUNTER — Inpatient Hospital Stay: Payer: Medicare Other | Attending: Hematology | Admitting: Internal Medicine

## 2020-10-27 ENCOUNTER — Other Ambulatory Visit: Payer: Self-pay | Admitting: Internal Medicine

## 2020-10-27 ENCOUNTER — Encounter: Payer: Self-pay | Admitting: Internal Medicine

## 2020-10-27 VITALS — BP 136/76 | HR 76 | Temp 97.8°F | Resp 19 | Ht 71.0 in | Wt 193.2 lb

## 2020-10-27 DIAGNOSIS — Z79899 Other long term (current) drug therapy: Secondary | ICD-10-CM | POA: Insufficient documentation

## 2020-10-27 DIAGNOSIS — C3492 Malignant neoplasm of unspecified part of left bronchus or lung: Secondary | ICD-10-CM

## 2020-10-27 DIAGNOSIS — Z7901 Long term (current) use of anticoagulants: Secondary | ICD-10-CM | POA: Insufficient documentation

## 2020-10-27 DIAGNOSIS — D696 Thrombocytopenia, unspecified: Secondary | ICD-10-CM | POA: Diagnosis not present

## 2020-10-27 DIAGNOSIS — E039 Hypothyroidism, unspecified: Secondary | ICD-10-CM

## 2020-10-27 DIAGNOSIS — G4733 Obstructive sleep apnea (adult) (pediatric): Secondary | ICD-10-CM | POA: Diagnosis not present

## 2020-10-27 DIAGNOSIS — C9110 Chronic lymphocytic leukemia of B-cell type not having achieved remission: Secondary | ICD-10-CM | POA: Diagnosis not present

## 2020-10-27 DIAGNOSIS — E785 Hyperlipidemia, unspecified: Secondary | ICD-10-CM | POA: Diagnosis not present

## 2020-10-27 DIAGNOSIS — C3412 Malignant neoplasm of upper lobe, left bronchus or lung: Secondary | ICD-10-CM | POA: Diagnosis not present

## 2020-10-27 DIAGNOSIS — Z923 Personal history of irradiation: Secondary | ICD-10-CM | POA: Insufficient documentation

## 2020-10-27 DIAGNOSIS — I10 Essential (primary) hypertension: Secondary | ICD-10-CM | POA: Diagnosis not present

## 2020-10-27 DIAGNOSIS — J449 Chronic obstructive pulmonary disease, unspecified: Secondary | ICD-10-CM | POA: Diagnosis not present

## 2020-10-27 DIAGNOSIS — I480 Paroxysmal atrial fibrillation: Secondary | ICD-10-CM | POA: Insufficient documentation

## 2020-10-27 DIAGNOSIS — Z5112 Encounter for antineoplastic immunotherapy: Secondary | ICD-10-CM | POA: Diagnosis not present

## 2020-10-27 DIAGNOSIS — Z87891 Personal history of nicotine dependence: Secondary | ICD-10-CM | POA: Insufficient documentation

## 2020-10-27 LAB — CBC WITH DIFFERENTIAL (CANCER CENTER ONLY)
Abs Immature Granulocytes: 0.05 10*3/uL (ref 0.00–0.07)
Basophils Absolute: 0.1 10*3/uL (ref 0.0–0.1)
Basophils Relative: 1 %
Eosinophils Absolute: 0.2 10*3/uL (ref 0.0–0.5)
Eosinophils Relative: 2 %
HCT: 46.4 % (ref 39.0–52.0)
Hemoglobin: 15.3 g/dL (ref 13.0–17.0)
Immature Granulocytes: 1 %
Lymphocytes Relative: 19 %
Lymphs Abs: 2.1 10*3/uL (ref 0.7–4.0)
MCH: 30.2 pg (ref 26.0–34.0)
MCHC: 33 g/dL (ref 30.0–36.0)
MCV: 91.5 fL (ref 80.0–100.0)
Monocytes Absolute: 1.1 10*3/uL — ABNORMAL HIGH (ref 0.1–1.0)
Monocytes Relative: 10 %
Neutro Abs: 7.3 10*3/uL (ref 1.7–7.7)
Neutrophils Relative %: 67 %
Platelet Count: 203 10*3/uL (ref 150–400)
RBC: 5.07 MIL/uL (ref 4.22–5.81)
RDW: 13.2 % (ref 11.5–15.5)
WBC Count: 10.7 10*3/uL — ABNORMAL HIGH (ref 4.0–10.5)
nRBC: 0 % (ref 0.0–0.2)

## 2020-10-27 LAB — CMP (CANCER CENTER ONLY)
ALT: 29 U/L (ref 0–44)
AST: 25 U/L (ref 15–41)
Albumin: 3.9 g/dL (ref 3.5–5.0)
Alkaline Phosphatase: 81 U/L (ref 38–126)
Anion gap: 10 (ref 5–15)
BUN: 7 mg/dL — ABNORMAL LOW (ref 8–23)
CO2: 26 mmol/L (ref 22–32)
Calcium: 9.8 mg/dL (ref 8.9–10.3)
Chloride: 106 mmol/L (ref 98–111)
Creatinine: 0.84 mg/dL (ref 0.61–1.24)
GFR, Estimated: >60 mL/min (ref >60)
Glucose, Bld: 118 mg/dL — ABNORMAL HIGH (ref 70–99)
Potassium: 4 mmol/L (ref 3.5–5.1)
Sodium: 142 mmol/L (ref 135–145)
Total Bilirubin: 1.2 mg/dL (ref 0.3–1.2)
Total Protein: 7.3 g/dL (ref 6.5–8.1)

## 2020-10-27 LAB — TSH: TSH: 2.29 u[IU]/mL (ref 0.320–4.118)

## 2020-10-27 MED ORDER — SODIUM CHLORIDE 0.9 % IV SOLN
Freq: Once | INTRAVENOUS | Status: AC
  Filled 2020-10-27: qty 250

## 2020-10-27 MED ORDER — SODIUM CHLORIDE 0.9 % IV SOLN
1500.0000 mg | Freq: Once | INTRAVENOUS | Status: AC
  Administered 2020-10-27: 1500 mg via INTRAVENOUS
  Filled 2020-10-27: qty 30

## 2020-10-27 NOTE — Patient Instructions (Signed)
[facility]COL[facility]  Discharge Instructions: Thank you for choosing [facility] to provide your oncology and hematology care.   If you have a lab appointment with the Cancer Center, please go directly to the Cancer Center and check in at the registration area.   Wear comfortable clothing and clothing appropriate for easy access to any Portacath or PICC line.   We strive to give you quality time with your provider. You may need to reschedule your appointment if you arrive late (15 or more minutes).  Arriving late affects you and other patients whose appointments are after yours.  Also, if you miss three or more appointments without notifying the office, you may be dismissed from the clinic at the provider's discretion.      For prescription refill requests, have your pharmacy contact our office and allow 72 hours for refills to be completed.    Today you received the following chemotherapy and/or immunotherapy agents Imfinzi      To help prevent nausea and vomiting after your treatment, we encourage you to take your nausea medication as directed.  BELOW ARE SYMPTOMS THAT SHOULD BE REPORTED IMMEDIATELY: *FEVER GREATER THAN 100.4 F (38 C) OR HIGHER *CHILLS OR SWEATING *NAUSEA AND VOMITING THAT IS NOT CONTROLLED WITH YOUR NAUSEA MEDICATION *UNUSUAL SHORTNESS OF BREATH *UNUSUAL BRUISING OR BLEEDING *URINARY PROBLEMS (pain or burning when urinating, or frequent urination) *BOWEL PROBLEMS (unusual diarrhea, constipation, pain near the anus) TENDERNESS IN MOUTH AND THROAT WITH OR WITHOUT PRESENCE OF ULCERS (sore throat, sores in mouth, or a toothache) UNUSUAL RASH, SWELLING OR PAIN  UNUSUAL VAGINAL DISCHARGE OR ITCHING   Items with * indicate a potential emergency and should be followed up as soon as possible or go to the Emergency Department if any problems should occur.  Please show the CHEMOTHERAPY ALERT CARD or IMMUNOTHERAPY ALERT CARD at check-in to the  Emergency Department and triage nurse.  Should you have questions after your visit or need to cancel or reschedule your appointment, please contact [facility]  Dept: [phone]  and follow the prompts.  Office hours are 8:00 a.m. to 4:30 p.m. Monday - Friday. Please note that voicemails left after 4:00 p.m. may not be returned until the following business day.  We are closed weekends and major holidays. You have access to a nurse at all times for urgent questions. Please call the main number to the clinic Dept: [phone] and follow the prompts.   For any non-urgent questions, you may also contact your provider using MyChart. We now offer e-Visits for anyone 18 and older to request care online for non-urgent symptoms. For details visit [email or web address].   Also download the MyChart app! Go to the app store, search "MyChart", open the app, select [facility], and log in with your MyChart username and password.  Due to Covid, a mask is required upon entering the hospital/clinic. If you do not have a mask, one will be given to you upon arrival. For doctor visits, patients may have 1 support person aged 18 or older with them. For treatment visits, patients cannot have anyone with them due to current Covid guidelines and our immunocompromised population.   

## 2020-10-27 NOTE — Progress Notes (Signed)
[place] Telephone:([phone]) [phone]   [phone]) [phone]  OFFICE PROGRESS NOTE  [name], [name], MD [place] [id number]  DIAGNOSIS: 1) Stage IIIb non-small cell lung cancer, squamous cell carcinoma.  The patient presented with a left upper lobe lung mass as well as associated mediastinal lymphadenopathy and contralateral right hilar mild hypermetabolism.  There was heterogeneous marrow uptake in the spine but with more focal areas at L4 without imaging correlation on CT scan.  The patient was diagnosed in August 2021. 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  First dose expected on 12/13/2019. Status post 5 cycles.   Last dose was giving January 14, 2020.   CURRENT THERAPY:  Consolidation treatment with immunotherapy with Imfinzi 1500 mg IV every 4 weeks.  First dose February 19, 2020.  Status post 9 cycles.  INTERVAL HISTORY: ANA LIAW 75 y.o. male returns to the clinic today for follow-up visit.  The patient is feeling fine today with no concerning complaints he felt much better the last 3 weeks after adjustment of his thyroxine.  He denied having any chest pain, shortness of breath, cough or hemoptysis.  He denied having any fever or chills.  He has no nausea, vomiting, diarrhea or constipation.  He has no headache or visual changes.  He has no significant weight loss or night sweats.  He continues to tolerate his treatment with Imfinzi fairly well.  The patient had repeat CT scan of the chest performed few days ago and he is here for evaluation and discussion of his scan results.   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   chronic stage 1- no symtoms   CLL (chronic lymphocytic leukemia) ([place]) 08/08/2014   May 09, 2014 Dr Burr Medico Stage 0   COPD mixed type ([place]) 07/26/2007   Smoker - stopped 6/14    De Quervain's tenosynovitis, right 2013-12-22   09-May-2013    Depression    at times   Dysrhythmia    PAF   Dysuria 12/22/2013   9/15 - poss stricture Urol ref was offered    Gallstones 11/16/2017   Asymptomatic Pt refused surg ref   Generalized anxiety disorder 09/07/2012   Chronic   Potential benefits of a long term steroid  use as well as potential risks  and complications were explained to the patient and were aknowledged.      GERD 12/02/2006   Chronic      Grief May 23, 2016   [name] died in 05/09/2014   Gynecomastia 12-22-13   Benign B May 09, 2013    Hyperlipidemia    Hypertension    Hypothyroidism 12/25/2014   05-09-14 On Levothyroxine    Intertrigo 02/02/2012   11/13    Neoplasm of uncertain behavior of skin 03/12/2013   12/14 R ear, chest    OSA on CPAP    mild with AHI 9/hr and oxygen desats as low as 75%   Paresis ([place])    right- s/p cerv decompression   PERIORBITAL CELLULITIS 02/22/2009   Qualifier: Diagnosis of  By: Diona Browner MD, Amy     Retinal detachment  L>>R    ALLERGIES:  has No Known Allergies.  MEDICATIONS:  Current Outpatient Medications  Medication Sig Dispense Refill   amLODipine (NORVASC) 5 MG tablet TAKE 1 TABLET BY MOUTH EVERY DAY 90 tablet 2   atenolol (TENORMIN) 25 MG tablet TAKE 1 TABLET BY MOUTH DAILY AS NEEDED FOR PALPITATIONS 90 tablet 3   Cholecalciferol 25 MCG (1000 UT) tablet Take 1,000 Units by mouth daily.     clotrimazole (LOTRIMIN) 1 % cream Apply 1 application topically 2 (two) times daily. 30 g 1   diazepam (VALIUM) 5 MG tablet TAKE 1 TABLET(5 MG) BY MOUTH EVERY 12 HOURS 180 tablet 1   docusate sodium (COLACE) 100 MG capsule Take 100 mg by mouth 2 (two) times daily.     ELIQUIS 5 MG TABS tablet TAKE 1 TABLET(5 MG) BY MOUTH TWICE DAILY 180 tablet 1   levothyroxine (SYNTHROID) 112  MCG tablet Take 1 tablet (112 mcg total) by mouth daily. 30 tablet 2   lovastatin (MEVACOR) 20 MG tablet TAKE 1 TABLET BY MOUTH EVERY NIGHT AT BEDTIME 90 tablet 2   Multiple Vitamin (MULTIVITAMIN) tablet Take 1 tablet by mouth daily. Centrum Silver.     Polyethyl Glycol-Propyl Glycol (SYSTANE) 0.4-0.3 % SOLN Place 1 drop into both eyes daily as needed (Dry eyes). Ultra     prochlorperazine (COMPAZINE) 10 MG tablet Take 1 tablet (10 mg total) by mouth every 6 (six) hours as needed. 30 tablet 2   No current facility-administered medications for this visit.    SURGICAL HISTORY:  Past Surgical History:  Procedure Laterality Date   BICEPS TENDON REPAIR     BRONCHIAL BIOPSY  10/23/2019   Procedure: BRONCHIAL BIOPSIES;  Surgeon: [name], MD;  Location: [facility] ENDOSCOPY;  Service: Pulmonary;;   BRONCHIAL BIOPSY  11/20/2019   Procedure: BRONCHIAL BIOPSIES;  Surgeon: [name], MD;  Location: [facility] ENDOSCOPY;  Service: Pulmonary;;   BRONCHIAL BRUSHINGS  10/23/2019   Procedure: BRONCHIAL BRUSHINGS;  Surgeon: [name], MD;  Location: [facility] ENDOSCOPY;  Service: Pulmonary;;   BRONCHIAL BRUSHINGS  11/20/2019   Procedure: BRONCHIAL BRUSHINGS;  Surgeon: [name], MD;  Location: [facility] ENDOSCOPY;  Service: Pulmonary;;   BRONCHIAL NEEDLE ASPIRATION BIOPSY  10/23/2019   Procedure: BRONCHIAL NEEDLE ASPIRATION BIOPSIES;  Surgeon: [name], MD;  Location: [facility] ENDOSCOPY;  Service: Pulmonary;;   BRONCHIAL NEEDLE ASPIRATION BIOPSY  11/20/2019   Procedure: BRONCHIAL NEEDLE ASPIRATION BIOPSIES;  Surgeon: [name], MD;  Location: [facility] ENDOSCOPY;  Service: Pulmonary;;   BRONCHIAL WASHINGS  11/20/2019   Procedure: BRONCHIAL WASHINGS;  Surgeon: [name], MD;  Location: [facility];  Service: Pulmonary;;   CATARACT EXTRACTION Left    COLONOSCOPY  04-14-99   Dr Flossie Dibble polyp-TA in epic   POLYPECTOMY  04-14-99   POSTERIOR LAMINECTOMY / DECOMPRESSION CERVICAL SPINE     Dr Saintclair Halsted   RETINAL  DETACHMENT SURGERY     left eye, 2007 x2, 2008 x 3   ROTATOR CUFF REPAIR  2004   right   TONSILLECTOMY  [phone]   VIDEO BRONCHOSCOPY WITH ENDOBRONCHIAL NAVIGATION N/A 10/23/2019   Procedure: VIDEO BRONCHOSCOPY WITH ENDOBRONCHIAL NAVIGATION;  Surgeon: [name], MD;  Location: [facility] ENDOSCOPY;  Service: Pulmonary;  Laterality: N/A;   VIDEO BRONCHOSCOPY WITH ENDOBRONCHIAL NAVIGATION N/A 11/20/2019   Procedure: VIDEO BRONCHOSCOPY WITH ENDOBRONCHIAL NAVIGATION;  Surgeon: [name], MD;  Location: [facility] ENDOSCOPY;  Service: Pulmonary;  Laterality: N/A;   VIDEO BRONCHOSCOPY WITH ENDOBRONCHIAL ULTRASOUND N/A 10/23/2019  Procedure: VIDEO BRONCHOSCOPY WITH ENDOBRONCHIAL ULTRASOUND;  Surgeon: [name], MD;  Location: [facility] ENDOSCOPY;  Service: Pulmonary;  Laterality: N/A;    REVIEW OF SYSTEMS:  Constitutional: positive for fatigue Eyes: negative Ears, nose, mouth, throat, and face: negative Respiratory: negative Cardiovascular: negative Gastrointestinal: negative Genitourinary:negative Integument/breast: negative Hematologic/lymphatic: negative Musculoskeletal:negative Neurological: negative Behavioral/Psych: negative Endocrine: negative Allergic/Immunologic: negative   PHYSICAL EXAMINATION: General appearance: alert, cooperative, and no distress Head: Normocephalic, without obvious abnormality, atraumatic Neck: no adenopathy, no JVD, supple, symmetrical, trachea midline, and thyroid not enlarged, symmetric, no tenderness/mass/nodules Lymph nodes: Cervical, supraclavicular, and axillary nodes normal. Resp: clear to auscultation bilaterally Back: symmetric, no curvature. ROM normal. No CVA tenderness. Cardio: regular rate and rhythm, S1, S2 normal, no murmur, click, rub or gallop GI: soft, non-tender; bowel sounds normal; no masses,  no organomegaly Extremities: extremities normal, atraumatic, no cyanosis or edema Neurologic: Alert and oriented X 3, normal strength and tone. Normal  symmetric reflexes. Normal coordination and gait  ECOG PERFORMANCE STATUS: 1 - Symptomatic but completely ambulatory  Blood pressure 136/76, pulse 76, temperature 97.8 F (36.6 C), temperature source Tympanic, resp. rate 19, height _0  (1.803 m), weight 193 lb 3.2 oz (87.6 kg), SpO2 95 %.  LABORATORY DATA: Lab Results  Component Value Date   WBC 10.1 09/29/2020   HGB 15.0 09/29/2020   HCT 44.7 09/29/2020   MCV 90.3 09/29/2020   PLT 175 09/29/2020      Chemistry      Component Value Date/Time   NA 142 09/29/2020 0846   NA 143 03/10/2017 0911   K 4.0 09/29/2020 0846   K 4.7 03/10/2017 0911   CL 105 09/29/2020 0846   CO2 27 09/29/2020 0846   CO2 30 (H) 03/10/2017 0911   BUN 9 09/29/2020 0846   BUN 10.7 03/10/2017 0911   CREATININE 0.77 09/29/2020 0846   CREATININE 0.8 03/10/2017 0911      Component Value Date/Time   CALCIUM 9.7 09/29/2020 0846   CALCIUM 9.4 03/10/2017 0911   ALKPHOS 72 09/29/2020 0846   ALKPHOS 76 03/10/2017 0911   AST 24 09/29/2020 0846   AST 34 03/10/2017 0911   ALT 26 09/29/2020 0846   ALT 45 03/10/2017 0911   BILITOT 1.1 09/29/2020 0846   BILITOT 1.41 (H) 03/10/2017 0911       RADIOGRAPHIC STUDIES: No results found.   ASSESSMENT AND PLAN: This is a very pleasant 75 years old white male with a stage IIIb non-small cell lung cancer, squamous cell carcinoma diagnosed in August of 2021.  The patient also has a history of stage 0 CLL.  He underwent a course of concurrent chemoradiation with weekly carboplatin for AUC of 2 and paclitaxel 45 mg/M2 status post 5 cycles of the treatment.  He tolerated this treatment well with no concerning adverse effect except for mild odynophagia and dysphagia as well as thrombocytopenia. His scan showed improvement of his disease with decrease in the size of the left upper lobe lung mass as well as decrease in the mediastinal lymphadenopathy and no evidence of metastatic disease. The patient is currently undergoing  consolidation treatment with immunotherapy with Imfinzi 1500 mg IV every 4 weeks status post 9 cycles. The patient has been tolerating this treatment well with no concerning adverse effect except for mild fatigue. He had repeat CT scan of the chest performed few days ago.  The final report is still pending but I personally reviewed the scan images and compared it to the previous one few  months ago.  I did not see any clear evidence for progression but we definitely will wait for the final report for confirmation. I recommended for the patient to continue his current treatment with Imfinzi and he will proceed with cycle #10 today. I will see him back for follow-up visit in 4 weeks for evaluation before the next cycle of his treatment. For the hypothyroidism, he will continue with levothyroxine 100 mcg p.o. daily and will adjust his dose accordingly depending on the TSH. The patient was advised to call immediately if he has any concerning symptoms in the interval. The patient voices understanding of current disease status and treatment options and is in agreement with the current care plan.  All questions were answered. The patient knows to call the clinic with any problems, questions or concerns. We can certainly see the patient much sooner if necessary.  Disclaimer: This note was dictated with voice recognition software. Similar sounding words can inadvertently be transcribed and may not be corrected upon review.

## 2020-11-25 ENCOUNTER — Other Ambulatory Visit: Payer: Self-pay

## 2020-11-25 ENCOUNTER — Inpatient Hospital Stay: Payer: Medicare Other | Attending: Hematology | Admitting: Internal Medicine

## 2020-11-25 ENCOUNTER — Encounter: Payer: Self-pay | Admitting: Internal Medicine

## 2020-11-25 ENCOUNTER — Telehealth: Payer: Self-pay | Admitting: Internal Medicine

## 2020-11-25 ENCOUNTER — Inpatient Hospital Stay: Payer: Medicare Other

## 2020-11-25 VITALS — BP 122/69 | HR 65 | Temp 96.6°F | Resp 18 | Wt 191.1 lb

## 2020-11-25 DIAGNOSIS — Z5112 Encounter for antineoplastic immunotherapy: Secondary | ICD-10-CM | POA: Diagnosis not present

## 2020-11-25 DIAGNOSIS — I10 Essential (primary) hypertension: Secondary | ICD-10-CM

## 2020-11-25 DIAGNOSIS — Z79899 Other long term (current) drug therapy: Secondary | ICD-10-CM | POA: Insufficient documentation

## 2020-11-25 DIAGNOSIS — C3412 Malignant neoplasm of upper lobe, left bronchus or lung: Secondary | ICD-10-CM | POA: Insufficient documentation

## 2020-11-25 DIAGNOSIS — C9110 Chronic lymphocytic leukemia of B-cell type not having achieved remission: Secondary | ICD-10-CM

## 2020-11-25 DIAGNOSIS — C3492 Malignant neoplasm of unspecified part of left bronchus or lung: Secondary | ICD-10-CM

## 2020-11-25 DIAGNOSIS — E039 Hypothyroidism, unspecified: Secondary | ICD-10-CM

## 2020-11-25 LAB — CBC WITH DIFFERENTIAL (CANCER CENTER ONLY)
Abs Immature Granulocytes: 0.03 K/uL (ref 0.00–0.07)
Basophils Absolute: 0.1 K/uL (ref 0.0–0.1)
Basophils Relative: 1 %
Eosinophils Absolute: 0.1 K/uL (ref 0.0–0.5)
Eosinophils Relative: 1 %
HCT: 45.4 % (ref 39.0–52.0)
Hemoglobin: 15.1 g/dL (ref 13.0–17.0)
Immature Granulocytes: 0 %
Lymphocytes Relative: 19 %
Lymphs Abs: 2.1 K/uL (ref 0.7–4.0)
MCH: 29.8 pg (ref 26.0–34.0)
MCHC: 33.3 g/dL (ref 30.0–36.0)
MCV: 89.7 fL (ref 80.0–100.0)
Monocytes Absolute: 1.1 K/uL — ABNORMAL HIGH (ref 0.1–1.0)
Monocytes Relative: 10 %
Neutro Abs: 7.7 K/uL (ref 1.7–7.7)
Neutrophils Relative %: 69 %
Platelet Count: 218 K/uL (ref 150–400)
RBC: 5.06 MIL/uL (ref 4.22–5.81)
RDW: 13.2 % (ref 11.5–15.5)
WBC Count: 11.1 K/uL — ABNORMAL HIGH (ref 4.0–10.5)
nRBC: 0 % (ref 0.0–0.2)

## 2020-11-25 LAB — CMP (CANCER CENTER ONLY)
ALT: 35 U/L (ref 0–44)
AST: 29 U/L (ref 15–41)
Albumin: 4 g/dL (ref 3.5–5.0)
Alkaline Phosphatase: 76 U/L (ref 38–126)
Anion gap: 11 (ref 5–15)
BUN: 13 mg/dL (ref 8–23)
CO2: 25 mmol/L (ref 22–32)
Calcium: 10.2 mg/dL (ref 8.9–10.3)
Chloride: 105 mmol/L (ref 98–111)
Creatinine: 0.84 mg/dL (ref 0.61–1.24)
GFR, Estimated: >60 mL/min (ref >60)
Glucose, Bld: 109 mg/dL — ABNORMAL HIGH (ref 70–99)
Potassium: 4.1 mmol/L (ref 3.5–5.1)
Sodium: 141 mmol/L (ref 135–145)
Total Bilirubin: 1.5 mg/dL — ABNORMAL HIGH (ref 0.3–1.2)
Total Protein: 7.5 g/dL (ref 6.5–8.1)

## 2020-11-25 LAB — TSH: TSH: 3.531 u[IU]/mL (ref 0.320–4.118)

## 2020-11-25 MED ORDER — SODIUM CHLORIDE 0.9 % IV SOLN
1500.0000 mg | Freq: Once | INTRAVENOUS | Status: AC
  Administered 2020-11-25: 1500 mg via INTRAVENOUS
  Filled 2020-11-25: qty 30

## 2020-11-25 MED ORDER — SODIUM CHLORIDE 0.9 % IV SOLN: Freq: Once | INTRAVENOUS | Status: AC

## 2020-11-25 NOTE — Patient Instructions (Signed)
[facility]  Discharge Instructions: Thank you for choosing [facility] to provide your oncology and hematology care.   If you have a lab appointment with the [facility], please go directly to the [facility] and check in at the registration area.   Wear comfortable clothing and clothing appropriate for easy access to any Portacath or PICC line.   We strive to give you quality time with your provider. You may need to reschedule your appointment if you arrive late (15 or more minutes).  Arriving late affects you and other patients whose appointments are after yours.  Also, if you miss three or more appointments without notifying the office, you may be dismissed from the clinic at the provider's discretion.      For prescription refill requests, have your pharmacy contact our office and allow 72 hours for refills to be completed.    Today you received the following chemotherapy and/or immunotherapy agent: Durvalumab (Imfinzi).   To help prevent nausea and vomiting after your treatment, we encourage you to take your nausea medication as directed.  BELOW ARE SYMPTOMS THAT SHOULD BE REPORTED IMMEDIATELY: *FEVER GREATER THAN 100.4 F (38 C) OR HIGHER *CHILLS OR SWEATING *NAUSEA AND VOMITING THAT IS NOT CONTROLLED WITH YOUR NAUSEA MEDICATION *UNUSUAL SHORTNESS OF BREATH *UNUSUAL BRUISING OR BLEEDING *URINARY PROBLEMS (pain or burning when urinating, or frequent urination) *BOWEL PROBLEMS (unusual diarrhea, constipation, pain near the anus) TENDERNESS IN MOUTH AND THROAT WITH OR WITHOUT PRESENCE OF ULCERS (sore throat, sores in mouth, or a toothache) UNUSUAL RASH, SWELLING OR PAIN  UNUSUAL VAGINAL DISCHARGE OR ITCHING   Items with * indicate a potential emergency and should be followed up as soon as possible or go to the Emergency Department if any problems should occur.  Please show the CHEMOTHERAPY ALERT CARD or IMMUNOTHERAPY ALERT CARD at  check-in to the Emergency Department and triage nurse.  Should you have questions after your visit or need to cancel or reschedule your appointment, please contact [facility]  Dept: [phone]  and follow the prompts.  Office hours are 8:00 a.m. to 4:30 p.m. Monday - Friday. Please note that voicemails left after 4:00 p.m. may not be returned until the following business day.  We are closed weekends and major holidays. You have access to a nurse at all times for urgent questions. Please call the main number to the clinic Dept: [phone] and follow the prompts.   For any non-urgent questions, you may also contact your provider using MyChart. We now offer e-Visits for anyone 75 and older to request care online for non-urgent symptoms. For details visit [email or web address].   Also download the MyChart app! Go to the app store, search "MyChart", open the app, select [facility], and log in with your MyChart username and password.  Due to Covid, a mask is required upon entering the hospital/clinic. If you do not have a mask, one will be given to you upon arrival. For doctor visits, patients may have 1 support person aged 75 or older with them. For treatment visits, patients cannot have anyone with them due to current Covid guidelines and our immunocompromised population.

## 2020-11-25 NOTE — Progress Notes (Signed)
[place] [phone]   [phone]  OFFICE PROGRESS NOTE  [name], [name], MD [place] [address]  DIAGNOSIS: 1) Stage IIIb non-small cell lung cancer, squamous cell carcinoma.  The patient presented with a left upper lobe lung mass as well as associated mediastinal lymphadenopathy and contralateral right hilar mild hypermetabolism.  There was heterogeneous marrow uptake in the spine but with more focal areas at L4 without imaging correlation on CT scan.  The patient was diagnosed in August 2021. 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  First dose expected on 12/13/2019. Status post 5 cycles.   Last dose was giving January 14, 2020.   CURRENT THERAPY:  Consolidation treatment with immunotherapy with Imfinzi 1500 mg IV every 4 weeks.  First dose February 19, 2020.  Status post 10 cycles.  INTERVAL HISTORY: Joe [name] 75 y.o. male returns to the clinic today for follow-up visit.  The patient is feeling fine today with no concerning complaints except for few episodes of vertigo over the weekend which currently improved.  He denied having any current chest pain, shortness of breath, cough or hemoptysis.  He denied having any fever or chills.  He has no nausea, vomiting, diarrhea or constipation.  He has no headache or visual changes.  He denied having any recent weight loss or night sweats.  He continues to tolerate his treatment with maintenance Imfinzi fairly well.  The patient had repeat CT scan of the chest on 10/27/2020 and he is here for evaluation and discussion of his scan results and treatment options.   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   chronic stage 1- no symtoms   CLL (chronic lymphocytic leukemia) ([place]) 08/08/2014   2014/05/12 Dr Burr Medico Stage 0   COPD mixed type ([place]) 07/26/2007   Smoker - stopped 6/14    De Quervain's tenosynovitis, right 12-19-2013   May 12, 2013    Depression    at times   Dysrhythmia    PAF   Dysuria 19-Dec-2013   9/15 - poss stricture Urol ref was offered    Gallstones 11/16/2017   Asymptomatic Pt refused surg ref   Generalized anxiety disorder 09/07/2012   Chronic   Potential benefits of a long term steroid  use as well as potential risks  and complications were explained to the patient and were aknowledged.      GERD 12/02/2006   Chronic      Grief May 20, 2016   [name] died in 12-May-2014   Gynecomastia 12-19-13   Benign B 2013-05-12    Hyperlipidemia    Hypertension    Hypothyroidism 12/25/2014   05-12-14 On Levothyroxine    Intertrigo 02/02/2012   11/13    Neoplasm of uncertain behavior of skin 03/12/2013   12/14 R ear, chest    OSA on CPAP    mild with AHI 9/hr and oxygen desats as low as 75%   Paresis ([place])    right- s/p cerv decompression   PERIORBITAL CELLULITIS 02/22/2009   Qualifier: Diagnosis of  By: Diona Browner MD, Amy     Retinal  detachment    L>>R    ALLERGIES:  has No Known Allergies.  MEDICATIONS:  Current Outpatient Medications  Medication Sig Dispense Refill   amLODipine (NORVASC) 5 MG tablet TAKE 1 TABLET BY MOUTH EVERY DAY 90 tablet 2   Cholecalciferol 25 MCG (1000 UT) tablet Take 1,000 Units by mouth daily.     docusate sodium (COLACE) 100 MG capsule Take 100 mg by mouth 2 (two) times daily.     ELIQUIS 5 MG TABS tablet TAKE 1 TABLET(5 MG) BY MOUTH TWICE DAILY 180 tablet 1   levothyroxine (SYNTHROID) 112 MCG tablet Take 1 tablet (112 mcg total) by mouth daily. 30 tablet 2   lovastatin (MEVACOR) 20 MG tablet TAKE 1 TABLET BY MOUTH EVERY NIGHT AT BEDTIME 90 tablet 2   Multiple Vitamin (MULTIVITAMIN) tablet Take 1 tablet by mouth daily.  Centrum Silver.     Polyethyl Glycol-Propyl Glycol (SYSTANE) 0.4-0.3 % SOLN Place 1 drop into both eyes daily as needed (Dry eyes). Ultra     atenolol (TENORMIN) 25 MG tablet TAKE 1 TABLET BY MOUTH DAILY AS NEEDED FOR PALPITATIONS (Patient not taking: No sig reported) 90 tablet 3   diazepam (VALIUM) 5 MG tablet TAKE 1 TABLET(5 MG) BY MOUTH EVERY 12 HOURS (Patient not taking: No sig reported) 180 tablet 1   prochlorperazine (COMPAZINE) 10 MG tablet Take 1 tablet (10 mg total) by mouth every 6 (six) hours as needed. (Patient not taking: No sig reported) 30 tablet 2   No current facility-administered medications for this visit.    SURGICAL HISTORY:  Past Surgical History:  Procedure Laterality Date   BICEPS TENDON REPAIR     BRONCHIAL BIOPSY  10/23/2019   Procedure: BRONCHIAL BIOPSIES;  Surgeon: [name], MD;  Location: [facility] ENDOSCOPY;  Service: Pulmonary;;   BRONCHIAL BIOPSY  11/20/2019   Procedure: BRONCHIAL BIOPSIES;  Surgeon: [name], MD;  Location: [facility] ENDOSCOPY;  Service: Pulmonary;;   BRONCHIAL BRUSHINGS  10/23/2019   Procedure: BRONCHIAL BRUSHINGS;  Surgeon: [name], MD;  Location: [facility] ENDOSCOPY;  Service: Pulmonary;;   BRONCHIAL BRUSHINGS  11/20/2019   Procedure: BRONCHIAL BRUSHINGS;  Surgeon: [name], MD;  Location: [facility] ENDOSCOPY;  Service: Pulmonary;;   BRONCHIAL NEEDLE ASPIRATION BIOPSY  10/23/2019   Procedure: BRONCHIAL NEEDLE ASPIRATION BIOPSIES;  Surgeon: [name], MD;  Location: [facility] ENDOSCOPY;  Service: Pulmonary;;   BRONCHIAL NEEDLE ASPIRATION BIOPSY  11/20/2019   Procedure: BRONCHIAL NEEDLE ASPIRATION BIOPSIES;  Surgeon: [name], MD;  Location: [facility] ENDOSCOPY;  Service: Pulmonary;;   BRONCHIAL WASHINGS  11/20/2019   Procedure: BRONCHIAL WASHINGS;  Surgeon: [name], MD;  Location: [place];  Service: Pulmonary;;   CATARACT EXTRACTION Left    COLONOSCOPY  04-14-99   Dr Flossie Dibble polyp-TA in epic   POLYPECTOMY  04-14-99   POSTERIOR  LAMINECTOMY / DECOMPRESSION CERVICAL SPINE     Dr Saintclair Halsted   RETINAL DETACHMENT SURGERY     left eye, 2007 x2, 2008 x 3   ROTATOR CUFF REPAIR  2004   right   TONSILLECTOMY  0712,1975   VIDEO BRONCHOSCOPY WITH ENDOBRONCHIAL NAVIGATION N/A 10/23/2019   Procedure: VIDEO BRONCHOSCOPY WITH ENDOBRONCHIAL NAVIGATION;  Surgeon: [name], MD;  Location: [facility] ENDOSCOPY;  Service: Pulmonary;  Laterality: N/A;   VIDEO BRONCHOSCOPY WITH ENDOBRONCHIAL NAVIGATION N/A 11/20/2019   Procedure: VIDEO BRONCHOSCOPY WITH ENDOBRONCHIAL NAVIGATION;  Surgeon: [name], MD;  Location: [facility] ENDOSCOPY;  Service: Pulmonary;  Laterality: N/A;   VIDEO BRONCHOSCOPY WITH ENDOBRONCHIAL  ULTRASOUND N/A 10/23/2019   Procedure: VIDEO BRONCHOSCOPY WITH ENDOBRONCHIAL ULTRASOUND;  Surgeon: [name], MD;  Location: [facility] ENDOSCOPY;  Service: Pulmonary;  Laterality: N/A;    REVIEW OF SYSTEMS:  Constitutional: negative Eyes: negative Ears, nose, mouth, throat, and face: negative Respiratory: negative Cardiovascular: negative Gastrointestinal: negative Genitourinary:negative Integument/breast: negative Hematologic/lymphatic: negative Musculoskeletal:negative Neurological: positive for vertigo Behavioral/Psych: negative Endocrine: negative Allergic/Immunologic: negative   PHYSICAL EXAMINATION: General appearance: alert, cooperative, and no distress Head: Normocephalic, without obvious abnormality, atraumatic Neck: no adenopathy, no JVD, supple, symmetrical, trachea midline, and thyroid not enlarged, symmetric, no tenderness/mass/nodules Lymph nodes: Cervical, supraclavicular, and axillary nodes normal. Resp: clear to auscultation bilaterally Back: symmetric, no curvature. ROM normal. No CVA tenderness. Cardio: regular rate and rhythm, S1, S2 normal, no murmur, click, rub or gallop GI: soft, non-tender; bowel sounds normal; no masses,  no organomegaly Extremities: extremities normal, atraumatic, no cyanosis or  edema Neurologic: Alert and oriented X 3, normal strength and tone. Normal symmetric reflexes. Normal coordination and gait  ECOG PERFORMANCE STATUS: 1 - Symptomatic but completely ambulatory  Blood pressure 122/69, pulse 65, temperature (!) 96.6 F (35.9 C), temperature source Tympanic, resp. rate 18, weight 191 lb 0.8 oz (86.7 kg), SpO2 95 %.  LABORATORY DATA: Lab Results  Component Value Date   WBC 11.1 (H) 11/25/2020   HGB 15.1 11/25/2020   HCT 45.4 11/25/2020   MCV 89.7 11/25/2020   PLT 218 11/25/2020      Chemistry      Component Value Date/Time   NA 142 10/27/2020 0900   NA 143 03/10/2017 0911   K 4.0 10/27/2020 0900   K 4.7 03/10/2017 0911   CL 106 10/27/2020 0900   CO2 26 10/27/2020 0900   CO2 30 (H) 03/10/2017 0911   BUN 7 (L) 10/27/2020 0900   BUN 10.7 03/10/2017 0911   CREATININE 0.84 10/27/2020 0900   CREATININE 0.8 03/10/2017 0911      Component Value Date/Time   CALCIUM 9.8 10/27/2020 0900   CALCIUM 9.4 03/10/2017 0911   ALKPHOS 81 10/27/2020 0900   ALKPHOS 76 03/10/2017 0911   AST 25 10/27/2020 0900   AST 34 03/10/2017 0911   ALT 29 10/27/2020 0900   ALT 45 03/10/2017 0911   BILITOT 1.2 10/27/2020 0900   BILITOT 1.41 (H) 03/10/2017 0911       RADIOGRAPHIC STUDIES: No results found.   ASSESSMENT AND PLAN: This is a very pleasant 75 years old white male with a stage IIIb non-small cell lung cancer, squamous cell carcinoma diagnosed in August of 2021.  The patient also has a history of stage 0 CLL.  He underwent a course of concurrent chemoradiation with weekly carboplatin for AUC of 2 and paclitaxel 45 mg/M2 status post 5 cycles of the treatment.  He tolerated this treatment well with no concerning adverse effect except for mild odynophagia and dysphagia as well as thrombocytopenia. His scan showed improvement of his disease with decrease in the size of the left upper lobe lung mass as well as decrease in the mediastinal lymphadenopathy and no  evidence of metastatic disease. The patient is currently undergoing consolidation treatment with immunotherapy with Imfinzi 1500 mg IV every 4 weeks status post 10 cycles. The patient continues to tolerate his treatment with Imfinzi fairly well. He had repeat CT scan of the chest performed on 10/27/2020.  I personally and independently reviewed the scans and discussed the results with the patient today. His scan showed no concerning findings for disease progression. I  recommended for him to continue his current maintenance treatment with Imfinzi and he will proceed with cycle #11 today. For the hypothyroidism, he will continue levothyroxine 112 mcg p.o. daily and will adjust his dose according to the TSH. The patient will come back for follow-up visit in 4 weeks for evaluation before the next cycle of his treatment. His leukocytosis secondary to history of CLL and we will continue to monitor it closely. He was advised to call immediately if he has any other concerning symptoms in the interval. The total time spent in the appointment was 30 minutes.  The patient voices understanding of current disease status and treatment options and is in agreement with the current care plan.  All questions were answered. The patient knows to call the clinic with any problems, questions or concerns. We can certainly see the patient much sooner if necessary.  Disclaimer: This note was dictated with voice recognition software. Similar sounding words can inadvertently be transcribed and may not be corrected upon review.

## 2020-11-25 NOTE — Telephone Encounter (Signed)
Left message for patient to call me back at [phone] to schedule Medicare Annual Wellness Visit   Last AWV  01/04/19  Please schedule at anytime with [facility] if patient calls the office back.    40 Minutes appointment   Any questions, please call me at [phone]

## 2020-11-27 ENCOUNTER — Other Ambulatory Visit: Payer: Self-pay | Admitting: Physician Assistant

## 2020-11-27 DIAGNOSIS — E039 Hypothyroidism, unspecified: Secondary | ICD-10-CM

## 2020-12-11 ENCOUNTER — Other Ambulatory Visit: Payer: Self-pay | Admitting: Cardiology

## 2020-12-11 NOTE — Telephone Encounter (Signed)
Prescription refill request for Eliquis received. Indication:Afib  Last office visit: 05/26/20 [place] Pax) Scr: 0.84 (11/25/20) Age: 75 Weight: 96.7kg  Appropriate dose and refill sent to requested pharmacy.

## 2020-12-14 NOTE — Assessment & Plan Note (Signed)
Continue to follow with Drs Julien Nordmann and Sondra Come. Repeat imaging as per their plans.

## 2020-12-14 NOTE — Assessment & Plan Note (Signed)
COVID and flu shots up to date We will not start any inhaled medications at this time.  Follow with Dr Lamonte Sakai in 6 months or sooner if you have any problems

## 2020-12-22 ENCOUNTER — Inpatient Hospital Stay: Payer: Medicare Other

## 2020-12-22 ENCOUNTER — Inpatient Hospital Stay: Payer: Medicare Other | Attending: Hematology | Admitting: Internal Medicine

## 2020-12-22 ENCOUNTER — Encounter: Payer: Self-pay | Admitting: Internal Medicine

## 2020-12-22 ENCOUNTER — Other Ambulatory Visit: Payer: Self-pay

## 2020-12-22 VITALS — BP 126/78 | HR 77 | Temp 97.2°F | Resp 20 | Ht 71.0 in | Wt 193.1 lb

## 2020-12-22 DIAGNOSIS — Z79899 Other long term (current) drug therapy: Secondary | ICD-10-CM | POA: Diagnosis not present

## 2020-12-22 DIAGNOSIS — C3492 Malignant neoplasm of unspecified part of left bronchus or lung: Secondary | ICD-10-CM

## 2020-12-22 DIAGNOSIS — C9110 Chronic lymphocytic leukemia of B-cell type not having achieved remission: Secondary | ICD-10-CM

## 2020-12-22 DIAGNOSIS — C3412 Malignant neoplasm of upper lobe, left bronchus or lung: Secondary | ICD-10-CM | POA: Diagnosis not present

## 2020-12-22 DIAGNOSIS — E039 Hypothyroidism, unspecified: Secondary | ICD-10-CM

## 2020-12-22 DIAGNOSIS — Z5112 Encounter for antineoplastic immunotherapy: Secondary | ICD-10-CM | POA: Diagnosis not present

## 2020-12-22 LAB — CMP (CANCER CENTER ONLY)
ALT: 26 U/L (ref 0–44)
AST: 22 U/L (ref 15–41)
Albumin: 3.9 g/dL (ref 3.5–5.0)
Alkaline Phosphatase: 84 U/L (ref 38–126)
Anion gap: 11 (ref 5–15)
BUN: 11 mg/dL (ref 8–23)
CO2: 25 mmol/L (ref 22–32)
Calcium: 9.9 mg/dL (ref 8.9–10.3)
Chloride: 105 mmol/L (ref 98–111)
Creatinine: 0.82 mg/dL (ref 0.61–1.24)
GFR, Estimated: >60 mL/min (ref >60)
Glucose, Bld: 109 mg/dL — ABNORMAL HIGH (ref 70–99)
Potassium: 4.1 mmol/L (ref 3.5–5.1)
Sodium: 141 mmol/L (ref 135–145)
Total Bilirubin: 1.1 mg/dL (ref 0.3–1.2)
Total Protein: 7.4 g/dL (ref 6.5–8.1)

## 2020-12-22 LAB — CBC WITH DIFFERENTIAL (CANCER CENTER ONLY)
Abs Immature Granulocytes: 0.05 10*3/uL (ref 0.00–0.07)
Basophils Absolute: 0.1 10*3/uL (ref 0.0–0.1)
Basophils Relative: 0 %
Eosinophils Absolute: 0.2 10*3/uL (ref 0.0–0.5)
Eosinophils Relative: 2 %
HCT: 45.2 % (ref 39.0–52.0)
Hemoglobin: 15.1 g/dL (ref 13.0–17.0)
Immature Granulocytes: 0 %
Lymphocytes Relative: 20 %
Lymphs Abs: 2.3 10*3/uL (ref 0.7–4.0)
MCH: 29.9 pg (ref 26.0–34.0)
MCHC: 33.4 g/dL (ref 30.0–36.0)
MCV: 89.5 fL (ref 80.0–100.0)
Monocytes Absolute: 1.1 10*3/uL — ABNORMAL HIGH (ref 0.1–1.0)
Monocytes Relative: 10 %
Neutro Abs: 7.8 10*3/uL — ABNORMAL HIGH (ref 1.7–7.7)
Neutrophils Relative %: 68 %
Platelet Count: 224 10*3/uL (ref 150–400)
RBC: 5.05 MIL/uL (ref 4.22–5.81)
RDW: 13.2 % (ref 11.5–15.5)
WBC Count: 11.5 10*3/uL — ABNORMAL HIGH (ref 4.0–10.5)
nRBC: 0 % (ref 0.0–0.2)

## 2020-12-22 LAB — TSH: TSH: 1.612 u[IU]/mL (ref 0.320–4.118)

## 2020-12-22 MED ORDER — SODIUM CHLORIDE 0.9 % IV SOLN: Freq: Once | INTRAVENOUS | Status: AC

## 2020-12-22 MED ORDER — SODIUM CHLORIDE 0.9 % IV SOLN
1500.0000 mg | Freq: Once | INTRAVENOUS | Status: AC
  Administered 2020-12-22: 1500 mg via INTRAVENOUS
  Filled 2020-12-22: qty 30

## 2020-12-22 NOTE — Patient Instructions (Signed)
[facility]  Discharge Instructions: Thank you for choosing [facility] to provide your oncology and hematology care.   If you have a lab appointment with the Cancer Center, please go directly to the Cancer Center and check in at the registration area.   Wear comfortable clothing and clothing appropriate for easy access to any Portacath or PICC line.   We strive to give you quality time with your provider. You may need to reschedule your appointment if you arrive late (15 or more minutes).  Arriving late affects you and other patients whose appointments are after yours.  Also, if you miss three or more appointments without notifying the office, you may be dismissed from the clinic at the provider's discretion.      For prescription refill requests, have your pharmacy contact our office and allow 72 hours for refills to be completed.    Today you received the following chemotherapy and/or immunotherapy agents Imfinzi      To help prevent nausea and vomiting after your treatment, we encourage you to take your nausea medication as directed.  BELOW ARE SYMPTOMS THAT SHOULD BE REPORTED IMMEDIATELY: *FEVER GREATER THAN 100.4 F (38 C) OR HIGHER *CHILLS OR SWEATING *NAUSEA AND VOMITING THAT IS NOT CONTROLLED WITH YOUR NAUSEA MEDICATION *UNUSUAL SHORTNESS OF BREATH *UNUSUAL BRUISING OR BLEEDING *URINARY PROBLEMS (pain or burning when urinating, or frequent urination) *BOWEL PROBLEMS (unusual diarrhea, constipation, pain near the anus) TENDERNESS IN MOUTH AND THROAT WITH OR WITHOUT PRESENCE OF ULCERS (sore throat, sores in mouth, or a toothache) UNUSUAL RASH, SWELLING OR PAIN  UNUSUAL VAGINAL DISCHARGE OR ITCHING   Items with * indicate a potential emergency and should be followed up as soon as possible or go to the Emergency Department if any problems should occur.  Please show the CHEMOTHERAPY ALERT CARD or IMMUNOTHERAPY ALERT CARD at check-in to the  Emergency Department and triage nurse.  Should you have questions after your visit or need to cancel or reschedule your appointment, please contact [facility]  Dept: [phone]  and follow the prompts.  Office hours are 8:00 a.m. to 4:30 p.m. Monday - Friday. Please note that voicemails left after 4:00 p.m. may not be returned until the following business day.  We are closed weekends and major holidays. You have access to a nurse at all times for urgent questions. Please call the main number to the clinic Dept: [phone] and follow the prompts.   For any non-urgent questions, you may also contact your provider using MyChart. We now offer e-Visits for anyone 18 and older to request care online for non-urgent symptoms. For details visit [email or web address].   Also download the MyChart app! Go to the app store, search "MyChart", open the app, select [facility], and log in with your MyChart username and password.  Due to Covid, a mask is required upon entering the hospital/clinic. If you do not have a mask, one will be given to you upon arrival. For doctor visits, patients may have 1 support person aged 18 or older with them. For treatment visits, patients cannot have anyone with them due to current Covid guidelines and our immunocompromised population.   

## 2020-12-22 NOTE — Progress Notes (Signed)
[facility] [phone]   [phone]  OFFICE PROGRESS NOTE  [name], [name], MD [place] [id number]  DIAGNOSIS: 1) Stage IIIb non-small cell lung cancer, squamous cell carcinoma.  The patient presented with a left upper lobe lung mass as well as associated mediastinal lymphadenopathy and contralateral right hilar mild hypermetabolism.  There was heterogeneous marrow uptake in the spine but with more focal areas at L4 without imaging correlation on CT scan.  The patient was diagnosed in August 2021. 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  First dose expected on 12/13/2019. Status post 5 cycles.   Last dose was giving January 14, 2020.   CURRENT THERAPY:  Consolidation treatment with immunotherapy with Imfinzi 1500 mg IV every 4 weeks.  First dose February 19, 2020.  Status post 11 cycles.  INTERVAL HISTORY: Joe [name] 75 y.o. male returns to the clinic today for follow-up visit.  The patient is feeling fine today with no concerning complaints except for mild inflammation and erythema of the right big toe.  He has a nail problem for a while before but he has been managing it on his own.  He denied having any current fever or chills.  He has no nausea, vomiting, diarrhea or constipation.  He has no headache or visual changes.  He has no weight loss or night sweats.  The patient denied having any significant chest pain, shortness of breath, cough or hemoptysis.  He is here today for evaluation before starting cycle #12 of his treatment.    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  MEDICATIONS:  Current Outpatient Medications  Medication Sig Dispense Refill   amLODipine (NORVASC) 5 MG tablet TAKE 1 TABLET BY MOUTH EVERY DAY 90 tablet 2   atenolol (TENORMIN) 25 MG tablet TAKE 1 TABLET BY MOUTH DAILY AS NEEDED FOR PALPITATIONS (Patient not taking: No sig reported) 90 tablet 3   Cholecalciferol 25 MCG (1000 UT) tablet Take 1,000 Units by mouth daily.     diazepam (VALIUM) 5 MG tablet TAKE 1 TABLET(5 MG) BY MOUTH EVERY 12 HOURS (Patient not taking: No sig reported) 180 tablet 1   docusate sodium (COLACE) 100 MG capsule Take 100 mg by mouth 2 (two) times daily.     ELIQUIS 5 MG TABS tablet TAKE 1 TABLET(5 MG) BY MOUTH TWICE DAILY 180 tablet 1   levothyroxine (SYNTHROID) 112 MCG tablet TAKE 1 TABLET(112 MCG) BY MOUTH DAILY  30 tablet 2   lovastatin (MEVACOR) 20 MG tablet TAKE 1 TABLET BY MOUTH EVERY NIGHT AT BEDTIME 90 tablet 2   Multiple Vitamin (MULTIVITAMIN) tablet Take 1 tablet by mouth daily. Centrum Silver.     Polyethyl Glycol-Propyl Glycol (SYSTANE) 0.4-0.3 % SOLN Place 1 drop into both eyes daily as needed (Dry eyes). Ultra     prochlorperazine (COMPAZINE) 10 MG tablet Take 1 tablet (10 mg total) by mouth every 6 (six) hours as needed. (Patient not taking: No sig reported) 30 tablet 2   No current facility-administered medications for this visit.    SURGICAL HISTORY:  Past Surgical History:  Procedure Laterality Date   BICEPS TENDON REPAIR     BRONCHIAL BIOPSY  10/23/2019   Procedure: BRONCHIAL BIOPSIES;  Surgeon: [name], MD;  Location: [facility] ENDOSCOPY;  Service: Pulmonary;;   BRONCHIAL BIOPSY  11/20/2019   Procedure: BRONCHIAL BIOPSIES;  Surgeon: [name], MD;  Location: [facility] ENDOSCOPY;  Service: Pulmonary;;   BRONCHIAL BRUSHINGS  10/23/2019   Procedure: BRONCHIAL BRUSHINGS;  Surgeon: [name], MD;  Location: [facility] ENDOSCOPY;  Service: Pulmonary;;   BRONCHIAL BRUSHINGS  11/20/2019   Procedure: BRONCHIAL BRUSHINGS;  Surgeon: [name], MD;  Location: [facility] ENDOSCOPY;  Service: Pulmonary;;   BRONCHIAL NEEDLE ASPIRATION BIOPSY  10/23/2019   Procedure: BRONCHIAL NEEDLE ASPIRATION BIOPSIES;  Surgeon: [name], MD;  Location: [facility] ENDOSCOPY;  Service: Pulmonary;;   BRONCHIAL NEEDLE ASPIRATION BIOPSY  11/20/2019   Procedure: BRONCHIAL NEEDLE ASPIRATION BIOPSIES;  Surgeon: [name], MD;  Location: [facility] ENDOSCOPY;  Service: Pulmonary;;   BRONCHIAL WASHINGS  11/20/2019   Procedure: BRONCHIAL WASHINGS;  Surgeon: [name], MD;  Location: [place];  Service: Pulmonary;;   CATARACT EXTRACTION Left    COLONOSCOPY  04-14-99   Dr Flossie Dibble polyp-TA in epic   POLYPECTOMY  04-14-99   POSTERIOR LAMINECTOMY / DECOMPRESSION CERVICAL SPINE     Dr Saintclair Halsted   RETINAL DETACHMENT SURGERY     left  eye, 2007 x2, 2008 x 3   ROTATOR CUFF REPAIR  2004   right   TONSILLECTOMY  [phone]   VIDEO BRONCHOSCOPY WITH ENDOBRONCHIAL NAVIGATION N/A 10/23/2019   Procedure: VIDEO BRONCHOSCOPY WITH ENDOBRONCHIAL NAVIGATION;  Surgeon: [name], MD;  Location: [facility] ENDOSCOPY;  Service: Pulmonary;  Laterality: N/A;   VIDEO BRONCHOSCOPY WITH ENDOBRONCHIAL NAVIGATION N/A 11/20/2019   Procedure: VIDEO BRONCHOSCOPY WITH ENDOBRONCHIAL NAVIGATION;  Surgeon: [name], MD;  Location: [facility] ENDOSCOPY;  Service: Pulmonary;  Laterality: N/A;   VIDEO BRONCHOSCOPY WITH ENDOBRONCHIAL ULTRASOUND N/A 10/23/2019   Procedure: VIDEO BRONCHOSCOPY WITH ENDOBRONCHIAL  ULTRASOUND;  Surgeon: [name], MD;  Location: [facility] ENDOSCOPY;  Service: Pulmonary;  Laterality: N/A;    REVIEW OF SYSTEMS:  A comprehensive review of systems was negative except for: Constitutional: positive for fatigue   PHYSICAL EXAMINATION: General appearance: alert, cooperative, and no distress Head: Normocephalic, without obvious abnormality, atraumatic Neck: no adenopathy, no JVD, supple, symmetrical, trachea midline, and thyroid not enlarged, symmetric, no tenderness/mass/nodules Lymph nodes: Cervical, supraclavicular, and axillary nodes normal. Resp: clear to auscultation bilaterally Back: symmetric, no curvature. ROM normal. No CVA tenderness. Cardio: regular rate and rhythm, S1, S2 normal, no murmur, click, rub or gallop GI: soft, non-tender; bowel sounds normal; no masses,  no organomegaly Extremities: extremities normal, atraumatic, no cyanosis or edema  ECOG PERFORMANCE STATUS: 1 - Symptomatic but completely ambulatory  Blood pressure 126/78, pulse 77, temperature (!) 97.2 F (36.2 C), temperature source Tympanic, resp. rate 20, height $RemoveBe'5\' 11"'JeRVmwSRG$  (1.803 m), weight 193 lb 1.6 oz (87.6 kg), SpO2 91 %.  LABORATORY DATA: Lab Results  Component Value Date   WBC 11.5 (H) 12/22/2020   HGB 15.1 12/22/2020   HCT 45.2 12/22/2020   MCV 89.5  12/22/2020   PLT 224 12/22/2020      Chemistry      Component Value Date/Time   NA 141 11/25/2020 0907   NA 143 03/10/2017 0911   K 4.1 11/25/2020 0907   K 4.7 03/10/2017 0911   CL 105 11/25/2020 0907   CO2 25 11/25/2020 0907   CO2 30 (H) 03/10/2017 0911   BUN 13 11/25/2020 0907   BUN 10.7 03/10/2017 0911   CREATININE 0.84 11/25/2020 0907   CREATININE 0.8 03/10/2017 0911      Component Value Date/Time   CALCIUM 10.2 11/25/2020 0907   CALCIUM 9.4 03/10/2017 0911   ALKPHOS 76 11/25/2020 0907   ALKPHOS 76 03/10/2017 0911   AST 29 11/25/2020 0907   AST 34 03/10/2017 0911   ALT 35 11/25/2020 0907   ALT 45 03/10/2017 0911   BILITOT 1.5 (H) 11/25/2020 0907   BILITOT 1.41 (H) 03/10/2017 0911       RADIOGRAPHIC STUDIES: No results found.   ASSESSMENT AND PLAN: This is a very pleasant 75 years old white male with a stage IIIb non-small cell lung cancer, squamous cell carcinoma diagnosed in August of 2021.  The patient also has a history of stage 0 CLL.  He underwent a course of concurrent chemoradiation with weekly carboplatin for AUC of 2 and paclitaxel 45 mg/M2 status post 5 cycles of the treatment.  He tolerated this treatment well with no concerning adverse effect except for mild odynophagia and dysphagia as well as thrombocytopenia. His scan showed improvement of his disease with decrease in the size of the left upper lobe lung mass as well as decrease in the mediastinal lymphadenopathy and no evidence of metastatic disease. The patient is currently undergoing consolidation treatment with immunotherapy with Imfinzi 1500 mg IV every 4 weeks status post 11 cycles. The patient has been tolerating his treatment with consolidation Imfinzi fairly well. I recommended for him to proceed with cycle #12 today as planned. For the hypothyroidism, he will continue levothyroxine 112 mcg p.o. daily and will adjust his dose according to the TSH. His leukocytosis secondary to history of CLL and  we will continue to monitor it closely. He will come back for follow-up visit in 4 weeks for evaluation before the next cycle of his treatment. The patient was advised to call immediately if he has any concerning  symptoms in the interval.  The patient voices understanding of current disease status and treatment options and is in agreement with the current care plan.  All questions were answered. The patient knows to call the clinic with any problems, questions or concerns. We can certainly see the patient much sooner if necessary.  Disclaimer: This note was dictated with voice recognition software. Similar sounding words can inadvertently be transcribed and may not be corrected upon review.

## 2020-12-24 ENCOUNTER — Other Ambulatory Visit: Payer: Self-pay

## 2020-12-24 ENCOUNTER — Ambulatory Visit (INDEPENDENT_AMBULATORY_CARE_PROVIDER_SITE_OTHER): Payer: Medicare Other | Admitting: Internal Medicine

## 2020-12-24 ENCOUNTER — Encounter: Payer: Self-pay | Admitting: Internal Medicine

## 2020-12-24 VITALS — BP 118/70 | HR 71 | Temp 97.8°F | Ht 71.0 in | Wt 192.4 lb

## 2020-12-24 DIAGNOSIS — Z23 Encounter for immunization: Secondary | ICD-10-CM | POA: Diagnosis not present

## 2020-12-24 DIAGNOSIS — I2510 Atherosclerotic heart disease of native coronary artery without angina pectoris: Secondary | ICD-10-CM

## 2020-12-24 DIAGNOSIS — E039 Hypothyroidism, unspecified: Secondary | ICD-10-CM

## 2020-12-24 DIAGNOSIS — I4892 Unspecified atrial flutter: Secondary | ICD-10-CM | POA: Diagnosis not present

## 2020-12-24 DIAGNOSIS — C3492 Malignant neoplasm of unspecified part of left bronchus or lung: Secondary | ICD-10-CM | POA: Diagnosis not present

## 2020-12-24 DIAGNOSIS — E785 Hyperlipidemia, unspecified: Secondary | ICD-10-CM

## 2020-12-24 DIAGNOSIS — R269 Unspecified abnormalities of gait and mobility: Secondary | ICD-10-CM | POA: Diagnosis not present

## 2020-12-24 DIAGNOSIS — N4889 Other specified disorders of penis: Secondary | ICD-10-CM

## 2020-12-24 DIAGNOSIS — J449 Chronic obstructive pulmonary disease, unspecified: Secondary | ICD-10-CM

## 2020-12-24 DIAGNOSIS — B351 Tinea unguium: Secondary | ICD-10-CM

## 2020-12-24 MED ORDER — CLOTRIMAZOLE-BETAMETHASONE 1-0.05 % EX CREA: TOPICAL_CREAM | Freq: Two times a day (BID) | CUTANEOUS | 2 refills | Status: DC

## 2020-12-24 MED ORDER — METHYLPREDNISOLONE 4 MG PO TBPK: ORAL_TABLET | ORAL | 0 refills | Status: DC

## 2020-12-24 NOTE — Progress Notes (Signed)
Subjective:  Patient ID: Joe [name], male    DOB: 1945-10-18  Age: 75 y.o. MRN: [id number]  CC: Nail Problem ((R) Big Toe- Flu shot)   HPI Joe [name] presents for lung cancer, A fib, hypothyroidism, HTN  Outpatient Medications Prior to Visit  Medication Sig Dispense Refill   amLODipine (NORVASC) 5 MG tablet TAKE 1 TABLET BY MOUTH EVERY DAY 90 tablet 2   Cholecalciferol 25 MCG (1000 UT) tablet Take 1,000 Units by mouth daily.     docusate sodium (COLACE) 100 MG capsule Take 100 mg by mouth 2 (two) times daily.     ELIQUIS 5 MG TABS tablet TAKE 1 TABLET(5 MG) BY MOUTH TWICE DAILY 180 tablet 1   levothyroxine (SYNTHROID) 112 MCG tablet TAKE 1 TABLET(112 MCG) BY MOUTH DAILY 30 tablet 2   lovastatin (MEVACOR) 20 MG tablet TAKE 1 TABLET BY MOUTH EVERY NIGHT AT BEDTIME 90 tablet 2   Multiple Vitamin (MULTIVITAMIN) tablet Take 1 tablet by mouth daily. Centrum Silver.     Polyethyl Glycol-Propyl Glycol (SYSTANE) 0.4-0.3 % SOLN Place 1 drop into both eyes daily as needed (Dry eyes). Ultra     prochlorperazine (COMPAZINE) 10 MG tablet Take 1 tablet (10 mg total) by mouth every 6 (six) hours as needed. 30 tablet 2   atenolol (TENORMIN) 25 MG tablet TAKE 1 TABLET BY MOUTH DAILY AS NEEDED FOR PALPITATIONS (Patient not taking: No sig reported) 90 tablet 3   diazepam (VALIUM) 5 MG tablet TAKE 1 TABLET(5 MG) BY MOUTH EVERY 12 HOURS (Patient not taking: No sig reported) 180 tablet 1   OVER THE COUNTER MEDICATION " Cream for yeast infection -jock itch"     No facility-administered medications prior to visit.    ROS: Review of Systems  Constitutional:  Negative for appetite change, fatigue and unexpected weight change.  HENT:  Negative for congestion, nosebleeds, sneezing, sore throat and trouble swallowing.   Eyes:  Negative for itching and visual disturbance.  Respiratory:  Negative for cough and shortness of breath.   Cardiovascular:  Negative for chest pain, palpitations and leg  swelling.  Gastrointestinal:  Negative for abdominal distention, blood in stool, diarrhea and nausea.  Genitourinary:  Negative for frequency and hematuria.  Musculoskeletal:  Positive for arthralgias, back pain and gait problem. Negative for joint swelling and neck pain.  Skin:  Negative for rash.  Neurological:  Negative for dizziness, tremors, speech difficulty and weakness.  Psychiatric/Behavioral:  Negative for agitation, dysphoric mood and sleep disturbance. The patient is not nervous/anxious.    Objective:  BP 118/70 (BP Location: Left Arm)   Pulse 71   Temp 97.8 F (36.6 C) (Oral)   Ht 5\' 11"  (1.803 m)   Wt 192 lb 6.4 oz (87.3 kg)   SpO2 94%   BMI 26.83 kg/m   BP Readings from Last 3 Encounters:  12/24/20 118/70  12/22/20 126/78  11/25/20 122/69    Wt Readings from Last 3 Encounters:  12/24/20 192 lb 6.4 oz (87.3 kg)  12/22/20 193 lb 1.6 oz (87.6 kg)  11/25/20 191 lb 0.8 oz (86.7 kg)    Physical Exam Constitutional:      General: He is not in acute distress.    Appearance: He is well-developed. He is obese.     Comments: NAD  Eyes:     Conjunctiva/sclera: Conjunctivae normal.     Pupils: Pupils are equal, round, and reactive to light.  Neck:     Thyroid: No thyromegaly.  Vascular: No JVD.  Cardiovascular:     Rate and Rhythm: Normal rate and regular rhythm.     Heart sounds: Normal heart sounds. No murmur heard.   No friction rub. No gallop.  Pulmonary:     Effort: Pulmonary effort is normal. No respiratory distress.     Breath sounds: Normal breath sounds. No wheezing or rales.  Chest:     Chest wall: No tenderness.  Abdominal:     General: Bowel sounds are normal. There is no distension.     Palpations: Abdomen is soft. There is no mass.     Tenderness: There is no abdominal tenderness. There is no guarding or rebound.  Musculoskeletal:        General: No tenderness. Normal range of motion.     Cervical back: Normal range of motion.   Lymphadenopathy:     Cervical: No cervical adenopathy.  Skin:    General: Skin is warm and dry.     Findings: No rash.  Neurological:     Mental Status: He is alert and oriented to person, place, and time.     Cranial Nerves: No cranial nerve deficit.     Motor: No abnormal muscle tone.     Coordination: Coordination abnormal.     Gait: Gait abnormal.     Deep Tendon Reflexes: Reflexes are normal and symmetric.  Psychiatric:        Behavior: Behavior normal.        Thought Content: Thought content normal.        Judgment: Judgment normal.   Using a cane Genitals - glans penis, shaft, skin irritation R big toe toenail - very thick  Lab Results  Component Value Date   WBC 11.5 (H) 12/22/2020   HGB 15.1 12/22/2020   HCT 45.2 12/22/2020   PLT 224 12/22/2020   GLUCOSE 109 (H) 12/22/2020   CHOL 153 11/23/2018   TRIG 201.0 (H) 11/23/2018   HDL 38.20 (L) 11/23/2018   LDLDIRECT 99.0 11/23/2018   LDLCALC 80 11/07/2017   ALT 26 12/22/2020   AST 22 12/22/2020   NA 141 12/22/2020   K 4.1 12/22/2020   CL 105 12/22/2020   CREATININE 0.82 12/22/2020   BUN 11 12/22/2020   CO2 25 12/22/2020   TSH 1.612 12/22/2020   PSA 0.23 11/23/2018   INR 1.4 (H) 10/15/2019    CT Chest W Contrast  Result Date: 10/27/2020 CLINICAL DATA:  Non-small cell lung cancer assessment of treatment response. Chemotherapy completed in October 2021, radiation therapy November 2021. Ongoing immunotherapy. EXAM: CT CHEST WITH CONTRAST TECHNIQUE: Multidetector CT imaging of the chest was performed during intravenous contrast administration. CONTRAST:  17mL OMNIPAQUE IOHEXOL 350 MG/ML SOLN COMPARISON:  07/30/2020 FINDINGS: Cardiovascular: Ascending thoracic aortic aneurysm 4.2 cm in diameter, stable by my measurements. Coronary, aortic arch, and branch vessel atherosclerotic vascular disease. Mediastinum/Nodes: Left lower paratracheal lymph node borderline prominent at 1.0 cm in diameter, image 58 series 2, stable.  Lungs/Pleura: Volume loss in the left hemithorax. Prominent centrilobular emphysema. Stable 8 mm right upper lobe subpleural nodule on image 37 series 7. Stable 5 by 4 mm right upper lobe nodule on image 77 series 7. Stable peripheral interstitial accentuation with reticulonodular components. 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 Mild increase in coarse interstitial accentuation anteriorly in the left upper lobe with some mild nodular component on  images 55-67 of series 7, a component of atypical infectious bronchiolitis is a distinct possibility. Slightly improved aeration inferiorly in the lingula although there is some residual reticulonodular opacity in this region is well as on image 103 series 7. Peripheral left lower lobe nodule 0.9 by 0.6 cm on image 87 series 7, previously 1.0 by 0.7 cm. Other subpleural nodules in the superior segment left lower lobe and posterior basal segment left lower lobe have some increase in size but a likely inflammatory appearance. Upper Abdomen: Unremarkable Musculoskeletal: Thoracic spondylosis. IMPRESSION: 1. 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 2. Emphysema with some scattered coarse interstitial accentuation peripherally in the lungs. Emphysema (ICD10-J43.9). 3. Suspected atypical infectious bronchiolitis in portions of the left upper lobe and left lower lobe, meriting surveillance. 4. Ascending thoracic aortic aneurysm 4.2 cm in diameter. Recommend annual imaging followup by CTA or MRA. This recommendation follows 2010 ACCF/AHA/AATS/ACR/ASA/SCA/SCAI/SIR/STS/SVM Guidelines for the Diagnosis and Management of Patients with Thoracic Aortic Disease. Circulation. 2010; 121: F810-F751. Aortic aneurysm NOS (ICD10-I71.9) 5. Stable borderline enlarged left lower  paratracheal lymph node. 6.  Aortic Atherosclerosis (ICD10-I70.0).  Coronary atherosclerosis. Electronically Signed   By: Van Clines M.D.   On: 10/27/2020 09:46    Assessment & Plan:   Problem List Items Addressed This Visit     Atrial flutter ([place])    Cont w/Eliquis; Tenormin 50 mg po prn HR 55-70 at home      COPD mixed type (HCC)    Off inhalers SOB is better      Relevant Medications   methylPREDNISolone (MEDROL DOSEPAK) 4 MG TBPK tablet   Dyslipidemia    Cont on Lovastatin      Gait disorder    Use a cane      Hypothyroidism    Worse on Imfinzi 1500 mg IV every 4 weeks. Abn TSH. FT3 and FT4 are normal. On Levothroid      Onychomycosis    R big toe - he will likely to loose it      Relevant Medications   clotrimazole-betamethasone (LOTRISONE) cream   Penile edema    Possibly related to his cancer Rx - will watch Use Lotrisone, Medrol pack if worse      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)    On Imfinzi 1500 mg IV every 4 weeks. Chest CT in 1 month or so      Relevant Medications   methylPREDNISolone (MEDROL DOSEPAK) 4 MG TBPK tablet   Other Visit Diagnoses     Needs flu shot    -  Primary   Relevant Orders   Flu Vaccine QUAD High Dose(Fluad) (Completed)         Follow-up: Return in about 3 months (around 03/26/2021) for a follow-up visit.  Walker Kehr, MD

## 2020-12-24 NOTE — Assessment & Plan Note (Signed)
Worse on Imfinzi 1500 mg IV every 4 weeks. Abn TSH. FT3 and FT4 are normal. On Levothroid

## 2020-12-24 NOTE — Assessment & Plan Note (Signed)
Cont on Lovastatin

## 2020-12-24 NOTE — Assessment & Plan Note (Signed)
R big toe - he will likely to loose it

## 2020-12-24 NOTE — Assessment & Plan Note (Signed)
Cont w/Eliquis; Tenormin 50 mg po prn HR 55-70 at home

## 2020-12-24 NOTE — Assessment & Plan Note (Signed)
Use a cane

## 2020-12-24 NOTE — Assessment & Plan Note (Signed)
On Imfinzi 1500 mg IV every 4 weeks. Chest CT in 1 month or so

## 2020-12-24 NOTE — Assessment & Plan Note (Signed)
Off inhalers SOB is better

## 2020-12-24 NOTE — Assessment & Plan Note (Signed)
Possibly related to his cancer Rx - will watch Use Lotrisone, Medrol pack if worse

## 2021-01-13 ENCOUNTER — Encounter: Payer: Self-pay | Admitting: Internal Medicine

## 2021-01-13 ENCOUNTER — Ambulatory Visit: Payer: Medicare Other | Attending: Internal Medicine

## 2021-01-13 ENCOUNTER — Other Ambulatory Visit (HOSPITAL_BASED_OUTPATIENT_CLINIC_OR_DEPARTMENT_OTHER): Payer: Self-pay

## 2021-01-13 DIAGNOSIS — Z23 Encounter for immunization: Secondary | ICD-10-CM

## 2021-01-13 MED ORDER — PFIZER COVID-19 VAC BIVALENT 30 MCG/0.3ML IM SUSP
INTRAMUSCULAR | 0 refills | Status: DC
  Filled 2021-01-13: qty 0.3, 1d supply, fill #0

## 2021-01-13 NOTE — Progress Notes (Signed)
   Covid-19 Vaccination Clinic  Name:  Joe [name]    MRN: [id number] DOB: 18-Jun-1945  01/13/2021  Mr. Diiorio was observed post Covid-19 immunization for 15 minutes without incident. He was provided with Vaccine Information Sheet and instruction to access the V-Safe system.   Mr. Dentinger was instructed to call [phone] with any severe reactions post vaccine: Difficulty breathing  Swelling of face and throat  A fast heartbeat  A bad rash all over body  Dizziness and weakness   Immunizations Administered     Name Date Dose VIS Date Route   Pfizer Covid-19 Vaccine Bivalent Booster 01/13/2021 10:26 AM 0.3 mL 11/19/2020 Intramuscular   Manufacturer: [place]   Lot: [id number]   [place]: [phone]

## 2021-01-19 ENCOUNTER — Inpatient Hospital Stay: Payer: Medicare Other

## 2021-01-19 ENCOUNTER — Encounter: Payer: Self-pay | Admitting: Internal Medicine

## 2021-01-19 ENCOUNTER — Inpatient Hospital Stay (HOSPITAL_BASED_OUTPATIENT_CLINIC_OR_DEPARTMENT_OTHER): Payer: Medicare Other | Admitting: Internal Medicine

## 2021-01-19 ENCOUNTER — Other Ambulatory Visit: Payer: Self-pay

## 2021-01-19 VITALS — BP 143/76 | HR 82 | Temp 96.3°F | Resp 20 | Ht 71.0 in | Wt 195.2 lb

## 2021-01-19 DIAGNOSIS — C3492 Malignant neoplasm of unspecified part of left bronchus or lung: Secondary | ICD-10-CM

## 2021-01-19 DIAGNOSIS — Z79899 Other long term (current) drug therapy: Secondary | ICD-10-CM | POA: Diagnosis not present

## 2021-01-19 DIAGNOSIS — Z5112 Encounter for antineoplastic immunotherapy: Secondary | ICD-10-CM | POA: Diagnosis not present

## 2021-01-19 DIAGNOSIS — C3490 Malignant neoplasm of unspecified part of unspecified bronchus or lung: Secondary | ICD-10-CM

## 2021-01-19 DIAGNOSIS — C3412 Malignant neoplasm of upper lobe, left bronchus or lung: Secondary | ICD-10-CM | POA: Diagnosis not present

## 2021-01-19 DIAGNOSIS — E039 Hypothyroidism, unspecified: Secondary | ICD-10-CM

## 2021-01-19 DIAGNOSIS — D72829 Elevated white blood cell count, unspecified: Secondary | ICD-10-CM | POA: Diagnosis not present

## 2021-01-19 LAB — CBC WITH DIFFERENTIAL (CANCER CENTER ONLY)
Abs Immature Granulocytes: 0.06 K/uL (ref 0.00–0.07)
Basophils Absolute: 0.1 K/uL (ref 0.0–0.1)
Basophils Relative: 0 %
Eosinophils Absolute: 0.2 K/uL (ref 0.0–0.5)
Eosinophils Relative: 1 %
HCT: 45.6 % (ref 39.0–52.0)
Hemoglobin: 14.8 g/dL (ref 13.0–17.0)
Immature Granulocytes: 0 %
Lymphocytes Relative: 18 %
Lymphs Abs: 2.5 K/uL (ref 0.7–4.0)
MCH: 29.3 pg (ref 26.0–34.0)
MCHC: 32.5 g/dL (ref 30.0–36.0)
MCV: 90.3 fL (ref 80.0–100.0)
Monocytes Absolute: 1.4 K/uL — ABNORMAL HIGH (ref 0.1–1.0)
Monocytes Relative: 10 %
Neutro Abs: 9.9 K/uL — ABNORMAL HIGH (ref 1.7–7.7)
Neutrophils Relative %: 71 %
Platelet Count: 251 K/uL (ref 150–400)
RBC: 5.05 MIL/uL (ref 4.22–5.81)
RDW: 13.2 % (ref 11.5–15.5)
WBC Count: 14 K/uL — ABNORMAL HIGH (ref 4.0–10.5)
nRBC: 0 % (ref 0.0–0.2)

## 2021-01-19 LAB — CMP (CANCER CENTER ONLY)
ALT: 18 U/L (ref 0–44)
AST: 18 U/L (ref 15–41)
Albumin: 3.7 g/dL (ref 3.5–5.0)
Alkaline Phosphatase: 84 U/L (ref 38–126)
Anion gap: 8 (ref 5–15)
BUN: 9 mg/dL (ref 8–23)
CO2: 27 mmol/L (ref 22–32)
Calcium: 9.5 mg/dL (ref 8.9–10.3)
Chloride: 106 mmol/L (ref 98–111)
Creatinine: 0.82 mg/dL (ref 0.61–1.24)
GFR, Estimated: >60 mL/min (ref >60)
Glucose, Bld: 107 mg/dL — ABNORMAL HIGH (ref 70–99)
Potassium: 4.1 mmol/L (ref 3.5–5.1)
Sodium: 141 mmol/L (ref 135–145)
Total Bilirubin: 1 mg/dL (ref 0.3–1.2)
Total Protein: 7.4 g/dL (ref 6.5–8.1)

## 2021-01-19 LAB — TSH: TSH: 2.085 u[IU]/mL (ref 0.320–4.118)

## 2021-01-19 MED ORDER — SODIUM CHLORIDE 0.9 % IV SOLN
1500.0000 mg | Freq: Once | INTRAVENOUS | Status: AC
  Administered 2021-01-19: 1500 mg via INTRAVENOUS
  Filled 2021-01-19: qty 30

## 2021-01-19 MED ORDER — SODIUM CHLORIDE 0.9 % IV SOLN: Freq: Once | INTRAVENOUS | Status: AC

## 2021-01-19 NOTE — Progress Notes (Signed)
[place] [phone]   [phone]  OFFICE PROGRESS NOTE  [name], [name], MD [place] [address]  DIAGNOSIS: 1) Stage IIIb non-small cell lung cancer, squamous cell carcinoma.  The patient presented with a left upper lobe lung mass as well as associated mediastinal lymphadenopathy and contralateral right hilar mild hypermetabolism.  There was heterogeneous marrow uptake in the spine but with more focal areas at L4 without imaging correlation on CT scan.  The patient was diagnosed in August 2021. 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  First dose expected on 12/13/2019. Status post 5 cycles.   Last dose was giving January 14, 2020.   CURRENT THERAPY:  Consolidation treatment with immunotherapy with Imfinzi 1500 mg IV every 4 weeks.  First dose February 19, 2020.  Status post 12 cycles.  INTERVAL HISTORY: Joe [name] 75 y.o. male returns to the clinic today for follow-up visit.  The patient is feeling fine today with no concerning complaints except for nail fungal infection and treated by his primary care physician.  He denied having any current chest pain, shortness of breath, cough or hemoptysis.  He denied having any fever or chills.  He has no nausea, vomiting, diarrhea or constipation.  He has no headache or visual changes.  He has been tolerating his treatment with immunotherapy fairly well.  He is here for evaluation before starting cycle #13 of his treatment.  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   chronic stage 1- no symtoms   CLL (chronic lymphocytic leukemia) ([place]) 08/08/2014   2014/05/10  Dr Burr Medico Stage 0   COPD mixed type ([place]) 07/26/2007   Smoker - stopped 6/14    De Quervain's tenosynovitis, right December 17, 2013   05-10-13    Depression    at times   Dysrhythmia    PAF   Dysuria 12/17/13   9/15 - poss stricture Urol ref was offered    Gallstones 11/16/2017   Asymptomatic Pt refused surg ref   Generalized anxiety disorder 09/07/2012   Chronic   Potential benefits of a long term steroid  use as well as potential risks  and complications were explained to the patient and were aknowledged.      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  MEDICATIONS:  Current Outpatient Medications  Medication Sig Dispense Refill  amLODipine (NORVASC) 5 MG tablet TAKE 1 TABLET BY MOUTH EVERY DAY 90 tablet 2   atenolol (TENORMIN) 25 MG tablet TAKE 1 TABLET BY MOUTH DAILY AS NEEDED FOR PALPITATIONS (Patient not taking: No sig reported) 90 tablet 3   Cholecalciferol 25 MCG (1000 UT) tablet Take 1,000 Units by mouth daily.     clotrimazole-betamethasone (LOTRISONE) cream Apply topically 2 (two) times daily. 45 g 2   COVID-19 mRNA bivalent vaccine, Pfizer, (PFIZER COVID-19 VAC BIVALENT) injection Inject into the muscle. 0.3 mL 0   diazepam (VALIUM) 5 MG tablet TAKE 1 TABLET(5 MG) BY MOUTH EVERY 12 HOURS (Patient not taking: No sig reported) 180 tablet 1   docusate sodium (COLACE) 100 MG capsule Take 100 mg by mouth 2 (two) times daily.     ELIQUIS 5 MG TABS tablet  TAKE 1 TABLET(5 MG) BY MOUTH TWICE DAILY 180 tablet 1   levothyroxine (SYNTHROID) 112 MCG tablet TAKE 1 TABLET(112 MCG) BY MOUTH DAILY 30 tablet 2   lovastatin (MEVACOR) 20 MG tablet TAKE 1 TABLET BY MOUTH EVERY NIGHT AT BEDTIME 90 tablet 2   methylPREDNISolone (MEDROL DOSEPAK) 4 MG TBPK tablet As directed 21 tablet 0   Multiple Vitamin (MULTIVITAMIN) tablet Take 1 tablet by mouth daily. Centrum Silver.     Polyethyl Glycol-Propyl Glycol (SYSTANE) 0.4-0.3 % SOLN Place 1 drop into both eyes daily as needed (Dry eyes). Ultra     prochlorperazine (COMPAZINE) 10 MG tablet Take 1 tablet (10 mg total) by mouth every 6 (six) hours as needed. 30 tablet 2   No current facility-administered medications for this visit.    SURGICAL HISTORY:  Past Surgical History:  Procedure Laterality Date   BICEPS TENDON REPAIR     BRONCHIAL BIOPSY  10/23/2019   Procedure: BRONCHIAL BIOPSIES;  Surgeon: [name], MD;  Location: [facility] ENDOSCOPY;  Service: Pulmonary;;   BRONCHIAL BIOPSY  11/20/2019   Procedure: BRONCHIAL BIOPSIES;  Surgeon: [name], MD;  Location: [facility] ENDOSCOPY;  Service: Pulmonary;;   BRONCHIAL BRUSHINGS  10/23/2019   Procedure: BRONCHIAL BRUSHINGS;  Surgeon: [name], MD;  Location: [facility] ENDOSCOPY;  Service: Pulmonary;;   BRONCHIAL BRUSHINGS  11/20/2019   Procedure: BRONCHIAL BRUSHINGS;  Surgeon: [name], MD;  Location: [facility] ENDOSCOPY;  Service: Pulmonary;;   BRONCHIAL NEEDLE ASPIRATION BIOPSY  10/23/2019   Procedure: BRONCHIAL NEEDLE ASPIRATION BIOPSIES;  Surgeon: [name], MD;  Location: [facility] ENDOSCOPY;  Service: Pulmonary;;   BRONCHIAL NEEDLE ASPIRATION BIOPSY  11/20/2019   Procedure: BRONCHIAL NEEDLE ASPIRATION BIOPSIES;  Surgeon: [name], MD;  Location: [facility] ENDOSCOPY;  Service: Pulmonary;;   BRONCHIAL WASHINGS  11/20/2019   Procedure: BRONCHIAL WASHINGS;  Surgeon: [name], MD;  Location: [place];  Service: Pulmonary;;   CATARACT EXTRACTION Left     COLONOSCOPY  04-14-99   Dr Flossie Dibble polyp-TA in epic   POLYPECTOMY  04-14-99   POSTERIOR LAMINECTOMY / DECOMPRESSION CERVICAL SPINE     Dr Saintclair Halsted   RETINAL DETACHMENT SURGERY     left eye, 2007 x2, 2008 x 3   ROTATOR CUFF REPAIR  2004   right   TONSILLECTOMY  [id number]   VIDEO BRONCHOSCOPY WITH ENDOBRONCHIAL NAVIGATION N/A 10/23/2019   Procedure: VIDEO BRONCHOSCOPY WITH ENDOBRONCHIAL NAVIGATION;  Surgeon: [name], MD;  Location: [facility] ENDOSCOPY;  Service: Pulmonary;  Laterality: N/A;   VIDEO BRONCHOSCOPY WITH ENDOBRONCHIAL NAVIGATION N/A 11/20/2019   Procedure: VIDEO BRONCHOSCOPY WITH ENDOBRONCHIAL NAVIGATION;  Surgeon: [name], MD;  Location: [facility] ENDOSCOPY;  Service: Pulmonary;  Laterality: N/A;   VIDEO BRONCHOSCOPY WITH ENDOBRONCHIAL ULTRASOUND N/A 10/23/2019   Procedure: VIDEO BRONCHOSCOPY WITH ENDOBRONCHIAL ULTRASOUND;  Surgeon: [name], MD;  Location: [facility] ENDOSCOPY;  Service: Pulmonary;  Laterality: N/A;    REVIEW OF SYSTEMS:  A comprehensive review of systems was negative except for: Constitutional: positive for fatigue   PHYSICAL EXAMINATION: General appearance: alert, cooperative, and no distress Head: Normocephalic, without obvious abnormality, atraumatic Neck: no adenopathy, no JVD, supple, symmetrical, trachea midline, and thyroid not enlarged, symmetric, no tenderness/mass/nodules Lymph nodes: Cervical, supraclavicular, and axillary nodes normal. Resp: clear to auscultation bilaterally Back: symmetric, no curvature. ROM normal. No CVA tenderness. Cardio: regular rate and rhythm, S1, S2 normal, no murmur, click, rub or gallop GI: soft, non-tender; bowel sounds normal; no masses,  no organomegaly Extremities: extremities normal, atraumatic, no cyanosis or edema  ECOG PERFORMANCE STATUS: 1 - Symptomatic but completely ambulatory  Blood pressure (!) 143/76, pulse 82, temperature (!) 96.3 F (35.7 C), temperature source Tympanic, resp. rate 20, height _0  (1.803  m), weight 195 lb 3.2 oz (88.5 kg), SpO2 94 %.  LABORATORY DATA: Lab Results  Component Value Date   WBC 11.5 (H) 12/22/2020   HGB 15.1 12/22/2020   HCT 45.2 12/22/2020   MCV 89.5 12/22/2020   PLT 224 12/22/2020      Chemistry      Component Value Date/Time   NA 141 12/22/2020 0905   NA 143 03/10/2017 0911   K 4.1 12/22/2020 0905   K 4.7 03/10/2017 0911   CL 105 12/22/2020 0905   CO2 25 12/22/2020 0905   CO2 30 (H) 03/10/2017 0911   BUN 11 12/22/2020 0905   BUN 10.7 03/10/2017 0911   CREATININE 0.82 12/22/2020 0905   CREATININE 0.8 03/10/2017 0911      Component Value Date/Time   CALCIUM 9.9 12/22/2020 0905   CALCIUM 9.4 03/10/2017 0911   ALKPHOS 84 12/22/2020 0905   ALKPHOS 76 03/10/2017 0911   AST 22 12/22/2020 0905   AST 34 03/10/2017 0911   ALT 26 12/22/2020 0905   ALT 45 03/10/2017 0911   BILITOT 1.1 12/22/2020 0905   BILITOT 1.41 (H) 03/10/2017 0911       RADIOGRAPHIC STUDIES: No results found.   ASSESSMENT AND PLAN: This is a very pleasant 75 years old white male with a stage IIIb non-small cell lung cancer, squamous cell carcinoma diagnosed in August of 2021.  The patient also has a history of stage 0 CLL.  He underwent a course of concurrent chemoradiation with weekly carboplatin for AUC of 2 and paclitaxel 45 mg/M2 status post 5 cycles of the treatment.  He tolerated this treatment well with no concerning adverse effect except for mild odynophagia and dysphagia as well as thrombocytopenia. His scan showed improvement of his disease with decrease in the size of the left upper lobe lung mass as well as decrease in the mediastinal lymphadenopathy and no evidence of metastatic disease. The patient is currently undergoing consolidation treatment with immunotherapy with Imfinzi 1500 mg IV every 4 weeks status post 12 cycles. The patient has been tolerating his treatment with Imfinzi fairly well with no concerning adverse effects. I recommended for him to proceed  with the last cycle of his consolidation treatment today as planned. I will see him back for follow-up visit in 4 weeks for evaluation with repeat CT scan of the chest for restaging of his disease. For the hypothyroidism, he will continue levothyroxine 112 mcg p.o. daily  and will adjust his dose according to the TSH. His leukocytosis secondary to history of CLL and we will continue to monitor it closely. The patient was advised to call immediately if he has any other concerning symptoms in the interval. The patient voices understanding of current disease status and treatment options and is in agreement with the current care plan.  All questions were answered. The patient knows to call the clinic with any problems, questions or concerns. We can certainly see the patient much sooner if necessary.  Disclaimer: This note was dictated with voice recognition software. Similar sounding words can inadvertently be transcribed and may not be corrected upon review.

## 2021-01-19 NOTE — Patient Instructions (Signed)
[facility]ICAL [facility]   Discharge Instructions: Thank you for choosing [facility] to provide your oncology and hematology care.   If you have a lab appointment with the Cancer Center, please go directly to the Cancer Center and check in at the registration area.   Wear comfortable clothing and clothing appropriate for easy access to any Portacath or PICC line.   We strive to give you quality time with your provider. You may need to reschedule your appointment if you arrive late (15 or more minutes).  Arriving late affects you and other patients whose appointments are after yours.  Also, if you miss three or more appointments without notifying the office, you may be dismissed from the clinic at the provider's discretion.      For prescription refill requests, have your pharmacy contact our office and allow 72 hours for refills to be completed.    Today you received the following chemotherapy and/or immunotherapy agents: durvalumab.      To help prevent nausea and vomiting after your treatment, we encourage you to take your nausea medication as directed.  BELOW ARE SYMPTOMS THAT SHOULD BE REPORTED IMMEDIATELY: *FEVER GREATER THAN 100.4 F (38 C) OR HIGHER *CHILLS OR SWEATING *NAUSEA AND VOMITING THAT IS NOT CONTROLLED WITH YOUR NAUSEA MEDICATION *UNUSUAL SHORTNESS OF BREATH *UNUSUAL BRUISING OR BLEEDING *URINARY PROBLEMS (pain or burning when urinating, or frequent urination) *BOWEL PROBLEMS (unusual diarrhea, constipation, pain near the anus) TENDERNESS IN MOUTH AND THROAT WITH OR WITHOUT PRESENCE OF ULCERS (sore throat, sores in mouth, or a toothache) UNUSUAL RASH, SWELLING OR PAIN  UNUSUAL VAGINAL DISCHARGE OR ITCHING   Items with * indicate a potential emergency and should be followed up as soon as possible or go to the Emergency Department if any problems should occur.  Please show the CHEMOTHERAPY ALERT CARD or IMMUNOTHERAPY ALERT CARD at check-in  to the Emergency Department and triage nurse.  Should you have questions after your visit or need to cancel or reschedule your appointment, please contact [facility]  Dept: [phone]  and follow the prompts.  Office hours are 8:00 a.m. to 4:30 p.m. Monday - Friday. Please note that voicemails left after 4:00 p.m. may not be returned until the following business day.  We are closed weekends and major holidays. You have access to a nurse at all times for urgent questions. Please call the main number to the clinic Dept: [phone] and follow the prompts.   For any non-urgent questions, you may also contact your provider using MyChart. We now offer e-Visits for anyone 18 and older to request care online for non-urgent symptoms. For details visit [email or web address].   Also download the MyChart app! Go to the app store, search "MyChart", open the app, select [facility], and log in with your MyChart username and password.  Due to Covid, a mask is required upon entering the hospital/clinic. If you do not have a mask, one will be given to you upon arrival. For doctor visits, patients may have 1 support person aged 18 or older with them. For treatment visits, patients cannot have anyone with them due to current Covid guidelines and our immunocompromised population.   

## 2021-01-21 ENCOUNTER — Telehealth: Payer: Self-pay | Admitting: Internal Medicine

## 2021-01-21 NOTE — Telephone Encounter (Signed)
Scheduled follow-up appointments per 10/31 los. Patient is aware.

## 2021-01-27 ENCOUNTER — Other Ambulatory Visit: Payer: Self-pay

## 2021-01-27 DIAGNOSIS — I351 Nonrheumatic aortic (valve) insufficiency: Secondary | ICD-10-CM

## 2021-01-27 NOTE — Addendum Note (Signed)
Addended by: Antonieta Iba on: 01/27/2021 09:08 AM   Modules accepted: Orders

## 2021-01-28 ENCOUNTER — Ambulatory Visit (HOSPITAL_COMMUNITY): Payer: Medicare Other

## 2021-02-09 ENCOUNTER — Other Ambulatory Visit: Payer: Self-pay

## 2021-02-09 ENCOUNTER — Inpatient Hospital Stay (HOSPITAL_COMMUNITY)
Admission: EM | Admit: 2021-02-09 | Discharge: 2021-02-13 | DRG: 189 | Disposition: A | Discharge status: Home Health | Payer: Medicare Other | Attending: Internal Medicine | Admitting: Internal Medicine

## 2021-02-09 ENCOUNTER — Emergency Department (HOSPITAL_COMMUNITY): Payer: Medicare Other

## 2021-02-09 ENCOUNTER — Encounter (HOSPITAL_COMMUNITY): Payer: Self-pay

## 2021-02-09 DIAGNOSIS — Z833 Family history of diabetes mellitus: Secondary | ICD-10-CM

## 2021-02-09 DIAGNOSIS — I10 Essential (primary) hypertension: Secondary | ICD-10-CM | POA: Diagnosis not present

## 2021-02-09 DIAGNOSIS — G4733 Obstructive sleep apnea (adult) (pediatric): Secondary | ICD-10-CM | POA: Diagnosis present

## 2021-02-09 DIAGNOSIS — F32A Depression, unspecified: Secondary | ICD-10-CM | POA: Diagnosis present

## 2021-02-09 DIAGNOSIS — F411 Generalized anxiety disorder: Secondary | ICD-10-CM | POA: Diagnosis present

## 2021-02-09 DIAGNOSIS — Z87891 Personal history of nicotine dependence: Secondary | ICD-10-CM

## 2021-02-09 DIAGNOSIS — C9110 Chronic lymphocytic leukemia of B-cell type not having achieved remission: Secondary | ICD-10-CM | POA: Diagnosis present

## 2021-02-09 DIAGNOSIS — R0902 Hypoxemia: Secondary | ICD-10-CM | POA: Diagnosis not present

## 2021-02-09 DIAGNOSIS — R0602 Shortness of breath: Secondary | ICD-10-CM | POA: Diagnosis not present

## 2021-02-09 DIAGNOSIS — Z7901 Long term (current) use of anticoagulants: Secondary | ICD-10-CM

## 2021-02-09 DIAGNOSIS — J189 Pneumonia, unspecified organism: Secondary | ICD-10-CM | POA: Diagnosis not present

## 2021-02-09 DIAGNOSIS — C3492 Malignant neoplasm of unspecified part of left bronchus or lung: Secondary | ICD-10-CM | POA: Diagnosis not present

## 2021-02-09 DIAGNOSIS — T50Z15A Adverse effect of immunoglobulin, initial encounter: Secondary | ICD-10-CM | POA: Diagnosis present

## 2021-02-09 DIAGNOSIS — R002 Palpitations: Secondary | ICD-10-CM | POA: Diagnosis present

## 2021-02-09 DIAGNOSIS — Z803 Family history of malignant neoplasm of breast: Secondary | ICD-10-CM

## 2021-02-09 DIAGNOSIS — Z79890 Hormone replacement therapy (postmenopausal): Secondary | ICD-10-CM

## 2021-02-09 DIAGNOSIS — J449 Chronic obstructive pulmonary disease, unspecified: Secondary | ICD-10-CM | POA: Diagnosis present

## 2021-02-09 DIAGNOSIS — Z79899 Other long term (current) drug therapy: Secondary | ICD-10-CM

## 2021-02-09 DIAGNOSIS — Z6827 Body mass index (BMI) 27.0-27.9, adult: Secondary | ICD-10-CM

## 2021-02-09 DIAGNOSIS — R7989 Other specified abnormal findings of blood chemistry: Secondary | ICD-10-CM | POA: Diagnosis present

## 2021-02-09 DIAGNOSIS — Z923 Personal history of irradiation: Secondary | ICD-10-CM

## 2021-02-09 DIAGNOSIS — J8489 Other specified interstitial pulmonary diseases: Secondary | ICD-10-CM | POA: Diagnosis not present

## 2021-02-09 DIAGNOSIS — Z5320 Procedure and treatment not carried out because of patient's decision for unspecified reasons: Secondary | ICD-10-CM | POA: Diagnosis not present

## 2021-02-09 DIAGNOSIS — E039 Hypothyroidism, unspecified: Secondary | ICD-10-CM | POA: Diagnosis present

## 2021-02-09 DIAGNOSIS — J432 Centrilobular emphysema: Secondary | ICD-10-CM | POA: Diagnosis not present

## 2021-02-09 DIAGNOSIS — Z20822 Contact with and (suspected) exposure to covid-19: Secondary | ICD-10-CM | POA: Diagnosis present

## 2021-02-09 DIAGNOSIS — I7121 Aneurysm of the ascending aorta, without rupture: Secondary | ICD-10-CM | POA: Diagnosis not present

## 2021-02-09 DIAGNOSIS — I480 Paroxysmal atrial fibrillation: Secondary | ICD-10-CM | POA: Diagnosis present

## 2021-02-09 DIAGNOSIS — R627 Adult failure to thrive: Secondary | ICD-10-CM | POA: Diagnosis present

## 2021-02-09 DIAGNOSIS — J9601 Acute respiratory failure with hypoxia: Principal | ICD-10-CM | POA: Diagnosis present

## 2021-02-09 DIAGNOSIS — E785 Hyperlipidemia, unspecified: Secondary | ICD-10-CM | POA: Diagnosis present

## 2021-02-09 DIAGNOSIS — K219 Gastro-esophageal reflux disease without esophagitis: Secondary | ICD-10-CM | POA: Diagnosis present

## 2021-02-09 DIAGNOSIS — N62 Hypertrophy of breast: Secondary | ICD-10-CM | POA: Diagnosis present

## 2021-02-09 DIAGNOSIS — Z8249 Family history of ischemic heart disease and other diseases of the circulatory system: Secondary | ICD-10-CM

## 2021-02-09 DIAGNOSIS — Z825 Family history of asthma and other chronic lower respiratory diseases: Secondary | ICD-10-CM

## 2021-02-09 LAB — CBC WITH DIFFERENTIAL/PLATELET
Abs Immature Granulocytes: 0.27 10*3/uL — ABNORMAL HIGH (ref 0.00–0.07)
Basophils Absolute: 0.1 10*3/uL (ref 0.0–0.1)
Basophils Relative: 0 %
Eosinophils Absolute: 0 10*3/uL (ref 0.0–0.5)
Eosinophils Relative: 0 %
HCT: 45.5 % (ref 39.0–52.0)
Hemoglobin: 14.5 g/dL (ref 13.0–17.0)
Immature Granulocytes: 1 %
Lymphocytes Relative: 10 %
Lymphs Abs: 2.7 10*3/uL (ref 0.7–4.0)
MCH: 29.1 pg (ref 26.0–34.0)
MCHC: 31.9 g/dL (ref 30.0–36.0)
MCV: 91.2 fL (ref 80.0–100.0)
Monocytes Absolute: 2.4 10*3/uL — ABNORMAL HIGH (ref 0.1–1.0)
Monocytes Relative: 9 %
Neutro Abs: 22.1 10*3/uL — ABNORMAL HIGH (ref 1.7–7.7)
Neutrophils Relative %: 80 %
Platelets: 366 10*3/uL (ref 150–400)
RBC: 4.99 MIL/uL (ref 4.22–5.81)
RDW: 13.2 % (ref 11.5–15.5)
WBC: 27.6 10*3/uL — ABNORMAL HIGH (ref 4.0–10.5)
nRBC: 0 % (ref 0.0–0.2)

## 2021-02-09 LAB — COMPREHENSIVE METABOLIC PANEL
ALT: 31 U/L (ref 0–44)
AST: 48 U/L — ABNORMAL HIGH (ref 15–41)
Albumin: 3.5 g/dL (ref 3.5–5.0)
Alkaline Phosphatase: 89 U/L (ref 38–126)
Anion gap: 13 (ref 5–15)
BUN: 17 mg/dL (ref 8–23)
CO2: 25 mmol/L (ref 22–32)
Calcium: 8.8 mg/dL — ABNORMAL LOW (ref 8.9–10.3)
Chloride: 96 mmol/L — ABNORMAL LOW (ref 98–111)
Creatinine, Ser: 0.85 mg/dL (ref 0.61–1.24)
GFR, Estimated: >60 mL/min (ref >60)
Glucose, Bld: 126 mg/dL — ABNORMAL HIGH (ref 70–99)
Potassium: 3.6 mmol/L (ref 3.5–5.1)
Sodium: 134 mmol/L — ABNORMAL LOW (ref 135–145)
Total Bilirubin: 1.5 mg/dL — ABNORMAL HIGH (ref 0.3–1.2)
Total Protein: 7.8 g/dL (ref 6.5–8.1)

## 2021-02-09 LAB — RESP PANEL BY RT-PCR (FLU A&B, COVID) ARPGX2
Influenza A by PCR: NEGATIVE
Influenza B by PCR: NEGATIVE
SARS Coronavirus 2 by RT PCR: NEGATIVE

## 2021-02-09 LAB — PROCALCITONIN: Procalcitonin: 0.79 ng/mL

## 2021-02-09 MED ORDER — LEVOTHYROXINE SODIUM 112 MCG PO TABS: 112.0000 ug | ORAL_TABLET | Freq: Every day | ORAL | Status: DC

## 2021-02-09 MED ORDER — PREDNISONE 20 MG PO TABS
60.0000 mg | ORAL_TABLET | Freq: Every day | ORAL | Status: DC
  Administered 2021-02-10 – 2021-02-13 (×4): 60 mg via ORAL
  Filled 2021-02-09 (×4): qty 3

## 2021-02-09 MED ORDER — POLYVINYL ALCOHOL 1.4 % OP SOLN
1.0000 [drp] | Freq: Every day | OPHTHALMIC | Status: DC | PRN
  Filled 2021-02-09: qty 15

## 2021-02-09 MED ORDER — DIAZEPAM 5 MG PO TABS: 5.0000 mg | ORAL_TABLET | Freq: Two times a day (BID) | ORAL | Status: DC | PRN

## 2021-02-09 MED ORDER — POTASSIUM CHLORIDE CRYS ER 20 MEQ PO TBCR
20.0000 meq | EXTENDED_RELEASE_TABLET | Freq: Once | ORAL | Status: AC
  Administered 2021-02-09: 20 meq via ORAL
  Filled 2021-02-09: qty 1

## 2021-02-09 MED ORDER — IPRATROPIUM-ALBUTEROL 0.5-2.5 (3) MG/3ML IN SOLN: 3.0000 mL | RESPIRATORY_TRACT | Status: DC | PRN

## 2021-02-09 MED ORDER — IOHEXOL 350 MG/ML SOLN
75.0000 mL | Freq: Once | INTRAVENOUS | Status: AC | PRN
  Administered 2021-02-09: 75 mL via INTRAVENOUS

## 2021-02-09 MED ORDER — ATENOLOL 25 MG PO TABS: 25.0000 mg | ORAL_TABLET | Freq: Every day | ORAL | Status: DC | PRN

## 2021-02-09 MED ORDER — ACETAMINOPHEN 325 MG PO TABS: 650.0000 mg | ORAL_TABLET | Freq: Four times a day (QID) | ORAL | Status: DC | PRN

## 2021-02-09 MED ORDER — ONDANSETRON HCL 4 MG/2ML IJ SOLN: 4.0000 mg | Freq: Four times a day (QID) | INTRAMUSCULAR | Status: DC | PRN

## 2021-02-09 MED ORDER — AMLODIPINE BESYLATE 5 MG PO TABS
5.0000 mg | ORAL_TABLET | Freq: Every evening | ORAL | Status: DC
  Administered 2021-02-09: 5 mg via ORAL
  Filled 2021-02-09: qty 1

## 2021-02-09 MED ORDER — ACETAMINOPHEN 650 MG RE SUPP: 650.0000 mg | Freq: Four times a day (QID) | RECTAL | Status: DC | PRN

## 2021-02-09 MED ORDER — SODIUM CHLORIDE 0.9 % IV SOLN
1.0000 g | Freq: Every day | INTRAVENOUS | Status: DC
  Administered 2021-02-09 – 2021-02-13 (×5): 1 g via INTRAVENOUS
  Filled 2021-02-09 (×5): qty 10

## 2021-02-09 MED ORDER — SODIUM CHLORIDE 0.9 % IV SOLN
500.0000 mg | Freq: Every day | INTRAVENOUS | Status: DC
  Administered 2021-02-09 – 2021-02-13 (×5): 500 mg via INTRAVENOUS
  Filled 2021-02-09 (×5): qty 500

## 2021-02-09 MED ORDER — PREDNISONE 50 MG PO TABS: 50.0000 mg | ORAL_TABLET | Freq: Every day | ORAL | Status: DC

## 2021-02-09 MED ORDER — PRAVASTATIN SODIUM 20 MG PO TABS
20.0000 mg | ORAL_TABLET | Freq: Every day | ORAL | Status: DC
  Administered 2021-02-09 – 2021-02-12 (×4): 20 mg via ORAL
  Filled 2021-02-09 (×4): qty 1

## 2021-02-09 MED ORDER — ENOXAPARIN SODIUM 40 MG/0.4ML IJ SOSY: 40.0000 mg | PREFILLED_SYRINGE | INTRAMUSCULAR | Status: DC

## 2021-02-09 MED ORDER — APIXABAN 5 MG PO TABS
5.0000 mg | ORAL_TABLET | Freq: Two times a day (BID) | ORAL | Status: DC
  Administered 2021-02-09 – 2021-02-13 (×8): 5 mg via ORAL
  Filled 2021-02-09 (×9): qty 1

## 2021-02-09 MED ORDER — MAGNESIUM SULFATE 2 GM/50ML IV SOLN
2.0000 g | Freq: Once | INTRAVENOUS | Status: AC
  Administered 2021-02-09: 2 g via INTRAVENOUS
  Filled 2021-02-09: qty 50

## 2021-02-09 MED ORDER — ONDANSETRON HCL 4 MG PO TABS: 4.0000 mg | ORAL_TABLET | Freq: Four times a day (QID) | ORAL | Status: DC | PRN

## 2021-02-09 MED ORDER — PREDNISONE 20 MG PO TABS: 40.0000 mg | ORAL_TABLET | Freq: Every day | ORAL | Status: DC

## 2021-02-09 NOTE — ED Provider Notes (Signed)
[facility] DEPT Provider Note   CSN: [id number] Arrival date & time: 02/09/21  1011     History Chief Complaint  Patient presents with   Shortness of Breath    Joe [name] is a 75 y.o. male.   Shortness of Breath Associated symptoms: cough   Associated symptoms: no abdominal pain, no chest pain and no rash   Patient presents with shortness of breath.  Has had for the last few days.  Has a history of COPD.  Former smoker.  Also has a history of stage III lung cancer.  On treatment.  Last treatment was around 3 weeks ago.  Has worsening shortness of breath.  No fevers.  Occasional coughing.  States his oxygen has dropped down.  States even before this episode it would be down in the 80s with him walking into the office for oncology.  States it is gotten worse at home.  States will go down into the 70s at times also.  Not having fevers.  Unable to do same activity he was before.  Given treatments and steroids by EMS.    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- no symtoms   CLL (chronic lymphocytic leukemia) ([place]) 08/08/2014   06-29-2014 Dr Burr Medico Stage 0   COPD mixed type ([place]) 07/26/2007   Smoker - stopped 6/14    De Quervain's tenosynovitis, right January 12, 2014   28-Jun-2013    Depression    at times   Dysrhythmia    PAF   Dysuria 01/12/2014   9/15 - poss stricture Urol ref was offered    Gallstones 11/16/2017   Asymptomatic Pt refused surg ref   Generalized anxiety disorder 09/07/2012   Chronic   Potential benefits of a long term steroid  use as well as potential risks  and complications were explained to the patient and were aknowledged.      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   PERIORBITAL CELLULITIS 02/22/2009   Qualifier: Diagnosis of  By: Diona Browner MD, Amy     Retinal detachment    L>>R    Patient Active Problem List   Diagnosis Date Noted   Acute respiratory failure with hypoxia ([place]) 02/09/2021   Penile edema 12/24/2020   Onychomycosis 12/24/2020   Encounter for antineoplastic immunotherapy 02/12/2020   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   Goals of care, counseling/discussion 11/29/2019   Encounter for antineoplastic chemotherapy 11/29/2019   Healthcare maintenance 11/27/2019   Pneumothorax, left 11/20/2019   Pneumothorax 11/20/2019   Mediastinal lymphadenopathy 10/23/2019   Mass of upper lobe of left lung 10/15/2019   Diarrhea 08/01/2019   Upper respiratory infection 08/01/2019   OSA on CPAP  Aortic valve insufficiency 05/23/2019   OSA (obstructive sleep apnea) 03/27/2019   Paroxysmal atrial fibrillation ([place]) 02/06/2019   Secondary hypercoagulable state ([place]) 02/06/2019   Gait disorder 11/23/2018   Weight gain 05/23/2018   Gallstones 11/16/2017   Shoulder pain 05/31/2017   Pain of cheek 05/31/2017   Left hand pain 05/31/2017   Grief 05/14/2016   Hypothyroidism 12/25/2014   CLL (chronic lymphocytic leukemia) ([place]) 08/08/2014   DOE (dyspnea on exertion) 03/26/2014   Arthralgia 12/13/2013   Dysuria 12/13/2013   Gynecomastia 12/13/2013   De Quervain's tenosynovitis, right 12/13/2013   Palpitations 03/27/2013   Night sweats 03/12/2013   Neoplasm of uncertain behavior of skin 03/12/2013   Atrial flutter ([place]) 03/12/2013   Generalized anxiety disorder 09/07/2012   Intertrigo 02/02/2012   Well adult exam 08/24/2010    Physical deconditioning 08/24/2010   CONJUNCTIVITIS, BACTERIAL, ACUTE 02/22/2009   PERIORBITAL CELLULITIS 02/22/2009   TOBACCO USE DISORDER/SMOKER-SMOKING CESSATION DISCUSSED 07/26/2007   Essential hypertension 07/26/2007   COPD mixed type ([place]) 07/26/2007   Dyslipidemia 12/02/2006   GERD 12/02/2006    Past Surgical History:  Procedure Laterality Date   BICEPS TENDON REPAIR     BRONCHIAL BIOPSY  10/23/2019   Procedure: BRONCHIAL BIOPSIES;  Surgeon: [name], MD;  Location: [facility] ENDOSCOPY;  Service: Pulmonary;;   BRONCHIAL BIOPSY  11/20/2019   Procedure: BRONCHIAL BIOPSIES;  Surgeon: [name], MD;  Location: [facility] ENDOSCOPY;  Service: Pulmonary;;   BRONCHIAL BRUSHINGS  10/23/2019   Procedure: BRONCHIAL BRUSHINGS;  Surgeon: [name], MD;  Location: [facility] ENDOSCOPY;  Service: Pulmonary;;   BRONCHIAL BRUSHINGS  11/20/2019   Procedure: BRONCHIAL BRUSHINGS;  Surgeon: [name], MD;  Location: [facility] ENDOSCOPY;  Service: Pulmonary;;   BRONCHIAL NEEDLE ASPIRATION BIOPSY  10/23/2019   Procedure: BRONCHIAL NEEDLE ASPIRATION BIOPSIES;  Surgeon: [name], MD;  Location: [facility] ENDOSCOPY;  Service: Pulmonary;;   BRONCHIAL NEEDLE ASPIRATION BIOPSY  11/20/2019   Procedure: BRONCHIAL NEEDLE ASPIRATION BIOPSIES;  Surgeon: [name], MD;  Location: [facility] ENDOSCOPY;  Service: Pulmonary;;   BRONCHIAL WASHINGS  11/20/2019   Procedure: BRONCHIAL WASHINGS;  Surgeon: [name], MD;  Location: [facility];  Service: Pulmonary;;   CATARACT EXTRACTION Left    COLONOSCOPY  04-14-99   Dr Flossie Dibble polyp-TA in epic   POLYPECTOMY  04-14-99   POSTERIOR LAMINECTOMY / DECOMPRESSION CERVICAL SPINE     Dr Saintclair Halsted   RETINAL DETACHMENT SURGERY     left eye, 2007 x2, 2008 x 3   ROTATOR CUFF REPAIR  2004   right   TONSILLECTOMY  9833,8250   VIDEO BRONCHOSCOPY WITH ENDOBRONCHIAL NAVIGATION N/A 10/23/2019   Procedure: VIDEO BRONCHOSCOPY WITH ENDOBRONCHIAL NAVIGATION;  Surgeon: [name], MD;   Location: [facility] ENDOSCOPY;  Service: Pulmonary;  Laterality: N/A;   VIDEO BRONCHOSCOPY WITH ENDOBRONCHIAL NAVIGATION N/A 11/20/2019   Procedure: VIDEO BRONCHOSCOPY WITH ENDOBRONCHIAL NAVIGATION;  Surgeon: [name], MD;  Location: [facility] ENDOSCOPY;  Service: Pulmonary;  Laterality: N/A;   VIDEO BRONCHOSCOPY WITH ENDOBRONCHIAL ULTRASOUND N/A 10/23/2019   Procedure: VIDEO BRONCHOSCOPY WITH ENDOBRONCHIAL ULTRASOUND;  Surgeon: [name], MD;  Location: [facility] ENDOSCOPY;  Service: Pulmonary;  Laterality: N/A;       Family History  Problem Relation Age of Onset   COPD Mother    Diabetes Father    Coronary artery disease Other    Breast cancer Paternal Aunt 53   Colon cancer Neg Hx     Social History   Tobacco Use  Smoking status: Former    Packs/day: 0.80    Years: 50.00    Pack years: 40.00    Types: Cigarettes    Quit date: 08/27/2012    Years since quitting: 8.4   Smokeless tobacco: Never  Vaping Use   Vaping Use: Never used  Substance Use Topics   Alcohol use: Not Currently   Drug use: No    Home Medications Prior to Admission medications   Medication Sig Start Date End Date Taking? Authorizing Provider  amLODipine (NORVASC) 5 MG tablet TAKE 1 TABLET BY MOUTH EVERY DAY 10/27/20   [name], [name], MD  atenolol (TENORMIN) 25 MG tablet TAKE 1 TABLET BY MOUTH DAILY AS NEEDED FOR PALPITATIONS Patient not taking: No sig reported 08/15/20   [name], [name], MD  Cholecalciferol 25 MCG (1000 UT) tablet Take 1,000 Units by mouth daily.    [provider]  clotrimazole-betamethasone (LOTRISONE) cream Apply topically 2 (two) times daily. 12/24/20   [name], [name], MD  COVID-19 mRNA bivalent vaccine, Pfizer, (PFIZER COVID-19 VAC BIVALENT) injection Inject into the muscle. 01/13/21   Carlyle Basques, MD  diazepam (VALIUM) 5 MG tablet TAKE 1 TABLET(5 MG) BY MOUTH EVERY 12 HOURS Patient not taking: No sig reported 05/30/20   [name], [name], MD  docusate sodium  (COLACE) 100 MG capsule Take 100 mg by mouth 2 (two) times daily.    [provider]  ELIQUIS 5 MG TABS tablet TAKE 1 TABLET(5 MG) BY MOUTH TWICE DAILY 12/11/20   Sueanne Margarita, MD  levothyroxine (SYNTHROID) 112 MCG tablet TAKE 1 TABLET(112 MCG) BY MOUTH DAILY 11/27/20   Heilingoetter, Cassandra L, PA-C  lovastatin (MEVACOR) 20 MG tablet TAKE 1 TABLET BY MOUTH EVERY NIGHT AT BEDTIME 10/27/20   [name], [name], MD  methylPREDNISolone (MEDROL DOSEPAK) 4 MG TBPK tablet As directed Patient not taking: Reported on 01/19/2021 12/24/20   [name], [name], MD  Multiple Vitamin (MULTIVITAMIN) tablet Take 1 tablet by mouth daily. Centrum Silver.    [provider]  Polyethyl Glycol-Propyl Glycol (SYSTANE) 0.4-0.3 % SOLN Place 1 drop into both eyes daily as needed (Dry eyes). Ultra    [provider]  prochlorperazine (COMPAZINE) 10 MG tablet Take 1 tablet (10 mg total) by mouth every 6 (six) hours as needed. 11/29/19   Heilingoetter, Cassandra L, PA-C    Allergies    Patient has no known allergies.  Review of Systems   Review of Systems  Constitutional:  Negative for appetite change.  HENT:  Negative for congestion.   Respiratory:  Positive for cough and shortness of breath.   Cardiovascular:  Negative for chest pain.  Gastrointestinal:  Negative for abdominal pain.  Genitourinary:  Negative for genital sores and hematuria.  Musculoskeletal:  Negative for back pain.  Skin:  Negative for rash.  Neurological:  Negative for numbness.  Psychiatric/Behavioral:  Negative for confusion.    Physical Exam Updated Vital Signs BP 113/64   Pulse 79   Temp 99.1 F (37.3 C)   Resp 15   Ht 5\' 11"  (1.803 m)   Wt 88.5 kg   SpO2 92%   BMI 27.21 kg/m   Physical Exam Vitals and nursing note reviewed.  HENT:     Head: Atraumatic.  Cardiovascular:     Rate and Rhythm: Normal rate and regular rhythm.  Pulmonary:     Breath sounds: Wheezing present.  Chest:     Chest  wall: No tenderness.  Musculoskeletal:     Right  lower leg: No edema.     Left lower leg: No edema.  Skin:    General: Skin is warm.     Capillary Refill: Capillary refill takes less than 2 seconds.  Neurological:     Mental Status: He is alert and oriented to person, place, and time.    ED Results / Procedures / Treatments   Labs (all labs ordered are listed, but only abnormal results are displayed) Labs Reviewed  COMPREHENSIVE METABOLIC PANEL - Abnormal; Notable for the following components:      Result Value   Sodium 134 (*)    Chloride 96 (*)    Glucose, Bld 126 (*)    Calcium 8.8 (*)    AST 48 (*)    Total Bilirubin 1.5 (*)    All other components within normal limits  CBC WITH DIFFERENTIAL/PLATELET - Abnormal; Notable for the following components:   WBC 27.6 (*)    Neutro Abs 22.1 (*)    Monocytes Absolute 2.4 (*)    Abs Immature Granulocytes 0.27 (*)    All other components within normal limits  RESP PANEL BY RT-PCR (FLU A&B, COVID) [id number]    EKG EKG Interpretation  Date/Time:  Monday February 09 2021 10:49:00 EST Ventricular Rate:  115 PR Interval:  165 QRS Duration: 96 QT Interval:  368 QTC Calculation: 422 R Axis:   63 Text Interpretation: Sinus tachycardia Supraventricular bigeminy Abnormal R-wave progression, early transition bigeminy is new Confirmed by Davonna Belling [phone]) on 02/09/2021 11:05:41 AM  Radiology CT Angio Chest PE W and/or Wo Contrast  Result Date: 02/09/2021 CLINICAL DATA:  Worsening shortness of breath, low O2 sats, lung cancer staging. Chemo radiation completed. Last immunotherapy 3 weeks ago. EXAM: CT ANGIOGRAPHY CHEST WITH CONTRAST TECHNIQUE: Multidetector CT imaging of the chest was performed using the standard protocol during bolus administration of intravenous contrast. Multiplanar CT image reconstructions and MIPs were obtained to evaluate the vascular anatomy. CONTRAST:  18mL OMNIPAQUE IOHEXOL 350 MG/ML SOLN COMPARISON:   10/24/2020. FINDINGS: Cardiovascular: Negative for pulmonary embolus. Atherosclerotic calcification of the aorta, aortic valve and coronary arteries. Ascending aorta measures up to 4.1 cm (coronal series 7, image 53). Heart is at the upper limits of normal in size. No pericardial effusion. Mediastinum/Nodes: Mediastinal and hilar lymph nodes are not enlarged by CT size criteria. No axillary adenopathy. Esophagus is grossly unremarkable. Lungs/Pleura: Centrilobular emphysema. Post treatment collapse/consolidation in the left upper lobe, unchanged. Increasing subpleural nodularity and ground-glass in the mid and lower lung zones. No pleural fluid. Airway is unremarkable. Upper Abdomen: Visualized portion of the liver is decreased in attenuation diffusely. Stones in the gallbladder. Visualized portions of the adrenal glands, kidneys, spleen, pancreas, stomach and bowel are grossly unremarkable. Periportal lymph nodes measure up to 1.8 cm (4/160) and are likely reactive. Musculoskeletal: Degenerative changes in the spine. No worrisome lytic or sclerotic lesions. Old left rib fractures. Review of the MIP images confirms the above findings. IMPRESSION: 1. Negative for pulmonary embolus. 2. 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 3. Post treatment collapse/consolidation in the left upper lobe, stable. No evidence of progressive disease. 4. Hepatic steatosis. 5. Cholelithiasis. 6. Ascending aortic aneurysm, stable. Recommend annual imaging followup by CTA or MRA. This recommendation follows 2010 ACCF/AHA/AATS/ACR/ASA/SCA/SCAI/SIR/STS/SVM Guidelines for the Diagnosis and Management of Patients with Thoracic Aortic Disease. Circulation. 2010; 121: H062-B762. Aortic aneurysm NOS (ICD10-I71.9). 7. Aortic atherosclerosis (ICD10-I70.0). Coronary artery calcification. 8.  Emphysema (ICD10-J43.9). Electronically  Signed  By: Lorin Picket M.D.   On: 02/09/2021 12:50   DG Chest Portable 1 View  Result Date: 02/09/2021 CLINICAL DATA:  Shortness of breath EXAM: PORTABLE CHEST 1 VIEW COMPARISON:  Previous studies including the CT chest done on 10/24/2020 FINDINGS: Cardiac size is within normal limits. There is decreased volume in the left lung. Low position of diaphragms may suggest COPD. Small patchy infiltrate is seen in the left apex. Overall, no significant interval changes are noted in the infiltrate in the left upper lung fields in comparison with the CT done on 10/24/2020. Rest of the lung fields are clear. There is no significant pleural effusion or pneumothorax. IMPRESSION: Small patchy infiltrate is seen in left upper lung fields with no significant interval change. There are no signs of alveolar pulmonary edema or new focal infiltrates. Electronically Signed   By: Elmer Picker M.D.   On: 02/09/2021 11:22    Procedures Procedures   Medications Ordered in ED Medications  iohexol (OMNIPAQUE) 350 MG/ML injection 75 mL (75 mLs Intravenous Contrast Given 02/09/21 1219)    ED Course  I have reviewed the triage vital signs and the nursing notes.  Pertinent labs & imaging results that were available during my care of the patient were reviewed by me and considered in my medical decision making (see chart for details).    MDM Rules/Calculators/A&P                           Patient shortness of breath.  Has been worsening recently over the last couple weeks but worse last few days.  Has stage III lung cancer on treatment and also COPD.  States he has hypoxia with ambulation has been going for a while but worse recently.  States his sats will sometimes go down to in the 70s at home.  Chest x-ray overall reassuring, however CT scan done dose for staging and evaluate for pulm embolism.  Showed likely an organizing pneumonia.  White count is elevated above the baseline.  History of CLL also.  Has  worsening hypoxia with ambulation.  Will admit to hospitalist.  Negative COVID test. Final Clinical Impression(s) / ED Diagnoses Final diagnoses:  Organizing pneumonia [facility])    Rx / DC Orders ED Discharge Orders     None        Davonna Belling, MD 02/09/21 1356

## 2021-02-09 NOTE — ED Notes (Signed)
The patient is alert and oriented x 4 and uses 2l [place] for oxygen. Patient uses the urinal to void.

## 2021-02-09 NOTE — Plan of Care (Signed)
The patient is admitted to 4 W 1431. A & O x 4. Denied any acute pain. No respiratory distress noted. The patient is resting quietly at this time with no issues. Will continue to monitor.

## 2021-02-09 NOTE — H&P (Signed)
History and Physical    Joe [name]:[id number] DOB: [birth year] DOA: 02/09/2021  PCP: Cassandria Anger, MD   Patient coming from: Home.   I have personally briefly reviewed patient's old medical records in [place]  Chief Complaint: Shortness of breath.  HPI: Joe [name] is a 75 y.o. male with medical history significant of alcohol abuse, hydrologist, cataract, CLL, mixed type COPD, de Quervain's tenosynovitis, depression/GAD, paroxysmal atrial fibrillation on apixaban, gallstones, GERD, gynecomastia, hyperlipidemia, hypertension, hypothyroid [name], intertrigo, OSA on CPAP, history of periorbital cellulitis, history of retinal detachment who is coming to the emergency department due to progressively worse dyspnea and fatigue since Wednesday.  He has noticed his oxygen saturation readings have been in the 80s at home.  Today he was very dyspneic and his saturations went down to the 70s, Solu-Medrol 125 mg and then 10 mg albuterol continuous neb.  He has had low-grade fever, but no chills, rhinorrhea.  He has a mild sore throat at times from coughing.  He is unable to expectorate phlegm.  Denied chest pain, diaphoresis, PND, orthopnea or pitting edema of the lower extremities.  He has palpitations on occasion and takes atenolol as needed.  No abdominal pain, diarrhea, constipation, melena or hematochezia.  No dysuria, frequency or hematuria.  No polyuria, polydipsia, polyphagia or blurred vision.  ED Course: Initial vital signs were temperature 99.1 F, pulse 94, respiration 20, BP 122/59 mmHg O2 sat 94%.  Lab work: His CBC showed a white count of 27.6 with 80% neutrophils, hemoglobin 14.5 g/dL platelets 366.  Influenza and coronavirus PCR was negative.  CMP with a sodium 134 and chloride 96 mmol/L.  Renal function was normal.  Glucose 126 mg/dL.  AST was 48 units/L and total bilirubin 1.5 mg/dL.  The rest of the LFTs were normal.  Imaging: A 1 view chest radiograph showed  small patchy infiltrate in left upper lung field with no significant interval change.  CTA chest was negative for PE.  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  There is posttreatment collapse/consolidation in the left upper lobe.  No evidence of progressive disease.  Hepatic steatosis.  Cholelithiasis.  Ascending aortic aneurysm which is stable.  Please see images and full radiology report for further details.  Review of Systems: As per HPI otherwise all other systems reviewed and are negative.  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   chronic stage 1- no symtoms   CLL (chronic lymphocytic leukemia) ([place]) 08/08/2014   2016 Dr Burr Medico Stage 0   COPD mixed type ([place]) 07/26/2007   Smoker - stopped 6/14    De Quervain's tenosynovitis, right 12/13/2013   2015    Depression    at times   Dysrhythmia    PAF   Dysuria 12/13/2013   9/15 - poss stricture Urol ref was offered    Gallstones 11/16/2017   Asymptomatic Pt refused surg ref   Generalized anxiety disorder 09/07/2012   Chronic   Potential benefits of a long term steroid  use as well as potential risks  and complications were explained to the patient and were aknowledged.      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   PERIORBITAL CELLULITIS 02/22/2009   Qualifier:  Diagnosis of  By: Diona Browner MD, Amy     Retinal detachment    L>>R   Past Surgical History:  Procedure Laterality Date   BICEPS TENDON REPAIR     BRONCHIAL BIOPSY  10/23/2019   Procedure: BRONCHIAL BIOPSIES;  Surgeon: [name], MD;  Location: [facility] ENDOSCOPY;  Service: Pulmonary;;   BRONCHIAL BIOPSY  11/20/2019   Procedure: BRONCHIAL BIOPSIES;  Surgeon: [name], MD;  Location: [facility] ENDOSCOPY;  Service: Pulmonary;;   BRONCHIAL BRUSHINGS  10/23/2019   Procedure: BRONCHIAL BRUSHINGS;  Surgeon: [name], MD;  Location: [facility] ENDOSCOPY;  Service: Pulmonary;;   BRONCHIAL BRUSHINGS  11/20/2019   Procedure: BRONCHIAL BRUSHINGS;  Surgeon: [name], MD;  Location: [facility] ENDOSCOPY;  Service: Pulmonary;;   BRONCHIAL NEEDLE ASPIRATION BIOPSY  10/23/2019   Procedure: BRONCHIAL NEEDLE ASPIRATION BIOPSIES;  Surgeon: [name], MD;  Location: [facility] ENDOSCOPY;  Service: Pulmonary;;   BRONCHIAL NEEDLE ASPIRATION BIOPSY  11/20/2019   Procedure: BRONCHIAL NEEDLE ASPIRATION BIOPSIES;  Surgeon: [name], MD;  Location: [facility] ENDOSCOPY;  Service: Pulmonary;;   BRONCHIAL WASHINGS  11/20/2019   Procedure: BRONCHIAL WASHINGS;  Surgeon: [name], MD;  Location: [place];  Service: Pulmonary;;   CATARACT EXTRACTION Left    COLONOSCOPY  04-14-99   Dr Flossie Dibble polyp-TA in epic   POLYPECTOMY  04-14-99   POSTERIOR LAMINECTOMY / DECOMPRESSION CERVICAL SPINE     Dr Saintclair Halsted   RETINAL DETACHMENT SURGERY     left eye, 2007 x2, 2008 x 3   ROTATOR CUFF REPAIR  2004   right   TONSILLECTOMY  [id number]   VIDEO BRONCHOSCOPY WITH ENDOBRONCHIAL NAVIGATION N/A 10/23/2019   Procedure: [place];  Surgeon: [name], MD;  Location: [facility] ENDOSCOPY;  Service: Pulmonary;  Laterality: N/A;   VIDEO BRONCHOSCOPY WITH ENDOBRONCHIAL NAVIGATION N/A 11/20/2019   Procedure: VIDEO BRONCHOSCOPY WITH ENDOBRONCHIAL NAVIGATION;  Surgeon: [name], MD;  Location: [facility] ENDOSCOPY;   Service: Pulmonary;  Laterality: N/A;   VIDEO BRONCHOSCOPY WITH ENDOBRONCHIAL ULTRASOUND N/A 10/23/2019   Procedure: VIDEO BRONCHOSCOPY WITH ENDOBRONCHIAL ULTRASOUND;  Surgeon: [name], MD;  Location: [facility] ENDOSCOPY;  Service: Pulmonary;  Laterality: N/A;   Social History  reports that he quit smoking about 8 years ago. His smoking use included cigarettes. He has a 40.00 pack-year smoking history. He has never used smokeless tobacco. He reports that he does not currently use alcohol. He reports that he does not use drugs.  No Known Allergies  Family History  Problem Relation Age of Onset   COPD Mother    Diabetes Father    Coronary artery disease Other    Breast cancer Paternal Aunt 23   Colon cancer Neg Hx    Prior to Admission medications   Medication Sig Start Date End Date Taking? Authorizing Provider  amLODipine (NORVASC) 5 MG tablet TAKE 1 TABLET BY MOUTH EVERY DAY Patient taking differently: Take 5  mg by mouth every evening. 10/27/20  Yes [name], [name], MD  atenolol (TENORMIN) 25 MG tablet TAKE 1 TABLET BY MOUTH DAILY AS NEEDED FOR PALPITATIONS Patient taking differently: Take 25 mg by mouth daily as needed (palpitations). 08/15/20  Yes [name], [name], MD  Cholecalciferol 25 MCG (1000 UT) tablet Take 1,000 Units by mouth daily.   Yes [provider]  diazepam (VALIUM) 5 MG tablet TAKE 1 TABLET(5 MG) BY MOUTH EVERY 12 HOURS Patient taking differently: Take 5 mg by mouth every 12 (twelve) hours as needed for anxiety. 05/30/20  Yes [name], [name], MD  ELIQUIS 5 MG TABS tablet TAKE 1 TABLET(5 MG) BY MOUTH TWICE DAILY Patient taking differently: Take 5 mg by mouth 2 (two) times daily. 12/11/20  Yes Turner, Eber Hong, MD  levothyroxine (SYNTHROID) 112 MCG tablet TAKE 1 TABLET(112 MCG) BY MOUTH DAILY Patient taking differently: Take 112 mcg by mouth daily before breakfast. 11/27/20  Yes Heilingoetter, Cassandra L, PA-C  lovastatin (MEVACOR) 20 MG tablet TAKE 1  TABLET BY MOUTH EVERY NIGHT AT BEDTIME Patient taking differently: Take 20 mg by mouth at bedtime. 10/27/20  Yes [name], [name], MD  Multiple Vitamin (MULTIVITAMIN) tablet Take 1 tablet by mouth daily. Centrum Silver.   Yes [provider]  Polyethyl Glycol-Propyl Glycol (SYSTANE) 0.4-0.3 % SOLN Place 1 drop into both eyes daily as needed (Dry eyes). Ultra   Yes [provider]  prochlorperazine (COMPAZINE) 10 MG tablet Take 1 tablet (10 mg total) by mouth every 6 (six) hours as needed. 11/29/19  Yes Heilingoetter, Cassandra L, PA-C  clotrimazole-betamethasone (LOTRISONE) cream Apply topically 2 (two) times daily. 12/24/20   [name], [name], MD  COVID-19 mRNA bivalent vaccine, Pfizer, (PFIZER COVID-19 VAC BIVALENT) injection Inject into the muscle. 01/13/21   Carlyle Basques, MD  methylPREDNISolone (MEDROL DOSEPAK) 4 MG TBPK tablet As directed Patient not taking: Reported on 01/19/2021 12/24/20   [name], [name], MD   Physical Exam: Vitals:   02/09/21 1102 02/09/21 1115 02/09/21 1200 02/09/21 1315  BP:  120/62 112/77 113/64  Pulse:  (!) 44 82 79  Resp: 20 17 18 15   Temp: 99.1 F (37.3 C)     SpO2:  93% 91% 92%  Weight:      Height:       Constitutional: Chronically ill-appearing.  NAD, calm, comfortable. Eyes: PERRL, lids and conjunctivae normal.  Mildly injected sclera. ENMT: Nasal cannula in place.  Mucous membranes are moist. Posterior pharynx clear of any exudate or lesions. Neck: normal, supple, no masses, no thyromegaly Respiratory: Decreased breath sounds bilaterally more pronounced in RLL, no wheezing, few scattered crackles. Normal respiratory effort. No accessory muscle use.  Cardiovascular: Regular rate and rhythm, no murmurs / rubs / gallops. No extremity edema. 2+ pedal pulses. No carotid bruits.  Abdomen: Obese, no distention.  Bowel sounds positive.  Soft, no tenderness, no masses palpated. No hepatosplenomegaly.  Musculoskeletal: Mild  generalized weakness.  No clubbing / cyanosis. Good ROM, no contractures. Normal muscle tone.  Skin: no rashes, lesions, ulcers on limited dermatological examination. Neurologic: CN 2-12 grossly intact. Sensation intact, DTR normal. Strength 5/5 in all 4.  Psychiatric: Normal judgment and insight. Alert and oriented x 3. Normal mood.   Labs on Admission: I have personally reviewed following labs and imaging studies  CBC: Recent Labs  Lab 02/09/21 1026  WBC 27.6*  NEUTROABS 22.1*  HGB 14.5  HCT 45.5  MCV 91.2  PLT 638    Basic Metabolic Panel:  Recent Labs  Lab 02/09/21 1026  NA 134*  K 3.6  CL 96*  CO2 25  GLUCOSE 126*  BUN 17  CREATININE 0.85  CALCIUM 8.8*    GFR: Estimated Creatinine Clearance: 81.2 mL/min (by C-G formula based on SCr of 0.85 mg/dL).  Liver Function Tests: Recent Labs  Lab 02/09/21 1026  AST 48*  ALT 31  ALKPHOS 89  BILITOT 1.5*  PROT 7.8  ALBUMIN 3.5   Radiological Exams on Admission: CT Angio Chest PE W and/or Wo Contrast  Result Date: 02/09/2021 CLINICAL DATA:  Worsening shortness of breath, low O2 sats, lung cancer staging. Chemo radiation completed. Last immunotherapy 3 weeks ago. EXAM: CT ANGIOGRAPHY CHEST WITH CONTRAST TECHNIQUE: Multidetector CT imaging of the chest was performed using the standard protocol during bolus administration of intravenous contrast. Multiplanar CT image reconstructions and MIPs were obtained to evaluate the vascular anatomy. CONTRAST:  14mL OMNIPAQUE IOHEXOL 350 MG/ML SOLN COMPARISON:  10/24/2020. FINDINGS: Cardiovascular: Negative for pulmonary embolus. Atherosclerotic calcification of the aorta, aortic valve and coronary arteries. Ascending aorta measures up to 4.1 cm (coronal series 7, image 53). Heart is at the upper limits of normal in size. No pericardial effusion. Mediastinum/Nodes: Mediastinal and hilar lymph nodes are not enlarged by CT size criteria. No axillary adenopathy. Esophagus is grossly  unremarkable. Lungs/Pleura: Centrilobular emphysema. Post treatment collapse/consolidation in the left upper lobe, unchanged. Increasing subpleural nodularity and ground-glass in the mid and lower lung zones. No pleural fluid. Airway is unremarkable. Upper Abdomen: Visualized portion of the liver is decreased in attenuation diffusely. Stones in the gallbladder. Visualized portions of the adrenal glands, kidneys, spleen, pancreas, stomach and bowel are grossly unremarkable. Periportal lymph nodes measure up to 1.8 cm (4/160) and are likely reactive. Musculoskeletal: Degenerative changes in the spine. No worrisome lytic or sclerotic lesions. Old left rib fractures. Review of the MIP images confirms the above findings. IMPRESSION: 1. Negative for pulmonary embolus. 2. 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 3. Post treatment collapse/consolidation in the left upper lobe, stable. No evidence of progressive disease. 4. Hepatic steatosis. 5. Cholelithiasis. 6. Ascending aortic aneurysm, stable. Recommend annual imaging followup by CTA or MRA. This recommendation follows 2010 ACCF/AHA/AATS/ACR/ASA/SCA/SCAI/SIR/STS/SVM Guidelines for the Diagnosis and Management of Patients with Thoracic Aortic Disease. Circulation. 2010; 121: E993-Z169. Aortic aneurysm NOS (ICD10-I71.9). 7. Aortic atherosclerosis (ICD10-I70.0). Coronary artery calcification. 8.  Emphysema (ICD10-J43.9). Electronically Signed   By: Lorin Picket M.D.   On: 02/09/2021 12:50   DG Chest Portable 1 View  Result Date: 02/09/2021 CLINICAL DATA:  Shortness of breath EXAM: PORTABLE CHEST 1 VIEW COMPARISON:  Previous studies including the CT chest done on 10/24/2020 FINDINGS: Cardiac size is within normal limits. There is decreased volume in the left lung. Low position of diaphragms may suggest COPD. Small patchy infiltrate is seen in the left  apex. Overall, no significant interval changes are noted in the infiltrate in the left upper lung fields in comparison with the CT done on 10/24/2020. Rest of the lung fields are clear. There is no significant pleural effusion or pneumothorax. IMPRESSION: Small patchy infiltrate is seen in left upper lung fields with no significant interval change. There are no signs of alveolar pulmonary edema or new focal infiltrates. Electronically Signed   By: Elmer Picker M.D.   On: 02/09/2021 11:22    02/21/2020 echocardiogram IMPRESSIONS:  1. Left ventricular ejection fraction, by estimation, is 60 to 65%. Left  ventricular ejection  fraction by 3D volume is 59 %. The left ventricle has  normal function. The left ventricle has no regional wall motion  abnormalities. Left ventricular diastolic   parameters were normal. The average left ventricular global longitudinal  strain is -19.8 %. The global longitudinal strain is normal.   2. Right ventricular systolic function is normal. The right ventricular  size is normal. Tricuspid regurgitation signal is inadequate for assessing  PA pressure.   3. The mitral valve is grossly normal. No evidence of mitral valve  regurgitation. No evidence of mitral stenosis.   4. The aortic valve is tricuspid. There is mild calcification of the  aortic valve. There is mild thickening of the aortic valve. Aortic valve  regurgitation is mild to moderate. No aortic stenosis is present. Aortic  regurgitation PHT measures 498 msec.   5. The inferior vena cava is normal in size with greater than 50%  respiratory variability, suggesting right atrial pressure of 3 mmHg.   EKG: Independently reviewed.  Vent. rate 115 BPM PR interval 165 ms QRS duration 96 ms QT/QTcB 368/422 ms P-R-T axes 86 63 72 Sinus tachycardia Supraventricular bigeminy Abnormal R-wave progression, early transition  Assessment/Plan Principal Problem:   Acute respiratory failure with hypoxia  (HCC) In the setting of   Organizing pneumonia ([place]) Observation/PCU. Continue supplemental oxygen. Check procalcitonin level. Begin prednisone 60 mg daily. Taper down by 10 mg every week. Azithromycin 500 mg IVPB daily. Ceftriaxone 1 g IVPB daily. PCCM consult greatly appreciated.  Active Problems:   COPD mixed type ([place]) As above. Does not like to use bronchodilators.    Paroxysmal atrial fibrillation (HCC) CHA?DS?-VASc Score of at least 3. Continue apixaban 5 mg p.o. twice daily. Continue atenolol as needed for palpitation or tachycardia.    Dyslipidemia Continue pravastatin 20 mg p.o. every evening.    Essential hypertension Continue amlodipine 5 mg p.o. daily.    CLL (chronic lymphocytic leukemia) (HCC)   Stage III squamous cell carcinoma of left lung [facility]) Oncology informed of the patient's admission. Follow-up after scheduled with Dr. Earlie Server.    Hypothyroidism Continue levothyroxine 112 mcg p.o. daily.    OSA on CPAP Continue CPAP at bedtime.    DVT prophylaxis: Lovenox SQ. Code Status:   Full code. Family Communication:   Disposition Plan:   Patient is from:  Home.  Anticipated DC to:  Home.  Anticipated DC date:  02/11/2021.  Anticipated DC barriers: Clinical status.  Consults called:  Leslye Peer, MD (PCCM). Admission status:  Observation/PCU.   Severity of Illness: High severity after presenting with acute respiratory failure with hypoxia in the setting of  organizing pneumonia in the setting of immunotherapy for stage III lung cancer.  Reubin Milan MD [facility]  How to contact the [facility] Attending or Consulting provider [place] or covering provider during after hours [place], for this patient?   Check the care team in [facility] and look for a) attending/consulting [facility] provider listed and b) the [facility] team listed Log into [email or web address] and use [place]'s universal password to access. If you do not have the password, please contact the  hospital operator. Locate the [facility] provider you are looking for under [facility] and page to a number that you can be directly reached. If you still have difficulty reaching the provider, please page the [facility] (Director on Call) for the Hospitalists listed on amion for assistance.  02/09/2021, 3:09 PM   This document was prepared using Systems analyst and  may contain some unintended transcription errors.

## 2021-02-09 NOTE — ED Triage Notes (Signed)
Pt BIB [facility] from home c/o SOB since Wednesday. Pt states that his o2 sats have gradually dropped since Wednesday. Pt has hx of lung cancer. EMS placed 24g Iv in right FA and gave 125 solumedrol, and 10 of albuterol.

## 2021-02-09 NOTE — Progress Notes (Signed)
Patient at this time has declined the use of a CPAP and is currently on O2 for rest. Patient informed RT that he would let us know in the morning if he would like to use a unit tomorrow night.RT made patient aware that a unit would be on stand by if needed.

## 2021-02-09 NOTE — Consult Note (Signed)
NAME:  Joe [name], MRN:  [id number], DOB:  1945/12/11, LOS: 0 ADMISSION DATE:  02/09/2021, CONSULTATION DATE:  02/09/21 REFERRING MD:  Olevia Bowens, CHIEF COMPLAINT:  dyspnea   History of Present Illness:  75yM with stage IIIb NSCLC, squamous cell carcinoma who originally presented with LUL mass and mediastinal LAD, contralateral R hilar hypermetabolism wh ois s/p chemoXRT now on consolidation IT with imfinzi s/p 13 cycles, CLL dx 2016, COPD, pAF, hypothyroid, GERD, OSA on CPAP who presented to the ED today with worsening dyspnea over the last month but especially over the last several days, home pulse oximetry with saturation in 70s at times. He had cough starting last week, chest congestion but can't quite expectorate anything. Low grade fever over last week. He was given 125 mg IV solumedrol en route by EMS.   Pertinent  Medical History  Stage IIIb NSCLC s/p chemoXRT on consolidation IT with imfinzi CLL COPD pAF Hypothyroid GERD OSA on CPAP  Significant Hospital Events: Including procedures, antibiotic start and stop dates in addition to other pertinent events   02/09/21 admitted, given 125 mg IV solumedrol by EMS  Interim History / Subjective:    Objective   Blood pressure 113/64, pulse 79, temperature 99.1 F (37.3 C), resp. rate 15, height _0  (1.803 m), weight 88.5 kg, SpO2 92 %.       No intake or output data in the 24 hours ending 02/09/21 1444 Filed Weights   02/09/21 1038  Weight: 88.5 kg    Examination: General appearance: 75 y.o., male, NAD, conversant  Eyes: anicteric sclerae, ; PERRL, tracking appropriately HENT: NCAT; dry MM Neck: Trachea midline; no lymphadenopathy, no JVD Lungs: Diminished bilaterally, with normal respiratory effort CV: RRR, no MRGs  Abdomen: Soft, non-tender; non-distended, BS present  Extremities: No peripheral edema, radial and DP pulses present bilaterally  Skin: Normal temperature, turgor and texture; no rash Psych: Appropriate  affect Neuro: Alert and oriented to person and place, no focal deficit    Resolved Hospital Problem list     Assessment & Plan:   # Multifocal consolidative, nodular opacities # Acute hypoxic respiratory insufficiency # COPD # OSA  Organizing pneumonia whether primary or related to IT or infection, atypical/viral pneumonia, CAP, aspiration although pattern isn't characteristic are considerations. - sputum culture, AFB culture, BCx, 20 pathogen RVP - ceftriaxone/azithro - low utility to bronch/BAL for cell count/diff to look for pattern c/w organizing pneumonia or eosinophilic pna after steroid exposure - prednisone 60 mg daily starting tomorrow for 1 week and then taper by 10 mg each week, will ensure he has follow up with pulmonary before he completes taper. He was instructed to let us know if his symptoms worsen as steroid tapered, in which case he is to resume previous steroid dose that held symptoms at [place].  - will hold off on bronchodilators as it sounds like he's resistant to using these  - CPAP at night and with naps   Best Practice (right click and "Reselect all SmartList Selections" daily)   Per [facility]  Labs   CBC: Recent Labs  Lab 02/09/21 1026  WBC 27.6*  NEUTROABS 22.1*  HGB 14.5  HCT 45.5  MCV 91.2  PLT 935    Basic Metabolic Panel: Recent Labs  Lab 02/09/21 1026  NA 134*  K 3.6  CL 96*  CO2 25  GLUCOSE 126*  BUN 17  CREATININE 0.85  CALCIUM 8.8*   GFR: Estimated Creatinine Clearance: 81.2 mL/min (by C-G formula based on SCr  of 0.85 mg/dL). Recent Labs  Lab 02/09/21 1026  WBC 27.6*    Liver Function Tests: Recent Labs  Lab 02/09/21 1026  AST 48*  ALT 31  ALKPHOS 89  BILITOT 1.5*  PROT 7.8  ALBUMIN 3.5   No results for input(s): LIPASE, AMYLASE in the last 168 hours. No results for input(s): AMMONIA in the last 168 hours.  ABG No results found for: PHART, PCO2ART, PO2ART, HCO3, TCO2, ACIDBASEDEF, O2SAT   Coagulation Profile: No  results for input(s): INR, PROTIME in the last 168 hours.  Cardiac Enzymes: No results for input(s): CKTOTAL, CKMB, CKMBINDEX, TROPONINI in the last 168 hours.  HbA1C: No results found for: HGBA1C  CBG: No results for input(s): GLUCAP in the last 168 hours.  Review of Systems:   12 point review of systems is negative except as in HPI  Past Medical History:  He,  has a past medical history of Alcohol abuse, daily use (08/24/2010), Arthralgia (12/13/2013), Cataract, Chronic lymphocytic leukemia ([place]) (07-18-2014), CLL (chronic lymphocytic leukemia) ([place]) (08/08/2014), COPD mixed type ([place]) (07/26/2007), De Quervain's tenosynovitis, right (12/13/2013), Depression, Dysrhythmia, Dysuria (12/13/2013), Gallstones (11/16/2017), Generalized anxiety disorder (09/07/2012), GERD (12/02/2006), Grief (05/14/2016), Gynecomastia (12/13/2013), Hyperlipidemia, Hypertension, Hypothyroidism (12/25/2014), Intertrigo (02/02/2012), Neoplasm of uncertain behavior of skin (03/12/2013), OSA on CPAP, Paresis ([place]), PERIORBITAL CELLULITIS (02/22/2009), and Retinal detachment.   Surgical History:   Past Surgical History:  Procedure Laterality Date   BICEPS TENDON REPAIR     BRONCHIAL BIOPSY  10/23/2019   Procedure: BRONCHIAL BIOPSIES;  Surgeon: [name], MD;  Location: [facility] ENDOSCOPY;  Service: Pulmonary;;   BRONCHIAL BIOPSY  11/20/2019   Procedure: BRONCHIAL BIOPSIES;  Surgeon: [name], MD;  Location: [facility] ENDOSCOPY;  Service: Pulmonary;;   BRONCHIAL BRUSHINGS  10/23/2019   Procedure: BRONCHIAL BRUSHINGS;  Surgeon: [name], MD;  Location[facility] ENDOSCOPY;  Service: Pulmonary;;   BRONCHIAL BRUSHINGS  11/20/2019   Procedure: BRONCHIAL BRUSHINGS;  Surgeon: [name], MD;  Location[facility] ENDOSCOPY;  Service: Pulmonary;;   BRONCHIAL NEEDLE ASPIRATION BIOPSY  10/23/2019   Procedure: BRONCHIAL NEEDLE ASPIRATION BIOPSIES;  Surgeon: [name], MD;  Location: [facility] ENDOSCOPY;  Service: Pulmonary;;   BRONCHIAL NEEDLE  ASPIRATION BIOPSY  11/20/2019   Procedure: BRONCHIAL NEEDLE ASPIRATION BIOPSIES;  Surgeon: [name], MD;  Location: [facility] ENDOSCOPY;  Service: Pulmonary;;   BRONCHIAL WASHINGS  11/20/2019   Procedure: BRONCHIAL WASHINGS;  Surgeon: [name], MD;  Location: [place];  Service: Pulmonary;;   CATARACT EXTRACTION Left    COLONOSCOPY  04-14-99   Dr Flossie Dibble polyp-TA in epic   POLYPECTOMY  04-14-99   POSTERIOR LAMINECTOMY / DECOMPRESSION CERVICAL SPINE     Dr Saintclair Halsted   RETINAL DETACHMENT SURGERY     left eye, 2007 x2, 2008 x 3   ROTATOR CUFF REPAIR  2004   right   TONSILLECTOMY  4920,1007   VIDEO BRONCHOSCOPY WITH ENDOBRONCHIAL NAVIGATION N/A 10/23/2019   Procedure: VIDEO BRONCHOSCOPY WITH ENDOBRONCHIAL NAVIGATION;  Surgeon: [name], MD;  Location: [facility] ENDOSCOPY;  Service: Pulmonary;  Laterality: N/A;   VIDEO BRONCHOSCOPY WITH ENDOBRONCHIAL NAVIGATION N/A 11/20/2019   Procedure: VIDEO BRONCHOSCOPY WITH ENDOBRONCHIAL NAVIGATION;  Surgeon: [name], MD;  Location: [place] ENDOSCOPY;  Service: Pulmonary;  Laterality: N/A;   VIDEO BRONCHOSCOPY WITH ENDOBRONCHIAL ULTRASOUND N/A 10/23/2019   Procedure: VIDEO BRONCHOSCOPY WITH ENDOBRONCHIAL ULTRASOUND;  Surgeon: [name], MD;  Location: [facility] ENDOSCOPY;  Service: Pulmonary;  Laterality: N/A;     Social History:  reports that he quit smoking about 8 years ago. His smoking use included cigarettes. He has a 40.00 pack-year smoking history. He has never used smokeless tobacco. He reports that he does not currently use alcohol. He reports that he does not use drugs.   Family History:  His family history includes Breast cancer (age of onset: 69) in his paternal aunt; COPD in his mother; Coronary artery disease in an other family member; Diabetes in his father. There is no history of Colon cancer.   Allergies No Known Allergies   Home Medications  Prior to Admission medications   Medication Sig Start Date End Date Taking? Authorizing  Provider  amLODipine (NORVASC) 5 MG tablet TAKE 1 TABLET BY MOUTH EVERY DAY Patient taking differently: Take 5 mg by mouth every evening. 10/27/20  Yes [name], [name], MD  atenolol (TENORMIN) 25 MG tablet TAKE 1 TABLET BY MOUTH DAILY AS NEEDED FOR PALPITATIONS Patient taking differently: Take 25 mg by mouth daily as needed (palpitations). 08/15/20  Yes [name], [name], MD  Cholecalciferol 25 MCG (1000 UT) tablet Take 1,000 Units by mouth daily.   Yes [provider]  diazepam (VALIUM) 5 MG tablet TAKE 1 TABLET(5 MG) BY MOUTH EVERY 12 HOURS Patient taking differently: Take 5 mg by mouth every 12 (twelve) hours as needed for anxiety. 05/30/20  Yes [name], [name], MD  ELIQUIS 5 MG TABS tablet TAKE 1 TABLET(5 MG) BY MOUTH TWICE DAILY Patient taking differently: Take 5 mg by mouth 2 (two) times daily. 12/11/20  Yes Turner, Eber Hong, MD  levothyroxine (SYNTHROID) 112 MCG tablet TAKE 1 TABLET(112 MCG) BY MOUTH DAILY Patient taking differently: Take 112 mcg by mouth daily before breakfast. 11/27/20  Yes Heilingoetter, Cassandra L, PA-C  lovastatin (MEVACOR) 20 MG tablet TAKE 1 TABLET BY MOUTH EVERY NIGHT AT BEDTIME Patient taking differently: Take 20 mg by mouth at bedtime. 10/27/20  Yes [name], [name], MD  Multiple Vitamin (MULTIVITAMIN) tablet Take 1 tablet by mouth daily. Centrum Silver.   Yes [provider]  Polyethyl Glycol-Propyl Glycol (SYSTANE) 0.4-0.3 % SOLN Place 1 drop into both eyes daily as needed (Dry eyes). Ultra   Yes [provider]  prochlorperazine (COMPAZINE) 10 MG tablet Take 1 tablet (10 mg total) by mouth every 6 (six) hours as needed. 11/29/19  Yes Heilingoetter, Cassandra L, PA-C  clotrimazole-betamethasone (LOTRISONE) cream Apply topically 2 (two) times daily. 12/24/20   [name], [name], MD  COVID-19 mRNA bivalent vaccine, Pfizer, (PFIZER COVID-19 VAC BIVALENT) injection Inject into the muscle. 01/13/21   Carlyle Basques, MD   methylPREDNISolone (MEDROL DOSEPAK) 4 MG TBPK tablet As directed Patient not taking: Reported on 01/19/2021 12/24/20   [name], [name], MD

## 2021-02-10 ENCOUNTER — Telehealth: Payer: Self-pay | Admitting: Student

## 2021-02-10 DIAGNOSIS — J8489 Other specified interstitial pulmonary diseases: Secondary | ICD-10-CM | POA: Diagnosis present

## 2021-02-10 DIAGNOSIS — C9110 Chronic lymphocytic leukemia of B-cell type not having achieved remission: Secondary | ICD-10-CM | POA: Diagnosis present

## 2021-02-10 DIAGNOSIS — F411 Generalized anxiety disorder: Secondary | ICD-10-CM | POA: Diagnosis present

## 2021-02-10 DIAGNOSIS — Z803 Family history of malignant neoplasm of breast: Secondary | ICD-10-CM | POA: Diagnosis not present

## 2021-02-10 DIAGNOSIS — Z825 Family history of asthma and other chronic lower respiratory diseases: Secondary | ICD-10-CM | POA: Diagnosis not present

## 2021-02-10 DIAGNOSIS — N62 Hypertrophy of breast: Secondary | ICD-10-CM | POA: Diagnosis present

## 2021-02-10 DIAGNOSIS — Z87891 Personal history of nicotine dependence: Secondary | ICD-10-CM | POA: Diagnosis not present

## 2021-02-10 DIAGNOSIS — E785 Hyperlipidemia, unspecified: Secondary | ICD-10-CM | POA: Diagnosis present

## 2021-02-10 DIAGNOSIS — R7989 Other specified abnormal findings of blood chemistry: Secondary | ICD-10-CM | POA: Diagnosis present

## 2021-02-10 DIAGNOSIS — J432 Centrilobular emphysema: Secondary | ICD-10-CM | POA: Diagnosis present

## 2021-02-10 DIAGNOSIS — E039 Hypothyroidism, unspecified: Secondary | ICD-10-CM | POA: Diagnosis present

## 2021-02-10 DIAGNOSIS — Z20822 Contact with and (suspected) exposure to covid-19: Secondary | ICD-10-CM | POA: Diagnosis present

## 2021-02-10 DIAGNOSIS — I480 Paroxysmal atrial fibrillation: Secondary | ICD-10-CM | POA: Diagnosis present

## 2021-02-10 DIAGNOSIS — Z5320 Procedure and treatment not carried out because of patient's decision for unspecified reasons: Secondary | ICD-10-CM | POA: Diagnosis not present

## 2021-02-10 DIAGNOSIS — Z8249 Family history of ischemic heart disease and other diseases of the circulatory system: Secondary | ICD-10-CM | POA: Diagnosis not present

## 2021-02-10 DIAGNOSIS — C3492 Malignant neoplasm of unspecified part of left bronchus or lung: Secondary | ICD-10-CM | POA: Diagnosis present

## 2021-02-10 DIAGNOSIS — G4733 Obstructive sleep apnea (adult) (pediatric): Secondary | ICD-10-CM | POA: Diagnosis present

## 2021-02-10 DIAGNOSIS — Z833 Family history of diabetes mellitus: Secondary | ICD-10-CM | POA: Diagnosis not present

## 2021-02-10 DIAGNOSIS — F32A Depression, unspecified: Secondary | ICD-10-CM | POA: Diagnosis present

## 2021-02-10 DIAGNOSIS — R627 Adult failure to thrive: Secondary | ICD-10-CM | POA: Diagnosis present

## 2021-02-10 DIAGNOSIS — I10 Essential (primary) hypertension: Secondary | ICD-10-CM | POA: Diagnosis present

## 2021-02-10 DIAGNOSIS — Z923 Personal history of irradiation: Secondary | ICD-10-CM | POA: Diagnosis not present

## 2021-02-10 DIAGNOSIS — K219 Gastro-esophageal reflux disease without esophagitis: Secondary | ICD-10-CM | POA: Diagnosis present

## 2021-02-10 DIAGNOSIS — J9601 Acute respiratory failure with hypoxia: Secondary | ICD-10-CM | POA: Diagnosis present

## 2021-02-10 LAB — CBC
HCT: 37.9 % — ABNORMAL LOW (ref 39.0–52.0)
Hemoglobin: 11.8 g/dL — ABNORMAL LOW (ref 13.0–17.0)
MCH: 28.4 pg (ref 26.0–34.0)
MCHC: 31.1 g/dL (ref 30.0–36.0)
MCV: 91.3 fL (ref 80.0–100.0)
Platelets: 282 10*3/uL (ref 150–400)
RBC: 4.15 MIL/uL — ABNORMAL LOW (ref 4.22–5.81)
RDW: 13.2 % (ref 11.5–15.5)
WBC: 23.2 10*3/uL — ABNORMAL HIGH (ref 4.0–10.5)
nRBC: 0 % (ref 0.0–0.2)

## 2021-02-10 LAB — COMPREHENSIVE METABOLIC PANEL
ALT: 27 U/L (ref 0–44)
AST: 31 U/L (ref 15–41)
Albumin: 2.7 g/dL — ABNORMAL LOW (ref 3.5–5.0)
Alkaline Phosphatase: 70 U/L (ref 38–126)
Anion gap: 9 (ref 5–15)
BUN: 24 mg/dL — ABNORMAL HIGH (ref 8–23)
CO2: 30 mmol/L (ref 22–32)
Calcium: 8.9 mg/dL (ref 8.9–10.3)
Chloride: 99 mmol/L (ref 98–111)
Creatinine, Ser: 0.71 mg/dL (ref 0.61–1.24)
GFR, Estimated: >60 mL/min (ref >60)
Glucose, Bld: 134 mg/dL — ABNORMAL HIGH (ref 70–99)
Potassium: 4.5 mmol/L (ref 3.5–5.1)
Sodium: 138 mmol/L (ref 135–145)
Total Bilirubin: 0.6 mg/dL (ref 0.3–1.2)
Total Protein: 6.8 g/dL (ref 6.5–8.1)

## 2021-02-10 LAB — PROCALCITONIN: Procalcitonin: 0.67 ng/mL

## 2021-02-10 MED ORDER — AMLODIPINE BESYLATE 5 MG PO TABS: 5.0000 mg | ORAL_TABLET | Freq: Every day | ORAL | Status: DC

## 2021-02-10 MED ORDER — LEVOTHYROXINE SODIUM 112 MCG PO TABS
112.0000 ug | ORAL_TABLET | Freq: Every day | ORAL | Status: DC
  Administered 2021-02-10 – 2021-02-13 (×4): 112 ug via ORAL
  Filled 2021-02-10 (×4): qty 1

## 2021-02-10 NOTE — Progress Notes (Signed)
Pt declined nocturnal cpap tonight, prefers to wear nasal cannula instead.  Pt is currently on 2l [place].  Pt was advised that RT is available all night should he change his mind.

## 2021-02-10 NOTE — Progress Notes (Signed)
Initial Nutrition Assessment  DOCUMENTATION CODES:   Not applicable  INTERVENTION:  -Recommend liberalizing diet to regular  -MVI with minerals daily  NUTRITION DIAGNOSIS:   Inadequate oral intake related to decreased appetite as evidenced by per patient/family report.  GOAL:   Patient will meet greater than or equal to 90% of their needs  MONITOR:   PO intake, Supplement acceptance, Weight trends, Labs, I & O's  REASON FOR ASSESSMENT:   Malnutrition Screening Tool    ASSESSMENT:   Pt admitted with acute respiratory failure with hypoxia in the setting of organizing PNA. PMH includes EtOH abuse, hydrologist, cataract, CLL, COPD, de Quervain's tenosynovitis, depression/GAD, Afib, gallstones, GERD, gynecomastia, HLD, HTN, hypothyroid [name], intertrigo, OSA on CPAP, h/o periorbital cellulitis, and h/o retinal detachment.  Oncology made aware of pt's admission d/t stage III squamous cell carcinoma of L lung.   Pt reports issues with appetite off and on for the last several years due to a variety of illnesses/injuries and becoming a widower. Most recently, pt reports a significant decline in appetite/intake beginning 1-1.5 months ago in which his intake has been varied and centered mostly on sweets, particularly chocolate and Coke. States his solid food intake has been primarily bread or muffins. Pt also reports that since last Wednesday, he hasn't even had an interest in his sweets or Cokes and was struggling to get in small amounts of water throughout the day. Pt reports this was due to a complete disinterest in food. Pt states this is starting to improve as he felt hunger and desire to eat meal tray last night and states he "licked the plate clean."  Pt declines all offered supplements and snacks, but is agreeable to RD requesting diet liberalization to optimize PO intake as current diet order is more limited in calories and protein.   Weight history reviewed. No significant weight  changes noted.   No PO Intake documented.    Medications: deltasone, IV abx Labs reviewed.  NUTRITION - FOCUSED PHYSICAL EXAM: Unable to complete at this time as RD is working remotely from another campus. Will attempt at follow-up.   Diet Order:   Diet Order             Diet Heart Room service appropriate? Yes; Fluid consistency: Thin  Diet effective now                   EDUCATION NEEDS:   No education needs have been identified at this time  Skin:  Skin Assessment: Reviewed RN Assessment  Last BM:  PTA  Height:   Ht Readings from Last 1 Encounters:  02/09/21 5\' 11"  (1.803 m)    Weight:   Wt Readings from Last 10 Encounters:  02/09/21 88.5 kg  01/19/21 88.5 kg  12/24/20 87.3 kg  12/22/20 87.6 kg  11/25/20 86.7 kg  10/27/20 87.6 kg  09/29/20 89.5 kg  09/01/20 90.7 kg  08/25/20 90.7 kg  08/04/20 91 kg    BMI:  Body mass index is 27.21 kg/m.  Estimated Nutritional Needs:   Kcal:  2200-2400  Protein:  110-120 grams  Fluid:  >2L     Theone Stanley., MS, RD, LDN (she/her/hers) RD pager number and weekend/on-call pager number located in [place].

## 2021-02-10 NOTE — Telephone Encounter (Signed)
Can we set him up with clinic appointment with myself or [facility] in 4 weeks?

## 2021-02-10 NOTE — Progress Notes (Signed)
   NAME:  Joe [name], MRN:  [id number], DOB:  1945/09/04, LOS: 0 ADMISSION DATE:  02/09/2021, CONSULTATION DATE:  02/09/21 REFERRING MD:  Olevia Bowens, CHIEF COMPLAINT:  dyspnea   History of Present Illness:  75yM with stage IIIb NSCLC, squamous cell carcinoma who originally presented with LUL mass and mediastinal LAD, contralateral R hilar hypermetabolism wh ois s/p chemoXRT now on consolidation IT with imfinzi s/p 13 cycles, CLL dx 2016, COPD, pAF, hypothyroid, GERD, OSA on CPAP who presented to the ED today with worsening dyspnea over the last month but especially over the last several days, home pulse oximetry with saturation in 70s at times. He had cough starting last week, chest congestion but can't quite expectorate anything. Low grade fever over last week. He was given 125 mg IV solumedrol en route by EMS.   Pertinent  Medical History  Stage IIIb NSCLC s/p chemoXRT on consolidation IT with imfinzi CLL COPD pAF Hypothyroid GERD OSA on CPAP  Significant Hospital Events: Including procedures, antibiotic start and stop dates in addition to other pertinent events   02/09/21 admitted, given 125 mg IV solumedrol by EMS  Interim History / Subjective:   Cough is somewhat improved, feeling overall a little better.   Objective   Blood pressure 116/61, pulse 63, temperature 97.6 F (36.4 C), temperature source Oral, resp. rate 18, height 5\' 11"  (1.803 m), weight 88.5 kg, SpO2 95 %.        Intake/Output Summary (Last 24 hours) at 02/10/2021 1820 Last data filed at 02/10/2021 1400 Gross per 24 hour  Intake 709.01 ml  Output 300 ml  Net 409.01 ml   Filed Weights   02/09/21 1038  Weight: 88.5 kg    Examination: General appearance: 75 y.o., male, NAD, conversant Eyes: anicteric sclerae, ; tracking HENT: NCAT; MMM Lungs: Diminished bilaterally, with normal respiratory effort CV: RRR no murmur Abdomen: Soft, non-tender; non-distended, BS present  Extremities: No peripheral edema,  warm Skin: Normal turgor and texture; no rash Psych: Appropriate affect Neuro: Alert and oriented to person and place, no focal deficit    Sputum culture sent  No new imaging  Resolved Hospital Problem list     Assessment & Plan:   # Multifocal consolidative, nodular opacities # Acute hypoxic respiratory insufficiency # COPD # OSA  Organizing pneumonia whether primary or related to IT or infection, atypical/viral pneumonia, CAP, aspiration although pattern isn't characteristic are considerations. - f/u sputum culture - ceftriaxone/azithro - would complete a 5d course of CAP coverage - prednisone 60 mg daily starting tomorrow for 1 week and then taper by 10 mg each week, will ensure he has follow up with pulmonary before he completes taper. He was instructed to let us know if his symptoms worsen as steroid tapered, in which case he is to resume previous steroid dose that held symptoms at [place].  - I have requested clinic follow up in 4 weeks and CT Chest in 8 weeks - will hold off on bronchodilators as it sounds like he's resistant to using these  - CPAP at night and with naps  Will sign off but glad to be reinvolved as condition changes   Best Practice (right click and "Reselect all SmartList Selections" daily)   Per [facility]

## 2021-02-10 NOTE — TOC Progression Note (Signed)
Transition of Care [facility]) - Progression Note    Patient Details  Name: Joe [name] MRN: [id number] Date of Birth: 01-Jan-1946  Transition [facility] Care [facility] CM/SW Contact  Purcell Mouton, RN Phone Number: 02/10/2021, 3:24 PM  Clinical Narrative:     Pt from home alone. TOC will continue to follow for discharge needs.   Expected Discharge Plan: Home/Self Care Barriers to Discharge: No Barriers Identified  Expected Discharge Plan and Services Expected Discharge Plan: Home/Self Care       Living arrangements for the past 2 months: Single Family Home                                       Social Determinants of Health (SDOH) Interventions    Readmission Risk Interventions No flowsheet data found.

## 2021-02-10 NOTE — Progress Notes (Signed)
Patient Saturations on Room Air at Rest =89 %  Patient Saturations on Room Air while Ambulating = 86%  Patient Saturations on 2 Liters of oxygen while Ambulating = 90%

## 2021-02-10 NOTE — Progress Notes (Signed)
PROGRESS NOTE    Joe [name]  OLM:[id number] DOB: [birth year] DOA: 02/09/2021 PCP: Cassandria Anger, MD   Brief Narrative: HPI per Dr. Olevia Bowens on 02/09/2021  Joe [name] is a 75 y.o. male with medical history significant of alcohol abuse,  cataract, CLL, mixed type COPD, de Quervain's tenosynovitis, depression/GAD, paroxysmal atrial fibrillation on apixaban, gallstones, GERD, gynecomastia, hyperlipidemia, hypertension, hypothyroid [name], intertrigo, OSA on CPAP, history of periorbital cellulitis, history of retinal detachment who is coming to the emergency department due to progressively worse dyspnea and fatigue since Wednesday.  He has noticed his oxygen saturation readings have been in the 80s at home.  Today he was very dyspneic and his saturations went down to the 70s, Solu-Medrol 125 mg and then 10 mg albuterol continuous neb.  He has had low-grade fever, but no chills, rhinorrhea.  He has a mild sore throat at times from coughing.  He is unable to expectorate phlegm.  Denied chest pain, diaphoresis, PND, orthopnea or pitting edema of the lower extremities.  He has palpitations on occasion and takes atenolol as needed.  No abdominal pain, diarrhea, constipation, melena or hematochezia.  No dysuria, frequency or hematuria.  No polyuria, polydipsia, polyphagia or blurred vision.   ED Course: Initial vital signs were temperature 99.1 F, pulse 94, respiration 20, BP 122/59 mmHg O2 sat 94%.   Lab work: His CBC showed a white count of 27.6 with 80% neutrophils, hemoglobin 14.5 g/dL platelets 366.  Influenza and coronavirus PCR was negative.  CMP with a sodium 134 and chloride 96 mmol/L.  Renal function was normal.  Glucose 126 mg/dL.  AST was 48 units/L and total bilirubin 1.5 mg/dL.  The rest of the LFTs were normal.   Imaging: A 1 view chest radiograph showed small patchy infiltrate in left upper lung field with no significant interval change.  CTA chest was negative for PE.  There was  increasing mid and lower lung zone predominant subpleural nodularity and consolidation, findings suspicious for immunotherapy related organizing pneumonia.  There is posttreatment collapse/consolidation in the left upper lobe.  No evidence of progressive disease.  Hepatic steatosis.  Cholelithiasis.  Ascending aortic aneurysm which is stable.  Please see images and full radiology report for further details. 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  Patient has pulse ox at home and his readings as low was the low 70s and he came to the ED. His white count on admission was 27 which is trending down to 23. His oxygen saturation on admission was 89% on room air.  Currently he is on 2 L to maintain his sats above 92%. Patient is not on oxygen at home. CT chest reveals multifocal consolidation with nodular opacities?  Related to immunotherapy versus bacterial versus viral.  No evidence of pulmonary embolism. PCCM was consulted.  Recommended Rocephin and azithromycin.  Since patient already received steroids utility for bronc/BAL was thought to be low. They recommended prednisone 60 mg daily for 1 week and then taper  by 10 mg every week and follow-up with pulmonologist before he completes taper. Patient refused CPAP last night. Bronchodilators were not initiated as per since patient was refusing. PT consult Check ambulatory pulse ox  #2 history of paroxysmal atrial fibrillation on Eliquis 5 mg twice a day and  atenolol for rate control.    #3 hyperlipidemia on pravastatin  #4 hypothyroidism on levothyroxine continue  #5 history of essential hypertension on Norvasc and atenolol. His blood pressure has been on the soft side 116/61.  I have stopped Norvasc monitor closely.  #6 history of CLL/stage III squamous cell carcinoma of the left lung followed by Dr. Julien Nordmann  #7 obstructive sleep apnea on CPAP    Nutrition Problem: Inadequate oral intake Etiology: decreased appetite     Signs/Symptoms: per patient/family report    Interventions: MVI, Liberalize Diet  Estimated body mass index is 27.21 kg/m as calculated from the following:   Height as of this encounter: _0  (1.803 m).   Weight as of this encounter: 88.5 kg.  DVT prophylaxis: Eliquis  code Status: Full code Family Communication: None at bedside  disposition Plan:  Status is: Inpatient  Remains inpatient appropriate because: Acute hypoxia in the setting of lung cancer obstructive sleep apnea on CPAP on chemo and immunotherapy on IV antibiotics    Consultants:  PCCM  Procedures: None Antimicrobials: Rocephin and azithromycin  Subjective: He is resting in bed he is very anxious Complaining of shortness of breath and dyspnea on exertion Complains of coughing  Objective: Vitals:   02/10/21 0102 02/10/21 0442 02/10/21 0828 02/10/21 1323  BP: (!) 93/58 102/69 101/63 116/61  Pulse: (!) 54 (!) 55 62 63  Resp: _1 Temp: 97.8 F (36.6 C) 98 F (36.7 C) 98.2 F (36.8 C) 97.6 F (36.4 C)  TempSrc:  Oral Oral Oral  SpO2: 97% 95% 95% 95%  Weight:      Height:        Intake/Output Summary (Last 24 hours) at 02/10/2021 1507 Last data filed at 02/10/2021 1400 Gross per 24 hour  Intake 709.01 ml  Output 300 ml  Net 409.01 ml   Filed Weights   02/09/21 1038  Weight: 88.5 kg    Examination:  General exam: Appears anxious and uncomfortable Respiratory system: Rhonchi to auscultation. Respiratory  effort normal. Cardiovascular system: S1 & S2 heard, RRR. No JVD, murmurs, rubs, gallops or clicks. No pedal edema. Gastrointestinal system: Abdomen is nondistended, soft and nontender. No organomegaly or masses felt. Normal bowel sounds heard. Central nervous system: Alert and oriented. No focal neurological deficits. Extremities: Symmetric 5 x 5 power. Skin: No rashes, lesions or ulcers Psychiatry: Judgement and insight appear normal. Mood & affect appropriate.     Data Reviewed: I have personally reviewed following labs and imaging studies  CBC: Recent Labs  Lab 02/09/21 1026 02/10/21 0337  WBC 27.6* 23.2*  NEUTROABS 22.1*  --   HGB 14.5 11.8*  HCT 45.5 37.9*  MCV 91.2 91.3  PLT 366 456   Basic Metabolic Panel: Recent Labs  Lab 02/09/21 1026 02/10/21 0337  NA 134* 138  K 3.6 4.5  CL 96* 99  CO2 25 30  GLUCOSE 126* 134*  BUN 17 24*  CREATININE 0.85 0.71  CALCIUM 8.8* 8.9   GFR: Estimated Creatinine Clearance: 86.3 mL/min (by C-G formula based on SCr of 0.71 mg/dL). Liver Function Tests: Recent Labs  Lab 02/09/21 1026 02/10/21 0337  AST 48* 31  ALT 31 27  ALKPHOS  89 70  BILITOT 1.5* 0.6  PROT 7.8 6.8  ALBUMIN 3.5 2.7*   No results for input(s): LIPASE, AMYLASE in the last 168 hours. No results for input(s): AMMONIA in the last 168 hours. Coagulation Profile: No results for input(s): INR, PROTIME in the last 168 hours. Cardiac Enzymes: No results for input(s): CKTOTAL, CKMB, CKMBINDEX, TROPONINI in the last 168 hours. BNP (last 3 results) No results for input(s): PROBNP in the last 8760 hours. HbA1C: No results for input(s): HGBA1C in the last 72 hours. CBG: No results for input(s): GLUCAP in the last 168 hours. Lipid Profile: No results for input(s): CHOL, HDL, LDLCALC, TRIG, CHOLHDL, LDLDIRECT in the last 72 hours. Thyroid Function Tests: No results for input(s): TSH, T4TOTAL, FREET4, T3FREE, THYROIDAB in the last 72 hours. Anemia Panel: No  results for input(s): VITAMINB12, FOLATE, FERRITIN, TIBC, IRON, RETICCTPCT in the last 72 hours. Sepsis Labs: Recent Labs  Lab 02/09/21 1026 02/10/21 0337  PROCALCITON 0.79 0.67    Recent Results (from the past 240 hour(s))  Resp Panel by RT-PCR (Flu A&B, Covid) Nasopharyngeal Swab     Status: None   Collection Time: 02/09/21 10:43 AM   Specimen: Nasopharyngeal Swab; Nasopharyngeal(NP) swabs in vial transport medium  Result Value Ref Range Status   SARS Coronavirus 2 by RT PCR NEGATIVE NEGATIVE Final    Comment: (NOTE) SARS-CoV-2 target nucleic acids are NOT DETECTED.  The SARS-CoV-2 RNA is generally detectable in upper respiratory specimens during the acute phase of infection. The lowest concentration of SARS-CoV-2 viral copies this assay can detect is 138 copies/mL. A negative result does not preclude SARS-Cov-2 infection and should not be used as the sole basis for treatment or other patient management decisions. A negative result may occur with  improper specimen collection/handling, submission of specimen other than nasopharyngeal swab, presence of viral mutation(s) within the areas targeted by this assay, and inadequate number of viral copies(<138 copies/mL). A negative result must be combined with clinical observations, patient history, and epidemiological information. The expected result is Negative.  Fact Sheet for Patients:  EntrepreneurPulse.com.au  Fact Sheet for Healthcare Providers:  IncredibleEmployment.be  This test is no t yet approved or cleared by the [place] FDA and  has been authorized for detection and/or diagnosis of SARS-CoV-2 by FDA under an Emergency Use Authorization (EUA). This EUA will remain  in effect (meaning this test can be used) for the duration of the COVID-19 declaration under Section 564(b)(1) of the Act, 21 U.S.C.section 360bbb-3(b)(1), unless the authorization is terminated  or revoked sooner.        Influenza A by PCR NEGATIVE NEGATIVE Final   Influenza B by PCR NEGATIVE NEGATIVE Final    Comment: (NOTE) The Xpert Xpress SARS-CoV-2/FLU/RSV plus assay is intended as an aid in the diagnosis of influenza from Nasopharyngeal swab specimens and should not be used as a sole basis for treatment. Nasal washings and aspirates are unacceptable for Xpert Xpress SARS-CoV-2/FLU/RSV testing.  Fact Sheet for Patients: EntrepreneurPulse.com.au  Fact Sheet for Healthcare Providers: IncredibleEmployment.be  This test is not yet approved or cleared by the [place] FDA and has been authorized for detection and/or diagnosis of SARS-CoV-2 by FDA under an Emergency Use Authorization (EUA). This EUA will remain in effect (meaning this test can be used) for the duration of the COVID-19 declaration under Section 564(b)(1) of the Act, 21 U.S.C. section 360bbb-3(b)(1), unless the authorization is terminated or revoked.  Performed at [facility] [address] [place], [place]  [address]          Radiology Studies: CT Angio Chest PE W and/or Wo Contrast  Result Date: 02/09/2021 CLINICAL DATA:  Worsening shortness of breath, low O2 sats, lung cancer staging. Chemo radiation completed. Last immunotherapy 3 weeks ago. EXAM: CT ANGIOGRAPHY CHEST WITH CONTRAST TECHNIQUE: Multidetector CT imaging of the chest was performed using the standard protocol during bolus administration of intravenous contrast. Multiplanar CT image reconstructions and MIPs were obtained to evaluate the vascular anatomy. CONTRAST:  73m OMNIPAQUE IOHEXOL 350 MG/ML SOLN COMPARISON:  10/24/2020. FINDINGS: Cardiovascular: Negative for pulmonary embolus. Atherosclerotic calcification of the aorta, aortic valve and coronary arteries. Ascending aorta measures up to 4.1 cm (coronal series 7, image 53). Heart is at the upper limits of normal in size. No pericardial effusion.  Mediastinum/Nodes: Mediastinal and hilar lymph nodes are not enlarged by CT size criteria. No axillary adenopathy. Esophagus is grossly unremarkable. Lungs/Pleura: Centrilobular emphysema. Post treatment collapse/consolidation in the left upper lobe, unchanged. Increasing subpleural nodularity and ground-glass in the mid and lower lung zones. No pleural fluid. Airway is unremarkable. Upper Abdomen: Visualized portion of the liver is decreased in attenuation diffusely. Stones in the gallbladder. Visualized portions of the adrenal glands, kidneys, spleen, pancreas, stomach and bowel are grossly unremarkable. Periportal lymph nodes measure up to 1.8 cm (4/160) and are likely reactive. Musculoskeletal: Degenerative changes in the spine. No worrisome lytic or sclerotic lesions. Old left rib fractures. Review of the MIP images confirms the above findings. IMPRESSION: 1. Negative for pulmonary embolus. 2. 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 3. Post treatment collapse/consolidation in the left upper lobe, stable. No evidence of progressive disease. 4. Hepatic steatosis. 5. Cholelithiasis. 6. Ascending aortic aneurysm, stable. Recommend annual imaging followup by CTA or MRA. This recommendation follows 2010 ACCF/AHA/AATS/ACR/ASA/SCA/SCAI/SIR/STS/SVM Guidelines for the Diagnosis and Management of Patients with Thoracic Aortic Disease. Circulation. 2010; 121:: X540-G867 Aortic aneurysm NOS (ICD10-I71.9). 7. Aortic atherosclerosis (ICD10-I70.0). Coronary artery calcification. 8.  Emphysema (ICD10-J43.9). Electronically Signed   By: MLorin PicketM.D.   On: 02/09/2021 12:50   DG Chest Portable 1 View  Result Date: 02/09/2021 CLINICAL DATA:  Shortness of breath EXAM: PORTABLE CHEST 1 VIEW COMPARISON:  Previous studies including the CT chest done on 10/24/2020 FINDINGS: Cardiac size is within normal  limits. There is decreased volume in the left lung. Low position of diaphragms may suggest COPD. Small patchy infiltrate is seen in the left apex. Overall, no significant interval changes are noted in the infiltrate in the left upper lung fields in comparison with the CT done on 10/24/2020. Rest of the lung fields are clear. There is no significant pleural effusion or pneumothorax. IMPRESSION: Small patchy infiltrate is seen in left upper lung fields with no significant interval change. There are no signs of alveolar pulmonary edema or new focal infiltrates. Electronically Signed   By: PElmer PickerM.D.   On: 02/09/2021 11:22        Scheduled Meds:  apixaban  5 mg Oral BID   levothyroxine  112 mcg Oral QAC breakfast   pravastatin  20 mg Oral q1800   [START ON 02/24/2021] predniSONE  40 mg Oral Q breakfast   [START ON 02/17/2021] predniSONE  50 mg Oral Q breakfast   predniSONE  60 mg Oral Q breakfast   Continuous Infusions:  azithromycin Stopped (02/10/21 1151)   cefTRIAXone (ROCEPHIN)  IV Stopped (02/10/21 1045)     LOS: 0 days  Georgette Shell, MD 02/10/2021, 3:07 PM

## 2021-02-11 ENCOUNTER — Telehealth: Payer: Self-pay | Admitting: Medical Oncology

## 2021-02-11 DIAGNOSIS — J9601 Acute respiratory failure with hypoxia: Secondary | ICD-10-CM | POA: Diagnosis not present

## 2021-02-11 LAB — COMPREHENSIVE METABOLIC PANEL WITH GFR
ALT: 85 U/L — ABNORMAL HIGH (ref 0–44)
AST: 110 U/L — ABNORMAL HIGH (ref 15–41)
Albumin: 2.6 g/dL — ABNORMAL LOW (ref 3.5–5.0)
Alkaline Phosphatase: 71 U/L (ref 38–126)
Anion gap: 7 (ref 5–15)
BUN: 26 mg/dL — ABNORMAL HIGH (ref 8–23)
CO2: 31 mmol/L (ref 22–32)
Calcium: 8.9 mg/dL (ref 8.9–10.3)
Chloride: 101 mmol/L (ref 98–111)
Creatinine, Ser: 0.67 mg/dL (ref 0.61–1.24)
GFR, Estimated: >60 mL/min
Glucose, Bld: 104 mg/dL — ABNORMAL HIGH (ref 70–99)
Potassium: 4.1 mmol/L (ref 3.5–5.1)
Sodium: 139 mmol/L (ref 135–145)
Total Bilirubin: 0.5 mg/dL (ref 0.3–1.2)
Total Protein: 6.5 g/dL (ref 6.5–8.1)

## 2021-02-11 LAB — CBC
HCT: 38.1 % — ABNORMAL LOW (ref 39.0–52.0)
Hemoglobin: 12.2 g/dL — ABNORMAL LOW (ref 13.0–17.0)
MCH: 29.5 pg (ref 26.0–34.0)
MCHC: 32 g/dL (ref 30.0–36.0)
MCV: 92.3 fL (ref 80.0–100.0)
Platelets: 304 10*3/uL (ref 150–400)
RBC: 4.13 MIL/uL — ABNORMAL LOW (ref 4.22–5.81)
RDW: 13.2 % (ref 11.5–15.5)
WBC: 20.3 10*3/uL — ABNORMAL HIGH (ref 4.0–10.5)
nRBC: 0 % (ref 0.0–0.2)

## 2021-02-11 LAB — MAGNESIUM: Magnesium: 2.8 mg/dL — ABNORMAL HIGH (ref 1.7–2.4)

## 2021-02-11 LAB — PROCALCITONIN: Procalcitonin: 0.41 ng/mL

## 2021-02-11 MED ORDER — GUAIFENESIN ER 600 MG PO TB12
600.0000 mg | ORAL_TABLET | Freq: Two times a day (BID) | ORAL | Status: DC
  Administered 2021-02-11 – 2021-02-13 (×4): 600 mg via ORAL
  Filled 2021-02-11 (×4): qty 1

## 2021-02-11 NOTE — Evaluation (Signed)
Physical Therapy Evaluation Patient Details Name: Joe [name] MRN: [id number] DOB: 03/10/1946 Today's Date: 02/11/2021  History of Present Illness  Patient is 75 y.o. male who presented on 11/22 to the ED with worsening dyspnea over the last month but especially over the last several days, home pulse oximetry with saturation in 70s at times. PMH significant for stage IIIb NSCLC, squamous cell carcinoma who originally presented with LUL mass and mediastinal LAD, contralateral R hilar hypermetabolism wh ois s/p chemoXRT now on consolidation IT with imfinzi s/p 13 cycles, CLL dx 2016, COPD, pAF, hypothyroid, GERD, OSA on CPAP.    Clinical Impression  Joe [name] is 75 y.o. male admitted with above HPI and diagnosis. Patient is currently limited by functional impairments below (see PT problem list). Patient lives alone and is independent with SPC at baseline for mobility and ADL's. This session pt self-limiting despite education on importance and benefit of OOB activity and ambulation to improve breathing and reduce O2 dependence. Pt sat EOB but declined OOB activity. Educated on benefit of upright sitting, coughing, and IS to improve O2 saturation. Patient will benefit from continued skilled PT interventions to address impairments and progress independence with mobility, recommending HHPT if pt progresses vs ST rehab at SNF if pt not mobilizing safe distance to return home alone. Acute PT will follow and progress as able.        Recommendations for follow up therapy are one component of a multi-disciplinary discharge planning process, led by the attending physician.  Recommendations may be updated based on patient status, additional functional criteria and insurance authorization.  Follow Up Recommendations Home health PT (if pt does not progress may need SNF, has no assist at home)    Assistance Recommended at Discharge Intermittent Supervision/Assistance  Functional Status Assessment  Patient has had a recent decline in their functional status and demonstrates the ability to make significant improvements in function in a reasonable and predictable amount of time.  Equipment Recommendations   (TBA, Oxygen)    Recommendations for Other Services       Precautions / Restrictions Precautions Precautions: Fall Precaution Comments: watch O2 sats Restrictions Weight Bearing Restrictions: No      Mobility  Bed Mobility Overal bed mobility: Needs Assistance Bed Mobility: Supine to Sit;Sit to Supine     Supine to sit: Supervision;HOB elevated Sit to supine: Supervision;HOB elevated   General bed mobility comments: HOB slightly elevated and pt using bed rail and taking extra time. Supervision for safety.Encouragement needed to mobilize.    Transfers                   General transfer comment: pt declined despite encouragement/education on importance. "not up to it right now, will get up later"    Ambulation/Gait                  Stairs            Wheelchair Mobility    Modified Rankin (Stroke Patients Only)       Balance Overall balance assessment: Needs assistance Sitting-balance support: Feet supported Sitting balance-Leahy Scale: Good                                       Pertinent Vitals/Pain Pain Assessment: 0-10 Pain Score: 0-No pain    Home Living Family/patient expects to be discharged to:: Private residence Living Arrangements: Alone  Type of Home: House Home Access: Stairs to enter Entrance Stairs-Rails: Can reach both Entrance Stairs-Number of Steps: 3   Home Layout: One level Home Equipment: Cane - single point Additional Comments: pt reports independent with [facility] for all mobility but admits to having a hard time on unlevel ground outside    Prior Function Prior Level of Function : Independent/Modified Independent;Driving             Mobility Comments: independent with SPC in and out of  home for gait, pt is driving, food shopping ADLs Comments: independent     Hand Dominance   Dominant Hand: Right    Extremity/Trunk Assessment   Upper Extremity Assessment Upper Extremity Assessment: Generalized weakness    Lower Extremity Assessment Lower Extremity Assessment: Generalized weakness    Cervical / Trunk Assessment Cervical / Trunk Assessment: Normal;Neck Surgery  Communication   Communication: No difficulties  Cognition Arousal/Alertness: Awake/alert Behavior During Therapy: WFL for tasks assessed/performed Overall Cognitive Status: Within Functional Limits for tasks assessed                                          General Comments      Exercises     Assessment/Plan    PT Assessment Patient needs continued PT services  PT Problem List Decreased strength;Decreased balance;Decreased mobility;Decreased activity tolerance;Decreased knowledge of use of DME;Decreased knowledge of precautions;Cardiopulmonary status limiting activity       PT Treatment Interventions DME instruction;Gait training;Stair training;Functional mobility training;Therapeutic activities;Therapeutic exercise;Balance training;Neuromuscular re-education;Patient/family education    PT Goals (Current goals can be found in the Care Plan section)  Acute Rehab PT Goals Patient Stated Goal: get recovered and back home PT Goal Formulation: With patient Time For Goal Achievement: 02/25/21 Potential to Achieve Goals: Good    Frequency Min 3X/week   Barriers to discharge Decreased caregiver support pt lives alone    Co-evaluation               AM-PAC PT "6 Clicks" Mobility  Outcome Measure Help needed turning from your back to your side while in a flat bed without using bedrails?: None Help needed moving from lying on your back to sitting on the side of a flat bed without using bedrails?: None Help needed moving to and from a bed to a chair (including a  wheelchair)?: A Little Help needed standing up from a chair using your arms (e.g., wheelchair or bedside chair)?: A Little Help needed to walk in hospital room?: A Little Help needed climbing 3-5 steps with a railing? : A Lot 6 Click Score: 19    End of Session Equipment Utilized During Treatment: Oxygen Activity Tolerance: Patient tolerated treatment well (pt self-limiting) Patient left: in bed;with call bell/phone within reach;with bed alarm set Nurse Communication: Mobility status PT Visit Diagnosis: Unsteadiness on feet (R26.81);Difficulty in walking, not elsewhere classified (R26.2);Muscle weakness (generalized) (M62.81)    Time: 2633-3545 PT Time Calculation (min) (ACUTE ONLY): 30 min   Charges:   PT Evaluation $PT Eval Low Complexity: 1 Low          Verner Mould, DPT Acute Rehabilitation Services Office [phone] Pager [phone]   Jacques Navy 02/11/2021, 11:32 AM

## 2021-02-11 NOTE — Progress Notes (Signed)
Pt refused nocturnal cpap, prefers wearing nasal cannula instead which is currently at 2lpm.  Pt was encouraged to call should he change his mind.

## 2021-02-11 NOTE — Telephone Encounter (Signed)
Pt is admitted for Cough, "extreme SOB. He is  @ [place] room 1431. Dx pneumonia " from immunotherapy".   CT angio done. On oxygen and steroids May go home this week.   I cancelled his upcoming CT .

## 2021-02-11 NOTE — Telephone Encounter (Signed)
NM FYI  Appt scheduled on 12/14 at 10 am with you.

## 2021-02-11 NOTE — Plan of Care (Signed)
  Problem: Education: Goal: Knowledge of General Education information will improve Description Including pain rating scale, medication(s)/side effects and non-pharmacologic comfort measures Outcome: Progressing   

## 2021-02-11 NOTE — Progress Notes (Signed)
PROGRESS NOTE    Joe DEGRAZIA  QIH:[id number] DOB: [birth year] DOA: 02/09/2021 PCP: Cassandria Anger, MD    Chief Complaint  Patient presents with   Shortness of Breath    Brief Narrative:   Joe [name] is a 75 y.o. male with medical history significant of depression/GAD,  HTN, HLD, paroxysmal atrial fibrillation on apixaban, OSA on cpap, CLL, former smoker, COPD,  Stage IIIb non-small cell lung cancer, squamous cell carcinoma currently getting immunotherapy every 4 weeks, last dose on 10/31, presents to the ED after He has noticed his oxygen saturation readings have been in the 80s at home.  Today he was very dyspneic and his saturations went down to the 70s, Solu-Medrol 125 mg and then 10 mg albuterol continuous neb.  He has had low-grade fever, but no chills, rhinorrhea.  He has a mild sore throat at times from coughing.  He is unable to expectorate phlegm  Subjective:  Reports still feeling sob, coughing, he ambulated, 02 dropped to 86% while walking on room air, he was put back on 2liter, o2 increased to 90%, reports not on home o2 prior to coming to the hospital He reports use cpap at home but declined cpap last night  Denies chest pain, no edema in legs He states not ready to go home due to sob, and the fact he lives along, there is no family He would prefer his oncologist to see him in the hospital instead of " third party"  Assessment & Plan:   Principal Problem:   Acute respiratory failure with hypoxia ([place]) Active Problems:   Dyslipidemia   Essential hypertension   COPD mixed type ([place])   CLL (chronic lymphocytic leukemia) ([place])   Hypothyroidism   Paroxysmal atrial fibrillation (H[facility])   OSA on CPAP   Stage III squamous cell carcinoma of left lung ([place])   Organizing pneumonia ([place])  Acute hypoxic respiratory failure -CTA no PE, showed essentially stable lung cancer findings, new findings with increasing mid and lower lung zone predominant subpleural  nodularity and consolidation, 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 Continue apixaban 5 mg p.o. twice daily. Continue atenolol as needed for palpitation or tachycardia.     Dyslipidemia Continue pravastatin 20 mg p.o. every evening.     Essential hypertension Continue amlodipine 5 mg p.o. daily.   CLL (chronic lymphocytic leukemia) (HCC)   Stage III squamous cell carcinoma of left lung [facility] Oncology informed of the patient's admission. Follow-up after scheduled with Dr. Earlie Server.     Hypothyroidism Continue levothyroxine 112 mcg p.o. daily.     OSA on CPAP Continue CPAP at bedtime.  Nutritional Assessment: The patient's BMI is: Body mass index is 27.21 kg/m.[name] Seen by dietician.  I agree with the assessment and plan as outlined below:  Nutrition Status: Nutrition Problem: Inadequate oral intake Etiology: decreased appetite Signs/Symptoms: per patient/family report Interventions: MVI, Liberalize Diet  .    Unresulted Labs (From admission, onward)     Start     Ordered   02/09/21  1504  Expectorated Sputum Assessment w Gram Stain, Rflx to Resp Cult  Once,   R        02/09/21 1503              DVT prophylaxis:  apixaban (ELIQUIS) tablet 5 mg   Code Status:full Family Communication: patient  Disposition:   Status is: Inpatient  Dispo: The patient is from: home, lives by himself              Anticipated d/c is to: home vs snf               Anticipated d/c date is: need to wean o2, need PT eval              Patient currently   Consultants:  Pulmonology   Procedures:  none  Antimicrobials:   Anti-infectives (From admission, onward)    Start     Dose/Rate Route Frequency Ordered Stop   02/09/21 1600  azithromycin (ZITHROMAX) 500 mg in sodium chloride 0.9 % 250 mL IVPB        500 mg 250 mL/hr over 60 Minutes Intravenous Daily 02/09/21 1537     02/09/21 1600  cefTRIAXone (ROCEPHIN) 1 g in sodium chloride 0.9 % 100 mL IVPB        1 g 200 mL/hr over 30 Minutes Intravenous Daily 02/09/21 1537             Objective: Vitals:   02/10/21 0828 02/10/21 1323 02/10/21 2029 02/11/21 0544  BP: 101/63 116/61 110/64 (!) 99/57  Pulse: 62 63 60 (!) 56  Resp: 16 18 18 16   Temp: 98.2 F (36.8 C) 97.6 F (36.4 C) 98.1 F (36.7 C) 98.2 F (36.8 C)  TempSrc: Oral Oral Oral Oral  SpO2: 95% 95% 96% 96%  Weight:      Height:        Intake/Output Summary (Last 24 hours) at 02/11/2021 1025 Last data filed at 02/11/2021 0433 Gross per 24 hour  Intake 709.01 ml  Output 1500 ml  Net -790.99 ml   Filed Weights   02/09/21 1038  Weight: 88.5 kg    Examination:  General exam: alert, awake, communicative,anxious Respiratory system: Clear to auscultation. Respiratory effort normal. Cardiovascular system:  RRR.  Gastrointestinal system: Abdomen is nondistended, soft and nontender.  Normal bowel sounds heard. Central nervous system: Alert and oriented. No focal neurological deficits. Extremities:  no edema Skin: No rashes, lesions or ulcers Psychiatry: Judgement and insight appear normal. Mood & affect appropriate.     Data Reviewed: I have personally reviewed following labs and imaging studies  CBC: Recent Labs  Lab 02/09/21 1026 02/10/21 0337 02/11/21 0343  WBC 27.6* 23.2* 20.3*  NEUTROABS 22.1*  --   --   HGB 14.5 11.8* 12.2*  HCT 45.5 37.9* 38.1*  MCV 91.2 91.3 92.3  PLT 366 282 193    Basic Metabolic  Panel: Recent Labs  Lab 02/09/21 1026 02/10/21 0337 02/11/21 0343  NA 134* 138 139  K 3.6 4.5 4.1  CL 96* 99 101  CO2 25 30 31   GLUCOSE 126* 134* 104*  BUN 17 24* 26*  CREATININE 0.85 0.71 0.67  CALCIUM 8.8* 8.9 8.9  MG  --   --  2.8*    GFR: Estimated Creatinine Clearance: 86.3 mL/min (by C-G formula based on SCr of 0.67 mg/dL).  Liver Function Tests: Recent Labs  Lab 02/09/21 1026 02/10/21 0337 02/11/21 0343  AST 48* 31 110*  ALT 31 27 85*  ALKPHOS 89 70 71  BILITOT 1.5* 0.6 0.5  PROT 7.8 6.8 6.5  ALBUMIN 3.5 2.7* 2.6*    CBG: No results for input(s): GLUCAP in the last 168 hours.   Recent Results (from the past 240 hour(s))  Resp Panel by RT-PCR (Flu A&B, Covid) Nasopharyngeal Swab     Status: None   Collection Time: 02/09/21 10:43 AM   Specimen: Nasopharyngeal Swab; Nasopharyngeal(NP) swabs in vial transport medium  Result Value Ref Range Status   SARS Coronavirus 2 by RT PCR NEGATIVE NEGATIVE Final    Comment: (NOTE) SARS-CoV-2 target nucleic acids are NOT DETECTED.  The SARS-CoV-2 RNA is generally detectable in upper respiratory specimens during the acute phase of infection. The lowest concentration of SARS-CoV-2 viral copies this assay can detect is 138 copies/mL. A negative result does not preclude SARS-Cov-2 infection and should not be used as the sole basis for treatment or other patient management decisions. A negative result may occur with  improper specimen collection/handling, submission of specimen other than nasopharyngeal swab, presence of viral mutation(s) within the areas targeted by this assay, and inadequate number of viral copies(<138 copies/mL). A negative result must be combined with clinical observations, patient history, and epidemiological information. The expected result is Negative.  Fact Sheet for Patients:  EntrepreneurPulse.com.au  Fact Sheet for Healthcare Providers:   IncredibleEmployment.be  This test is no t yet approved or cleared by the [place] FDA and  has been authorized for detection and/or diagnosis of SARS-CoV-2 by FDA under an Emergency Use Authorization (EUA). This EUA will remain  in effect (meaning this test can be used) for the duration of the COVID-19 declaration under Section 564(b)(1) of the Act, 21 U.S.C.section 360bbb-3(b)(1), unless the authorization is terminated  or revoked sooner.       Influenza A by PCR NEGATIVE NEGATIVE Final   Influenza B by PCR NEGATIVE NEGATIVE Final    Comment: (NOTE) The Xpert Xpress SARS-CoV-2/FLU/RSV plus assay is intended as an aid in the diagnosis of influenza from Nasopharyngeal swab specimens and should not be used as a sole basis for treatment. Nasal washings and aspirates are unacceptable for Xpert Xpress SARS-CoV-2/FLU/RSV testing.  Fact Sheet for Patients: EntrepreneurPulse.com.au  Fact Sheet for Healthcare Providers: IncredibleEmployment.be  This test is not yet approved or cleared by the [place] FDA and has been authorized for detection and/or diagnosis of SARS-CoV-2 by FDA under an Emergency Use Authorization (EUA). This EUA will remain in effect (meaning this test can be used) for the duration of the COVID-19 declaration under Section 564(b)(1) of the Act, 21 U.S.C. section 360bbb-3(b)(1), unless the authorization is terminated or revoked.  Performed at [facility], [place] [address] [place], [place] [address]          Radiology Studies: CT Angio Chest PE W and/or Wo Contrast  Result Date: 02/09/2021 CLINICAL DATA:  Worsening shortness of breath, low O2 sats, lung cancer staging. Chemo radiation completed. Last immunotherapy 3 weeks ago. EXAM: CT ANGIOGRAPHY CHEST WITH CONTRAST TECHNIQUE: Multidetector CT imaging of the chest was performed using the standard protocol during bolus  administration of intravenous contrast. Multiplanar CT image reconstructions and MIPs were obtained to evaluate the vascular anatomy. CONTRAST:  27mL OMNIPAQUE IOHEXOL 350 MG/ML SOLN COMPARISON:  10/24/2020. FINDINGS: Cardiovascular: Negative for pulmonary embolus. Atherosclerotic calcification of the aorta, aortic valve and coronary arteries. Ascending aorta measures up to 4.1 cm (coronal series 7, image 53). Heart is at the upper limits of normal in size. No pericardial  effusion. Mediastinum/Nodes: Mediastinal and hilar lymph nodes are not enlarged by CT size criteria. No axillary adenopathy. Esophagus is grossly unremarkable. Lungs/Pleura: Centrilobular emphysema. Post treatment collapse/consolidation in the left upper lobe, unchanged. Increasing subpleural nodularity and ground-glass in the mid and lower lung zones. No pleural fluid. Airway is unremarkable. Upper Abdomen: Visualized portion of the liver is decreased in attenuation diffusely. Stones in the gallbladder. Visualized portions of the adrenal glands, kidneys, spleen, pancreas, stomach and bowel are grossly unremarkable. Periportal lymph nodes measure up to 1.8 cm (4/160) and are likely reactive. Musculoskeletal: Degenerative changes in the spine. No worrisome lytic or sclerotic lesions. Old left rib fractures. Review of the MIP images confirms the above findings. IMPRESSION: 1. Negative for pulmonary embolus. 2. 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 3. Post treatment collapse/consolidation in the left upper lobe, stable. No evidence of progressive disease. 4. Hepatic steatosis. 5. Cholelithiasis. 6. Ascending aortic aneurysm, stable. Recommend annual imaging followup by CTA or MRA. This recommendation follows 2010 ACCF/AHA/AATS/ACR/ASA/SCA/SCAI/SIR/STS/SVM Guidelines for the Diagnosis and Management of Patients with Thoracic Aortic  Disease. Circulation. 2010; 121: O841-Y606. Aortic aneurysm NOS (ICD10-I71.9). 7. Aortic atherosclerosis (ICD10-I70.0). Coronary artery calcification. 8.  Emphysema (ICD10-J43.9). Electronically Signed   By: Lorin Picket M.D.   On: 02/09/2021 12:50   DG Chest Portable 1 View  Result Date: 02/09/2021 CLINICAL DATA:  Shortness of breath EXAM: PORTABLE CHEST 1 VIEW COMPARISON:  Previous studies including the CT chest done on 10/24/2020 FINDINGS: Cardiac size is within normal limits. There is decreased volume in the left lung. Low position of diaphragms may suggest COPD. Small patchy infiltrate is seen in the left apex. Overall, no significant interval changes are noted in the infiltrate in the left upper lung fields in comparison with the CT done on 10/24/2020. Rest of the lung fields are clear. There is no significant pleural effusion or pneumothorax. IMPRESSION: Small patchy infiltrate is seen in left upper lung fields with no significant interval change. There are no signs of alveolar pulmonary edema or new focal infiltrates. Electronically Signed   By: Elmer Picker M.D.   On: 02/09/2021 11:22        Scheduled Meds:  apixaban  5 mg Oral BID   levothyroxine  112 mcg Oral QAC breakfast   pravastatin  20 mg Oral q1800   [START ON 02/24/2021] predniSONE  40 mg Oral Q breakfast   [START ON 02/17/2021] predniSONE  50 mg Oral Q breakfast   predniSONE  60 mg Oral Q breakfast   Continuous Infusions:  azithromycin 500 mg (02/11/21 0924)   cefTRIAXone (ROCEPHIN)  IV Stopped (02/10/21 1045)     LOS: 1 day   Time spent: 40mins Greater than 50% of this time was spent in counseling, explanation of diagnosis, planning of further management, and coordination of care.   Voice Recognition Viviann Spare dictation system was used to create this note, attempts have been made to correct errors. Please contact the author with questions and/or clarifications.   Florencia Reasons, MD PhD FACP [facility]  Available via Epic secure chat 7am-7pm for nonurgent issues Please page for urgent issues To page the attending provider between 7A-7P or the covering provider during after hours 7P-7A, please log into the web site [email or web address] and access using universal [place] password for that web site. If you do not have the password, please call the hospital operator.    02/11/2021, 10:25 AM

## 2021-02-12 DIAGNOSIS — J9601 Acute respiratory failure with hypoxia: Secondary | ICD-10-CM | POA: Diagnosis not present

## 2021-02-12 LAB — CBC WITH DIFFERENTIAL/PLATELET
Abs Immature Granulocytes: 0.16 10*3/uL — ABNORMAL HIGH (ref 0.00–0.07)
Basophils Absolute: 0 10*3/uL (ref 0.0–0.1)
Basophils Relative: 0 %
Eosinophils Absolute: 0 10*3/uL (ref 0.0–0.5)
Eosinophils Relative: 0 %
HCT: 38.8 % — ABNORMAL LOW (ref 39.0–52.0)
Hemoglobin: 12 g/dL — ABNORMAL LOW (ref 13.0–17.0)
Immature Granulocytes: 1 %
Lymphocytes Relative: 13 %
Lymphs Abs: 2.1 10*3/uL (ref 0.7–4.0)
MCH: 29.2 pg (ref 26.0–34.0)
MCHC: 30.9 g/dL (ref 30.0–36.0)
MCV: 94.4 fL (ref 80.0–100.0)
Monocytes Absolute: 1.2 10*3/uL — ABNORMAL HIGH (ref 0.1–1.0)
Monocytes Relative: 8 %
Neutro Abs: 12.2 10*3/uL — ABNORMAL HIGH (ref 1.7–7.7)
Neutrophils Relative %: 78 %
Platelets: 287 10*3/uL (ref 150–400)
RBC: 4.11 MIL/uL — ABNORMAL LOW (ref 4.22–5.81)
RDW: 13.3 % (ref 11.5–15.5)
WBC: 15.6 10*3/uL — ABNORMAL HIGH (ref 4.0–10.5)
nRBC: 0 % (ref 0.0–0.2)

## 2021-02-12 LAB — COMPREHENSIVE METABOLIC PANEL
ALT: 90 U/L — ABNORMAL HIGH (ref 0–44)
AST: 70 U/L — ABNORMAL HIGH (ref 15–41)
Albumin: 2.6 g/dL — ABNORMAL LOW (ref 3.5–5.0)
Alkaline Phosphatase: 70 U/L (ref 38–126)
Anion gap: 5 (ref 5–15)
BUN: 22 mg/dL (ref 8–23)
CO2: 31 mmol/L (ref 22–32)
Calcium: 8.6 mg/dL — ABNORMAL LOW (ref 8.9–10.3)
Chloride: 103 mmol/L (ref 98–111)
Creatinine, Ser: 0.61 mg/dL (ref 0.61–1.24)
GFR, Estimated: >60 mL/min (ref >60)
Glucose, Bld: 91 mg/dL (ref 70–99)
Potassium: 4.1 mmol/L (ref 3.5–5.1)
Sodium: 139 mmol/L (ref 135–145)
Total Bilirubin: 0.6 mg/dL (ref 0.3–1.2)
Total Protein: 6.2 g/dL — ABNORMAL LOW (ref 6.5–8.1)

## 2021-02-12 LAB — CK: Total CK: 58 U/L (ref 49–397)

## 2021-02-12 NOTE — Progress Notes (Signed)
PROGRESS NOTE    Joe [name]  WJX:[id number] DOB: 1945-04-23 DOA: 02/09/2021 PCP: Cassandria Anger, MD    Chief Complaint  Patient presents with   Shortness of Breath    Brief Narrative:   Joe [name] is a 75 y.o. male with medical history significant of depression/GAD,  HTN, HLD, paroxysmal atrial fibrillation on apixaban, OSA on cpap, CLL, former smoker, COPD,  Stage IIIb non-small cell lung cancer, squamous cell carcinoma currently getting immunotherapy every 4 weeks, last dose on 10/31, presents to the ED after He has noticed his oxygen saturation readings have been in the 80s at home.  Today he was very dyspneic and his saturations went down to the 70s, Solu-Medrol 125 mg and then 10 mg albuterol continuous neb.  He has had low-grade fever, but no chills, rhinorrhea.  He has a mild sore throat at times from coughing.  He is unable to expectorate phlegm  Subjective:  Reports still less sob, less cough, no chest pain, no edema 02 was in low 90's at rest on room air this morning, he was put back on 2liters, o2 sats at 98%he ambulated,  Plan to walk and check o2 sats while walking  He reports use cpap at home but declined cpap last night   He states not ready to go home due to sob, and the fact he lives along, there is no family   Assessment & Plan:   Principal Problem:   Acute respiratory failure with hypoxia ([employer]) Active Problems:   Dyslipidemia   Essential hypertension   COPD mixed type (HCC)   CLL (chronic lymphocytic leukemia) (HCC)   Hypothyroidism   Paroxysmal atrial fibrillation (HCC)   OSA on CPAP   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)   Organizing pneumonia ([place])  Acute hypoxic respiratory failure -CTA no PE, showed essentially stable lung cancer findings, new findings with increasing mid and lower lung zone predominant subpleural nodularity and consolidation, 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 Continue apixaban 5 mg p.o. twice daily. Continue atenolol as needed for palpitation or tachycardia.  Bp low normal without atenolol     Dyslipidemia Continue pravastatin 20 mg p.o. every evening.     Essential hypertension Continue amlodipine 5 mg p.o. daily.   CLL (chronic lymphocytic leukemia) (HCC)   Stage III squamous cell carcinoma of left lung [facility]) Oncology informed of the patient's admission. Follow-up after scheduled with Dr. Earlie Server.     Hypothyroidism Continue levothyroxine 112 mcg p.o. daily.     OSA on CPAP Continue CPAP at bedtime.  Nutritional Assessment: The patient's BMI is: Body mass index is 27.21 kg/m.[name] Seen by dietician.  I agree with the assessment and plan as outlined below:  Nutrition Status: Nutrition Problem: Inadequate oral intake Etiology: decreased appetite Signs/Symptoms: per patient/family report Interventions: MVI, Liberalize Diet  .    Unresulted Labs (From admission, onward)     Start     Ordered   02/11/21 1040  Expectorated Sputum Assessment w Gram  Stain, Rflx to Resp Cult  Once,   R        02/11/21 1039   02/09/21 1504  Expectorated Sputum Assessment w Gram Stain, Rflx to Resp Cult  Once,   R        02/09/21 1503              DVT prophylaxis:  apixaban (ELIQUIS) tablet 5 mg   Code Status:full Family Communication: patient  Disposition:   Status is: Inpatient  Dispo: The patient is from: home, lives by himself               Anticipated d/c is to: home vs snf              Anticipated d/c date is: need to wean o2, need PT eval, he might need snf, but he is reluctant                 Consultants:  Pulmonology   Procedures:  none  Antimicrobials:   Anti-infectives (From admission, onward)    Start     Dose/Rate Route Frequency Ordered Stop   02/09/21 1600  azithromycin (ZITHROMAX) 500 mg in sodium chloride 0.9 % 250 mL IVPB        500 mg 250 mL/hr over 60 Minutes Intravenous Daily 02/09/21 1537     02/09/21 1600  cefTRIAXone (ROCEPHIN) 1 g in sodium chloride 0.9 % 100 mL IVPB        1 g 200 mL/hr over 30 Minutes Intravenous Daily 02/09/21 1537             Objective: Vitals:   02/11/21 1329 02/11/21 2053 02/12/21 0437 02/12/21 0443  BP: 124/68 107/68 (!) 96/58 107/72  Pulse: 64 (!) 57 (!) 50 (!) 51  Resp: 18 20 18 18   Temp: 98.8 F (37.1 C) 98.1 F (36.7 C) 97.9 F (36.6 C)   TempSrc: Oral Oral    SpO2: 95% 97% 98% 98%  Weight:      Height:        Intake/Output Summary (Last 24 hours) at 02/12/2021 1216 Last data filed at 02/12/2021 0600 Gross per 24 hour  Intake 918.77 ml  Output 300 ml  Net 618.77 ml   Filed Weights   02/09/21 1038  Weight: 88.5 kg    Examination:  General exam: alert, awake, communicative, less anxious Respiratory system: mild scattered wheezing, cleared up after clear his throat. Respiratory effort normal. Cardiovascular system:  RRR.  Gastrointestinal system: Abdomen is nondistended, soft and nontender.  Normal bowel sounds heard. Central nervous system: Alert and oriented. No focal neurological deficits. Extremities:  no edema Skin: No rashes, lesions or ulcers Psychiatry: Judgement and insight appear normal. Mood & affect appropriate.     Data Reviewed: I have personally reviewed following labs and imaging studies  CBC: Recent Labs  Lab 02/09/21 1026 02/10/21 0337 02/11/21 0343 02/12/21 0350  WBC 27.6* 23.2* 20.3* 15.6*  NEUTROABS 22.1*   --   --  12.2*  HGB 14.5 11.8* 12.2* 12.0*  HCT 45.5 37.9* 38.1* 38.8*  MCV 91.2 91.3 92.3 94.4  PLT 366 282 304 825    Basic Metabolic Panel: Recent Labs  Lab 02/09/21 1026 02/10/21 0337 02/11/21 0343 02/12/21 0350  NA 134* 138 139 139  K 3.6 4.5 4.1 4.1  CL 96* 99 101 103  CO2 25 30 31 31   GLUCOSE 126* 134* 104* 91  BUN 17 24* 26* 22  CREATININE 0.85 0.71 0.67 0.61  CALCIUM 8.8*  8.9 8.9 8.6*  MG  --   --  2.8*  --     GFR: Estimated Creatinine Clearance: 86.3 mL/min (by C-G formula based on SCr of 0.61 mg/dL).  Liver Function Tests: Recent Labs  Lab 02/09/21 1026 02/10/21 0337 02/11/21 0343 02/12/21 0350  AST 48* 31 110* 70*  ALT 31 27 85* 90*  ALKPHOS 89 70 71 70  BILITOT 1.5* 0.6 0.5 0.6  PROT 7.8 6.8 6.5 6.2*  ALBUMIN 3.5 2.7* 2.6* 2.6*    CBG: No results for input(s): GLUCAP in the last 168 hours.   Recent Results (from the past 240 hour(s))  Resp Panel by RT-PCR (Flu A&B, Covid) Nasopharyngeal Swab     Status: None   Collection Time: 02/09/21 10:43 AM   Specimen: Nasopharyngeal Swab; Nasopharyngeal(NP) swabs in vial transport medium  Result Value Ref Range Status   SARS Coronavirus 2 by RT PCR NEGATIVE NEGATIVE Final    Comment: (NOTE) SARS-CoV-2 target nucleic acids are NOT DETECTED.  The SARS-CoV-2 RNA is generally detectable in upper respiratory specimens during the acute phase of infection. The lowest concentration of SARS-CoV-2 viral copies this assay can detect is 138 copies/mL. A negative result does not preclude SARS-Cov-2 infection and should not be used as the sole basis for treatment or other patient management decisions. A negative result may occur with  improper specimen collection/handling, submission of specimen other than nasopharyngeal swab, presence of viral mutation(s) within the areas targeted by this assay, and inadequate number of viral copies(<138 copies/mL). A negative result must be combined with clinical observations,  patient history, and epidemiological information. The expected result is Negative.  Fact Sheet for Patients:  EntrepreneurPulse.com.au  Fact Sheet for Healthcare Providers:  IncredibleEmployment.be  This test is no t yet approved or cleared by the [place] FDA and  has been authorized for detection and/or diagnosis of SARS-CoV-2 by FDA under an Emergency Use Authorization (EUA). This EUA will remain  in effect (meaning this test can be used) for the duration of the COVID-19 declaration under Section 564(b)(1) of the Act, 21 U.S.C.section 360bbb-3(b)(1), unless the authorization is terminated  or revoked sooner.       Influenza A by PCR NEGATIVE NEGATIVE Final   Influenza B by PCR NEGATIVE NEGATIVE Final    Comment: (NOTE) The Xpert Xpress SARS-CoV-2/FLU/RSV plus assay is intended as an aid in the diagnosis of influenza from Nasopharyngeal swab specimens and should not be used as a sole basis for treatment. Nasal washings and aspirates are unacceptable for Xpert Xpress SARS-CoV-2/FLU/RSV testing.  Fact Sheet for Patients: EntrepreneurPulse.com.au  Fact Sheet for Healthcare Providers: IncredibleEmployment.be  This test is not yet approved or cleared by the [place] FDA and has been authorized for detection and/or diagnosis of SARS-CoV-2 by FDA under an Emergency Use Authorization (EUA). This EUA will remain in effect (meaning this test can be used) for the duration of the COVID-19 declaration under Section 564(b)(1) of the Act, 21 U.S.C. section 360bbb-3(b)(1), unless the authorization is terminated or revoked.  Performed at [facility], [place] [address], [place], [place] [address]          Radiology Studies: No results found.      Scheduled Meds:  apixaban  5 mg Oral BID   guaiFENesin  600 mg Oral BID   levothyroxine  112 mcg Oral QAC breakfast   pravastatin   20 mg Oral q1800   [START ON 02/24/2021] predniSONE  40 mg Oral Q breakfast   [  START ON 02/17/2021] predniSONE  50 mg Oral Q breakfast   predniSONE  60 mg Oral Q breakfast   Continuous Infusions:  azithromycin 500 mg (02/12/21 1113)   cefTRIAXone (ROCEPHIN)  IV 1 g (02/12/21 1004)     LOS: 2 days   Time spent: 55mins Greater than 50% of this time was spent in counseling, explanation of diagnosis, planning of further management, and coordination of care.   Voice Recognition Viviann Spare dictation system was used to create this note, attempts have been made to correct errors. Please contact the author with questions and/or clarifications.   Florencia Reasons, MD PhD FACP [facility]  Available via Epic secure chat 7am-7pm for nonurgent issues Please page for urgent issues To page the attending provider between 7A-7P or the covering provider during after hours 7P-7A, please log into the web site [email or web address] and access using universal  password for that web site. If you do not have the password, please call the hospital operator.    02/12/2021, 12:16 PM

## 2021-02-12 NOTE — Progress Notes (Signed)
Pt does not want to use cpap while here in the hospital.  Pt is aware that RT is available all night should he change his mind.  RX changed to prn.

## 2021-02-12 NOTE — Progress Notes (Signed)
SATURATION QUALIFICATIONS: (This note is used to comply with regulatory documentation for home oxygen)  Patient Saturations on Room Air at Rest = 90%  Patient Saturations on Room Air while Ambulating = 82%   Please briefly explain why patient needs home oxygen:

## 2021-02-13 DIAGNOSIS — J9601 Acute respiratory failure with hypoxia: Secondary | ICD-10-CM | POA: Diagnosis not present

## 2021-02-13 MED ORDER — PREDNISONE 10 MG PO TABS: ORAL_TABLET | ORAL | 0 refills | Status: DC

## 2021-02-13 MED ORDER — FAMOTIDINE 20 MG PO TABS: 20.0000 mg | ORAL_TABLET | Freq: Every day | ORAL | 1 refills | Status: DC

## 2021-02-13 MED ORDER — GUAIFENESIN ER 600 MG PO TB12
600.0000 mg | ORAL_TABLET | Freq: Two times a day (BID) | ORAL | Status: DC
Start: 2021-02-13 — End: 2022-01-20

## 2021-02-13 MED ORDER — AMLODIPINE BESYLATE 5 MG PO TABS: 5.0000 mg | ORAL_TABLET | Freq: Every evening | ORAL | Status: DC

## 2021-02-13 NOTE — TOC Transition Note (Signed)
Transition of Care [facility]) - CM/SW Discharge Note   Patient Details  Name: Joe [name] MRN: [id number] Date of Birth: [birth year]  Transition of Care [facility]) CM/SW Contact:  Servando Snare, LCSW Phone Number: 02/13/2021, 11:21 AM   Clinical Narrative:   TOC consulted at dc o2 arranged for patient. LCSW spoke with patient by phone. Patient declines [place] [facility].   Servando Snare, LSCW Elvina Sidle CSW Transitions of care, Relief     Final next level of care: Home/Self Care Barriers to Discharge: No Barriers Identified   Patient Goals and CMS Choice Patient states their goals for this hospitalization and ongoing recovery are:: To get better CMS Medicare.gov Compare Post Acute Care list provided to:: Patient Choice offered to / list presented to : NA  Discharge Placement                       Discharge Plan and Services                DME Arranged: Oxygen DME Agency: [employer] Date DME Agency Contacted: 02/13/21 Time DME Agency Contacted: [phone] Representative spoke with at DME Agency: [place] ([place] Interventions     Readmission Risk Interventions No flowsheet data found.

## 2021-02-13 NOTE — Discharge Summary (Addendum)
Discharge Summary  Joe [name] VEL:[id number] DOB: May 22, 1945  PCP: Joe Anger, MD  Admit date: 02/09/2021 Discharge date: 02/13/2021  Time spent: 26mins, more than 50% time spent on coordination of care.   Recommendations for Outpatient Follow-up:  F/u with PCP within a week  for hospital discharge follow up, repeat cbc/bmp/lft  at follow up F/u with oncology F/u with pulmonology Home health PT, home 02 ordered Addendum: he declined home health PT    Discharge Diagnoses:  Active Hospital Problems   Diagnosis Date Noted   Acute respiratory failure with hypoxia ([place]) 02/09/2021   Organizing pneumonia ([place]) 02/09/2021   Stage III squamous cell carcinoma of left lung ([place]) 11/29/2019   OSA on CPAP    Paroxysmal atrial fibrillation ([place]) 02/06/2019   Hypothyroidism 12/25/2014   CLL (chronic lymphocytic leukemia) ([place]) 08/08/2014   Essential hypertension 07/26/2007   COPD mixed type ([place]) 07/26/2007   Dyslipidemia 12/02/2006    Resolved Hospital Problems  No resolved problems to display.    Discharge Condition: stable  Diet recommendation: heart healthy  Filed Weights   02/09/21 1038  Weight: 88.5 kg    History of present illness: (Per admitting MD Joe [name])  Chief Complaint: Shortness of breath.   HPI: Joe [name] is a 75 y.o. male with medical history significant of alcohol abuse, hydrologist, cataract, CLL, mixed type COPD, de Quervain's tenosynovitis, depression/GAD, paroxysmal atrial fibrillation on apixaban, gallstones, GERD, gynecomastia, hyperlipidemia, hypertension, hypothyroid Joe [name], intertrigo, OSA on CPAP, history of periorbital cellulitis, history of retinal detachment who is coming to the emergency department due to progressively worse dyspnea and fatigue since Wednesday.  He has noticed his oxygen saturation readings have been in the 80s at home.  Today he was very dyspneic and his saturations went down to the 70s, Solu-Medrol 125 mg  and then 10 mg albuterol continuous neb.  He has had low-grade fever, but no chills, rhinorrhea.  He has a mild sore throat at times from coughing.  He is unable to expectorate phlegm.  Denied chest pain, diaphoresis, PND, orthopnea or pitting edema of the lower extremities.  He has palpitations on occasion and takes atenolol as needed.  No abdominal pain, diarrhea, constipation, melena or hematochezia.  No dysuria, frequency or hematuria.  No polyuria, polydipsia, polyphagia or blurred vision.   ED Course: Initial vital signs were temperature 99.1 F, pulse 94, respiration 20, BP 122/59 mmHg O2 sat 94%.   Lab work: His CBC showed a white count of 27.6 with 80% neutrophils, hemoglobin 14.5 g/dL platelets 366.  Influenza and coronavirus PCR was negative.  CMP with a sodium 134 and chloride 96 mmol/L.  Renal function was normal.  Glucose 126 mg/dL.  AST was 48 units/L and total bilirubin 1.5 mg/dL.  The rest of the LFTs were normal.   Imaging: A 1 view chest radiograph showed small patchy infiltrate in left upper lung field with no significant interval change.  CTA chest was negative for PE.  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  There is posttreatment collapse/consolidation in the left upper lobe.  No evidence of progressive disease.  Hepatic steatosis.  Cholelithiasis.  Ascending aortic aneurysm which is stable.  Please see images and full radiology report for further details. Hospital Course:  Principal Problem:   Acute respiratory failure with hypoxia (HCC) Active Problems:   Dyslipidemia   Essential hypertension   COPD mixed type ([place])  CLL (chronic lymphocytic leukemia) (HCC)   Hypothyroidism   Paroxysmal atrial fibrillation (HCC)   OSA on CPAP   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)   Organizing pneumonia (HCC)  Acute hypoxic respiratory  failure -CTA no PE, showed essentially stable lung cancer findings, new findings with increasing mid and lower lung zone predominant subpleural nodularity and consolidation, findings suspicious for immunotherapy related organizing pneumonia -Sputum culture not able to collect, cough is nonproductive, cough appears has improved/resolved at discharge -Seen by pulmonology, recommend abxx5 days which he finished in the hospital,  due to already started on steroid bronchoscopy would be low yield, recommend start with prednisone slow taper, taper by 10 mg each week, (pepcid ordered for him while taking prednisone) , follow-up with pulmonology before complete taper, to repeat CT scan He meets home o2 criteria after home o2 eval, home 02 ordered     LFT elevation for unclear reason Mild, unremarkable ck, monitor volume status  Trending down, repeat lft at hospital discharge follow up   COPD He does not like to use broncodilators  No wheezing   Paroxysmal atrial fibrillation (HCC) CHA?DS?-VASc Score of at least 3. Continue apixaban 5 mg p.o. twice daily. Continue atenolol as needed for palpitation or tachycardia.   Bp low normal without atenolol Hold norvasc, patient is advised to check bp at home, f/u with pcp     Dyslipidemia Continue pravastatin 20 mg p.o. every evening.       CLL (chronic lymphocytic leukemia) (HCC)   Stage III squamous cell carcinoma of left lung [facility]) Oncology informed of the patient's admission. Follow-up after scheduled with Dr. Earlie [name].     Hypothyroidism Continue levothyroxine 112 mcg p.o. daily.     OSA on CPAP Continue CPAP at bedtime.  FTT, PT recommends home health PT, order placed   Nutritional Assessment: The patient's BMI is: Body mass index is 27.21 kg/m.[name] Seen by dietician.  I agree with the assessment and plan as outlined below:   Nutrition Status: Nutrition Problem: Inadequate oral intake Etiology: decreased appetite Signs/Symptoms: per  patient/family report Interventions: MVI, Liberalize Diet    Consultants:  Pulmonology    Procedures:  none    Discharge Exam: BP 119/70 (BP Location: Right Arm)   Pulse 90   Temp 97.7 F (36.5 C) (Oral)   Resp 19   Ht 5\' 11"  (1.803 m)   Wt 88.5 kg   SpO2 93%   BMI 27.21 kg/m   General: NAD, appear stronger Cardiovascular: RRR Respiratory: normal respiratory effort  Discharge Instructions You were cared for by a hospitalist during your hospital stay. If you have any questions about your discharge medications or the care you received while you were in the hospital after you are discharged, you can call the unit and asked to speak with the hospitalist on call if the hospitalist that took care of you is not available. Once you are discharged, your primary care physician will handle any further medical issues. Please note that NO REFILLS for any discharge medications will be authorized once you are discharged, as it is imperative that you return to your primary care physician (or establish a relationship with a primary care physician if you do not have one) for your aftercare needs so that they can reassess your need for medications and monitor your lab values.  Discharge Instructions     Diet - low sodium heart healthy   Complete by: As directed    Increase activity slowly  Complete by: As directed       Allergies as of 02/13/2021   No Known Allergies      Medication List     STOP taking these medications    methylPREDNISolone 4 MG Tbpk tablet Commonly known as: [place] COVID-19 Vac Bivalent injection Generic drug: COVID-19 mRNA bivalent vaccine [employer])       TAKE these medications    amLODipine 5 MG tablet Commonly known as: NORVASC Take 1 tablet (5 mg total) by mouth every evening. Please hold this medication for now, please check your blood pressure daily in the morning, bring in record for your pcp to review. Your pcp and you can  decide on resumption or discontinuation of this medication What changed:  when to take this additional instructions   atenolol 25 MG tablet Commonly known as: TENORMIN TAKE 1 TABLET BY MOUTH DAILY AS NEEDED FOR PALPITATIONS What changed: See the new instructions.   Cholecalciferol 25 MCG (1000 UT) tablet Take 1,000 Units by mouth daily.   clotrimazole-betamethasone cream Commonly known as: Lotrisone Apply topically 2 (two) times daily.   diazepam 5 MG tablet Commonly known as: VALIUM TAKE 1 TABLET(5 MG) BY MOUTH EVERY 12 HOURS What changed: See the new instructions.   Eliquis 5 MG Tabs tablet Generic drug: apixaban TAKE 1 TABLET(5 MG) BY MOUTH TWICE DAILY What changed: See the new instructions.   famotidine 20 MG tablet Commonly known as: PEPCID Take 1 tablet (20 mg total) by mouth at bedtime.   guaiFENesin 600 MG 12 hr tablet Commonly known as: MUCINEX Take 1 tablet (600 mg total) by mouth 2 (two) times daily.   levothyroxine 112 MCG tablet Commonly known as: SYNTHROID TAKE 1 TABLET(112 MCG) BY MOUTH DAILY What changed: See the new instructions.   lovastatin 20 MG tablet Commonly known as: MEVACOR TAKE 1 TABLET BY MOUTH EVERY NIGHT AT BEDTIME   multivitamin tablet Take 1 tablet by mouth daily. Centrum Silver.   predniSONE 10 MG tablet Commonly known as: DELTASONE Label  & dispense according to the schedule below. Please take prednisone with breakfast daily until stopped. Take  6 Pills PO daily for three days then, 5 Pills PO daily for 7 days, then 4 Pills PO daily  for 7 days, then  3Pills PO daily for 7 days,then 2 Pills PO daily for 7 days, 1 Pills PO daily for 7 days,  then STOP , further instruction regarding prednisone to be determined by your pulmonologist Dr Verlee Monte. Total of 123 tabs   prochlorperazine 10 MG tablet Commonly known as: COMPAZINE Take 1 tablet (10 mg total) by mouth every 6 (six) hours as needed.   Systane 0.4-0.3 % Soln Generic drug:  Polyethyl Glycol-Propyl Glycol Place 1 drop into both eyes daily as needed (Dry eyes). Ultra               Durable Medical Equipment  (From admission, onward)           Start     Ordered   02/13/21 1044  DME Oxygen  Once       Question Answer Comment  Length of Need Lifetime   Mode or (Route) Nasal cannula   Liters per Minute 2   Frequency Continuous (stationary and portable oxygen unit needed)   Oxygen conserving device Yes   Oxygen delivery system Gas      02/13/21 1044           No Known Allergies  Follow-up Information  [name], [name], MD Follow up.   Specialty: Internal Medicine Contact information: [place] [address] [phone]                  The results of significant diagnostics from this hospitalization (including imaging, microbiology, ancillary and laboratory) are listed below for reference.    Significant Diagnostic Studies: CT Angio Chest PE W and/or Wo Contrast  Result Date: 02/09/2021 CLINICAL DATA:  Worsening shortness of breath, low O2 sats, lung cancer staging. Chemo radiation completed. Last immunotherapy 3 weeks ago. EXAM: CT ANGIOGRAPHY CHEST WITH CONTRAST TECHNIQUE: Multidetector CT imaging of the chest was performed using the standard protocol during bolus administration of intravenous contrast. Multiplanar CT image reconstructions and MIPs were obtained to evaluate the vascular anatomy. CONTRAST:  8mL OMNIPAQUE IOHEXOL 350 MG/ML SOLN COMPARISON:  10/24/2020. FINDINGS: Cardiovascular: Negative for pulmonary embolus. Atherosclerotic calcification of the aorta, aortic valve and coronary arteries. Ascending aorta measures up to 4.1 cm (coronal series 7, image 53). Heart is at the upper limits of normal in size. No pericardial effusion. Mediastinum/Nodes: Mediastinal and hilar lymph nodes are not enlarged by CT size criteria. No axillary adenopathy. Esophagus is grossly unremarkable. Lungs/Pleura:  Centrilobular emphysema. Post treatment collapse/consolidation in the left upper lobe, unchanged. Increasing subpleural nodularity and ground-glass in the mid and lower lung zones. No pleural fluid. Airway is unremarkable. Upper Abdomen: Visualized portion of the liver is decreased in attenuation diffusely. Stones in the gallbladder. Visualized portions of the adrenal glands, kidneys, spleen, pancreas, stomach and bowel are grossly unremarkable. Periportal lymph nodes measure up to 1.8 cm (4/160) and are likely reactive. Musculoskeletal: Degenerative changes in the spine. No worrisome lytic or sclerotic lesions. Old left rib fractures. Review of the MIP images confirms the above findings. IMPRESSION: 1. Negative for pulmonary embolus. 2. 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 3. Post treatment collapse/consolidation in the left upper lobe, stable. No evidence of progressive disease. 4. Hepatic steatosis. 5. Cholelithiasis. 6. Ascending aortic aneurysm, stable. Recommend annual imaging followup by CTA or MRA. This recommendation follows 2010 ACCF/AHA/AATS/ACR/ASA/SCA/SCAI/SIR/STS/SVM Guidelines for the Diagnosis and Management of Patients with Thoracic Aortic Disease. Circulation. 2010; 121: K932-I712. Aortic aneurysm NOS (ICD10-I71.9). 7. Aortic atherosclerosis (ICD10-I70.0). Coronary artery calcification. 8.  Emphysema (ICD10-J43.9). Electronically Signed   By: Lorin Picket M.D.   On: 02/09/2021 12:50   DG Chest Portable 1 View  Result Date: 02/09/2021 CLINICAL DATA:  Shortness of breath EXAM: PORTABLE CHEST 1 VIEW COMPARISON:  Previous studies including the CT chest done on 10/24/2020 FINDINGS: Cardiac size is within normal limits. There is decreased volume in the left lung. Low position of diaphragms may suggest COPD. Small patchy infiltrate is seen in the left apex. Overall, no significant  interval changes are noted in the infiltrate in the left upper lung fields in comparison with the CT done on 10/24/2020. Rest of the lung fields are clear. There is no significant pleural effusion or pneumothorax. IMPRESSION: Small patchy infiltrate is seen in left upper lung fields with no significant interval change. There are no signs of alveolar pulmonary edema or new focal infiltrates. Electronically Signed   By: Elmer Picker M.D.   On: 02/09/2021 11:22    Microbiology: Recent Results (from the past 240 hour(s))  Resp Panel by RT-PCR (Flu A&B, Covid) Nasopharyngeal Swab     Status: None   Collection Time: 02/09/21 10:43 AM   Specimen: Nasopharyngeal Swab; Nasopharyngeal(NP) swabs  in vial transport medium  Result Value Ref Range Status   SARS Coronavirus 2 by RT PCR NEGATIVE NEGATIVE Final    Comment: (NOTE) SARS-CoV-2 target nucleic acids are NOT DETECTED.  The SARS-CoV-2 RNA is generally detectable in upper respiratory specimens during the acute phase of infection. The lowest concentration of SARS-CoV-2 viral copies this assay can detect is 138 copies/mL. A negative result does not preclude SARS-Cov-2 infection and should not be used as the sole basis for treatment or other patient management decisions. A negative result may occur with  improper specimen collection/handling, submission of specimen other than nasopharyngeal swab, presence of viral mutation(s) within the areas targeted by this assay, and inadequate number of viral copies(<138 copies/mL). A negative result must be combined with clinical observations, patient history, and epidemiological information. The expected result is Negative.  Fact Sheet for Patients:  EntrepreneurPulse.com.au  Fact Sheet for Healthcare Providers:  IncredibleEmployment.be  This test is no t yet approved or cleared by the [place] FDA and  has been authorized for detection and/or diagnosis of  SARS-CoV-2 by FDA under an Emergency Use Authorization (EUA). This EUA will remain  in effect (meaning this test can be used) for the duration of the COVID-19 declaration under Section 564(b)(1) of the Act, 21 U.S.C.section 360bbb-3(b)(1), unless the authorization is terminated  or revoked sooner.       Influenza A by PCR NEGATIVE NEGATIVE Final   Influenza B by PCR NEGATIVE NEGATIVE Final    Comment: (NOTE) The Xpert Xpress SARS-CoV-2/FLU/RSV plus assay is intended as an aid in the diagnosis of influenza from Nasopharyngeal swab specimens and should not be used as a sole basis for treatment. Nasal washings and aspirates are unacceptable for Xpert Xpress SARS-CoV-2/FLU/RSV testing.  Fact Sheet for Patients: EntrepreneurPulse.com.au  Fact Sheet for Healthcare Providers: IncredibleEmployment.be  This test is not yet approved or cleared by the [place] FDA and has been authorized for detection and/or diagnosis of SARS-CoV-2 by FDA under an Emergency Use Authorization (EUA). This EUA will remain in effect (meaning this test can be used) for the duration of the COVID-19 declaration under Section 564(b)(1) of the Act, 21 U.S.C. section 360bbb-3(b)(1), unless the authorization is terminated or revoked.  Performed at [facility], [place] [address] [place], [place] [address]      Labs: Basic Metabolic Panel: Recent Labs  Lab 02/09/21 1026 02/10/21 0337 02/11/21 0343 02/12/21 0350  NA 134* 138 139 139  K 3.6 4.5 4.1 4.1  CL 96* 99 101 103  CO2 25 30 31 31   GLUCOSE 126* 134* 104* 91  BUN 17 24* 26* 22  CREATININE 0.85 0.71 0.67 0.61  CALCIUM 8.8* 8.9 8.9 8.6*  MG  --   --  2.8*  --    Liver Function Tests: Recent Labs  Lab 02/09/21 1026 02/10/21 0337 02/11/21 0343 02/12/21 0350  AST 48* 31 110* 70*  ALT 31 27 85* 90*  ALKPHOS 89 70 71 70  BILITOT 1.5* 0.6 0.5 0.6  PROT 7.8 6.8 6.5 6.2*  ALBUMIN 3.5 2.7*  2.6* 2.6*   No results for input(s): LIPASE, AMYLASE in the last 168 hours. No results for input(s): AMMONIA in the last 168 hours. CBC: Recent Labs  Lab 02/09/21 1026 02/10/21 0337 02/11/21 0343 02/12/21 0350  WBC 27.6* 23.2* 20.3* 15.6*  NEUTROABS 22.1*  --   --  12.2*  HGB 14.5 11.8* 12.2* 12.0*  HCT 45.5 37.9* 38.1* 38.8*  MCV 91.2 91.3 92.3 94.4  PLT 366 282 304 287   Cardiac Enzymes: Recent Labs  Lab 02/12/21 0350  CKTOTAL 58   BNP: BNP (last 3 results) No results for input(s): BNP in the last 8760 hours.  ProBNP (last 3 results) No results for input(s): PROBNP in the last 8760 hours.  CBG: No results for input(s): GLUCAP in the last 168 hours.     Signed:  Florencia Reasons MD, PhD, FACP  [facility] 02/13/2021, 11:47 AM

## 2021-02-13 NOTE — Care Management Important Message (Signed)
Important Message  Patient Details IM Letter given to the Patient. Name: Joe [name] MRN: [id number] Date of Birth: Jul 28, 1945   Medicare Important Message Given:  Yes     Kerin Salen 02/13/2021, 1:41 PM

## 2021-02-13 NOTE — TOC Transition Note (Signed)
Transition of Care [facility]) - CM/SW Discharge Note   Patient Details  Name: Joe [name] MRN: [id number] Date of Birth: 1945/11/26  [facility] CM/SW Contact:  Dessa Phi, RN Phone Number: 02/13/2021, 4:15 PM   Clinical Narrative: spoke to patient about HHPT-agree to [place] able to accept for HHPT. No further CM needs.    Final next level of care: [place] Barriers to Discharge: No Barriers Identified   Patient Goals and CMS Choice Patient states their goals for this hospitalization and ongoing recovery are:: To get better CMS Medicare.gov Compare Post Acute Care list provided to:: Patient Choice offered to / list presented to : NA  Discharge Placement                       Discharge Plan and Services                DME Arranged: Oxygen DME Agency: [employer] Date DME Agency Contacted: 02/13/21 Time DME Agency Contacted: 1120 Representative spoke with at DME Agency: [place]: PT [place]: [place] Date [place]: 02/13/21 Time [place]: 1615 Representative spoke with at [place]: [place] ([place]) Interventions     Readmission Risk Interventions No flowsheet data found.

## 2021-02-13 NOTE — Progress Notes (Signed)
Physical Therapy Treatment Patient Details Name: Joe [name] MRN: [id number] DOB: October 27, 1945 Today's Date: 02/13/2021   History of Present Illness Patient is 75 y.o. male who presented on 11/22 to the ED with worsening dyspnea over the last month but especially over the last several days, home pulse oximetry with saturation in 70s at times. PMH significant for stage IIIb NSCLC, squamous cell carcinoma who originally presented with LUL mass and mediastinal LAD, contralateral R hilar hypermetabolism wh ois s/p chemoXRT now on consolidation IT with imfinzi s/p 13 cycles, CLL dx 2016, COPD, pAF, hypothyroid, GERD, OSA on CPAP.    PT Comments    Patient received in bed, pleasant and cooperative. Able to gait train about 89f in hallway until Spo2 dropped to 84% on room air, needed lots of encouragement and education to replace O2/nasal cannula, but did ultimately require 2LPM O2 to maintain sats in 90s with activity today. Spent a lot of time talking about importance of keeping Spo2 in goal range with and without use of supplemental O2, also progressing exercise and activity and use of pulse ox when he returns home. Continues to decline HHPT and OP PT. Left in bed with all needs met, bed alarm active. Thank you for the opportunity to participate in his care!   Recommendations for follow up therapy are one component of a multi-disciplinary discharge planning process, led by the attending physician.  Recommendations may be updated based on patient status, additional functional criteria and insurance authorization.  Follow Up Recommendations  Home health PT (but refusing this and OP PT)     Assistance Recommended at Discharge Intermittent Supervision/Assistance  Equipment Recommendations  None recommended by PT    Recommendations for Other Services       Precautions / Restrictions Precautions Precautions: Fall Precaution Comments: watch O2 sats Restrictions Weight Bearing Restrictions:  No     Mobility  Bed Mobility Overal bed mobility: Needs Assistance Bed Mobility: Supine to Sit;Sit to Supine     Supine to sit: Supervision;HOB elevated Sit to supine: Supervision;HOB elevated   General bed mobility comments: general S for safety with HOB only minimally elevated    Transfers Overall transfer level: Needs assistance Equipment used: Straight cane Transfers: Sit to/from Stand Sit to Stand: Supervision           General transfer comment: S for safety, extra time and increased effort    Ambulation/Gait Ambulation/Gait assistance: Min guard Gait Distance (Feet): 300 Feet Assistive device: Straight cane Gait Pattern/deviations: Step-through pattern;Wide base of support;Drifts right/left;Trunk flexed Gait velocity: decreased     General Gait Details: slow and steady with SPC, does quickly desat into the 80s on room air and required 2LPM to maintain sats in the 90s with activity; needed lots of education and encouragement for O2 wear   Stairs             Wheelchair Mobility    Modified Rankin (Stroke Patients Only)       Balance Overall balance assessment: Needs assistance Sitting-balance support: Feet supported Sitting balance-Leahy Scale: Good     Standing balance support: Single extremity supported;During functional activity;Reliant on assistive device for balance Standing balance-Leahy Scale: Good                              Cognition Arousal/Alertness: Awake/alert Behavior During Therapy: WFL for tasks assessed/performed Overall Cognitive Status: Within Functional Limits for tasks assessed  Exercises      General Comments General comments (skin integrity, edema, etc.): SpO2 drop to 84% on RA, needed 2LPM O2 to maintain sats in 90s      Pertinent Vitals/Pain Pain Assessment: No/denies pain Pain Score: 0-No pain    Home Living                           Prior Function            PT Goals (current goals can now be found in the care plan section) Acute Rehab PT Goals Patient Stated Goal: get recovered and back home PT Goal Formulation: With patient Time For Goal Achievement: 02/25/21 Potential to Achieve Goals: Good Progress towards PT goals: Progressing toward goals    Frequency    Min 3X/week      PT Plan Current plan remains appropriate    Co-evaluation              AM-PAC PT "6 Clicks" Mobility   Outcome Measure  Help needed turning from your back to your side while in a flat bed without using bedrails?: None Help needed moving from lying on your back to sitting on the side of a flat bed without using bedrails?: None Help needed moving to and from a bed to a chair (including a wheelchair)?: A Little Help needed standing up from a chair using your arms (e.g., wheelchair or bedside chair)?: A Little Help needed to walk in hospital room?: A Little Help needed climbing 3-5 steps with a railing? : A Lot 6 Click Score: 19    End of Session Equipment Utilized During Treatment: Oxygen;Gait belt Activity Tolerance: Patient tolerated treatment well Patient left: in bed;with call bell/phone within reach;with bed alarm set Nurse Communication: Mobility status PT Visit Diagnosis: Unsteadiness on feet (R26.81);Difficulty in walking, not elsewhere classified (R26.2);Muscle weakness (generalized) (M62.81)     Time: 7902-4097 PT Time Calculation (min) (ACUTE ONLY): 26 min  Charges:  $Gait Training: 8-22 mins $Self Care/Home Management: 8-22                    Windell Norfolk, DPT, PN2   Supplemental Physical Therapist [place]    Pager [phone] Acute Rehab Office [phone]

## 2021-02-13 NOTE — Progress Notes (Signed)
Reviewed written d/c instructions w pt and all questions answered. He verbalized understanding. D/ C via w/c w all belongings in stable condition.

## 2021-02-16 ENCOUNTER — Telehealth: Payer: Self-pay

## 2021-02-16 ENCOUNTER — Telehealth: Payer: Self-pay | Admitting: Student

## 2021-02-16 ENCOUNTER — Ambulatory Visit (HOSPITAL_COMMUNITY): Payer: Medicare Other

## 2021-02-16 ENCOUNTER — Other Ambulatory Visit: Payer: Medicare Other

## 2021-02-16 NOTE — Telephone Encounter (Signed)
Called patient but he did not answer. Left message for him to call back.  

## 2021-02-16 NOTE — Telephone Encounter (Addendum)
Transition Care Management Follow-up Telephone Call Date of discharge and from where: 02/13/21 [facility]  How have you been since you were released from the hospital? ok Any questions or concerns? No  Items Reviewed: Did the pt receive and understand the discharge instructions provided? Yes  Medications obtained and verified? Yes  Other? No  Any new allergies since your discharge? Yes  Dietary orders reviewed? Yes Do you have support at home? Yes   Home Care and Equipment/Supplies: Were home health services ordered? Yes  If so, what is the name of the agency?   Has the agency set up a time to come to the patient's home? [facility] Were any new equipment or medical supplies ordered?  Yes  What is the name of the medical supply agency? [employer] [facility]  Were you able to get the supplies/equipment? Pt has O2 at home portable, awaiting smaller tank from [employer]  Do you have any questions related to the use of the equipment or supplies? No  Functional Questionnaire: (I = Independent and D = Dependent) ADLs: I  Bathing/Dressing- I  Meal Prep- I  Eating- I  Maintaining continence- I  Transferring/Ambulation- I  Managing Meds- I  Follow up appointments reviewed:  PCP Hospital f/u appt confirmed? Yes  Scheduled to see  on Dr Alain Marion  @ 9:30 on 02/24/21. [facility] f/u appt confirmed? Yes  Scheduled to see Pulmonary  on 03/04/21 @ 10:00. Are transportation arrangements needed? No  If their condition worsens, is the pt aware to call PCP or go to the Emergency Dept.? Yes Was the patient provided with contact information for the PCP's office or ED? Yes Was to pt encouraged to call back with questions or concerns? Yes  Medical screening examination/treatment/procedure(s) were performed by non-physician practitioner and as supervising physician I was immediately available for consultation/collaboration.  I agree with above. Lew Dawes, MD

## 2021-02-17 NOTE — Telephone Encounter (Signed)
LMTCB

## 2021-02-17 NOTE — Telephone Encounter (Signed)
I think that is reasonable during that visit. Did you have a question for me otherwise?

## 2021-02-18 ENCOUNTER — Ambulatory Visit: Payer: Medicare Other | Admitting: Internal Medicine

## 2021-02-18 DIAGNOSIS — K219 Gastro-esophageal reflux disease without esophagitis: Secondary | ICD-10-CM | POA: Diagnosis not present

## 2021-02-18 DIAGNOSIS — E785 Hyperlipidemia, unspecified: Secondary | ICD-10-CM | POA: Diagnosis not present

## 2021-02-18 DIAGNOSIS — Z9181 History of falling: Secondary | ICD-10-CM | POA: Diagnosis not present

## 2021-02-18 DIAGNOSIS — J449 Chronic obstructive pulmonary disease, unspecified: Secondary | ICD-10-CM | POA: Diagnosis not present

## 2021-02-18 DIAGNOSIS — I2510 Atherosclerotic heart disease of native coronary artery without angina pectoris: Secondary | ICD-10-CM | POA: Diagnosis not present

## 2021-02-18 DIAGNOSIS — F411 Generalized anxiety disorder: Secondary | ICD-10-CM | POA: Diagnosis not present

## 2021-02-18 DIAGNOSIS — Z9981 Dependence on supplemental oxygen: Secondary | ICD-10-CM | POA: Diagnosis not present

## 2021-02-18 DIAGNOSIS — E039 Hypothyroidism, unspecified: Secondary | ICD-10-CM | POA: Diagnosis not present

## 2021-02-18 DIAGNOSIS — C9110 Chronic lymphocytic leukemia of B-cell type not having achieved remission: Secondary | ICD-10-CM | POA: Diagnosis not present

## 2021-02-18 DIAGNOSIS — I480 Paroxysmal atrial fibrillation: Secondary | ICD-10-CM | POA: Diagnosis not present

## 2021-02-18 DIAGNOSIS — F32A Depression, unspecified: Secondary | ICD-10-CM | POA: Diagnosis not present

## 2021-02-18 DIAGNOSIS — J9601 Acute respiratory failure with hypoxia: Secondary | ICD-10-CM | POA: Diagnosis not present

## 2021-02-18 DIAGNOSIS — G4733 Obstructive sleep apnea (adult) (pediatric): Secondary | ICD-10-CM | POA: Diagnosis not present

## 2021-02-18 DIAGNOSIS — K760 Fatty (change of) liver, not elsewhere classified: Secondary | ICD-10-CM | POA: Diagnosis not present

## 2021-02-18 DIAGNOSIS — I719 Aortic aneurysm of unspecified site, without rupture: Secondary | ICD-10-CM | POA: Diagnosis not present

## 2021-02-18 DIAGNOSIS — J8489 Other specified interstitial pulmonary diseases: Secondary | ICD-10-CM | POA: Diagnosis not present

## 2021-02-18 DIAGNOSIS — C3491 Malignant neoplasm of unspecified part of right bronchus or lung: Secondary | ICD-10-CM | POA: Diagnosis not present

## 2021-02-18 DIAGNOSIS — I10 Essential (primary) hypertension: Secondary | ICD-10-CM | POA: Diagnosis not present

## 2021-02-18 DIAGNOSIS — Z7901 Long term (current) use of anticoagulants: Secondary | ICD-10-CM | POA: Diagnosis not present

## 2021-02-23 ENCOUNTER — Telehealth: Payer: Self-pay | Admitting: Medical Oncology

## 2021-02-23 ENCOUNTER — Other Ambulatory Visit: Payer: Self-pay | Admitting: Physician Assistant

## 2021-02-23 DIAGNOSIS — E039 Hypothyroidism, unspecified: Secondary | ICD-10-CM

## 2021-02-23 NOTE — Telephone Encounter (Signed)
CT angio done 11/21 as inpt. Sent schedule message for labs and f/u.  Pt aware.

## 2021-02-23 NOTE — Progress Notes (Signed)
Joe [name] OFFICE PROGRESS NOTE  Joe [name], [name], MD [place] [id number]  DIAGNOSIS:  1) Stage IIIb non-small cell lung cancer, squamous cell carcinoma.  The patient presented with a left upper lobe lung mass as well as associated mediastinal lymphadenopathy and contralateral right hilar mild hypermetabolism.  There was heterogeneous marrow uptake in the spine but with more focal areas at L4 without imaging correlation on CT scan.  The patient was diagnosed in August 2021. 2) CLL stage 0, diagnosed in 2016   PD-L1 expression: 2%  PRIOR THERAPY:  1) Concurrent chemoradiation with carboplatin for an AUC of 2 and paclitaxel 45 mg per metered squared weekly.  First dose expected on 12/13/2019. Status post 5 cycles.   Last dose was giving January 14, 2020. 2) Consolidation treatment with immunotherapy with Imfinzi 1500 mg IV every 4 weeks.  First dose February 19, 2020.  Status post 12 cycles. Last dose on 01/19/21  CURRENT THERAPY: Observation   INTERVAL HISTORY: Joe [name] 75 y.o. male returns to the clinic today for a follow-up visit.  The patient was last seen in the clinic on 01/19/2021.  The patient was feeling well at that time and underwent his last cycle of consolidation immunotherapy with Imfinzi.  The plan was to undergo a restaging CT scan in 4 weeks and a follow-up.  Unfortunately, the patient presented to the emergency room on 02/09/2021 for the chief complaint of shortness of breath.  The patient was admitted from 02/13/2021.  Upon admission, the patient's oxygen saturations were in the 80s at home.  He received Solu-Medrol and albuterol nebulizers.  He had a low-grade fever as well.  The patient had a CTA which was negative for PE.  There was some increase in the mid and lower lobe zones and predominant subpleural nodularity and consolidations which was suspicious for immunotherapy related organizing pneumonia.  There was also some  posttreatment collapse and consolidation of the lower lobe.  Sputum cultures were unable to be collected.  The patient was seen by pulmonology who recommended antibiotics which was completed in the hospital. He is also on prednisone taper. He started taking 40 mg daily today. They recommended a prednisone taper and he is scheduled for a repeat CT scan in March 31, 2021 for follow-up.  He met criteria for home oxygen and was discharged home on supplemental oxygen. He has a follow up with pulmonology on 03/04/21.   He is undergoing physical therapy at home.  Since being discharged from the hospital, the patient's cough has improved. He still has some shortness of breath and is wearing oxygen. He uses a CPAP at night. He denies any fever, chills, night sweats, weight loss.  Denies any chest pain or hemoptysis.  He denies any nausea, vomiting, diarrhea, or constipation. He notes some urinary leakage with coughing prior to his hospital admission which is improving but still occurs. He wears an adult diaper for this. Denies any headache or visual changes.  The patient is here today for evaluation and more detailed discussion about his treatment plan moving forward.      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  MEDICATIONS:  Current Outpatient Medications  Medication Sig Dispense Refill   amLODipine (NORVASC) 5 MG tablet Take 1 tablet (5 mg total) by mouth every evening. Please hold this medication for now, please check your blood pressure daily in the morning, bring in record for your pcp to review. Your pcp and you can decide on resumption or discontinuation of this medication     atenolol (TENORMIN) 25 MG tablet TAKE 1 TABLET BY MOUTH DAILY AS NEEDED FOR PALPITATIONS (Patient taking differently: Take 25 mg by mouth daily as needed (palpitations).) 90 tablet 3   Cholecalciferol 25 MCG (1000 UT) tablet Take 1,000 Units by mouth daily.     clotrimazole-betamethasone (LOTRISONE) cream Apply topically 2 (two) times daily. 45 g 2   diazepam (VALIUM) 5 MG tablet TAKE 1 TABLET(5 MG) BY MOUTH EVERY 12 HOURS (Patient taking differently: Take 5 mg by mouth every 12 (twelve) hours as needed for anxiety.) 180 tablet 1   ELIQUIS 5 MG TABS tablet TAKE 1 TABLET(5 MG) BY MOUTH TWICE DAILY (Patient taking differently: Take 5 mg by mouth 2 (two) times daily.) 180 tablet 1   famotidine (PEPCID) 20 MG tablet Take 1 tablet (20 mg total) by mouth at bedtime. 30 tablet 1   Fluticasone-Umeclidin-Vilant (TRELEGY ELLIPTA) 100-62.5-25 MCG/ACT AEPB Inhale 1  puff into the lungs daily. 1 each 11   guaiFENesin (MUCINEX) 600 MG 12 hr tablet Take 1 tablet (600 mg total) by mouth 2 (two) times daily.     levothyroxine (SYNTHROID) 112 MCG tablet TAKE 1 TABLET(112 MCG) BY MOUTH DAILY 30 tablet 2   lovastatin (MEVACOR) 20 MG tablet TAKE 1 TABLET BY MOUTH EVERY NIGHT AT BEDTIME (Patient taking differently: Take 20 mg by mouth at bedtime.) 90 tablet 2   Multiple Vitamin (MULTIVITAMIN) tablet Take 1 tablet by mouth daily. Centrum Silver.     Polyethyl Glycol-Propyl Glycol (SYSTANE) 0.4-0.3 % SOLN Place 1 drop into both eyes daily as needed (Dry eyes). Ultra     predniSONE (DELTASONE) 10 MG tablet Label  & dispense according to the schedule  below. Please take prednisone with breakfast daily until stopped. Take  6 Pills PO daily for three days then, 5 Pills PO daily for 7 days, then 4 Pills PO daily  for 7 days, then  3Pills PO daily for 7 days,then 2 Pills PO daily for 7 days, 1 Pills PO daily for 7 days,  then STOP , further instruction regarding prednisone to be determined by your pulmonologist Dr Verlee Monte. Total of 123 tabs 123 tablet 0   prochlorperazine (COMPAZINE) 10 MG tablet Take 1 tablet (10 mg total) by mouth every 6 (six) hours as needed. 30 tablet 2   No current facility-administered medications for this visit.    SURGICAL HISTORY:  Past Surgical History:  Procedure Laterality Date   BICEPS TENDON REPAIR     BRONCHIAL BIOPSY  10/23/2019   Procedure: BRONCHIAL BIOPSIES;  Surgeon: [name], MD;  Location: [facility] ENDOSCOPY;  Service: Pulmonary;;   BRONCHIAL BIOPSY  11/20/2019   Procedure: BRONCHIAL BIOPSIES;  Surgeon: [name], MD;  Location: [facility] ENDOSCOPY;  Service: Pulmonary;;   BRONCHIAL BRUSHINGS  10/23/2019   Procedure: BRONCHIAL BRUSHINGS;  Surgeon: [name], MD;  Location: [facility] ENDOSCOPY;  Service: Pulmonary;;   BRONCHIAL BRUSHINGS  11/20/2019   Procedure: BRONCHIAL BRUSHINGS;  Surgeon: [name], MD;  Location: [facility] ENDOSCOPY;   Service: Pulmonary;;   BRONCHIAL NEEDLE ASPIRATION BIOPSY  10/23/2019   Procedure: BRONCHIAL NEEDLE ASPIRATION BIOPSIES;  Surgeon: [name], MD;  Location: [facility] ENDOSCOPY;  Service: Pulmonary;;   BRONCHIAL NEEDLE ASPIRATION BIOPSY  11/20/2019   Procedure: BRONCHIAL NEEDLE ASPIRATION BIOPSIES;  Surgeon: [name], MD;  Location: [facility] ENDOSCOPY;  Service: Pulmonary;;   BRONCHIAL WASHINGS  11/20/2019   Procedure: BRONCHIAL WASHINGS;  Surgeon: [name], MD;  Location: [place];  Service: Pulmonary;;   CATARACT EXTRACTION Left    COLONOSCOPY  04-14-99   Dr Flossie Dibble polyp-TA in epic   POLYPECTOMY  04-14-99   POSTERIOR LAMINECTOMY / DECOMPRESSION CERVICAL SPINE     Dr Saintclair Halsted   RETINAL DETACHMENT SURGERY     left eye, 2007 x2, 2008 x 3   ROTATOR CUFF REPAIR  2004   right   TONSILLECTOMY  [id number]   VIDEO BRONCHOSCOPY WITH ENDOBRONCHIAL NAVIGATION N/A 10/23/2019   Procedure: VIDEO BRONCHOSCOPY WITH ENDOBRONCHIAL NAVIGATION;  Surgeon: [name], MD;  Location: [facility] ENDOSCOPY;  Service: Pulmonary;  Laterality: N/A;   VIDEO BRONCHOSCOPY WITH ENDOBRONCHIAL NAVIGATION N/A 11/20/2019   Procedure: VIDEO BRONCHOSCOPY WITH ENDOBRONCHIAL NAVIGATION;  Surgeon: [name], MD;  Location: [place] ENDOSCOPY;  Service: Pulmonary;  Laterality: N/A;   VIDEO BRONCHOSCOPY WITH ENDOBRONCHIAL ULTRASOUND N/A 10/23/2019   Procedure: VIDEO BRONCHOSCOPY WITH ENDOBRONCHIAL ULTRASOUND;  Surgeon: [name], MD;  Location: [place] ENDOSCOPY;  Service: Pulmonary;  Laterality: N/A;    REVIEW OF SYSTEMS:   Review of Systems  Constitutional: Positive for fatigue. Negative for appetite change, chills, fever and unexpected weight change.  HENT: Negative for mouth sores, nosebleeds, sore throat and trouble swallowing.   Eyes: Negative for eye problems and icterus.  Respiratory: Positive for shortness of breath. Negative for cough, hemoptysis,  and wheezing.   Cardiovascular: Negative for chest pain and leg  swelling.  Gastrointestinal: Negative for abdominal pain, constipation, diarrhea, nausea and vomiting.  Genitourinary: Negative for bladder incontinence, difficulty urinating, dysuria, frequency and hematuria.   Musculoskeletal: Negative for back pain, gait problem, neck pain and neck stiffness.  Skin: Negative for itching and rash.  Neurological: Negative for dizziness, extremity weakness,  gait problem, headaches, light-headedness and seizures.  Hematological: Negative for adenopathy. Does not bruise/bleed easily.  Psychiatric/Behavioral: Negative for confusion, depression and sleep disturbance. The patient is not nervous/anxious.     PHYSICAL EXAMINATION:  Blood pressure (!) 144/77, pulse 60, temperature 97.9 F (36.6 C), temperature source Tympanic, resp. rate 18, height 5' 11" (1.803 m), weight 182 lb 3.2 oz (82.6 kg), SpO2 98 %.  ECOG PERFORMANCE STATUS: 1  Physical Exam  Constitutional: Oriented to person, place, and time and well-developed, well-nourished, and in no distress.  HENT: Head: Normocephalic and atraumatic. Mouth/Throat: Oropharynx is clear and moist. No oropharyngeal exudate. Eyes: Conjunctivae are normal. Right eye exhibits no discharge. Left eye exhibits no discharge. No scleral icterus. Neck: Normal range of motion. Neck supple. Cardiovascular: Normal rate, regular rhythm, normal heart sounds and intact distal pulses.   Pulmonary/Chest: Effort normal. Quiet breath sounds in all lung fields. No respiratory distress. No wheezes. No rales. On supplemental oxygen.  Abdominal: Soft. Bowel sounds are normal. Exhibits no distension and no mass. There is no tenderness.  Musculoskeletal: Normal range of motion. Exhibits no edema.  Lymphadenopathy:    No cervical adenopathy.  Neurological: Alert and oriented to person, place, and time. Exhibits normal muscle tone. Gait normal. Coordination normal. Ambulates with a cane.  Skin: Positive for fleas. Skin is warm and dry. No  rash noted. Not diaphoretic. No erythema. No pallor.  Psychiatric: Mood, memory and judgment normal. Vitals reviewed.  LABORATORY DATA: Lab Results  Component Value Date   WBC 19.3 (H) 02/24/2021   HGB 14.7 02/24/2021   HCT 46.5 02/24/2021   MCV 89.9 02/24/2021   PLT 334 02/24/2021      Chemistry      Component Value Date/Time   NA 136 02/24/2021 1337   NA 143 03/10/2017 0911   K 4.1 02/24/2021 1337   K 4.7 03/10/2017 0911   CL 100 02/24/2021 1337   CO2 28 02/24/2021 1337   CO2 30 (H) 03/10/2017 0911   BUN 12 02/24/2021 1337   BUN 10.7 03/10/2017 0911   CREATININE 0.99 02/24/2021 1337   CREATININE 0.96 02/24/2021 1029   CREATININE 0.8 03/10/2017 0911      Component Value Date/Time   CALCIUM 9.4 02/24/2021 1337   CALCIUM 9.4 03/10/2017 0911   ALKPHOS 75 02/24/2021 1337   ALKPHOS 76 03/10/2017 0911   AST 24 02/24/2021 1337   AST 28 02/24/2021 1029   AST 34 03/10/2017 0911   ALT 41 02/24/2021 1337   ALT 38 02/24/2021 1029   ALT 45 03/10/2017 0911   BILITOT 1.9 (H) 02/24/2021 1337   BILITOT 1.7 (H) 02/24/2021 1029   BILITOT 1.41 (H) 03/10/2017 0911       RADIOGRAPHIC STUDIES:  CT Angio Chest PE W and/or Wo Contrast  Result Date: 02/09/2021 CLINICAL DATA:  Worsening shortness of breath, low O2 sats, lung cancer staging. Chemo radiation completed. Last immunotherapy 3 weeks ago. EXAM: CT ANGIOGRAPHY CHEST WITH CONTRAST TECHNIQUE: Multidetector CT imaging of the chest was performed using the standard protocol during bolus administration of intravenous contrast. Multiplanar CT image reconstructions and MIPs were obtained to evaluate the vascular anatomy. CONTRAST:  43m OMNIPAQUE IOHEXOL 350 MG/ML SOLN COMPARISON:  10/24/2020. FINDINGS: Cardiovascular: Negative for pulmonary embolus. Atherosclerotic calcification of the aorta, aortic valve and coronary arteries. Ascending aorta measures up to 4.1 cm (coronal series 7, image 53). Heart is at the upper limits of normal in  size. No pericardial effusion. Mediastinum/Nodes: Mediastinal and hilar lymph nodes are  not enlarged by CT size criteria. No axillary adenopathy. Esophagus is grossly unremarkable. Lungs/Pleura: Centrilobular emphysema. Post treatment collapse/consolidation in the left upper lobe, unchanged. Increasing subpleural nodularity and ground-glass in the mid and lower lung zones. No pleural fluid. Airway is unremarkable. Upper Abdomen: Visualized portion of the liver is decreased in attenuation diffusely. Stones in the gallbladder. Visualized portions of the adrenal glands, kidneys, spleen, pancreas, stomach and bowel are grossly unremarkable. Periportal lymph nodes measure up to 1.8 cm (4/160) and are likely reactive. Musculoskeletal: Degenerative changes in the spine. No worrisome lytic or sclerotic lesions. Old left rib fractures. Review of the MIP images confirms the above findings. IMPRESSION: 1. Negative for pulmonary embolus. 2. 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 3. Post treatment collapse/consolidation in the left upper lobe, stable. No evidence of progressive disease. 4. Hepatic steatosis. 5. Cholelithiasis. 6. Ascending aortic aneurysm, stable. Recommend annual imaging followup by CTA or MRA. This recommendation follows 2010 ACCF/AHA/AATS/ACR/ASA/SCA/SCAI/SIR/STS/SVM Guidelines for the Diagnosis and Management of Patients with Thoracic Aortic Disease. Circulation. 2010; 121: S923-R007. Aortic aneurysm NOS (ICD10-I71.9). 7. Aortic atherosclerosis (ICD10-I70.0). Coronary artery calcification. 8.  Emphysema (ICD10-J43.9). Electronically Signed   By: Lorin Picket M.D.   On: 02/09/2021 12:50   DG Chest Portable 1 View  Result Date: 02/09/2021 CLINICAL DATA:  Shortness of breath EXAM: PORTABLE CHEST 1 VIEW COMPARISON:  Previous studies including the CT chest done on 10/24/2020 FINDINGS:  Cardiac size is within normal limits. There is decreased volume in the left lung. Low position of diaphragms may suggest COPD. Small patchy infiltrate is seen in the left apex. Overall, no significant interval changes are noted in the infiltrate in the left upper lung fields in comparison with the CT done on 10/24/2020. Rest of the lung fields are clear. There is no significant pleural effusion or pneumothorax. IMPRESSION: Small patchy infiltrate is seen in left upper lung fields with no significant interval change. There are no signs of alveolar pulmonary edema or new focal infiltrates. Electronically Signed   By: Elmer Picker M.D.   On: 02/09/2021 11:22     ASSESSMENT/PLAN:  This is a very pleasant 75 year old Caucasian male referred to the clinic for evaluation of stage IIIb non-small cell lung cancer, squamous cell carcinoma.  The patient presented with a left upper lobe lung mass as well as associated mediastinal lymphadenopathy and contralateral right hilar mild hypermetabolism.  There was heterogeneous marrow uptake in general in the spine but with more focal areas on the L4 level without imaging correlation on CT.  His PDL1 expression is 2%. He was diagnosed in August 2021. The patient also has a  stage 0 CLL.  He underwent a course of concurrent chemoradiation with weekly carboplatin for AUC of 2 and paclitaxel 45 mg/M2 status post 5 cycles of the treatment.  He tolerated this treatment well with no concerning adverse effect except for mild odynophagia and dysphagia as well as thrombocytopenia.   The patient is currently undergoing consolidation treatment with immunotherapy with Imfinzi 1500 mg IV every 4 weeks status post 12 cycles. He tolerated this well.  He completed this in October 2022.  The patient was recently hospitalized in November 2022 for possible pneumonia vs pneumonitis would could be immunotherapy related.   The patient had a CT angiogram performed in the hospital which  was suspicious for pneumonia vs pneumonitis. There was no evidence for disease progression. The patient was seen with Dr. Julien Nordmann today.  Labs reviewed.  Dr. Julien Nordmann personally and independently reviewed his CT scan in the hospital. Dr. Julien Nordmann recommends that the patient continue on observation with restaging CT scan in 3 months after his follow up CT scan by pulmonology which is scheduled for 03/31/2021.   We will see him back for follow-up visit in 4 months for evaluation and to review his scan results.  He completed antibiotics for possible pneumonia. He is currently on prednisone for possible pneumonitis.   His WBC is elevated today due to his steroid use and stage 0 CLL.   The patient was advised to call immediately if he has any concerning symptoms in the interval. The patient voices understanding of current disease status and treatment options and is in agreement with the current care plan. All questions were answered. The patient knows to call the clinic with any problems, questions or concerns. We can certainly see the patient much sooner if necessary            Orders Placed This Encounter  Procedures   CT Chest W Contrast    Standing Status:   Future    Standing Expiration Date:   02/24/2022    Order Specific Question:   If indicated for the ordered procedure, I authorize the administration of contrast media per Radiology protocol    Answer:   Yes    Order Specific Question:   Preferred imaging location?    Answer:   [facility]   CBC with Differential ([place] Only)    Standing Status:   Future    Standing Expiration Date:   02/24/2022   CMP ([place] only)    Standing Status:   Future    Standing Expiration Date:   02/24/2022       Tobe Sos Heilingoetter, PA-C 02/24/21  ADDENDUM: Hematology/Oncology Attending: I had a face-to-face encounter with the patient today.  I reviewed his record, lab and scan and recommended his care plan.  This  is a very pleasant 75 years old white male with history of a stage IIIb non-small cell lung cancer, squamous cell carcinoma diagnosed in August 2021 status post a course of concurrent chemoradiation followed by consolidation treatment with immunotherapy with Imfinzi every 4 weeks for a total of 13 cycles and he tolerated his treatment well.  The patient was admitted to the hospital around 2 weeks ago with worsening shortness of breath and CT of the chest showed suspicious pneumonia/immunotherapy mediated pneumonitis.  He was treated with antibiotics as well as a tapered dose of his steroids and he is feeling a little bit better.  The scan showed no concerning findings for disease progression. I had a lengthy discussion with the patient today about his current condition and treatment options. I recommended for him to continue with the taper dose of prednisone. We will see him back for follow-up visit in around 4 months with repeat CT scan of the chest for restaging of his disease. He was advised to call immediately if he has any other concerning symptoms in the interval. The total time spent in the appointment was 30 minutes. Disclaimer: This note was dictated with voice recognition software. Similar sounding words can inadvertently be transcribed and may be missed upon review. Eilleen Kempf, MD 02/24/21

## 2021-02-24 ENCOUNTER — Ambulatory Visit (INDEPENDENT_AMBULATORY_CARE_PROVIDER_SITE_OTHER): Payer: Medicare Other | Admitting: Internal Medicine

## 2021-02-24 ENCOUNTER — Telehealth: Payer: Self-pay

## 2021-02-24 ENCOUNTER — Inpatient Hospital Stay (HOSPITAL_BASED_OUTPATIENT_CLINIC_OR_DEPARTMENT_OTHER): Payer: Medicare Other | Admitting: Physician Assistant

## 2021-02-24 ENCOUNTER — Inpatient Hospital Stay: Payer: Medicare Other | Attending: Hematology

## 2021-02-24 ENCOUNTER — Encounter: Payer: Self-pay | Admitting: Internal Medicine

## 2021-02-24 ENCOUNTER — Other Ambulatory Visit: Payer: Self-pay

## 2021-02-24 VITALS — BP 120/60 | HR 54 | Temp 97.9°F | Ht 71.0 in | Wt 195.0 lb

## 2021-02-24 VITALS — BP 144/77 | HR 60 | Temp 97.9°F | Resp 18 | Ht 71.0 in | Wt 182.2 lb

## 2021-02-24 DIAGNOSIS — R590 Localized enlarged lymph nodes: Secondary | ICD-10-CM | POA: Diagnosis not present

## 2021-02-24 DIAGNOSIS — J441 Chronic obstructive pulmonary disease with (acute) exacerbation: Secondary | ICD-10-CM | POA: Diagnosis not present

## 2021-02-24 DIAGNOSIS — E039 Hypothyroidism, unspecified: Secondary | ICD-10-CM

## 2021-02-24 DIAGNOSIS — C3412 Malignant neoplasm of upper lobe, left bronchus or lung: Secondary | ICD-10-CM | POA: Insufficient documentation

## 2021-02-24 DIAGNOSIS — C9110 Chronic lymphocytic leukemia of B-cell type not having achieved remission: Secondary | ICD-10-CM | POA: Diagnosis not present

## 2021-02-24 DIAGNOSIS — C3492 Malignant neoplasm of unspecified part of left bronchus or lung: Secondary | ICD-10-CM

## 2021-02-24 DIAGNOSIS — C3490 Malignant neoplasm of unspecified part of unspecified bronchus or lung: Secondary | ICD-10-CM

## 2021-02-24 DIAGNOSIS — R7989 Other specified abnormal findings of blood chemistry: Secondary | ICD-10-CM

## 2021-02-24 DIAGNOSIS — J449 Chronic obstructive pulmonary disease, unspecified: Secondary | ICD-10-CM

## 2021-02-24 DIAGNOSIS — I10 Essential (primary) hypertension: Secondary | ICD-10-CM | POA: Insufficient documentation

## 2021-02-24 DIAGNOSIS — Z87891 Personal history of nicotine dependence: Secondary | ICD-10-CM | POA: Insufficient documentation

## 2021-02-24 LAB — CBC WITH DIFFERENTIAL (CANCER CENTER ONLY)
Abs Immature Granulocytes: 0.07 10*3/uL (ref 0.00–0.07)
Basophils Absolute: 0 10*3/uL (ref 0.0–0.1)
Basophils Relative: 0 %
Eosinophils Absolute: 0 10*3/uL (ref 0.0–0.5)
Eosinophils Relative: 0 %
HCT: 46.5 % (ref 39.0–52.0)
Hemoglobin: 14.7 g/dL (ref 13.0–17.0)
Immature Granulocytes: 0 %
Lymphocytes Relative: 20 %
Lymphs Abs: 3.9 10*3/uL (ref 0.7–4.0)
MCH: 28.4 pg (ref 26.0–34.0)
MCHC: 31.6 g/dL (ref 30.0–36.0)
MCV: 89.9 fL (ref 80.0–100.0)
Monocytes Absolute: 1.1 10*3/uL — ABNORMAL HIGH (ref 0.1–1.0)
Monocytes Relative: 6 %
Neutro Abs: 14.2 10*3/uL — ABNORMAL HIGH (ref 1.7–7.7)
Neutrophils Relative %: 74 %
Platelet Count: 334 10*3/uL (ref 150–400)
RBC: 5.17 MIL/uL (ref 4.22–5.81)
RDW: 14.4 % (ref 11.5–15.5)
WBC Count: 19.3 10*3/uL — ABNORMAL HIGH (ref 4.0–10.5)
nRBC: 0 % (ref 0.0–0.2)

## 2021-02-24 LAB — CMP (CANCER CENTER ONLY)
ALT: 41 U/L (ref 0–44)
AST: 24 U/L (ref 15–41)
Albumin: 3.7 g/dL (ref 3.5–5.0)
Alkaline Phosphatase: 75 U/L (ref 38–126)
Anion gap: 8 (ref 5–15)
BUN: 12 mg/dL (ref 8–23)
CO2: 28 mmol/L (ref 22–32)
Calcium: 9.4 mg/dL (ref 8.9–10.3)
Chloride: 100 mmol/L (ref 98–111)
Creatinine: 0.99 mg/dL (ref 0.61–1.24)
GFR, Estimated: >60 mL/min (ref >60)
Glucose, Bld: 95 mg/dL (ref 70–99)
Potassium: 4.1 mmol/L (ref 3.5–5.1)
Sodium: 136 mmol/L (ref 135–145)
Total Bilirubin: 1.9 mg/dL — ABNORMAL HIGH (ref 0.3–1.2)
Total Protein: 6.9 g/dL (ref 6.5–8.1)

## 2021-02-24 LAB — CBC WITH DIFFERENTIAL/PLATELET
Basophils Absolute: 0 10*3/uL (ref 0.0–0.1)
Basophils Relative: 0.2 % (ref 0.0–3.0)
Eosinophils Absolute: 0 10*3/uL (ref 0.0–0.7)
Eosinophils Relative: 0.2 % (ref 0.0–5.0)
HCT: 47.2 % (ref 39.0–52.0)
Hemoglobin: 14.8 g/dL (ref 13.0–17.0)
Lymphocytes Relative: 25.6 % (ref 12.0–46.0)
Lymphs Abs: 4.9 10*3/uL — ABNORMAL HIGH (ref 0.7–4.0)
MCHC: 31.4 g/dL (ref 30.0–36.0)
MCV: 89.2 fl (ref 78.0–100.0)
Monocytes Absolute: 1.6 10*3/uL — ABNORMAL HIGH (ref 0.1–1.0)
Monocytes Relative: 8.5 % (ref 3.0–12.0)
Neutro Abs: 12.4 10*3/uL — ABNORMAL HIGH (ref 1.4–7.7)
Neutrophils Relative %: 65.5 % (ref 43.0–77.0)
Platelets: 371 10*3/uL (ref 150.0–400.0)
RBC: 5.29 Mil/uL (ref 4.22–5.81)
RDW: 15.1 % (ref 11.5–15.5)
WBC: 19 10*3/uL (ref 4.0–10.5)

## 2021-02-24 LAB — COMPREHENSIVE METABOLIC PANEL
ALT: 38 U/L (ref 0–53)
AST: 28 U/L (ref 0–37)
Albumin: 4.1 g/dL (ref 3.5–5.2)
Alkaline Phosphatase: 71 U/L (ref 39–117)
BUN: 12 mg/dL (ref 6–23)
CO2: 33 mEq/L — ABNORMAL HIGH (ref 19–32)
Calcium: 9.6 mg/dL (ref 8.4–10.5)
Chloride: 97 mEq/L (ref 96–112)
Creatinine, Ser: 0.96 mg/dL (ref 0.40–1.50)
GFR: 77.62 mL/min (ref >60.00)
Glucose, Bld: 80 mg/dL (ref 70–99)
Potassium: 4.4 mEq/L (ref 3.5–5.1)
Sodium: 136 mEq/L (ref 135–145)
Total Bilirubin: 1.7 mg/dL — ABNORMAL HIGH (ref 0.2–1.2)
Total Protein: 7.2 g/dL (ref 6.0–8.3)

## 2021-02-24 LAB — MAGNESIUM: Magnesium: 2.3 mg/dL (ref 1.5–2.5)

## 2021-02-24 MED ORDER — TRELEGY ELLIPTA 100-62.5-25 MCG/ACT IN AEPB: 1.0000 | INHALATION_SPRAY | Freq: Every day | RESPIRATORY_TRACT | 11 refills | Status: DC

## 2021-02-24 NOTE — Assessment & Plan Note (Addendum)
  Recent RF, exacerbation of COPD On Prednisone now - taper (on 40 mg/d at present) , on O2 2 l/min [place]. Cough is better. F/u w/Dr Verlee Monte

## 2021-02-24 NOTE — Telephone Encounter (Signed)
CRITICAL VALUE STICKER  CRITICAL VALUE: WBC 19.0  RECEIVER (on-site recipient of call): Laurenashley Viar  DATE & TIME NOTIFIED: 02/24/2021, 2:13PM  MESSENGER (representative from lab): [place]  MD NOTIFIED: YES, 2:15PM  RESPONSE:  WILL SPEAK WITH PT

## 2021-02-24 NOTE — Assessment & Plan Note (Signed)
Cont Levothyroxine

## 2021-02-24 NOTE — Assessment & Plan Note (Signed)
Recent RF, exacerbation of COPD On Prednisone now - taper (on 40 mg/d at present) , on O2 2 l/min [place]. Cough is better. F/u w/Dr Verlee Monte

## 2021-02-24 NOTE — Telephone Encounter (Signed)
It is okay.  He is on oral steroids.  Thanks

## 2021-02-24 NOTE — Patient Instructions (Signed)
-  We will see you in April 2023 with a scan. We will see you a few days after the scan to review the results. I have attached the number to radiology scheduling. They should call you to schedule your scan but in case you need to call them, you have their number -Have a wonderful holiday and new year. Call us anytime if you have any concerns.

## 2021-02-24 NOTE — Progress Notes (Signed)
Subjective:  Patient ID: Joe [name], male    DOB: 09-04-1945  Age: 75 y.o. MRN: [id number]  CC: No chief complaint on file.   HPI Joe [name] presents for post-hospital f/u for RF, COPD. On Prednisone now - taper (on 40 mg/d at present) , on O2 2 l/min [place]. Cough is better...  Per hx:  "Admit date: 02/09/2021 Discharge date: 02/13/2021     Recommendations for Outpatient Follow-up:  F/u with PCP within a week  for hospital discharge follow up, repeat cbc/bmp/lft  at follow up F/u with oncology F/u with pulmonology Home health PT, home 02 ordered Addendum: he declined home health PT       Discharge Diagnoses:      Active Hospital Problems    Diagnosis Date Noted   Acute respiratory failure with hypoxia ([place]) 02/09/2021   Organizing pneumonia ([place]) 02/09/2021   Stage III squamous cell carcinoma of left lung ([place]) 11/29/2019   OSA on CPAP     Paroxysmal atrial fibrillation ([place]) 02/06/2019   Hypothyroidism 12/25/2014   CLL (chronic lymphocytic leukemia) ([place]) 08/08/2014   Essential hypertension 07/26/2007   COPD mixed type ([place]) 07/26/2007   Dyslipidemia 12/02/2006     Resolved Hospital Problems  No resolved problems to display.      Discharge Condition: stable   Diet recommendation: heart healthy      Filed Weights    02/09/21 1038  Weight: 88.5 kg      History of present illness: (Per admitting MD Dr. Olevia Bowens)   Chief Complaint: Shortness of breath.   HPI: Joe [name] is a 75 y.o. male with medical history significant of alcohol abuse, hydrologist, cataract, CLL, mixed type COPD, de Quervain's tenosynovitis, depression/GAD, paroxysmal atrial fibrillation on apixaban, gallstones, GERD, gynecomastia, hyperlipidemia, hypertension, hypothyroid [name], intertrigo, OSA on CPAP, history of periorbital cellulitis, history of retinal detachment who is coming to the emergency department due to progressively worse dyspnea and fatigue since Wednesday.  He  has noticed his oxygen saturation readings have been in the 80s at home.  Today he was very dyspneic and his saturations went down to the 70s, Solu-Medrol 125 mg and then 10 mg albuterol continuous neb.  He has had low-grade fever, but no chills, rhinorrhea.  He has a mild sore throat at times from coughing.  He is unable to expectorate phlegm.  Denied chest pain, diaphoresis, PND, orthopnea or pitting edema of the lower extremities.  He has palpitations on occasion and takes atenolol as needed.  No abdominal pain, diarrhea, constipation, melena or hematochezia.  No dysuria, frequency or hematuria.  No polyuria, polydipsia, polyphagia or blurred vision.   ED Course: Initial vital signs were temperature 99.1 F, pulse 94, respiration 20, BP 122/59 mmHg O2 sat 94%.   Lab work: His CBC showed a white count of 27.6 with 80% neutrophils, hemoglobin 14.5 g/dL platelets 366.  Influenza and coronavirus PCR was negative.  CMP with a sodium 134 and chloride 96 mmol/L.  Renal function was normal.  Glucose 126 mg/dL.  AST was 48 units/L and total bilirubin 1.5 mg/dL.  The rest of the LFTs were normal.   Imaging: A 1 view chest radiograph showed small patchy infiltrate in left upper lung field with no significant interval change.  CTA chest was negative for PE.  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  There is posttreatment collapse/consolidation in the left upper lobe.  No  evidence of progressive disease.  Hepatic steatosis.  Cholelithiasis.  Ascending aortic aneurysm which is stable.  Please see images and full radiology report for further details. Hospital Course:  Principal Problem:   Acute respiratory failure with hypoxia (HCC) Active Problems:   Dyslipidemia   Essential hypertension   COPD mixed type (HCC)   CLL (chronic lymphocytic leukemia) (HCC)   Hypothyroidism   Paroxysmal  atrial fibrillation (HCC)   OSA on CPAP   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)   Organizing pneumonia (HCC)   Acute hypoxic respiratory failure -CTA no PE, showed essentially stable lung cancer findings, new findings with increasing mid and lower lung zone predominant subpleural nodularity and consolidation, findings suspicious for immunotherapy related organizing pneumonia -Sputum culture not able to collect, cough is nonproductive, cough appears has improved/resolved at discharge -Seen by pulmonology, recommend abxx5 days which he finished in the hospital,  due to already started on steroid bronchoscopy would be low yield, recommend start with prednisone slow taper, taper by 10 mg each week, (pepcid ordered for him while taking prednisone) , follow-up with pulmonology before complete taper, to repeat CT scan He meets home o2 criteria after home o2 eval, home 02 ordered      LFT elevation for unclear reason Mild, unremarkable ck, monitor volume status  Trending down, repeat lft at hospital discharge follow up   COPD He does not like to use broncodilators  No wheezing   Paroxysmal atrial fibrillation (HCC) CHA?DS?-VASc Score of at least 3. Continue apixaban 5 mg p.o. twice daily. Continue atenolol as needed for palpitation or tachycardia.   Bp low normal without atenolol Hold norvasc, patient is advised to check bp at home, f/u with pcp     Dyslipidemia Continue pravastatin 20 mg p.o. every evening.       CLL (chronic lymphocytic leukemia) (HCC)   Stage III squamous cell carcinoma of left lung [facility]) Oncology informed of the patient's admission. Follow-up after scheduled with Dr. Earlie Server.     Hypothyroidism Continue levothyroxine 112 mcg p.o. daily.     OSA on CPAP Continue CPAP at bedtime.   FTT, PT recommends home health PT, order placed   Nutritional Assessment: The patient's BMI is: Body mass index is 27.21 kg/m."      Outpatient Medications  Prior to Visit  Medication Sig Dispense Refill   amLODipine (NORVASC) 5 MG tablet Take 1 tablet (5 mg total) by mouth every evening. Please hold this medication for now, please check your blood pressure daily in the morning, bring in record for your pcp to review. Your pcp and you can decide on resumption or discontinuation of this medication     atenolol (TENORMIN) 25 MG tablet TAKE 1 TABLET BY MOUTH DAILY AS NEEDED FOR PALPITATIONS (Patient taking differently: Take 25 mg by mouth daily as needed (palpitations).) 90 tablet 3   Cholecalciferol 25 MCG (1000 UT) tablet Take 1,000 Units by mouth daily.     clotrimazole-betamethasone (LOTRISONE) cream Apply topically 2 (two) times daily. 45 g 2   diazepam (VALIUM) 5 MG tablet TAKE 1 TABLET(5 MG) BY MOUTH EVERY 12 HOURS (Patient taking differently: Take 5 mg by mouth every 12 (twelve) hours as needed for anxiety.) 180 tablet 1   ELIQUIS 5 MG TABS tablet TAKE 1 TABLET(5 MG) BY MOUTH TWICE DAILY (Patient taking differently: Take 5 mg by mouth 2 (two) times daily.) 180 tablet 1   famotidine (PEPCID) 20 MG tablet Take 1 tablet (20 mg total) by  mouth at bedtime. 30 tablet 1   guaiFENesin (MUCINEX) 600 MG 12 hr tablet Take 1 tablet (600 mg total) by mouth 2 (two) times daily.     levothyroxine (SYNTHROID) 112 MCG tablet TAKE 1 TABLET(112 MCG) BY MOUTH DAILY 30 tablet 2   lovastatin (MEVACOR) 20 MG tablet TAKE 1 TABLET BY MOUTH EVERY NIGHT AT BEDTIME (Patient taking differently: Take 20 mg by mouth at bedtime.) 90 tablet 2   Multiple Vitamin (MULTIVITAMIN) tablet Take 1 tablet by mouth daily. Centrum Silver.     Polyethyl Glycol-Propyl Glycol (SYSTANE) 0.4-0.3 % SOLN Place 1 drop into both eyes daily as needed (Dry eyes). Ultra     predniSONE (DELTASONE) 10 MG tablet Label  & dispense according to the schedule below. Please take prednisone with breakfast daily until stopped. Take  6 Pills PO daily for three days then, 5 Pills PO daily for 7 days, then 4 Pills PO  daily  for 7 days, then  3Pills PO daily for 7 days,then 2 Pills PO daily for 7 days, 1 Pills PO daily for 7 days,  then STOP , further instruction regarding prednisone to be determined by your pulmonologist Dr Verlee Monte. Total of 123 tabs 123 tablet 0   prochlorperazine (COMPAZINE) 10 MG tablet Take 1 tablet (10 mg total) by mouth every 6 (six) hours as needed. 30 tablet 2   No facility-administered medications prior to visit.    ROS: Review of Systems  Constitutional:  Positive for fatigue. Negative for appetite change and unexpected weight change.  HENT:  Negative for congestion, nosebleeds, sneezing, sore throat and trouble swallowing.   Eyes:  Negative for itching and visual disturbance.  Respiratory:  Positive for shortness of breath. Negative for cough.   Cardiovascular:  Negative for chest pain, palpitations and leg swelling.  Gastrointestinal:  Negative for abdominal distention, blood in stool, diarrhea and nausea.  Genitourinary:  Negative for frequency and hematuria.  Musculoskeletal:  Negative for back pain, gait problem, joint swelling and neck pain.  Skin:  Negative for rash.  Neurological:  Negative for dizziness, tremors, speech difficulty and weakness.  Hematological:  Bruises/bleeds easily.  Psychiatric/Behavioral:  Negative for agitation, dysphoric mood and sleep disturbance. The patient is not nervous/anxious.    Objective:  BP 120/60 (BP Location: Left Arm, Patient Position: Sitting, Cuff Size: Large)   Pulse (!) 54   Temp 97.9 F (36.6 C) (Oral)   Ht 5\' 11"  (1.803 m)   Wt 195 lb (88.5 kg)   SpO2 98%   BMI 27.20 kg/m   BP Readings from Last 3 Encounters:  02/24/21 120/60  02/13/21 119/70  01/19/21 (!) 143/76    Wt Readings from Last 3 Encounters:  02/24/21 195 lb (88.5 kg)  02/09/21 195 lb 1.7 oz (88.5 kg)  01/19/21 195 lb 3.2 oz (88.5 kg)    Physical Exam Constitutional:      General: He is not in acute distress.    Appearance: He is well-developed.  He is not toxic-appearing.     Comments: NAD  Eyes:     Conjunctiva/sclera: Conjunctivae normal.     Pupils: Pupils are equal, round, and reactive to light.  Neck:     Thyroid: No thyromegaly.     Vascular: No JVD.  Cardiovascular:     Rate and Rhythm: Normal rate and regular rhythm.     Heart sounds: Normal heart sounds. No murmur heard.   No friction rub. No gallop.  Pulmonary:     Effort: Pulmonary effort  is normal. No respiratory distress.     Breath sounds: Normal breath sounds. No wheezing, rhonchi or rales.  Chest:     Chest wall: No tenderness.  Abdominal:     General: Bowel sounds are normal. There is no distension.     Palpations: Abdomen is soft. There is no mass.     Tenderness: There is no abdominal tenderness. There is no guarding or rebound.  Musculoskeletal:        General: No tenderness. Normal range of motion.     Cervical back: Normal range of motion.  Lymphadenopathy:     Cervical: No cervical adenopathy.  Skin:    General: Skin is warm and dry.     Findings: No rash.  Neurological:     Mental Status: He is alert and oriented to person, place, and time.     Cranial Nerves: No cranial nerve deficit.     Motor: No abnormal muscle tone.     Coordination: Coordination abnormal.     Gait: Gait abnormal.     Deep Tendon Reflexes: Reflexes are normal and symmetric.  Psychiatric:        Behavior: Behavior normal.        Thought Content: Thought content normal.        Judgment: Judgment normal.  Decreased BS at bases DOE - O2 is on Using a cane  I personally provided Breo inhaler use teaching. After teaching the patient should be able to use effectively. All questions were answered   Lab Results  Component Value Date   WBC 15.6 (H) 02/12/2021   HGB 12.0 (L) 02/12/2021   HCT 38.8 (L) 02/12/2021   PLT 287 02/12/2021   GLUCOSE 91 02/12/2021   CHOL 153 11/23/2018   TRIG 201.0 (H) 11/23/2018   HDL 38.20 (L) 11/23/2018   LDLDIRECT 99.0 11/23/2018    LDLCALC 80 11/07/2017   ALT 90 (H) 02/12/2021   AST 70 (H) 02/12/2021   NA 139 02/12/2021   K 4.1 02/12/2021   CL 103 02/12/2021   CREATININE 0.61 02/12/2021   BUN 22 02/12/2021   CO2 31 02/12/2021   TSH 2.085 01/19/2021   PSA 0.23 11/23/2018   INR 1.4 (H) 10/15/2019    CT Angio Chest PE W and/or Wo Contrast  Result Date: 02/09/2021 CLINICAL DATA:  Worsening shortness of breath, low O2 sats, lung cancer staging. Chemo radiation completed. Last immunotherapy 3 weeks ago. EXAM: CT ANGIOGRAPHY CHEST WITH CONTRAST TECHNIQUE: Multidetector CT imaging of the chest was performed using the standard protocol during bolus administration of intravenous contrast. Multiplanar CT image reconstructions and MIPs were obtained to evaluate the vascular anatomy. CONTRAST:  23mL OMNIPAQUE IOHEXOL 350 MG/ML SOLN COMPARISON:  10/24/2020. FINDINGS: Cardiovascular: Negative for pulmonary embolus. Atherosclerotic calcification of the aorta, aortic valve and coronary arteries. Ascending aorta measures up to 4.1 cm (coronal series 7, image 53). Heart is at the upper limits of normal in size. No pericardial effusion. Mediastinum/Nodes: Mediastinal and hilar lymph nodes are not enlarged by CT size criteria. No axillary adenopathy. Esophagus is grossly unremarkable. Lungs/Pleura: Centrilobular emphysema. Post treatment collapse/consolidation in the left upper lobe, unchanged. Increasing subpleural nodularity and ground-glass in the mid and lower lung zones. No pleural fluid. Airway is unremarkable. Upper Abdomen: Visualized portion of the liver is decreased in attenuation diffusely. Stones in the gallbladder. Visualized portions of the adrenal glands, kidneys, spleen, pancreas, stomach and bowel are grossly unremarkable. Periportal lymph nodes measure up to 1.8 cm (4/160) and are likely reactive.  Musculoskeletal: Degenerative changes in the spine. No worrisome lytic or sclerotic lesions. Old left rib fractures. Review of the MIP  images confirms the above findings. IMPRESSION: 1. Negative for pulmonary embolus. 2. 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 3. Post treatment collapse/consolidation in the left upper lobe, stable. No evidence of progressive disease. 4. Hepatic steatosis. 5. Cholelithiasis. 6. Ascending aortic aneurysm, stable. Recommend annual imaging followup by CTA or MRA. This recommendation follows 2010 ACCF/AHA/AATS/ACR/ASA/SCA/SCAI/SIR/STS/SVM Guidelines for the Diagnosis and Management of Patients with Thoracic Aortic Disease. Circulation. 2010; 121: C947-S962. Aortic aneurysm NOS (ICD10-I71.9). 7. Aortic atherosclerosis (ICD10-I70.0). Coronary artery calcification. 8.  Emphysema (ICD10-J43.9). Electronically Signed   By: Lorin Picket M.D.   On: 02/09/2021 12:50   DG Chest Portable 1 View  Result Date: 02/09/2021 CLINICAL DATA:  Shortness of breath EXAM: PORTABLE CHEST 1 VIEW COMPARISON:  Previous studies including the CT chest done on 10/24/2020 FINDINGS: Cardiac size is within normal limits. There is decreased volume in the left lung. Low position of diaphragms may suggest COPD. Small patchy infiltrate is seen in the left apex. Overall, no significant interval changes are noted in the infiltrate in the left upper lung fields in comparison with the CT done on 10/24/2020. Rest of the lung fields are clear. There is no significant pleural effusion or pneumothorax. IMPRESSION: Small patchy infiltrate is seen in left upper lung fields with no significant interval change. There are no signs of alveolar pulmonary edema or new focal infiltrates. Electronically Signed   By: Elmer Picker M.D.   On: 02/09/2021 11:22    Assessment & Plan:   Problem List Items Addressed This Visit     COPD exacerbation ([place]) - Primary    Recent RF, exacerbation of COPD On Prednisone now - taper (on 40 mg/d at  present) , on O2 2 l/min [place]. Cough is better. F/u w/Dr Verlee Monte      Relevant Medications   Fluticasone-Umeclidin-Vilant (TRELEGY ELLIPTA) 100-62.5-25 MCG/ACT AEPB   Other Relevant Orders   CBC with Differential/Platelet   Comprehensive metabolic panel   Magnesium   COPD mixed type (HCC)     Recent RF, exacerbation of COPD On Prednisone now - taper (on 40 mg/d at present) , on O2 2 l/min [place]. Cough is better. F/u w/Dr Verlee Monte         Relevant Medications   Fluticasone-Umeclidin-Vilant (TRELEGY ELLIPTA) 100-62.5-25 MCG/ACT AEPB   Elevated LFTs    Repeat LFTs      Relevant Orders   CBC with Differential/Platelet   Comprehensive metabolic panel   Magnesium   Essential hypertension    Cont on Norvasc, Atenolol F/u w/Dr Radford Pax      Relevant Orders   Magnesium   Hypothyroidism    Cont Levothyroxine      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)    Sam will see Dr Julien Nordmann today         Meds ordered this encounter  Medications   Fluticasone-Umeclidin-Vilant (TRELEGY ELLIPTA) 100-62.5-25 MCG/ACT AEPB    Sig: Inhale 1 puff into the lungs daily.    Dispense:  1 each    Refill:  11       Follow-up: Return in about 2 months (around 04/27/2021) for a follow-up visit.  Walker Kehr, MD

## 2021-02-24 NOTE — Assessment & Plan Note (Signed)
Cont on Norvasc, Atenolol F/u w/Dr Radford Pax

## 2021-02-24 NOTE — Assessment & Plan Note (Signed)
Repeat LFTs

## 2021-02-24 NOTE — Assessment & Plan Note (Signed)
Joe [name] will see Dr Julien Nordmann today

## 2021-02-25 DIAGNOSIS — G4733 Obstructive sleep apnea (adult) (pediatric): Secondary | ICD-10-CM | POA: Diagnosis not present

## 2021-02-25 DIAGNOSIS — J8489 Other specified interstitial pulmonary diseases: Secondary | ICD-10-CM | POA: Diagnosis not present

## 2021-02-25 DIAGNOSIS — I480 Paroxysmal atrial fibrillation: Secondary | ICD-10-CM | POA: Diagnosis not present

## 2021-02-25 DIAGNOSIS — J9601 Acute respiratory failure with hypoxia: Secondary | ICD-10-CM | POA: Diagnosis not present

## 2021-02-25 DIAGNOSIS — C3491 Malignant neoplasm of unspecified part of right bronchus or lung: Secondary | ICD-10-CM | POA: Diagnosis not present

## 2021-02-25 DIAGNOSIS — J449 Chronic obstructive pulmonary disease, unspecified: Secondary | ICD-10-CM | POA: Diagnosis not present

## 2021-02-27 ENCOUNTER — Telehealth: Payer: Self-pay | Admitting: Internal Medicine

## 2021-02-27 DIAGNOSIS — G4733 Obstructive sleep apnea (adult) (pediatric): Secondary | ICD-10-CM | POA: Diagnosis not present

## 2021-02-27 DIAGNOSIS — C3491 Malignant neoplasm of unspecified part of right bronchus or lung: Secondary | ICD-10-CM | POA: Diagnosis not present

## 2021-02-27 DIAGNOSIS — J449 Chronic obstructive pulmonary disease, unspecified: Secondary | ICD-10-CM | POA: Diagnosis not present

## 2021-02-27 DIAGNOSIS — I480 Paroxysmal atrial fibrillation: Secondary | ICD-10-CM | POA: Diagnosis not present

## 2021-02-27 DIAGNOSIS — J9601 Acute respiratory failure with hypoxia: Secondary | ICD-10-CM | POA: Diagnosis not present

## 2021-02-27 DIAGNOSIS — J8489 Other specified interstitial pulmonary diseases: Secondary | ICD-10-CM | POA: Diagnosis not present

## 2021-02-27 NOTE — Telephone Encounter (Signed)
Sch per 12/7 los, pt aware

## 2021-03-02 DIAGNOSIS — C9110 Chronic lymphocytic leukemia of B-cell type not having achieved remission: Secondary | ICD-10-CM | POA: Diagnosis not present

## 2021-03-02 DIAGNOSIS — G4733 Obstructive sleep apnea (adult) (pediatric): Secondary | ICD-10-CM | POA: Diagnosis not present

## 2021-03-02 DIAGNOSIS — I719 Aortic aneurysm of unspecified site, without rupture: Secondary | ICD-10-CM

## 2021-03-02 DIAGNOSIS — Z9981 Dependence on supplemental oxygen: Secondary | ICD-10-CM

## 2021-03-02 DIAGNOSIS — K760 Fatty (change of) liver, not elsewhere classified: Secondary | ICD-10-CM | POA: Diagnosis not present

## 2021-03-02 DIAGNOSIS — F32A Depression, unspecified: Secondary | ICD-10-CM

## 2021-03-02 DIAGNOSIS — E785 Hyperlipidemia, unspecified: Secondary | ICD-10-CM | POA: Diagnosis not present

## 2021-03-02 DIAGNOSIS — J8489 Other specified interstitial pulmonary diseases: Secondary | ICD-10-CM | POA: Diagnosis not present

## 2021-03-02 DIAGNOSIS — I2510 Atherosclerotic heart disease of native coronary artery without angina pectoris: Secondary | ICD-10-CM

## 2021-03-02 DIAGNOSIS — E039 Hypothyroidism, unspecified: Secondary | ICD-10-CM | POA: Diagnosis not present

## 2021-03-02 DIAGNOSIS — J449 Chronic obstructive pulmonary disease, unspecified: Secondary | ICD-10-CM | POA: Diagnosis not present

## 2021-03-02 DIAGNOSIS — Z7901 Long term (current) use of anticoagulants: Secondary | ICD-10-CM

## 2021-03-02 DIAGNOSIS — I10 Essential (primary) hypertension: Secondary | ICD-10-CM | POA: Diagnosis not present

## 2021-03-02 DIAGNOSIS — C3491 Malignant neoplasm of unspecified part of right bronchus or lung: Secondary | ICD-10-CM | POA: Diagnosis not present

## 2021-03-02 DIAGNOSIS — K219 Gastro-esophageal reflux disease without esophagitis: Secondary | ICD-10-CM

## 2021-03-02 DIAGNOSIS — Z9181 History of falling: Secondary | ICD-10-CM

## 2021-03-02 DIAGNOSIS — F411 Generalized anxiety disorder: Secondary | ICD-10-CM | POA: Diagnosis not present

## 2021-03-02 DIAGNOSIS — J9601 Acute respiratory failure with hypoxia: Secondary | ICD-10-CM | POA: Diagnosis not present

## 2021-03-02 DIAGNOSIS — I480 Paroxysmal atrial fibrillation: Secondary | ICD-10-CM | POA: Diagnosis not present

## 2021-03-03 ENCOUNTER — Ambulatory Visit (HOSPITAL_COMMUNITY): Payer: Medicare Other | Attending: Internal Medicine

## 2021-03-03 ENCOUNTER — Other Ambulatory Visit: Payer: Self-pay

## 2021-03-03 DIAGNOSIS — I351 Nonrheumatic aortic (valve) insufficiency: Secondary | ICD-10-CM | POA: Insufficient documentation

## 2021-03-03 LAB — ECHOCARDIOGRAM COMPLETE
AR max vel: 2.39 cm2
AV Area VTI: 2.57 cm2
AV Area mean vel: 2.36 cm2
AV Mean grad: 10 mmHg
AV Peak grad: 20.4 mmHg
Ao pk vel: 2.26 m/s
Area-P 1/2: 2.05 cm2
S' Lateral: 3 cm

## 2021-03-03 NOTE — Progress Notes (Signed)
Synopsis: Referred for organizing pneumonia by [name], [name], MD  Subjective:   PATIENT ID: Joe [name] GENDER: male DOB: 1946/01/27, MRN: [id number]  Chief Complaint  Patient presents with   Hospitalization Follow-up    Pt states he have some questions    63yM with stage IIIb NSCLC, squamous cell carcinoma who originally presented with LUL mass and mediastinal LAD, contralateral R hilar hypermetabolism wh ois s/p chemoXRT now on consolidation IT with imfinzi s/p 13 cycles, CLL dx 2014/06/05, COPD, pAF, hypothyroid, GERD, OSA on CPAP.  He says his breathing is ok. Discharged on O2 2L with exertion. O2 with exertion does improve his DOE. He doesn't have near as much cough. Not noticing any issue with worsening dyspnea as dose of prednisone is decreased (currently 30 mg daily since 2 days before visit). Getting home PT.   Was started on trelegy by PCP but he had reservations about taking it - concern over effect on HR, urinary retention.  Otherwise pertinent review of systems is negative.  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   chronic stage 1- no symtoms   CLL (chronic lymphocytic leukemia) ([place]) 08/08/2014   06-05-2014 Dr Burr Medico Stage 0   COPD mixed type ([place]) 07/26/2007   Smoker - stopped 6/14    De Quervain's tenosynovitis, right 12-19-2013   06/04/2013    Depression    at times   Dysrhythmia    PAF   Dysuria 19-Dec-2013   9/15 - poss stricture Urol ref was offered    Gallstones 11/16/2017   Asymptomatic Pt refused surg ref   Generalized anxiety disorder 09/07/2012   Chronic   Potential benefits of a long term steroid  use as well as potential risks  and complications were explained to the patient and were aknowledged.      GERD 12/02/2006   Chronic      Grief 20-May-2016   [name] died in Jun 05, 2014   Gynecomastia  2013/12/19   Benign B 06/04/13    Hyperlipidemia    Hypertension    Hypothyroidism 12/25/2014   06-05-2014 On Levothyroxine    Intertrigo 02/02/2012   11/13    Neoplasm of uncertain behavior of skin 03/12/2013   12/14 R ear, chest    OSA on CPAP    mild with AHI 9/hr and oxygen desats as low as 75%   Paresis ([place])    right- s/p cerv decompression   PERIORBITAL CELLULITIS 02/22/2009   Qualifier: Diagnosis of  By: Diona Browner MD, Amy     Retinal detachment    L>>R     Family History  Problem Relation Age of Onset   COPD Mother    Diabetes Father    Coronary artery disease Other    Breast cancer Paternal Aunt 40   Colon cancer Neg Hx      Past Surgical History:  Procedure Laterality Date   BICEPS TENDON REPAIR     BRONCHIAL BIOPSY  10/23/2019   Procedure: BRONCHIAL BIOPSIES;  Surgeon: [name], MD;  Location: [facility] ENDOSCOPY;  Service: Pulmonary;;   BRONCHIAL BIOPSY  11/20/2019   Procedure: BRONCHIAL BIOPSIES;  Surgeon: [name], MD;  Location: [facility] ENDOSCOPY;  Service: Pulmonary;;   BRONCHIAL BRUSHINGS  10/23/2019   Procedure: BRONCHIAL BRUSHINGS;  Surgeon: [name], MD;  Location: [facility] ENDOSCOPY;  Service: Pulmonary;;   BRONCHIAL BRUSHINGS  11/20/2019   Procedure: BRONCHIAL BRUSHINGS;  Surgeon: [name], MD;  Location: [facility] ENDOSCOPY;  Service: Pulmonary;;   BRONCHIAL NEEDLE ASPIRATION BIOPSY  10/23/2019   Procedure: BRONCHIAL NEEDLE ASPIRATION BIOPSIES;  Surgeon: [name], MD;  Location: [facility] ENDOSCOPY;  Service: Pulmonary;;   BRONCHIAL NEEDLE ASPIRATION BIOPSY  11/20/2019   Procedure: BRONCHIAL NEEDLE ASPIRATION BIOPSIES;  Surgeon: [name], MD;  Location: [facility] ENDOSCOPY;  Service: Pulmonary;;   BRONCHIAL WASHINGS  11/20/2019   Procedure: BRONCHIAL WASHINGS;  Surgeon: [name], MD;  Location: [place];  Service: Pulmonary;;   CATARACT EXTRACTION Left    COLONOSCOPY  04-14-99   Dr Flossie Dibble polyp-TA in epic   POLYPECTOMY  04-14-99   POSTERIOR LAMINECTOMY /  DECOMPRESSION CERVICAL SPINE     Dr Saintclair Halsted   RETINAL DETACHMENT SURGERY     left eye, 2007 x2, 2008 x 3   ROTATOR CUFF REPAIR  2004   right   TONSILLECTOMY  1941,[id number]   VIDEO BRONCHOSCOPY WITH ENDOBRONCHIAL NAVIGATION N/A 10/23/2019   Procedure: VIDEO BRONCHOSCOPY WITH ENDOBRONCHIAL NAVIGATION;  Surgeon: [name], MD;  Location: [facility] ENDOSCOPY;  Service: Pulmonary;  Laterality: N/A;   VIDEO BRONCHOSCOPY WITH ENDOBRONCHIAL NAVIGATION N/A 11/20/2019   Procedure: VIDEO BRONCHOSCOPY WITH ENDOBRONCHIAL NAVIGATION;  Surgeon: [name], MD;  Location: [place] ENDOSCOPY;  Service: Pulmonary;  Laterality: N/A;   VIDEO BRONCHOSCOPY WITH ENDOBRONCHIAL ULTRASOUND N/A 10/23/2019   Procedure: VIDEO BRONCHOSCOPY WITH ENDOBRONCHIAL ULTRASOUND;  Surgeon: [name], MD;  Location: [place] ENDOSCOPY;  Service: Pulmonary;  Laterality: N/A;    Social History   Socioeconomic History   Marital status: Widowed    Spouse name: Not on file   Number of children: 1   Years of education: Not on file   Highest education level: Not on file  Occupational History   Occupation: retired  Tobacco Use   Smoking status: Former    Packs/day: 0.80    Years: 50.00    Pack years: 40.00    Types: Cigarettes    Quit date: 08/27/2012    Years since quitting: 8.5   Smokeless tobacco: Never  Vaping Use   Vaping Use: Never used  Substance and Sexual Activity   Alcohol use: Not Currently   Drug use: No   Sexual activity: Not Currently  Other Topics Concern   Not on file  Social History Narrative   Not on file   Social Determinants of Health   Financial Resource Strain: Not on file  Food Insecurity: Not on file  Transportation Needs: Not on file  Physical Activity: Not on file  Stress: Not on file  Social Connections: Not on file  Intimate Partner Violence: Not on file     No Known Allergies   Outpatient Medications Prior to Visit  Medication Sig Dispense Refill   amLODipine (NORVASC) 5 MG tablet Take 1  tablet (5 mg total) by mouth every evening. Please hold this medication for now, please check your blood pressure daily in the morning, bring in record for your pcp to review. Your pcp and you can decide on resumption or discontinuation of this medication     atenolol (TENORMIN) 25 MG tablet TAKE 1 TABLET BY MOUTH DAILY AS NEEDED FOR PALPITATIONS (Patient taking differently: Take 25 mg by mouth daily as needed (palpitations).) 90 tablet 3   Cholecalciferol 25 MCG (1000 UT) tablet Take 1,000 Units by mouth daily.  diazepam (VALIUM) 5 MG tablet TAKE 1 TABLET(5 MG) BY MOUTH EVERY 12 HOURS (Patient taking differently: Take 5 mg by mouth every 12 (twelve) hours as needed for anxiety.) 180 tablet 1   ELIQUIS 5 MG TABS tablet TAKE 1 TABLET(5 MG) BY MOUTH TWICE DAILY (Patient taking differently: Take 5 mg by mouth 2 (two) times daily.) 180 tablet 1   famotidine (PEPCID) 20 MG tablet Take 1 tablet (20 mg total) by mouth at bedtime. 30 tablet 1   Fluticasone-Umeclidin-Vilant (TRELEGY ELLIPTA) 100-62.5-25 MCG/ACT AEPB Inhale 1 puff into the lungs daily. 1 each 11   guaiFENesin (MUCINEX) 600 MG 12 hr tablet Take 1 tablet (600 mg total) by mouth 2 (two) times daily.     levothyroxine (SYNTHROID) 112 MCG tablet TAKE 1 TABLET(112 MCG) BY MOUTH DAILY 30 tablet 2   lovastatin (MEVACOR) 20 MG tablet TAKE 1 TABLET BY MOUTH EVERY NIGHT AT BEDTIME (Patient taking differently: Take 20 mg by mouth at bedtime.) 90 tablet 2   Multiple Vitamin (MULTIVITAMIN) tablet Take 1 tablet by mouth daily. Centrum Silver.     Polyethyl Glycol-Propyl Glycol (SYSTANE) 0.4-0.3 % SOLN Place 1 drop into both eyes daily as needed (Dry eyes). Ultra     predniSONE (DELTASONE) 10 MG tablet Label  & dispense according to the schedule below. Please take prednisone with breakfast daily until stopped. Take  6 Pills PO daily for three days then, 5 Pills PO daily for 7 days, then 4 Pills PO daily  for 7 days, then  3Pills PO daily for 7 days,then 2  Pills PO daily for 7 days, 1 Pills PO daily for 7 days,  then STOP , further instruction regarding prednisone to be determined by your pulmonologist Dr Verlee Monte. Total of 123 tabs 123 tablet 0   prochlorperazine (COMPAZINE) 10 MG tablet Take 1 tablet (10 mg total) by mouth every 6 (six) hours as needed. 30 tablet 2   clotrimazole-betamethasone (LOTRISONE) cream Apply topically 2 (two) times daily. (Patient not taking: Reported on 03/04/2021) 45 g 2   No facility-administered medications prior to visit.       Objective:   Physical Exam:  General appearance: 75 y.o., male, NAD, conversant  Eyes: anicteric sclerae; PERRL, tracking appropriately HENT: NCAT; MMM Neck: Trachea midline; no lymphadenopathy, no JVD Lungs: Diminished bilaterally, no crackles, no wheeze, with normal respiratory effort CV: RRR, no murmur  Abdomen: Soft, non-tender; non-distended, BS present  Extremities: No peripheral edema, warm Skin: Normal turgor and texture; no rash Psych: Appropriate affect Neuro: Alert and oriented to person and place, no focal deficit     Vitals:   03/04/21 0953  BP: (!) 148/76  Pulse: 64  Temp: 98 F (36.7 C)  TempSrc: Oral  SpO2: 96%  Weight: 180 lb 6.4 oz (81.8 kg)  Height: 5\' 11"  (1.803 m)   96% on 2 LPM  BMI Readings from Last 3 Encounters:  03/04/21 25.16 kg/m  02/24/21 25.41 kg/m  02/24/21 27.20 kg/m   Wt Readings from Last 3 Encounters:  03/04/21 180 lb 6.4 oz (81.8 kg)  02/24/21 182 lb 3.2 oz (82.6 kg)  02/24/21 195 lb (88.5 kg)     CBC    Component Value Date/Time   WBC 19.3 (H) 02/24/2021 1337   WBC 19.0 Repeated and verified X2. (HH) 02/24/2021 1029   RBC 5.17 02/24/2021 1337   HGB 14.7 02/24/2021 1337   HGB 14.8 02/24/2021 1029   HGB 17.2 (H) 03/10/2017 0911   HCT 46.5 02/24/2021 1337  HCT 52.5 (H) 03/10/2017 0911   PLT 334 02/24/2021 1337   PLT 371.0 02/24/2021 1029   PLT 241 03/10/2017 0911   MCV 89.9 02/24/2021 1337   MCV 96.7 03/10/2017  0911   MCH 28.4 02/24/2021 1337   MCHC 31.6 02/24/2021 1337   RDW 14.4 02/24/2021 1337   RDW 13.6 03/10/2017 0911   LYMPHSABS 3.9 02/24/2021 1337   LYMPHSABS 9.8 (H) 03/10/2017 0911   MONOABS 1.1 (H) 02/24/2021 1337   MONOABS 1.0 (H) 03/10/2017 0911   EOSABS 0.0 02/24/2021 1337   EOSABS 0.3 03/10/2017 0911   BASOSABS 0.0 02/24/2021 1337   BASOSABS 0.1 03/10/2017 0911     Chest Imaging: CTA Chest 02/09/21 reviewed by me with peripheral/subpleural consolidation  Pulmonary Functions Testing Results: PFT Results Latest Ref Rng & Units 01/02/2020  FVC-Pre L 2.61  FVC-Predicted Pre % 58  FVC-Post L 3.13  FVC-Predicted Post % 69  Pre FEV1/FVC % % 54  Post FEV1/FCV % % 53  FEV1-Pre L 1.41  FEV1-Predicted Pre % 43  FEV1-Post L 1.66  DLCO uncorrected ml/min/mmHg 16.70  DLCO UNC% % 63  DLCO corrected ml/min/mmHg 16.56  DLCO COR %Predicted % 63  DLVA Predicted % 84  TLC L 6.87  TLC % Predicted % 94  RV % Predicted % 153   Echocardiogram:   TTE 1. Left ventricular ejection fraction, by estimation, is 60 to 65%. Left  ventricular ejection fraction by 3D volume is 64 %. The left ventricle has  normal function. The left ventricle has no regional wall motion  abnormalities. Left ventricular diastolic   parameters are consistent with Grade I diastolic dysfunction (impaired  relaxation). The average left ventricular global longitudinal strain is  -23.0 %. The global longitudinal strain is normal.   2. Right ventricular systolic function is normal. The right ventricular  size is normal.   3. The mitral valve is grossly normal. Trivial mitral valve  regurgitation.   4. The aortic valve is tricuspid. Aortic valve regurgitation is mild to  moderate. Aortic valve sclerosis is present, with no evidence of aortic  valve stenosis. Aortic valve mean gradient measures 10.0 mmHg.   5. Aortic dilatation noted. There is borderline dilatation of the aortic  root, measuring 39 mm. There is mild  dilatation of the ascending aorta,  measuring 42 mm.   6. The inferior vena cava is normal in size with greater than 50%       Assessment & Plan:   # Organizing pneumonia:  Likely related to imfinzi, would be grade 3 pneumonitis.  # COPD gold functional group B  # Chronic hypoxic respiratory failure: 2L with exertion. DME supplier [employer].  Plan: - continue steroid taper, decreasing prednisone by 10 mg each week until off. He is to call us if there is deterioration - will follow results of upcoming CT Chest - encouraged to try trelegy and discussed benefits re: decreased risk of AECOPD and mortality relative to non-triple therapy in advanced COPD, he declines, he prefers to instead use albuterol neb twice daily - PFT next visit, discuss pulmonary rehab referral after he's completed home PT       Maryjane Hurter, MD [facility] 03/04/2021 6:55 PM

## 2021-03-04 ENCOUNTER — Ambulatory Visit (INDEPENDENT_AMBULATORY_CARE_PROVIDER_SITE_OTHER): Payer: Medicare Other | Admitting: Student

## 2021-03-04 ENCOUNTER — Encounter: Payer: Self-pay | Admitting: Student

## 2021-03-04 VITALS — BP 148/76 | HR 64 | Temp 98.0°F | Ht 71.0 in | Wt 180.4 lb

## 2021-03-04 DIAGNOSIS — J449 Chronic obstructive pulmonary disease, unspecified: Secondary | ICD-10-CM

## 2021-03-04 DIAGNOSIS — J9611 Chronic respiratory failure with hypoxia: Secondary | ICD-10-CM

## 2021-03-04 DIAGNOSIS — J8489 Other specified interstitial pulmonary diseases: Secondary | ICD-10-CM | POA: Diagnosis not present

## 2021-03-04 MED ORDER — ALBUTEROL SULFATE (2.5 MG/3ML) 0.083% IN NEBU: 2.5000 mg | INHALATION_SOLUTION | Freq: Four times a day (QID) | RESPIRATORY_TRACT | 12 refills | Status: DC | PRN

## 2021-03-04 NOTE — Patient Instructions (Addendum)
-   PFTs (breathing tests) in 2 months on same day as our follow up appointment - Keep decreasing steroid dose each week consistent with our original plan/prescription, let me know if you feel any worse as dose is decreased - I'll keep an eye out for results of CT Chest next month - albuterol nebulizer as needed

## 2021-03-05 DIAGNOSIS — C3491 Malignant neoplasm of unspecified part of right bronchus or lung: Secondary | ICD-10-CM | POA: Diagnosis not present

## 2021-03-05 DIAGNOSIS — I480 Paroxysmal atrial fibrillation: Secondary | ICD-10-CM | POA: Diagnosis not present

## 2021-03-05 DIAGNOSIS — J8489 Other specified interstitial pulmonary diseases: Secondary | ICD-10-CM | POA: Diagnosis not present

## 2021-03-05 DIAGNOSIS — J9601 Acute respiratory failure with hypoxia: Secondary | ICD-10-CM | POA: Diagnosis not present

## 2021-03-05 DIAGNOSIS — J449 Chronic obstructive pulmonary disease, unspecified: Secondary | ICD-10-CM | POA: Diagnosis not present

## 2021-03-05 DIAGNOSIS — G4733 Obstructive sleep apnea (adult) (pediatric): Secondary | ICD-10-CM | POA: Diagnosis not present

## 2021-03-12 NOTE — Telephone Encounter (Signed)
Pt seen on 03/04/21 by Dr. Verlee Monte. Will close encounter.

## 2021-03-18 DIAGNOSIS — C3491 Malignant neoplasm of unspecified part of right bronchus or lung: Secondary | ICD-10-CM | POA: Diagnosis not present

## 2021-03-18 DIAGNOSIS — J9601 Acute respiratory failure with hypoxia: Secondary | ICD-10-CM | POA: Diagnosis not present

## 2021-03-18 DIAGNOSIS — G4733 Obstructive sleep apnea (adult) (pediatric): Secondary | ICD-10-CM | POA: Diagnosis not present

## 2021-03-18 DIAGNOSIS — J449 Chronic obstructive pulmonary disease, unspecified: Secondary | ICD-10-CM | POA: Diagnosis not present

## 2021-03-18 DIAGNOSIS — J8489 Other specified interstitial pulmonary diseases: Secondary | ICD-10-CM | POA: Diagnosis not present

## 2021-03-18 DIAGNOSIS — I480 Paroxysmal atrial fibrillation: Secondary | ICD-10-CM | POA: Diagnosis not present

## 2021-03-20 DIAGNOSIS — C9110 Chronic lymphocytic leukemia of B-cell type not having achieved remission: Secondary | ICD-10-CM | POA: Diagnosis not present

## 2021-03-20 DIAGNOSIS — F411 Generalized anxiety disorder: Secondary | ICD-10-CM | POA: Diagnosis not present

## 2021-03-20 DIAGNOSIS — J449 Chronic obstructive pulmonary disease, unspecified: Secondary | ICD-10-CM | POA: Diagnosis not present

## 2021-03-20 DIAGNOSIS — K219 Gastro-esophageal reflux disease without esophagitis: Secondary | ICD-10-CM | POA: Diagnosis not present

## 2021-03-20 DIAGNOSIS — I2510 Atherosclerotic heart disease of native coronary artery without angina pectoris: Secondary | ICD-10-CM | POA: Diagnosis not present

## 2021-03-20 DIAGNOSIS — J9601 Acute respiratory failure with hypoxia: Secondary | ICD-10-CM | POA: Diagnosis not present

## 2021-03-20 DIAGNOSIS — J8489 Other specified interstitial pulmonary diseases: Secondary | ICD-10-CM | POA: Diagnosis not present

## 2021-03-20 DIAGNOSIS — Z7901 Long term (current) use of anticoagulants: Secondary | ICD-10-CM | POA: Diagnosis not present

## 2021-03-20 DIAGNOSIS — I10 Essential (primary) hypertension: Secondary | ICD-10-CM | POA: Diagnosis not present

## 2021-03-20 DIAGNOSIS — E785 Hyperlipidemia, unspecified: Secondary | ICD-10-CM | POA: Diagnosis not present

## 2021-03-20 DIAGNOSIS — K760 Fatty (change of) liver, not elsewhere classified: Secondary | ICD-10-CM | POA: Diagnosis not present

## 2021-03-20 DIAGNOSIS — E039 Hypothyroidism, unspecified: Secondary | ICD-10-CM | POA: Diagnosis not present

## 2021-03-20 DIAGNOSIS — G4733 Obstructive sleep apnea (adult) (pediatric): Secondary | ICD-10-CM | POA: Diagnosis not present

## 2021-03-20 DIAGNOSIS — C3491 Malignant neoplasm of unspecified part of right bronchus or lung: Secondary | ICD-10-CM | POA: Diagnosis not present

## 2021-03-20 DIAGNOSIS — I480 Paroxysmal atrial fibrillation: Secondary | ICD-10-CM | POA: Diagnosis not present

## 2021-03-20 DIAGNOSIS — I719 Aortic aneurysm of unspecified site, without rupture: Secondary | ICD-10-CM | POA: Diagnosis not present

## 2021-03-20 DIAGNOSIS — Z9981 Dependence on supplemental oxygen: Secondary | ICD-10-CM | POA: Diagnosis not present

## 2021-03-20 DIAGNOSIS — F32A Depression, unspecified: Secondary | ICD-10-CM | POA: Diagnosis not present

## 2021-03-20 DIAGNOSIS — Z9181 History of falling: Secondary | ICD-10-CM | POA: Diagnosis not present

## 2021-03-24 DIAGNOSIS — I480 Paroxysmal atrial fibrillation: Secondary | ICD-10-CM | POA: Diagnosis not present

## 2021-03-24 DIAGNOSIS — J449 Chronic obstructive pulmonary disease, unspecified: Secondary | ICD-10-CM | POA: Diagnosis not present

## 2021-03-24 DIAGNOSIS — J8489 Other specified interstitial pulmonary diseases: Secondary | ICD-10-CM | POA: Diagnosis not present

## 2021-03-24 DIAGNOSIS — G4733 Obstructive sleep apnea (adult) (pediatric): Secondary | ICD-10-CM | POA: Diagnosis not present

## 2021-03-24 DIAGNOSIS — C3491 Malignant neoplasm of unspecified part of right bronchus or lung: Secondary | ICD-10-CM | POA: Diagnosis not present

## 2021-03-24 DIAGNOSIS — J9601 Acute respiratory failure with hypoxia: Secondary | ICD-10-CM | POA: Diagnosis not present

## 2021-03-26 DIAGNOSIS — J449 Chronic obstructive pulmonary disease, unspecified: Secondary | ICD-10-CM | POA: Diagnosis not present

## 2021-03-26 DIAGNOSIS — C3491 Malignant neoplasm of unspecified part of right bronchus or lung: Secondary | ICD-10-CM | POA: Diagnosis not present

## 2021-03-26 DIAGNOSIS — J9601 Acute respiratory failure with hypoxia: Secondary | ICD-10-CM | POA: Diagnosis not present

## 2021-03-26 DIAGNOSIS — I480 Paroxysmal atrial fibrillation: Secondary | ICD-10-CM | POA: Diagnosis not present

## 2021-03-26 DIAGNOSIS — J8489 Other specified interstitial pulmonary diseases: Secondary | ICD-10-CM | POA: Diagnosis not present

## 2021-03-26 DIAGNOSIS — G4733 Obstructive sleep apnea (adult) (pediatric): Secondary | ICD-10-CM | POA: Diagnosis not present

## 2021-03-31 ENCOUNTER — Ambulatory Visit (HOSPITAL_COMMUNITY)
Admission: RE | Admit: 2021-03-31 | Discharge: 2021-03-31 | Disposition: A | Discharge status: Home / Self Care | Payer: Medicare Other | Source: Ambulatory Visit | Attending: Student | Admitting: Student

## 2021-03-31 ENCOUNTER — Other Ambulatory Visit: Payer: Self-pay

## 2021-03-31 ENCOUNTER — Other Ambulatory Visit (HOSPITAL_COMMUNITY): Payer: Medicare Other

## 2021-03-31 DIAGNOSIS — J189 Pneumonia, unspecified organism: Secondary | ICD-10-CM | POA: Diagnosis not present

## 2021-03-31 DIAGNOSIS — R918 Other nonspecific abnormal finding of lung field: Secondary | ICD-10-CM | POA: Diagnosis not present

## 2021-03-31 DIAGNOSIS — J8489 Other specified interstitial pulmonary diseases: Secondary | ICD-10-CM | POA: Insufficient documentation

## 2021-03-31 DIAGNOSIS — I700 Atherosclerosis of aorta: Secondary | ICD-10-CM | POA: Diagnosis not present

## 2021-03-31 DIAGNOSIS — R911 Solitary pulmonary nodule: Secondary | ICD-10-CM | POA: Diagnosis not present

## 2021-03-31 DIAGNOSIS — J439 Emphysema, unspecified: Secondary | ICD-10-CM | POA: Diagnosis not present

## 2021-04-01 DIAGNOSIS — I480 Paroxysmal atrial fibrillation: Secondary | ICD-10-CM | POA: Diagnosis not present

## 2021-04-01 DIAGNOSIS — J9601 Acute respiratory failure with hypoxia: Secondary | ICD-10-CM | POA: Diagnosis not present

## 2021-04-01 DIAGNOSIS — C3491 Malignant neoplasm of unspecified part of right bronchus or lung: Secondary | ICD-10-CM | POA: Diagnosis not present

## 2021-04-01 DIAGNOSIS — J449 Chronic obstructive pulmonary disease, unspecified: Secondary | ICD-10-CM | POA: Diagnosis not present

## 2021-04-01 DIAGNOSIS — J8489 Other specified interstitial pulmonary diseases: Secondary | ICD-10-CM | POA: Diagnosis not present

## 2021-04-01 DIAGNOSIS — G4733 Obstructive sleep apnea (adult) (pediatric): Secondary | ICD-10-CM | POA: Diagnosis not present

## 2021-04-08 DIAGNOSIS — J9601 Acute respiratory failure with hypoxia: Secondary | ICD-10-CM | POA: Diagnosis not present

## 2021-04-08 DIAGNOSIS — C3491 Malignant neoplasm of unspecified part of right bronchus or lung: Secondary | ICD-10-CM | POA: Diagnosis not present

## 2021-04-08 DIAGNOSIS — G4733 Obstructive sleep apnea (adult) (pediatric): Secondary | ICD-10-CM | POA: Diagnosis not present

## 2021-04-08 DIAGNOSIS — J8489 Other specified interstitial pulmonary diseases: Secondary | ICD-10-CM | POA: Diagnosis not present

## 2021-04-08 DIAGNOSIS — I480 Paroxysmal atrial fibrillation: Secondary | ICD-10-CM | POA: Diagnosis not present

## 2021-04-08 DIAGNOSIS — J449 Chronic obstructive pulmonary disease, unspecified: Secondary | ICD-10-CM | POA: Diagnosis not present

## 2021-04-13 IMAGING — CT CT CHEST W/ CM
2 of 3 series · 14 of 36 positions shown, 17 images · IV contrast (omnipaque)
Comparison: Chest CT 09/07/2019.  PET-CT 11/09/2019.
COMPARISON: Chest CT 09/07/2019.  PET-CT 11/09/2019.

Addendum:
CLINICAL DATA: Follow up lung cancer post chemotherapy and
radiation therapy completed 10 months ago.

EXAM:
CT CHEST WITH CONTRAST
TECHNIQUE: Multidetector CT imaging of the chest was performed during
intravenous contrast administration.
CONTRAST:  75mL OMNIPAQUE IOHEXOL 300 MG/ML  SOLN

[Series 2: axial st · axial · 0.80mm/px · z∈[-224,+64]mm · 11 of 170 slices shown, 14 images]
[im 13/170  mediastinal]
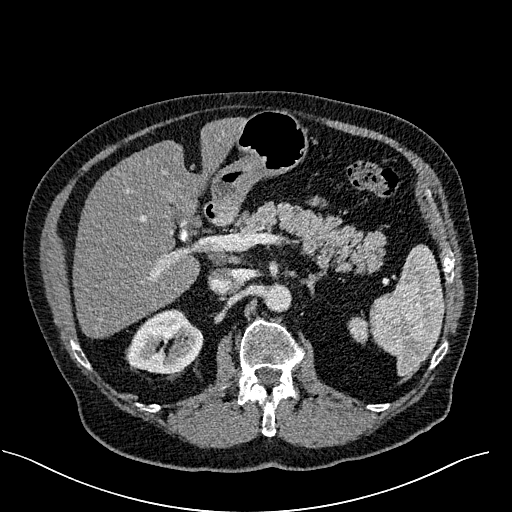
[im 13/170  lung]
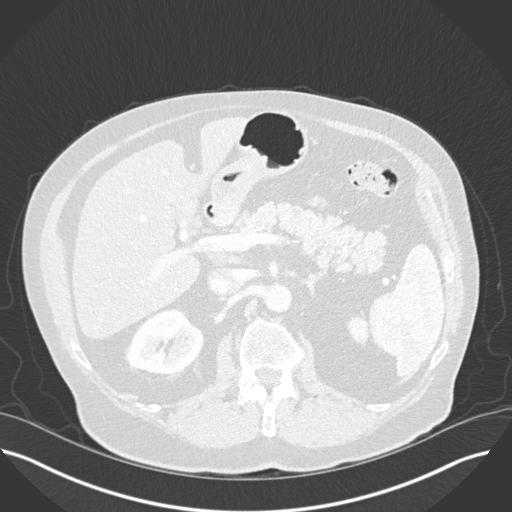
[im 26/170  lung]
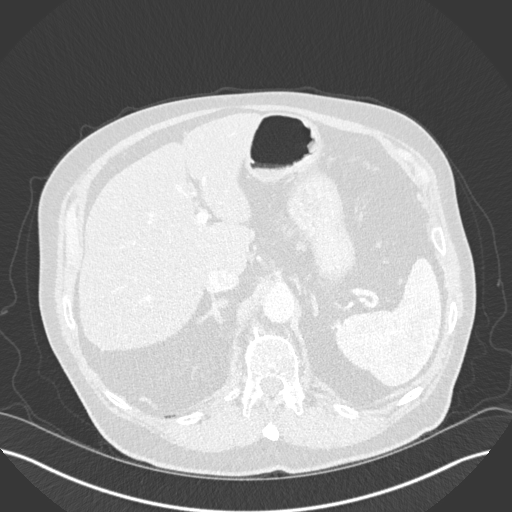
[im 38/170  lung]
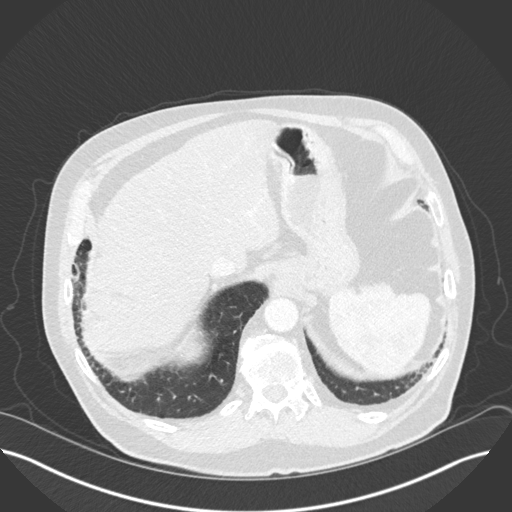
[im 57/170  lung]
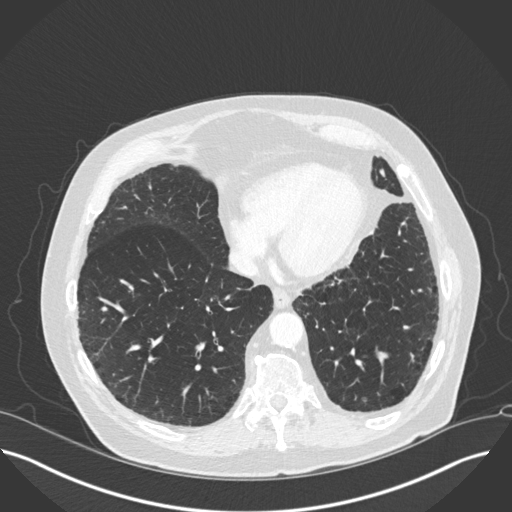
[im 69/170  mediastinal]
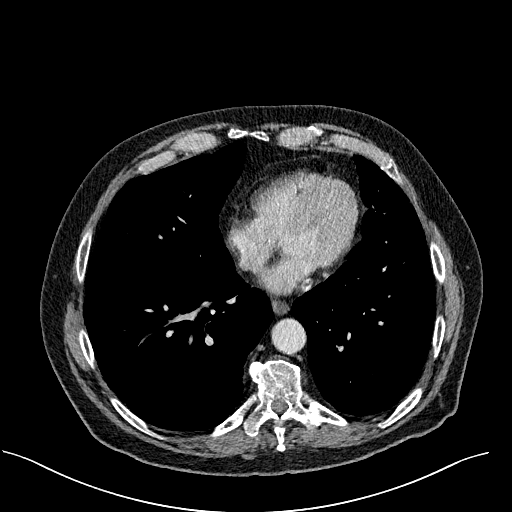
[im 69/170  lung]
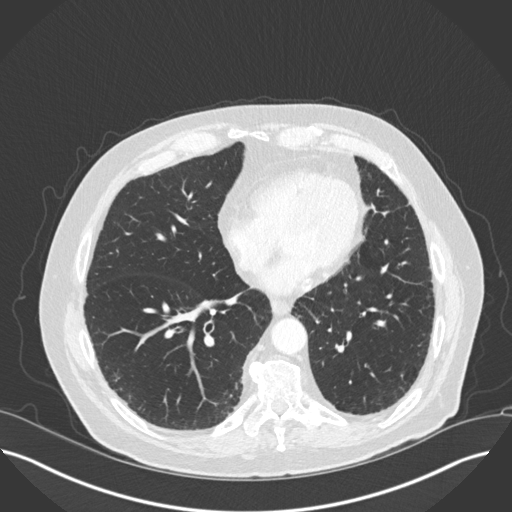
[im 88/170  lung]
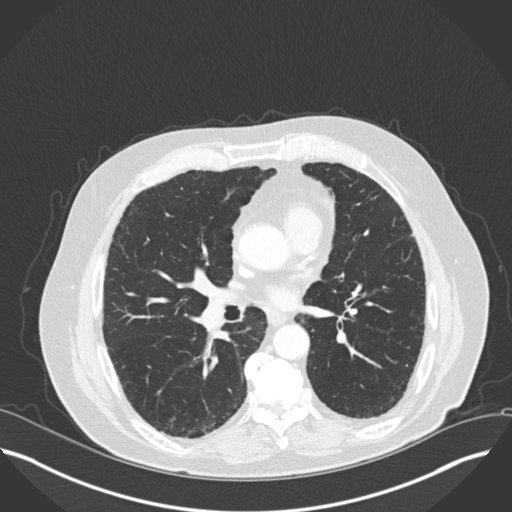
[im 101/170  lung]
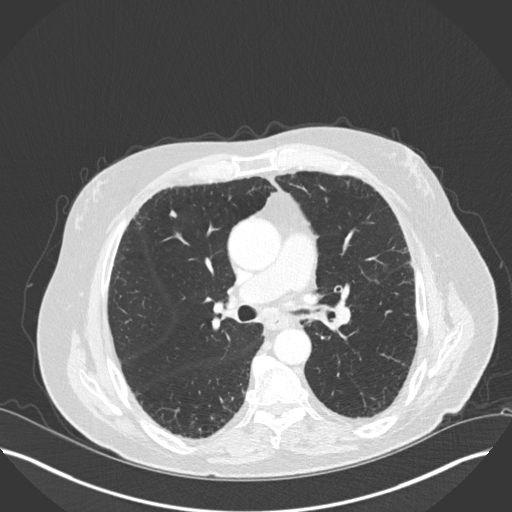
[im 113/170  lung]
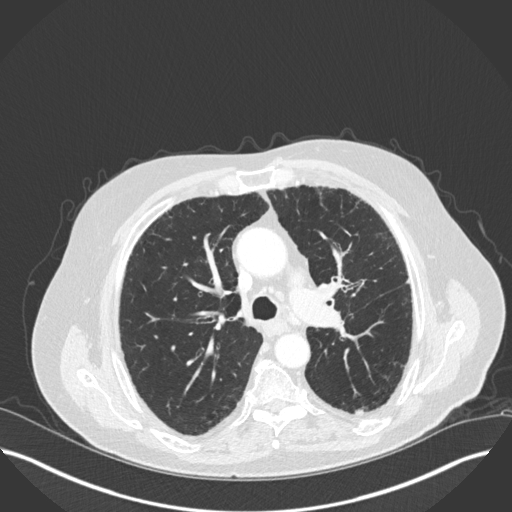
[im 132/170  mediastinal]
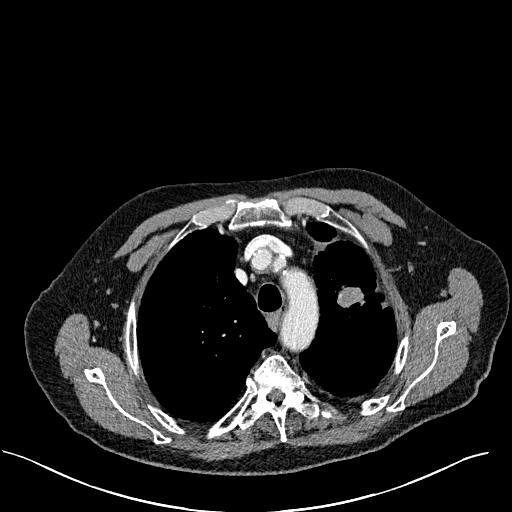
[im 132/170  lung]
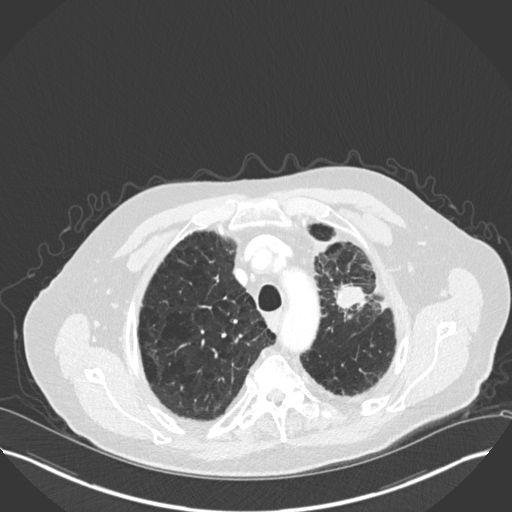
[im 144/170  lung]
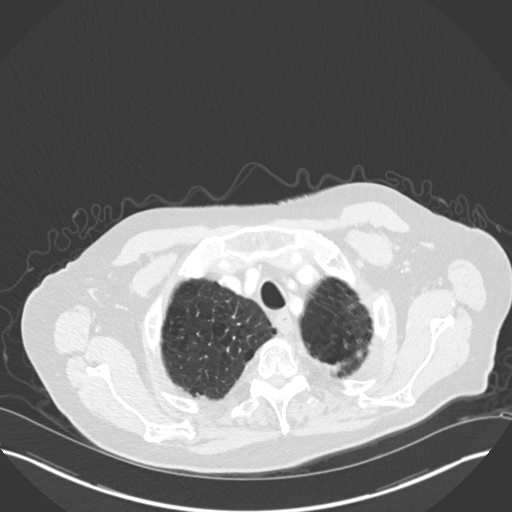
[im 157/170  lung]
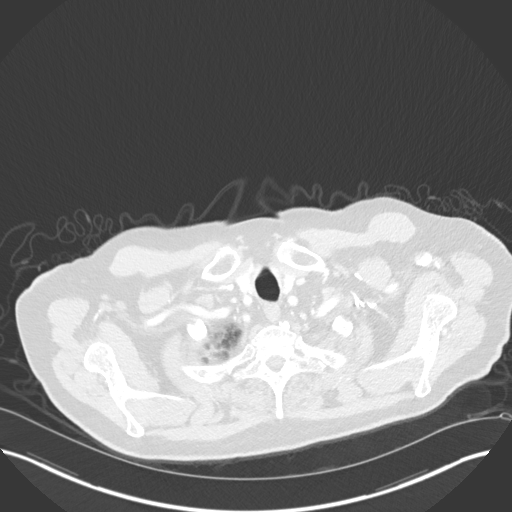

[Series 5: coronal · coronal · 0.68mm/px · 3 of 150 slices shown]
[im 30/150  lung]
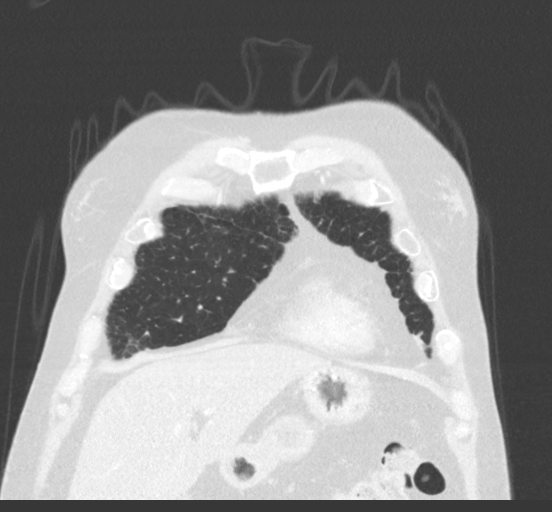
[im 60/150  lung]
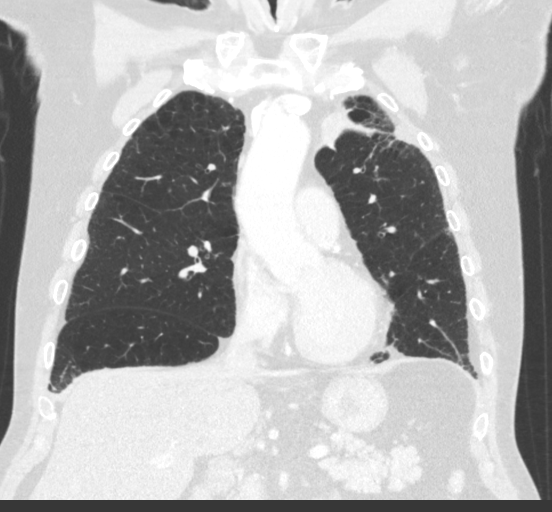
[im 90/150  lung]
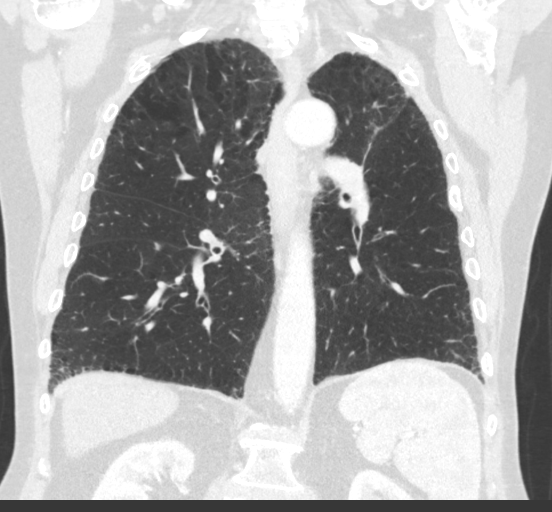

[14 of 36 positions shown; findings below may reference images not displayed]

FINDINGS: Cardiovascular: Atherosclerosis of the aorta, great vessels and
coronary arteries. No acute vascular findings. Probable
calcifications of the aortic valve. The heart size is normal. There
is no pericardial effusion.

Mediastinum/Nodes: There are no enlarged mediastinal, hilar or
axillary lymph nodes.Continued regression of previously demonstrated
mediastinal lymph nodes, the largest now measuring 9 mm in the left
periaortic region (image 54/2). The thyroid gland, trachea and
esophagus demonstrate no significant findings.

Lungs/Pleura: There is no pleural effusion. Moderate centrilobular
and paraseptal emphysema. The left upper lobe mass has decreased in
size. This has a superior component measuring 3.5 x 2.5 cm on image
43/7 and a more inferior component measuring 4.0 x 2.9 cm on image
50/7. There are mild radiation changes in the surrounding left upper
lobe. Stable small perifissural nodules along the minor fissure,
measuring up to 6 mm on image 70/7. No new or enlarging nodules.

Upper abdomen: Mild hepatic steatosis and calcified gallstones are
again noted. Small lymph nodes in the porta hepatis are stable. No
adrenal mass or acute findings.

Musculoskeletal/Chest wall: There is no chest wall mass or
suspicious osseous finding. Old left-sided rib fractures.
IMPRESSION: 1. Interval decreased size of left upper lobe mass with surrounding
radiation changes.
2. Continued regression of previously demonstrated mediastinal lymph
nodes, consistent with response to therapy.
3. No evidence of progressive metastatic disease.
4. Aortic Atherosclerosis (A3695-HL7.7) and Emphysema (A3695-LP2.F).

ADDENDUM:
Correction of clinical data: The patient underwent chemoradiation
from 12/10/2019 through 01/21/2020. Patient is now beginning
immunotherapy.

*** End of Addendum ***
FINDINGS: Cardiovascular: Atherosclerosis of the aorta, great vessels and
coronary arteries. No acute vascular findings. Probable
calcifications of the aortic valve. The heart size is normal. There
is no pericardial effusion.

Mediastinum/Nodes: There are no enlarged mediastinal, hilar or
axillary lymph nodes.Continued regression of previously demonstrated
mediastinal lymph nodes, the largest now measuring 9 mm in the left
periaortic region (image 54/2). The thyroid gland, trachea and
esophagus demonstrate no significant findings.

Lungs/Pleura: There is no pleural effusion. Moderate centrilobular
and paraseptal emphysema. The left upper lobe mass has decreased in
size. This has a superior component measuring 3.5 x 2.5 cm on image
43/7 and a more inferior component measuring 4.0 x 2.9 cm on image
50/7. There are mild radiation changes in the surrounding left upper
lobe. Stable small perifissural nodules along the minor fissure,
measuring up to 6 mm on image 70/7. No new or enlarging nodules.

Upper abdomen: Mild hepatic steatosis and calcified gallstones are
again noted. Small lymph nodes in the porta hepatis are stable. No
adrenal mass or acute findings.

Musculoskeletal/Chest wall: There is no chest wall mass or
suspicious osseous finding. Old left-sided rib fractures.
IMPRESSION: 1. Interval decreased size of left upper lobe mass with surrounding
radiation changes.
2. Continued regression of previously demonstrated mediastinal lymph
nodes, consistent with response to therapy.
3. No evidence of progressive metastatic disease.
4. Aortic Atherosclerosis (A3695-HL7.7) and Emphysema (A3695-LP2.F).

## 2021-04-15 DIAGNOSIS — G4733 Obstructive sleep apnea (adult) (pediatric): Secondary | ICD-10-CM | POA: Diagnosis not present

## 2021-04-15 DIAGNOSIS — J9601 Acute respiratory failure with hypoxia: Secondary | ICD-10-CM | POA: Diagnosis not present

## 2021-04-15 DIAGNOSIS — C3491 Malignant neoplasm of unspecified part of right bronchus or lung: Secondary | ICD-10-CM | POA: Diagnosis not present

## 2021-04-15 DIAGNOSIS — J8489 Other specified interstitial pulmonary diseases: Secondary | ICD-10-CM | POA: Diagnosis not present

## 2021-04-15 DIAGNOSIS — J449 Chronic obstructive pulmonary disease, unspecified: Secondary | ICD-10-CM | POA: Diagnosis not present

## 2021-04-15 DIAGNOSIS — I480 Paroxysmal atrial fibrillation: Secondary | ICD-10-CM | POA: Diagnosis not present

## 2021-04-29 ENCOUNTER — Other Ambulatory Visit: Payer: Self-pay

## 2021-04-29 ENCOUNTER — Encounter: Payer: Self-pay | Admitting: Internal Medicine

## 2021-04-29 ENCOUNTER — Ambulatory Visit (INDEPENDENT_AMBULATORY_CARE_PROVIDER_SITE_OTHER): Payer: Medicare Other | Admitting: Internal Medicine

## 2021-04-29 DIAGNOSIS — C3492 Malignant neoplasm of unspecified part of left bronchus or lung: Secondary | ICD-10-CM

## 2021-04-29 DIAGNOSIS — E039 Hypothyroidism, unspecified: Secondary | ICD-10-CM | POA: Diagnosis not present

## 2021-04-29 DIAGNOSIS — B351 Tinea unguium: Secondary | ICD-10-CM

## 2021-04-29 DIAGNOSIS — L308 Other specified dermatitis: Secondary | ICD-10-CM | POA: Diagnosis not present

## 2021-04-29 DIAGNOSIS — R0609 Other forms of dyspnea: Secondary | ICD-10-CM

## 2021-04-29 DIAGNOSIS — J441 Chronic obstructive pulmonary disease with (acute) exacerbation: Secondary | ICD-10-CM

## 2021-04-29 DIAGNOSIS — I10 Essential (primary) hypertension: Secondary | ICD-10-CM | POA: Diagnosis not present

## 2021-04-29 NOTE — Assessment & Plan Note (Signed)
BP Readings from Last 3 Encounters:  04/29/21 126/82  03/04/21 (!) 148/76  02/24/21 (!) 144/77    Cont on Norvasc, Atenolol

## 2021-04-29 NOTE — Progress Notes (Signed)
Subjective:  Patient ID: Joe [name], male    DOB: 15-Feb-1946  Age: 76 y.o. MRN: [id number]  CC: Follow-up   HPI TAEQUAN STOCKHAUSEN presents for COPD, lung cancer, hypoxemia, PAF S/p hosp stay in Nov 2022 C/o nail fungus C/o skin lesions.  He has a skin lesion got much better while he was on prednisone    Outpatient Medications Prior to Visit  Medication Sig Dispense Refill   albuterol (PROVENTIL) (2.5 MG/3ML) 0.083% nebulizer solution Take 3 mLs (2.5 mg total) by nebulization every 6 (six) hours as needed for wheezing or shortness of breath. 75 mL 12   amLODipine (NORVASC) 5 MG tablet Take 1 tablet (5 mg total) by mouth every evening. Please hold this medication for now, please check your blood pressure daily in the morning, bring in record for your pcp to review. Your pcp and you can decide on resumption or discontinuation of this medication     atenolol (TENORMIN) 25 MG tablet TAKE 1 TABLET BY MOUTH DAILY AS NEEDED FOR PALPITATIONS (Patient taking differently: Take 25 mg by mouth daily as needed (palpitations).) 90 tablet 3   Cholecalciferol 25 MCG (1000 UT) tablet Take 1,000 Units by mouth daily.     clotrimazole-betamethasone (LOTRISONE) cream Apply topically 2 (two) times daily. 45 g 2   diazepam (VALIUM) 5 MG tablet TAKE 1 TABLET(5 MG) BY MOUTH EVERY 12 HOURS (Patient taking differently: Take 5 mg by mouth every 12 (twelve) hours as needed for anxiety.) 180 tablet 1   ELIQUIS 5 MG TABS tablet TAKE 1 TABLET(5 MG) BY MOUTH TWICE DAILY (Patient taking differently: Take 5 mg by mouth 2 (two) times daily.) 180 tablet 1   Fluticasone-Umeclidin-Vilant (TRELEGY ELLIPTA) 100-62.5-25 MCG/ACT AEPB Inhale 1 puff into the lungs daily. 1 each 11   guaiFENesin (MUCINEX) 600 MG 12 hr tablet Take 1 tablet (600 mg total) by mouth 2 (two) times daily.     levothyroxine (SYNTHROID) 112 MCG tablet TAKE 1 TABLET(112 MCG) BY MOUTH DAILY 30 tablet 2   lovastatin (MEVACOR) 20 MG tablet TAKE 1 TABLET  BY MOUTH EVERY NIGHT AT BEDTIME (Patient taking differently: Take 20 mg by mouth at bedtime.) 90 tablet 2   Multiple Vitamin (MULTIVITAMIN) tablet Take 1 tablet by mouth daily. Centrum Silver.     Polyethyl Glycol-Propyl Glycol (SYSTANE) 0.4-0.3 % SOLN Place 1 drop into both eyes daily as needed (Dry eyes). Ultra     famotidine (PEPCID) 20 MG tablet Take 1 tablet (20 mg total) by mouth at bedtime. 30 tablet 1   predniSONE (DELTASONE) 10 MG tablet Label  & dispense according to the schedule below. Please take prednisone with breakfast daily until stopped. Take  6 Pills PO daily for three days then, 5 Pills PO daily for 7 days, then 4 Pills PO daily  for 7 days, then  3Pills PO daily for 7 days,then 2 Pills PO daily for 7 days, 1 Pills PO daily for 7 days,  then STOP , further instruction regarding prednisone to be determined by your pulmonologist Dr Verlee Monte. Total of 123 tabs (Patient not taking: Reported on 04/29/2021) 123 tablet 0   prochlorperazine (COMPAZINE) 10 MG tablet Take 1 tablet (10 mg total) by mouth every 6 (six) hours as needed. (Patient not taking: Reported on 04/29/2021) 30 tablet 2   No facility-administered medications prior to visit.    ROS: Review of Systems  Constitutional:  Positive for fatigue. Negative for appetite change and unexpected weight change.  HENT:  Negative for congestion, nosebleeds, sneezing, sore throat and trouble swallowing.   Eyes:  Negative for itching and visual disturbance.  Respiratory:  Positive for shortness of breath and wheezing. Negative for cough.   Cardiovascular:  Negative for chest pain, palpitations and leg swelling.  Gastrointestinal:  Negative for abdominal distention, blood in stool, diarrhea and nausea.  Genitourinary:  Negative for frequency and hematuria.  Musculoskeletal:  Negative for back pain, gait problem, joint swelling and neck pain.  Skin:  Positive for rash.  Neurological:  Negative for dizziness, tremors, speech difficulty and  weakness.  Psychiatric/Behavioral:  Negative for agitation, dysphoric mood, sleep disturbance and suicidal ideas. The patient is nervous/anxious.    Objective:  BP 126/82 (BP Location: Left Arm, Patient Position: Sitting, Cuff Size: Normal)    Pulse (!) 57    Temp 98.3 F (36.8 C) (Oral)    Ht 5\' 11"  (1.803 m)    Wt 187 lb (84.8 kg)    SpO2 98%    BMI 26.08 kg/m   BP Readings from Last 3 Encounters:  04/29/21 126/82  03/04/21 (!) 148/76  02/24/21 (!) 144/77    Wt Readings from Last 3 Encounters:  04/29/21 187 lb (84.8 kg)  03/04/21 180 lb 6.4 oz (81.8 kg)  02/24/21 182 lb 3.2 oz (82.6 kg)    Physical Exam Constitutional:      General: He is not in acute distress.    Appearance: He is well-developed. He is obese.     Comments: NAD  Eyes:     Conjunctiva/sclera: Conjunctivae normal.     Pupils: Pupils are equal, round, and reactive to light.  Neck:     Thyroid: No thyromegaly.     Vascular: No JVD.  Cardiovascular:     Rate and Rhythm: Normal rate and regular rhythm.     Heart sounds: Normal heart sounds. No murmur heard.   No friction rub. No gallop.  Pulmonary:     Effort: Pulmonary effort is normal. No respiratory distress.     Breath sounds: Normal breath sounds. No wheezing or rales.  Chest:     Chest wall: No tenderness.  Abdominal:     General: Bowel sounds are normal. There is no distension.     Palpations: Abdomen is soft. There is no mass.     Tenderness: There is no abdominal tenderness. There is no guarding or rebound.  Musculoskeletal:        General: No tenderness. Normal range of motion.     Cervical back: Normal range of motion.  Lymphadenopathy:     Cervical: No cervical adenopathy.  Skin:    General: Skin is warm and dry.     Findings: Lesion and rash present.  Neurological:     Mental Status: He is alert and oriented to person, place, and time.     Cranial Nerves: No cranial nerve deficit.     Motor: No abnormal muscle tone.     Coordination:  Coordination normal.     Gait: Gait abnormal.     Deep Tendon Reflexes: Reflexes are normal and symmetric.  Psychiatric:        Behavior: Behavior normal.        Thought Content: Thought content normal.        Judgment: Judgment normal.  O2 is on Skin and nail lesions-see pictures  Lab Results  Component Value Date   WBC 19.3 (H) 02/24/2021   HGB 14.7 02/24/2021   HCT 46.5 02/24/2021   PLT 334 02/24/2021  GLUCOSE 95 02/24/2021   CHOL 153 11/23/2018   TRIG 201.0 (H) 11/23/2018   HDL 38.20 (L) 11/23/2018   LDLDIRECT 99.0 11/23/2018   LDLCALC 80 11/07/2017   ALT 41 02/24/2021   AST 24 02/24/2021   NA 136 02/24/2021   K 4.1 02/24/2021   CL 100 02/24/2021   CREATININE 0.99 02/24/2021   BUN 12 02/24/2021   CO2 28 02/24/2021   TSH 2.085 01/19/2021   PSA 0.23 11/23/2018   INR 1.4 (H) 10/15/2019    CT Chest Wo Contrast  Result Date: 04/01/2021 CLINICAL DATA:  Evaluate pneumonia EXAM: CT CHEST WITHOUT CONTRAST TECHNIQUE: Multidetector CT imaging of the chest was performed following the standard protocol without IV contrast. RADIATION DOSE REDUCTION: This exam was performed according to the departmental dose-optimization program which includes automated exposure control, adjustment of the mA and/or kV according to patient size and/or use of iterative reconstruction technique. COMPARISON:  Chest CT dated February 09, 2021 FINDINGS: Cardiovascular: Normal heart size. No pericardial effusion. Left main and three-vessel coronary artery calcifications. Dilated ascending thoracic aorta, measuring up to 4.1 cm. Mediastinum/Nodes: No pathologically enlarged lymph nodes seen in the chest. Esophagus is unremarkable. Lungs/Pleura: Central airways are patent. Centrilobular emphysema. Unchanged paramediastinal opacities of the left upper lobe with associated traction bronchiectasis, compatible with post treatment changes. Interval decreased peripheral and lower lung predominant ground-glass opacities  and consolidations. Stable scattered small solid pulmonary nodules. Reference solid nodule of the right upper lobe measuring 7 mm on series 5, image 31, unchanged compared to prior exam. Upper Abdomen: Cholelithiasis. Stable mildly enlarged periportal lymph node, measuring 1.5 cm on series 2, image 161. No acute abnormality. Musculoskeletal: No chest wall mass or suspicious bone lesions identified. IMPRESSION: 1. Interval decreased peripheral and lower lung predominant ground-glass opacities and consolidations, findings are compatible with resolving organizing pneumonia. 2. Stable post treatment changes of the left upper lobe with no evidence of recurrent or metastatic disease. 3. Stable mildly dilated ascending thoracic aorta measuring up to 4.1 cm. Recommend annual imaging followup by CTA or MRA. This recommendation follows 2010 ACCF/AHA/AATS/ACR/ASA/SCA/SCAI/SIR/STS/SVM Guidelines for the Diagnosis and Management of Patients with Thoracic Aortic Disease. Circulation. 2010; 121: W098-J191. Aortic aneurysm NOS (ICD10-I71.9) 4. Coronary artery calcifications, aortic Atherosclerosis (ICD10-I70.0) and Emphysema (ICD10-J43.9). Electronically Signed   By: Yetta Glassman M.D.   On: 04/01/2021 08:28    Assessment & Plan:   Problem List Items Addressed This Visit     COPD exacerbation ([place])    Better. Finished steroids in Jan. 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  Got better on prednisone.  Discussed.  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  See pictures.  Got better on prednisone.  Discussed.  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     Follow-up: Return in about 3 months (around 07/27/2021) for a follow-up visit.  Walker Kehr, MD

## 2021-04-29 NOTE — Assessment & Plan Note (Addendum)
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  See pictures.  Got better on prednisone.  Discussed.  Offered referral to dermatology

## 2021-04-29 NOTE — Assessment & Plan Note (Signed)
Chronic Better on O2 and w/HHN

## 2021-04-29 NOTE — Assessment & Plan Note (Signed)
On Levothyroxine 

## 2021-04-29 NOTE — Assessment & Plan Note (Signed)
Better. Finished steroids in Jan. On O2 and HHN treatments

## 2021-04-29 NOTE — Assessment & Plan Note (Signed)
On O2 F/u w/Dr Julien Nordmann, Dr Sondra Come, Dr Verlee Monte

## 2021-05-01 NOTE — Assessment & Plan Note (Signed)
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  Got better on prednisone.  Discussed.  Offered referral to dermatology

## 2021-05-04 NOTE — Progress Notes (Signed)
Synopsis: Referred for organizing pneumonia by [name], [name], MD  Subjective:   PATIENT ID: Joe [name] GENDER: male DOB: 10/11/1945, MRN: [id number]  Chief Complaint  Patient presents with   Follow-up    PFT's done today.  Breathing has improved some since the last visit.    76yM with stage IIIb NSCLC, squamous cell carcinoma dx 10/2019 who originally presented with LUL mass and mediastinal LAD, contralateral R hilar hypermetabolism wh ois s/p chemoXRT now on consolidation IT with imfinzi s/p 13 cycles, CLL dx 06-29-14, COPD, pAF, hypothyroid, GERD, OSA on CPAP.  He says his breathing is ok. Discharged on O2 2L with exertion. O2 with exertion does improve his DOE. He doesn't have near as much cough. Not noticing any issue with worsening dyspnea as dose of prednisone is decreased (currently 30 mg daily since 2 days before visit). Getting home PT.   Was started on trelegy by PCP but he had reservations about taking it - concern over effect on HR, urinary retention.  Interval HPI: Last seen by me 03/04/21. CT Chest 03/31/21 with decreased burden of reticular peripheral opacities. Stable pulmonary nodules. Completed course of prednisone and home PT.  He says he's overall not doing bad, ok at rest. He does desaturate with minimal exertion. Minor cough has noted in morning. Some increased nasal drainage/crusting recently.   Otherwise pertinent review of systems is negative.  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   chronic stage 1- no symtoms   CLL (chronic lymphocytic leukemia) ([place]) 08/08/2014   2014-06-29 Dr Burr Medico Stage 0   COPD mixed type ([place]) 07/26/2007   Smoker - stopped 6/14    De Quervain's tenosynovitis, right 01-12-14   06-28-2013    Depression    at times   Dysrhythmia    PAF   Dysuria January 12, 2014   9/15 -  poss stricture Urol ref was offered    Gallstones 11/16/2017   Asymptomatic Pt refused surg ref   Generalized anxiety disorder 09/07/2012   Chronic   Potential benefits of a long term steroid  use as well as potential risks  and complications were explained to the patient and were aknowledged.      GERD 12/02/2006   Chronic      Grief 06-13-16   [name] died in 2014-06-29   Gynecomastia January 12, 2014   Benign B June 28, 2013    Hyperlipidemia    Hypertension    Hypothyroidism 12/25/2014   06/29/14 On Levothyroxine    Intertrigo 02/02/2012   11/13    Neoplasm of uncertain behavior of skin 03/12/2013   12/14 R ear, chest    OSA on CPAP    mild with AHI 9/hr and oxygen desats as low as 75%   Paresis ([place])    right- s/p cerv decompression   PERIORBITAL CELLULITIS 02/22/2009   Qualifier: Diagnosis of  By: Diona Browner MD, Amy     Retinal detachment    L>>R     Family History  Problem Relation Age of Onset   COPD Mother    Diabetes Father    Coronary artery disease Other    Breast cancer Paternal Aunt 77   Colon cancer Neg Hx      Past Surgical History:  Procedure Laterality Date   BICEPS TENDON REPAIR  BRONCHIAL BIOPSY  10/23/2019   Procedure: BRONCHIAL BIOPSIES;  Surgeon: [name], MD;  Location: [facility] ENDOSCOPY;  Service: Pulmonary;;   BRONCHIAL BIOPSY  11/20/2019   Procedure: BRONCHIAL BIOPSIES;  Surgeon: [name], MD;  Location[facility] END[facility]COPY;  Service: Pulmonary;;   BRONCHIAL BRUSHINGS  10/23/2019   Procedure: BRONCHIAL BRUSHINGS;  Surgeon: [name], MD;  Location: [facility] ENDOSCOPY;  Service: Pulmonary;;   BRONCHIAL BRUSHINGS  11/20/2019   Procedure: BRONCHIAL BRUSHINGS;  Surgeon: [name], MD;  Location: [facility] ENDOSCOPY;  Service: Pulmonary;;   BRONCHIAL NEEDLE ASPIRATION BIOPSY  10/23/2019   Procedure: BRONCHIAL NEEDLE ASPIRATION BIOPSIES;  Surgeon: [name], MD;  Location: [facility] ENDOSCOPY;  Service: Pulmonary;;   BRONCHIAL NEEDLE ASPIRATION BIOPSY  11/20/2019   Procedure:  BRONCHIAL NEEDLE ASPIRATION BIOPSIES;  Surgeon: [name], MD;  Location: [facility] ENDOSCOPY;  Service: Pulmonary;;   BRONCHIAL WASHINGS  11/20/2019   Procedure: BRONCHIAL WASHINGS;  Surgeon: [name], MD;  Location: [place];  Service: Pulmonary;;   CATARACT EXTRACTION Left    COLONOSCOPY  04-14-99   Dr Flossie Dibble polyp-TA in epic   POLYPECTOMY  04-14-99   POSTERIOR LAMINECTOMY / DECOMPRESSION CERVICAL SPINE     Dr Saintclair Halsted   RETINAL DETACHMENT SURGERY     left eye, 2007 x2, 2008 x 3   ROTATOR CUFF REPAIR  2004   right   TONSILLECTOMY  2992,4268   VIDEO BRONCHOSCOPY WITH ENDOBRONCHIAL NAVIGATION N/A 10/23/2019   Procedure: VIDEO BRONCHOSCOPY WITH ENDOBRONCHIAL NAVIGATION;  Surgeon: [name], MD;  Location: [facility] ENDOSCOPY;  Service: Pulmonary;  Laterality: N/A;   VIDEO BRONCHOSCOPY WITH ENDOBRONCHIAL NAVIGATION N/A 11/20/2019   Procedure: VIDEO BRONCHOSCOPY WITH ENDOBRONCHIAL NAVIGATION;  Surgeon: [name], MD;  Location: [place] ENDOSCOPY;  Service: Pulmonary;  Laterality: N/A;   VIDEO BRONCHOSCOPY WITH ENDOBRONCHIAL ULTRASOUND N/A 10/23/2019   Procedure: VIDEO BRONCHOSCOPY WITH ENDOBRONCHIAL ULTRASOUND;  Surgeon: [name], MD;  Location: [place] ENDOSCOPY;  Service: Pulmonary;  Laterality: N/A;    Social History   Socioeconomic History   Marital status: Widowed    Spouse name: Not on file   Number of children: 1   Years of education: Not on file   Highest education level: Not on file  Occupational History   Occupation: retired  Tobacco Use   Smoking status: Former    Packs/day: 0.80    Years: 50.00    Pack years: 40.00    Types: Cigarettes    Quit date: 08/27/2012    Years since quitting: 8.6   Smokeless tobacco: Never  Vaping Use   Vaping Use: Never used  Substance and Sexual Activity   Alcohol use: Not Currently   Drug use: No   Sexual activity: Not Currently  Other Topics Concern   Not on file  Social History Narrative   Not on file   Social Determinants  of Health   Financial Resource Strain: Not on file  Food Insecurity: Not on file  Transportation Needs: Not on file  Physical Activity: Not on file  Stress: Not on file  Social Connections: Not on file  Intimate Partner Violence: Not on file     No Known Allergies   Outpatient Medications Prior to Visit  Medication Sig Dispense Refill   albuterol (PROVENTIL) (2.5 MG/3ML) 0.083% nebulizer solution Take 3 mLs (2.5 mg total) by nebulization every 6 (six) hours as needed for wheezing or shortness of breath. 75 mL 12   amLODipine (NORVASC) 5 MG tablet Take 1  tablet (5 mg total) by mouth every evening. Please hold this medication for now, please check your blood pressure daily in the morning, bring in record for your pcp to review. Your pcp and you can decide on resumption or discontinuation of this medication     atenolol (TENORMIN) 25 MG tablet TAKE 1 TABLET BY MOUTH DAILY AS NEEDED FOR PALPITATIONS (Patient taking differently: Take 25 mg by mouth daily as needed (palpitations).) 90 tablet 3   Cholecalciferol 25 MCG (1000 UT) tablet Take 1,000 Units by mouth daily.     clotrimazole-betamethasone (LOTRISONE) cream Apply topically 2 (two) times daily. 45 g 2   diazepam (VALIUM) 5 MG tablet TAKE 1 TABLET(5 MG) BY MOUTH EVERY 12 HOURS (Patient taking differently: Take 5 mg by mouth every 12 (twelve) hours as needed for anxiety.) 180 tablet 1   ELIQUIS 5 MG TABS tablet TAKE 1 TABLET(5 MG) BY MOUTH TWICE DAILY (Patient taking differently: Take 5 mg by mouth 2 (two) times daily.) 180 tablet 1   guaiFENesin (MUCINEX) 600 MG 12 hr tablet Take 1 tablet (600 mg total) by mouth 2 (two) times daily.     levothyroxine (SYNTHROID) 112 MCG tablet TAKE 1 TABLET(112 MCG) BY MOUTH DAILY 30 tablet 2   lovastatin (MEVACOR) 20 MG tablet TAKE 1 TABLET BY MOUTH EVERY NIGHT AT BEDTIME (Patient taking differently: Take 20 mg by mouth at bedtime.) 90 tablet 2   Multiple Vitamin (MULTIVITAMIN) tablet Take 1 tablet by mouth  daily. Centrum Silver.     Polyethyl Glycol-Propyl Glycol (SYSTANE) 0.4-0.3 % SOLN Place 1 drop into both eyes daily as needed (Dry eyes). Ultra     famotidine (PEPCID) 20 MG tablet Take 1 tablet (20 mg total) by mouth at bedtime. 30 tablet 1   Fluticasone-Umeclidin-Vilant (TRELEGY ELLIPTA) 100-62.5-25 MCG/ACT AEPB Inhale 1 puff into the lungs daily. (Patient not taking: Reported on 05/05/2021) 1 each 11   predniSONE (DELTASONE) 10 MG tablet Label  & dispense according to the schedule below. Please take prednisone with breakfast daily until stopped. Take  6 Pills PO daily for three days then, 5 Pills PO daily for 7 days, then 4 Pills PO daily  for 7 days, then  3Pills PO daily for 7 days,then 2 Pills PO daily for 7 days, 1 Pills PO daily for 7 days,  then STOP , further instruction regarding prednisone to be determined by your pulmonologist Dr Verlee Monte. Total of 123 tabs 123 tablet 0   prochlorperazine (COMPAZINE) 10 MG tablet Take 1 tablet (10 mg total) by mouth every 6 (six) hours as needed. 30 tablet 2   No facility-administered medications prior to visit.       Objective:   Physical Exam:  General appearance: 76 y.o., male, NAD, conversant  Eyes: anicteric sclerae; PERRL, tracking appropriately HENT: NCAT; MMM Neck: Trachea midline; no lymphadenopathy, no JVD Lungs: Faint expiratory wheeze bl, with normal respiratory effort CV: RRR, no murmur  Abdomen: Soft, non-tender; non-distended, BS present  Extremities: No peripheral edema, warm Skin: Normal turgor and texture; no rash Psych: Appropriate affect Neuro: Alert and oriented to person and place, no focal deficit     Vitals:   05/05/21 1348  BP: 134/68  Pulse: 83  Temp: 98.2 F (36.8 C)  TempSrc: Oral  SpO2: 94%  Weight: 189 lb (85.7 kg)  Height: 5\' 11"  (1.803 m)    94% on 2 LPM  BMI Readings from Last 3 Encounters:  05/05/21 26.36 kg/m  04/29/21 26.08 kg/m  03/04/21 25.16  kg/m   Wt Readings from Last 3  Encounters:  05/05/21 189 lb (85.7 kg)  04/29/21 187 lb (84.8 kg)  03/04/21 180 lb 6.4 oz (81.8 kg)     CBC    Component Value Date/Time   WBC 19.3 (H) 02/24/2021 1337   WBC 19.0 Repeated and verified X2. (HH) 02/24/2021 1029   RBC 5.17 02/24/2021 1337   HGB 14.7 02/24/2021 1337   HGB 14.8 02/24/2021 1029   HGB 17.2 (H) 03/10/2017 0911   HCT 46.5 02/24/2021 1337   HCT 52.5 (H) 03/10/2017 0911   PLT 334 02/24/2021 1337   PLT 371.0 02/24/2021 1029   PLT 241 03/10/2017 0911   MCV 89.9 02/24/2021 1337   MCV 96.7 03/10/2017 0911   MCH 28.4 02/24/2021 1337   MCHC 31.6 02/24/2021 1337   RDW 14.4 02/24/2021 1337   RDW 13.6 03/10/2017 0911   LYMPHSABS 3.9 02/24/2021 1337   LYMPHSABS 9.8 (H) 03/10/2017 0911   MONOABS 1.1 (H) 02/24/2021 1337   MONOABS 1.0 (H) 03/10/2017 0911   EOSABS 0.0 02/24/2021 1337   EOSABS 0.3 03/10/2017 0911   BASOSABS 0.0 02/24/2021 1337   BASOSABS 0.1 03/10/2017 0911     Chest Imaging: CTA Chest 02/09/21 reviewed by me with peripheral/subpleural consolidation  CT Chest 03/2021 with decreased burden of peripherap/subpleural reticular/consolidative opacities  Pulmonary Functions Testing Results: PFT Results Latest Ref Rng & Units 05/05/2021 01/02/2020  FVC-Pre L 2.44 2.61  FVC-Predicted Pre % 55 58  FVC-Post L 2.75 3.13  FVC-Predicted Post % 62 69  Pre FEV1/FVC % % 52 54  Post FEV1/FCV % % 52 53  FEV1-Pre L 1.27 1.41  FEV1-Predicted Pre % 39 43  FEV1-Post L 1.42 1.66  DLCO uncorrected ml/min/mmHg 13.56 16.70  DLCO UNC% % 52 63  DLCO corrected ml/min/mmHg 13.56 16.56  DLCO COR %Predicted % 52 63  DLVA Predicted % 77 84  TLC L 6.02 6.87  TLC % Predicted % 83 94  RV % Predicted % 119 153   Reviewed by me with some decline in lung function since last PFT but not clinically significant  Echocardiogram:   TTE 1. Left ventricular ejection fraction, by estimation, is 60 to 65%. Left  ventricular ejection fraction by 3D volume is 64 %. The left  ventricle has  normal function. The left ventricle has no regional wall motion  abnormalities. Left ventricular diastolic   parameters are consistent with Grade I diastolic dysfunction (impaired  relaxation). The average left ventricular global longitudinal strain is  -23.0 %. The global longitudinal strain is normal.   2. Right ventricular systolic function is normal. The right ventricular  size is normal.   3. The mitral valve is grossly normal. Trivial mitral valve  regurgitation.   4. The aortic valve is tricuspid. Aortic valve regurgitation is mild to  moderate. Aortic valve sclerosis is present, with no evidence of aortic  valve stenosis. Aortic valve mean gradient measures 10.0 mmHg.   5. Aortic dilatation noted. There is borderline dilatation of the aortic  root, measuring 39 mm. There is mild dilatation of the ascending aorta,  measuring 42 mm.   6. The inferior vena cava is normal in size with greater than 50%       Assessment & Plan:   # Organizing pneumonia:  Likely related to imfinzi, would be grade 3 pneumonitis. Resolved.  # COPD gold functional group B  # Chronic hypoxic respiratory failure: 2L with exertion. DME supplier [employer].  # Stage IIIb NSCLC  s/p chemoXRT and imfinzi:  Plan: - declines pulmonary rehab, will continue his own home exercises -  have encouraged to try trelegy and discussed benefits re: decreased risk of AECOPD and mortality relative to non-triple therapy in advanced COPD, he declines, he prefers to instead use albuterol neb twice daily - He's interested in getting portable concentrator but would like to keep his current supplies. He would be willing to pay out of pocket to have both.  - post treatment cancer surveillance per oncology   RTC 6 months      Maryjane Hurter, MD [facility] 05/05/2021 2:00 PM

## 2021-05-05 ENCOUNTER — Ambulatory Visit (INDEPENDENT_AMBULATORY_CARE_PROVIDER_SITE_OTHER): Payer: Medicare Other | Admitting: Student

## 2021-05-05 ENCOUNTER — Other Ambulatory Visit: Payer: Self-pay

## 2021-05-05 ENCOUNTER — Encounter: Payer: Self-pay | Admitting: Student

## 2021-05-05 VITALS — BP 134/68 | HR 83 | Temp 98.2°F | Ht 71.0 in | Wt 189.0 lb

## 2021-05-05 DIAGNOSIS — J449 Chronic obstructive pulmonary disease, unspecified: Secondary | ICD-10-CM

## 2021-05-05 DIAGNOSIS — J9611 Chronic respiratory failure with hypoxia: Secondary | ICD-10-CM

## 2021-05-05 LAB — PULMONARY FUNCTION TEST
DL/VA % pred: 77 %
DL/VA: 3.06 ml/min/mmHg/L
DLCO cor % pred: 52 %
DLCO cor: 13.56 ml/min/mmHg
DLCO unc % pred: 52 %
DLCO unc: 13.56 ml/min/mmHg
FEF 25-75 Post: 0.85 L/sec
FEF 25-75 Pre: 0.67 L/sec
FEF2575-%Change-Post: 27 %
FEF2575-%Pred-Post: 36 %
FEF2575-%Pred-Pre: 28 %
FEV1-%Change-Post: 11 %
FEV1-%Pred-Post: 44 %
FEV1-%Pred-Pre: 39 %
FEV1-Post: 1.42 L
FEV1-Pre: 1.27 L
FEV1FVC-%Change-Post: 0 %
FEV1FVC-%Pred-Pre: 71 %
FEV6-%Change-Post: 12 %
FEV6-%Pred-Post: 66 %
FEV6-%Pred-Pre: 58 %
FEV6-Post: 2.75 L
FEV6-Pre: 2.44 L
FEV6FVC-%Change-Post: 0 %
FEV6FVC-%Pred-Post: 106 %
FEV6FVC-%Pred-Pre: 106 %
FVC-%Change-Post: 12 %
FVC-%Pred-Post: 62 %
FVC-%Pred-Pre: 55 %
FVC-Post: 2.75 L
FVC-Pre: 2.44 L
Post FEV1/FVC ratio: 52 %
Post FEV6/FVC ratio: 100 %
Pre FEV1/FVC ratio: 52 %
Pre FEV6/FVC Ratio: 100 %
RV % pred: 119 %
RV: 3.12 L
TLC % pred: 83 %
TLC: 6.02 L

## 2021-05-05 NOTE — Patient Instructions (Signed)
Full PFT performed today. °

## 2021-05-05 NOTE — Progress Notes (Signed)
Full PFT performed today. °

## 2021-05-05 NOTE — Patient Instructions (Signed)
-   Call us when you want [place] to order portable concentrator or If you'd like to pursue pulmonary rehab - See you in 6 months

## 2021-05-06 ENCOUNTER — Telehealth: Payer: Self-pay | Admitting: Student

## 2021-05-07 NOTE — Telephone Encounter (Signed)
I have not received anything from [employer] on this pt.  Called number provided and msg says that the call center is not currently available  Will call back

## 2021-05-07 NOTE — Telephone Encounter (Signed)
I received the fax and sent this to [employer]. Nothing further needed.

## 2021-05-07 NOTE — Telephone Encounter (Signed)
Magda Paganini, has this been received? Thanks!

## 2021-05-22 ENCOUNTER — Other Ambulatory Visit: Payer: Self-pay | Admitting: Physician Assistant

## 2021-05-22 DIAGNOSIS — E039 Hypothyroidism, unspecified: Secondary | ICD-10-CM

## 2021-05-28 ENCOUNTER — Ambulatory Visit: Payer: Medicare Other | Admitting: Cardiology

## 2021-06-15 ENCOUNTER — Other Ambulatory Visit: Payer: Self-pay | Admitting: Cardiology

## 2021-06-15 NOTE — Telephone Encounter (Signed)
Eliquis 5 mg refill request received. Patient is 76 years old, weight- 85.7 kg, Crea- 0.99 on 02/24/21, Diagnosis- PAF, and last seen by Dr. Radford Pax on 05/26/20 - next upcoming appt 07/22/21. Dose is appropriate based on dosing criteria. Will send in refill to requested pharmacy.   ?

## 2021-06-18 ENCOUNTER — Encounter (INDEPENDENT_AMBULATORY_CARE_PROVIDER_SITE_OTHER): Payer: Medicare Other | Admitting: Ophthalmology

## 2021-06-18 DIAGNOSIS — I10 Essential (primary) hypertension: Secondary | ICD-10-CM

## 2021-06-18 DIAGNOSIS — H348312 Tributary (branch) retinal vein occlusion, right eye, stable: Secondary | ICD-10-CM

## 2021-06-18 DIAGNOSIS — H35033 Hypertensive retinopathy, bilateral: Secondary | ICD-10-CM | POA: Diagnosis not present

## 2021-06-18 DIAGNOSIS — H43811 Vitreous degeneration, right eye: Secondary | ICD-10-CM | POA: Diagnosis not present

## 2021-06-18 DIAGNOSIS — H338 Other retinal detachments: Secondary | ICD-10-CM | POA: Diagnosis not present

## 2021-06-30 ENCOUNTER — Other Ambulatory Visit: Payer: Self-pay

## 2021-06-30 ENCOUNTER — Ambulatory Visit (HOSPITAL_COMMUNITY)
Admission: RE | Admit: 2021-06-30 | Discharge: 2021-06-30 | Disposition: A | Discharge status: Home / Self Care | Payer: Medicare Other | Source: Ambulatory Visit | Attending: Physician Assistant | Admitting: Physician Assistant

## 2021-06-30 ENCOUNTER — Inpatient Hospital Stay: Payer: Medicare Other | Attending: Internal Medicine

## 2021-06-30 DIAGNOSIS — Z923 Personal history of irradiation: Secondary | ICD-10-CM | POA: Diagnosis not present

## 2021-06-30 DIAGNOSIS — C3492 Malignant neoplasm of unspecified part of left bronchus or lung: Secondary | ICD-10-CM | POA: Diagnosis not present

## 2021-06-30 DIAGNOSIS — Z9221 Personal history of antineoplastic chemotherapy: Secondary | ICD-10-CM | POA: Insufficient documentation

## 2021-06-30 DIAGNOSIS — Z9981 Dependence on supplemental oxygen: Secondary | ICD-10-CM | POA: Diagnosis not present

## 2021-06-30 DIAGNOSIS — R0609 Other forms of dyspnea: Secondary | ICD-10-CM | POA: Diagnosis not present

## 2021-06-30 DIAGNOSIS — J439 Emphysema, unspecified: Secondary | ICD-10-CM | POA: Insufficient documentation

## 2021-06-30 DIAGNOSIS — R911 Solitary pulmonary nodule: Secondary | ICD-10-CM | POA: Diagnosis not present

## 2021-06-30 DIAGNOSIS — C3412 Malignant neoplasm of upper lobe, left bronchus or lung: Secondary | ICD-10-CM | POA: Insufficient documentation

## 2021-06-30 DIAGNOSIS — E039 Hypothyroidism, unspecified: Secondary | ICD-10-CM | POA: Insufficient documentation

## 2021-06-30 DIAGNOSIS — I7121 Aneurysm of the ascending aorta, without rupture: Secondary | ICD-10-CM | POA: Diagnosis not present

## 2021-06-30 DIAGNOSIS — R918 Other nonspecific abnormal finding of lung field: Secondary | ICD-10-CM | POA: Diagnosis not present

## 2021-06-30 DIAGNOSIS — I700 Atherosclerosis of aorta: Secondary | ICD-10-CM | POA: Diagnosis not present

## 2021-06-30 DIAGNOSIS — I10 Essential (primary) hypertension: Secondary | ICD-10-CM | POA: Insufficient documentation

## 2021-06-30 DIAGNOSIS — C3490 Malignant neoplasm of unspecified part of unspecified bronchus or lung: Secondary | ICD-10-CM | POA: Diagnosis not present

## 2021-06-30 DIAGNOSIS — C9110 Chronic lymphocytic leukemia of B-cell type not having achieved remission: Secondary | ICD-10-CM | POA: Insufficient documentation

## 2021-06-30 LAB — CBC WITH DIFFERENTIAL (CANCER CENTER ONLY)
Abs Immature Granulocytes: 0.04 10*3/uL (ref 0.00–0.07)
Basophils Absolute: 0.1 10*3/uL (ref 0.0–0.1)
Basophils Relative: 1 %
Eosinophils Absolute: 0.1 10*3/uL (ref 0.0–0.5)
Eosinophils Relative: 1 %
HCT: 45 % (ref 39.0–52.0)
Hemoglobin: 14.5 g/dL (ref 13.0–17.0)
Immature Granulocytes: 0 %
Lymphocytes Relative: 17 %
Lymphs Abs: 2.2 10*3/uL (ref 0.7–4.0)
MCH: 29.1 pg (ref 26.0–34.0)
MCHC: 32.2 g/dL (ref 30.0–36.0)
MCV: 90.4 fL (ref 80.0–100.0)
Monocytes Absolute: 1.6 10*3/uL — ABNORMAL HIGH (ref 0.1–1.0)
Monocytes Relative: 12 %
Neutro Abs: 9.1 10*3/uL — ABNORMAL HIGH (ref 1.7–7.7)
Neutrophils Relative %: 69 %
Platelet Count: 300 10*3/uL (ref 150–400)
RBC: 4.98 MIL/uL (ref 4.22–5.81)
RDW: 12.9 % (ref 11.5–15.5)
WBC Count: 13.1 10*3/uL — ABNORMAL HIGH (ref 4.0–10.5)
nRBC: 0 % (ref 0.0–0.2)

## 2021-06-30 LAB — CMP (CANCER CENTER ONLY)
ALT: 9 U/L (ref 0–44)
AST: 16 U/L (ref 15–41)
Albumin: 4.1 g/dL (ref 3.5–5.0)
Alkaline Phosphatase: 83 U/L (ref 38–126)
Anion gap: 8 (ref 5–15)
BUN: 13 mg/dL (ref 8–23)
CO2: 30 mmol/L (ref 22–32)
Calcium: 9.8 mg/dL (ref 8.9–10.3)
Chloride: 100 mmol/L (ref 98–111)
Creatinine: 0.77 mg/dL (ref 0.61–1.24)
GFR, Estimated: >60 mL/min (ref >60)
Glucose, Bld: 95 mg/dL (ref 70–99)
Potassium: 4.3 mmol/L (ref 3.5–5.1)
Sodium: 138 mmol/L (ref 135–145)
Total Bilirubin: 1.6 mg/dL — ABNORMAL HIGH (ref 0.3–1.2)
Total Protein: 7.5 g/dL (ref 6.5–8.1)

## 2021-06-30 LAB — TSH: TSH: 6.066 u[IU]/mL — ABNORMAL HIGH (ref 0.320–4.118)

## 2021-06-30 MED ORDER — IOHEXOL 300 MG/ML  SOLN
75.0000 mL | Freq: Once | INTRAMUSCULAR | Status: AC | PRN
  Administered 2021-06-30: 75 mL via INTRAVENOUS

## 2021-06-30 MED ORDER — SODIUM CHLORIDE (PF) 0.9 % IJ SOLN
INTRAMUSCULAR | Status: AC
  Filled 2021-06-30: qty 50

## 2021-07-02 ENCOUNTER — Inpatient Hospital Stay (HOSPITAL_BASED_OUTPATIENT_CLINIC_OR_DEPARTMENT_OTHER): Payer: Medicare Other | Admitting: Internal Medicine

## 2021-07-02 ENCOUNTER — Other Ambulatory Visit: Payer: Self-pay

## 2021-07-02 VITALS — BP 138/71 | HR 75 | Temp 97.2°F | Resp 19 | Ht 71.0 in | Wt 191.9 lb

## 2021-07-02 DIAGNOSIS — I10 Essential (primary) hypertension: Secondary | ICD-10-CM | POA: Diagnosis not present

## 2021-07-02 DIAGNOSIS — C3412 Malignant neoplasm of upper lobe, left bronchus or lung: Secondary | ICD-10-CM | POA: Diagnosis not present

## 2021-07-02 DIAGNOSIS — C3492 Malignant neoplasm of unspecified part of left bronchus or lung: Secondary | ICD-10-CM

## 2021-07-02 DIAGNOSIS — C9110 Chronic lymphocytic leukemia of B-cell type not having achieved remission: Secondary | ICD-10-CM | POA: Diagnosis not present

## 2021-07-02 DIAGNOSIS — C3490 Malignant neoplasm of unspecified part of unspecified bronchus or lung: Secondary | ICD-10-CM

## 2021-07-02 DIAGNOSIS — E039 Hypothyroidism, unspecified: Secondary | ICD-10-CM | POA: Diagnosis not present

## 2021-07-02 NOTE — Progress Notes (Signed)
?    [facility] ?[phone]   [phone] ? ?OFFICE PROGRESS NOTE ? ?[name], [name], MD ?[place] [address] ? ?DIAGNOSIS: 1) Stage IIIb non-small cell lung cancer, squamous cell carcinoma.  The patient presented with a left upper lobe lung mass as well as associated mediastinal lymphadenopathy and contralateral right hilar mild hypermetabolism.  There was heterogeneous marrow uptake in the spine but with more focal areas at L4 without imaging correlation on CT scan.  The patient was diagnosed in August 2021. ?2) CLL stage 0, diagnosed in 05-24-2014 ?  ?PD-L1 expression: 2% ?  ?PRIOR THERAPY:  ?1) Concurrent chemoradiation with carboplatin for an AUC of 2 and paclitaxel 45 mg per metered squared weekly.  First dose expected on 12/13/2019. Status post 5 cycles.   Last dose was giving January 14, 2020. ?2) Consolidation treatment with immunotherapy with Imfinzi 1500 mg IV every 4 weeks.  First dose February 19, 2020.  Status post 13 cycles. ?  ?CURRENT THERAPY: Observation. ? ?INTERVAL HISTORY: ?Joe [name] 76 y.o. male returns to the clinic today for follow-up visit.  The patient is feeling fine today with no concerning complaints except for the baseline shortness of breath and he is currently on home oxygen.  He is still able to do some activity without oxygen.  He denied having any chest pain, cough or hemoptysis.  He denied having any fever or chills.  He has no nausea, vomiting, diarrhea or constipation.  He has no headache or visual changes.  He is currently on observation and feeling well.  He had repeat CT scan of the chest performed recently and is here for evaluation and discussion of his scan results. ? ?MEDICAL HISTORY: ?Past Medical History:  ?Diagnosis Date  ? Alcohol abuse, daily use 08/24/2010  ? Stopped 05/23/2013   ? Arthralgia 2013-12-15  ? 9/15 Not related to statins OA   ? Cataract   ? 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 chronic  stage 1- no symtoms  ? CLL (chronic lymphocytic leukemia) ([place]) 08/08/2014  ? 05-24-2014 Dr Burr Medico Stage 0  ? COPD mixed type ([place]) 07/26/2007  ? Smoker - stopped 6/14   ? De Quervain's tenosynovitis, right 12-15-13  ? 23-May-2013   ? Depression   ? at times  ? Dysrhythmia   ? PAF  ? Dysuria 12-15-2013  ? 9/15 - poss stricture Urol ref was offered   ? Gallstones 11/16/2017  ? Asymptomatic Pt refused surg ref  ? Generalized anxiety disorder 09/07/2012  ? Chronic   Potential benefits of a long term steroid  use as well as potential risks  and complications were explained to the patient and were aknowledged.     ? GERD 12/02/2006  ? Chronic     ? Grief 05/16/2016  ? [name] died in 2014/05/24  ? Gynecomastia 12/15/13  ? Benign B 2013/05/23   ? Hyperlipidemia   ? Hypertension   ? Hypothyroidism 12/25/2014  ? May 24, 2014 On Levothyroxine   ? Intertrigo 02/02/2012  ? 11/13   ? Neoplasm of uncertain behavior of skin 03/12/2013  ? 12/14 R ear, chest   ? OSA on CPAP   ? mild with AHI 9/hr and oxygen desats as low as 75%  ? Paresis ([place])   ? right- s/p cerv decompression  ? PERIORBITAL CELLULITIS 02/22/2009  ? Qualifier: Diagnosis of  By: Diona Browner MD, Amy    ? Retinal detachment   ? L>>R  ? ? ?  ALLERGIES:  has No Known Allergies. ? ?MEDICATIONS:  ?Current Outpatient Medications  ?Medication Sig Dispense Refill  ? albuterol (PROVENTIL) (2.5 MG/3ML) 0.083% nebulizer solution Take 3 mLs (2.5 mg total) by nebulization every 6 (six) hours as needed for wheezing or shortness of breath. 75 mL 12  ? amLODipine (NORVASC) 5 MG tablet Take 1 tablet (5 mg total) by mouth every evening. Please hold this medication for now, please check your blood pressure daily in the morning, bring in record for your pcp to review. Your pcp and you can decide on resumption or discontinuation of this medication    ? apixaban (ELIQUIS) 5 MG TABS tablet TAKE 1 TABLET(5 MG) BY MOUTH TWICE DAILY 180 tablet 0  ? atenolol (TENORMIN) 25 MG tablet TAKE 1 TABLET BY MOUTH DAILY AS NEEDED FOR PALPITATIONS  (Patient taking differently: Take 25 mg by mouth daily as needed (palpitations).) 90 tablet 3  ? Cholecalciferol 25 MCG (1000 UT) tablet Take 1,000 Units by mouth daily.    ? clotrimazole-betamethasone (LOTRISONE) cream Apply topically 2 (two) times daily. 45 g 2  ? diazepam (VALIUM) 5 MG tablet TAKE 1 TABLET(5 MG) BY MOUTH EVERY 12 HOURS (Patient taking differently: Take 5 mg by mouth every 12 (twelve) hours as needed for anxiety.) 180 tablet 1  ? famotidine (PEPCID) 20 MG tablet Take 1 tablet (20 mg total) by mouth at bedtime. 30 tablet 1  ? Fluticasone-Umeclidin-Vilant (TRELEGY ELLIPTA) 100-62.5-25 MCG/ACT AEPB Inhale 1 puff into the lungs daily. (Patient not taking: Reported on 05/05/2021) 1 each 11  ? guaiFENesin (MUCINEX) 600 MG 12 hr tablet Take 1 tablet (600 mg total) by mouth 2 (two) times daily.    ? levothyroxine (SYNTHROID) 112 MCG tablet TAKE 1 TABLET(112 MCG) BY MOUTH DAILY 30 tablet 2  ? lovastatin (MEVACOR) 20 MG tablet TAKE 1 TABLET BY MOUTH EVERY NIGHT AT BEDTIME (Patient taking differently: Take 20 mg by mouth at bedtime.) 90 tablet 2  ? Multiple Vitamin (MULTIVITAMIN) tablet Take 1 tablet by mouth daily. Centrum Silver.    ? Polyethyl Glycol-Propyl Glycol (SYSTANE) 0.4-0.3 % SOLN Place 1 drop into both eyes daily as needed (Dry eyes). Ultra    ? ?No current facility-administered medications for this visit.  ? ? ?SURGICAL HISTORY:  ?Past Surgical History:  ?Procedure Laterality Date  ? BICEPS TENDON REPAIR    ? BRONCHIAL BIOPSY  10/23/2019  ? Procedure: BRONCHIAL BIOPSIES;  Surgeon: [name], MD;  Location: [facility] ENDOSCOPY;  Service: Pulmonary;;  ? BRONCHIAL BIOPSY  11/20/2019  ? Procedure: BRONCHIAL BIOPSIES;  Surgeon: [name], MD;  Location: [facility] ENDOSCOPY;  Service: Pulmonary;;  ? BRONCHIAL BRUSHINGS  10/23/2019  ? Procedure: BRONCHIAL BRUSHINGS;  Surgeon: [name], MD;  Location: [facility] ENDOSCOPY;  Service: Pulmonary;;  ? BRONCHIAL BRUSHINGS  11/20/2019  ? Procedure: BRONCHIAL BRUSHINGS;   Surgeon: [name], MD;  Location: [facility] ENDOSCOPY;  Service: Pulmonary;;  ? BRONCHIAL NEEDLE ASPIRATION BIOPSY  10/23/2019  ? Procedure: BRONCHIAL NEEDLE ASPIRATION BIOPSIES;  Surgeon: [name], MD;  Location[facility] ENDOSCOPY;  Service: Pulmonary;;  ? BRONCHIAL NEEDLE ASPIRATION BIOPSY  11/20/2019  ? Procedure: BRONCHIAL NEEDLE ASPIRATION BIOPSIES;  Surgeon: [name], MD;  Location: [facility] ENDOSCOPY;  Service: Pulmonary;;  ? BRONCHIAL WASHINGS  11/20/2019  ? Procedure: BRONCHIAL WASHINGS;  Surgeon: [name], MD;  Location: [facility] ENDOSCOPY;  Service: Pulmonary;;  ? CATARACT EXTRACTION Left   ? COLONOSCOPY  04-14-99  ? Dr Flossie Dibble polyp-TA in epic  ? POLYPECTOMY  04-14-99  ? POSTERIOR LAMINECTOMY / DECOMPRESSION CERVICAL SPINE    ? Dr Saintclair Halsted  ? [place]    ? left eye, 2007 x2, 2008 x 3  ? ROTATOR CUFF REPAIR  2004  ? right  ? TONSILLECTOMY  [id number]  ? VIDEO BRONCHOSCOPY WITH ENDOBRONCHIAL NAVIGATION N/A 10/23/2019  ? Procedure: VIDEO BRONCHOSCOPY WITH ENDOBRONCHIAL NAVIGATION;  Surgeon: [name], MD;  Location: [facility] ENDOSCOPY;  Service: Pulmonary;  Laterality: N/A;  ? VIDEO BRONCHOSCOPY WITH ENDOBRONCHIAL NAVIGATION N/A 11/20/2019  ? Procedure: VIDEO BRONCHOSCOPY WITH ENDOBRONCHIAL NAVIGATION;  Surgeon: [name], MD;  Location: [facility] ENDOSCOPY;  Service: Pulmonary;  Laterality: N/A;  ? VIDEO BRONCHOSCOPY WITH ENDOBRONCHIAL ULTRASOUND N/A 10/23/2019  ? Procedure: VIDEO BRONCHOSCOPY WITH ENDOBRONCHIAL ULTRASOUND;  Surgeon: [name], MD;  Location: [facility] ENDOSCOPY;  Service: Pulmonary;  Laterality: N/A;  ? ? ?REVIEW OF SYSTEMS:  A comprehensive review of systems was negative except for: Constitutional: positive for fatigue ?Respiratory: positive for dyspnea on exertion  ? ?PHYSICAL EXAMINATION: General appearance: alert, cooperative, and no distress ?Head: Normocephalic, without obvious abnormality, atraumatic ?Neck: no adenopathy, no JVD, supple, symmetrical, trachea midline, and  thyroid not enlarged, symmetric, no tenderness/mass/nodules ?Lymph nodes: Cervical, supraclavicular, and axillary nodes normal. ?Resp: clear to auscultation bilaterally ?Back: symmetric, no curvature. ROM norma

## 2021-07-22 ENCOUNTER — Encounter: Payer: Self-pay | Admitting: Cardiology

## 2021-07-22 ENCOUNTER — Ambulatory Visit (INDEPENDENT_AMBULATORY_CARE_PROVIDER_SITE_OTHER): Payer: Medicare Other | Admitting: Cardiology

## 2021-07-22 ENCOUNTER — Other Ambulatory Visit (INDEPENDENT_AMBULATORY_CARE_PROVIDER_SITE_OTHER): Payer: Medicare Other

## 2021-07-22 VITALS — BP 140/80 | HR 65 | Ht 71.0 in | Wt 194.0 lb

## 2021-07-22 DIAGNOSIS — N320 Bladder-neck obstruction: Secondary | ICD-10-CM | POA: Diagnosis not present

## 2021-07-22 DIAGNOSIS — I351 Nonrheumatic aortic (valve) insufficiency: Secondary | ICD-10-CM

## 2021-07-22 DIAGNOSIS — G4733 Obstructive sleep apnea (adult) (pediatric): Secondary | ICD-10-CM | POA: Diagnosis not present

## 2021-07-22 DIAGNOSIS — R0609 Other forms of dyspnea: Secondary | ICD-10-CM | POA: Diagnosis not present

## 2021-07-22 DIAGNOSIS — I480 Paroxysmal atrial fibrillation: Secondary | ICD-10-CM | POA: Diagnosis not present

## 2021-07-22 DIAGNOSIS — R001 Bradycardia, unspecified: Secondary | ICD-10-CM | POA: Diagnosis not present

## 2021-07-22 DIAGNOSIS — I2510 Atherosclerotic heart disease of native coronary artery without angina pectoris: Secondary | ICD-10-CM

## 2021-07-22 DIAGNOSIS — E039 Hypothyroidism, unspecified: Secondary | ICD-10-CM

## 2021-07-22 DIAGNOSIS — Z9989 Dependence on other enabling machines and devices: Secondary | ICD-10-CM | POA: Diagnosis not present

## 2021-07-22 DIAGNOSIS — I10 Essential (primary) hypertension: Secondary | ICD-10-CM

## 2021-07-22 LAB — COMPREHENSIVE METABOLIC PANEL
ALT: 9 U/L (ref 0–53)
AST: 14 U/L (ref 0–37)
Albumin: 4.3 g/dL (ref 3.5–5.2)
Alkaline Phosphatase: 87 U/L (ref 39–117)
BUN: 11 mg/dL (ref 6–23)
CO2: 31 mEq/L (ref 19–32)
Calcium: 9.8 mg/dL (ref 8.4–10.5)
Chloride: 101 mEq/L (ref 96–112)
Creatinine, Ser: 0.84 mg/dL (ref 0.40–1.50)
GFR: 85.39 mL/min (ref >60.00)
Glucose, Bld: 88 mg/dL (ref 70–99)
Potassium: 4.3 mEq/L (ref 3.5–5.1)
Sodium: 141 mEq/L (ref 135–145)
Total Bilirubin: 0.6 mg/dL (ref 0.2–1.2)
Total Protein: 7.3 g/dL (ref 6.0–8.3)

## 2021-07-22 LAB — T4, FREE: Free T4: 0.97 ng/dL (ref 0.60–1.60)

## 2021-07-22 LAB — TSH: TSH: 8.92 u[IU]/mL — ABNORMAL HIGH (ref 0.35–5.50)

## 2021-07-22 LAB — PSA: PSA: 0.06 ng/mL — ABNORMAL LOW (ref 0.10–4.00)

## 2021-07-22 MED ORDER — APIXABAN 5 MG PO TABS
ORAL_TABLET | ORAL | 11 refills | Status: DC
Start: 2021-07-22 — End: 2021-09-25

## 2021-07-22 NOTE — Progress Notes (Signed)
? ?Date:  07/22/2021  ? ?ID:  Joe [name], DOB 1945/11/03, MRN [id number] ? ? ?PCP:  [name], [name], MD  ?Cardiologist: Fransico Him, MD ?Electrophysiologist:  None  ? ?Chief Complaint:  HTN, PAF, OSA ? ?History of Present Illness:   ? ?Joe [name] is a 76 y.o. male  with a hx of HTN.  He was seen by me in 2015 when he had an EKG that the PCP though was afib but after review there was no findings of afib on EKG. He saw Dr. Alain Marion in March and was placed on Atenolol 50mg  PRN for palpitations.   ? ?He underwent home sleep study which showed mild OSA with an AHI of 9.2/hr with nocturnal hypoxemia with O2 sat 75% and mild snoring.  He underwent CPAP titration to 9cm H2O.   ? 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  He underwent XRT and tumor continues to shrink as much as 75%.  He has been txd with immunotherapy and chemo with Carboplatin and Paclitaxel.   ? ?He is here today for followup and is doing well.  He has chronic DOE due to COPD and uses O2 during the day.  Followed by Pulmonary. He denies any chest pain or pressure, PND, orthopnea, LE edema, dizziness (except for occasional vertigo), palpitations or syncope. He is compliant with His meds and is tolerating meds with no SE.    ? ?he is doing well with his CPAP device and thinks that he has gotten used to it.  He tolerates the mask and feels the pressure is adequate.  Since going on CPAP he feels rested in the am and has no significant daytime sleepi ?ness.  He denies any significant mouth or nasal dryness or nasal congestion.  He does not think that he snores.    ?  ?Prior CV studies:   ?The following studies were reviewed today: ? ? PAP compliance download from [place], stress test, echo ? ?2D echo 02/2020 ?IMPRESSIONS  ? 1. Left ventricular  ejection fraction, by estimation, is 60 to 65%. Left  ?ventricular ejection fraction by 3D volume is 59 %. The left ventricle has  ?normal function. The left ventricle has no regional wall motion  ?abnormalities. Left ventricular diastolic  ? parameters were normal. The average left ventricular global longitudinal  ?strain is -19.8 %. The global longitudinal strain is normal.  ? 2. Right ventricular systolic function is normal. The right ventricular  ?size is normal. Tricuspid regurgitation signal is inadequate for assessing  ?PA pressure.  ? 3. The mitral valve is grossly normal. No evidence of mitral valve  ?regurgitation. No evidence of mitral stenosis.  ? 4. The aortic valve is tricuspid. There is mild calcification of the  ?aortic valve. There is mild thickening of the aortic valve. Aortic valve  ?regurgitation is mild to moderate. No aortic stenosis is present. Aortic  ?regurgitation PHT measures 498 msec.  ? 5. The inferior vena cava is normal in size with greater than 50%  ?respiratory variability, suggesting right atrial pressure of 3 mmHg.  ? ?Past Medical History:  ?Diagnosis Date  ? Alcohol abuse, daily use 08/24/2010  ? Stopped 2015   ? Arthralgia 12/13/2013  ? 9/15 Not related to statins OA   ? Cataract   ? 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 chronic stage 1- no symtoms  ? CLL (chronic lymphocytic leukemia) ([place]) 08/08/2014  ? 06-15-2014 Dr Burr Medico Stage 0  ? COPD mixed type ([place]) 07/26/2007  ? Smoker - stopped 6/14   ? De Quervain's tenosynovitis, right 12-29-13  ? 06/14/2013   ? Depression   ? at times  ? Dysrhythmia   ? PAF  ? Dysuria 12/29/13  ? 9/15 - poss stricture Urol ref was offered   ? Gallstones 11/16/2017  ? Asymptomatic Pt refused surg ref  ? Generalized anxiety disorder 09/07/2012  ? Chronic   Potential benefits of a long term steroid  use as well as potential risks  and complications were explained to the patient and were aknowledged.     ? GERD 12/02/2006  ? Chronic     ? Grief 05/30/2016  ?  [name] died in Jun 15, 2014  ? Gynecomastia 12-29-13  ? Benign B 2013-06-14   ? Hyperlipidemia   ? Hypertension   ? Hypothyroidism 12/25/2014  ? 06/15/14 On Levothyroxine   ? Intertrigo 02/02/2012  ? 11/13   ? Neoplasm of uncertain behavior of skin 03/12/2013  ? 12/14 R ear, chest   ? OSA on CPAP   ? mild with AHI 9/hr and oxygen desats as low as 75%  ? Paresis ([place])   ? right- s/p cerv decompression  ? PERIORBITAL CELLULITIS 02/22/2009  ? Qualifier: Diagnosis of  By: Diona Browner MD, Amy    ? Retinal detachment   ? L>>R  ? ?Past Surgical History:  ?Procedure Laterality Date  ? BICEPS TENDON REPAIR    ? BRONCHIAL BIOPSY  10/23/2019  ? Procedure: BRONCHIAL BIOPSIES;  Surgeon: [name], MD;  Location: [facility] ENDOSCOPY;  Service: Pulmonary;;  ? BRONCHIAL BIOPSY  11/20/2019  ? Procedure: BRONCHIAL BIOPSIES;  Surgeon: [name], MD;  Location: [facility] ENDOSCOPY;  Service: Pulmonary;;  ? BRONCHIAL BRUSHINGS  10/23/2019  ? Procedure: BRONCHIAL BRUSHINGS;  Surgeon: [name], MD;  Location: [facility] ENDOSCOPY;  Service: Pulmonary;;  ? BRONCHIAL BRUSHINGS  11/20/2019  ? Procedure: BRONCHIAL BRUSHINGS;  Surgeon: [name], MD;  Location: [facility] ENDOSCOPY;  Service: Pulmonary;;  ? BRONCHIAL NEEDLE ASPIRATION BIOPSY  10/23/2019  ? Procedure: BRONCHIAL NEEDLE ASPIRATION BIOPSIES;  Surgeon: [name], MD;  Location: [facility] ENDOSCOPY;  Service: Pulmonary;;  ? BRONCHIAL NEEDLE ASPIRATION BIOPSY  11/20/2019  ? Procedure: BRONCHIAL NEEDLE ASPIRATION BIOPSIES;  Surgeon: [name], MD;  Location: [facility] ENDOSCOPY;  Service: Pulmonary;;  ? BRONCHIAL WASHINGS  11/20/2019  ? Procedure: BRONCHIAL WASHINGS;  Surgeon: [name], MD;  Location: [facility] ENDOSCOPY;  Service: Pulmonary;;  ? CATARACT EXTRACTION Left   ? COLONOSCOPY  04-14-99  ? Dr Flossie Dibble polyp-TA in epic  ? POLYPECTOMY  04-14-99  ? POSTERIOR LAMINECTOMY / DECOMPRESSION CERVICAL SPINE    ? Dr Saintclair Halsted  ? [place]    ? left eye, Jun 14, 2005 x2, 06-15-06 x 3  ? ROTATOR CUFF REPAIR  June 15, 2002  ? right  ?  TONSILLECTOMY  [id number]  ? VIDEO BRONCHOSCOPY WITH ENDOBRONCHIAL NAVIGATION N/A 10/23/2019  ? Procedure: VIDEO BRONCHOSCOPY WITH ENDOBRONCHIAL NAVIGATION;  Surgeon: [name], MD;  Location[facility] ENDOSCOPY;  Service: Pulmonary;  Laterality: N/A;  ? VIDEO BRONCHOSCOPY WITH ENDOBRONCHIAL NAVIGATION N/A 11/20/2019  ? Procedure: VIDEO BRONCHOSCOPY WITH ENDOBRONCHIAL NAVIGATION;  Surgeon: [name], MD;  Location: [facility] ENDOSCOPY;  Service: Pulmonary;  Laterality: N/A;  ? VIDEO BRONCHOSCOPY WITH ENDOBRONCHIAL ULTRASOUND N/A 10/23/2019  ? Procedure: VIDEO BRONCHOSCOPY WITH ENDOBRONCHIAL  ULTRASOUND;  Surgeon: [name], MD;  Location: [facility] ENDOSCOPY;  Service: Pulmonary;  Laterality: N/A;  ?  ? ?Current Meds  ?Medication Sig  ? albuterol (PROVENTIL) (2.5 MG/3ML) 0.083% nebulizer solution Take 3 mLs (2.5 mg total) by nebulization every 6 (six) hours as needed for wheezing or shortness of breath.  ? amLODipine (NORVASC) 5 MG tablet Take 1 tablet (5 mg total) by mouth every evening. Please hold this medication for now, please check your blood pressure daily in the morning, bring in record for your pcp to review. Your pcp and you can decide on resumption or discontinuation of this medication  ? apixaban (ELIQUIS) 5 MG TABS tablet TAKE 1 TABLET(5 MG) BY MOUTH TWICE DAILY  ? atenolol (TENORMIN) 25 MG tablet TAKE 1 TABLET BY MOUTH DAILY AS NEEDED FOR PALPITATIONS (Patient taking differently: Take 25 mg by mouth daily as needed (palpitations).)  ? Cholecalciferol 25 MCG (1000 UT) tablet Take 1,000 Units by mouth daily.  ? clotrimazole-betamethasone (LOTRISONE) cream Apply topically 2 (two) times daily.  ? diazepam (VALIUM) 5 MG tablet TAKE 1 TABLET(5 MG) BY MOUTH EVERY 12 HOURS (Patient taking differently: Take 5 mg by mouth every 12 (twelve) hours as needed for anxiety.)  ? famotidine (PEPCID) 20 MG tablet Take 1 tablet (20 mg total) by mouth at bedtime.  ? guaiFENesin (MUCINEX) 600 MG 12 hr tablet Take 1 tablet (600 mg  total) by mouth 2 (two) times daily.  ? levothyroxine (SYNTHROID) 112 MCG tablet TAKE 1 TABLET(112 MCG) BY MOUTH DAILY  ? lovastatin (MEVACOR) 20 MG tablet TAKE 1 TABLET BY MOUTH EVERY NIGHT AT BEDTIME (Patient t

## 2021-07-22 NOTE — Patient Instructions (Signed)
Medication Instructions:  ?Your physician recommends that you continue on your current medications as directed. Please refer to the Current Medication list given to you today. ? ?*If you need a refill on your cardiac medications before your next appointment, please call your pharmacy* ? ?Testing/Procedures: ?Your physician has requested that you have an echocardiogram in December 2023. Echocardiography is a painless test that uses sound waves to create images of your heart. It provides your doctor with information about the size and shape of your heart and how well your heart?s chambers and valves are working. This procedure takes approximately one hour. There are no restrictions for this procedure. ? ?Follow-Up: ?At [facility], you and your health needs are our priority.  As part of our continuing mission to provide you with exceptional heart care, we have created designated Provider Care Teams.  These Care Teams include your primary Cardiologist (physician) and Advanced Practice Providers (APPs -  Physician Assistants and Nurse Practitioners) who all work together to provide you with the care you need, when you need it. ? ?Your next appointment:   ?1 year(s) ? ?The format for your next appointment:   ?In Person ? ?Provider:   ?Fransico Him, MD ? ? ?Important Information About Sugar ? ? ? ? ?  ?

## 2021-07-29 ENCOUNTER — Encounter: Payer: Self-pay | Admitting: Internal Medicine

## 2021-07-29 ENCOUNTER — Ambulatory Visit (INDEPENDENT_AMBULATORY_CARE_PROVIDER_SITE_OTHER): Payer: Medicare Other | Admitting: Internal Medicine

## 2021-07-29 DIAGNOSIS — I10 Essential (primary) hypertension: Secondary | ICD-10-CM | POA: Diagnosis not present

## 2021-07-29 DIAGNOSIS — E039 Hypothyroidism, unspecified: Secondary | ICD-10-CM

## 2021-07-29 DIAGNOSIS — M25562 Pain in left knee: Secondary | ICD-10-CM | POA: Diagnosis not present

## 2021-07-29 DIAGNOSIS — I2510 Atherosclerotic heart disease of native coronary artery without angina pectoris: Secondary | ICD-10-CM

## 2021-07-29 DIAGNOSIS — J449 Chronic obstructive pulmonary disease, unspecified: Secondary | ICD-10-CM

## 2021-07-29 DIAGNOSIS — M25561 Pain in right knee: Secondary | ICD-10-CM | POA: Diagnosis not present

## 2021-07-29 DIAGNOSIS — G8929 Other chronic pain: Secondary | ICD-10-CM

## 2021-07-29 MED ORDER — DIAZEPAM 5 MG PO TABS: 5.0000 mg | ORAL_TABLET | Freq: Two times a day (BID) | ORAL | 1 refills | Status: DC | PRN

## 2021-07-29 MED ORDER — ATENOLOL 25 MG PO TABS: 25.0000 mg | ORAL_TABLET | Freq: Every day | ORAL | 0 refills | Status: DC | PRN

## 2021-07-29 NOTE — Patient Instructions (Signed)
? ? ? ?  Blue-Emu cream -- use 2-3 times a day on your knees ? ?

## 2021-07-29 NOTE — Progress Notes (Signed)
? ?Subjective:  ?Patient ID: Joe [name], male    DOB: [birth year]  Age: 76 y.o. MRN: [id number] ? ?CC: Follow-up (Knee and ankles have been bothering him and he would like to discuss) ? ? ?HPI ?Joe [name] presents for lung cancer, knee and ankles have been bothering him. F/u on HTN, COPD, hypothyroidism ? ?Outpatient Medications Prior to Visit  ?Medication Sig Dispense Refill  ? albuterol (PROVENTIL) (2.5 MG/3ML) 0.083% nebulizer solution Take 3 mLs (2.5 mg total) by nebulization every 6 (six) hours as needed for wheezing or shortness of breath. 75 mL 12  ? amLODipine (NORVASC) 5 MG tablet Take 1 tablet (5 mg total) by mouth every evening. Please hold this medication for now, please check your blood pressure daily in the morning, bring in record for your pcp to review. Your pcp and you can decide on resumption or discontinuation of this medication    ? apixaban (ELIQUIS) 5 MG TABS tablet TAKE 1 TABLET(5 MG) BY MOUTH TWICE DAILY 60 tablet 11  ? Cholecalciferol 25 MCG (1000 UT) tablet Take 1,000 Units by mouth daily.    ? clotrimazole-betamethasone (LOTRISONE) cream Apply topically 2 (two) times daily. 45 g 2  ? Fluticasone-Umeclidin-Vilant (TRELEGY ELLIPTA) 100-62.5-25 MCG/ACT AEPB Inhale 1 puff into the lungs daily. 1 each 11  ? guaiFENesin (MUCINEX) 600 MG 12 hr tablet Take 1 tablet (600 mg total) by mouth 2 (two) times daily.    ? levothyroxine (SYNTHROID) 112 MCG tablet TAKE 1 TABLET(112 MCG) BY MOUTH DAILY 30 tablet 2  ? lovastatin (MEVACOR) 20 MG tablet TAKE 1 TABLET BY MOUTH EVERY NIGHT AT BEDTIME (Patient taking differently: Take 20 mg by mouth at bedtime.) 90 tablet 2  ? Multiple Vitamin (MULTIVITAMIN) tablet Take 1 tablet by mouth daily. Centrum Silver.    ? Polyethyl Glycol-Propyl Glycol (SYSTANE) 0.4-0.3 % SOLN Place 1 drop into both eyes daily as needed (Dry eyes). Ultra    ? atenolol (TENORMIN) 25 MG tablet TAKE 1 TABLET BY MOUTH DAILY AS NEEDED FOR PALPITATIONS (Patient taking differently:  Take 25 mg by mouth daily as needed (palpitations).) 90 tablet 3  ? diazepam (VALIUM) 5 MG tablet TAKE 1 TABLET(5 MG) BY MOUTH EVERY 12 HOURS (Patient taking differently: Take 5 mg by mouth every 12 (twelve) hours as needed for anxiety.) 180 tablet 1  ? famotidine (PEPCID) 20 MG tablet Take 1 tablet (20 mg total) by mouth at bedtime. 30 tablet 1  ? ?No facility-administered medications prior to visit.  ? ? ?ROS: ?Review of Systems  ?Constitutional:  Positive for fatigue. Negative for appetite change and unexpected weight change.  ?HENT:  Negative for congestion, nosebleeds, sneezing, sore throat and trouble swallowing.   ?Eyes:  Negative for itching and visual disturbance.  ?Respiratory:  Positive for shortness of breath. Negative for cough.   ?Cardiovascular:  Negative for chest pain, palpitations and leg swelling.  ?Gastrointestinal:  Negative for abdominal distention, blood in stool, diarrhea and nausea.  ?Genitourinary:  Negative for frequency and hematuria.  ?Musculoskeletal:  Positive for arthralgias, back pain and gait problem. Negative for joint swelling and neck pain.  ?Skin:  Negative for rash.  ?Neurological:  Negative for dizziness, tremors, speech difficulty and weakness.  ?Psychiatric/Behavioral:  Negative for agitation, dysphoric mood, sleep disturbance and suicidal ideas. The patient is not nervous/anxious.   ? ?Objective:  ?BP 138/72 (BP Location: Left Arm, Patient Position: Sitting, Cuff Size: Large)   Pulse 61   Temp 97.8 ?F (36.6 ?C) (Temporal)   Ht  5\' 11"  (1.803 m)   Wt 194 lb (88 kg)   SpO2 95%   BMI 27.06 kg/m?  ? ?BP Readings from Last 3 Encounters:  ?07/29/21 138/72  ?07/22/21 140/80  ?07/02/21 138/71  ? ? ?Wt Readings from Last 3 Encounters:  ?07/29/21 194 lb (88 kg)  ?07/22/21 194 lb (88 kg)  ?07/02/21 191 lb 14.4 oz (87 kg)  ? ? ?Physical Exam ?Constitutional:   ?   General: He is not in acute distress. ?   Appearance: He is well-developed. He is obese.  ?   Comments: NAD  ?Eyes:  ?    Conjunctiva/sclera: Conjunctivae normal.  ?   Pupils: Pupils are equal, round, and reactive to light.  ?Neck:  ?   Thyroid: No thyromegaly.  ?   Vascular: No JVD.  ?Cardiovascular:  ?   Rate and Rhythm: Normal rate and regular rhythm.  ?   Heart sounds: Normal heart sounds. No murmur heard. ?  No friction rub. No gallop.  ?Pulmonary:  ?   Effort: Pulmonary effort is normal. No respiratory distress.  ?   Breath sounds: Normal breath sounds. No wheezing or rales.  ?Chest:  ?   Chest wall: No tenderness.  ?Abdominal:  ?   General: Bowel sounds are normal. There is no distension.  ?   Palpations: Abdomen is soft. There is no mass.  ?   Tenderness: There is no abdominal tenderness. There is no guarding or rebound.  ?Musculoskeletal:     ?   General: No tenderness. Normal range of motion.  ?   Cervical back: Normal range of motion.  ?Lymphadenopathy:  ?   Cervical: No cervical adenopathy.  ?Skin: ?   General: Skin is warm and dry.  ?   Findings: No rash.  ?Neurological:  ?   Mental Status: He is alert and oriented to person, place, and time.  ?   Cranial Nerves: No cranial nerve deficit.  ?   Motor: No abnormal muscle tone.  ?   Coordination: Coordination normal.  ?   Gait: Gait normal.  ?   Deep Tendon Reflexes: Reflexes are normal and symmetric.  ?Psychiatric:     ?   Behavior: Behavior normal.     ?   Thought Content: Thought content normal.     ?   Judgment: Judgment normal.  ?On O2 ? ?B knees w/pain ?Dry skin - lower legs ? ? ?Lab Results  ?Component Value Date  ? WBC 13.1 (H) 06/30/2021  ? HGB 14.5 06/30/2021  ? HCT 45.0 06/30/2021  ? PLT 300 06/30/2021  ? GLUCOSE 88 07/22/2021  ? CHOL 153 11/23/2018  ? TRIG 201.0 (H) 11/23/2018  ? HDL 38.20 (L) 11/23/2018  ? LDLDIRECT 99.0 11/23/2018  ? [place] 80 11/07/2017  ? ALT 9 07/22/2021  ? AST 14 07/22/2021  ? NA 141 07/22/2021  ? K 4.3 07/22/2021  ? CL 101 07/22/2021  ? CREATININE 0.84 07/22/2021  ? BUN 11 07/22/2021  ? CO2 31 07/22/2021  ? TSH 8.92 (H) 07/22/2021  ? PSA  0.06 (L) 07/22/2021  ? INR 1.4 (H) 10/15/2019  ? ? ?CT Chest W Contrast ? ?Result Date: 07/01/2021 ?CLINICAL DATA:  Primary Cancer Type: Lung * Tracking Code: BO * Imaging Indication: Routine surveillance Interval therapy since last imaging? No Initial Cancer Diagnosis Date: 11/20/19; Established by: Biopsy-proven Detailed Pathology: Stage IIIb non-small cell lung cancer, squamous cell carcinoma. Primary Tumor location: Left upper lobe. Surgeries: No thoracic. Chemotherapy: Yes; Ongoing? No;  Most recent administration: 01/14/2020 Immunotherapy?  Yes; Type: Imfinzi; Ongoing? No Radiation therapy? Yes; Date Range: 12/10/2019 - 01/21/2020; Target: Left lung EXAM: CT CHEST WITH CONTRAST TECHNIQUE: Multidetector CT imaging of the chest was performed during intravenous contrast administration. RADIATION DOSE REDUCTION: This exam was performed according to the departmental dose-optimization program which includes automated exposure control, adjustment of the mA and/or kV according to patient size and/or use of iterative reconstruction technique. CONTRAST:  31mL OMNIPAQUE IOHEXOL 300 MG/ML  SOLN COMPARISON:  Most recent CT chest 03/31/2021. FINDINGS: Cardiovascular: The heart size is normal. No substantial pericardial effusion. Coronary artery calcification is evident. Ascending thoracic aorta measures 4.1 cm diameter. Mild atherosclerotic calcification is noted in the wall of the thoracic aorta. Mediastinum/Nodes: No mediastinal lymphadenopathy. There is no hilar lymphadenopathy. The esophagus has normal imaging features. There is no axillary lymphadenopathy. Lungs/Pleura: Centrilobular and paraseptal emphysema evident. Stable right-sided lung nodules including 7-8 mm peripheral right upper lobe nodule on 30/7 and 5 mm perifissural nodule anteriorly in the right lung on 67/7. Volume loss left hemithorax again noted with stable post treatment scarring in the left upper lobe in superior segment left lower lobe. 7 mm left  lower lobe nodule on 102/7 is new in the interval. No pleural effusion. Upper Abdomen: Calcified gallstones again noted. Musculoskeletal: No worrisome lytic or sclerotic osseous abnormality. IMPRESSION: 1. Sta

## 2021-07-29 NOTE — Assessment & Plan Note (Signed)
B OA: Blue-Emu cream -- use 2-3 times a day on your knees ?

## 2021-07-29 NOTE — Assessment & Plan Note (Signed)
Abn TSH. FT3 and FT4 are normal. On Levothroid ?

## 2021-07-29 NOTE — Assessment & Plan Note (Addendum)
Cont w/O2 ?Off steroids ?

## 2021-07-29 NOTE — Assessment & Plan Note (Signed)
Chronic  ?Cont on Norvasc, Atenolol ?

## 2021-08-03 ENCOUNTER — Other Ambulatory Visit: Payer: Self-pay | Admitting: Internal Medicine

## 2021-08-23 ENCOUNTER — Other Ambulatory Visit: Payer: Self-pay | Admitting: Physician Assistant

## 2021-08-23 DIAGNOSIS — E039 Hypothyroidism, unspecified: Secondary | ICD-10-CM

## 2021-09-09 ENCOUNTER — Telehealth: Payer: Self-pay | Admitting: Internal Medicine

## 2021-09-09 NOTE — Telephone Encounter (Signed)
Left message for patient to call back to schedule Medicare Annual Wellness Visit   Last AWV  01/04/19  Please schedule at anytime with [facility] if patient calls the office back.    Any questions, please call me at [phone]

## 2021-09-11 ENCOUNTER — Other Ambulatory Visit: Payer: Self-pay | Admitting: Physician Assistant

## 2021-09-23 ENCOUNTER — Ambulatory Visit (INDEPENDENT_AMBULATORY_CARE_PROVIDER_SITE_OTHER): Payer: Medicare Other

## 2021-09-23 VITALS — BP 122/66 | HR 88 | Temp 98.3°F | Resp 18 | Ht 71.0 in | Wt 195.4 lb

## 2021-09-23 DIAGNOSIS — Z0000 Encounter for general adult medical examination without abnormal findings: Secondary | ICD-10-CM | POA: Diagnosis not present

## 2021-09-23 NOTE — Progress Notes (Addendum)
Subjective:   Joe [name] is a 76 y.o. male who presents for Medicare Annual/Subsequent preventive examination.  Review of Systems           Objective:    Today's Vitals   09/23/21 1351  Height: 5\' 11"  (1.803 m)   Body mass index is 27.06 kg/m.     02/09/2021    3:47 PM 01/19/2021    9:51 AM 12/22/2020   10:17 AM 12/22/2020    9:32 AM 11/25/2020    9:37 AM 10/27/2020    9:59 AM 10/27/2020    9:36 AM  Advanced Directives  Does Patient Have a Medical Advance Directive? Yes Yes Yes Yes Yes Yes Yes  Type of Paramedic of [place];Living will [place];Living will [place];Living will [place];Living will Healthcare Power of [place]  Does patient want to make changes to medical advance directive? No - Patient declined    No - Patient declined  No - Patient declined  Copy of [place] in Chart? No - copy requested No - copy requested No - copy requested No - copy requested No - copy requested No - copy requested No - copy requested  Would patient like information on creating a medical advance directive? No - Patient declined No - Patient declined No - Patient declined   No - Patient declined No - Patient declined    Current Medications (verified) Outpatient Encounter Medications as of 09/23/2021  Medication Sig   albuterol (PROVENTIL) (2.5 MG/3ML) 0.083% nebulizer solution Take 3 mLs (2.5 mg total) by nebulization every 6 (six) hours as needed for wheezing or shortness of breath.   amLODipine (NORVASC) 5 MG tablet TAKE 1 TABLET BY MOUTH EVERY DAY   apixaban (ELIQUIS) 5 MG TABS tablet TAKE 1 TABLET(5 MG) BY MOUTH TWICE DAILY   atenolol (TENORMIN) 25 MG tablet Take 1 tablet (25 mg total) by mouth daily as needed (palpitations).   Cholecalciferol 25 MCG (1000 UT) tablet Take 1,000 Units by mouth daily.    clotrimazole-betamethasone (LOTRISONE) cream Apply topically 2 (two) times daily.   diazepam (VALIUM) 5 MG tablet Take 1 tablet (5 mg total) by mouth every 12 (twelve) hours as needed for anxiety.   famotidine (PEPCID) 20 MG tablet Take 1 tablet (20 mg total) by mouth at bedtime.   Fluticasone-Umeclidin-Vilant (TRELEGY ELLIPTA) 100-62.5-25 MCG/ACT AEPB Inhale 1 puff into the lungs daily.   guaiFENesin (MUCINEX) 600 MG 12 hr tablet Take 1 tablet (600 mg total) by mouth 2 (two) times daily.   levothyroxine (SYNTHROID) 112 MCG tablet TAKE 1 TABLET(112 MCG) BY MOUTH DAILY   lovastatin (MEVACOR) 20 MG tablet TAKE 1 TABLET BY MOUTH EVERY NIGHT AT BEDTIME   Multiple Vitamin (MULTIVITAMIN) tablet Take 1 tablet by mouth daily. Centrum Silver.   Polyethyl Glycol-Propyl Glycol (SYSTANE) 0.4-0.3 % SOLN Place 1 drop into both eyes daily as needed (Dry eyes). Ultra   No facility-administered encounter medications on file as of 09/23/2021.    Allergies (verified) Patient has no known allergies.   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   Past Surgical History:  Procedure Laterality Date   BICEPS TENDON REPAIR     BRONCHIAL BIOPSY  10/23/2019   Procedure: BRONCHIAL BIOPSIES;  Surgeon: [name], MD;  Location: [facility] ENDOSCOPY;  Service: Pulmonary;;   BRONCHIAL BIOPSY  11/20/2019   Procedure: BRONCHIAL BIOPSIES;  Surgeon: [name], MD;  Location[facility]OSCOPY;  Service: Pulmonary;;   BRONCHIAL BRUSHINGS  10/23/2019   Procedure: BRONCHIAL BRUSHINGS;  Surgeon: [name], MD;  Location: [facility]COPY;  Service: Pulmonary;;   BRONCHIAL BRUSHINGS  11/20/2019   Procedure: BRONCHIAL BRUSHINGS;  Surgeon: [name], MD;  Location: [facility] ENDOSCOPY;  Service: Pulmonary;;   BRONCHIAL NEEDLE ASPIRATION BIOPSY  10/23/2019   Procedure: BRONCHIAL NEEDLE ASPIRATION BIOPSIES;  Surgeon: [name], MD;  Location: [facility] ENDOSCOPY;  Service: Pulmonary;;   BRONCHIAL NEEDLE ASPIRATION BIOPSY  11/20/2019   Procedure: BRONCHIAL NEEDLE ASPIRATION BIOPSIES;  Surgeon: [name], MD;  Location: [facility] ENDOSCOPY;  Service: Pulmonary;;   BRONCHIAL WASHINGS  11/20/2019   Procedure: BRONCHIAL WASHINGS;  Surgeon: [name], MD;  Location: [place];  Service: Pulmonary;;   CATARACT EXTRACTION Left    COLONOSCOPY  04-14-99   Dr Flossie Dibble polyp-TA in epic   POLYPECTOMY  04-14-99   POSTERIOR LAMINECTOMY / DECOMPRESSION CERVICAL SPINE     Dr Saintclair Halsted   RETINAL DETACHMENT SURGERY     left eye, 06/04/05 x2, 06/05/2006 x 3   ROTATOR CUFF REPAIR  June 05, 2002   right   TONSILLECTOMY  [id number]   VIDEO BRONCHOSCOPY WITH  ENDOBRONCHIAL NAVIGATION N/A 10/23/2019   Procedure: [place];  Surgeon: [name], MD;  Location: [facility] ENDOSCOPY;  Service: Pulmonary;  Laterality: N/A;   VIDEO BRONCHOSCOPY WITH ENDOBRONCHIAL NAVIGATION N/A 11/20/2019   Procedure: VIDEO BRONCHOSCOPY WITH ENDOBRONCHIAL NAVIGATION;  Surgeon: [name], MD;  Location: [place] ENDOSCOPY;  Service: Pulmonary;  Laterality: N/A;   VIDEO BRONCHOSCOPY WITH ENDOBRONCHIAL ULTRASOUND N/A 10/23/2019   Procedure: VIDEO BRONCHOSCOPY WITH ENDOBRONCHIAL ULTRASOUND;  Surgeon: [name], MD;  Location: [place] ENDOSCOPY;  Service: Pulmonary;  Laterality: N/A;   Family History  Problem Relation Age of Onset   COPD Mother    Diabetes Father    Coronary artery disease Other    Breast cancer Paternal Aunt 54   Colon cancer Neg Hx    Social History   Socioeconomic History   Marital status: Widowed    Spouse name: Not on file   Number of children: 1   Years of education: Not on file   Highest education level: Not on file  Occupational History   Occupation: retired  Tobacco Use   Smoking status: Former    Packs/day: 0.80    Years: 50.00    Total pack years: 40.00    Types: Cigarettes    Quit date: 08/27/2012    Years since quitting: 9.0   Smokeless tobacco: Never  Vaping Use   Vaping Use: Never used  Substance and Sexual Activity   Alcohol use: Not Currently   Drug use: No   Sexual activity: Not Currently  Other Topics Concern   Not on file  Social History Narrative   Not on file   Social Determinants of Health   Financial Resource Strain: Low Risk  (11/16/2017)   Overall Financial Resource Strain (CARDIA)    Difficulty of Paying Living Expenses: Not hard at all  Food Insecurity: No Food Insecurity (11/16/2017)   Hunger Vital Sign    Worried About Running Out of Food in the Last Year: Never true    [place] in the Last Year: Never true  Transportation Needs: No Transportation Needs (11/16/2017)    PRAPARE - Hydrologist (Medical): No    Lack of Transportation (Non-Medical): No  Physical Activity: Inactive (01/04/2019)   Exercise Vital Sign    Days of Exercise per Week: 0 days    Minutes of Exercise per Session: 0 min  Stress: No Stress Concern Present (11/16/2017)   [place]    Feeling of Stress : Not at all  Social Connections: Unknown (01/04/2019)   Social Connection and Isolation Panel [NHANES]    Frequency of Communication with Friends and Family: Not on file    Frequency of Social Gatherings with Friends and Family: Not on file    Attends Religious Services: Not on file    Active Member of Clubs or Organizations: Yes    Attends Archivist Meetings: Not on file    Marital Status: Not on file    Tobacco Counseling Counseling given: Not Answered   Clinical Intake:                 Diabetic?No         Activities of Daily Living    02/09/2021    3:53 PM 02/09/2021    3:49 PM  In your present state of health, do you have any difficulty performing the following activities:  Hearing?  0  Vision?  1  Comment  retinal detachement in left eye with several surgeries, hx cataract surgery in right eye, has dry eyes  Difficulty concentrating or making decisions?  0  Walking or climbing stairs?  1  Dressing or bathing?  0  Doing errands, shopping? 1   Comment change from your normal     Patient Care Team: [name], [name], MD as PCP - General Truitt Merle, MD as Consulting Physician (Hematology) Sueanne Margarita, MD as Consulting Physician (Cardiology) Hayden Pedro, MD as Consulting Physician (Ophthalmology) Valrie Hart, RN as Oncology  Nurse Navigator Maryjane Hurter, MD as Consulting Physician (Pulmonary Disease)  Indicate any recent Medical Services you may have received from other than [facility] providers in the past year (date may be  approximate).     Assessment:   This is a routine wellness examination for Joe [name].  Hearing/Vision screen No results found.  Dietary issues and exercise activities discussed:     Goals Addressed   None   Depression Screen    04/29/2021    9:30 AM 08/25/2020    8:54 AM 02/25/2020   10:31 AM 01/04/2019    9:06 AM 11/16/2017    9:02 AM 11/11/2016    9:42 AM 06/25/2014    8:16 AM  PHQ 2/9 Scores  PHQ - 2 Score 0 0 0 2 1 0 0  PHQ- 9 Score    4       Fall Risk    04/29/2021    9:30 AM 08/25/2020    8:54 AM 01/04/2019    9:06 AM 11/16/2017    9:02 AM 11/11/2016    9:42 AM  Fall Risk   Falls in the past year? 0 0 0 No No  Number falls in past yr: 0 0 0    Injury with Fall? 0 0 0      FALL RISK PREVENTION PERTAINING TO THE HOME:  Any stairs in or around the home? Yes  If so, are there any without handrails? No  Home free of loose throw rugs in walkways, pet beds, electrical cords, etc? Yes  Adequate lighting in your home to reduce risk of falls? Yes   ASSISTIVE DEVICES UTILIZED TO PREVENT FALLS:  Life alert? No  Use of a cane, walker or w/c? Yes  Grab bars in the bathroom? No  Shower chair or bench in shower? Yes  Elevated toilet seat or a handicapped toilet? No   TIMED UP AND GO:  Was the test performed? No .  Length of time to ambulate 10 feet:  sec.   Gait steady and fast with assistive device  Cognitive Function:        Immunizations Immunization History  Administered Date(s) Administered   Fluad Quad(high Dose 65+) 11/23/2018, 12/24/2019, 12/24/2020   Influenza Split 02/02/2012   Influenza Whole 12/23/2005   Influenza, High Dose Seasonal PF 12/11/2015, 12/23/2016, 12/29/2017   Influenza,inj,Quad PF,6+ Mos 12/13/2012, 12/13/2013, 12/25/2014   PFIZER(Purple Top)SARS-COV-2 Vaccination 05/20/2019, 06/13/2019, 01/09/2020   Pfizer Covid-19 Vaccine Bivalent Booster 80yrs & up 01/13/2021   Pneumococcal Conjugate-13 03/12/2013   Pneumococcal Polysaccharide-23  06/12/2013   Td 05/14/2009   Tdap 11/27/2019    TDAP status: Up to date  Flu Vaccine status: Up to date  Pneumococcal vaccine status: Up to date  Covid-19 vaccine status: Completed vaccines  Qualifies for Shingles Vaccine? Yes   Zostavax completed No   Shingrix Completed?: No.    Education has been provided regarding the importance of this vaccine. Patient has been advised to call insurance company to determine out of pocket expense if they have not yet received this vaccine. Advised may also receive vaccine at local pharmacy or Health Dept. Verbalized acceptance and understanding.  Screening Tests Health Maintenance  Topic Date Due   Zoster Vaccines- Shingrix (1 of 2) Never done   COLONOSCOPY (Pts 45-61yrs Insurance coverage will need to be confirmed)  09/12/2019   INFLUENZA VACCINE  10/20/2021   TETANUS/TDAP  11/26/2029   Pneumonia Vaccine 57+ Years old  Completed   COVID-19 Vaccine  Completed   Hepatitis  C Screening  Completed   HPV VACCINES  Aged Out    Health Maintenance  Health Maintenance Due  Topic Date Due   Zoster Vaccines- Shingrix (1 of 2) Never done   COLONOSCOPY (Pts 45-85yrs Insurance coverage will need to be confirmed)  09/12/2019    Colorectal cancer screening: No longer required.   Lung Cancer Screening: (Low Dose CT Chest recommended if Age 22-80 years, 30 pack-year currently smoking OR have quit w/in 15years.) does qualify.   Lung Cancer Screening Referral: Pt sees a oncologist for his stage 3 lung cancer  Additional Screening:  Hepatitis C Screening: does qualify; Completed 07/18/2014  Vision Screening: Recommended annual ophthalmology exams for early detection of glaucoma and other disorders of the eye. Is the patient up to date with their annual eye exam?  Yes  Who is the provider or what is the name of the office in which the patient attends annual eye exams?  If pt is not established with a provider, would they like to be referred to a  provider to establish care? Yes .   Dental Screening: Recommended annual dental exams for proper oral hygiene  Community Resource Referral / Chronic Care Management: CRR required this visit?  No   CCM required this visit?  No      Plan:     I have personally reviewed and noted the following in the patient's chart:   Medical and social history Use of alcohol, tobacco or illicit drugs  Current medications and supplements including opioid prescriptions. Patient is currently taking opioid prescriptions. Information provided to patient regarding non-opioid alternatives. Patient advised to discuss non-opioid treatment plan with their provider. Functional ability and status Nutritional status Physical activity Advanced directives List of other physicians Hospitalizations, surgeries, and ER visits in previous 12 months Vitals Screenings to include cognitive, depression, and falls Referrals and appointments  In addition, I have reviewed and discussed with patient certain preventive protocols, quality metrics, and best practice recommendations. A written personalized care plan for preventive services as well as general preventive health recommendations were provided to patient.     Thomes Cake, [place]   09/23/2021   Nurse Notes: No concerns at this time. He was advised to call our office if anything changes with his health.   Medical screening examination/treatment/procedure(s) were performed by non-physician practitioner and as supervising physician I was immediately available for consultation/collaboration.  I agree with above. Lew Dawes, MD

## 2021-09-23 NOTE — Patient Instructions (Signed)
It was great seeing you today   Please keep appointments as schedule   Stay cool and hydrated in this heat.

## 2021-09-25 ENCOUNTER — Other Ambulatory Visit: Payer: Self-pay | Admitting: Cardiology

## 2021-09-25 NOTE — Telephone Encounter (Signed)
Prescription refill request for Eliquis received. Indication: Atrial Fib Last office visit: 07/22/21  T turner MD Scr: 0.84 on 07/22/21 Age: 76 Weight: 88kg  Based on above findings Eliquis 5mg  twice dail is the appropriate dose.  Refill approved.

## 2021-09-30 IMAGING — CT CT CHEST W/ CM
2 of 4 series · 14 of 36 positions shown, 17 images · IV contrast (OMNIPAQUE)
Comparison: Most recent CT chest 05/12/2020.  11/09/2019 PET-CT.

CLINICAL DATA: Primary Cancer Type: Lung
TECHNIQUE: Multidetector CT imaging of the chest was performed during
intravenous contrast administration.

CONTRAST:  75mL OMNIPAQUE IOHEXOL 300 MG/ML  SOLN

[Series 2: axial st · axial · 0.82mm/px · z∈[-245,+25]mm · 11 of 161 slices shown, 14 images]
[im 13/161  mediastinal]
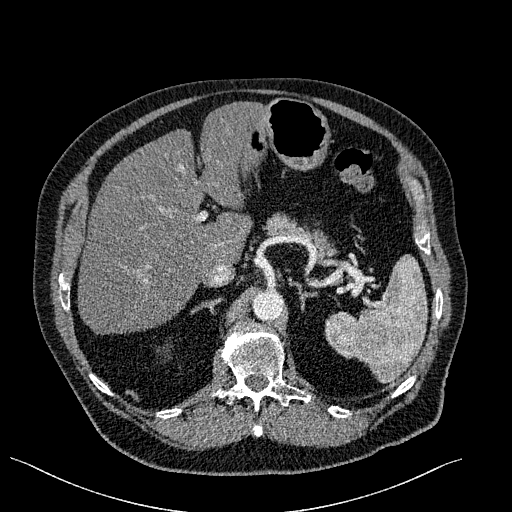
[im 13/161  lung]
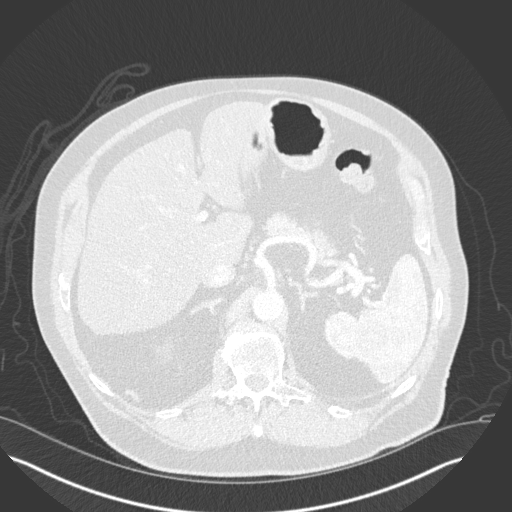
[im 25/161  lung]
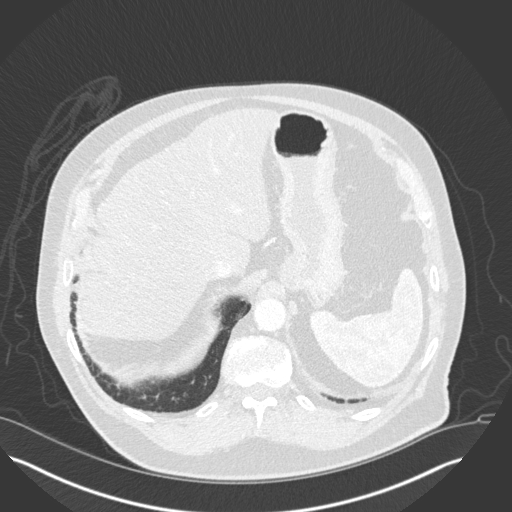
[im 37/161  lung]
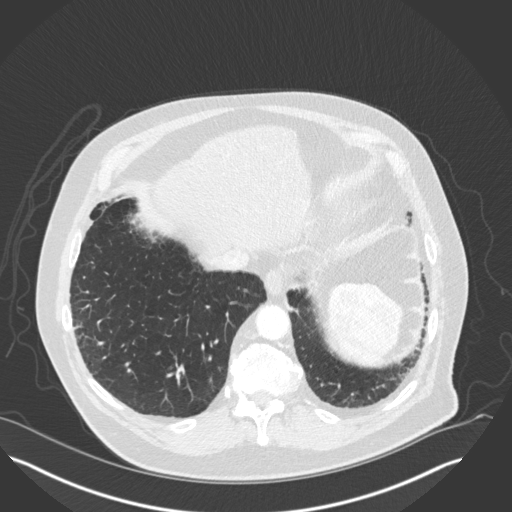
[im 50/161  lung]
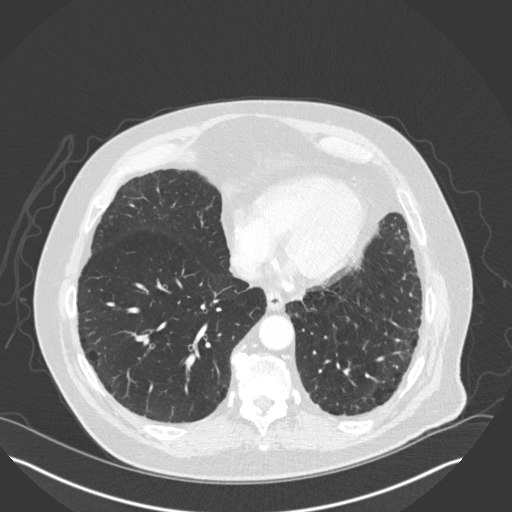
[im 62/161  mediastinal]
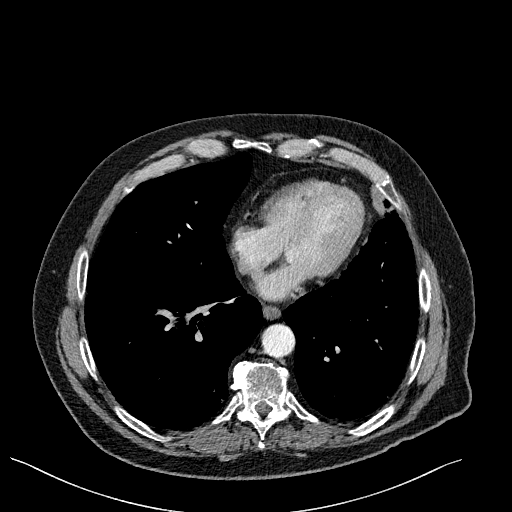
[im 62/161  lung]
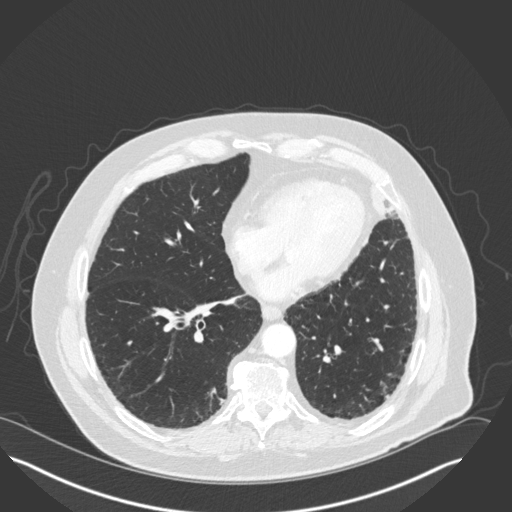
[im 87/161  lung]
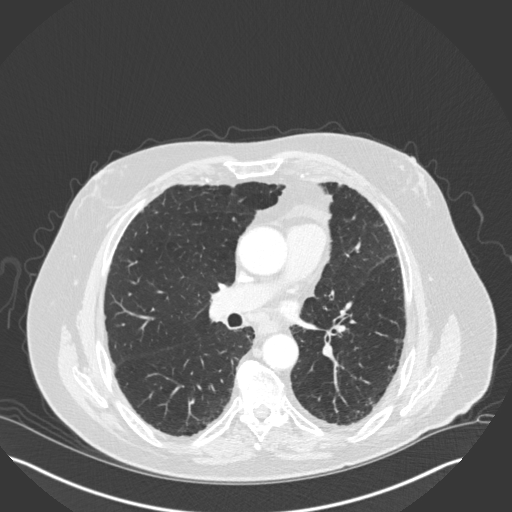
[im 99/161  lung]
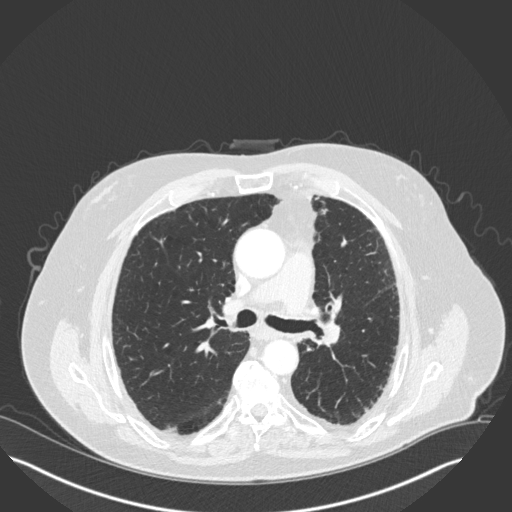
[im 111/161  lung]
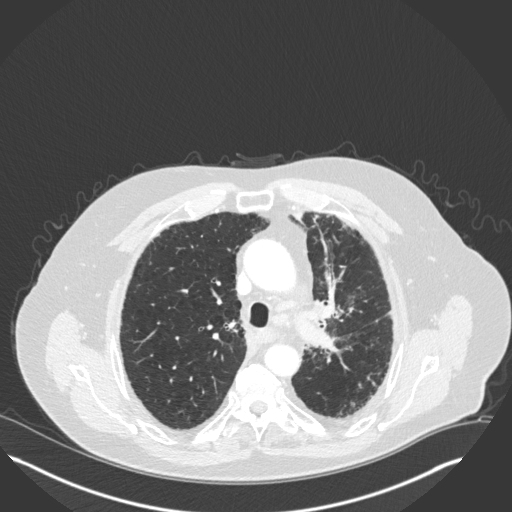
[im 124/161  mediastinal]
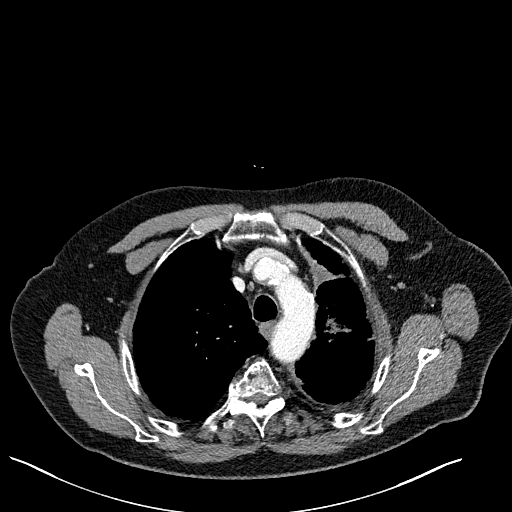
[im 124/161  lung]
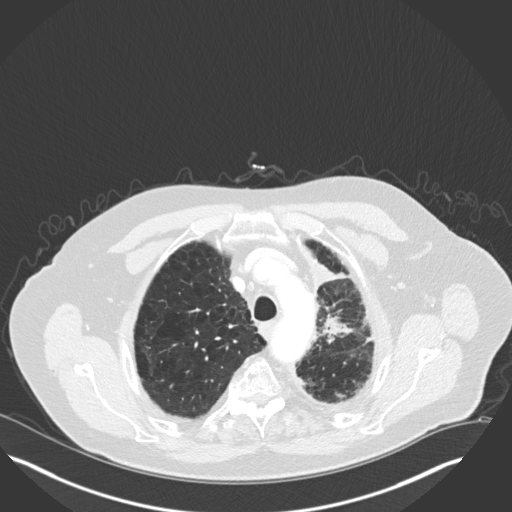
[im 136/161  lung]
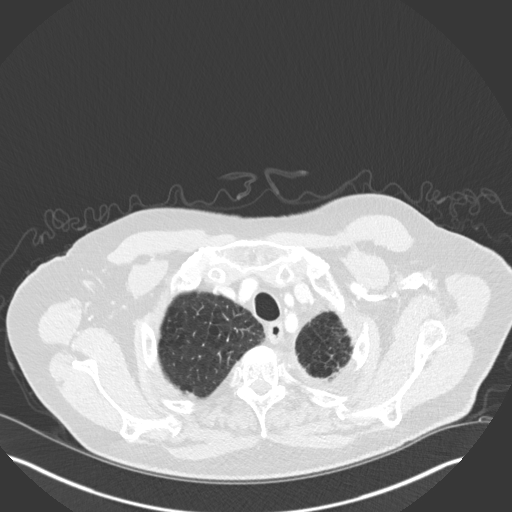
[im 148/161  lung]
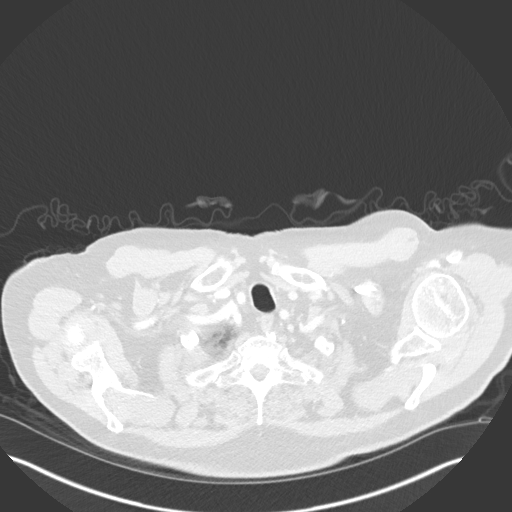

[Series 6: coronal · coronal · 0.63mm/px · 3 of 141 slices shown]
[im 29/141  lung]
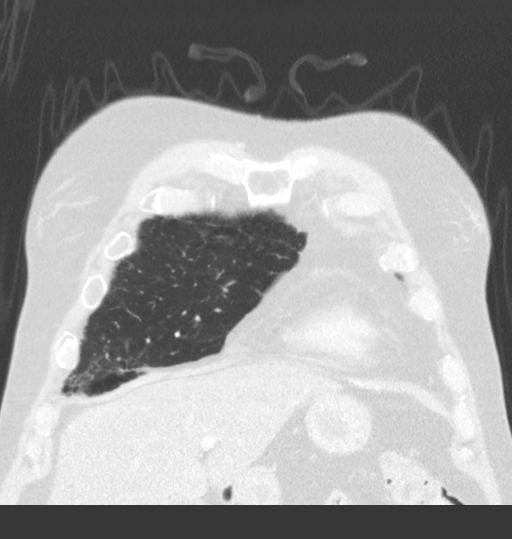
[im 57/141  lung]
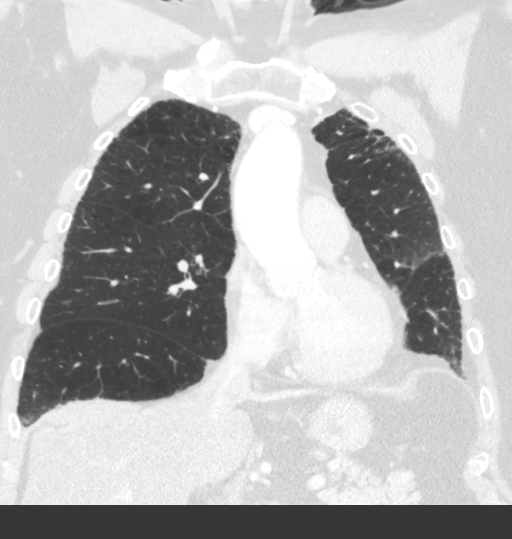
[im 85/141  lung]
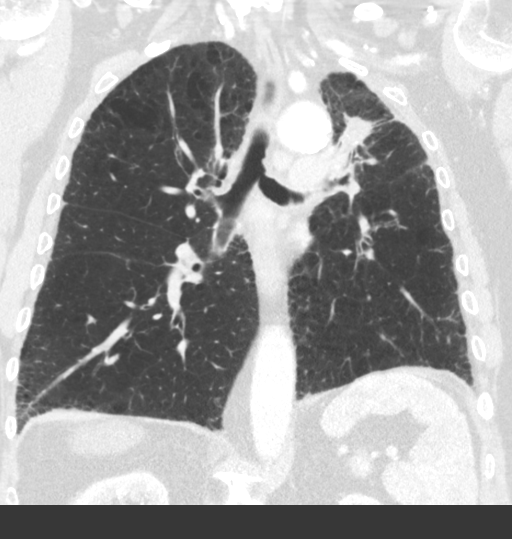

[14 of 36 positions shown; findings below may reference images not displayed]

Imaging Indication: Assess response to therapy

Interval therapy since last imaging? Yes

Initial Cancer Diagnosis

Date: 11/20/2019; Established by: Biopsy-proven

Detailed Pathology: Stage IIIb non-small cell lung cancer, squamous
cell carcinoma.

Primary Tumor location:  Left upper lobe.

Surgeries: No.

Chemotherapy: Yes; Ongoing? No; Most recent administration:
01/14/2020

Immunotherapy?  Yes; Type: Imfinzi; Ongoing? Yes

Radiation therapy? Yes; Date Range: 12/10/2019 - 01/21/2020; Target:
Left lung.

Other: Chronic lymphocytic leukemia, Stage 0.

EXAM:
CT CHEST WITH CONTRAST
FINDINGS: Cardiovascular: Normal heart size. No significant pericardial
effusion/thickening. Left anterior descending and left circumflex
coronary atherosclerosis. Atherosclerotic thoracic aorta with stable
dilated 4.1 cm ascending thoracic aorta. Normal caliber pulmonary
arteries. No central pulmonary emboli.

Mediastinum/Nodes: No discrete thyroid nodules. Unremarkable
esophagus. No axillary adenopathy. Mildly enlarged 1.0 cm left
paratracheal node (series 2/image 52), previously 1.0 cm using
similar measurement technique, unchanged. No new pathologically
enlarged mediastinal nodes. No pathologically enlarged hilar nodes.

Lungs/Pleura: No pneumothorax. No pleural effusion. Moderate
centrilobular and paraseptal emphysema with diffuse bronchial wall
thickening. Peripheral apical right upper lobe irregular 1.0 cm
nodule (series 5/image 34), previously 1.0 cm, stable. Separate
cm (series 5/image 69) and 0.4 cm (series 5/image 74) right upper
lobe solid pulmonary nodules are stable. Masslike fibrosis in left
upper lobe measures 5.5 x 3.3 cm (series 5/image 44) with associated
volume loss and distortion, previously 5.8 x 3.4 cm, mildly
decreased. Several new nodular foci of consolidation and regions of
patchy tree-in-bud opacity scattered throughout the left lung, for
example measuring 2.8 cm in the inferior lingula (series 5/image
101), 0.9 cm in the anterior left upper lobe (series 5/image 56) and
1.0 cm in the posterior left lower lobe (series 5/image 94).

Upper abdomen: Cholelithiasis.

Musculoskeletal: No aggressive appearing focal osseous lesions.
Symmetric mild bilateral gynecomastia, stable. Moderate thoracic
spondylosis.
IMPRESSION: 1. Evolving masslike fibrosis in the left upper lobe, mildly
decreased.
2. Several new nodular foci of consolidation and regions of patchy
tree-in-bud opacity scattered throughout the left lung, largest
cm in the inferior lingula. While more likely to represent
multifocal bronchopneumonia, metastatic disease cannot be excluded
and close chest CT follow-up recommended in 3 months.
3. Right upper lobe pulmonary nodules are stable.
4. Stable mild mediastinal lymphadenopathy.
5. Stable dilated 4.1 cm ascending thoracic aorta. Recommend annual
imaging followup by CTA or MRA. This recommendation follows 8121
ACCF/AHA/AATS/ACR/ASA/SCA/LOVELY MAE/KEITA/AMOY/ASOEGWU Guidelines for the
Diagnosis and Management of Patients with Thoracic Aortic Disease.
Circulation. 8121; 121: [id number]. Aortic aneurysm NOS
(OPPT8-CBK.J).
6. Two-vessel coronary atherosclerosis.
7. Cholelithiasis.
8. Aortic Atherosclerosis (OPPT8-AMH.H) and Emphysema (OPPT8-U0B.J).

## 2021-10-25 NOTE — Progress Notes (Signed)
Synopsis: Referred for organizing pneumonia by [name], [name], MD  Subjective:   PATIENT ID: Joe [name] GENDER: male DOB: May 13, 1945, MRN: [id number]  Chief Complaint  Patient presents with   Follow-up    Breathing is about the same. He has occ runny nose and cough when he wakes up in the am- non prod. He rarely uses his albuterol.    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  He says his breathing is ok. Discharged on O2 2L with exertion. O2 with exertion does improve his DOE. He doesn't have near as much cough. Not noticing any issue with worsening dyspnea as dose of prednisone is decreased (currently 30 mg daily since 2 days before visit). Getting home PT.   Was started on trelegy by PCP but he had reservations about taking it - concern over effect on HR, urinary retention.  Interval HPI: Set up for inogen last visit. CT Chest 07/01/21 with new 49mm LLL nodule.   He does use tanks for strenuous exertion, uses inogen for quick trips. Uses tanks outside when it's raining. Coughing which he attributes to postnasal drainage related to runny nose from oxygen use.   Otherwise pertinent review of systems is negative.  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   chronic stage 1- no symtoms   CLL (chronic lymphocytic leukemia) ([place]) 08/08/2014   June 14, 2014 Dr Burr Medico Stage 0   COPD mixed type ([place]) 07/26/2007   Smoker - stopped 6/14    De Quervain's tenosynovitis, right 2013-12-28   Jun 13, 2013    Depression    at times   Dysrhythmia    PAF   Dysuria 12-28-13   9/15 - poss stricture  Urol ref was offered    Gallstones 11/16/2017   Asymptomatic Pt refused surg ref   Generalized anxiety disorder 09/07/2012   Chronic   Potential benefits of a long term steroid  use as well as potential risks  and complications were explained to the patient and were aknowledged.      GERD 12/02/2006   Chronic      Grief May 29, 2016   [name] died in 06/14/14   Gynecomastia 12-28-2013   Benign B 06-13-13    Hyperlipidemia    Hypertension    Hypothyroidism 12/25/2014   Jun 14, 2014 On Levothyroxine    Intertrigo 02/02/2012   11/13    Neoplasm of uncertain behavior of skin 03/12/2013   12/14 R ear, chest    OSA on CPAP    mild with AHI 9/hr and oxygen desats as low as 75%   Paresis ([place])    right- s/p cerv decompression   PERIORBITAL CELLULITIS 02/22/2009   Qualifier: Diagnosis of  By: Diona Browner MD, Amy     Retinal detachment    L>>R     Family History  Problem Relation Age of Onset   COPD Mother    Diabetes Father    Coronary artery disease Other    Breast cancer Paternal Aunt 47   Colon cancer Neg Hx      Past Surgical History:  Procedure Laterality  Date   BICEPS TENDON REPAIR     BRONCHIAL BIOPSY  10/23/2019   Procedure: BRONCHIAL BIOPSIES;  Surgeon: [name], MD;  Location: [facility] ENDOSCOPY;  Service: Pulmonary;;   BRONCHIAL BIOPSY  11/20/2019   Procedure: BRONCHIAL BIOPSIES;  Surgeon: [name], MD;  Location: [facility] ENDOSCOPY;  Service: Pulmonary;;   BRONCHIAL BRUSHINGS  10/23/2019   Procedure: BRONCHIAL BRUSHINGS;  Surgeon: [name], MD;  Location: [facility] ENDOSCOPY;  Service: Pulmonary;;   BRONCHIAL BRUSHINGS  11/20/2019   Procedure: BRONCHIAL BRUSHINGS;  Surgeon: [name], MD;  Location: [facility] ENDOSCOPY;  Service: Pulmonary;;   BRONCHIAL NEEDLE ASPIRATION BIOPSY  10/23/2019   Procedure: BRONCHIAL NEEDLE ASPIRATION BIOPSIES;  Surgeon: [name], MD;  Location: [facility] ENDOSCOPY;  Service: Pulmonary;;   BRONCHIAL NEEDLE ASPIRATION BIOPSY  11/20/2019   Procedure: BRONCHIAL NEEDLE  ASPIRATION BIOPSIES;  Surgeon: [name], MD;  Location: [facility] ENDOSCOPY;  Service: Pulmonary;;   BRONCHIAL WASHINGS  11/20/2019   Procedure: BRONCHIAL WASHINGS;  Surgeon: [name], MD;  Location: [place];  Service: Pulmonary;;   CATARACT EXTRACTION Left    COLONOSCOPY  04-14-99   Dr Flossie Dibble polyp-TA in epic   POLYPECTOMY  04-14-99   POSTERIOR LAMINECTOMY / DECOMPRESSION CERVICAL SPINE     Dr Saintclair Halsted   RETINAL DETACHMENT SURGERY     left eye, 2007 x2, 2008 x 3   ROTATOR CUFF REPAIR  2004   right   TONSILLECTOMY  [id number]   VIDEO BRONCHOSCOPY WITH ENDOBRONCHIAL NAVIGATION N/A 10/23/2019   Procedure: VIDEO BRONCHOSCOPY WITH ENDOBRONCHIAL NAVIGATION;  Surgeon: [name], MD;  Location: [facility] ENDOSCOPY;  Service: Pulmonary;  Laterality: N/A;   VIDEO BRONCHOSCOPY WITH ENDOBRONCHIAL NAVIGATION N/A 11/20/2019   Procedure: VIDEO BRONCHOSCOPY WITH ENDOBRONCHIAL NAVIGATION;  Surgeon: [name], MD;  Location: [place] ENDOSCOPY;  Service: Pulmonary;  Laterality: N/A;   VIDEO BRONCHOSCOPY WITH ENDOBRONCHIAL ULTRASOUND N/A 10/23/2019   Procedure: VIDEO BRONCHOSCOPY WITH ENDOBRONCHIAL ULTRASOUND;  Surgeon: [name], MD;  Location: [place] ENDOSCOPY;  Service: Pulmonary;  Laterality: N/A;    Social History   Socioeconomic History   Marital status: Widowed    Spouse name: Not on file   Number of children: 1   Years of education: Not on file   Highest education level: Not on file  Occupational History   Occupation: retired  Tobacco Use   Smoking status: Former    Packs/day: 0.80    Years: 50.00    Total pack years: 40.00    Types: Cigarettes    Quit date: 08/27/2012    Years since quitting: 9.1   Smokeless tobacco: Never  Vaping Use   Vaping Use: Never used  Substance and Sexual Activity   Alcohol use: Not Currently   Drug use: No   Sexual activity: Not Currently  Other Topics Concern   Not on file  Social History Narrative   Not on file   Social Determinants of Health    Financial Resource Strain: Low Risk  (09/23/2021)   Overall Financial Resource Strain (CARDIA)    Difficulty of Paying Living Expenses: Not hard at all  Food Insecurity: No Food Insecurity (09/23/2021)   Hunger Vital Sign    Worried About Running Out of Food in the Last Year: Never true    [place] in the Last Year: Never true  Transportation Needs: No Transportation Needs (09/23/2021)   PRAPARE - Transportation    Lack of Transportation (Medical): No    Lack of  Transportation (Non-Medical): No  Physical Activity: Insufficiently Active (09/23/2021)   Exercise Vital Sign    Days of Exercise per Week: 4 days    Minutes of Exercise per Session: 30 min  Stress: No Stress Concern Present (09/23/2021)   [place]    Feeling of Stress : Not at all  Social Connections: Moderately Integrated (09/23/2021)   Social Connection and Isolation Panel [NHANES]    Frequency of Communication with Friends and Family: More than three times a week    Frequency of Social Gatherings with Friends and Family: Three times a week    Attends Religious Services: 1 to 4 times per year    Active Member of Clubs or Organizations: Yes    Attends Archivist Meetings: More than 4 times per year    Marital Status: Widowed  Intimate Partner Violence: Not At Risk (09/23/2021)   Humiliation, Afraid, Rape, and Kick questionnaire    Fear of Current or Ex-Partner: No    Emotionally Abused: No    Physically Abused: No    Sexually Abused: No     No Known Allergies   Outpatient Medications Prior to Visit  Medication Sig Dispense Refill   albuterol (PROVENTIL) (2.5 MG/3ML) 0.083% nebulizer solution Take 3 mLs (2.5 mg total) by nebulization every 6 (six) hours as needed for wheezing or shortness of breath. 75 mL 12   amLODipine (NORVASC) 5 MG tablet TAKE 1 TABLET BY MOUTH EVERY DAY 90 tablet 0   apixaban (ELIQUIS) 5 MG TABS tablet TAKE 1 TABLET(5 MG)  BY MOUTH TWICE DAILY 180 tablet 1   atenolol (TENORMIN) 25 MG tablet Take 1 tablet (25 mg total) by mouth daily as needed (palpitations). 90 tablet 0   Cholecalciferol 25 MCG (1000 UT) tablet Take 1,000 Units by mouth daily.     clotrimazole-betamethasone (LOTRISONE) cream Apply topically 2 (two) times daily. (Patient taking differently: Apply topically 2 (two) times daily. As needed) 45 g 2   diazepam (VALIUM) 5 MG tablet Take 1 tablet (5 mg total) by mouth every 12 (twelve) hours as needed for anxiety. 180 tablet 1   guaiFENesin (MUCINEX) 600 MG 12 hr tablet Take 1 tablet (600 mg total) by mouth 2 (two) times daily. (Patient taking differently: Take 600 mg by mouth 2 (two) times daily as needed.)     levothyroxine (SYNTHROID) 112 MCG tablet TAKE 1 TABLET(112 MCG) BY MOUTH DAILY 30 tablet 2   lovastatin (MEVACOR) 20 MG tablet TAKE 1 TABLET BY MOUTH EVERY NIGHT AT BEDTIME 90 tablet 2   Multiple Vitamin (MULTIVITAMIN) tablet Take 1 tablet by mouth daily. Centrum Silver.     Polyethyl Glycol-Propyl Glycol (SYSTANE) 0.4-0.3 % SOLN Place 1 drop into both eyes daily as needed (Dry eyes). Ultra     famotidine (PEPCID) 20 MG tablet Take 1 tablet (20 mg total) by mouth at bedtime. 30 tablet 1   Fluticasone-Umeclidin-Vilant (TRELEGY ELLIPTA) 100-62.5-25 MCG/ACT AEPB Inhale 1 puff into the lungs daily. 1 each 11   No facility-administered medications prior to visit.       Objective:   Physical Exam:  General appearance: 76 y.o., male, NAD, conversant  Eyes: anicteric sclerae; PERRL, tracking appropriately HENT: NCAT; MMM Neck: Trachea midline; no lymphadenopathy, no JVD Lungs: Faint expiratory wheeze bl, with normal respiratory effort CV: RRR, no murmur  Abdomen: Soft, non-tender; non-distended, BS present  Extremities: No peripheral edema, warm Skin: Normal turgor and texture; no rash Psych: Appropriate affect Neuro:  Alert and oriented to person and place, no focal deficit     Vitals:    10/27/21 1017  BP: 112/64  Pulse: 68  Temp: (!) 97 F (36.1 C)  TempSrc: Oral  SpO2: 94%  Weight: 185 lb (83.9 kg)  Height: 5\' 11"  (1.803 m)     94% on 2 LPM  BMI Readings from Last 3 Encounters:  10/27/21 25.80 kg/m  09/23/21 27.25 kg/m  07/29/21 27.06 kg/m   Wt Readings from Last 3 Encounters:  10/27/21 185 lb (83.9 kg)  09/23/21 195 lb 6.4 oz (88.6 kg)  07/29/21 194 lb (88 kg)     CBC    Component Value Date/Time   WBC 13.1 (H) 06/30/2021 1224   WBC 19.0 Repeated and verified X2. (HH) 02/24/2021 1029   RBC 4.98 06/30/2021 1224   HGB 14.5 06/30/2021 1224   HGB 17.2 (H) 03/10/2017 0911   HCT 45.0 06/30/2021 1224   HCT 52.5 (H) 03/10/2017 0911   PLT 300 06/30/2021 1224   PLT 241 03/10/2017 0911   MCV 90.4 06/30/2021 1224   MCV 96.7 03/10/2017 0911   MCH 29.1 06/30/2021 1224   MCHC 32.2 06/30/2021 1224   RDW 12.9 06/30/2021 1224   RDW 13.6 03/10/2017 0911   LYMPHSABS 2.2 06/30/2021 1224   LYMPHSABS 9.8 (H) 03/10/2017 0911   MONOABS 1.6 (H) 06/30/2021 1224   MONOABS 1.0 (H) 03/10/2017 0911   EOSABS 0.1 06/30/2021 1224   EOSABS 0.3 03/10/2017 0911   BASOSABS 0.1 06/30/2021 1224   BASOSABS 0.1 03/10/2017 0911     Chest Imaging: CTA Chest 02/09/21 reviewed by me with peripheral/subpleural consolidation  CT Chest 03/2021 with decreased burden of peripherap/subpleural reticular/consolidative opacities  CT Chest 06/30/21 reviewed by me with interval development of 28mm LLL nodule  Pulmonary Functions Testing Results:    Latest Ref Rng & Units 05/05/2021   11:48 AM 01/02/2020    1:47 PM  PFT Results  FVC-Pre L 2.44  2.61   FVC-Predicted Pre % 55  58   FVC-Post L 2.75  3.13   FVC-Predicted Post % 62  69   Pre FEV1/FVC % % 52  54   Post FEV1/FCV % % 52  53   FEV1-Pre L 1.27  1.41   FEV1-Predicted Pre % 39  43   FEV1-Post L 1.42  1.66   DLCO uncorrected ml/min/mmHg 13.56  16.70   DLCO UNC% % 52  63   DLCO corrected ml/min/mmHg 13.56  16.56   DLCO COR  %Predicted % 52  63   DLVA Predicted % 77  84   TLC L 6.02  6.87   TLC % Predicted % 83  94   RV % Predicted % 119  153    Reviewed by me with some decline in lung function since last PFT but not clinically significant  Echocardiogram:   TTE 1. Left ventricular ejection fraction, by estimation, is 60 to 65%. Left  ventricular ejection fraction by 3D volume is 64 %. The left ventricle has  normal function. The left ventricle has no regional wall motion  abnormalities. Left ventricular diastolic   parameters are consistent with Grade I diastolic dysfunction (impaired  relaxation). The average left ventricular global longitudinal strain is  -23.0 %. The global longitudinal strain is normal.   2. Right ventricular systolic function is normal. The right ventricular  size is normal.   3. The mitral valve is grossly normal. Trivial mitral valve  regurgitation.   4. The aortic  valve is tricuspid. Aortic valve regurgitation is mild to  moderate. Aortic valve sclerosis is present, with no evidence of aortic  valve stenosis. Aortic valve mean gradient measures 10.0 mmHg.   5. Aortic dilatation noted. There is borderline dilatation of the aortic  root, measuring 39 mm. There is mild dilatation of the ascending aorta,  measuring 42 mm.   6. The inferior vena cava is normal in size with greater than 50%       Assessment & Plan:   # Organizing pneumonia:  Likely related to imfinzi, would be grade 3 pneumonitis. Resolved.  # COPD gold functional group B  # Chronic hypoxic respiratory failure: 2L with exertion. DME supplier [employer].  # Stage IIIb NSCLC s/p chemoXRT and imfinzi:  # New 26mm LLL nodule  Plan: - he wishes to wait till October for surveillance CT Chest as already ordered - declines pulmonary rehab, will continue his own home exercises - have encouraged to try trelegy and discussed benefits re: decreased risk of AECOPD and mortality relative to non-triple therapy in advanced  COPD, he declines, he prefers to instead use albuterol neb twice daily   RTC 6 months      Maryjane Hurter, MD [facility] 10/27/2021 10:36 AM

## 2021-10-27 ENCOUNTER — Ambulatory Visit (INDEPENDENT_AMBULATORY_CARE_PROVIDER_SITE_OTHER): Payer: Medicare Other | Admitting: Student

## 2021-10-27 ENCOUNTER — Encounter: Payer: Self-pay | Admitting: Student

## 2021-10-27 VITALS — BP 112/64 | HR 68 | Temp 97.0°F | Ht 71.0 in | Wt 185.0 lb

## 2021-10-27 DIAGNOSIS — J9611 Chronic respiratory failure with hypoxia: Secondary | ICD-10-CM

## 2021-10-27 DIAGNOSIS — J449 Chronic obstructive pulmonary disease, unspecified: Secondary | ICD-10-CM | POA: Diagnosis not present

## 2021-10-27 NOTE — Patient Instructions (Signed)
-   Call us or send my chart message if you'd like to try stiolto or alternative similar inhaler for COPD - See you in 6 months

## 2021-10-29 ENCOUNTER — Ambulatory Visit (INDEPENDENT_AMBULATORY_CARE_PROVIDER_SITE_OTHER): Payer: Medicare Other | Admitting: Internal Medicine

## 2021-10-29 ENCOUNTER — Encounter: Payer: Self-pay | Admitting: Internal Medicine

## 2021-10-29 DIAGNOSIS — M545 Low back pain, unspecified: Secondary | ICD-10-CM

## 2021-10-29 DIAGNOSIS — F4321 Adjustment disorder with depressed mood: Secondary | ICD-10-CM | POA: Diagnosis not present

## 2021-10-29 DIAGNOSIS — C3492 Malignant neoplasm of unspecified part of left bronchus or lung: Secondary | ICD-10-CM | POA: Diagnosis not present

## 2021-10-29 DIAGNOSIS — I10 Essential (primary) hypertension: Secondary | ICD-10-CM

## 2021-10-29 DIAGNOSIS — E039 Hypothyroidism, unspecified: Secondary | ICD-10-CM | POA: Diagnosis not present

## 2021-10-29 DIAGNOSIS — J449 Chronic obstructive pulmonary disease, unspecified: Secondary | ICD-10-CM

## 2021-10-29 DIAGNOSIS — I2510 Atherosclerotic heart disease of native coronary artery without angina pectoris: Secondary | ICD-10-CM

## 2021-10-29 NOTE — Assessment & Plan Note (Signed)
Cont on Norvasc, Atenolol

## 2021-10-29 NOTE — Assessment & Plan Note (Signed)
Cont on O2 .  

## 2021-10-29 NOTE — Progress Notes (Signed)
Subjective:  Patient ID: Joe [name], male    DOB: Nov 15, 1945  Age: 76 y.o. MRN: [id number]  CC: No chief complaint on file.   HPI ANTWOIN LACKEY presents for COPD, lung cancer, HTN f/u C/o LBP after lifting a heavy AC 2 cats died  Outpatient Medications Prior to Visit  Medication Sig Dispense Refill   albuterol (PROVENTIL) (2.5 MG/3ML) 0.083% nebulizer solution Take 3 mLs (2.5 mg total) by nebulization every 6 (six) hours as needed for wheezing or shortness of breath. 75 mL 12   amLODipine (NORVASC) 5 MG tablet TAKE 1 TABLET BY MOUTH EVERY DAY 90 tablet 0   apixaban (ELIQUIS) 5 MG TABS tablet TAKE 1 TABLET(5 MG) BY MOUTH TWICE DAILY 180 tablet 1   atenolol (TENORMIN) 25 MG tablet Take 1 tablet (25 mg total) by mouth daily as needed (palpitations). 90 tablet 0   Cholecalciferol 25 MCG (1000 UT) tablet Take 1,000 Units by mouth daily.     clotrimazole-betamethasone (LOTRISONE) cream Apply topically 2 (two) times daily. (Patient taking differently: Apply topically 2 (two) times daily. As needed) 45 g 2   diazepam (VALIUM) 5 MG tablet Take 1 tablet (5 mg total) by mouth every 12 (twelve) hours as needed for anxiety. 180 tablet 1   guaiFENesin (MUCINEX) 600 MG 12 hr tablet Take 1 tablet (600 mg total) by mouth 2 (two) times daily. (Patient taking differently: Take 600 mg by mouth 2 (two) times daily as needed.)     levothyroxine (SYNTHROID) 112 MCG tablet TAKE 1 TABLET(112 MCG) BY MOUTH DAILY 30 tablet 2   lovastatin (MEVACOR) 20 MG tablet TAKE 1 TABLET BY MOUTH EVERY NIGHT AT BEDTIME 90 tablet 2   Multiple Vitamin (MULTIVITAMIN) tablet Take 1 tablet by mouth daily. Centrum Silver.     Polyethyl Glycol-Propyl Glycol (SYSTANE) 0.4-0.3 % SOLN Place 1 drop into both eyes daily as needed (Dry eyes). Ultra     No facility-administered medications prior to visit.    ROS: Review of Systems  Constitutional:  Positive for fatigue. Negative for appetite change and unexpected weight change.   HENT:  Negative for congestion, nosebleeds, sneezing, sore throat and trouble swallowing.   Eyes:  Negative for itching and visual disturbance.  Respiratory:  Positive for shortness of breath. Negative for cough.   Cardiovascular:  Negative for chest pain, palpitations and leg swelling.  Gastrointestinal:  Negative for abdominal distention, blood in stool, diarrhea and nausea.  Genitourinary:  Negative for frequency and hematuria.  Musculoskeletal:  Positive for back pain. Negative for gait problem, joint swelling and neck pain.  Skin:  Negative for rash.  Neurological:  Negative for dizziness, tremors, speech difficulty and weakness.  Psychiatric/Behavioral:  Negative for agitation, dysphoric mood and sleep disturbance. The patient is not nervous/anxious.     Objective:  BP 120/70 (BP Location: Left Arm, Patient Position: Sitting, Cuff Size: Large)   Pulse 66   Temp 97.8 F (36.6 C) (Oral)   Ht 5\' 11"  (1.803 m)   Wt 186 lb (84.4 kg)   SpO2 92%   BMI 25.94 kg/m   BP Readings from Last 3 Encounters:  10/29/21 120/70  10/27/21 112/64  09/23/21 122/66    Wt Readings from Last 3 Encounters:  10/29/21 186 lb (84.4 kg)  10/27/21 185 lb (83.9 kg)  09/23/21 195 lb 6.4 oz (88.6 kg)    Physical Exam Constitutional:      General: He is not in acute distress.    Appearance: He is well-developed.  He is obese.     Comments: NAD  Eyes:     Conjunctiva/sclera: Conjunctivae normal.     Pupils: Pupils are equal, round, and reactive to light.  Neck:     Thyroid: No thyromegaly.     Vascular: No JVD.  Cardiovascular:     Rate and Rhythm: Normal rate and regular rhythm.     Heart sounds: Normal heart sounds. No murmur heard.    No friction rub. No gallop.  Pulmonary:     Effort: Pulmonary effort is normal. No respiratory distress.     Breath sounds: Normal breath sounds. No wheezing or rales.  Chest:     Chest wall: No tenderness.  Abdominal:     General: Bowel sounds are normal.  There is no distension.     Palpations: Abdomen is soft. There is no mass.     Tenderness: There is no abdominal tenderness. There is no guarding or rebound.  Musculoskeletal:        General: Tenderness present. Normal range of motion.     Cervical back: Normal range of motion.  Lymphadenopathy:     Cervical: No cervical adenopathy.  Skin:    General: Skin is warm and dry.     Findings: No rash.  Neurological:     Mental Status: He is alert and oriented to person, place, and time.     Cranial Nerves: No cranial nerve deficit.     Motor: No abnormal muscle tone.     Coordination: Coordination normal.     Gait: Gait normal.     Deep Tendon Reflexes: Reflexes are normal and symmetric.  Psychiatric:        Behavior: Behavior normal.        Thought Content: Thought content normal.        Judgment: Judgment normal.   LS w/pain Decreased BS   Lab Results  Component Value Date   WBC 13.1 (H) 06/30/2021   HGB 14.5 06/30/2021   HCT 45.0 06/30/2021   PLT 300 06/30/2021   GLUCOSE 88 07/22/2021   CHOL 153 11/23/2018   TRIG 201.0 (H) 11/23/2018   HDL 38.20 (L) 11/23/2018   LDLDIRECT 99.0 11/23/2018   LDLCALC 80 11/07/2017   ALT 9 07/22/2021   AST 14 07/22/2021   NA 141 07/22/2021   K 4.3 07/22/2021   CL 101 07/22/2021   CREATININE 0.84 07/22/2021   BUN 11 07/22/2021   CO2 31 07/22/2021   TSH 8.92 (H) 07/22/2021   PSA 0.06 (L) 07/22/2021   INR 1.4 (H) 10/15/2019    CT Chest W Contrast  Result Date: 07/01/2021 CLINICAL DATA:  Primary Cancer Type: Lung * Tracking Code: BO * Imaging Indication: Routine surveillance Interval therapy since last imaging? No Initial Cancer Diagnosis Date: 11/20/19; Established by: Biopsy-proven Detailed Pathology: Stage IIIb non-small cell lung cancer, squamous cell carcinoma. Primary Tumor location: Left upper lobe. Surgeries: No thoracic. Chemotherapy: Yes; Ongoing? No; Most recent administration: 01/14/2020 Immunotherapy?  Yes; Type: Imfinzi;  Ongoing? No Radiation therapy? Yes; Date Range: 12/10/2019 - 01/21/2020; Target: Left lung EXAM: CT CHEST WITH CONTRAST TECHNIQUE: Multidetector CT imaging of the chest was performed during intravenous contrast administration. RADIATION DOSE REDUCTION: This exam was performed according to the departmental dose-optimization program which includes automated exposure control, adjustment of the mA and/or kV according to patient size and/or use of iterative reconstruction technique. CONTRAST:  69mL OMNIPAQUE IOHEXOL 300 MG/ML  SOLN COMPARISON:  Most recent CT chest 03/31/2021. FINDINGS: Cardiovascular: The heart size is  normal. No substantial pericardial effusion. Coronary artery calcification is evident. Ascending thoracic aorta measures 4.1 cm diameter. Mild atherosclerotic calcification is noted in the wall of the thoracic aorta. Mediastinum/Nodes: No mediastinal lymphadenopathy. There is no hilar lymphadenopathy. The esophagus has normal imaging features. There is no axillary lymphadenopathy. Lungs/Pleura: Centrilobular and paraseptal emphysema evident. Stable right-sided lung nodules including 7-8 mm peripheral right upper lobe nodule on 30/7 and 5 mm perifissural nodule anteriorly in the right lung on 67/7. Volume loss left hemithorax again noted with stable post treatment scarring in the left upper lobe in superior segment left lower lobe. 7 mm left lower lobe nodule on 102/7 is new in the interval. No pleural effusion. Upper Abdomen: Calcified gallstones again noted. Musculoskeletal: No worrisome lytic or sclerotic osseous abnormality. IMPRESSION: 1. Stable exam.  No new or progressive interval findings. 2. Post treatment changes left upper lobe without findings to suggest recurrent or metastatic disease. 3. There is a new 7 mm irregular nodule in the left lower lobe, likely infectious/inflammatory. Attention on follow-up recommended. 4. 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 Continued  attention on restaging/surveillance exams recommended. 5.  Emphysema (ICD10-J43.9) and Aortic Atherosclerosis (ICD10-170.0) Electronically Signed   By: Misty Stanley M.D.   On: 07/01/2021 10:42    Assessment & Plan:   Problem List Items Addressed This Visit     COPD mixed type ([place])    Cont on O2      Essential hypertension    Cont on Norvasc, Atenolol      Hypothyroidism    On Levothyroxine      Low back pain    New Tylenol prn        Stage III squamous cell carcinoma of left lung ([place])    F/u w/Dr Baxter Hire - - stopped in 12/2020         No orders of the defined types were placed in this encounter.     Follow-up: No follow-ups on file.  Walker Kehr, MD

## 2021-10-29 NOTE — Assessment & Plan Note (Signed)
2 cats died in October 11, 2021

## 2021-10-29 NOTE — Assessment & Plan Note (Signed)
New Tylenol prn

## 2021-10-29 NOTE — Assessment & Plan Note (Signed)
F/u w/Dr Baxter Hire - - stopped in 12/2020

## 2021-10-29 NOTE — Assessment & Plan Note (Signed)
On Levothyroxine 

## 2021-11-09 ENCOUNTER — Other Ambulatory Visit: Payer: Self-pay | Admitting: Physician Assistant

## 2021-11-09 ENCOUNTER — Other Ambulatory Visit: Payer: Self-pay | Admitting: Internal Medicine

## 2021-11-09 DIAGNOSIS — H59812 Chorioretinal scars after surgery for detachment, left eye: Secondary | ICD-10-CM | POA: Diagnosis not present

## 2021-11-09 DIAGNOSIS — E039 Hypothyroidism, unspecified: Secondary | ICD-10-CM

## 2021-12-28 ENCOUNTER — Other Ambulatory Visit: Payer: Self-pay

## 2021-12-28 ENCOUNTER — Ambulatory Visit (HOSPITAL_COMMUNITY)
Admission: RE | Admit: 2021-12-28 | Discharge: 2021-12-28 | Disposition: A | Discharge status: Home / Self Care | Payer: Medicare Other | Source: Ambulatory Visit | Attending: Internal Medicine | Admitting: Internal Medicine

## 2021-12-28 ENCOUNTER — Inpatient Hospital Stay: Payer: Medicare Other | Attending: Internal Medicine

## 2021-12-28 ENCOUNTER — Encounter (HOSPITAL_COMMUNITY): Payer: Self-pay

## 2021-12-28 DIAGNOSIS — E039 Hypothyroidism, unspecified: Secondary | ICD-10-CM | POA: Diagnosis not present

## 2021-12-28 DIAGNOSIS — J439 Emphysema, unspecified: Secondary | ICD-10-CM | POA: Diagnosis not present

## 2021-12-28 DIAGNOSIS — C3412 Malignant neoplasm of upper lobe, left bronchus or lung: Secondary | ICD-10-CM | POA: Diagnosis not present

## 2021-12-28 DIAGNOSIS — D696 Thrombocytopenia, unspecified: Secondary | ICD-10-CM | POA: Diagnosis not present

## 2021-12-28 DIAGNOSIS — Z87891 Personal history of nicotine dependence: Secondary | ICD-10-CM | POA: Diagnosis not present

## 2021-12-28 DIAGNOSIS — C3490 Malignant neoplasm of unspecified part of unspecified bronchus or lung: Secondary | ICD-10-CM | POA: Insufficient documentation

## 2021-12-28 DIAGNOSIS — Z79890 Hormone replacement therapy (postmenopausal): Secondary | ICD-10-CM | POA: Diagnosis not present

## 2021-12-28 DIAGNOSIS — C9110 Chronic lymphocytic leukemia of B-cell type not having achieved remission: Secondary | ICD-10-CM | POA: Diagnosis not present

## 2021-12-28 DIAGNOSIS — I10 Essential (primary) hypertension: Secondary | ICD-10-CM | POA: Insufficient documentation

## 2021-12-28 DIAGNOSIS — R918 Other nonspecific abnormal finding of lung field: Secondary | ICD-10-CM | POA: Diagnosis not present

## 2021-12-28 LAB — CMP (CANCER CENTER ONLY)
ALT: 17 U/L (ref 0–44)
AST: 21 U/L (ref 15–41)
Albumin: 4 g/dL (ref 3.5–5.0)
Alkaline Phosphatase: 87 U/L (ref 38–126)
Anion gap: 4 — ABNORMAL LOW (ref 5–15)
BUN: 11 mg/dL (ref 8–23)
CO2: 33 mmol/L — ABNORMAL HIGH (ref 22–32)
Calcium: 9.5 mg/dL (ref 8.9–10.3)
Chloride: 103 mmol/L (ref 98–111)
Creatinine: 0.68 mg/dL (ref 0.61–1.24)
GFR, Estimated: >60 mL/min (ref >60)
Glucose, Bld: 94 mg/dL (ref 70–99)
Potassium: 3.9 mmol/L (ref 3.5–5.1)
Sodium: 140 mmol/L (ref 135–145)
Total Bilirubin: 1.3 mg/dL — ABNORMAL HIGH (ref 0.3–1.2)
Total Protein: 7.4 g/dL (ref 6.5–8.1)

## 2021-12-28 LAB — CBC WITH DIFFERENTIAL (CANCER CENTER ONLY)
Abs Immature Granulocytes: 0.04 10*3/uL (ref 0.00–0.07)
Basophils Absolute: 0.1 10*3/uL (ref 0.0–0.1)
Basophils Relative: 1 %
Eosinophils Absolute: 0.3 10*3/uL (ref 0.0–0.5)
Eosinophils Relative: 3 %
HCT: 47.1 % (ref 39.0–52.0)
Hemoglobin: 15.3 g/dL (ref 13.0–17.0)
Immature Granulocytes: 0 %
Lymphocytes Relative: 23 %
Lymphs Abs: 2.4 10*3/uL (ref 0.7–4.0)
MCH: 29.3 pg (ref 26.0–34.0)
MCHC: 32.5 g/dL (ref 30.0–36.0)
MCV: 90.1 fL (ref 80.0–100.0)
Monocytes Absolute: 1 10*3/uL (ref 0.1–1.0)
Monocytes Relative: 10 %
Neutro Abs: 6.6 10*3/uL (ref 1.7–7.7)
Neutrophils Relative %: 63 %
Platelet Count: 267 10*3/uL (ref 150–400)
RBC: 5.23 MIL/uL (ref 4.22–5.81)
RDW: 13.2 % (ref 11.5–15.5)
WBC Count: 10.4 10*3/uL (ref 4.0–10.5)
nRBC: 0 % (ref 0.0–0.2)

## 2021-12-28 MED ORDER — IOHEXOL 300 MG/ML  SOLN
75.0000 mL | Freq: Once | INTRAMUSCULAR | Status: AC | PRN
  Administered 2021-12-28: 75 mL via INTRAVENOUS

## 2021-12-28 MED ORDER — SODIUM CHLORIDE (PF) 0.9 % IJ SOLN
INTRAMUSCULAR | Status: AC
  Filled 2021-12-28: qty 50

## 2021-12-31 ENCOUNTER — Inpatient Hospital Stay (HOSPITAL_BASED_OUTPATIENT_CLINIC_OR_DEPARTMENT_OTHER): Payer: Medicare Other | Admitting: Internal Medicine

## 2021-12-31 ENCOUNTER — Other Ambulatory Visit: Payer: Self-pay

## 2021-12-31 VITALS — BP 141/74 | HR 66 | Temp 97.6°F | Resp 17 | Wt 178.6 lb

## 2021-12-31 DIAGNOSIS — C9110 Chronic lymphocytic leukemia of B-cell type not having achieved remission: Secondary | ICD-10-CM | POA: Diagnosis not present

## 2021-12-31 DIAGNOSIS — D696 Thrombocytopenia, unspecified: Secondary | ICD-10-CM | POA: Diagnosis not present

## 2021-12-31 DIAGNOSIS — C3412 Malignant neoplasm of upper lobe, left bronchus or lung: Secondary | ICD-10-CM | POA: Diagnosis not present

## 2021-12-31 DIAGNOSIS — Z79890 Hormone replacement therapy (postmenopausal): Secondary | ICD-10-CM | POA: Diagnosis not present

## 2021-12-31 DIAGNOSIS — C3490 Malignant neoplasm of unspecified part of unspecified bronchus or lung: Secondary | ICD-10-CM | POA: Diagnosis not present

## 2021-12-31 DIAGNOSIS — E039 Hypothyroidism, unspecified: Secondary | ICD-10-CM | POA: Diagnosis not present

## 2021-12-31 DIAGNOSIS — I10 Essential (primary) hypertension: Secondary | ICD-10-CM | POA: Diagnosis not present

## 2021-12-31 NOTE — Progress Notes (Signed)
[place] [phone]   [phone]  OFFICE PROGRESS NOTE  [name], [name], MD [place] [address]  DIAGNOSIS: 1) Stage IIIb non-small cell lung cancer, squamous cell carcinoma.  The patient presented with a left upper lobe lung mass as well as associated mediastinal lymphadenopathy and contralateral right hilar mild hypermetabolism.  There was heterogeneous marrow uptake in the spine but with more focal areas at L4 without imaging correlation on CT scan.  The patient was diagnosed in August 2021. 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  First dose expected on 12/13/2019. Status post 5 cycles.   Last dose was giving January 14, 2020. 2) Consolidation treatment with immunotherapy with Imfinzi 1500 mg IV every 4 weeks.  First dose February 19, 2020.  Status post 13 cycles.   CURRENT THERAPY: Observation.  INTERVAL HISTORY: Joe [name] 76 y.o. male returns to the clinic today for 6 months follow-up visit.  The patient is feeling fine today with no concerning complaints except for the baseline shortness of breath and he is currently on home oxygen.  He has no chest pain with no cough or hemoptysis.  He has no nausea, vomiting, diarrhea or constipation.  He has no headache or visual changes.  He denied having any recent weight loss or night sweats.  He is here today for evaluation with repeat CT scan of the chest for restaging of his disease.  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   chronic stage 1- no symtoms   CLL (chronic lymphocytic leukemia) ([place]) 08/08/2014   06/19/2014 Dr Burr Medico Stage 0   COPD  mixed type ([place]) 07/26/2007   Smoker - stopped 6/14    De Quervain's tenosynovitis, right 10-Jan-2014   18-Jun-2013    Depression    at times   Dysrhythmia    PAF   Dysuria 01/10/2014   9/15 - poss stricture Urol ref was offered    Gallstones 11/16/2017   Asymptomatic Pt refused surg ref   Generalized anxiety disorder 09/07/2012   Chronic   Potential benefits of a long term steroid  use as well as potential risks  and complications were explained to the patient and were aknowledged.      GERD 12/02/2006   Chronic      Grief June 11, 2016   [name] died in 06/19/2014   Gynecomastia January 10, 2014   Benign B 06/18/13    Hyperlipidemia    Hypertension    Hypothyroidism 12/25/2014   19-Jun-2014 On Levothyroxine    Intertrigo 02/02/2012   11/13    Neoplasm of uncertain behavior of skin 03/12/2013   12/14 R ear, chest    OSA on CPAP    mild with AHI 9/hr and oxygen desats as low as 75%   Paresis ([place])    right- s/p cerv decompression   PERIORBITAL CELLULITIS 02/22/2009   Qualifier: Diagnosis of  By: Diona Browner MD, Amy     Retinal detachment    L>>R    ALLERGIES:  has No Known Allergies.  MEDICATIONS:  Current Outpatient Medications  Medication Sig Dispense Refill  albuterol (PROVENTIL) (2.5 MG/3ML) 0.083% nebulizer solution Take 3 mLs (2.5 mg total) by nebulization every 6 (six) hours as needed for wheezing or shortness of breath. 75 mL 12   amLODipine (NORVASC) 5 MG tablet TAKE 1 TABLET BY MOUTH EVERY DAY 90 tablet 1   apixaban (ELIQUIS) 5 MG TABS tablet TAKE 1 TABLET(5 MG) BY MOUTH TWICE DAILY 180 tablet 1   atenolol (TENORMIN) 25 MG tablet Take 1 tablet (25 mg total) by mouth daily as needed (palpitations). 90 tablet 0   Cholecalciferol 25 MCG (1000 UT) tablet Take 1,000 Units by mouth daily.     clotrimazole-betamethasone (LOTRISONE) cream Apply topically 2 (two) times daily. (Patient taking differently: Apply topically 2 (two) times daily. As needed) 45 g 2   diazepam (VALIUM) 5 MG tablet Take 1 tablet (5 mg  total) by mouth every 12 (twelve) hours as needed for anxiety. 180 tablet 1   guaiFENesin (MUCINEX) 600 MG 12 hr tablet Take 1 tablet (600 mg total) by mouth 2 (two) times daily. (Patient taking differently: Take 600 mg by mouth 2 (two) times daily as needed.)     levothyroxine (SYNTHROID) 112 MCG tablet TAKE 1 TABLET(112 MCG) BY MOUTH DAILY 30 tablet 2   lovastatin (MEVACOR) 20 MG tablet TAKE 1 TABLET BY MOUTH EVERY NIGHT AT BEDTIME 90 tablet 2   Multiple Vitamin (MULTIVITAMIN) tablet Take 1 tablet by mouth daily. Centrum Silver.     Polyethyl Glycol-Propyl Glycol (SYSTANE) 0.4-0.3 % SOLN Place 1 drop into both eyes daily as needed (Dry eyes). Ultra     No current facility-administered medications for this visit.    SURGICAL HISTORY:  Past Surgical History:  Procedure Laterality Date   BICEPS TENDON REPAIR     BRONCHIAL BIOPSY  10/23/2019   Procedure: BRONCHIAL BIOPSIES;  Surgeon: [name], MD;  Location: [facility] ENDOSCOPY;  Service: Pulmonary;;   BRONCHIAL BIOPSY  11/20/2019   Procedure: BRONCHIAL BIOPSIES;  Surgeon: [name], MD;  Location: [facility] ENDOSCOPY;  Service: Pulmonary;;   BRONCHIAL BRUSHINGS  10/23/2019   Procedure: BRONCHIAL BRUSHINGS;  Surgeon: [name], MD;  Location: [facility] ENDOSCOPY;  Service: Pulmonary;;   BRONCHIAL BRUSHINGS  11/20/2019   Procedure: BRONCHIAL BRUSHINGS;  Surgeon: [name], MD;  Location: [facility] ENDOSCOPY;  Service: Pulmonary;;   BRONCHIAL NEEDLE ASPIRATION BIOPSY  10/23/2019   Procedure: BRONCHIAL NEEDLE ASPIRATION BIOPSIES;  Surgeon: [name], MD;  Location: [facility] ENDOSCOPY;  Service: Pulmonary;;   BRONCHIAL NEEDLE ASPIRATION BIOPSY  11/20/2019   Procedure: BRONCHIAL NEEDLE ASPIRATION BIOPSIES;  Surgeon: [name], MD;  Location: [facility] ENDOSCOPY;  Service: Pulmonary;;   BRONCHIAL WASHINGS  11/20/2019   Procedure: BRONCHIAL WASHINGS;  Surgeon: [name], MD;  Location: [place];  Service: Pulmonary;;   CATARACT EXTRACTION Left     COLONOSCOPY  04-14-99   Dr Flossie Dibble polyp-TA in epic   POLYPECTOMY  04-14-99   POSTERIOR LAMINECTOMY / DECOMPRESSION CERVICAL SPINE     Dr Saintclair Halsted   RETINAL DETACHMENT SURGERY     left eye, 2007 x2, 2008 x 3   ROTATOR CUFF REPAIR  2004   right   TONSILLECTOMY  [id number]   VIDEO BRONCHOSCOPY WITH ENDOBRONCHIAL NAVIGATION N/A 10/23/2019   Procedure: VIDEO BRONCHOSCOPY WITH ENDOBRONCHIAL NAVIGATION;  Surgeon: [name], MD;  Location: [facility] ENDOSCOPY;  Service: Pulmonary;  Laterality: N/A;   VIDEO BRONCHOSCOPY WITH ENDOBRONCHIAL NAVIGATION N/A 11/20/2019   Procedure: VIDEO BRONCHOSCOPY WITH ENDOBRONCHIAL NAVIGATION;  Surgeon: [name], MD;  Location:  [place] [facility] ENDOSCOPY;  Service: Pulmonary;  Laterality: N/A;   VIDEO BRONCHOSCOPY WITH ENDOBRONCHIAL ULTRASOUND N/A 10/23/2019   Procedure: VIDEO BRONCHOSCOPY WITH ENDOBRONCHIAL ULTRASOUND;  Surgeon: [name], MD;  Location: [facility] ENDOSCOPY;  Service: Pulmonary;  Laterality: N/A;    REVIEW OF SYSTEMS:  A comprehensive review of systems was negative except for: Constitutional: positive for fatigue Respiratory: positive for dyspnea on exertion   PHYSICAL EXAMINATION: General appearance: alert, cooperative, and no distress Head: Normocephalic, without obvious abnormality, atraumatic Neck: no adenopathy, no JVD, supple, symmetrical, trachea midline, and thyroid not enlarged, symmetric, no tenderness/mass/nodules Lymph nodes: Cervical, supraclavicular, and axillary nodes normal. Resp: clear to auscultation bilaterally Back: symmetric, no curvature. ROM normal. No CVA tenderness. Cardio: regular rate and rhythm, S1, S2 normal, no murmur, click, rub or gallop GI: soft, non-tender; bowel sounds normal; no masses,  no organomegaly Extremities: extremities normal, atraumatic, no cyanosis or edema  ECOG PERFORMANCE STATUS: 1 - Symptomatic but completely ambulatory  Blood pressure (!) 141/74, pulse 66, temperature 97.6 F (36.4 C), temperature source  Oral, resp. rate 17, weight 178 lb 9 oz (81 kg), SpO2 92 %.  LABORATORY DATA: Lab Results  Component Value Date   WBC 10.4 12/28/2021   HGB 15.3 12/28/2021   HCT 47.1 12/28/2021   MCV 90.1 12/28/2021   PLT 267 12/28/2021      Chemistry      Component Value Date/Time   NA 140 12/28/2021 1219   NA 143 03/10/2017 0911   K 3.9 12/28/2021 1219   K 4.7 03/10/2017 0911   CL 103 12/28/2021 1219   CO2 33 (H) 12/28/2021 1219   CO2 30 (H) 03/10/2017 0911   BUN 11 12/28/2021 1219   BUN 10.7 03/10/2017 0911   CREATININE 0.68 12/28/2021 1219   CREATININE 0.8 03/10/2017 0911      Component Value Date/Time   CALCIUM 9.5 12/28/2021 1219   CALCIUM 9.4 03/10/2017 0911   ALKPHOS 87 12/28/2021 1219   ALKPHOS 76 03/10/2017 0911   AST 21 12/28/2021 1219   AST 34 03/10/2017 0911   ALT 17 12/28/2021 1219   ALT 45 03/10/2017 0911   BILITOT 1.3 (H) 12/28/2021 1219   BILITOT 1.41 (H) 03/10/2017 0911       RADIOGRAPHIC STUDIES: CT Chest W Contrast  Result Date: 12/29/2021 CLINICAL DATA:  Non-small cell lung cancer staging; * Tracking Code: BO * EXAM: CT CHEST WITH CONTRAST TECHNIQUE: Multidetector CT imaging of the chest was performed during intravenous contrast administration. RADIATION DOSE REDUCTION: This exam was performed according to the departmental dose-optimization program which includes automated exposure control, adjustment of the mA and/or kV according to patient size and/or use of iterative reconstruction technique. CONTRAST:  45m OMNIPAQUE IOHEXOL 300 MG/ML  SOLN COMPARISON:  Chest CT dated June 30, 2021 FINDINGS: Cardiovascular: Normal heart size. No pericardial effusion. Normal caliber thoracic aorta with moderate atherosclerotic disease. Severe coronary artery calcifications. Mediastinum/Nodes: Esophagus unremarkable. Subcarinal node is slightly increased in size, measuring 10 mm on series 2, image 73, previously 8 mm. Lungs/Pleura: Central airways are patent. Moderate paraseptal  and centrilobular emphysema. Left upper lobe postradiation changes with area increased soft tissue in the left upper lobe measuring approximately 2.1 x 1.3 cm on series 5 image 44, previously this area measured approximately 1.5 x 1.2 cm. New linear opacity seen more inferiorly in the left upper lobe on series 5, image 53, likely atelectasis. Stable scattered small solid pulmonary nodules. Reference right upper lobe nodule measuring 4 mm on series 5,  image 72. No pleural effusion or pneumothorax. Upper Abdomen: No acute abnormality. Musculoskeletal: New age indeterminate mild compression deformity of L1. no aggressive appearing osseous lesions. IMPRESSION: 1. Left upper lobe postradiation changes with associated subtle area of increased soft tissue, differential considerations include evolving postradiation changes or recurrent disease. Recommend PET-CT for further evaluation. 2. Subcarinal node is slightly increased in size, possibly reactive. Recommend attention on follow-up. 3. New age indeterminate mild compression deformity of L1. Correlate for point tenderness. 4. Aortic Atherosclerosis (ICD10-I70.0) and Emphysema (ICD10-J43.9). Electronically Signed   By: Yetta Glassman M.D.   On: 12/29/2021 17:31     ASSESSMENT AND PLAN: This is a very pleasant 76 years old white male with a stage IIIb non-small cell lung cancer, squamous cell carcinoma diagnosed in August of 2021.  The patient also has a history of stage 0 CLL.  He underwent a course of concurrent chemoradiation with weekly carboplatin for AUC of 2 and paclitaxel 45 mg/M2 status post 5 cycles of the treatment.  He tolerated this treatment well with no concerning adverse effect except for mild odynophagia and dysphagia as well as thrombocytopenia. His scan showed improvement of his disease with decrease in the size of the left upper lobe lung mass as well as decrease in the mediastinal lymphadenopathy and no evidence of metastatic disease. The patient  completed consolidation treatment with immunotherapy with Imfinzi 1500 mg IV every 4 weeks status post 13 cycles. His last CT scan of the chest 6 months ago showed stable disease with no concerning findings for progression but there was development of new 0.7 cm irregular nodule in the left lower lobe likely infectious/inflammatory but would require attention on follow-up imaging studies. The patient had repeat CT scan of the chest performed recently.  I personally and independently reviewed the scan images and discussed the result and showed the images to the patient today. His scan showed the left upper lobe postradiation changes with associated areas of increased soft tissue concerning for evolving postradiation changes or recurrent disease and PET scan was recommended for further evaluation.  He also had slightly increased size of subcarinal lymph node. I discussed the results with the patient and recommended for him to proceed with a PET scan in the next few weeks for further evaluation and rule out any disease recurrence. I will see him back for follow-up visit in 3 weeks for evaluation and discussion of his PET scan results and recommendation regarding his condition. For the hypothyroidism, he is currently on levothyroxine and he has an appointment with his primary care physician early next months for adjustment of his medication. The patient was advised to call immediately if he has any other concerning symptoms in the interval.  The patient voices understanding of current disease status and treatment options and is in agreement with the current care plan. The total time spent in the appointment was 30 minutes.  All questions were answered. The patient knows to call the clinic with any problems, questions or concerns. We can certainly see the patient much sooner if necessary.  Disclaimer: This note was dictated with voice recognition software. Similar sounding words can inadvertently be  transcribed and may not be corrected upon review.

## 2022-01-04 ENCOUNTER — Telehealth: Payer: Self-pay

## 2022-01-04 NOTE — Telephone Encounter (Signed)
Error

## 2022-01-05 ENCOUNTER — Ambulatory Visit (INDEPENDENT_AMBULATORY_CARE_PROVIDER_SITE_OTHER): Payer: Medicare Other

## 2022-01-05 ENCOUNTER — Other Ambulatory Visit: Payer: Medicare Other

## 2022-01-05 ENCOUNTER — Ambulatory Visit (INDEPENDENT_AMBULATORY_CARE_PROVIDER_SITE_OTHER): Payer: Medicare Other | Admitting: *Deleted

## 2022-01-05 DIAGNOSIS — M545 Low back pain, unspecified: Secondary | ICD-10-CM

## 2022-01-05 DIAGNOSIS — Z23 Encounter for immunization: Secondary | ICD-10-CM

## 2022-01-05 NOTE — Progress Notes (Addendum)
Patient here for his high dose flu vaccine. Given in left deltoid. Patient tolerated well   Medical screening examination/treatment/procedure(s) were performed by non-physician practitioner and as supervising physician I was immediately available for consultation/collaboration.  I agree with above. Lew Dawes, MD

## 2022-01-06 ENCOUNTER — Telehealth: Payer: Self-pay | Admitting: *Deleted

## 2022-01-06 NOTE — Telephone Encounter (Signed)
Call Report- Lumbar Spine  Age-indeterminate compression fractures of L1 and L4 and acute injury is not excluded. No definite retropulsion though this is poorly assessed radiographically.Marland KitchenJohny Chess

## 2022-01-06 NOTE — Telephone Encounter (Signed)
See xray report pt made aware.Marland KitchenJohny [name]

## 2022-01-15 ENCOUNTER — Ambulatory Visit (HOSPITAL_COMMUNITY)
Admission: RE | Admit: 2022-01-15 | Discharge: 2022-01-15 | Disposition: A | Discharge status: Home / Self Care | Payer: Medicare Other | Source: Ambulatory Visit | Attending: Internal Medicine | Admitting: Internal Medicine

## 2022-01-15 DIAGNOSIS — J439 Emphysema, unspecified: Secondary | ICD-10-CM | POA: Insufficient documentation

## 2022-01-15 DIAGNOSIS — K8000 Calculus of gallbladder with acute cholecystitis without obstruction: Secondary | ICD-10-CM | POA: Diagnosis not present

## 2022-01-15 DIAGNOSIS — C3490 Malignant neoplasm of unspecified part of unspecified bronchus or lung: Secondary | ICD-10-CM | POA: Insufficient documentation

## 2022-01-15 DIAGNOSIS — I700 Atherosclerosis of aorta: Secondary | ICD-10-CM | POA: Insufficient documentation

## 2022-01-15 DIAGNOSIS — R599 Enlarged lymph nodes, unspecified: Secondary | ICD-10-CM | POA: Diagnosis not present

## 2022-01-15 DIAGNOSIS — R911 Solitary pulmonary nodule: Secondary | ICD-10-CM | POA: Diagnosis not present

## 2022-01-15 LAB — GLUCOSE, CAPILLARY: Glucose-Capillary: 89 mg/dL (ref 70–99)

## 2022-01-15 MED ORDER — FLUDEOXYGLUCOSE F - 18 (FDG) INJECTION
8.5000 | Freq: Once | INTRAVENOUS | Status: AC | PRN
  Administered 2022-01-15: 8.9 via INTRAVENOUS

## 2022-01-19 ENCOUNTER — Telehealth: Payer: Self-pay | Admitting: Internal Medicine

## 2022-01-19 NOTE — Telephone Encounter (Signed)
I called the patient and spoke to him personally about the result of the PET scan and the concern about significant cholecystitis and gallbladder stones.  He is symptomatic with fever as well as pain in the right upper quadrant.  I strongly advised him to go immediately to the emergency department for management and surgical evaluation.

## 2022-01-20 ENCOUNTER — Inpatient Hospital Stay (HOSPITAL_COMMUNITY)
Admission: EM | Admit: 2022-01-20 | Discharge: 2022-01-24 | DRG: 444 | Disposition: A | Discharge status: Home / Self Care | Payer: Medicare Other | Attending: Internal Medicine | Admitting: Internal Medicine

## 2022-01-20 ENCOUNTER — Inpatient Hospital Stay (HOSPITAL_COMMUNITY): Payer: Medicare Other

## 2022-01-20 ENCOUNTER — Emergency Department (HOSPITAL_COMMUNITY): Payer: Medicare Other

## 2022-01-20 ENCOUNTER — Encounter (HOSPITAL_COMMUNITY): Payer: Self-pay | Admitting: Emergency Medicine

## 2022-01-20 ENCOUNTER — Other Ambulatory Visit: Payer: Self-pay

## 2022-01-20 DIAGNOSIS — Z85828 Personal history of other malignant neoplasm of skin: Secondary | ICD-10-CM | POA: Diagnosis not present

## 2022-01-20 DIAGNOSIS — G4733 Obstructive sleep apnea (adult) (pediatric): Secondary | ICD-10-CM | POA: Diagnosis not present

## 2022-01-20 DIAGNOSIS — J449 Chronic obstructive pulmonary disease, unspecified: Secondary | ICD-10-CM | POA: Diagnosis present

## 2022-01-20 DIAGNOSIS — Z79899 Other long term (current) drug therapy: Secondary | ICD-10-CM

## 2022-01-20 DIAGNOSIS — E039 Hypothyroidism, unspecified: Secondary | ICD-10-CM | POA: Diagnosis present

## 2022-01-20 DIAGNOSIS — E785 Hyperlipidemia, unspecified: Secondary | ICD-10-CM | POA: Diagnosis present

## 2022-01-20 DIAGNOSIS — Z9842 Cataract extraction status, left eye: Secondary | ICD-10-CM

## 2022-01-20 DIAGNOSIS — R823 Hemoglobinuria: Secondary | ICD-10-CM | POA: Diagnosis present

## 2022-01-20 DIAGNOSIS — C9110 Chronic lymphocytic leukemia of B-cell type not having achieved remission: Secondary | ICD-10-CM | POA: Diagnosis present

## 2022-01-20 DIAGNOSIS — I5189 Other ill-defined heart diseases: Secondary | ICD-10-CM

## 2022-01-20 DIAGNOSIS — B9620 Unspecified Escherichia coli [E. coli] as the cause of diseases classified elsewhere: Secondary | ICD-10-CM | POA: Diagnosis not present

## 2022-01-20 DIAGNOSIS — C3490 Malignant neoplasm of unspecified part of unspecified bronchus or lung: Secondary | ICD-10-CM | POA: Diagnosis not present

## 2022-01-20 DIAGNOSIS — F1011 Alcohol abuse, in remission: Secondary | ICD-10-CM | POA: Diagnosis present

## 2022-01-20 DIAGNOSIS — F32A Depression, unspecified: Secondary | ICD-10-CM | POA: Diagnosis present

## 2022-01-20 DIAGNOSIS — R7401 Elevation of levels of liver transaminase levels: Secondary | ICD-10-CM | POA: Diagnosis present

## 2022-01-20 DIAGNOSIS — I10 Essential (primary) hypertension: Secondary | ICD-10-CM | POA: Diagnosis not present

## 2022-01-20 DIAGNOSIS — Z87891 Personal history of nicotine dependence: Secondary | ICD-10-CM | POA: Diagnosis not present

## 2022-01-20 DIAGNOSIS — E43 Unspecified severe protein-calorie malnutrition: Secondary | ICD-10-CM | POA: Insufficient documentation

## 2022-01-20 DIAGNOSIS — Z8249 Family history of ischemic heart disease and other diseases of the circulatory system: Secondary | ICD-10-CM

## 2022-01-20 DIAGNOSIS — Z9981 Dependence on supplemental oxygen: Secondary | ICD-10-CM | POA: Diagnosis not present

## 2022-01-20 DIAGNOSIS — Z6824 Body mass index (BMI) 24.0-24.9, adult: Secondary | ICD-10-CM | POA: Diagnosis not present

## 2022-01-20 DIAGNOSIS — I4892 Unspecified atrial flutter: Secondary | ICD-10-CM | POA: Diagnosis present

## 2022-01-20 DIAGNOSIS — K82A1 Gangrene of gallbladder in cholecystitis: Secondary | ICD-10-CM | POA: Diagnosis present

## 2022-01-20 DIAGNOSIS — Z833 Family history of diabetes mellitus: Secondary | ICD-10-CM

## 2022-01-20 DIAGNOSIS — K8000 Calculus of gallbladder with acute cholecystitis without obstruction: Secondary | ICD-10-CM | POA: Diagnosis not present

## 2022-01-20 DIAGNOSIS — K219 Gastro-esophageal reflux disease without esophagitis: Secondary | ICD-10-CM | POA: Diagnosis not present

## 2022-01-20 DIAGNOSIS — R059 Cough, unspecified: Secondary | ICD-10-CM | POA: Diagnosis not present

## 2022-01-20 DIAGNOSIS — Z7901 Long term (current) use of anticoagulants: Secondary | ICD-10-CM

## 2022-01-20 DIAGNOSIS — Z79890 Hormone replacement therapy (postmenopausal): Secondary | ICD-10-CM

## 2022-01-20 DIAGNOSIS — I480 Paroxysmal atrial fibrillation: Secondary | ICD-10-CM | POA: Diagnosis not present

## 2022-01-20 DIAGNOSIS — K5900 Constipation, unspecified: Secondary | ICD-10-CM | POA: Diagnosis present

## 2022-01-20 DIAGNOSIS — R1011 Right upper quadrant pain: Secondary | ICD-10-CM | POA: Diagnosis not present

## 2022-01-20 DIAGNOSIS — K8020 Calculus of gallbladder without cholecystitis without obstruction: Secondary | ICD-10-CM | POA: Diagnosis not present

## 2022-01-20 DIAGNOSIS — F411 Generalized anxiety disorder: Secondary | ICD-10-CM | POA: Diagnosis present

## 2022-01-20 DIAGNOSIS — K8013 Calculus of gallbladder with acute and chronic cholecystitis with obstruction: Principal | ICD-10-CM | POA: Diagnosis present

## 2022-01-20 DIAGNOSIS — J439 Emphysema, unspecified: Secondary | ICD-10-CM | POA: Diagnosis not present

## 2022-01-20 DIAGNOSIS — Z825 Family history of asthma and other chronic lower respiratory diseases: Secondary | ICD-10-CM

## 2022-01-20 DIAGNOSIS — R935 Abnormal findings on diagnostic imaging of other abdominal regions, including retroperitoneum: Secondary | ICD-10-CM | POA: Diagnosis not present

## 2022-01-20 DIAGNOSIS — Z803 Family history of malignant neoplasm of breast: Secondary | ICD-10-CM

## 2022-01-20 DIAGNOSIS — K82A2 Perforation of gallbladder in cholecystitis: Secondary | ICD-10-CM | POA: Diagnosis present

## 2022-01-20 DIAGNOSIS — R0600 Dyspnea, unspecified: Secondary | ICD-10-CM | POA: Diagnosis not present

## 2022-01-20 DIAGNOSIS — K810 Acute cholecystitis: Secondary | ICD-10-CM | POA: Diagnosis not present

## 2022-01-20 DIAGNOSIS — K8050 Calculus of bile duct without cholangitis or cholecystitis without obstruction: Secondary | ICD-10-CM | POA: Diagnosis not present

## 2022-01-20 HISTORY — DX: Other ill-defined heart diseases: I51.89

## 2022-01-20 LAB — URINALYSIS, ROUTINE W REFLEX MICROSCOPIC
Bilirubin Urine: NEGATIVE
Glucose, UA: NEGATIVE mg/dL
Ketones, ur: NEGATIVE mg/dL
Leukocytes,Ua: NEGATIVE
Nitrite: NEGATIVE
Protein, ur: NEGATIVE mg/dL
Specific Gravity, Urine: 1.011 (ref 1.005–1.030)
pH: 5 (ref 5.0–8.0)

## 2022-01-20 LAB — CBC WITH DIFFERENTIAL/PLATELET
Abs Immature Granulocytes: 0.19 10*3/uL — ABNORMAL HIGH (ref 0.00–0.07)
Basophils Absolute: 0.1 10*3/uL (ref 0.0–0.1)
Basophils Relative: 1 %
Eosinophils Absolute: 0.1 10*3/uL (ref 0.0–0.5)
Eosinophils Relative: 1 %
HCT: 39.2 % (ref 39.0–52.0)
Hemoglobin: 12.7 g/dL — ABNORMAL LOW (ref 13.0–17.0)
Immature Granulocytes: 2 %
Lymphocytes Relative: 15 %
Lymphs Abs: 1.7 10*3/uL (ref 0.7–4.0)
MCH: 29.7 pg (ref 26.0–34.0)
MCHC: 32.4 g/dL (ref 30.0–36.0)
MCV: 91.6 fL (ref 80.0–100.0)
Monocytes Absolute: 0.8 10*3/uL (ref 0.1–1.0)
Monocytes Relative: 8 %
Neutro Abs: 8.1 10*3/uL — ABNORMAL HIGH (ref 1.7–7.7)
Neutrophils Relative %: 73 %
Platelets: 318 10*3/uL (ref 150–400)
RBC: 4.28 MIL/uL (ref 4.22–5.81)
RDW: 15.5 % (ref 11.5–15.5)
WBC: 10.9 10*3/uL — ABNORMAL HIGH (ref 4.0–10.5)
nRBC: 0 % (ref 0.0–0.2)

## 2022-01-20 LAB — PHOSPHORUS: Phosphorus: 3.3 mg/dL (ref 2.5–4.6)

## 2022-01-20 LAB — PROTIME-INR
INR: 1.3 — ABNORMAL HIGH (ref 0.8–1.2)
Prothrombin Time: 16.3 seconds — ABNORMAL HIGH (ref 11.4–15.2)

## 2022-01-20 LAB — MAGNESIUM: Magnesium: 2.1 mg/dL (ref 1.7–2.4)

## 2022-01-20 LAB — COMPREHENSIVE METABOLIC PANEL
ALT: 64 U/L — ABNORMAL HIGH (ref 0–44)
AST: 43 U/L — ABNORMAL HIGH (ref 15–41)
Albumin: 2.7 g/dL — ABNORMAL LOW (ref 3.5–5.0)
Alkaline Phosphatase: 159 U/L — ABNORMAL HIGH (ref 38–126)
Anion gap: 6 (ref 5–15)
BUN: 9 mg/dL (ref 8–23)
CO2: 28 mmol/L (ref 22–32)
Calcium: 8.8 mg/dL — ABNORMAL LOW (ref 8.9–10.3)
Chloride: 103 mmol/L (ref 98–111)
Creatinine, Ser: 0.72 mg/dL (ref 0.61–1.24)
GFR, Estimated: >60 mL/min (ref >60)
Glucose, Bld: 103 mg/dL — ABNORMAL HIGH (ref 70–99)
Potassium: 3.5 mmol/L (ref 3.5–5.1)
Sodium: 137 mmol/L (ref 135–145)
Total Bilirubin: 2 mg/dL — ABNORMAL HIGH (ref 0.3–1.2)
Total Protein: 6.9 g/dL (ref 6.5–8.1)

## 2022-01-20 LAB — LACTIC ACID, PLASMA
Lactic Acid, Venous: 0.8 mmol/L (ref 0.5–1.9)
Lactic Acid, Venous: 0.8 mmol/L (ref 0.5–1.9)

## 2022-01-20 LAB — LIPASE, BLOOD: Lipase: 73 U/L — ABNORMAL HIGH (ref 11–51)

## 2022-01-20 MED ORDER — MORPHINE SULFATE (PF) 2 MG/ML IV SOLN: 2.0000 mg | INTRAVENOUS | Status: DC | PRN

## 2022-01-20 MED ORDER — ATENOLOL 25 MG PO TABS: 25.0000 mg | ORAL_TABLET | Freq: Every day | ORAL | Status: DC | PRN

## 2022-01-20 MED ORDER — POTASSIUM CHLORIDE IN NACL 20-0.9 MEQ/L-% IV SOLN
INTRAVENOUS | Status: AC
  Filled 2022-01-20 (×2): qty 1000

## 2022-01-20 MED ORDER — POLYVINYL ALCOHOL 1.4 % OP SOLN: 1.0000 [drp] | OPHTHALMIC | Status: DC | PRN

## 2022-01-20 MED ORDER — GADOBUTROL 1 MMOL/ML IV SOLN
8.0000 mL | Freq: Once | INTRAVENOUS | Status: AC | PRN
  Administered 2022-01-20: 8 mL via INTRAVENOUS

## 2022-01-20 MED ORDER — PIPERACILLIN-TAZOBACTAM 3.375 G IVPB 30 MIN
3.3750 g | Freq: Three times a day (TID) | INTRAVENOUS | Status: AC
  Administered 2022-01-20 – 2022-01-22 (×6): 3.375 g via INTRAVENOUS
  Filled 2022-01-20 (×11): qty 50

## 2022-01-20 MED ORDER — ONDANSETRON HCL 4 MG/2ML IJ SOLN: 4.0000 mg | Freq: Four times a day (QID) | INTRAMUSCULAR | Status: DC | PRN

## 2022-01-20 MED ORDER — PIPERACILLIN-TAZOBACTAM 3.375 G IVPB 30 MIN
3.3750 g | Freq: Once | INTRAVENOUS | Status: AC
  Administered 2022-01-20: 3.375 g via INTRAVENOUS
  Filled 2022-01-20: qty 50

## 2022-01-20 MED ORDER — POLYETHYL GLYCOL-PROPYL GLYCOL 0.4-0.3 % OP SOLN: Freq: Two times a day (BID) | OPHTHALMIC | Status: DC | PRN

## 2022-01-20 MED ORDER — LEVOTHYROXINE SODIUM 112 MCG PO TABS
112.0000 ug | ORAL_TABLET | Freq: Every day | ORAL | Status: DC
  Administered 2022-01-22 – 2022-01-24 (×3): 112 ug via ORAL
  Filled 2022-01-20 (×4): qty 1

## 2022-01-20 MED ORDER — DIAZEPAM 5 MG PO TABS: 5.0000 mg | ORAL_TABLET | Freq: Every evening | ORAL | Status: DC | PRN

## 2022-01-20 MED ORDER — ONDANSETRON HCL 4 MG PO TABS: 4.0000 mg | ORAL_TABLET | Freq: Four times a day (QID) | ORAL | Status: DC | PRN

## 2022-01-20 MED ORDER — AMLODIPINE BESYLATE 5 MG PO TABS
5.0000 mg | ORAL_TABLET | Freq: Every day | ORAL | Status: DC
  Administered 2022-01-20 – 2022-01-22 (×3): 5 mg via ORAL
  Filled 2022-01-20 (×4): qty 1

## 2022-01-20 NOTE — Progress Notes (Signed)
PT refused CPAP.  

## 2022-01-20 NOTE — Consult Note (Addendum)
Joe [name] 01/21/1946  [id number].    Requesting MD: Long, MD Chief Complaint/Reason for Consult: cholecystitis   HPI:  Joe [name] is a 76 y/o M with multiple medical problems including, but not limited to, COPD on home O2, stage IIIb NSCLC, CLL, hypothyroidism, and afib on Eliquis who presented to the hospital with abdominal pain and fever. He tells me that on Saturday 11/14 he took two tylenol before bed and then had severe RUQ abdominal pain that was constant. Described as irritating his diaphragm and also under his sternum. He did not sleep well that night and continued to feel bad the next day. For over one week he reports eating about 1 dole fruit cup daily and drinking over 54oz of water daily. Has been unable to eat more due to abdominal pain - mostly in the upper abdomen but occasionally in other places. He also reports fevers of roughly 101-102 and dark urine, despite drinking even more water than usual. He has been mostly laying in bed due to these symptoms. He denies nausea, vomiting, diarrhea, constipation, abnormal color of his stool or urinary sxs. Denies similar pain in the past. He was able to go to his PET scan on Friday 10/27, ordered by his oncologist Dr. Julien Nordmann for monitoring of his NSCLC. Last night Dr. Julien Nordmann called him and told him to go to the ED because his pet scan showed cholecystitis. The patient presented to the ED this morning. He tells me that the last couple of days he has been afebrile and his pain has been slightly better, but still has RUQ pain.   He is a former smoker, says he quit around 05/18/12. He takes eliquis and he last took it this morning. He denies a history of abdominal surgery. He lives alone and is independent of ADLs - drove himself to the ED.   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   chronic stage 1- no symtoms   CLL (chronic lymphocytic leukemia) ([place]) 08/08/2014   05/18/2014 Dr Burr Medico Stage 0   COPD mixed type ([place]) 07/26/2007   Smoker - stopped 6/14    De Quervain's tenosynovitis, right 12-26-13   05/18/13    Depression    at times   Dysrhythmia    PAF   Dysuria 12/26/13   9/15 - poss stricture Urol ref was offered    Gallstones 11/16/2017   Asymptomatic Pt refused surg ref   Generalized anxiety disorder 09/07/2012   Chronic   Potential benefits of a long term steroid  use as well as potential risks  and complications were explained to the patient and were aknowledged.      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   PERIORBITAL CELLULITIS 02/22/2009   Qualifier: Diagnosis of  By: Diona Browner MD, Amy     Retinal detachment    L>>R    Past Surgical History:  Procedure Laterality Date   BICEPS TENDON REPAIR     BRONCHIAL BIOPSY  10/23/2019   Procedure: BRONCHIAL BIOPSIES;  Surgeon: [name], MD;  Location: [facility] ENDOSCOPY;  Service: Pulmonary;;   BRONCHIAL BIOPSY  11/20/2019   Procedure: BRONCHIAL BIOPSIES;  Surgeon: [name], MD;  Location: [facility] ENDOSCOPY;  Service: Pulmonary;;   BRONCHIAL BRUSHINGS  10/23/2019   Procedure: BRONCHIAL BRUSHINGS;  Surgeon: [name], MD;  Location: [facility] ENDOSCOPY;  Service: Pulmonary;;   BRONCHIAL BRUSHINGS  11/20/2019   Procedure:  BRONCHIAL BRUSHINGS;  Surgeon: [name], MD;  Location: [facility] ENDOSCOPY;  Service: Pulmonary;;   BRONCHIAL NEEDLE ASPIRATION BIOPSY  10/23/2019   Procedure: BRONCHIAL NEEDLE ASPIRATION BIOPSIES;  Surgeon: [name], MD;  Location: [facility] ENDOSCOPY;  Service: Pulmonary;;   BRONCHIAL NEEDLE ASPIRATION BIOPSY  11/20/2019   Procedure: BRONCHIAL NEEDLE ASPIRATION BIOPSIES;  Surgeon: [name], MD;  Location: [facility] ENDOSCOPY;  Service: Pulmonary;;   BRONCHIAL WASHINGS  11/20/2019   Procedure: BRONCHIAL WASHINGS;  Surgeon: [name], MD;  Location: [facility] ENDOSCOPY;  Service: Pulmonary;;   CATARACT EXTRACTION Left    COLONOSCOPY  04-14-99   Dr Flossie Dibble polyp-TA in epic   POLYPECTOMY  04-14-99   POSTERIOR LAMINECTOMY / DECOMPRESSION CERVICAL SPINE     Dr Saintclair Halsted   RETINAL DETACHMENT SURGERY     left eye, 2007 x2, 2008 x 3   ROTATOR CUFF REPAIR  2004   right   TONSILLECTOMY  [id number]   VIDEO BRONCHOSCOPY WITH ENDOBRONCHIAL NAVIGATION N/A 10/23/2019   Procedure: VIDEO BRONCHOSCOPY WITH ENDOBRONCHIAL NAVIGATION;  Surgeon: [name], MD;  Location: [facility] [place] ENDOSCOPY;  Service: Pulmonary;  Laterality: N/A;   VIDEO BRONCHOSCOPY WITH ENDOBRONCHIAL NAVIGATION N/A 11/20/2019   Procedure: VIDEO BRONCHOSCOPY WITH ENDOBRONCHIAL NAVIGATION;  Surgeon: [name], MD;  Location: [facility] ENDOSCOPY;  Service: Pulmonary;  Laterality: N/A;   VIDEO BRONCHOSCOPY WITH ENDOBRONCHIAL ULTRASOUND N/A 10/23/2019   Procedure: VIDEO BRONCHOSCOPY WITH ENDOBRONCHIAL ULTRASOUND;  Surgeon: [name], MD;  Location: [place] ENDOSCOPY;  Service: Pulmonary;  Laterality: N/A;    Social History:  reports that he quit smoking about 9 years ago. His smoking use included cigarettes. He has a 40.00 pack-year smoking history. He has never used smokeless tobacco. He reports that he does not currently use alcohol. He reports that he does not use drugs.  Allergies: No Known Allergies  (Not in a hospital admission)    Physical  Exam: Blood pressure 112/71, pulse 60, temperature 98.2 F (36.8 C), resp. rate 18, SpO2 100 %. General: Pleasant white male laying on hospital bed, appears stated age, NAD. HEENT: head -normocephalic, atraumatic; Eyes: PERRLA, anicteric sclerae Neck- Trachea is midline CV- RRR, normal S1/S2, no M/R/G, radial and dorsalis pedis pulses 2+ BL, cap refill < 2 seconds. Pulm- breathing is slightly labored on [place] Abd- soft, there is a palpable gallbladder in the RUQ with overlying tenderness, without guarding or peritonitis, mild epigastric tenderness as well. No hernias. No scars.  GU- deferred  MSK- UE/LE symmetrical, no cyanosis, clubbing, or edema. Neuro- nonfocal exam,  steady gait. Psych- Alert and Oriented x3 with appropriate affect Skin: warm and dry, no rashes or lesions   Results for orders placed or performed during the hospital encounter of 01/20/22 (from the past 48 hour(s))  Comprehensive metabolic panel     Status: Abnormal   Collection Time: 01/20/22 10:35 AM  Result Value Ref Range   Sodium 137 135 - 145 mmol/L   Potassium 3.5 3.5 - 5.1 mmol/L   Chloride 103 98 - 111 mmol/L   CO2 28 22 - 32 mmol/L   Glucose, Bld 103 (H) 70 - 99 mg/dL    Comment: Glucose reference range applies only to samples taken after fasting for at least 8 hours.   BUN 9 8 - 23 mg/dL   Creatinine, Ser 0.72 0.61 - 1.24 mg/dL   Calcium 8.8 (L) 8.9 - 10.3 mg/dL   Total Protein 6.9 6.5 - 8.1 g/dL   Albumin 2.7 (L) 3.5 - 5.0 g/dL   AST 43 (H) 15 - 41 U/L   ALT 64 (H) 0 - 44 U/L   Alkaline Phosphatase 159 (H) 38 - 126 U/L   Total Bilirubin 2.0 (H) 0.3 - 1.2 mg/dL   GFR, Estimated >60 >60 mL/min    Comment: (NOTE) Calculated using the CKD-EPI Creatinine Equation (2021)    Anion gap 6 5 - 15    Comment: Performed at [facility], [place] [address], [place], [place] [address]  Lipase, blood     Status: Abnormal   Collection Time: 01/20/22 10:35 AM  Result Value Ref Range   Lipase 73 (H)  11 - 51 U/L    Comment: Performed at [facility], [place] [address] [place], [place] [address]  CBC with Differential     Status: Abnormal   Collection Time: 01/20/22 10:35 AM  Result Value Ref Range   WBC 10.9 (H) 4.0 - 10.5 K/uL   RBC 4.28 4.22 - 5.81 MIL/uL   Hemoglobin 12.7 (L) 13.0 - 17.0 g/dL   HCT 39.2 39.0 - 52.0 %   MCV 91.6 80.0 - 100.0 fL   MCH 29.7 26.0 - 34.0 pg   MCHC 32.4 30.0 - 36.0 g/dL   RDW 15.5 11.5 - 15.5 %   Platelets 318 150 - 400 K/uL   nRBC 0.0 0.0 - 0.2 %   Neutrophils Relative % 73 %   Neutro Abs 8.1 (H) 1.7 - 7.7 K/uL   Lymphocytes Relative 15 %   Lymphs Abs 1.7 0.7 - 4.0 K/uL   Monocytes Relative 8 %   Monocytes Absolute 0.8 0.1 - 1.0 K/uL   Eosinophils Relative 1 %   Eosinophils Absolute 0.1 0.0 - 0.5 K/uL   Basophils Relative 1 %   Basophils Absolute 0.1 0.0 - 0.1 K/uL   Immature Granulocytes 2 %   Abs Immature Granulocytes 0.19 (H) 0.00 - 0.07 K/uL    Comment: Performed at [facility], [place] [address], [place], [place] [address]  Lactic acid, plasma     Status: None   Collection Time: 01/20/22 10:35 AM  Result Value Ref Range   Lactic Acid, Venous 0.8 0.5 - 1.9 mmol/L    Comment: Performed at [facility], [place] [address], [place], [place] [address]  Urinalysis, Routine w reflex microscopic Urine, Clean Catch     Status: Abnormal   Collection Time: 01/20/22 11:40 AM  Result Value Ref Range   Color, Urine AMBER (A) YELLOW    Comment: BIOCHEMICALS MAY BE  AFFECTED BY COLOR   APPearance CLEAR CLEAR   Specific Gravity, Urine 1.011 1.005 - 1.030   pH 5.0 5.0 - 8.0   Glucose, UA NEGATIVE NEGATIVE mg/dL   Hgb urine dipstick SMALL (A) NEGATIVE   Bilirubin Urine NEGATIVE NEGATIVE   Ketones, ur NEGATIVE NEGATIVE mg/dL   Protein, ur NEGATIVE NEGATIVE mg/dL   Nitrite NEGATIVE NEGATIVE   Leukocytes,Ua NEGATIVE NEGATIVE   RBC / HPF 6-10 0 - 5 RBC/hpf   WBC, UA 6-10 0 - 5 WBC/hpf   Bacteria, UA  RARE (A) NONE SEEN   Squamous Epithelial / LPF 0-5 0 - 5   Mucus PRESENT    Hyaline Casts, UA PRESENT     Comment: Performed at [facility], [place] [address], [place], [place] [address]  Lactic acid, plasma     Status: None   Collection Time: 01/20/22 12:18 PM  Result Value Ref Range   Lactic Acid, Venous 0.8 0.5 - 1.9 mmol/L    Comment: Performed at [facility], [place] [address], [place], [place] [address]   US Abdomen Limited RUQ (LIVER/GB)  Result Date: 01/20/2022 CLINICAL DATA:  Right upper quadrant pain. Recent abnormal appearance on PET EXAM: ULTRASOUND ABDOMEN LIMITED RIGHT UPPER QUADRANT COMPARISON:  PET CT 01/15/22 FINDINGS: Gallbladder: Gallbladder wall is markedly thickened measuring up to 1.1 cm. There is mild hyperemia surrounding the gallbladder. There are portions of the gallbladder wall that are difficult to visualized (series 1, image 14, 28, 29). There are multiple layering gallstones. Sonographic Murphy sign was negative per patient. Common bile duct: Diameter: 6 mm Liver: No focal lesion identified. Slightly increased parenchymal echogenicity. Portal vein is patent on color Doppler imaging with normal direction of blood flow towards the liver. Other: None. IMPRESSION: Cholelithiasis with markedly thickened gallbladder wall, compatible with severe acute cholecystitis. Portions of the gallbladder wall are difficult to visualize, which could suggest gangrenous cholecystitis. Electronically Signed   By: Marin Roberts M.D.   On: 01/20/2022 11:56     Assessment/Plan Acute calculous cholecystitis  - afebrile, VSS in ED but with self-reported history over fever over 101 for a week.  - WBC 10.9; LFTs are acutely elevated -- AST 43, ALT 64, Alk phos 159, Bili 2.0  - RUQ U/S from today  with cholelithiasis, GB wall thickening, and mention of possible gangrenous cholecystitis because of GB wall hypoenhancement in some areas. PET scan from 10/27 with  gallbladder wall thickening up to 1.1 cm, inflammatory fat stranding, and stones consistent with severe acute cholecystitis. - based on history, labs, imaging, and exam this patient has severe cholecystitis. I recommend IR consult for percutaneous cholecystostomy tube. His age and comorbidities make him a high risk operative candidate and, since he has had over 2 weeks of symptoms/pain, surgery would likely be technically more difficult and his risk for other unfavorable outcomes like subtotal cholecystectomy, conversion to open, or damage to surrounding structures, would be higher as well.  - also recommend MRCP to rule out choledocholithiasis given hyperbilirubinemia and dark urine.    FEN - NPO, if no procedure planned for today then ok for CLD VTE - SCD's, hold Eliquis and DVT ppx until seen by IR. ID - Zosyn Admit - to [facility] service   COPD on home O2 Stage IIIb non small cell lung CA OSA HLD Hypothyroidism  A.fib on eliquis - hold eliquis   I reviewed nursing notes, ED provider notes, last 24 h vitals and pain scores, last 48 h intake  and output, last 24 h labs and trends, and last 24 h imaging results.  Jill Alexanders, [facility] 01/20/2022, 2:42 PM Please see Amion for pager number during day hours 7:00am-4:30pm or 7:00am -11:30am on weekends

## 2022-01-20 NOTE — Progress Notes (Signed)
Introduced myself and did nursing admission history with patient. He willingly answered questions. 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 I told him if he has any of these needs then we can get a case manager to help him. I told him that if he decides he does not want to answer them that he can say so and I will document that and not ask him any more of the questions.  He willingly answered the questions and then he said "This really lights me up! Since you are being so personal you can do a background check on me if you need to.!" I told him that I had told him before I asked them if he did not want to answer them he did not have to. He said "I know. My apologies for being so blunt." Doroteo Bradford BSN, RN-BC Throughput Nurse 01/20/2022 5:08 PM

## 2022-01-20 NOTE — Progress Notes (Signed)
Attempted to do nursing admission history. Staff currently working with pt. Unable to compelte nursing admission history at this time. Doroteo Bradford BSN, RN-BC Throughput Nurse 01/20/2022 3:32 PM

## 2022-01-20 NOTE — Consult Note (Signed)
Chief Complaint: Patient was seen in consultation today for  Chief Complaint  Patient presents with   Gallbladder Infection    at the request of General Surgery  Referring Physician(s): Obie Dredge, PA-C  Supervising Physician: Ruthann Cancer  Patient Status: [facility] - In-pt  History of Present Illness: Joe [name] is a 76 y.o. male with multiple medical problems including, but not limited to, COPD on home O2, stage IIIb NSCLC, CLL, hypothyroidism, and afib on Eliquis who presented to the hospital with abdominal pain.  He reports 2 week history of abdominal discomfort which is experienced currently as tightness in the right upper quadrant.  He endorses fever, low energy, bowel or urinary symptoms.  He denies CP, increase SOB, chest pain or palpitations.  He reports requiring his cane the past couple weeks to get around the house, where previously, he only used it for longer distances.  He had a scheduled PET last week and received call from oncologist Dr. 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   chronic stage 1- no symtoms   CLL (chronic lymphocytic leukemia) ([place]) 08/08/2014   06/14/14 Dr Burr Medico Stage 0   COPD mixed type ([place]) 07/26/2007   Smoker - stopped 6/14    De Quervain's tenosynovitis, right 12/28/2013   06/13/13    Depression    at times   Dysrhythmia    PAF   Dysuria 12/28/13   9/15 - poss stricture Urol ref was offered    Gallstones 11/16/2017   Asymptomatic Pt refused surg ref   Generalized anxiety disorder 09/07/2012   Chronic   Potential benefits of a long term steroid  use as well as potential risks  and complications were explained to the patient and were aknowledged.      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   PERIORBITAL CELLULITIS 02/22/2009   Qualifier: Diagnosis of  By: Diona Browner MD, Amy     Retinal detachment    L>>R    Past Surgical History:  Procedure Laterality Date   BICEPS TENDON REPAIR     BRONCHIAL BIOPSY  10/23/2019   Procedure: BRONCHIAL BIOPSIES;  Surgeon: [name], MD;  Location: [place];  Service: Pulmonary;;   BRONCHIAL BIOPSY  11/20/2019   Procedure: BRONCHIAL BIOPSIES;  Surgeon: [name], MD;  Location: [facility] ENDOSCOPY;  Service: Pulmonary;;   BRONCHIAL BRUSHINGS  10/23/2019   Procedure: BRONCHIAL BRUSHINGS;  Surgeon: [name], MD;  Location: [facility] ENDOSCOPY;  Service: Pulmonary;;   BRONCHIAL BRUSHINGS  11/20/2019   Procedure: BRONCHIAL BRUSHINGS;  Surgeon: [name], MD;  Location: [place];  Service: Pulmonary;;   BRONCHIAL NEEDLE ASPIRATION BIOPSY  10/23/2019  Procedure: BRONCHIAL NEEDLE ASPIRATION BIOPSIES;  Surgeon: [name], MD;  Location: [facility] ENDOSCOPY;  Service: Pulmonary;;   BRONCHIAL NEEDLE ASPIRATION BIOPSY  11/20/2019   Procedure: BRONCHIAL NEEDLE ASPIRATION BIOPSIES;  Surgeon: [name], MD;  Location: [place];  Service: Pulmonary;;   BRONCHIAL WASHINGS  11/20/2019   Procedure: BRONCHIAL WASHINGS;  Surgeon: [name], MD;  Location: [place];  Service: Pulmonary;;   CATARACT EXTRACTION Left    COLONOSCOPY  04-14-99   Dr Flossie Dibble polyp-TA in epic   POLYPECTOMY  04-14-99   POSTERIOR LAMINECTOMY / DECOMPRESSION CERVICAL SPINE     Dr Saintclair Halsted   RETINAL DETACHMENT SURGERY     left eye, 2007 x2, 2008 x 3   ROTATOR CUFF REPAIR  2004   right    TONSILLECTOMY  [phone]   VIDEO BRONCHOSCOPY WITH ENDOBRONCHIAL NAVIGATION N/A 10/23/2019   Procedure: VIDEO BRONCHOSCOPY WITH ENDOBRONCHIAL NAVIGATION;  Surgeon: [name], MD;  Location: [place] ENDOSCOPY;  Service: Pulmonary;  Laterality: N/A;   VIDEO BRONCHOSCOPY WITH ENDOBRONCHIAL NAVIGATION N/A 11/20/2019   Procedure: VIDEO BRONCHOSCOPY WITH ENDOBRONCHIAL NAVIGATION;  Surgeon: [name], MD;  Location: [place] ENDOSCOPY;  Service: Pulmonary;  Laterality: N/A;   VIDEO BRONCHOSCOPY WITH ENDOBRONCHIAL ULTRASOUND N/A 10/23/2019   Procedure: VIDEO BRONCHOSCOPY WITH ENDOBRONCHIAL ULTRASOUND;  Surgeon: [name], MD;  Location: [place] ENDOSCOPY;  Service: Pulmonary;  Laterality: N/A;    Allergies: Patient has no known allergies.  Medications: Prior to Admission medications   Medication Sig Start Date End Date Taking? Authorizing Provider  albuterol (PROVENTIL) (2.5 MG/3ML) 0.083% nebulizer solution Take 3 mLs (2.5 mg total) by nebulization every 6 (six) hours as needed for wheezing or shortness of breath. 03/04/21  Yes Maryjane Hurter, MD  amLODipine (NORVASC) 5 MG tablet TAKE 1 TABLET BY MOUTH EVERY DAY Patient taking differently: Take 5 mg by mouth daily. 11/09/21  Yes [name], [name], MD  apixaban (ELIQUIS) 5 MG TABS tablet TAKE 1 TABLET(5 MG) BY MOUTH TWICE DAILY Patient taking differently: Take 5 mg by mouth 2 (two) times daily. 09/25/21  Yes Turner, Eber Hong, MD  atenolol (TENORMIN) 25 MG tablet Take 1 tablet (25 mg total) by mouth daily as needed (palpitations). Patient taking differently: Take 25 mg by mouth daily as needed (prolonged tachycardia). 07/29/21  Yes [name], [name], MD  Cholecalciferol 25 MCG (1000 UT) tablet Take 1,000 Units by mouth daily.   Yes [provider]  clotrimazole-betamethasone (LOTRISONE) cream Apply topically 2 (two) times daily. Patient taking differently: Apply 1 Application topically 2 (two) times daily as needed (nail fungus). 12/24/20   Yes [name], [name], MD  diazepam (VALIUM) 5 MG tablet Take 1 tablet (5 mg total) by mouth every 12 (twelve) hours as needed for anxiety. Patient taking differently: Take 5 mg by mouth every 12 (twelve) hours as needed for anxiety (sleep). 07/29/21  Yes [name], [name], MD  levothyroxine (SYNTHROID) 112 MCG tablet TAKE 1 TABLET(112 MCG) BY MOUTH DAILY Patient taking differently: Take 112 mcg by mouth daily. 11/09/21  Yes Heilingoetter, Cassandra L, PA-C  lovastatin (MEVACOR) 20 MG tablet TAKE 1 TABLET BY MOUTH EVERY NIGHT AT BEDTIME Patient taking differently: Take 20 mg by mouth at bedtime. 08/03/21  Yes [name], [name], MD  Multiple Vitamin (MULTIVITAMIN) tablet Take 1 tablet by mouth daily. Centrum Silver.   Yes [provider]  Polyethyl Glycol-Propyl Glycol (SYSTANE ULTRA OP) Place 1 drop into both eyes 2 (two) times daily as needed (dry eyes).  Yes [provider]     Family History  Problem Relation Age of Onset   COPD Mother    Diabetes Father    Coronary artery disease Other    Breast cancer Paternal Aunt 46   Colon cancer Neg Hx     Social History   Socioeconomic History   Marital status: Widowed    Spouse name: Not on file   Number of children: 1   Years of education: Not on file   Highest education level: Not on file  Occupational History   Occupation: retired  Tobacco Use   Smoking status: Former    Packs/day: 0.80    Years: 50.00    Total pack years: 40.00    Types: Cigarettes    Quit date: 08/27/2012    Years since quitting: 9.4   Smokeless tobacco: Never  Vaping Use   Vaping Use: Never used  Substance and Sexual Activity   Alcohol use: Not Currently   Drug use: No   Sexual activity: Not Currently  Other Topics Concern   Not on file  Social History Narrative   Not on file   Social Determinants of Health   Financial Resource Strain: Low Risk  (09/23/2021)   Overall Financial Resource Strain (CARDIA)    Difficulty of Paying  Living Expenses: Not hard at all  Food Insecurity: No Food Insecurity (09/23/2021)   Hunger Vital Sign    Worried About Running Out of Food in the Last Year: Never true    [place] in the Last Year: Never true  Transportation Needs: No Transportation Needs (09/23/2021)   PRAPARE - Hydrologist (Medical): No    Lack of Transportation (Non-Medical): No  Physical Activity: Insufficiently Active (09/23/2021)   Exercise Vital Sign    Days of Exercise per Week: 4 days    Minutes of Exercise per Session: 30 min  Stress: No Stress Concern Present (09/23/2021)   [place]    Feeling of Stress : Not at all  Social Connections: Moderately Integrated (09/23/2021)   Social Connection and Isolation Panel [NHANES]    Frequency of Communication with Friends and Family: More than three times a week    Frequency of Social Gatherings with Friends and Family: Three times a week    Attends Religious Services: 1 to 4 times per year    Active Member of Clubs or Organizations: Yes    Attends Archivist Meetings: More than 4 times per year    Marital Status: Widowed    Review of Systems: A 12 point ROS discussed and pertinent positives are indicated in the HPI above.  All other systems are negative.  Vital Signs: BP 112/71 (BP Location: Left Arm)   Pulse 60   Temp 98.2 F (36.8 C)   Resp 18   SpO2 100%   Physical Exam Vitals reviewed.  Constitutional:      General: He is not in acute distress.    Appearance: Normal appearance. He is not ill-appearing.  HENT:     Head: Normocephalic and atraumatic.     Nose: Nose normal.     Mouth/Throat:     Pharynx: Oropharynx is clear.  Cardiovascular:     Rate and Rhythm: Normal rate.     Pulses: Normal pulses.     Heart sounds: Normal heart sounds.  Pulmonary:     Effort: Pulmonary effort is normal.  Breath sounds: Normal breath sounds.  Abdominal:      General: Abdomen is flat.     Palpations: Abdomen is soft.     Tenderness: There is abdominal tenderness.  Musculoskeletal:     Right lower leg: No edema.     Left lower leg: No edema.  Skin:    General: Skin is warm and dry.  Neurological:     General: No focal deficit present.     Mental Status: He is alert and oriented to person, place, and time.  Psychiatric:        Mood and Affect: Mood normal.        Behavior: Behavior normal.     Imaging: US Abdomen Limited RUQ (LIVER/GB)  Result Date: 01/20/2022 CLINICAL DATA:  Right upper quadrant pain. Recent abnormal appearance on PET EXAM: ULTRASOUND ABDOMEN LIMITED RIGHT UPPER QUADRANT COMPARISON:  PET CT 01/15/22 FINDINGS: Gallbladder: Gallbladder wall is markedly thickened measuring up to 1.1 cm. There is mild hyperemia surrounding the gallbladder. There are portions of the gallbladder wall that are difficult to visualized (series 1, image 14, 28, 29). There are multiple layering gallstones. Sonographic Murphy sign was negative per patient. Common bile duct: Diameter: 6 mm Liver: No focal lesion identified. Slightly increased parenchymal echogenicity. Portal vein is patent on color Doppler imaging with normal direction of blood flow towards the liver. Other: None. IMPRESSION: Cholelithiasis with markedly thickened gallbladder wall, compatible with severe acute cholecystitis. Portions of the gallbladder wall are difficult to visualize, which could suggest gangrenous cholecystitis. Electronically Signed   By: Marin Roberts M.D.   On: 01/20/2022 11:56   NM PET Image Restage (PS) Skull Base to Thigh (F-18 FDG)  Result Date: 01/19/2022 CLINICAL DATA:  Subsequent treatment strategy for non-small cell lung cancer. EXAM: NUCLEAR MEDICINE PET SKULL BASE TO THIGH TECHNIQUE: 8.98 mCi F-18 FDG was injected intravenously. Full-ring PET imaging was performed from the skull base to thigh after the radiotracer. CT data was obtained and used for  attenuation correction and anatomic localization. Fasting blood glucose: 89 mg/dl COMPARISON:  CT chest 12/28/2021 FINDINGS: Mediastinal blood pool activity: SUV max 2.62 Liver activity: SUV max NA NECK: No hypermetabolic lymph nodes in the neck. Incidental CT findings: None. CHEST: No tracer avid supraclavicular, axillary, mediastinal, or hilar lymph nodes. Masslike architectural distortion and fibrosis with volume loss is again identified involving the left upper lobe and left upper hilar region. The appearance is unchanged from 12/29/2018 and there is low level tracer uptake within this area within SUV max of 2.93. Stable too small to characterize 4 mm nodule in the anteromedial right upper lobe, image 32/7. Stable subpleural nodule within the lateral right apex measuring 0.6 cm without corresponding increased uptake, image 14/7. No new lung nodules identified. Incidental CT findings: Centrilobular and paraseptal emphysema. Aortic atherosclerosis. Coronary artery calcifications. No pericardial effusion. ABDOMEN/PELVIS: There is marked increased radiotracer uptake localizing to the wall of the gallbladder with SUV max of 15.54. On the corresponding CT images the gallbladder wall appears diffusely thickened measuring up to 1.1 cm, image 128/4. Stones are no the gallbladder which measure up to 1.7 cm. There is mild inflammatory fat stranding within the surrounding pericholecystic soft tissues. Enlarged portacaval lymph node measures 2 cm and has an SUV max of 8.24. No abnormal increased uptake within the liver, pancreas, spleen, or adrenal glands. Incidental CT findings: Aortic atherosclerosis. SKELETON: No focal hypermetabolic activity to suggest skeletal metastasis. Incidental CT findings: None. IMPRESSION: 1. Stable appearance of masslike architectural distortion  and fibrosis with volume loss involving the left upper lobe and left upper hilar region. This exhibits low level tracer uptake within SUV max of 2.93.  Findings are favored to represent sequelae of external beam radiation. 2. No signs of tracer avid tumor recurrence or metastatic disease. 3. Gallstones with marked increased radiotracer uptake localizing to the wall of the gallbladder. The gallbladder wall appears diffusely thickened measuring up to 1.1 cm. There is mild inflammatory fat stranding within the surrounding pericholecystic soft tissues. Imaging findings are favored to represent severe acute cholecystitis. 4. Enlarged portacaval lymph node is FDG avid and is favored to represent reactive adenopathy given the close proximity to the gallbladder. 5. Aortic Atherosclerosis (ICD10-I70.0) and Emphysema (ICD10-J43.9). These results were called by telephone at the time of interpretation on 01/19/2022 at 9:05 am to provider [facility] , who verbally acknowledged these results. Electronically Signed   By: Kerby Moors M.D.   On: 01/19/2022 09:07   DG Lumbar Spine 2-3 Views  Result Date: 01/05/2022 CLINICAL DATA:  Low back pain after lifting EXAM: LUMBAR SPINE - 2-3 VIEW COMPARISON:  None Available. FINDINGS: Compression fractures of L1 and L4 with approximately 25% vertebral body height loss. These are new since most recent comparison of 11/09/2019. Multilevel lumbar spondylosis. Mild disc space height loss at L4-L5. Grade 1 anterolisthesis of L4. Advanced lower lumbar facet arthropathy. Aortic atherosclerotic calcification.  Cholelithiasis. IMPRESSION: Age-indeterminate compression fractures of L1 and L4 and acute injury is not excluded. No definite retropulsion though this is poorly assessed radiographically. Electronically Signed   By: Placido Sou M.D.   On: 01/05/2022 23:32   CT Chest W Contrast  Result Date: 12/29/2021 CLINICAL DATA:  Non-small cell lung cancer staging; * Tracking Code: BO * EXAM: CT CHEST WITH CONTRAST TECHNIQUE: Multidetector CT imaging of the chest was performed during intravenous contrast administration. RADIATION  DOSE REDUCTION: This exam was performed according to the departmental dose-optimization program which includes automated exposure control, adjustment of the mA and/or kV according to patient size and/or use of iterative reconstruction technique. CONTRAST:  38mL OMNIPAQUE IOHEXOL 300 MG/ML  SOLN COMPARISON:  Chest CT dated June 30, 2021 FINDINGS: Cardiovascular: Normal heart size. No pericardial effusion. Normal caliber thoracic aorta with moderate atherosclerotic disease. Severe coronary artery calcifications. Mediastinum/Nodes: Esophagus unremarkable. Subcarinal node is slightly increased in size, measuring 10 mm on series 2, image 73, previously 8 mm. Lungs/Pleura: Central airways are patent. Moderate paraseptal and centrilobular emphysema. Left upper lobe postradiation changes with area increased soft tissue in the left upper lobe measuring approximately 2.1 x 1.3 cm on series 5 image 44, previously this area measured approximately 1.5 x 1.2 cm. New linear opacity seen more inferiorly in the left upper lobe on series 5, image 53, likely atelectasis. Stable scattered small solid pulmonary nodules. Reference right upper lobe nodule measuring 4 mm on series 5, image 72. No pleural effusion or pneumothorax. Upper Abdomen: No acute abnormality. Musculoskeletal: New age indeterminate mild compression deformity of L1. no aggressive appearing osseous lesions. IMPRESSION: 1. Left upper lobe postradiation changes with associated subtle area of increased soft tissue, differential considerations include evolving postradiation changes or recurrent disease. Recommend PET-CT for further evaluation. 2. Subcarinal node is slightly increased in size, possibly reactive. Recommend attention on follow-up. 3. New age indeterminate mild compression deformity of L1. Correlate for point tenderness. 4. Aortic Atherosclerosis (ICD10-I70.0) and Emphysema (ICD10-J43.9). Electronically Signed   By: Yetta Glassman M.D.   On: 12/29/2021  17:31    Labs:  CBC: Recent Labs    02/24/21 1337 06/30/21 1224 12/28/21 1219 01/20/22 1035  WBC 19.3* 13.1* 10.4 10.9*  HGB 14.7 14.5 15.3 12.7*  HCT 46.5 45.0 47.1 39.2  PLT 334 300 267 318    COAGS: No results for input(s): "INR", "APTT" in the last 8760 hours.  BMP: Recent Labs    02/24/21 1337 06/30/21 1224 07/22/21 1239 12/28/21 1219 01/20/22 1035  NA 136 138 141 140 137  K 4.1 4.3 4.3 3.9 3.5  CL 100 100 101 103 103  CO2 28 30 31  33* 28  GLUCOSE 95 95 88 94 103*  BUN 12 13 11 11 9   CALCIUM 9.4 9.8 9.8 9.5 8.8*  CREATININE 0.99 0.77 0.84 0.68 0.72  GFRNONAA >60 >60  --  >60 >60    LIVER FUNCTION TESTS: Recent Labs    06/30/21 1224 07/22/21 1239 12/28/21 1219 01/20/22 1035  BILITOT 1.6* 0.6 1.3* 2.0*  AST 16 14 21  43*  ALT 9 9 17  64*  ALKPHOS 83 87 87 159*  PROT 7.5 7.3 7.4 6.9  ALBUMIN 4.1 4.3 4.0 2.7*    Assessment and Plan:  Cholecystitis with calculi [name] is a pleasant 76 year old male with significant medical history who presented to ED after 2 week history of abdominal pain and findings of cholecystitis on PET.  Additional imaging with [place] today confirms cholelithiasis and possible gangrenous cholecystitis.  In ED he has been afebrile, has a WBC of 10.9K, elevated LFTs, stable vital signs Due to age and comorbidities, Surgical service considers him a high risk operative candidate.  Dr. Serafina Royals has reviewed his case and agrees to proceed with percutaneous cholecystostomy tomorrow morning, after a 24 hour hold of Eliquis.   Orders placed for PT-INR and NPO @ [facility]  Risks and benefits discussed with the patient including, but not limited to bleeding, infection, gallbladder perforation, bile leak, sepsis or even death.  All of the patient's questions were answered, patient is agreeable to proceed. Consent signed and in IR.   Thank you for this interesting consult.  I greatly enjoyed meeting Joe [name] and look forward to  participating in their care.  A copy of this report was sent to the requesting provider on this date.  Electronically Signed: Pasty Spillers, PA 01/20/2022, 3:54 PM   I spent a total of 55 [place] in face to face in clinical consultation, greater than 50% of which was counseling/coordinating care for percutaneous cholecystostomy

## 2022-01-20 NOTE — H&P (Signed)
History and Physical    Patient: Joe [name] PFX:[id number] DOB: 19-Dec-1945 DOA: 01/20/2022 DOS: the patient was seen and examined on 01/20/2022 PCP: Cassandria Anger, MD  Patient coming from: Home  Chief Complaint:  Chief Complaint  Patient presents with   Gallbladder Infection    HPI: Joe [name] is a 76 y.o. male with medical history significant of alcohol abuse in remission, arthralgias, cataracts, CLL, COPD, de Quervain's tenosynovitis, depression, paroxysmal atrial fibrillation, dysuria, nocturia, GERD, [place], [place], hyperlipidemia, hypertension, hypothyroidism, skin cancer, grade 1 diastolic dysfunction, OSA on CPAP, periorbital cellulitis is coming to the emergency department due to having RUQ pain for over 2 weeks associated with chills and decreased appetite. He denied fever, night sweats, rhinorrhea, sore throat, wheezing or hemoptysis.  No chest pain, palpitations, diaphoresis, PND, orthopnea or pitting edema of the lower extremities.  No abdominal pain, nausea, emesis, diarrhea, constipation, melena or hematochezia.  No flank pain, dysuria, frequency or hematuria.  No polyuria, polydipsia, polyphagia or blurred vision.   ED course: Initial vital signs were temperature 97.6 F, pulse 79, respiration 18, BP 129/74 mmHg O2 sat 95% on room air.  The patient received the first dose of Zosyn.  Lab work: His urinalysis shows small hemoglobinuria and rare bacteria microscopic examination.  CBC showed a white count of 10.9 with 73% neutrophils, hemoglobin 12.7 g/dL platelets 318.  Lactic acid was normal.  Lipase is 73.  CMP showed normal renal function and electrolytes after calcium is corrected.  Total protein 6.9 and albumin 2.7 g/dL.  Alkaline phosphatase 159, AST 43 and ALT 64.  Total bilirubin was 2.0 mg/dL.  Imaging: RUQ ultrasound consistent with acute cholecystitis in the setting of cholelithiasis.  Portions of the gallbladder were difficult to visualize, which could  suggest gangrenous cholecystitis.   Review of Systems: As mentioned in the history of present illness. All other systems reviewed and are negative.  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   chronic stage 1- no symtoms   CLL (chronic lymphocytic leukemia) ([place]) 08/08/2014   06-23-2014 Dr Burr Medico Stage 0   COPD mixed type ([place]) 07/26/2007   Smoker - stopped 6/14    De Quervain's tenosynovitis, right 2014/01/06   06-22-13    Depression    at times   Dysrhythmia    PAF   Dysuria 01/06/14   9/15 - poss stricture Urol ref was offered    Gallstones 11/16/2017   Asymptomatic Pt refused surg ref   Generalized anxiety disorder 09/07/2012   Chronic   Potential benefits of a long term steroid  use as well as potential risks  and complications were explained to the patient and were aknowledged.      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  PERIORBITAL CELLULITIS 02/22/2009   Qualifier: Diagnosis of  By: Diona Browner MD, Amy     Retinal detachment    L>>R   Past Surgical History:  Procedure Laterality Date   BICEPS TENDON REPAIR     BRONCHIAL BIOPSY  10/23/2019   Procedure: BRONCHIAL BIOPSIES;  Surgeon: [name], MD;  Location: [place];  Service: Pulmonary;;   BRONCHIAL BIOPSY  11/20/2019   Procedure: BRONCHIAL BIOPSIES;  Surgeon: [name], MD;  Location: [facility] ENDOSCOPY;  Service: Pulmonary;;   BRONCHIAL BRUSHINGS  10/23/2019   Procedure:  BRONCHIAL BRUSHINGS;  Surgeon: [name], MD;  Location: [facility] ENDOSCOPY;  Service: Pulmonary;;   BRONCHIAL BRUSHINGS  11/20/2019   Procedure: BRONCHIAL BRUSHINGS;  Surgeon: [name], MD;  Location: [facility] ENDOSCOPY;  Service: Pulmonary;;   BRONCHIAL NEEDLE ASPIRATION BIOPSY  10/23/2019   Procedure: BRONCHIAL NEEDLE ASPIRATION BIOPSIES;  Surgeon: [name], MD;  Location: [facility] ENDOSCOPY;  Service: Pulmonary;;   BRONCHIAL NEEDLE ASPIRATION BIOPSY  11/20/2019   Procedure: BRONCHIAL NEEDLE ASPIRATION BIOPSIES;  Surgeon: [name], MD;  Location: [facility] ENDOSCOPY;  Service: Pulmonary;;   BRONCHIAL WASHINGS  11/20/2019   Procedure: BRONCHIAL WASHINGS;  Surgeon: [name], MD;  Location: [place];  Service: Pulmonary;;   CATARACT EXTRACTION Left    COLONOSCOPY  04-14-99   Dr Flossie Dibble polyp-TA in epic   POLYPECTOMY  04-14-99   POSTERIOR LAMINECTOMY / DECOMPRESSION CERVICAL SPINE     Dr Saintclair Halsted   RETINAL DETACHMENT SURGERY     left eye, 2007 x2, 2008 x 3   ROTATOR CUFF REPAIR  2004   right   TONSILLECTOMY  [phone]   VIDEO BRONCHOSCOPY WITH ENDOBRONCHIAL NAVIGATION N/A 10/23/2019   Procedure: VIDEO BRONCHOSCOPY WITH ENDOBRONCHIAL NAVIGATION;  Surgeon: [name], MD;  Location: [facility] ENDOSCOPY;  Service: Pulmonary;  Laterality: N/A;   VIDEO BRONCHOSCOPY WITH ENDOBRONCHIAL NAVIGATION N/A 11/20/2019   Procedure: VIDEO BRONCHOSCOPY WITH ENDOBRONCHIAL NAVIGATION;  Surgeon: [name], MD;  Location: [place] ENDOSCOPY;  Service: Pulmonary;  Laterality: N/A;   VIDEO BRONCHOSCOPY WITH ENDOBRONCHIAL ULTRASOUND N/A 10/23/2019   Procedure: VIDEO BRONCHOSCOPY WITH ENDOBRONCHIAL ULTRASOUND;  Surgeon: [name], MD;  Location: [place] ENDOSCOPY;  Service: Pulmonary;  Laterality: N/A;   Social History:  reports that he quit smoking about 9 years ago. His smoking use included cigarettes. He has a 40.00 pack-year smoking history. He has never used smokeless tobacco. He reports that he does not  currently use alcohol. He reports that he does not use drugs.  No Known Allergies  Family History  Problem Relation Age of Onset   COPD Mother    Diabetes Father    Coronary artery disease Other    Breast cancer Paternal Aunt 6   Colon cancer Neg Hx     Prior to Admission medications   Medication Sig Start Date End Date Taking? Authorizing Provider  albuterol (PROVENTIL) (2.5 MG/3ML) 0.083% nebulizer solution Take 3 mLs (2.5 mg total) by nebulization every 6 (six) hours as needed for wheezing or shortness of breath. 03/04/21   Maryjane Hurter, MD  amLODipine (NORVASC) 5 MG tablet TAKE 1 TABLET BY MOUTH EVERY DAY Patient taking differently: Take 5 mg by mouth daily. 11/09/21   [name], [name], MD  apixaban (ELIQUIS) 5 MG TABS tablet TAKE 1 TABLET(5 MG) BY MOUTH TWICE DAILY Patient taking differently: Take 5 mg by mouth 2 (two) times daily. 09/25/21   Sueanne Margarita, MD  atenolol (TENORMIN) 25 MG tablet Take  1 tablet (25 mg total) by mouth daily as needed (palpitations). 07/29/21   [name], [name], MD  Cholecalciferol 25 MCG (1000 UT) tablet Take 1,000 Units by mouth daily.    [provider]  clotrimazole-betamethasone (LOTRISONE) cream Apply topically 2 (two) times daily. Patient taking differently: Apply topically 2 (two) times daily. As needed 12/24/20   [name], [name], MD  diazepam (VALIUM) 5 MG tablet Take 1 tablet (5 mg total) by mouth every 12 (twelve) hours as needed for anxiety. 07/29/21   [name], [name], MD  levothyroxine (SYNTHROID) 112 MCG tablet TAKE 1 TABLET(112 MCG) BY MOUTH DAILY Patient taking differently: Take 112 mcg by mouth daily. 11/09/21   Heilingoetter, Cassandra L, PA-C  lovastatin (MEVACOR) 20 MG tablet TAKE 1 TABLET BY MOUTH EVERY NIGHT AT BEDTIME Patient taking differently: Take 20 mg by mouth at bedtime. 08/03/21   [name], [name], MD  Multiple Vitamin (MULTIVITAMIN) tablet Take 1 tablet by mouth daily. Centrum Silver.     [provider]  Polyethyl Glycol-Propyl Glycol (SYSTANE) 0.4-0.3 % SOLN Place 1 drop into both eyes daily as needed (Dry eyes). Ultra    [provider]    Physical Exam: Vitals:   01/20/22 1147 01/20/22 1215 01/20/22 1300 01/20/22 1405  BP: 107/64 116/66 112/71   Pulse: (!) 59 63 60   Resp: 18 18 18    Temp:    98.2 F (36.8 C)  TempSrc:      SpO2: 100% 99% 100%    Physical Exam Vitals and nursing note reviewed.  Constitutional:      General: He is awake. He is not in acute distress.    Appearance: Normal appearance.  HENT:     Head: Normocephalic.     Mouth/Throat:     Mouth: Mucous membranes are dry.  Eyes:     General: Scleral icterus present.     Pupils: Pupils are equal, round, and reactive to light.  Neck:     Vascular: No JVD.  Cardiovascular:     Rate and Rhythm: Normal rate and regular rhythm.     Heart sounds: S1 normal and S2 normal.  Pulmonary:     Effort: Pulmonary effort is normal.     Breath sounds: Normal breath sounds. No wheezing, rhonchi or rales.  Abdominal:     General: Bowel sounds are normal. There is no distension.     Palpations: Abdomen is soft.     Tenderness: There is abdominal tenderness. There is right CVA tenderness. There is no left CVA tenderness, guarding or rebound.  Musculoskeletal:     Cervical back: Neck supple.     Right lower leg: No edema.     Left lower leg: No edema.  Skin:    General: Skin is warm and dry.  Neurological:     General: No focal deficit present.     Mental Status: He is alert and oriented to person, place, and time.  Psychiatric:        Mood and Affect: Mood normal.        Behavior: Behavior normal. Behavior is cooperative.   Data Reviewed:  Results are pending, will review when available.  Echocardiogram 03/03/2021 IMPRESSIONS    1. Left ventricular ejection fraction, by estimation, is 60 to 65%. Left  ventricular ejection fraction by 3D volume is 64 %. The left ventricle has   normal function. The left ventricle has no regional wall motion  abnormalities. Left ventricular diastolic   parameters are consistent with  Grade I diastolic dysfunction (impaired  relaxation). The average left ventricular global longitudinal strain is  -23.0 %. The global longitudinal strain is normal.   2. Right ventricular systolic function is normal. The right ventricular  size is normal.   3. The mitral valve is grossly normal. Trivial mitral valve  regurgitation.   4. The aortic valve is tricuspid. Aortic valve regurgitation is mild to  moderate. Aortic valve sclerosis is present, with no evidence of aortic  valve stenosis. Aortic valve mean gradient measures 10.0 mmHg.   5. Aortic dilatation noted. There is borderline dilatation of the aortic  root, measuring 39 mm. There is mild dilatation of the ascending aorta,  measuring 42 mm.   6. The inferior vena cava is normal in size with greater than 50%  respiratory variability, suggesting right atrial pressure of 3 mmHg.   Assessment and Plan: Principal Problem:   Acute cholecystitis Inpatient/telemetry. Clear liquid diet. Keep NPO after midnight. Continue time-limited IV fluids. Analgesics as needed. Antiemetics as needed. Continue Zosyn 3.375 g every 8 hours Pantoprazole 40 mg IVP every 24 hours. Keep electrolytes optimized. Follow-up CBC and CMP in AM. General surgery input appreciated. Interventional radiology consult appreciated. Schedule for percutaneous cholecystostomy in AM.  Active Problems:   Dyslipidemia Hold statin due to abnormal LFTs.    Essential hypertension Continue amlodipine 5 mg p.o. daily.    Grade I diastolic dysfunction No signs of decompensation. Currently only taking atenolol as needed. Follow-up with cardiology as an outpatient as scheduled.    GERD Continue PPI.    CLL (chronic lymphocytic leukemia) (HCC) Monitor white blood cell count. Outpatient follow-up with hematology.     Hypothyroidism Continue levothyroxine 112 mcg p.o. daily.    Atrial flutter (HCC)   Paroxysmal atrial fibrillation (HCC) CHA2DS2-VASc Score of at least 5. Apixaban has been held due to procedure.    OSA on CPAP Continue CPAP at bedtime.    Advance Care Planning:   Code Status: Full Code   Consults: General surgery.  Family Communication:   Severity of Illness: The appropriate patient status for this patient is INPATIENT. Inpatient status is judged to be reasonable and necessary in order to provide the required intensity of service to ensure the patient's safety. The patient's presenting symptoms, physical exam findings, and initial radiographic and laboratory data in the context of their chronic comorbidities is felt to place them at high risk for further clinical deterioration. Furthermore, it is not anticipated that the patient will be medically stable for discharge from the hospital within 2 midnights of admission.   * I certify that at the point of admission it is my clinical judgment that the patient will require inpatient hospital care spanning beyond 2 midnights from the point of admission due to high intensity of service, high risk for further deterioration and high frequency of surveillance required.*  Author: Reubin Milan, MD 01/20/2022 2:25 PM  For on call review [email or web address].   This document was prepared using Dragon voice recognition software and may contain some unintended transcription errors.

## 2022-01-20 NOTE — ED Triage Notes (Signed)
Pt reports being sent by Ca dr for infection in gallbladder. Pt reports he has hx of lung ca. Reports recent fevers & dark colored urine as well.

## 2022-01-20 NOTE — ED Provider Notes (Signed)
Emergency Department Provider Note   I have reviewed the triage vital signs and the nursing notes.   HISTORY  Chief Complaint Gallbladder Infection    HPI Joe [name] is a 76 y.o. male with past history of COPD, CLL, and PAF on Eliquis presents to the emergency department with right upper quadrant abdominal pain with nausea and subjective fever.  Patient states symptoms are actually worse last week but does continue to have some right-sided abdominal "soreness." No fever in the last several days. He had a PET scan on 10/27 and was called with increased signal uptake in the gallbladder and was called and advised to present to the ED. He has also noted some change in urine color where it became darker in color.    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   chronic stage 1- no symtoms   CLL (chronic lymphocytic leukemia) ([place]) 08/08/2014   2014-06-25 Dr Burr Medico Stage 0   COPD mixed type ([place]) 07/26/2007   Smoker - stopped 6/14    De Quervain's tenosynovitis, right 01-08-14   06/24/2013    Depression    at times   Dysrhythmia    PAF   Dysuria Jan 08, 2014   9/15 - poss stricture Urol ref was offered    Gallstones 11/16/2017   Asymptomatic Pt refused surg ref   Generalized anxiety disorder 09/07/2012   Chronic   Potential benefits of a Christion Leonhard term steroid  use as well as potential risks  and complications were explained to the patient and were aknowledged.      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 ENT: No sore throat. Cardiovascular: Denies chest pain. Respiratory: Denies shortness of breath. Gastrointestinal: Positive RUQ abdominal pain. Mild nausea, no vomiting.  No diarrhea.  No constipation. Genitourinary: Negative for dysuria. Positive darker urine.  Musculoskeletal: Negative for back pain. Skin: Negative for rash. Neurological: Negative for headaches, focal weakness or numbness.  ____________________________________________   PHYSICAL EXAM:  VITAL SIGNS: ED Triage Vitals  Enc Vitals Group     BP 01/20/22 0948 129/74     Pulse Rate 01/20/22 0948 79     Resp 01/20/22 0948 18     Temp 01/20/22 0948 97.6 F (36.4 C)     Temp Source 01/20/22 0948 Oral     SpO2 01/20/22 0948 95 %   Constitutional: Alert and oriented. Well appearing and in no acute distress. Eyes: Conjunctivae are normal. Head: Atraumatic. Nose: No congestion/rhinnorhea. Mouth/Throat: Mucous membranes are moist.  Oropharynx non-erythematous. Neck: No  stridor.  Cardiovascular: Normal rate, regular rhythm. Good peripheral circulation. Grossly normal heart sounds.   Respiratory: Normal respiratory effort.  No retractions. Lungs CTAB. Gastrointestinal: Soft with focal tenderness in the RUQ. No peritonitis. No distention.  Musculoskeletal: No lower extremity tenderness nor edema. No gross deformities of extremities. Neurologic:  Normal speech and language. No gross focal neurologic deficits are appreciated.  Skin:  Skin is warm, dry and intact. No rash noted.  ____________________________________________   LABS (all labs ordered are listed, but only abnormal results are displayed)  Labs Reviewed  URINE CULTURE  CULTURE, BLOOD (ROUTINE X 2)   CULTURE, BLOOD (ROUTINE X 2)  COMPREHENSIVE METABOLIC PANEL  LIPASE, BLOOD  CBC WITH DIFFERENTIAL/PLATELET  URINALYSIS, ROUTINE W REFLEX MICROSCOPIC  LACTIC ACID, PLASMA  LACTIC ACID, PLASMA   ____________________________________________  RADIOLOGY  No results found.  ____________________________________________   PROCEDURES  Procedure(s) performed:   Procedures   ____________________________________________   INITIAL IMPRESSION / ASSESSMENT AND PLAN / ED COURSE  Pertinent labs & imaging results that were available during my care of the patient were reviewed by me and considered in my medical decision making (see chart for details).   This patient is Presenting for Evaluation of abdominal pain, which does require a range of treatment options, and is a complaint that involves a high risk of morbidity and mortality.  The Differential Diagnoses include but not limited to cholecystitis, ascending cholangitis, symptomatic choledocholithiasis, gastritis, GERD, AKI, etc, .  I decided to review pertinent External Data, and in summary patient's PET scan from the 27th showing increased update in the gallbladder wall with surrounding inflammation. Oncology note from yesterday advised ED evaluation.   Clinical Laboratory Tests Ordered, included ***  Radiologic Tests Ordered, included ***. I independently interpreted the images and agree with radiology interpretation.   Cardiac Monitor Tracing which shows NSR.   Social Determinants of Health Risk no active smoking.   Consult complete with  Medical Decision Making: Summary:  Patient presents emergency department with right upper quadrant abdominal pain over the past several weeks but continues to have some tenderness.  Describes change in urine color and abnormal PET scan from 10/27.  Will obtain repeat lab work and right upper quadrant ultrasound for assessment. Afebrile here. No AMS. No SIRS vitals.   Reevaluation with update  and discussion with   ***Considered admission***  Disposition:   ____________________________________________  FINAL CLINICAL IMPRESSION(S) / ED DIAGNOSES  Final diagnoses:  None     NEW OUTPATIENT MEDICATIONS STARTED DURING THIS VISIT:  New Prescriptions   No medications on file    Note:  This document was prepared using Dragon voice recognition software and may include unintentional dictation errors.  Nanda Quinton, MD, [facility] Emergency [facility]

## 2022-01-20 NOTE — ED Notes (Signed)
Receiving RN Mee Hives has agreed to accept [facility] once pt has arrived to inpatient unit, all questions and concerns address.

## 2022-01-20 NOTE — ED Notes (Signed)
Secure message sent to receiving RN Vera , awaiting response. Pt has an inpatient bed at this time

## 2022-01-21 ENCOUNTER — Inpatient Hospital Stay: Payer: Medicare Other | Admitting: Internal Medicine

## 2022-01-21 ENCOUNTER — Inpatient Hospital Stay (HOSPITAL_COMMUNITY): Payer: Medicare Other

## 2022-01-21 ENCOUNTER — Other Ambulatory Visit: Payer: Self-pay | Admitting: Radiology

## 2022-01-21 DIAGNOSIS — K810 Acute cholecystitis: Secondary | ICD-10-CM | POA: Diagnosis not present

## 2022-01-21 HISTORY — PX: IR PERC CHOLECYSTOSTOMY: IMG2326

## 2022-01-21 LAB — CBC
HCT: 36.7 % — ABNORMAL LOW (ref 39.0–52.0)
Hemoglobin: 11.4 g/dL — ABNORMAL LOW (ref 13.0–17.0)
MCH: 28.9 pg (ref 26.0–34.0)
MCHC: 31.1 g/dL (ref 30.0–36.0)
MCV: 93.1 fL (ref 80.0–100.0)
Platelets: 337 10*3/uL (ref 150–400)
RBC: 3.94 MIL/uL — ABNORMAL LOW (ref 4.22–5.81)
RDW: 15.5 % (ref 11.5–15.5)
WBC: 11.1 10*3/uL — ABNORMAL HIGH (ref 4.0–10.5)
nRBC: 0 % (ref 0.0–0.2)

## 2022-01-21 LAB — COMPREHENSIVE METABOLIC PANEL
ALT: 54 U/L — ABNORMAL HIGH (ref 0–44)
AST: 41 U/L (ref 15–41)
Albumin: 2.4 g/dL — ABNORMAL LOW (ref 3.5–5.0)
Alkaline Phosphatase: 130 U/L — ABNORMAL HIGH (ref 38–126)
Anion gap: 5 (ref 5–15)
BUN: 8 mg/dL (ref 8–23)
CO2: 31 mmol/L (ref 22–32)
Calcium: 8.8 mg/dL — ABNORMAL LOW (ref 8.9–10.3)
Chloride: 106 mmol/L (ref 98–111)
Creatinine, Ser: 0.78 mg/dL (ref 0.61–1.24)
GFR, Estimated: >60 mL/min (ref >60)
Glucose, Bld: 99 mg/dL (ref 70–99)
Potassium: 4.6 mmol/L (ref 3.5–5.1)
Sodium: 142 mmol/L (ref 135–145)
Total Bilirubin: 1.5 mg/dL — ABNORMAL HIGH (ref 0.3–1.2)
Total Protein: 6.1 g/dL — ABNORMAL LOW (ref 6.5–8.1)

## 2022-01-21 LAB — URINE CULTURE: Culture: NO GROWTH

## 2022-01-21 MED ORDER — MIDAZOLAM HCL 2 MG/2ML IJ SOLN
INTRAMUSCULAR | Status: AC
  Filled 2022-01-21: qty 4

## 2022-01-21 MED ORDER — FENTANYL CITRATE (PF) 100 MCG/2ML IJ SOLN
INTRAMUSCULAR | Status: AC
  Filled 2022-01-21: qty 2

## 2022-01-21 MED ORDER — IOHEXOL 300 MG/ML  SOLN
50.0000 mL | Freq: Once | INTRAMUSCULAR | Status: AC | PRN
  Administered 2022-01-21: 5 mL

## 2022-01-21 MED ORDER — ORAL CARE MOUTH RINSE: 15.0000 mL | OROMUCOSAL | Status: DC | PRN

## 2022-01-21 MED ORDER — SODIUM CHLORIDE 0.9% FLUSH
5.0000 mL | Freq: Three times a day (TID) | INTRAVENOUS | Status: DC
  Administered 2022-01-21 – 2022-01-24 (×9): 5 mL

## 2022-01-21 MED ORDER — FENTANYL CITRATE (PF) 100 MCG/2ML IJ SOLN
INTRAMUSCULAR | Status: AC | PRN
  Administered 2022-01-21 (×2): 50 ug via INTRAVENOUS

## 2022-01-21 MED ORDER — LIDOCAINE HCL 1 % IJ SOLN
INTRAMUSCULAR | Status: AC
  Administered 2022-01-21: 10 mL via INTRADERMAL
  Filled 2022-01-21: qty 20

## 2022-01-21 MED ORDER — MIDAZOLAM HCL 2 MG/2ML IJ SOLN
INTRAMUSCULAR | Status: AC | PRN
  Administered 2022-01-21 (×2): 1 mg via INTRAVENOUS

## 2022-01-21 NOTE — Progress Notes (Signed)
Initial Nutrition Assessment  DOCUMENTATION CODES:   Severe malnutrition in context of acute illness/injury  INTERVENTION:  -Provide Ensure Plus HP (350kcal, 20g protein) if average intake <75% meals -Continue regular diet as tolerated -Encourage regular meals and snacks -Include high calorie/high protein foods throughout the day -Consider carnation breakfast essentials or Ensure supplements at home.  NUTRITION DIAGNOSIS:  Severe Malnutrition related to acute illness as evidenced by moderate fat depletion, moderate muscle depletion, energy intake < or equal to 50% for > or equal to 5 days.  GOAL:  Patient will meet greater than or equal to 90% of their needs  MONITOR:  PO intake  REASON FOR ASSESSMENT:  Malnutrition Screening Tool    ASSESSMENT:  Pt is a 76yo M with PMH of alcohol abuse in remission, arthralgias, cataracts, CLL, COPD, depression, paroxysmal afib, dysuria, nocturia, GERD, HLD, HTN, paresis, hypothyroidism, skin cancer, grade 1 diastolic dysfunction, OSA, periorbital cellulitis who presents with RUQ pain for over 2 weeks associated with chills and decreased appetite.  Pt is s/p percutaneous cholecystostomy this AM. Visited pt at bedside at breakfast. He reports very poor appetite and po intake x 2 weeks PTA. In this time he thinks he has lost weight and has been getting progressively weaker. NFPE shows moderate muscle wasting and moderate fat loss. Pt meets ASPEN criteria for severe protein calorie malnutrition r/t acute illness.   After percutaneous cholecystostomy placement pt is feeling better. Observed 100% meal consumption at breakfast. Discussed usual po intake when pt is feeling well. He reports eating 2 mini muffins at breakfast, hummus, pita bread and yogurt at lunch and a marie callendars frozen meal at dinner. He will drink Coca Cola throughout the day and snack on pb on wheat bread, Breyer's ice cream and fruit. Pt does not think he is getting enough protein.  Discussed ways to increase protein. Discussed affordable supplements and whole foods to include throughout the day. Pt asked about Na in frozen meals, made suggestions for lower Na options. Pt reports he's been told not to cook because he wears oxygen. Reviewed microwavable meals or no cook meals. Offered ONS during admission but pt declined at this time. If pt continues to eat 100% of meals, likely to meet estimated needs with diet alone.  Medications reviewed and include: synthroid, zosyn  Labs reviewed: Corrected Ca:9.7 (Ca:8.8, albumin:2.4), alk phos:130, Total bili:1.5, ALT:54  NUTRITION - FOCUSED PHYSICAL EXAM: Flowsheet Row Most Recent Value  Orbital Region Moderate depletion  Upper Arm Region Moderate depletion  Thoracic and Lumbar Region Moderate depletion  Buccal Region Mild depletion  Temple Region Moderate depletion  Clavicle Bone Region Moderate depletion  Clavicle and Acromion Bone Region Moderate depletion  Scapular Bone Region Moderate depletion  Dorsal Hand Mild depletion  Patellar Region Moderate depletion  Anterior Thigh Region Moderate depletion  Posterior Calf Region Moderate depletion  Edema (RD Assessment) None  Hair Reviewed  Eyes Reviewed  Mouth Reviewed  Skin Reviewed  Nails Reviewed       Diet Order:   Diet Order             Diet regular Room service appropriate? Yes; Fluid consistency: Thin  Diet effective now                   EDUCATION NEEDS:   Education needs have been addressed  Skin:  Skin Assessment: Reviewed RN Assessment  Last BM:  11/1  Height:  Ht Readings from Last 1 Encounters:  01/20/22 _0  (1.803 m)  Weight:  Wt Readings from Last 1 Encounters:  01/20/22 80.7 kg    BMI:  Body mass index is 24.83 kg/m.  Estimated Nutritional Needs:  Kcal:  [id number] Protein:  80-100g Fluid:  2020-2468m  KCandise Bowens MS, RD, LDN, CNSC See AMiON for contact information

## 2022-01-21 NOTE — Progress Notes (Signed)
Pt arrived to unit via stretcher alert and oriented x4. Oriented to room and callbell with no complications. 2RN assessment and initial assessment completed. Tele#[id number] placed.pain 0/10. Will continue to monitor

## 2022-01-21 NOTE — Progress Notes (Signed)
Patient continues to decline nocturnal CPAP. He asks that staff not visit with him each night in an attempt to get him to use CPAP during his hospital stay. He states he uses a CPAP nightly at home, is compliant and uses 2L oxygen bled into the system. However, he does not want to wear a CPAP while in the hospital and states he does not think he needs it as long as he has oxygen on and remains in an upright position in the bed. Education provided and he conveys understanding that CPAP is not primarily used for oxygenation, but is used for sleep apnea and he is able to explain the difference. No equipment is in the room at his time. CPAP order changed to prn, per patient request. He understands that he may request a machine and RT assistance at any time should he change his mind and be more compliant with use.

## 2022-01-21 NOTE — Progress Notes (Signed)
PROGRESS NOTE    Joe [name]  SWN:[id number] DOB: February 23, 1946 DOA: 01/20/2022 PCP: Cassandria Anger, MD    Brief Narrative:  76 year old gentleman with history of CLL, COPD, depression, paroxysmal A-fib, GERD, hypertension presented with right upper quadrant pain with chills and decreased appetite for 2 weeks.  Hemodynamically stable in the ER.  He was found to have mildly elevated transaminases, right upper quadrant ultrasound was consistent with acute cholecystitis with gallstone.  Seen by surgery.  Admitted with antibiotics and underwent percutaneous drainage by IR.   Assessment & Plan:   Acute cholecystitis with cholelithiasis.  Gallbladder abscess: Currently hemodynamically stable. Status post IR guided percutaneous cholecystostomy with purulent drainage. Blood cultures pending.  Surgical cultures pending.  Remains on Zosyn.  Continue maintenance IV fluids.  Advance diet.  Mobilize.  Adequate pain medications.  Surgery following.  Anticipate discharge home with cholecystostomy tube and outpatient follow-up.  Chronic medical issues including  Essential hypertension: Stable on amlodipine GERD: PPI CLL, stable Hypothyroidism: On levothyroxine 112 mcg. Paroxysmal a flutter: Sinus rhythm.  Will resume Eliquis when okayed by surgery. OSA on CPAP.   DVT prophylaxis: SCDs Start: 01/20/22 1357   Code Status: Full code Family Communication: None at the bedside Disposition Plan: Status is: Inpatient Remains inpatient appropriate because: Inpatient procedures, IV antibiotics     Consultants:  General surgery IR  Procedures:  Percutaneous cholecystostomy by IR 11/2  Antimicrobials:  Zosyn 11/1---   Subjective: Patient seen in the morning rounds.  He had just arrived from procedure.  Denies any complaints.  Objective: Vitals:   01/21/22 0850 01/21/22 0855 01/21/22 0900 01/21/22 0926  BP: 107/66 107/67 (!) 116/103 104/65  Pulse: (!) 52 (!) 53 (!) 53 (!) 49   Resp: 16 12 13 17   Temp:    97.8 F (36.6 C)  TempSrc:    Oral  SpO2: 99% 100% 99% 100%  Weight:      Height:        Intake/Output Summary (Last 24 hours) at 01/21/2022 1245 Last data filed at 01/21/2022 0317 Gross per 24 hour  Intake --  Output 700 ml  Net -700 ml   Filed Weights   01/20/22 2009  Weight: 80.7 kg    Examination:  General exam: Appears calm and comfortable  Respiratory system: Clear to auscultation. Respiratory effort normal.  No added sounds. Cardiovascular system: S1 & S2 heard, RRR. No pedal edema. Gastrointestinal system: Soft.  Immediate postop.  Percutaneous tube draining purulent bile.  No rigidity or guarding. Central nervous system: Alert and oriented. No focal neurological deficits. Extremities: Symmetric 5 x 5 power. Skin: No rashes, lesions or ulcers Psychiatry: Judgement and insight appear normal. Mood & affect appropriate.     Data Reviewed: I have personally reviewed following labs and imaging studies  CBC: Recent Labs  Lab 01/20/22 1035 01/21/22 0511  WBC 10.9* 11.1*  NEUTROABS 8.1*  --   HGB 12.7* 11.4*  HCT 39.2 36.7*  MCV 91.6 93.1  PLT 318 938   Basic Metabolic Panel: Recent Labs  Lab 01/20/22 1035 01/21/22 0723  NA 137 142  K 3.5 4.6  CL 103 106  CO2 28 31  GLUCOSE 103* 99  BUN 9 8  CREATININE 0.72 0.78  CALCIUM 8.8* 8.8*  MG 2.1  --   PHOS 3.3  --    GFR: Estimated Creatinine Clearance: 85 mL/min (by C-G formula based on SCr of 0.78 mg/dL). Liver Function Tests: Recent Labs  Lab 01/20/22 1035 01/21/22 0723  AST 43* 41  ALT 64* 54*  ALKPHOS 159* 130*  BILITOT 2.0* 1.5*  PROT 6.9 6.1*  ALBUMIN 2.7* 2.4*   Recent Labs  Lab 01/20/22 1035  LIPASE 73*   No results for input(s): "AMMONIA" in the last 168 hours. Coagulation Profile: Recent Labs  Lab 01/20/22 2127  INR 1.3*   Cardiac Enzymes: No results for input(s): "CKTOTAL", "CKMB", "CKMBINDEX", "TROPONINI" in the last 168 hours. BNP (last 3  results) No results for input(s): "PROBNP" in the last 8760 hours. HbA1C: No results for input(s): "HGBA1C" in the last 72 hours. CBG: Recent Labs  Lab 01/15/22 1301  GLUCAP 89   Lipid Profile: No results for input(s): "CHOL", "HDL", "LDLCALC", "TRIG", "CHOLHDL", "LDLDIRECT" in the last 72 hours. Thyroid Function Tests: No results for input(s): "TSH", "T4TOTAL", "FREET4", "T3FREE", "THYROIDAB" in the last 72 hours. Anemia Panel: No results for input(s): "VITAMINB12", "FOLATE", "FERRITIN", "TIBC", "IRON", "RETICCTPCT" in the last 72 hours. Sepsis Labs: Recent Labs  Lab 01/20/22 1035 01/20/22 1218  LATICACIDVEN 0.8 0.8    Recent Results (from the past 240 hour(s))  Culture, blood (routine x 2)     Status: None (Preliminary result)   Collection Time: 01/20/22 10:35 AM   Specimen: BLOOD  Result Value Ref Range Status   Specimen Description   Final    BLOOD BLOOD LEFT FOREARM Performed at [facility] [address] [place], [place] [address]    Special Requests   Final    BOTTLES DRAWN AEROBIC AND ANAEROBIC Blood Culture adequate volume Performed at [facility] [address], [place], [place] [address]    Culture   Final    NO GROWTH < 24 HOURS Performed at [facility] [address] [place], [place] [address]    Report Status PENDING  Incomplete  Culture, blood (routine x 2)     Status: None (Preliminary result)   Collection Time: 01/20/22 10:39 AM   Specimen: BLOOD  Result Value Ref Range Status   Specimen Description   Final    BLOOD LEFT ANTECUBITAL Performed at [facility] [address] [place], [place] [address]    Special Requests   Final    BOTTLES DRAWN AEROBIC AND ANAEROBIC Blood Culture adequate volume Performed at [facility] [address], [place], [place] [address]    Culture   Final    NO GROWTH < 24 HOURS Performed at [facility] [address] [place], [place] [address]    Report Status PENDING  Incomplete  Urine Culture     Status: None   Collection Time: 01/20/22 11:40 AM   Specimen: Urine, Clean Catch  Result Value Ref Range Status   Specimen Description   Final    URINE, CLEAN CATCH Performed at [facility], [place] [address] [place], [place] [address]    Special Requests   Final    NONE Performed at [facility], [place] [address], [place], [place] [address]    Culture   Final    NO GROWTH Performed at [facility], [place] [address] [place],  [address]    Report Status 01/21/2022 FINAL  Final         Radiology Studies: IR Perc Cholecystostomy  Result Date: 01/21/2022 INDICATION: 76 year old with history of acute calculus cholecystitis, poor surgical candidate. EXAM: Ultrasound and fluoroscopic guided percutaneous cholecystostomy tube placement MEDICATIONS: The patient was receiving intravenous antibiotics as an  inpatient.; The antibiotics were administered within an appropriate time frame prior to the initiation of the procedure. ANESTHESIA/SEDATION: Moderate (conscious) sedation was employed during this procedure. A total of Versed 2 mg and Fentanyl 100 mcg was administered intravenously. Moderate Sedation Time: 10 minutes. The patient's level of consciousness and vital signs were monitored continuously by radiology nursing throughout the procedure under my direct supervision. FLUOROSCOPY TIME:  Four mGy COMPLICATIONS: None immediate. PROCEDURE: Informed written consent was obtained from the patient after a thorough discussion of the procedural risks, benefits and alternatives. All questions were addressed. Maximal Sterile Barrier Technique was utilized including caps, mask, sterile gowns, sterile gloves, sterile drape, hand hygiene and skin antiseptic. A timeout was performed prior to the initiation of the procedure. The patient was placed supine on the angiographic table.  The patient's right upper quadrant was then prepped and draped in normal sterile fashion with maximum sterile barrier. Ultrasound demonstrates a distended gallbladder with multiple echogenic gallstones. Subdermal Local anesthesia was provided at the planned skin entry site. Under ultrasound guidance, deeper local anesthetic was provided through intercostal muscles and along the liver capsule. Ultrasound was used to puncture the gallbladder using a 18 gauge trocar needle via a subcostal transhepatic approach with visualization of the lung treated to the gallbladder. There was efflux of thick, purulent fluid when the stylet was removed. A 0.035 inch exchange wire was placed in the tract was dilated. A 10.2 French multipurpose drainage catheter was advanced into the gallbladder lumen. The drain was then secured in place using a 0-silk suture and a Stayfix device. Approximately 40 of purulent fluid was aspirated and a sample sent culture. A sterile dressing was applied. The tube was placed to bulb suction. The patient tolerated procedure well without evidence of immediate complication was transferred back to the floor in stable condition. IMPRESSION: Successful placement of 10.2 French percutaneous, subcostal transhepatic cholecystostomy tube. PLAN: If the patient is deemed a poor surgical candidate indefinitely, he would likely benefit from percutaneous choledochoscopic (Spyglass) gallstone retrieval and hopefully eventual tube removal. IR will arrange for 8 week follow-up cholecystostomy tube check and exchange and will discuss further plan with the patient at that time. Ruthann Cancer, MD [facility] Radiology Electronically Signed   By: Ruthann Cancer M.D.   On: 01/21/2022 09:24   MR ABDOMEN MRCP W WO CONTAST  Addendum Date: 01/21/2022   ADDENDUM REPORT: 01/21/2022 01:14 ADDENDUM: In regards to the splenic lesion, while this may reflect the sequela of remote trauma  or inflammation or a nonaggressive lesion such as a littoral cell angioma, low-grade lymphoma should also be considered in the differential despite its lack of hypermetabolism on PET CT examination of 01/15/2022 and lack of significant restricted diffusion. In absence of prior contrast enhanced examinations, a follow-up examination in 6-12 months may be helpful to ensure continued stability. Electronically Signed   By: Fidela Salisbury M.D.   On: 01/21/2022 01:14   Result Date: 01/21/2022 CLINICAL DATA:  Cholelithiasis, choledocholithiasis EXAM: MRI ABDOMEN WITHOUT AND WITH CONTRAST (INCLUDING MRCP) TECHNIQUE: Multiplanar multisequence MR imaging of the abdomen was performed both before and after the administration of intravenous contrast. Heavily T2-weighted images of the biliary and pancreatic ducts were obtained, and three-dimensional MRCP images were rendered by post processing. CONTRAST:  5mL GADAVIST GADOBUTROL 1 MMOL/ML IV SOLN COMPARISON:  None Available. FINDINGS: Lower chest: No acute findings. Hepatobiliary: Normal hepatic parenchymal signal intensity. 10 mm enhancing lesion within the inferior right hepatic lobe, axial image #  62/15, is mildly hypointense on T1 weighted imaging, hyperintense on T2 weighted imaging, and demonstrates early arterial filling with no significant washout and most likely represents a small flash fill hemangioma. No other intrahepatic lesions are identified. No intrahepatic biliary ductal dilation. The gallbladder contains multiple gallstones. The gallbladder is thick walled. There is hyperemia of the gallbladder wall with irregularity of the wall seen within the fundus suggesting areas of focal perforation, best appreciated on image # 18/5 and image # 47/19. There is extensive surrounding edema. Mild reactive hyperemia within the adjacent gallbladder fossa. Together, the findings are in keeping with acute perforated cholecystitis. No intra or extrahepatic biliary ductal  dilation. No choledocholithiasis. Pancreas: No mass, inflammatory changes, or other parenchymal abnormality identified. Spleen: A 17 mm subcapsular lesion is seen within the lateral aspect of the spleen at axial image # 13/5 which is hypointense on T1 and T2 weighted imaging and demonstrates relative hypoenhancement and no restricted diffusion likely representing the sequela of remote trauma or inflammation or potentially a lesion such as a littoral cell angioma. An inflammatory granuloma of sarcoidosis is considered less likely given its solitary nature. Mild splenomegaly is present with the spleen measuring 14.2 cm in greatest dimension. Adrenals/Urinary Tract: Adrenal glands are unremarkable. The kidneys are normal in size and position. Multiple simple cortical and parapelvic cysts are seen bilaterally. No follow-up imaging is recommended for these lesions. Mild bilateral nonspecific perinephric edema is present. No enhancing intrarenal masses. No hydronephrosis. Stomach/Bowel: Small duodenal diverticulum. The visualized large and small bowel are otherwise unremarkable. Vascular/Lymphatic: Enlarged periportal lymph node seen at axial image # 17/5 likely represents reactive adenopathy related to the inflammatory process within the gallbladder. No additional pathologic adenopathy within the abdomen. The abdominal vasculature is unremarkable. Other:  None Musculoskeletal: Remote appearing L1 and L5 compression deformities are seen. Nonenhancing lesion within the L3 vertebral body may represent a small cyst or hemangioma. IMPRESSION: 1. MR findings compatible with acute perforated cholecystitis with multiple gallstones and focal perforation of the gallbladder fundus. 2. No evidence of choledocholithiasis. 3. 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 4. 17 mm subcapsular lesion within the lateral aspect of the spleen likely representing the  sequela of remote trauma or inflammation or potentially a lesion such as a littoral cell angioma. An inflammatory granuloma of sarcoidosis is considered less likely given its solitary nature. 5. Mild splenomegaly. 6. Remote appearing L1 and L5 compression deformities. Electronically Signed: By: Fidela Salisbury M.D. On: 01/21/2022 00:37   MR 3D Recon At Scanner  Addendum Date: 01/21/2022   ADDENDUM REPORT: 01/21/2022 01:14 ADDENDUM: In regards to the splenic lesion, while this may reflect the sequela of remote trauma or inflammation or a nonaggressive lesion such as a littoral cell angioma, low-grade lymphoma should also be considered in the differential despite its lack of hypermetabolism on PET CT examination of 01/15/2022 and lack of significant restricted diffusion. In absence of prior contrast enhanced examinations, a follow-up examination in 6-12 months may be helpful to ensure continued stability. Electronically Signed   By: Fidela Salisbury M.D.   On: 01/21/2022 01:14   Result Date: 01/21/2022 CLINICAL DATA:  Cholelithiasis, choledocholithiasis EXAM: MRI ABDOMEN WITHOUT AND WITH CONTRAST (INCLUDING MRCP) TECHNIQUE: Multiplanar multisequence MR imaging of the abdomen was performed both before and after the administration of intravenous contrast. Heavily T2-weighted images of the biliary and pancreatic ducts were obtained, and three-dimensional MRCP images were rendered by post processing. CONTRAST:  26mL GADAVIST GADOBUTROL 1 MMOL/ML IV SOLN COMPARISON:  None Available. FINDINGS: Lower chest: No acute findings. Hepatobiliary: Normal hepatic parenchymal signal intensity. 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 No other intrahepatic lesions are identified. No  intrahepatic biliary ductal dilation. The gallbladder contains multiple gallstones. The gallbladder is thick walled. There is hyperemia of the gallbladder wall with irregularity of the wall seen within the fundus suggesting areas of focal perforation, best appreciated on image # 18/5 and image # 47/19. There is extensive surrounding edema. Mild reactive hyperemia within the adjacent gallbladder fossa. Together, the findings are in keeping with acute perforated cholecystitis. No intra or extrahepatic biliary ductal dilation. No choledocholithiasis. Pancreas: No mass, inflammatory changes, or other parenchymal abnormality identified. Spleen: A 17 mm subcapsular lesion is seen within the lateral aspect of the spleen at axial image # 13/5 which is hypointense on T1 and T2 weighted imaging and demonstrates relative hypoenhancement and no restricted diffusion likely representing the sequela of remote trauma or inflammation or potentially a lesion such as a littoral cell angioma. An inflammatory granuloma of sarcoidosis is considered less likely given its solitary nature. Mild splenomegaly is present with the spleen measuring 14.2 cm in greatest dimension. Adrenals/Urinary Tract: Adrenal glands are unremarkable. The kidneys are normal in size and position. Multiple simple cortical and parapelvic cysts are seen bilaterally. No follow-up imaging is recommended for these lesions. Mild bilateral nonspecific perinephric edema is present. No enhancing intrarenal masses. No hydronephrosis. Stomach/Bowel: Small duodenal diverticulum. The visualized large and small bowel are otherwise unremarkable. Vascular/Lymphatic: Enlarged periportal lymph node seen at axial image # 17/5 likely represents reactive adenopathy related to the inflammatory process within the gallbladder. No additional pathologic adenopathy within the abdomen. The abdominal vasculature is unremarkable. Other:  None Musculoskeletal: Remote appearing L1 and L5  compression deformities are seen. Nonenhancing lesion within the L3 vertebral body may represent a small cyst or hemangioma. IMPRESSION: 1. MR findings compatible with acute perforated cholecystitis with multiple gallstones and focal perforation of the gallbladder fundus. 2. No evidence of choledocholithiasis. 3. 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 4. 17 mm subcapsular lesion within the lateral aspect of the spleen likely representing the sequela of remote trauma or inflammation or potentially a lesion such as a littoral cell angioma. An inflammatory granuloma of sarcoidosis is considered less likely given its solitary nature. 5. Mild splenomegaly. 6. Remote appearing L1 and L5 compression deformities. Electronically Signed: By: Fidela Salisbury M.D. On: 01/21/2022 00:37   US Abdomen Limited RUQ (LIVER/GB)  Result Date: 01/20/2022 CLINICAL DATA:  Right upper quadrant pain. Recent abnormal appearance on PET EXAM: ULTRASOUND ABDOMEN LIMITED RIGHT UPPER QUADRANT COMPARISON:  PET CT 01/15/22 FINDINGS: Gallbladder: Gallbladder wall is markedly thickened measuring up to 1.1 cm. There is mild hyperemia surrounding the gallbladder. There are portions of the gallbladder wall that are difficult to visualized (series 1, image 14, 28, 29). There are multiple layering gallstones. Sonographic Murphy sign was negative per patient. Common bile duct: Diameter: 6 mm Liver: No focal lesion identified. Slightly increased parenchymal echogenicity. Portal vein is patent on color Doppler imaging with normal direction of blood flow towards the liver. Other: None. IMPRESSION: Cholelithiasis with markedly thickened gallbladder wall, compatible with severe  acute cholecystitis. Portions of the gallbladder wall are difficult to visualize, which could suggest gangrenous cholecystitis. Electronically Signed   By: Marin Roberts M.D.   On: 01/20/2022 11:56         Scheduled Meds:  amLODipine  5 mg Oral Daily   [id number]       levothyroxine  112 mcg Oral Daily   midazolam       Continuous Infusions:  piperacillin-tazobactam 3.375 g (01/21/22 0544)     LOS: 1 day    Time spent: 40 minutes    Barb Merino, MD [facility] Pager [phone]

## 2022-01-21 NOTE — Procedures (Signed)
Interventional Radiology Procedure Note  Procedure: Image guided percutaneous cholecystostomy tube placement.  Findings: Please refer to procedural dictation for full description. Cholelithiasis with obstructed cystic duct.  Subcostal, transhepatic approach for placement of 10.2 Fr drain, to bulb suction.  Purulent aspirate sample sent for culture.  Complications: None immediate  Estimated Blood Loss: < 5 mL  Recommendations: Keep to bulb suction for now. Follow up cultures. IR will arrange for 8 week follow up.  The patient would likely be a good candidate for choledochoscopic (Spyglass) guided percutaneous gallstone retrieval if not a surgical candidate.   Ruthann Cancer, MD Pager: [phone]

## 2022-01-21 NOTE — Plan of Care (Signed)

## 2022-01-22 ENCOUNTER — Other Ambulatory Visit (HOSPITAL_COMMUNITY): Payer: Self-pay

## 2022-01-22 DIAGNOSIS — E43 Unspecified severe protein-calorie malnutrition: Secondary | ICD-10-CM | POA: Insufficient documentation

## 2022-01-22 DIAGNOSIS — K810 Acute cholecystitis: Secondary | ICD-10-CM | POA: Diagnosis not present

## 2022-01-22 LAB — COMPREHENSIVE METABOLIC PANEL
ALT: 44 U/L (ref 0–44)
AST: 33 U/L (ref 15–41)
Albumin: 2.3 g/dL — ABNORMAL LOW (ref 3.5–5.0)
Alkaline Phosphatase: 106 U/L (ref 38–126)
Anion gap: 6 (ref 5–15)
BUN: 9 mg/dL (ref 8–23)
CO2: 29 mmol/L (ref 22–32)
Calcium: 8 mg/dL — ABNORMAL LOW (ref 8.9–10.3)
Chloride: 103 mmol/L (ref 98–111)
Creatinine, Ser: 0.81 mg/dL (ref 0.61–1.24)
GFR, Estimated: >60 mL/min (ref >60)
Glucose, Bld: 119 mg/dL — ABNORMAL HIGH (ref 70–99)
Potassium: 3.9 mmol/L (ref 3.5–5.1)
Sodium: 138 mmol/L (ref 135–145)
Total Bilirubin: 1.5 mg/dL — ABNORMAL HIGH (ref 0.3–1.2)
Total Protein: 5.8 g/dL — ABNORMAL LOW (ref 6.5–8.1)

## 2022-01-22 LAB — CBC
HCT: 36.9 % — ABNORMAL LOW (ref 39.0–52.0)
Hemoglobin: 11.3 g/dL — ABNORMAL LOW (ref 13.0–17.0)
MCH: 28.7 pg (ref 26.0–34.0)
MCHC: 30.6 g/dL (ref 30.0–36.0)
MCV: 93.7 fL (ref 80.0–100.0)
Platelets: 340 10*3/uL (ref 150–400)
RBC: 3.94 MIL/uL — ABNORMAL LOW (ref 4.22–5.81)
RDW: 15.7 % — ABNORMAL HIGH (ref 11.5–15.5)
WBC: 13 10*3/uL — ABNORMAL HIGH (ref 4.0–10.5)
nRBC: 0 % (ref 0.0–0.2)

## 2022-01-22 MED ORDER — ALBUTEROL SULFATE (2.5 MG/3ML) 0.083% IN NEBU: 2.5000 mg | INHALATION_SOLUTION | Freq: Four times a day (QID) | RESPIRATORY_TRACT | Status: DC | PRN

## 2022-01-22 MED ORDER — NORMAL SALINE FLUSH 0.9 % IV SOLN
10.0000 mL | Freq: Every day | INTRAVENOUS | 0 refills | Status: DC
  Filled 2022-01-22 – 2022-02-15 (×2): qty 560, 56d supply, fill #0

## 2022-01-22 MED ORDER — APIXABAN 5 MG PO TABS
5.0000 mg | ORAL_TABLET | Freq: Two times a day (BID) | ORAL | Status: DC
  Administered 2022-01-22 – 2022-01-24 (×5): 5 mg via ORAL
  Filled 2022-01-22 (×5): qty 1

## 2022-01-22 MED ORDER — PIPERACILLIN-TAZOBACTAM 3.375 G IVPB
3.3750 g | Freq: Three times a day (TID) | INTRAVENOUS | Status: DC
  Administered 2022-01-22 – 2022-01-24 (×5): 3.375 g via INTRAVENOUS
  Filled 2022-01-22 (×5): qty 50

## 2022-01-22 NOTE — Progress Notes (Signed)
Subjective: Still with some pain in RUQ and especially around the drain.  Eating solid diet with no n/v  ROS: See above, otherwise other systems negative  Objective: Vital signs in last 24 hours: Temp:  [97.8 F (36.6 C)-99 F (37.2 C)] 98.3 F (36.8 C) (11/03 0501) Pulse Rate:  [58-64] 60 (11/03 0501) Resp:  [14-20] 20 (11/03 0501) BP: (94-119)/(55-58) 101/58 (11/03 0501) SpO2:  [100 %] 100 % (11/03 0501) Last BM Date : 01/20/22  Intake/Output from previous day: 11/02 0701 - 11/03 0700 In: 450.5 [P.O.:360; IV Piggyback:85.5] Out: 970 [Urine:950; Drains:20] Intake/Output this shift: No intake/output data recorded.  PE: Abd: soft, tender as expected, perc chole drain to suction with purulent, bloody, bilious effluent   Lab Results:  Recent Labs    01/21/22 0511 01/22/22 0528  WBC 11.1* 13.0*  HGB 11.4* 11.3*  HCT 36.7* 36.9*  PLT 337 340   BMET Recent Labs    01/21/22 0723 01/22/22 0528  NA 142 138  K 4.6 3.9  CL 106 103  CO2 31 29  GLUCOSE 99 119*  BUN 8 9  CREATININE 0.78 0.81  CALCIUM 8.8* 8.0*   PT/INR Recent Labs    01/20/22 2127  LABPROT 16.3*  INR 1.3*   CMP     Component Value Date/Time   NA 138 01/22/2022 0528   NA 143 03/10/2017 0911   K 3.9 01/22/2022 0528   K 4.7 03/10/2017 0911   CL 103 01/22/2022 0528   CO2 29 01/22/2022 0528   CO2 30 (H) 03/10/2017 0911   GLUCOSE 119 (H) 01/22/2022 0528   GLUCOSE 90 03/10/2017 0911   BUN 9 01/22/2022 0528   BUN 10.7 03/10/2017 0911   CREATININE 0.81 01/22/2022 0528   CREATININE 0.68 12/28/2021 1219   CREATININE 0.8 03/10/2017 0911   CALCIUM 8.0 (L) 01/22/2022 0528   CALCIUM 9.4 03/10/2017 0911   PROT 5.8 (L) 01/22/2022 0528   PROT 7.6 03/10/2017 0911   ALBUMIN 2.3 (L) 01/22/2022 0528   ALBUMIN 4.1 03/10/2017 0911   AST 33 01/22/2022 0528   AST 21 12/28/2021 1219   AST 34 03/10/2017 0911   ALT 44 01/22/2022 0528   ALT 17 12/28/2021 1219   ALT 45 03/10/2017 0911   ALKPHOS 106  01/22/2022 0528   ALKPHOS 76 03/10/2017 0911   BILITOT 1.5 (H) 01/22/2022 0528   BILITOT 1.3 (H) 12/28/2021 1219   BILITOT 1.41 (H) 03/10/2017 0911   GFRNONAA >60 01/22/2022 0528   GFRNONAA >60 12/28/2021 1219   GFRAA >60 12/24/2019 1042   Lipase     Component Value Date/Time   LIPASE 73 (H) 01/20/2022 1035       Studies/Results: IR Perc Cholecystostomy  Result Date: 01/21/2022 INDICATION: 76 year old with history of acute calculus cholecystitis, poor surgical candidate. EXAM: Ultrasound and fluoroscopic guided percutaneous cholecystostomy tube placement MEDICATIONS: The patient was receiving intravenous antibiotics as an inpatient.; The antibiotics were administered within an appropriate time frame prior to the initiation of the procedure. ANESTHESIA/SEDATION: Moderate (conscious) sedation was employed during this procedure. A total of Versed 2 mg and Fentanyl 100 mcg was administered intravenously. Moderate Sedation Time: 10 minutes. The patient's level of consciousness and vital signs were monitored continuously by radiology nursing throughout the procedure under my direct supervision. FLUOROSCOPY TIME:  Four mGy COMPLICATIONS: None immediate. PROCEDURE: Informed written consent was obtained from the patient after a thorough discussion of the procedural risks, benefits and alternatives. All questions were addressed. Maximal  Sterile Barrier Technique was utilized including caps, mask, sterile gowns, sterile gloves, sterile drape, hand hygiene and skin antiseptic. A timeout was performed prior to the initiation of the procedure. The patient was placed supine on the angiographic table. The patient's right upper quadrant was then prepped and draped in normal sterile fashion with maximum sterile barrier. Ultrasound demonstrates a distended gallbladder with multiple echogenic gallstones. Subdermal Local anesthesia was provided at the planned skin entry site. Under ultrasound guidance, deeper local  anesthetic was provided through intercostal muscles and along the liver capsule. Ultrasound was used to puncture the gallbladder using a 18 gauge trocar needle via a subcostal transhepatic approach with visualization of the lung treated to the gallbladder. There was efflux of thick, purulent fluid when the stylet was removed. A 0.035 inch exchange wire was placed in the tract was dilated. A 10.2 French multipurpose drainage catheter was advanced into the gallbladder lumen. The drain was then secured in place using a 0-silk suture and a Stayfix device. Approximately 40 of purulent fluid was aspirated and a sample sent culture. A sterile dressing was applied. The tube was placed to bulb suction. The patient tolerated procedure well without evidence of immediate complication was transferred back to the floor in stable condition. IMPRESSION: Successful placement of 10.2 French percutaneous, subcostal transhepatic cholecystostomy tube. PLAN: If the patient is deemed a poor surgical candidate indefinitely, he would likely benefit from percutaneous choledochoscopic (Spyglass) gallstone retrieval and hopefully eventual tube removal. IR will arrange for 8 week follow-up cholecystostomy tube check and exchange and will discuss further plan with the patient at that time. Ruthann Cancer, MD [facility] Radiology Electronically Signed   By: Ruthann Cancer M.D.   On: 01/21/2022 09:24   MR ABDOMEN MRCP W WO CONTAST  Addendum Date: 01/21/2022   ADDENDUM REPORT: 01/21/2022 01:14 ADDENDUM: In regards to the splenic lesion, while this may reflect the sequela of remote trauma or inflammation or a nonaggressive lesion such as a littoral cell angioma, low-grade lymphoma should also be considered in the differential despite its lack of hypermetabolism on PET CT examination of 01/15/2022 and lack of significant restricted diffusion. In absence of prior contrast enhanced examinations, a  follow-up examination in 6-12 months may be helpful to ensure continued stability. Electronically Signed   By: Fidela Salisbury M.D.   On: 01/21/2022 01:14   Result Date: 01/21/2022 CLINICAL DATA:  Cholelithiasis, choledocholithiasis EXAM: MRI ABDOMEN WITHOUT AND WITH CONTRAST (INCLUDING MRCP) TECHNIQUE: Multiplanar multisequence MR imaging of the abdomen was performed both before and after the administration of intravenous contrast. Heavily T2-weighted images of the biliary and pancreatic ducts were obtained, and three-dimensional MRCP images were rendered by post processing. CONTRAST:  33mL GADAVIST GADOBUTROL 1 MMOL/ML IV SOLN COMPARISON:  None Available. FINDINGS: Lower chest: No acute findings. Hepatobiliary: Normal hepatic parenchymal signal intensity. 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 No other intrahepatic lesions are identified. No intrahepatic biliary ductal dilation. The gallbladder contains multiple gallstones. The gallbladder is thick walled. There is hyperemia of the gallbladder wall with irregularity of the wall seen within the fundus suggesting areas of focal perforation, best appreciated on image # 18/5 and image # 47/19. There is extensive surrounding edema. Mild reactive hyperemia within the adjacent gallbladder fossa. Together, the  findings are in keeping with acute perforated cholecystitis. No intra or extrahepatic biliary ductal dilation. No choledocholithiasis. Pancreas: No mass, inflammatory changes, or other parenchymal abnormality identified. Spleen: A 17 mm subcapsular lesion is seen within the lateral aspect of the spleen at axial image # 13/5 which is hypointense on T1 and T2 weighted imaging and demonstrates relative hypoenhancement  and no restricted diffusion likely representing the sequela of remote trauma or inflammation or potentially a lesion such as a littoral cell angioma. An inflammatory granuloma of sarcoidosis is considered less likely given its solitary nature. Mild splenomegaly is present with the spleen measuring 14.2 cm in greatest dimension. Adrenals/Urinary Tract: Adrenal glands are unremarkable. The kidneys are normal in size and position. Multiple simple cortical and parapelvic cysts are seen bilaterally. No follow-up imaging is recommended for these lesions. Mild bilateral nonspecific perinephric edema is present. No enhancing intrarenal masses. No hydronephrosis. Stomach/Bowel: Small duodenal diverticulum. The visualized large and small bowel are otherwise unremarkable. Vascular/Lymphatic: Enlarged periportal lymph node seen at axial image # 17/5 likely represents reactive adenopathy related to the inflammatory process within the gallbladder. No additional pathologic adenopathy within the abdomen. The abdominal vasculature is unremarkable. Other:  None Musculoskeletal: Remote appearing L1 and L5 compression deformities are seen. Nonenhancing lesion within the L3 vertebral body may represent a small cyst or hemangioma. IMPRESSION: 1. MR findings compatible with acute perforated cholecystitis with multiple gallstones and focal perforation of the gallbladder fundus. 2. No evidence of choledocholithiasis. 3. 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 4. 17 mm subcapsular lesion within the lateral aspect of the spleen likely representing the sequela of remote trauma or inflammation or potentially a lesion such as a littoral cell angioma. An inflammatory granuloma of sarcoidosis is considered less likely given its solitary nature. 5. Mild splenomegaly. 6. Remote appearing L1 and L5 compression deformities. Electronically Signed: By: Fidela Salisbury M.D. On:  01/21/2022 00:37   MR 3D Recon At Scanner  Addendum Date: 01/21/2022   ADDENDUM REPORT: 01/21/2022 01:14 ADDENDUM: In regards to the splenic lesion, while this may reflect the sequela of remote trauma or inflammation or a nonaggressive lesion such as a littoral cell angioma, low-grade lymphoma should also be considered in the differential despite its lack of hypermetabolism on PET CT examination of 01/15/2022 and lack of significant restricted diffusion. In absence of prior contrast enhanced examinations, a follow-up examination in 6-12 months may be helpful to ensure continued stability. Electronically Signed   By: Fidela Salisbury M.D.   On: 01/21/2022 01:14   Result Date: 01/21/2022 CLINICAL DATA:  Cholelithiasis, choledocholithiasis EXAM: MRI ABDOMEN WITHOUT AND WITH CONTRAST (INCLUDING MRCP) TECHNIQUE: Multiplanar multisequence MR imaging of the abdomen was performed both before and after the administration of intravenous contrast. Heavily T2-weighted images of the biliary and pancreatic ducts were obtained, and three-dimensional MRCP images were rendered by post processing. CONTRAST:  73mL GADAVIST GADOBUTROL 1 MMOL/ML IV SOLN COMPARISON:  None Available. FINDINGS: Lower chest: No acute findings. Hepatobiliary: Normal hepatic parenchymal signal intensity. 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 No other intrahepatic lesions are identified. No intrahepatic biliary ductal dilation. The gallbladder contains multiple gallstones. The gallbladder is thick walled. There is hyperemia of the gallbladder wall with irregularity  of the wall seen within the fundus suggesting areas of focal perforation, best appreciated on image # 18/5 and image # 47/19. There is extensive  surrounding edema. Mild reactive hyperemia within the adjacent gallbladder fossa. Together, the findings are in keeping with acute perforated cholecystitis. No intra or extrahepatic biliary ductal dilation. No choledocholithiasis. Pancreas: No mass, inflammatory changes, or other parenchymal abnormality identified. Spleen: A 17 mm subcapsular lesion is seen within the lateral aspect of the spleen at axial image # 13/5 which is hypointense on T1 and T2 weighted imaging and demonstrates relative hypoenhancement and no restricted diffusion likely representing the sequela of remote trauma or inflammation or potentially a lesion such as a littoral cell angioma. An inflammatory granuloma of sarcoidosis is considered less likely given its solitary nature. Mild splenomegaly is present with the spleen measuring 14.2 cm in greatest dimension. Adrenals/Urinary Tract: Adrenal glands are unremarkable. The kidneys are normal in size and position. Multiple simple cortical and parapelvic cysts are seen bilaterally. No follow-up imaging is recommended for these lesions. Mild bilateral nonspecific perinephric edema is present. No enhancing intrarenal masses. No hydronephrosis. Stomach/Bowel: Small duodenal diverticulum. The visualized large and small bowel are otherwise unremarkable. Vascular/Lymphatic: Enlarged periportal lymph node seen at axial image # 17/5 likely represents reactive adenopathy related to the inflammatory process within the gallbladder. No additional pathologic adenopathy within the abdomen. The abdominal vasculature is unremarkable. Other:  None Musculoskeletal: Remote appearing L1 and L5 compression deformities are seen. Nonenhancing lesion within the L3 vertebral body may represent a small cyst or hemangioma. IMPRESSION: 1. MR findings compatible with acute perforated cholecystitis with multiple gallstones and focal perforation of the gallbladder fundus. 2. No evidence of choledocholithiasis. 3. 10 mm enhancing  lesion within the inferior right hepatic lobe most likely represents a small flash fill hemangioma. 4. 17 mm subcapsular lesion within the lateral aspect of the spleen likely representing the sequela of remote trauma or inflammation or potentially a lesion such as a littoral cell angioma. An inflammatory granuloma of sarcoidosis is considered less likely given its solitary nature. 5. Mild splenomegaly. 6. Remote appearing L1 and L5 compression deformities. Electronically Signed: By: Fidela Salisbury M.D. On: 01/21/2022 00:37   US Abdomen Limited RUQ (LIVER/GB)  Result Date: 01/20/2022 CLINICAL DATA:  Right upper quadrant pain. Recent abnormal appearance on PET EXAM: ULTRASOUND ABDOMEN LIMITED RIGHT UPPER QUADRANT COMPARISON:  PET CT 01/15/22 FINDINGS: Gallbladder: Gallbladder wall is markedly thickened measuring up to 1.1 cm. There is mild hyperemia surrounding the gallbladder. There are portions of the gallbladder wall that are difficult to visualized (series 1, image 14, 28, 29). There are multiple layering gallstones. Sonographic Murphy sign was negative per patient. Common bile duct: Diameter: 6 mm Liver: No focal lesion identified. Slightly increased parenchymal echogenicity. Portal vein is patent on color Doppler imaging with normal direction of blood flow towards the liver. Other: None. IMPRESSION: Cholelithiasis with markedly thickened gallbladder wall, compatible with severe acute cholecystitis. Portions of the gallbladder wall are difficult to visualize, which could suggest gangrenous cholecystitis. Electronically Signed   By: Marin Roberts M.D.   On: 01/20/2022 11:56    Anti-infectives: Anti-infectives (From admission, onward)    Start     Dose/Rate Route Frequency Ordered Stop   01/20/22 2000  piperacillin-tazobactam (ZOSYN) IVPB 3.375 g        3.375 g 12.5 mL/hr over 240 Minutes Intravenous Every 8 hours 01/20/22 1356     01/20/22 1300  piperacillin-tazobactam (ZOSYN) IVPB 3.375 g  3.375 g 100 mL/hr over 30 Minutes Intravenous  Once 01/20/22 1254 01/20/22 1406        Assessment/Plan Gangrenous, perforated cholecystitis S/p IR perc chole drain 11/2 -cxs - prelim with gram - rods -cont abx therapy, WBC 13K -cont regular diet   -LFTs overall improving, TB stable around 1.5 -follow up being arranged in our office in 6 weeks -will need IR follow up for drain clinic as well  FEN - regular diet VTE - may resume DOAC when ok with IR ID - zosyn  I reviewed Consultant IR notes, hospitalist notes, last 24 h vitals and pain scores, last 48 h intake and output, last 24 h labs and trends, and last 24 h imaging results.   LOS: 2 days    Henreitta Cea , [facility] Surgery 01/22/2022, 9:57 AM Please see Amion for pager number during day hours 7:00am-4:30pm or 7:00am -11:30am on weekends

## 2022-01-22 NOTE — Progress Notes (Signed)
Mobility Specialist - Progress Note   01/22/22 1326  Mobility  Activity Ambulated with assistance in hallway  Level of Assistance Contact guard assist, steadying assist  Assistive Device Cane  Distance Ambulated (ft) 300 ft  Activity Response Tolerated well  Mobility Referral Yes  $Mobility charge 1 Mobility   Pt received in bed and agreed to mobility, no c/o pain nor discomfort, pt returned to bed with all needs met.   Roderick Pee Mobility Specialist

## 2022-01-22 NOTE — Progress Notes (Signed)
  Transition of Care [facility]) Screening Note   Patient Details  Name: Joe [name] Date of Birth: [birth year]   Transition of Care [facility]) CM/SW Contact:    Vassie Moselle, LCSW Phone Number: 01/22/2022, 12:17 PM    Transition of Care Department [facility]) has reviewed patient and no TOC needs have been identified at this time. We will continue to monitor patient advancement through interdisciplinary progression rounds. If new patient transition needs arise, please place a TOC consult.

## 2022-01-22 NOTE — Progress Notes (Signed)
LATE ENTRY - PATIENT SEEN YESTERDAY    Subjective: Pain after IR tube placed.  ROS: See above, otherwise other systems negative  Objective: Vital signs in last 24 hours: Temp:  [97.8 F (36.6 C)-99 F (37.2 C)] 98.3 F (36.8 C) (11/03 0501) Pulse Rate:  [58-64] 60 (11/03 0501) Resp:  [14-20] 20 (11/03 0501) BP: (94-119)/(55-58) 101/58 (11/03 0501) SpO2:  [100 %] 100 % (11/03 0501) Last BM Date : 01/20/22  Intake/Output from previous day: 11/02 0701 - 11/03 0700 In: 450.5 [P.O.:360; IV Piggyback:85.5] Out: 970 [Urine:950; Drains:20] Intake/Output this shift: Total I/O In: 890.1 [P.O.:709; IV Piggyback:181.1] Out: 275 [Urine:275]  PE: Abd: soft, tender as expected, perc chole drain to suction with purulent, bloody, bilious effluent   Lab Results:  Recent Labs    01/21/22 0511 01/22/22 0528  WBC 11.1* 13.0*  HGB 11.4* 11.3*  HCT 36.7* 36.9*  PLT 337 340    BMET Recent Labs    01/21/22 0723 01/22/22 0528  NA 142 138  K 4.6 3.9  CL 106 103  CO2 31 29  GLUCOSE 99 119*  BUN 8 9  CREATININE 0.78 0.81  CALCIUM 8.8* 8.0*    PT/INR Recent Labs    01/20/22 2127  LABPROT 16.3*  INR 1.3*    CMP     Component Value Date/Time   NA 138 01/22/2022 0528   NA 143 03/10/2017 0911   K 3.9 01/22/2022 0528   K 4.7 03/10/2017 0911   CL 103 01/22/2022 0528   CO2 29 01/22/2022 0528   CO2 30 (H) 03/10/2017 0911   GLUCOSE 119 (H) 01/22/2022 0528   GLUCOSE 90 03/10/2017 0911   BUN 9 01/22/2022 0528   BUN 10.7 03/10/2017 0911   CREATININE 0.81 01/22/2022 0528   CREATININE 0.68 12/28/2021 1219   CREATININE 0.8 03/10/2017 0911   CALCIUM 8.0 (L) 01/22/2022 0528   CALCIUM 9.4 03/10/2017 0911   PROT 5.8 (L) 01/22/2022 0528   PROT 7.6 03/10/2017 0911   ALBUMIN 2.3 (L) 01/22/2022 0528   ALBUMIN 4.1 03/10/2017 0911   AST 33 01/22/2022 0528   AST 21 12/28/2021 1219   AST 34 03/10/2017 0911   ALT 44 01/22/2022 0528   ALT 17 12/28/2021 1219   ALT 45 03/10/2017  0911   ALKPHOS 106 01/22/2022 0528   ALKPHOS 76 03/10/2017 0911   BILITOT 1.5 (H) 01/22/2022 0528   BILITOT 1.3 (H) 12/28/2021 1219   BILITOT 1.41 (H) 03/10/2017 0911   GFRNONAA >60 01/22/2022 0528   GFRNONAA >60 12/28/2021 1219   GFRAA >60 12/24/2019 1042   Lipase     Component Value Date/Time   LIPASE 73 (H) 01/20/2022 1035       Studies/Results: IR Perc Cholecystostomy  Result Date: 01/21/2022 INDICATION: 76 year old with history of acute calculus cholecystitis, poor surgical candidate. EXAM: Ultrasound and fluoroscopic guided percutaneous cholecystostomy tube placement MEDICATIONS: The patient was receiving intravenous antibiotics as an inpatient.; The antibiotics were administered within an appropriate time frame prior to the initiation of the procedure. ANESTHESIA/SEDATION: Moderate (conscious) sedation was employed during this procedure. A total of Versed 2 mg and Fentanyl 100 mcg was administered intravenously. Moderate Sedation Time: 10 minutes. The patient's level of consciousness and vital signs were monitored continuously by radiology nursing throughout the procedure under my direct supervision. FLUOROSCOPY TIME:  Four mGy COMPLICATIONS: None immediate. PROCEDURE: Informed written consent was obtained from the patient after a thorough discussion of the procedural risks, benefits and alternatives. All questions were addressed.  Maximal Sterile Barrier Technique was utilized including caps, mask, sterile gowns, sterile gloves, sterile drape, hand hygiene and skin antiseptic. A timeout was performed prior to the initiation of the procedure. The patient was placed supine on the angiographic table. The patient's right upper quadrant was then prepped and draped in normal sterile fashion with maximum sterile barrier. Ultrasound demonstrates a distended gallbladder with multiple echogenic gallstones. Subdermal Local anesthesia was provided at the planned skin entry site. Under ultrasound  guidance, deeper local anesthetic was provided through intercostal muscles and along the liver capsule. Ultrasound was used to puncture the gallbladder using a 18 gauge trocar needle via a subcostal transhepatic approach with visualization of the lung treated to the gallbladder. There was efflux of thick, purulent fluid when the stylet was removed. A 0.035 inch exchange wire was placed in the tract was dilated. A 10.2 French multipurpose drainage catheter was advanced into the gallbladder lumen. The drain was then secured in place using a 0-silk suture and a Stayfix device. Approximately 40 of purulent fluid was aspirated and a sample sent culture. A sterile dressing was applied. The tube was placed to bulb suction. The patient tolerated procedure well without evidence of immediate complication was transferred back to the floor in stable condition. IMPRESSION: Successful placement of 10.2 French percutaneous, subcostal transhepatic cholecystostomy tube. PLAN: If the patient is deemed a poor surgical candidate indefinitely, he would likely benefit from percutaneous choledochoscopic (Spyglass) gallstone retrieval and hopefully eventual tube removal. IR will arrange for 8 week follow-up cholecystostomy tube check and exchange and will discuss further plan with the patient at that time. Ruthann Cancer, MD [facility] Radiology Electronically Signed   By: Ruthann Cancer M.D.   On: 01/21/2022 09:24   MR ABDOMEN MRCP W WO CONTAST  Addendum Date: 01/21/2022   ADDENDUM REPORT: 01/21/2022 01:14 ADDENDUM: In regards to the splenic lesion, while this may reflect the sequela of remote trauma or inflammation or a nonaggressive lesion such as a littoral cell angioma, low-grade lymphoma should also be considered in the differential despite its lack of hypermetabolism on PET CT examination of 01/15/2022 and lack of significant restricted diffusion. In absence of prior contrast  enhanced examinations, a follow-up examination in 6-12 months may be helpful to ensure continued stability. Electronically Signed   By: Fidela Salisbury M.D.   On: 01/21/2022 01:14   Result Date: 01/21/2022 CLINICAL DATA:  Cholelithiasis, choledocholithiasis EXAM: MRI ABDOMEN WITHOUT AND WITH CONTRAST (INCLUDING MRCP) TECHNIQUE: Multiplanar multisequence MR imaging of the abdomen was performed both before and after the administration of intravenous contrast. Heavily T2-weighted images of the biliary and pancreatic ducts were obtained, and three-dimensional MRCP images were rendered by post processing. CONTRAST:  4mL GADAVIST GADOBUTROL 1 MMOL/ML IV SOLN COMPARISON:  None Available. FINDINGS: Lower chest: No acute findings. Hepatobiliary: Normal hepatic parenchymal signal intensity. 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 No other intrahepatic lesions are identified. No intrahepatic biliary ductal dilation. The gallbladder contains multiple gallstones. The gallbladder is thick walled. There is hyperemia of the gallbladder wall with irregularity of the wall seen within the fundus suggesting areas of focal perforation, best appreciated on image # 18/5 and image # 47/19. There is extensive surrounding edema. Mild reactive hyperemia within the adjacent gallbladder fossa. Together,  the findings are in keeping with acute perforated cholecystitis. No intra or extrahepatic biliary ductal dilation. No choledocholithiasis. Pancreas: No mass, inflammatory changes, or other parenchymal abnormality identified. Spleen: A 17 mm subcapsular lesion is seen within the lateral aspect of the spleen at axial image # 13/5 which is hypointense on T1 and T2 weighted imaging and demonstrates  relative hypoenhancement and no restricted diffusion likely representing the sequela of remote trauma or inflammation or potentially a lesion such as a littoral cell angioma. An inflammatory granuloma of sarcoidosis is considered less likely given its solitary nature. Mild splenomegaly is present with the spleen measuring 14.2 cm in greatest dimension. Adrenals/Urinary Tract: Adrenal glands are unremarkable. The kidneys are normal in size and position. Multiple simple cortical and parapelvic cysts are seen bilaterally. No follow-up imaging is recommended for these lesions. Mild bilateral nonspecific perinephric edema is present. No enhancing intrarenal masses. No hydronephrosis. Stomach/Bowel: Small duodenal diverticulum. The visualized large and small bowel are otherwise unremarkable. Vascular/Lymphatic: Enlarged periportal lymph node seen at axial image # 17/5 likely represents reactive adenopathy related to the inflammatory process within the gallbladder. No additional pathologic adenopathy within the abdomen. The abdominal vasculature is unremarkable. Other:  None Musculoskeletal: Remote appearing L1 and L5 compression deformities are seen. Nonenhancing lesion within the L3 vertebral body may represent a small cyst or hemangioma. IMPRESSION: 1. MR findings compatible with acute perforated cholecystitis with multiple gallstones and focal perforation of the gallbladder fundus. 2. No evidence of choledocholithiasis. 3. 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 4. 17 mm subcapsular lesion within the lateral aspect of the spleen likely representing the sequela of remote trauma or inflammation or potentially a lesion such as a littoral cell angioma. An inflammatory granuloma of sarcoidosis is considered less likely given its solitary nature. 5. Mild splenomegaly. 6. Remote appearing L1 and L5 compression deformities. Electronically Signed: By:  Fidela Salisbury M.D. On: 01/21/2022 00:37   MR 3D Recon At Scanner  Addendum Date: 01/21/2022   ADDENDUM REPORT: 01/21/2022 01:14 ADDENDUM: In regards to the splenic lesion, while this may reflect the sequela of remote trauma or inflammation or a nonaggressive lesion such as a littoral cell angioma, low-grade lymphoma should also be considered in the differential despite its lack of hypermetabolism on PET CT examination of 01/15/2022 and lack of significant restricted diffusion. In absence of prior contrast enhanced examinations, a follow-up examination in 6-12 months may be helpful to ensure continued stability. Electronically Signed   By: Fidela Salisbury M.D.   On: 01/21/2022 01:14   Result Date: 01/21/2022 CLINICAL DATA:  Cholelithiasis, choledocholithiasis EXAM: MRI ABDOMEN WITHOUT AND WITH CONTRAST (INCLUDING MRCP) TECHNIQUE: Multiplanar multisequence MR imaging of the abdomen was performed both before and after the administration of intravenous contrast. Heavily T2-weighted images of the biliary and pancreatic ducts were obtained, and three-dimensional MRCP images were rendered by post processing. CONTRAST:  32mL GADAVIST GADOBUTROL 1 MMOL/ML IV SOLN COMPARISON:  None Available. FINDINGS: Lower chest: No acute findings. Hepatobiliary: Normal hepatic parenchymal signal intensity. 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 No other intrahepatic lesions are identified. No intrahepatic biliary ductal dilation. The gallbladder contains multiple gallstones. The gallbladder is thick walled. There is hyperemia of the gallbladder wall with  irregularity of the wall seen within the fundus suggesting areas of focal perforation, best appreciated on image # 18/5 and image #  47/19. There is extensive surrounding edema. Mild reactive hyperemia within the adjacent gallbladder fossa. Together, the findings are in keeping with acute perforated cholecystitis. No intra or extrahepatic biliary ductal dilation. No choledocholithiasis. Pancreas: No mass, inflammatory changes, or other parenchymal abnormality identified. Spleen: A 17 mm subcapsular lesion is seen within the lateral aspect of the spleen at axial image # 13/5 which is hypointense on T1 and T2 weighted imaging and demonstrates relative hypoenhancement and no restricted diffusion likely representing the sequela of remote trauma or inflammation or potentially a lesion such as a littoral cell angioma. An inflammatory granuloma of sarcoidosis is considered less likely given its solitary nature. Mild splenomegaly is present with the spleen measuring 14.2 cm in greatest dimension. Adrenals/Urinary Tract: Adrenal glands are unremarkable. The kidneys are normal in size and position. Multiple simple cortical and parapelvic cysts are seen bilaterally. No follow-up imaging is recommended for these lesions. Mild bilateral nonspecific perinephric edema is present. No enhancing intrarenal masses. No hydronephrosis. Stomach/Bowel: Small duodenal diverticulum. The visualized large and small bowel are otherwise unremarkable. Vascular/Lymphatic: Enlarged periportal lymph node seen at axial image # 17/5 likely represents reactive adenopathy related to the inflammatory process within the gallbladder. No additional pathologic adenopathy within the abdomen. The abdominal vasculature is unremarkable. Other:  None Musculoskeletal: Remote appearing L1 and L5 compression deformities are seen. Nonenhancing lesion within the L3 vertebral body may represent a small cyst or hemangioma. IMPRESSION: 1. MR findings compatible with acute perforated cholecystitis with multiple gallstones and focal perforation of the gallbladder fundus. 2. No evidence of  choledocholithiasis. 3. 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 4. 17 mm subcapsular lesion within the lateral aspect of the spleen likely representing the sequela of remote trauma or inflammation or potentially a lesion such as a littoral cell angioma. An inflammatory granuloma of sarcoidosis is considered less likely given its solitary nature. 5. Mild splenomegaly. 6. Remote appearing L1 and L5 compression deformities. Electronically Signed: By: Fidela Salisbury M.D. On: 01/21/2022 00:37   US Abdomen Limited RUQ (LIVER/GB)  Result Date: 01/20/2022 CLINICAL DATA:  Right upper quadrant pain. Recent abnormal appearance on PET EXAM: ULTRASOUND ABDOMEN LIMITED RIGHT UPPER QUADRANT COMPARISON:  PET CT 01/15/22 FINDINGS: Gallbladder: Gallbladder wall is markedly thickened measuring up to 1.1 cm. There is mild hyperemia surrounding the gallbladder. There are portions of the gallbladder wall that are difficult to visualized (series 1, image 14, 28, 29). There are multiple layering gallstones. Sonographic Murphy sign was negative per patient. Common bile duct: Diameter: 6 mm Liver: No focal lesion identified. Slightly increased parenchymal echogenicity. Portal vein is patent on color Doppler imaging with normal direction of blood flow towards the liver. Other: None. IMPRESSION: Cholelithiasis with markedly thickened gallbladder wall, compatible with severe acute cholecystitis. Portions of the gallbladder wall are difficult to visualize, which could suggest gangrenous cholecystitis. Electronically Signed   By: Marin Roberts M.D.   On: 01/20/2022 11:56    Anti-infectives: Anti-infectives (From admission, onward)    Start     Dose/Rate Route Frequency Ordered Stop   01/20/22 2000  piperacillin-tazobactam (ZOSYN) IVPB 3.375 g        3.375 g 12.5 mL/hr over 240 Minutes Intravenous Every 8 hours 01/20/22 1356     01/20/22 1300   piperacillin-tazobactam (ZOSYN) IVPB 3.375 g  3.375 g 100 mL/hr over 30 Minutes Intravenous  Once 01/20/22 1254 01/20/22 1406        Assessment/Plan Gangrenous, perforated cholecystitis S/p IR perc chole drain 11/2  Feeling a little worse right after IR drain.  Monitor closely   FEN - regular diet VTE - may resume DOAC when ok with IR ID - [email or web address]  I reviewed Consultant IR notes, hospitalist notes, last 24 h vitals and pain scores, last 48 h intake and output, last 24 h labs and trends, and last 24 h imaging results.   LOS: 2 days   Felicie Morn, [facility] 01/22/2022, 10:43 AM Please see Amion for pager number during day hours 7:00am-4:30pm or 7:00am -11:30am on weekends

## 2022-01-22 NOTE — Progress Notes (Signed)
Mobility Specialist - Progress Note   01/22/22 1038  Mobility  Activity Ambulated with assistance in hallway  Level of Assistance Contact guard assist, steadying assist  Assistive Device Cane  Distance Ambulated (ft) 200 ft  Activity Response Tolerated well  Mobility Referral Yes  $Mobility charge 1 Mobility   Pt received in bed and agreed to mobility, some discomfort in incision site did not label as pain. Pt had one instance of unsteadiness immediately when standing, pt back to bed with all needs met.   Roderick Pee Mobility Specialist

## 2022-01-22 NOTE — Progress Notes (Signed)
PROGRESS NOTE    Joe [name]  PYK:[id number] DOB: 13-May-1945 DOA: 01/20/2022 PCP: Cassandria Anger, MD    Brief Narrative:  76 year old gentleman with history of CLL, COPD, depression, paroxysmal A-fib, GERD, hypertension presented with right upper quadrant pain with chills and decreased appetite for 2 weeks.  Hemodynamically stable in the ER.  He was found to have mildly elevated transaminases, right upper quadrant ultrasound was consistent with acute cholecystitis with gallstone.  Seen by surgery.  Admitted with antibiotics and underwent percutaneous drainage by IR.   Assessment & Plan:   Acute cholecystitis with cholelithiasis.  Gallbladder abscess: Currently hemodynamically stable. Status post IR guided percutaneous cholecystostomy with purulent drainage. Blood cultures negative so far.  Surgical cultures with gram-negative rods.  Continue Zosyn today.   Continue maintenance IV fluids.  Advance diet.  Mobilize.  Adequate pain medications.  Surgery following.  If remains stable, anticipate home tomorrow to oral antibiotics.  Surgery and IR to schedule outpatient follow-up.  Chronic medical issues including Essential hypertension: Stable on amlodipine GERD: PPI CLL, stable Hypothyroidism: On levothyroxine 112 mcg. Paroxysmal a flutter: Sinus rhythm.  Resume Eliquis today. OSA on CPAP.  Does not want to wear.  Can come off telemetry.  Mobilize in the hallway.  Anticipate home tomorrow.   DVT prophylaxis: SCDs Start: 01/20/22 1357 apixaban (ELIQUIS) tablet 5 mg   Code Status: Full code Family Communication: None at the bedside Disposition Plan: Status is: Inpatient Remains inpatient appropriate because: Inpatient procedures, IV antibiotics     Consultants:  General surgery IR  Procedures:  Percutaneous cholecystostomy by IR 11/2  Antimicrobials:  Zosyn 11/1---   Subjective: Patient seen and examined.  Soreness remains in the epigastrium but he is overall  feeling better.  No trouble with eating.  Denies any nausea vomiting.  Passing flatus.  Asking for instructions for tube flushing.  Objective: Vitals:   01/21/22 1401 01/21/22 1952 01/21/22 2321 01/22/22 0501  BP: (!) 111/57 (!) 94/55 (!) 95/55 (!) 101/58  Pulse: (!) 58 63 64 60  Resp: 18 18 18 20   Temp: 97.8 F (36.6 C) 99 F (37.2 C) 98.6 F (37 C) 98.3 F (36.8 C)  TempSrc: Oral Oral Oral Oral  SpO2: 100% 100% 100% 100%  Weight:      Height:        Intake/Output Summary (Last 24 hours) at 01/22/2022 1118 Last data filed at 01/22/2022 1035 Gross per 24 hour  Intake 1340.6 ml  Output 1245 ml  Net 95.6 ml   Filed Weights   01/20/22 2009  Weight: 80.7 kg    Examination:  General exam: Appears calm and comfortable  Respiratory system: Clear to auscultation. Respiratory effort normal.  No added sounds.  Wearing 2 L oxygen. Cardiovascular system: S1 & S2 heard, RRR. No pedal edema. Gastrointestinal system: Soft.  Mild tenderness along the right upper quadrant and epigastrium.  Percutaneous tube draining free-flowing bile.  No rigidity or guarding. Central nervous system: Alert and oriented. No focal neurological deficits. Extremities: Symmetric 5 x 5 power. Skin: No rashes, lesions or ulcers Psychiatry: Judgement and insight appear normal. Mood & affect appropriate.     Data Reviewed: I have personally reviewed following labs and imaging studies  CBC: Recent Labs  Lab 01/20/22 1035 01/21/22 0511 01/22/22 0528  WBC 10.9* 11.1* 13.0*  NEUTROABS 8.1*  --   --   HGB 12.7* 11.4* 11.3*  HCT 39.2 36.7* 36.9*  MCV 91.6 93.1 93.7  PLT 318 337 340  Basic Metabolic Panel: Recent Labs  Lab 01/20/22 1035 01/21/22 0723 01/22/22 0528  NA 137 142 138  K 3.5 4.6 3.9  CL 103 106 103  CO2 28 31 29   GLUCOSE 103* 99 119*  BUN 9 8 9   CREATININE 0.72 0.78 0.81  CALCIUM 8.8* 8.8* 8.0*  MG 2.1  --   --   PHOS 3.3  --   --    GFR: Estimated Creatinine Clearance: 83.9  mL/min (by C-G formula based on SCr of 0.81 mg/dL). Liver Function Tests: Recent Labs  Lab 01/20/22 1035 01/21/22 0723 01/22/22 0528  AST 43* 41 33  ALT 64* 54* 44  ALKPHOS 159* 130* 106  BILITOT 2.0* 1.5* 1.5*  PROT 6.9 6.1* 5.8*  ALBUMIN 2.7* 2.4* 2.3*   Recent Labs  Lab 01/20/22 1035  LIPASE 73*   No results for input(s): "AMMONIA" in the last 168 hours. Coagulation Profile: Recent Labs  Lab 01/20/22 2127  INR 1.3*   Cardiac Enzymes: No results for input(s): "CKTOTAL", "CKMB", "CKMBINDEX", "TROPONINI" in the last 168 hours. BNP (last 3 results) No results for input(s): "PROBNP" in the last 8760 hours. HbA1C: No results for input(s): "HGBA1C" in the last 72 hours. CBG: Recent Labs  Lab 01/15/22 1301  GLUCAP 89   Lipid Profile: No results for input(s): "CHOL", "HDL", "LDLCALC", "TRIG", "CHOLHDL", "LDLDIRECT" in the last 72 hours. Thyroid Function Tests: No results for input(s): "TSH", "T4TOTAL", "FREET4", "T3FREE", "THYROIDAB" in the last 72 hours. Anemia Panel: No results for input(s): "VITAMINB12", "FOLATE", "FERRITIN", "TIBC", "IRON", "RETICCTPCT" in the last 72 hours. Sepsis Labs: Recent Labs  Lab 01/20/22 1035 01/20/22 1218  LATICACIDVEN 0.8 0.8    Recent Results (from the past 240 hour(s))  Culture, blood (routine x 2)     Status: None (Preliminary result)   Collection Time: 01/20/22 10:35 AM   Specimen: BLOOD  Result Value Ref Range Status   Specimen Description   Final    BLOOD BLOOD LEFT FOREARM Performed at [facility] [address], [place], [place] [address]    Special Requests   Final    BOTTLES DRAWN AEROBIC AND ANAEROBIC Blood Culture adequate volume Performed at [facility] [address], [place], [place] [address]    Culture   Final    NO GROWTH 2 DAYS Performed at [facility] [address] [place], [place] [address]    Report Status PENDING  Incomplete  Culture, blood  (routine x 2)     Status: None (Preliminary result)   Collection Time: 01/20/22 10:39 AM   Specimen: BLOOD  Result Value Ref Range Status   Specimen Description   Final    BLOOD LEFT ANTECUBITAL Performed at [facility] [address] [place], [place] [address]    Special Requests   Final    BOTTLES DRAWN AEROBIC AND ANAEROBIC Blood Culture adequate volume Performed at [facility] [address], [place], [place] [address]    Culture   Final    NO GROWTH 2 DAYS Performed at [facility] [address], [place],  [address]    Report Status PENDING  Incomplete  Urine Culture     Status: None   Collection Time: 01/20/22 11:40 AM   Specimen: Urine, Clean Catch  Result Value Ref Range Status   Specimen Description   Final    URINE, CLEAN CATCH Performed at [facility], [place] [address] [place], [place]  [address]    Special Requests   Final    NONE Performed at [facility], [place] [address], [place], [place] [address]    Culture   Final    NO GROWTH Performed at [facility], [place] [address] [place], [place] [address]    Report Status 01/21/2022 FINAL  Final  Aerobic/Anaerobic Culture w Gram Stain (surgical/deep wound)     Status: None (Preliminary result)   Collection Time: 01/21/22  9:02 AM   Specimen: Gallbladder; Abscess  Result Value Ref Range Status   Specimen Description   Final    GALL BLADDER ABSCESS Performed at [facility] [address] [place] [address], [place],  [address]    Special Requests   Final    NONE Performed at [facility], [place] [address], [place],  [address]    Gram Stain   Final    ABUNDANT WBC PRESENT, PREDOMINANTLY PMN RARE GRAM NEGATIVE RODS    Culture   Final    ABUNDANT ESCHERICHIA COLI SUSCEPTIBILITIES TO FOLLOW Performed at [facility], [place] [address], [place],  [address]    Report  Status PENDING  Incomplete         Radiology Studies: IR Perc Cholecystostomy  Result Date: 01/21/2022 INDICATION: 76 year old with history of acute calculus cholecystitis, poor surgical candidate. EXAM: Ultrasound and fluoroscopic guided percutaneous cholecystostomy tube placement MEDICATIONS: The patient was receiving intravenous antibiotics as an inpatient.; The antibiotics were administered within an appropriate time frame prior to the initiation of the procedure. ANESTHESIA/SEDATION: Moderate (conscious) sedation was employed during this procedure. A total of Versed 2 mg and Fentanyl 100 mcg was administered intravenously. Moderate Sedation Time: 10 minutes. The patient's level of consciousness and vital signs were monitored continuously by radiology nursing throughout the procedure under my direct supervision. FLUOROSCOPY TIME:  Four mGy COMPLICATIONS: None immediate. PROCEDURE: Informed written consent was obtained from the patient after a thorough discussion of the procedural risks, benefits and alternatives. All questions were addressed. Maximal Sterile Barrier Technique was utilized including caps, mask, sterile gowns, sterile gloves, sterile drape, hand hygiene and skin antiseptic. A timeout was performed prior to the initiation of the procedure. The patient was placed supine on the angiographic table. The patient's right upper quadrant was then prepped and draped in normal sterile fashion with maximum sterile barrier. Ultrasound demonstrates a distended gallbladder with multiple echogenic gallstones. Subdermal Local anesthesia was provided at the planned skin entry site. Under ultrasound guidance, deeper local anesthetic was provided through intercostal muscles and along the liver capsule. Ultrasound was used to puncture the gallbladder using a 18 gauge trocar needle via a subcostal transhepatic approach with visualization of the lung treated to the gallbladder. There was efflux of thick,  purulent fluid when the stylet was removed. A 0.035 inch exchange wire was placed in the tract was dilated. A 10.2 French multipurpose drainage catheter was advanced into the gallbladder lumen. The drain was then secured in place using a 0-silk suture and a Stayfix device. Approximately 40 of purulent fluid was aspirated and a sample sent culture. A sterile dressing was applied. The tube was placed to bulb suction. The patient tolerated procedure well without evidence of immediate complication was transferred back to the floor in stable condition. IMPRESSION: Successful placement of 10.2 French percutaneous, subcostal transhepatic cholecystostomy tube. PLAN: If the patient is deemed a poor surgical candidate indefinitely, he would likely benefit from percutaneous choledochoscopic (Spyglass) gallstone retrieval and hopefully eventual tube removal.  IR will arrange for 8 week follow-up cholecystostomy tube check and exchange and will discuss further plan with the patient at that time. Ruthann Cancer, MD [facility] Radiology Electronically Signed   By: Ruthann Cancer M.D.   On: 01/21/2022 09:24   MR ABDOMEN MRCP W WO CONTAST  Addendum Date: 01/21/2022   ADDENDUM REPORT: 01/21/2022 01:14 ADDENDUM: In regards to the splenic lesion, while this may reflect the sequela of remote trauma or inflammation or a nonaggressive lesion such as a littoral cell angioma, low-grade lymphoma should also be considered in the differential despite its lack of hypermetabolism on PET CT examination of 01/15/2022 and lack of significant restricted diffusion. In absence of prior contrast enhanced examinations, a follow-up examination in 6-12 months may be helpful to ensure continued stability. Electronically Signed   By: Fidela Salisbury M.D.   On: 01/21/2022 01:14   Result Date: 01/21/2022 CLINICAL DATA:  Cholelithiasis, choledocholithiasis EXAM: MRI ABDOMEN WITHOUT AND WITH CONTRAST (INCLUDING  MRCP) TECHNIQUE: Multiplanar multisequence MR imaging of the abdomen was performed both before and after the administration of intravenous contrast. Heavily T2-weighted images of the biliary and pancreatic ducts were obtained, and three-dimensional MRCP images were rendered by post processing. CONTRAST:  3mL GADAVIST GADOBUTROL 1 MMOL/ML IV SOLN COMPARISON:  None Available. FINDINGS: Lower chest: No acute findings. Hepatobiliary: Normal hepatic parenchymal signal intensity. 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 No other intrahepatic lesions are identified. No intrahepatic biliary ductal dilation. The gallbladder contains multiple gallstones. The gallbladder is thick walled. There is hyperemia of the gallbladder wall with irregularity of the wall seen within the fundus suggesting areas of focal perforation, best appreciated on image # 18/5 and image # 47/19. There is extensive surrounding edema. Mild reactive hyperemia within the adjacent gallbladder fossa. Together, the findings are in keeping with acute perforated cholecystitis. No intra or extrahepatic biliary ductal dilation. No choledocholithiasis. Pancreas: No mass, inflammatory changes, or other parenchymal abnormality identified. Spleen: A 17 mm subcapsular lesion is seen within the lateral aspect of the spleen at axial image # 13/5 which is hypointense on T1 and T2 weighted imaging and demonstrates relative hypoenhancement and no restricted diffusion likely representing the sequela of remote trauma or inflammation or potentially a lesion such as a littoral cell angioma. An inflammatory granuloma of sarcoidosis is considered less likely given its solitary nature. Mild splenomegaly is present with the spleen  measuring 14.2 cm in greatest dimension. Adrenals/Urinary Tract: Adrenal glands are unremarkable. The kidneys are normal in size and position. Multiple simple cortical and parapelvic cysts are seen bilaterally. No follow-up imaging is recommended for these lesions. Mild bilateral nonspecific perinephric edema is present. No enhancing intrarenal masses. No hydronephrosis. Stomach/Bowel: Small duodenal diverticulum. The visualized large and small bowel are otherwise unremarkable. Vascular/Lymphatic: Enlarged periportal lymph node seen at axial image # 17/5 likely represents reactive adenopathy related to the inflammatory process within the gallbladder. No additional pathologic adenopathy within the abdomen. The abdominal vasculature is unremarkable. Other:  None Musculoskeletal: Remote appearing L1 and L5 compression deformities are seen. Nonenhancing lesion within the L3 vertebral body may represent a small cyst or hemangioma. IMPRESSION: 1. MR findings compatible with acute perforated cholecystitis with multiple gallstones and focal perforation of the gallbladder fundus. 2. No evidence of  choledocholithiasis. 3. 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 4. 17 mm subcapsular lesion within the lateral aspect of the spleen likely representing the sequela of remote trauma or inflammation or potentially a lesion such as a littoral cell angioma. An inflammatory granuloma of sarcoidosis is considered less likely given its solitary nature. 5. Mild splenomegaly. 6. Remote appearing L1 and L5 compression deformities. Electronically Signed: By: Fidela Salisbury M.D. On: 01/21/2022 00:37   MR 3D Recon At Scanner  Addendum Date: 01/21/2022   ADDENDUM REPORT: 01/21/2022 01:14 ADDENDUM: In regards to the splenic lesion, while this may reflect the sequela of remote trauma or inflammation or a nonaggressive lesion such as a littoral cell angioma, low-grade  lymphoma should also be considered in the differential despite its lack of hypermetabolism on PET CT examination of 01/15/2022 and lack of significant restricted diffusion. In absence of prior contrast enhanced examinations, a follow-up examination in 6-12 months may be helpful to ensure continued stability. Electronically Signed   By: Fidela Salisbury M.D.   On: 01/21/2022 01:14   Result Date: 01/21/2022 CLINICAL DATA:  Cholelithiasis, choledocholithiasis EXAM: MRI ABDOMEN WITHOUT AND WITH CONTRAST (INCLUDING MRCP) TECHNIQUE: Multiplanar multisequence MR imaging of the abdomen was performed both before and after the administration of intravenous contrast. Heavily T2-weighted images of the biliary and pancreatic ducts were obtained, and three-dimensional MRCP images were rendered by post processing. CONTRAST:  51mL GADAVIST GADOBUTROL 1 MMOL/ML IV SOLN COMPARISON:  None Available. FINDINGS: Lower chest: No acute findings. Hepatobiliary: Normal hepatic parenchymal signal intensity. 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 No other intrahepatic lesions are identified. No intrahepatic biliary ductal dilation. The gallbladder contains multiple gallstones. The gallbladder is thick walled. There is hyperemia of the gallbladder wall with irregularity of the wall seen within the fundus suggesting areas of focal perforation, best appreciated on image # 18/5 and image # 47/19. There is extensive surrounding edema. Mild reactive hyperemia within the adjacent gallbladder fossa. Together, the findings are in keeping with acute perforated cholecystitis. No intra or extrahepatic biliary ductal dilation. No choledocholithiasis. Pancreas: No mass, inflammatory changes, or other  parenchymal abnormality identified. Spleen: A 17 mm subcapsular lesion is seen within the lateral aspect of the spleen at axial image # 13/5 which is hypointense on T1 and T2 weighted imaging and demonstrates relative hypoenhancement and no restricted diffusion likely representing the sequela of remote trauma or inflammation or potentially a lesion such as a littoral cell angioma. An inflammatory granuloma of sarcoidosis is considered less likely given its solitary nature. Mild splenomegaly is present with the spleen measuring 14.2 cm in greatest dimension. Adrenals/Urinary Tract: Adrenal glands are unremarkable. The kidneys are normal in size and position. Multiple simple cortical and parapelvic cysts are seen bilaterally. No follow-up imaging is recommended for these lesions. Mild bilateral nonspecific perinephric edema is present. No enhancing intrarenal masses. No hydronephrosis. Stomach/Bowel: Small duodenal diverticulum. The visualized large and small bowel are otherwise unremarkable. Vascular/Lymphatic: Enlarged periportal lymph node seen at axial image # 17/5 likely represents reactive adenopathy related to the inflammatory process within the gallbladder. No additional pathologic adenopathy within the abdomen. The abdominal vasculature is unremarkable. Other:  None Musculoskeletal: Remote appearing L1 and L5 compression deformities are seen.  Nonenhancing lesion within the L3 vertebral body may represent a small cyst or hemangioma. IMPRESSION: 1. MR findings compatible with acute perforated cholecystitis with multiple gallstones and focal perforation of the gallbladder fundus. 2. No evidence of choledocholithiasis. 3. 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 4. 17 mm subcapsular lesion within the lateral aspect of the spleen likely representing the sequela of remote trauma or inflammation or potentially a lesion such as a littoral cell  angioma. An inflammatory granuloma of sarcoidosis is considered less likely given its solitary nature. 5. Mild splenomegaly. 6. Remote appearing L1 and L5 compression deformities. Electronically Signed: By: Fidela Salisbury M.D. On: 01/21/2022 00:37   US Abdomen Limited RUQ (LIVER/GB)  Result Date: 01/20/2022 CLINICAL DATA:  Right upper quadrant pain. Recent abnormal appearance on PET EXAM: ULTRASOUND ABDOMEN LIMITED RIGHT UPPER QUADRANT COMPARISON:  PET CT 01/15/22 FINDINGS: Gallbladder: Gallbladder wall is markedly thickened measuring up to 1.1 cm. There is mild hyperemia surrounding the gallbladder. There are portions of the gallbladder wall that are difficult to visualized (series 1, image 14, 28, 29). There are multiple layering gallstones. Sonographic Murphy sign was negative per patient. Common bile duct: Diameter: 6 mm Liver: No focal lesion identified. Slightly increased parenchymal echogenicity. Portal vein is patent on color Doppler imaging with normal direction of blood flow towards the liver. Other: None. IMPRESSION: Cholelithiasis with markedly thickened gallbladder wall, compatible with severe acute cholecystitis. Portions of the gallbladder wall are difficult to visualize, which could suggest gangrenous cholecystitis. Electronically Signed   By: Marin Roberts M.D.   On: 01/20/2022 11:56        Scheduled Meds:  amLODipine  5 mg Oral Daily   apixaban  5 mg Oral BID   levothyroxine  112 mcg Oral Daily   sodium chloride flush  5 mL Intracatheter Q8H   Continuous Infusions:  piperacillin-tazobactam Stopped (01/22/22 0919)     LOS: 2 days    Time spent: 35 minutes    Barb Merino, MD [facility] Pager [phone]

## 2022-01-22 NOTE — Progress Notes (Signed)
Referring Physician(s): Obie Dredge, PA-C  Supervising Physician: Michaelle Birks  Patient Status:  [facility] - In-pt  Chief Complaint: Acute calculus cholecystitis s/p percutaneous cholecystectomy 01/21/22 by Dr. Serafina Royals  Subjective: Patient in bed watching TV. No apparent distress/discomfort. He states he feels better compared to yesterday.   Allergies: Patient has no known allergies.  Medications: Prior to Admission medications   Medication Sig Start Date End Date Taking? Authorizing Provider  albuterol (PROVENTIL) (2.5 MG/3ML) 0.083% nebulizer solution Take 3 mLs (2.5 mg total) by nebulization every 6 (six) hours as needed for wheezing or shortness of breath. 03/04/21  Yes Maryjane Hurter, MD  amLODipine (NORVASC) 5 MG tablet TAKE 1 TABLET BY MOUTH EVERY DAY Patient taking differently: Take 5 mg by mouth daily. 11/09/21  Yes [name], [name], MD  apixaban (ELIQUIS) 5 MG TABS tablet TAKE 1 TABLET(5 MG) BY MOUTH TWICE DAILY Patient taking differently: Take 5 mg by mouth 2 (two) times daily. 09/25/21  Yes Turner, Eber Hong, MD  atenolol (TENORMIN) 25 MG tablet Take 1 tablet (25 mg total) by mouth daily as needed (palpitations). Patient taking differently: Take 25 mg by mouth daily as needed (prolonged tachycardia). 07/29/21  Yes [name], [name], MD  Cholecalciferol 25 MCG (1000 UT) tablet Take 1,000 Units by mouth daily.   Yes [provider]  clotrimazole-betamethasone (LOTRISONE) cream Apply topically 2 (two) times daily. Patient taking differently: Apply 1 Application topically 2 (two) times daily as needed (nail fungus). 12/24/20  Yes [name], [name], MD  diazepam (VALIUM) 5 MG tablet Take 1 tablet (5 mg total) by mouth every 12 (twelve) hours as needed for anxiety. Patient taking differently: Take 5 mg by mouth every 12 (twelve) hours as needed for anxiety (sleep). 07/29/21  Yes [name], [name], MD  levothyroxine (SYNTHROID) 112 MCG tablet TAKE 1 TABLET(112  MCG) BY MOUTH DAILY Patient taking differently: Take 112 mcg by mouth daily. 11/09/21  Yes Heilingoetter, Cassandra L, PA-C  lovastatin (MEVACOR) 20 MG tablet TAKE 1 TABLET BY MOUTH EVERY NIGHT AT BEDTIME Patient taking differently: Take 20 mg by mouth at bedtime. 08/03/21  Yes [name], [name], MD  Multiple Vitamin (MULTIVITAMIN) tablet Take 1 tablet by mouth daily. Centrum Silver.   Yes [provider]  Polyethyl Glycol-Propyl Glycol (SYSTANE ULTRA OP) Place 1 drop into both eyes 2 (two) times daily as needed (dry eyes).   Yes [provider]     Vital Signs: BP (!) 101/58 (BP Location: Right Arm)   Pulse 60   Temp 98.3 F (36.8 C) (Oral)   Resp 20   Ht 5\' 11"  (1.803 m)   Wt 178 lb (80.7 kg)   SpO2 100%   BMI 24.83 kg/m   Physical Exam Constitutional:      General: He is not in acute distress. HENT:     Mouth/Throat:     Mouth: Mucous membranes are moist.     Pharynx: Oropharynx is clear.  Pulmonary:     Effort: Pulmonary effort is normal.  Abdominal:     Palpations: Abdomen is soft.     Tenderness: There is abdominal tenderness.     Comments: RUQ drain to suction. Dressing is clean/dry/intact. Drain easily flushed with 5 ml NS. Approximately 15 ml of serosanguineous fluid in bulb.   Skin:    General: Skin is warm and dry.  Neurological:     Mental Status: He is alert and oriented to person, place, and time.     Imaging:  IR Perc Cholecystostomy  Result Date: 01/21/2022 INDICATION: 76 year old with history of acute calculus cholecystitis, poor surgical candidate. EXAM: Ultrasound and fluoroscopic guided percutaneous cholecystostomy tube placement MEDICATIONS: The patient was receiving intravenous antibiotics as an inpatient.; The antibiotics were administered within an appropriate time frame prior to the initiation of the procedure. ANESTHESIA/SEDATION: Moderate (conscious) sedation was employed during this procedure. A total of Versed 2 mg and  Fentanyl 100 mcg was administered intravenously. Moderate Sedation Time: 10 minutes. The patient's level of consciousness and vital signs were monitored continuously by radiology nursing throughout the procedure under my direct supervision. FLUOROSCOPY TIME:  Four mGy COMPLICATIONS: None immediate. PROCEDURE: Informed written consent was obtained from the patient after a thorough discussion of the procedural risks, benefits and alternatives. All questions were addressed. Maximal Sterile Barrier Technique was utilized including caps, mask, sterile gowns, sterile gloves, sterile drape, hand hygiene and skin antiseptic. A timeout was performed prior to the initiation of the procedure. The patient was placed supine on the angiographic table. The patient's right upper quadrant was then prepped and draped in normal sterile fashion with maximum sterile barrier. Ultrasound demonstrates a distended gallbladder with multiple echogenic gallstones. Subdermal Local anesthesia was provided at the planned skin entry site. Under ultrasound guidance, deeper local anesthetic was provided through intercostal muscles and along the liver capsule. Ultrasound was used to puncture the gallbladder using a 18 gauge trocar needle via a subcostal transhepatic approach with visualization of the lung treated to the gallbladder. There was efflux of thick, purulent fluid when the stylet was removed. A 0.035 inch exchange wire was placed in the tract was dilated. A 10.2 French multipurpose drainage catheter was advanced into the gallbladder lumen. The drain was then secured in place using a 0-silk suture and a Stayfix device. Approximately 40 of purulent fluid was aspirated and a sample sent culture. A sterile dressing was applied. The tube was placed to bulb suction. The patient tolerated procedure well without evidence of immediate complication was transferred back to the floor in stable condition. IMPRESSION: Successful placement of 10.2 French  percutaneous, subcostal transhepatic cholecystostomy tube. PLAN: If the patient is deemed a poor surgical candidate indefinitely, he would likely benefit from percutaneous choledochoscopic (Spyglass) gallstone retrieval and hopefully eventual tube removal. IR will arrange for 8 week follow-up cholecystostomy tube check and exchange and will discuss further plan with the patient at that time. Ruthann Cancer, MD [facility] Electronically Signed   By: Ruthann Cancer M.D.   On: 01/21/2022 09:24   MR ABDOMEN MRCP W WO CONTAST  Addendum Date: 01/21/2022   ADDENDUM REPORT: 01/21/2022 01:14 ADDENDUM: In regards to the splenic lesion, while this may reflect the sequela of remote trauma or inflammation or a nonaggressive lesion such as a littoral cell angioma, low-grade lymphoma should also be considered in the differential despite its lack of hypermetabolism on PET CT examination of 01/15/2022 and lack of significant restricted diffusion. In absence of prior contrast enhanced examinations, a follow-up examination in 6-12 months may be helpful to ensure continued stability. Electronically Signed   By: Fidela Salisbury M.D.   On: 01/21/2022 01:14   Result Date: 01/21/2022 CLINICAL DATA:  Cholelithiasis, choledocholithiasis EXAM: MRI ABDOMEN WITHOUT AND WITH CONTRAST (INCLUDING MRCP) TECHNIQUE: Multiplanar multisequence MR imaging of the abdomen was performed both before and after the administration of intravenous contrast. Heavily T2-weighted images of the biliary and pancreatic ducts were obtained, and three-dimensional MRCP images were rendered by post processing. CONTRAST:  55mL GADAVIST GADOBUTROL 1 MMOL/ML IV SOLN COMPARISON:  None Available. FINDINGS: Lower chest: No acute findings. Hepatobiliary: Normal hepatic parenchymal signal intensity. 10 mm enhancing lesion within the inferior right hepatic lobe, axial image # 62/15, is mildly hypointense on T1 weighted  imaging, hyperintense on T2 weighted imaging, and demonstrates early arterial filling with no significant washout and most likely represents a small flash fill hemangioma. No other intrahepatic lesions are identified. No intrahepatic biliary ductal dilation. The gallbladder contains multiple gallstones. The gallbladder is thick walled. There is hyperemia of the gallbladder wall with irregularity of the wall seen within the fundus suggesting areas of focal perforation, best appreciated on image # 18/5 and image # 47/19. There is extensive surrounding edema. Mild reactive hyperemia within the adjacent gallbladder fossa. Together, the findings are in keeping with acute perforated cholecystitis. No intra or extrahepatic biliary ductal dilation. No choledocholithiasis. Pancreas: No mass, inflammatory changes, or other parenchymal abnormality identified. Spleen: A 17 mm subcapsular lesion is seen within the lateral aspect of the spleen at axial image # 13/5 which is hypointense on T1 and T2 weighted imaging and demonstrates relative hypoenhancement and no restricted diffusion likely representing the sequela of remote trauma or inflammation or potentially a lesion such as a littoral cell angioma. An inflammatory granuloma of sarcoidosis is considered less likely given its solitary nature. Mild splenomegaly is present with the spleen measuring 14.2 cm in greatest dimension. Adrenals/Urinary Tract: Adrenal glands are unremarkable. The kidneys are normal in size and position. Multiple simple cortical and parapelvic cysts are seen bilaterally. No follow-up imaging is recommended for these lesions. Mild bilateral nonspecific perinephric edema is present. No enhancing intrarenal masses. No hydronephrosis. Stomach/Bowel: Small duodenal diverticulum. The visualized large and small bowel are otherwise unremarkable. Vascular/Lymphatic: Enlarged periportal lymph node seen at axial image # 17/5 likely represents reactive adenopathy  related to the inflammatory process within the gallbladder. No additional pathologic adenopathy within the abdomen. The abdominal vasculature is unremarkable. Other:  None Musculoskeletal: Remote appearing L1 and L5 compression deformities are seen. Nonenhancing lesion within the L3 vertebral body may represent a small cyst or hemangioma. IMPRESSION: 1. MR findings compatible with acute perforated cholecystitis with multiple gallstones and focal perforation of the gallbladder fundus. 2. No evidence of choledocholithiasis. 3. 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 4. 17 mm subcapsular lesion within the lateral aspect of the spleen likely representing the sequela of remote trauma or inflammation or potentially a lesion such as a littoral cell angioma. An inflammatory granuloma of sarcoidosis is considered less likely given its solitary nature. 5. Mild splenomegaly. 6. Remote appearing L1 and L5 compression deformities. Electronically Signed: By: Fidela Salisbury M.D. On: 01/21/2022 00:37   MR 3D Recon At Scanner  Addendum Date: 01/21/2022   ADDENDUM REPORT: 01/21/2022 01:14 ADDENDUM: In regards to the splenic lesion, while this may reflect the sequela of remote trauma or inflammation or a nonaggressive lesion such as a littoral cell angioma, low-grade lymphoma should also be considered in the differential despite its lack of hypermetabolism on PET CT examination of 01/15/2022 and lack of significant restricted diffusion. In absence of prior contrast enhanced examinations, a follow-up examination in 6-12 months may be helpful to ensure continued stability. Electronically Signed   By: Fidela Salisbury M.D.   On: 01/21/2022 01:14   Result Date: 01/21/2022 CLINICAL DATA:  Cholelithiasis, choledocholithiasis EXAM: MRI ABDOMEN WITHOUT AND WITH CONTRAST (INCLUDING MRCP) TECHNIQUE: Multiplanar multisequence MR  imaging of the abdomen was performed  both before and after the administration of intravenous contrast. Heavily T2-weighted images of the biliary and pancreatic ducts were obtained, and three-dimensional MRCP images were rendered by post processing. CONTRAST:  50mL GADAVIST GADOBUTROL 1 MMOL/ML IV SOLN COMPARISON:  None Available. FINDINGS: Lower chest: No acute findings. Hepatobiliary: Normal hepatic parenchymal signal intensity. 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 No other intrahepatic lesions are identified. No intrahepatic biliary ductal dilation. The gallbladder contains multiple gallstones. The gallbladder is thick walled. There is hyperemia of the gallbladder wall with irregularity of the wall seen within the fundus suggesting areas of focal perforation, best appreciated on image # 18/5 and image # 47/19. There is extensive surrounding edema. Mild reactive hyperemia within the adjacent gallbladder fossa. Together, the findings are in keeping with acute perforated cholecystitis. No intra or extrahepatic biliary ductal dilation. No choledocholithiasis. Pancreas: No mass, inflammatory changes, or other parenchymal abnormality identified. Spleen: A 17 mm subcapsular lesion is seen within the lateral aspect of the spleen at axial image # 13/5 which is hypointense on T1 and T2 weighted imaging and demonstrates relative hypoenhancement and no restricted diffusion likely representing the sequela of remote trauma or inflammation or potentially a lesion such as a littoral cell angioma. An inflammatory granuloma of sarcoidosis is considered less likely given its solitary nature. Mild splenomegaly is present with the spleen measuring 14.2 cm in greatest dimension. Adrenals/Urinary Tract: Adrenal glands are  unremarkable. The kidneys are normal in size and position. Multiple simple cortical and parapelvic cysts are seen bilaterally. No follow-up imaging is recommended for these lesions. Mild bilateral nonspecific perinephric edema is present. No enhancing intrarenal masses. No hydronephrosis. Stomach/Bowel: Small duodenal diverticulum. The visualized large and small bowel are otherwise unremarkable. Vascular/Lymphatic: Enlarged periportal lymph node seen at axial image # 17/5 likely represents reactive adenopathy related to the inflammatory process within the gallbladder. No additional pathologic adenopathy within the abdomen. The abdominal vasculature is unremarkable. Other:  None Musculoskeletal: Remote appearing L1 and L5 compression deformities are seen. Nonenhancing lesion within the L3 vertebral body may represent a small cyst or hemangioma. IMPRESSION: 1. MR findings compatible with acute perforated cholecystitis with multiple gallstones and focal perforation of the gallbladder fundus. 2. No evidence of choledocholithiasis. 3. 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 4. 17 mm subcapsular lesion within the lateral aspect of the spleen likely representing the sequela of remote trauma or inflammation or potentially a lesion such as a littoral cell angioma. An inflammatory granuloma of sarcoidosis is considered less likely given its solitary nature. 5. Mild splenomegaly. 6. Remote appearing L1 and L5 compression deformities. Electronically Signed: By: Fidela Salisbury M.D. On: 01/21/2022 00:37   US Abdomen Limited RUQ (LIVER/GB)  Result Date: 01/20/2022 CLINICAL DATA:  Right upper quadrant pain. Recent abnormal appearance on PET EXAM: ULTRASOUND ABDOMEN LIMITED RIGHT UPPER QUADRANT COMPARISON:  PET CT 01/15/22 FINDINGS: Gallbladder: Gallbladder wall is markedly thickened measuring up to 1.1 cm. There is mild hyperemia surrounding the  gallbladder. There are portions of the gallbladder wall that are difficult to visualized (series 1, image 14, 28, 29). There are multiple layering gallstones. Sonographic Murphy sign was negative per patient. Common bile duct: Diameter: 6 mm  Liver: No focal lesion identified. Slightly increased parenchymal echogenicity. Portal vein is patent on color Doppler imaging with normal direction of blood flow towards the liver. Other: None. IMPRESSION: Cholelithiasis with markedly thickened gallbladder wall, compatible with severe acute cholecystitis. Portions of the gallbladder wall are difficult to visualize, which could suggest gangrenous cholecystitis. Electronically Signed   By: Marin Roberts M.D.   On: 01/20/2022 11:56    Labs:  CBC: Recent Labs    12/28/21 1219 01/20/22 1035 01/21/22 0511 01/22/22 0528  WBC 10.4 10.9* 11.1* 13.0*  HGB 15.3 12.7* 11.4* 11.3*  HCT 47.1 39.2 36.7* 36.9*  PLT 267 318 337 340    COAGS: Recent Labs    01/20/22 2127  INR 1.3*    BMP: Recent Labs    12/28/21 1219 01/20/22 1035 01/21/22 0723 01/22/22 0528  NA 140 137 142 138  K 3.9 3.5 4.6 3.9  CL 103 103 106 103  CO2 33* 28 31 29   GLUCOSE 94 103* 99 119*  BUN 11 9 8 9   CALCIUM 9.5 8.8* 8.8* 8.0*  CREATININE 0.68 0.72 0.78 0.81  GFRNONAA >60 >60 >60 >60    LIVER FUNCTION TESTS: Recent Labs    12/28/21 1219 01/20/22 1035 01/21/22 0723 01/22/22 0528  BILITOT 1.3* 2.0* 1.5* 1.5*  AST 21 43* 41 33  ALT 17 64* 54* 44  ALKPHOS 87 159* 130* 106  PROT 7.4 6.9 6.1* 5.8*  ALBUMIN 4.0 2.7* 2.4* 2.3*    Assessment and Plan:  Acute calculous cholecystitis s/p percutaneous cholecystostomy 01/21/22 by Dr. Serafina Royals  Patient is afebrile with slightly elevated WBC today. He is feeling better compared to yesterday but has some discomfort around the drain insertion site. Approximately 15 ml of serosanguineous fluid in bulb.   Drain Location: RUQ Size: Fr size: 10 Fr Date of placement: 01/21/22   Currently to: suction 24 hour output:  Output by Drain (mL) 01/20/22 0700 - 01/20/22 1459 01/20/22 1500 - 01/20/22 2259 01/20/22 2300 - 01/21/22 0659 01/21/22 0700 - 01/21/22 1459 01/21/22 1500 - 01/21/22 2259 01/21/22 2300 - 01/22/22 0659 01/22/22 0700 - 01/22/22 1038  Closed System Drain Medial RUQ Bulb (JP) 10.2 Fr.  35    20     Interval imaging/drain manipulation:  None  Current examination: Flushes/aspirates easily.  Insertion site unremarkable. Suture and stat lock in place. Dressed appropriately.   Plan: Flush drain with 5-10 ml NS each shift. Document the drain output each shift. Change the dressing daily or as needed.   Discharge planning: Percutaneous cholecystostomy drain to remain in place at least 6 weeks. Recommend fluoroscopy with injection of the drain in IR to evaluate for patency of the cystic duct. If the duct is patent and general surgery feels patient is stable for cholecystectomy, the drain would be removed at time of surgery. If the duct is patent and general surgery feels patient is NEVER a candidate for cholecystectomy, drain can be capped for a trial. If symptoms recur, then place to gravity bag again. If trial is successful, discuss possible removal of the drain.  ** Outpatient orders in place - patient will follow up with Dr. Serafina Royals in approximately 8 weeks. A scheduler from our office will call the patient with a date/time of his appointment.   Drain care teaching done with the patient today. Patient knows to flush the drain once daily with 5 ml NS, empty the bulb regularly and document the output, to change the dressing daily or as needed and to keep  the site clean and dry. He has the number to the IR APP office at [facility] - he knows he can calls our office with any questions/concerns.   IR will continue to follow - please call with questions or concerns.  Electronically Signed: Soyla Dryer, AGACNP-BC [phone] 01/22/2022, 10:04 AM   I spent a total  of 15 Minutes at the the patient's bedside AND on the patient's hospital floor or unit, greater than 50% of which was counseling/coordinating care for percutaneous cholecystostomy

## 2022-01-23 ENCOUNTER — Inpatient Hospital Stay (HOSPITAL_COMMUNITY): Payer: Medicare Other

## 2022-01-23 DIAGNOSIS — K810 Acute cholecystitis: Secondary | ICD-10-CM | POA: Diagnosis not present

## 2022-01-23 LAB — COMPREHENSIVE METABOLIC PANEL
ALT: 38 U/L (ref 0–44)
AST: 27 U/L (ref 15–41)
Albumin: 2.3 g/dL — ABNORMAL LOW (ref 3.5–5.0)
Alkaline Phosphatase: 90 U/L (ref 38–126)
Anion gap: 5 (ref 5–15)
BUN: 8 mg/dL (ref 8–23)
CO2: 31 mmol/L (ref 22–32)
Calcium: 8.2 mg/dL — ABNORMAL LOW (ref 8.9–10.3)
Chloride: 99 mmol/L (ref 98–111)
Creatinine, Ser: 0.72 mg/dL (ref 0.61–1.24)
GFR, Estimated: >60 mL/min (ref >60)
Glucose, Bld: 116 mg/dL — ABNORMAL HIGH (ref 70–99)
Potassium: 3.6 mmol/L (ref 3.5–5.1)
Sodium: 135 mmol/L (ref 135–145)
Total Bilirubin: 1.2 mg/dL (ref 0.3–1.2)
Total Protein: 5.9 g/dL — ABNORMAL LOW (ref 6.5–8.1)

## 2022-01-23 LAB — CBC WITH DIFFERENTIAL/PLATELET
Abs Immature Granulocytes: 0.15 10*3/uL — ABNORMAL HIGH (ref 0.00–0.07)
Basophils Absolute: 0 10*3/uL (ref 0.0–0.1)
Basophils Relative: 0 %
Eosinophils Absolute: 0.2 10*3/uL (ref 0.0–0.5)
Eosinophils Relative: 1 %
HCT: 37.4 % — ABNORMAL LOW (ref 39.0–52.0)
Hemoglobin: 11.2 g/dL — ABNORMAL LOW (ref 13.0–17.0)
Immature Granulocytes: 1 %
Lymphocytes Relative: 10 %
Lymphs Abs: 1.6 10*3/uL (ref 0.7–4.0)
MCH: 28.7 pg (ref 26.0–34.0)
MCHC: 29.9 g/dL — ABNORMAL LOW (ref 30.0–36.0)
MCV: 95.9 fL (ref 80.0–100.0)
Monocytes Absolute: 1.3 10*3/uL — ABNORMAL HIGH (ref 0.1–1.0)
Monocytes Relative: 8 %
Neutro Abs: 12.8 10*3/uL — ABNORMAL HIGH (ref 1.7–7.7)
Neutrophils Relative %: 80 %
Platelets: 322 10*3/uL (ref 150–400)
RBC: 3.9 MIL/uL — ABNORMAL LOW (ref 4.22–5.81)
RDW: 15.6 % — ABNORMAL HIGH (ref 11.5–15.5)
WBC: 16.1 10*3/uL — ABNORMAL HIGH (ref 4.0–10.5)
nRBC: 0 % (ref 0.0–0.2)

## 2022-01-23 NOTE — Progress Notes (Signed)
I attest to student documentation.  Ammie Ferrier, MSN-RN, [place]

## 2022-01-23 NOTE — Progress Notes (Signed)
Subjective: Pain improved.  Eating solid diet with no n/v  Objective: Vital signs in last 24 hours: Temp:  [97.9 F (36.6 C)-99.6 F (37.6 C)] 97.9 F (36.6 C) (11/04 0823) Pulse Rate:  [61-65] 64 (11/04 0823) Resp:  [16-20] 16 (11/04 0823) BP: (99-103)/(57-61) 102/59 (11/04 0823) SpO2:  [98 %-100 %] 98 % (11/04 0823) Last BM Date : 01/20/22  Intake/Output from previous day: 11/03 0701 - 11/04 0700 In: 1772.4 [P.O.:1536; I.V.:5; IV Piggyback:221.4] Out: 2240 [Urine:2200; Drains:40] Intake/Output this shift: No intake/output data recorded.  PE: Abd: soft, tender as expected, perc chole drain in place, bilious effluent   Lab Results:  Recent Labs    01/22/22 0528 01/23/22 0705  WBC 13.0* 16.1*  HGB 11.3* 11.2*  HCT 36.9* 37.4*  PLT 340 322    BMET Recent Labs    01/21/22 0723 01/22/22 0528  NA 142 138  K 4.6 3.9  CL 106 103  CO2 31 29  GLUCOSE 99 119*  BUN 8 9  CREATININE 0.78 0.81  CALCIUM 8.8* 8.0*    PT/INR Recent Labs    01/20/22 2127  LABPROT 16.3*  INR 1.3*    CMP     Component Value Date/Time   NA 138 01/22/2022 0528   NA 143 03/10/2017 0911   K 3.9 01/22/2022 0528   K 4.7 03/10/2017 0911   CL 103 01/22/2022 0528   CO2 29 01/22/2022 0528   CO2 30 (H) 03/10/2017 0911   GLUCOSE 119 (H) 01/22/2022 0528   GLUCOSE 90 03/10/2017 0911   BUN 9 01/22/2022 0528   BUN 10.7 03/10/2017 0911   CREATININE 0.81 01/22/2022 0528   CREATININE 0.68 12/28/2021 1219   CREATININE 0.8 03/10/2017 0911   CALCIUM 8.0 (L) 01/22/2022 0528   CALCIUM 9.4 03/10/2017 0911   PROT 5.8 (L) 01/22/2022 0528   PROT 7.6 03/10/2017 0911   ALBUMIN 2.3 (L) 01/22/2022 0528   ALBUMIN 4.1 03/10/2017 0911   AST 33 01/22/2022 0528   AST 21 12/28/2021 1219   AST 34 03/10/2017 0911   ALT 44 01/22/2022 0528   ALT 17 12/28/2021 1219   ALT 45 03/10/2017 0911   ALKPHOS 106 01/22/2022 0528   ALKPHOS 76 03/10/2017 0911   BILITOT 1.5 (H) 01/22/2022 0528   BILITOT 1.3  (H) 12/28/2021 1219   BILITOT 1.41 (H) 03/10/2017 0911   GFRNONAA >60 01/22/2022 0528   GFRNONAA >60 12/28/2021 1219   GFRAA >60 12/24/2019 1042   Lipase     Component Value Date/Time   LIPASE 73 (H) 01/20/2022 1035       Studies/Results: IR Perc Cholecystostomy  Result Date: 01/21/2022 INDICATION: 76 year old with history of acute calculus cholecystitis, poor surgical candidate. EXAM: Ultrasound and fluoroscopic guided percutaneous cholecystostomy tube placement MEDICATIONS: The patient was receiving intravenous antibiotics as an inpatient.; The antibiotics were administered within an appropriate time frame prior to the initiation of the procedure. ANESTHESIA/SEDATION: Moderate (conscious) sedation was employed during this procedure. A total of Versed 2 mg and Fentanyl 100 mcg was administered intravenously. Moderate Sedation Time: 10 minutes. The patient's level of consciousness and vital signs were monitored continuously by radiology nursing throughout the procedure under my direct supervision. FLUOROSCOPY TIME:  Four mGy COMPLICATIONS: None immediate. PROCEDURE: Informed written consent was obtained from the patient after a thorough discussion of the procedural risks, benefits and alternatives. All questions were addressed. Maximal Sterile Barrier Technique was utilized including caps, mask, sterile gowns, sterile gloves, sterile drape, hand  hygiene and skin antiseptic. A timeout was performed prior to the initiation of the procedure. The patient was placed supine on the angiographic table. The patient's right upper quadrant was then prepped and draped in normal sterile fashion with maximum sterile barrier. Ultrasound demonstrates a distended gallbladder with multiple echogenic gallstones. Subdermal Local anesthesia was provided at the planned skin entry site. Under ultrasound guidance, deeper local anesthetic was provided through intercostal muscles and along the liver capsule. Ultrasound was  used to puncture the gallbladder using a 18 gauge trocar needle via a subcostal transhepatic approach with visualization of the lung treated to the gallbladder. There was efflux of thick, purulent fluid when the stylet was removed. A 0.035 inch exchange wire was placed in the tract was dilated. A 10.2 French multipurpose drainage catheter was advanced into the gallbladder lumen. The drain was then secured in place using a 0-silk suture and a Stayfix device. Approximately 40 of purulent fluid was aspirated and a sample sent culture. A sterile dressing was applied. The tube was placed to bulb suction. The patient tolerated procedure well without evidence of immediate complication was transferred back to the floor in stable condition. IMPRESSION: Successful placement of 10.2 French percutaneous, subcostal transhepatic cholecystostomy tube. PLAN: If the patient is deemed a poor surgical candidate indefinitely, he would likely benefit from percutaneous choledochoscopic (Spyglass) gallstone retrieval and hopefully eventual tube removal. IR will arrange for 8 week follow-up cholecystostomy tube check and exchange and will discuss further plan with the patient at that time. Ruthann Cancer, MD [facility] Electronically Signed   By: Ruthann Cancer M.D.   On: 01/21/2022 09:24    Anti-infectives: Anti-infectives (From admission, onward)    Start     Dose/Rate Route Frequency Ordered Stop   01/22/22 2200  piperacillin-tazobactam (ZOSYN) IVPB 3.375 g        3.375 g 12.5 mL/hr over 240 Minutes Intravenous Every 8 hours 01/22/22 1654     01/20/22 2000  piperacillin-tazobactam (ZOSYN) IVPB 3.375 g        3.375 g 12.5 mL/hr over 240 Minutes Intravenous Every 8 hours 01/20/22 1356 01/22/22 1912   01/20/22 1300  piperacillin-tazobactam (ZOSYN) IVPB 3.375 g        3.375 g 100 mL/hr over 30 Minutes Intravenous  Once 01/20/22 1254 01/20/22 1406         Assessment/Plan Gangrenous, perforated cholecystitis S/p IR perc chole drain 11/2 -cxs - prelim with E coli -cont abx therapy, WBC 13K -cont regular diet   -LFTs overall improving, TB stable around 1.5 -follow up being arranged in our office in 6 weeks -will need IR follow up for drain clinic as well  FEN - regular diet VTE - may resume DOAC when ok with IR ID - zosyn  I reviewed Consultant IR notes, hospitalist notes, last 24 h vitals and pain scores, last 48 h intake and output, last 24 h labs and trends, and last 24 h imaging results.   LOS: 3 days    Rosario Adie , [facility] 01/23/2022, 8:36 AM Please see Amion for pager number during day hours 7:00am-4:30pm or 7:00am -11:30am on weekends

## 2022-01-23 NOTE — Progress Notes (Signed)
PROGRESS NOTE  Joe [name]  DOB: 1945-12-19  PCP: Cassandria Anger, MD YSA:[id number]  DOA: 01/20/2022  LOS: 3 days  Hospital Day: 4  Brief narrative: Joe [name] is a 76 y.o. male with PMH significant for HTN, paroxysmal A-fib, CLL, COPD on 2 L oxygen nasal cannula, hypothyroidism, depression hold alone 11/1, patient presented to ED with complaint of right upper quadrant pain with chills and decreased appetite for 2 weeks.   In the ED, patient was hemodynamically stable  Work-up showed mildly elevated transaminases right upper quadrant ultrasound suggested acute cholecystitis with gallstone.  Started on IV antibiotics. Admitted to [name] and IR consulted.   11/2, underwent percutaneous drainage by IR.   Subjective: Patient was seen and examined this morning.  Pleasant elderly Caucasian male.  Lying down in bed.  Not in distress.  Complains of cough worsening for the last 1 to 2 days while in the hospital.  Chronically on oxygen by nasal Chart reviewed In the last 24 hours, afebrile, heart rate in 60s, blood pressure in low 100s, on 2 L oxygen nasal cannula this morning Last set of labs from this morning with WBC count up to 16.1, hemoglobin 11.2, albumin low at 2.3, renal function stable  Assessment and plan: Gangrenous, perforated cholecystitis with cholelithiasis s/p IR percutaneous cholecystomy drain placement Intra-Op culture grew E. Coli Currently on IV Zosyn WBC count up to 16.1 today.  No fever.   Continue to monitor. Improving LFTs, bilirubin. Drain education by IR Surgery and IR to schedule outpatient follow-up. Recent Labs  Lab 01/20/22 1035 01/20/22 1218 01/21/22 0511 01/22/22 0528 01/23/22 0705  WBC 10.9*  --  11.1* 13.0* 16.1*  LATICACIDVEN 0.8 0.8  --   --   --    Leukocytosis WBC count elevated to 16.1 today.  Complains of worsening cough for last 24 to 48 hours. Obtain chest x-ray.  Essential hypertension Blood pressure  stable on amlodipine  GERD PPI  CLL, stable  Hypothyroidism On levothyroxine 112 mcg.  Paroxysmal a flutter Currently on sinus rhythm.  Eliquis resumed  OSA on CPAP    Goals of care   Code Status: Full Code    Mobility: Encourage ambulation  Skin assessment: None    Nutritional status:  Body mass index is 24.83 kg/m.  Nutrition Problem: Severe Malnutrition Etiology: acute illness Signs/Symptoms: moderate fat depletion, moderate muscle depletion, energy intake < or equal to 50% for > or equal to 5 days     Diet:  Diet Order             Diet regular Room service appropriate? Yes; Fluid consistency: Thin  Diet effective now                   DVT prophylaxis:  SCDs Start: 01/20/22 1357 apixaban (ELIQUIS) tablet 5 mg   Antimicrobials: IV Zosyn Fluid: None Consultants: General surgery, IR Family Communication: None at bedside  Status is: Inpatient  Continue in-hospital care because: Obtain chest x-ray.  Repeat labs tomorrow Level of care: Med-Surg   Dispo: The patient is from: Home              Anticipated d/c is to: Pending clinical course              Patient currently is not medically stable to d/c.   Difficult to place patient No     Infusions:   piperacillin-tazobactam (ZOSYN)  IV 3.375 g (01/23/22 1357)    Scheduled Meds:  amLODipine  5 mg Oral Daily   apixaban  5 mg Oral BID   levothyroxine  112 mcg Oral Daily   sodium chloride flush  5 mL Intracatheter Q8H    PRN meds: albuterol, atenolol, diazepam, morphine injection, ondansetron **OR** ondansetron (ZOFRAN) IV, mouth rinse, polyvinyl alcohol   Antimicrobials: Anti-infectives (From admission, onward)    Start     Dose/Rate Route Frequency Ordered Stop   01/22/22 2200  piperacillin-tazobactam (ZOSYN) IVPB 3.375 g        3.375 g 12.5 mL/hr over 240 Minutes Intravenous Every 8 hours 01/22/22 1654     01/20/22 2000  piperacillin-tazobactam (ZOSYN) IVPB 3.375 g        3.375  g 12.5 mL/hr over 240 Minutes Intravenous Every 8 hours 01/20/22 1356 01/23/22 0958   01/20/22 1300  piperacillin-tazobactam (ZOSYN) IVPB 3.375 g        3.375 g 100 mL/hr over 30 Minutes Intravenous  Once 01/20/22 1254 01/20/22 1406       Objective: Vitals:   01/23/22 0823 01/23/22 1237  BP: (!) 102/59 104/63  Pulse: 64 62  Resp: 16 20  Temp: 97.9 F (36.6 C) 98.3 F (36.8 C)  SpO2: 98% 100%    Intake/Output Summary (Last 24 hours) at 01/23/2022 1416 Last data filed at 01/23/2022 1109 Gross per 24 hour  Intake 1117.33 ml  Output 2730 ml  Net -1612.67 ml   Filed Weights   01/20/22 2009  Weight: 80.7 kg   Weight change:  Body mass index is 24.83 kg/m.   Physical Exam: General exam: Pleasant, elderly Caucasian male.  Abdomen sore around the catheter site Skin: No rashes, lesions or ulcers. HEENT: Atraumatic, normocephalic, no obvious bleeding Lungs: Diminished air entry in both bases, no crackles or wheezing CVS: Regular rate and rhythm, no murmur GI/Abd soft, mild tenderness around catheter site, bowel sound present CNS: Alert, awake, oriented x3 Psychiatry: Mood appropriate Extremities: No pedal edema, no calf tenderness  Data Review: I have personally reviewed the laboratory data and studies available.  F/u labs ordered Unresulted Labs (From admission, onward)     Start     Ordered   01/24/22 0500  CBC with Differential/Platelet  Tomorrow morning,   R        01/23/22 1415   01/24/22 7711  Basic metabolic panel  Tomorrow morning,   R        01/23/22 1415            Signed, Terrilee Croak, MD [facility] 01/23/2022

## 2022-01-23 NOTE — Progress Notes (Addendum)
[facility] Progress Note     Subjective: CC-  Pt. States pain has improved and is not on any current medication for pain. He is able to ambulate himself with a cane and has been on 2 walks yesterday, 11/3, on the hospital floor. Pt. is able to eat a regular diet with no nausea or vomiting. He is able to pass flatus from his bottom, urinate with no issues, and has not has a bowel movement since Wednesday but states he has colace at home that he takes for constipation. Pt. Denies any nausea, vomiting, fever, or chills.   Objective: Vital signs in last 24 hours: Temp:  [97.9 F (36.6 C)-99.6 F (37.6 C)] 97.9 F (36.6 C) (11/04 0823) Pulse Rate:  [61-65] 64 (11/04 0823) Resp:  [16-20] 16 (11/04 0823) BP: (99-103)/(57-61) 102/59 (11/04 0823) SpO2:  [98 %-100 %] 98 % (11/04 0823) Last BM Date : 01/20/22  Intake/Output from previous day: 11/03 0701 - 11/04 0700 In: 1772.4 [P.O.:1536; I.V.:5; IV Piggyback:221.4] Out: 2240 [Urine:2200; Drains:40] Intake/Output this shift: No intake/output data recorded.  PE: Gen:  Alert, NAD, pleasant Abd: Soft and tenderness to palpation around RUQ around drain, perc chole drain in place with minimal drainage  Lab Results:  Recent Labs    01/22/22 0528 01/23/22 0705  WBC 13.0* 16.1*  HGB 11.3* 11.2*  HCT 36.9* 37.4*  PLT 340 322   BMET Recent Labs    01/22/22 0528 01/23/22 0705  NA 138 135  K 3.9 3.6  CL 103 99  CO2 29 31  GLUCOSE 119* 116*  BUN 9 8  CREATININE 0.81 0.72  CALCIUM 8.0* 8.2*   PT/INR Recent Labs    01/20/22 2127  LABPROT 16.3*  INR 1.3*   CMP     Component Value Date/Time   NA 135 01/23/2022 0705   NA 143 03/10/2017 0911   K 3.6 01/23/2022 0705   K 4.7 03/10/2017 0911   CL 99 01/23/2022 0705   CO2 31 01/23/2022 0705   CO2 30 (H) 03/10/2017 0911   GLUCOSE 116 (H) 01/23/2022 0705   GLUCOSE 90 03/10/2017 0911   BUN 8 01/23/2022 0705   BUN 10.7 03/10/2017 0911   CREATININE 0.72 01/23/2022  0705   CREATININE 0.68 12/28/2021 1219   CREATININE 0.8 03/10/2017 0911   CALCIUM 8.2 (L) 01/23/2022 0705   CALCIUM 9.4 03/10/2017 0911   PROT 5.9 (L) 01/23/2022 0705   PROT 7.6 03/10/2017 0911   ALBUMIN 2.3 (L) 01/23/2022 0705   ALBUMIN 4.1 03/10/2017 0911   AST 27 01/23/2022 0705   AST 21 12/28/2021 1219   AST 34 03/10/2017 0911   ALT 38 01/23/2022 0705   ALT 17 12/28/2021 1219   ALT 45 03/10/2017 0911   ALKPHOS 90 01/23/2022 0705   ALKPHOS 76 03/10/2017 0911   BILITOT 1.2 01/23/2022 0705   BILITOT 1.3 (H) 12/28/2021 1219   BILITOT 1.41 (H) 03/10/2017 0911   GFRNONAA >60 01/23/2022 0705   GFRNONAA >60 12/28/2021 1219   GFRAA >60 12/24/2019 1042   Lipase     Component Value Date/Time   LIPASE 73 (H) 01/20/2022 1035       Studies/Results: No results found.  Anti-infectives: Anti-infectives (From admission, onward)    Start     Dose/Rate Route Frequency Ordered Stop   01/22/22 2200  piperacillin-tazobactam (ZOSYN) IVPB 3.375 g        3.375 g 12.5 mL/hr over 240 Minutes Intravenous Every 8 hours 01/22/22 1654  01/20/22 2000  piperacillin-tazobactam (ZOSYN) IVPB 3.375 g        3.375 g 12.5 mL/hr over 240 Minutes Intravenous Every 8 hours 01/20/22 1356 01/22/22 1912   01/20/22 1300  piperacillin-tazobactam (ZOSYN) IVPB 3.375 g        3.375 g 100 mL/hr over 30 Minutes Intravenous  Once 01/20/22 1254 01/20/22 1406        Assessment/Plan  Gangrenous, perforated cholecystitis  - S/p IR perc chole drain 11/2 -cxs - prelim with E coli -cont abx therapy, WBC 16K -cont regular diet   -LFTs overall improving, TB stable around 1.5   ID - zosyn FEN - regular diet  VTE - may resume DOAC when ok with IR   Plan: follow up being arranged in our office in 6 weeks -will need IR follow up for drain clinic as well  I reviewed last 24 h vitals and pain scores, last 48 h intake and output, last 24 h labs and trends, and last 24 h imaging results.    LOS: 3 days     Randel Books, [facility]

## 2022-01-23 NOTE — Progress Notes (Signed)
Mobility Specialist - Progress Note   01/23/22 0932  Mobility  Activity Ambulated with assistance in hallway  Level of Assistance Modified independent, requires aide device or extra time  Assistive Device Cane  Distance Ambulated (ft) 500 ft  Activity Response Tolerated well  Mobility Referral Yes  $Mobility charge 1 Mobility   Pt received in bed and agreed to mobility, felt somewhat wozzy upon standing, settled shortly after. Pt had no c/o pain nor discomfort during session, pt returned to bed with all needs met.   Roderick Pee Mobility Specialist

## 2022-01-24 DIAGNOSIS — K810 Acute cholecystitis: Secondary | ICD-10-CM | POA: Diagnosis not present

## 2022-01-24 LAB — CBC WITH DIFFERENTIAL/PLATELET
Abs Immature Granulocytes: 0.06 10*3/uL (ref 0.00–0.07)
Basophils Absolute: 0 10*3/uL (ref 0.0–0.1)
Basophils Relative: 0 %
Eosinophils Absolute: 0.2 10*3/uL (ref 0.0–0.5)
Eosinophils Relative: 2 %
HCT: 38.3 % — ABNORMAL LOW (ref 39.0–52.0)
Hemoglobin: 11.7 g/dL — ABNORMAL LOW (ref 13.0–17.0)
Immature Granulocytes: 1 %
Lymphocytes Relative: 12 %
Lymphs Abs: 1.4 10*3/uL (ref 0.7–4.0)
MCH: 28.7 pg (ref 26.0–34.0)
MCHC: 30.5 g/dL (ref 30.0–36.0)
MCV: 94.1 fL (ref 80.0–100.0)
Monocytes Absolute: 0.9 10*3/uL (ref 0.1–1.0)
Monocytes Relative: 8 %
Neutro Abs: 9.1 10*3/uL — ABNORMAL HIGH (ref 1.7–7.7)
Neutrophils Relative %: 77 %
Platelets: 225 10*3/uL (ref 150–400)
RBC: 4.07 MIL/uL — ABNORMAL LOW (ref 4.22–5.81)
RDW: 15.6 % — ABNORMAL HIGH (ref 11.5–15.5)
WBC: 11.7 10*3/uL — ABNORMAL HIGH (ref 4.0–10.5)
nRBC: 0 % (ref 0.0–0.2)

## 2022-01-24 LAB — BASIC METABOLIC PANEL
Anion gap: 6 (ref 5–15)
BUN: 8 mg/dL (ref 8–23)
CO2: 31 mmol/L (ref 22–32)
Calcium: 8.3 mg/dL — ABNORMAL LOW (ref 8.9–10.3)
Chloride: 99 mmol/L (ref 98–111)
Creatinine, Ser: 0.43 mg/dL — ABNORMAL LOW (ref 0.61–1.24)
GFR, Estimated: >60 mL/min (ref >60)
Glucose, Bld: 102 mg/dL — ABNORMAL HIGH (ref 70–99)
Potassium: 3.7 mmol/L (ref 3.5–5.1)
Sodium: 136 mmol/L (ref 135–145)

## 2022-01-24 MED ORDER — SACCHAROMYCES BOULARDII 250 MG PO CAPS: 250.0000 mg | ORAL_CAPSULE | Freq: Two times a day (BID) | ORAL | 0 refills | Status: AC

## 2022-01-24 MED ORDER — AMOXICILLIN-POT CLAVULANATE 875-125 MG PO TABS: 1.0000 | ORAL_TABLET | Freq: Two times a day (BID) | ORAL | 0 refills | Status: AC

## 2022-01-24 NOTE — Discharge Summary (Signed)
Physician Discharge Summary  Joe [name] JKK:[id number] DOB: 08/25/1945 DOA: 01/20/2022  PCP: Cassandria Anger, MD  Admit date: 01/20/2022 Discharge date: 01/24/2022  Admitted From: Home Discharge disposition: Home  Recommendations at discharge:  --5 more days of oral antibiotics  --Drain care per IR: Percutaneous cholecystostomy drain to remain in place at least 6 weeks. Recommend fluoroscopy with injection of the drain in IR to evaluate for patency of the cystic duct. If the duct is patent and general surgery feels patient is stable for cholecystectomy, the drain would be removed at time of surgery. If the duct is patent and general surgery feels patient is NEVER a candidate for cholecystectomy, drain can be capped for a trial. If symptoms recur, then place to gravity bag again. If trial is successful, discuss possible removal of the drain. Patient will follow up with Dr. Serafina Royals in approximately 8 weeks. A scheduler from our office will call the patient with a date/time of his appointment.  Flush the drain once daily with 5 ml NS, empty the bulb regularly and document the output, to change the dressing daily or as needed and to keep the site clean and dry.  Patient has the number to the IR APP office at [facility].  Patient can calls our office with any questions/concerns.    Brief narrative: Joe [name] is a 76 y.o. male with PMH significant for HTN, paroxysmal A-fib, CLL, COPD on 2 L oxygen nasal cannula, hypothyroidism, depression hold alone 11/1, patient presented to ED with complaint of right upper quadrant pain with chills and decreased appetite for 2 weeks.   In the ED, patient was hemodynamically stable  Work-up showed mildly elevated transaminases right upper quadrant ultrasound suggested acute cholecystitis with gallstone.  Started on IV antibiotics. Admitted to [place] [facility] and IR consulted.   11/2, underwent percutaneous drainage by IR.    Subjective: Patient was seen and examined this morning.  Pleasant elderly Caucasian male.  Lying down in bed.  Not in distress.   Patient was walking earlier on the hallway with morbidly specialist.  On 2 L oxygen nasal cannula. Cough improving. WC count improving.  Hospital course: Gangrenous, perforated cholecystitis with cholelithiasis s/p IR percutaneous cholecystomy drain placement Intra-Op culture grew E. Coli Currently on IV Zosyn.   WBC count up to 16.1 today.  No fever.   Discharge on 5 more days of oral Augmentin with probiotics. Improved LFTs and bilirubin. Leukocytosis improving as well. Drain education by IR Surgery and IR to schedule outpatient follow-up. Recent Labs  Lab 01/20/22 1035 01/20/22 1218 01/21/22 0511 01/22/22 0528 01/23/22 0705 01/24/22 0542  WBC 10.9*  --  11.1* 13.0* 16.1* 11.7*  LATICACIDVEN 0.8 0.8  --   --   --   --    Recent Labs  Lab 01/20/22 1035 01/21/22 0723 01/22/22 0528 01/23/22 0705  AST 43* 41 33 27  ALT 64* 54* 44 38  ALKPHOS 159* 130* 106 90  BILITOT 2.0* 1.5* 1.5* 1.2  PROT 6.9 6.1* 5.8* 5.9*  ALBUMIN 2.7* 2.4* 2.3* 2.3*    Essential hypertension Blood pressure stable on amlodipine and atenolol as needed.  GERD PPI  CLL, stable  Hypothyroidism On levothyroxine 112 mcg.  Paroxysmal a flutter Currently on sinus rhythm.  Eliquis resumed  OSA on CPAP  Wounds:  -    Discharge Exam:   Vitals:   01/23/22 1237 01/23/22 2028 01/24/22 0514 01/24/22 0900  BP: 104/63 (!) 95/59 104/64 (!) 96/54  Pulse: 62  62 (!) 59 60  Resp: 20 19 18    Temp: 98.3 F (36.8 C) 98.3 F (36.8 C) 97.8 F (36.6 C)   TempSrc: Oral Oral Oral   SpO2: 100% 99% 98%   Weight:      Height:        Body mass index is 24.83 kg/m.  General exam: Pleasant, elderly Caucasian male.  Not in pain or distress. Skin: No rashes, lesions or ulcers. HEENT: Atraumatic, normocephalic, no obvious bleeding Lungs: Diminished air entry in both  bases, no crackles or wheezing CVS: Regular rate and rhythm, no murmur GI/Abd soft, abdominal tenderness improving.  bowel sound present CNS: Alert, awake, oriented x3 Psychiatry: Mood appropriate Extremities: No pedal edema, no calf tenderness  Follow ups:    Follow-up Information     Stechschulte, Nickola Major, MD Follow up on 03/04/2022.   Specialty: Surgery Why: 10:50 am, Arrive 30 minutes prior to your appointment time, Please bring your insurance card and photo ID Contact information: [address] [place] [address] [phone]         Suzette Battiest, MD Follow up.   Specialties: Interventional Radiology, Diagnostic Radiology, Radiology Why: they will call you with appointment date and time Contact information: [address] [place] [address] [phone]         [name], [name], MD Follow up.   Specialty: Internal Medicine Contact information: [place] [address] [phone]                 Discharge Instructions:   Discharge Instructions     Call MD for:  difficulty breathing, headache or visual disturbances   Complete by: As directed    Call MD for:  extreme fatigue   Complete by: As directed    Call MD for:  hives   Complete by: As directed    Call MD for:  persistant dizziness or light-headedness   Complete by: As directed    Call MD for:  persistant nausea and vomiting   Complete by: As directed    Call MD for:  severe uncontrolled pain   Complete by: As directed    Call MD for:  temperature >100.4   Complete by: As directed    Diet - low sodium heart healthy   Complete by: As directed    Discharge instructions   Complete by: As directed    Recommendations at discharge:  --5 more days of oral antibiotics  --Drain care per IR:  Percutaneous cholecystostomy drain to remain in place at least 6 weeks. Recommend fluoroscopy with injection of the drain in IR to evaluate for  patency of the cystic duct.  If the duct is patent and general surgery feels patient is stable for cholecystectomy, the drain would be removed at time of surgery.  If the duct is patent and general surgery feels patient is NEVER a candidate for cholecystectomy, drain can be capped for a trial. If symptoms recur, then place to gravity bag again. If trial is successful, discuss possible removal of the drain.  Patient will follow up with Dr. Serafina Royals in approximately 8 weeks. A scheduler from our office will call the patient with a date/time of his appointment.   Flush the drain once daily with 5 ml NS, empty the bulb regularly and document the output, to change the dressing daily or as needed and to keep the site clean and dry.  Patient has the number to the  IR APP office at [facility].  Patient can calls our office with any questions/concerns.    General discharge instructions: Follow with Primary MD [name], [name], MD in 7 days  Please request your PCP  to go over your hospital tests, procedures, radiology results at the follow up. Please get your medicines reviewed and adjusted.  Your PCP may decide to repeat certain labs or tests as needed. Do not drive, operate heavy machinery, perform activities at heights, swimming or participation in water activities or provide baby sitting services if your were admitted for syncope or siezures until you have seen by Primary MD or a Neurologist and advised to do so again. [place] Controlled Substance Reporting System database was reviewed. Do not drive, operate heavy machinery, perform activities at heights, swim, participate in water activities or provide baby-sitting services while on medications for pain, sleep and mood until your outpatient physician has reevaluated you and advised to do so again.  You are strongly recommended to comply with the dose, frequency and duration of prescribed medications. Activity: As tolerated with Full fall precautions use  walker/cane & assistance as needed Avoid using any recreational substances like cigarette, tobacco, alcohol, or non-prescribed drug. If you experience worsening of your admission symptoms, develop shortness of breath, life threatening emergency, suicidal or homicidal thoughts you must seek medical attention immediately by calling 911 or calling your MD immediately  if symptoms less severe. You must read complete instructions/literature along with all the possible adverse reactions/side effects for all the medicines you take and that have been prescribed to you. Take any new medicine only after you have completely understood and accepted all the possible adverse reactions/side effects.  Wear Seat belts while driving. You were cared for by a hospitalist during your hospital stay. If you have any questions about your discharge medications or the care you received while you were in the hospital after you are discharged, you can call the unit and ask to speak with the hospitalist or the covering physician. Once you are discharged, your primary care physician will handle any further medical issues. Please note that NO REFILLS for any discharge medications will be authorized once you are discharged, as it is imperative that you return to your primary care physician (or establish a relationship with a primary care physician if you do not have one).   Discharge wound care:   Complete by: As directed    Increase activity slowly   Complete by: As directed        Discharge Medications:   Allergies as of 01/24/2022   No Known Allergies      Medication List     TAKE these medications    albuterol (2.5 MG/3ML) 0.083% nebulizer solution Commonly known as: PROVENTIL Take 3 mLs (2.5 mg total) by nebulization every 6 (six) hours as needed for wheezing or shortness of breath.   amLODipine 5 MG tablet Commonly known as: NORVASC TAKE 1 TABLET BY MOUTH EVERY DAY   amoxicillin-clavulanate 875-125 MG  tablet Commonly known as: AUGMENTIN Take 1 tablet by mouth 2 (two) times daily for 5 days.   atenolol 25 MG tablet Commonly known as: TENORMIN Take 1 tablet (25 mg total) by mouth daily as needed (palpitations). What changed: reasons to take this   Cholecalciferol 25 MCG (1000 UT) tablet Take 1,000 Units by mouth daily.   clotrimazole-betamethasone cream Commonly known as: Lotrisone Apply topically 2 (two) times daily. What changed:  how much to take when to take this reasons  to take this   diazepam 5 MG tablet Commonly known as: VALIUM Take 1 tablet (5 mg total) by mouth every 12 (twelve) hours as needed for anxiety. What changed: reasons to take this   Eliquis 5 MG Tabs tablet Generic drug: apixaban TAKE 1 TABLET(5 MG) BY MOUTH TWICE DAILY What changed: See the new instructions.   levothyroxine 112 MCG tablet Commonly known as: SYNTHROID TAKE 1 TABLET(112 MCG) BY MOUTH DAILY What changed: See the new instructions.   multivitamin tablet Take 1 tablet by mouth daily. Centrum Silver.   Normal Saline Flush 0.9 % Soln Flush gallbladder drain with 5-10 ml once daily   saccharomyces boulardii 250 MG capsule Commonly known as: FLORASTOR Take 1 capsule (250 mg total) by mouth 2 (two) times daily for 5 days.   SYSTANE ULTRA OP Place 1 drop into both eyes 2 (two) times daily as needed (dry eyes).               Discharge Care Instructions  (From admission, onward)           Start     Ordered   01/24/22 0000  Discharge wound care:        01/24/22 1107             The results of significant diagnostics from this hospitalization (including imaging, microbiology, ancillary and laboratory) are listed below for reference.    Procedures and Diagnostic Studies:   IR Perc Cholecystostomy  Result Date: 01/21/2022 INDICATION: 76 year old with history of acute calculus cholecystitis, poor surgical candidate. EXAM: Ultrasound and fluoroscopic guided  percutaneous cholecystostomy tube placement MEDICATIONS: The patient was receiving intravenous antibiotics as an inpatient.; The antibiotics were administered within an appropriate time frame prior to the initiation of the procedure. ANESTHESIA/SEDATION: Moderate (conscious) sedation was employed during this procedure. A total of Versed 2 mg and Fentanyl 100 mcg was administered intravenously. Moderate Sedation Time: 10 minutes. The patient's level of consciousness and vital signs were monitored continuously by radiology nursing throughout the procedure under my direct supervision. FLUOROSCOPY TIME:  Four mGy COMPLICATIONS: None immediate. PROCEDURE: Informed written consent was obtained from the patient after a thorough discussion of the procedural risks, benefits and alternatives. All questions were addressed. Maximal Sterile Barrier Technique was utilized including caps, mask, sterile gowns, sterile gloves, sterile drape, hand hygiene and skin antiseptic. A timeout was performed prior to the initiation of the procedure. The patient was placed supine on the angiographic table. The patient's right upper quadrant was then prepped and draped in normal sterile fashion with maximum sterile barrier. Ultrasound demonstrates a distended gallbladder with multiple echogenic gallstones. Subdermal Local anesthesia was provided at the planned skin entry site. Under ultrasound guidance, deeper local anesthetic was provided through intercostal muscles and along the liver capsule. Ultrasound was used to puncture the gallbladder using a 18 gauge trocar needle via a subcostal transhepatic approach with visualization of the lung treated to the gallbladder. There was efflux of thick, purulent fluid when the stylet was removed. A 0.035 inch exchange wire was placed in the tract was dilated. A 10.2 French multipurpose drainage catheter was advanced into the gallbladder lumen. The drain was then secured in place using a 0-silk suture  and a Stayfix device. Approximately 40 of purulent fluid was aspirated and a sample sent culture. A sterile dressing was applied. The tube was placed to bulb suction. The patient tolerated procedure well without evidence of immediate complication was transferred back to the floor in stable condition. IMPRESSION:  Successful placement of 10.2 French percutaneous, subcostal transhepatic cholecystostomy tube. PLAN: If the patient is deemed a poor surgical candidate indefinitely, he would likely benefit from percutaneous choledochoscopic (Spyglass) gallstone retrieval and hopefully eventual tube removal. IR will arrange for 8 week follow-up cholecystostomy tube check and exchange and will discuss further plan with the patient at that time. Ruthann Cancer, MD [facility] Radiology Electronically Signed   By: Ruthann Cancer M.D.   On: 01/21/2022 09:24   MR ABDOMEN MRCP W WO CONTAST  Addendum Date: 01/21/2022   ADDENDUM REPORT: 01/21/2022 01:14 ADDENDUM: In regards to the splenic lesion, while this may reflect the sequela of remote trauma or inflammation or a nonaggressive lesion such as a littoral cell angioma, low-grade lymphoma should also be considered in the differential despite its lack of hypermetabolism on PET CT examination of 01/15/2022 and lack of significant restricted diffusion. In absence of prior contrast enhanced examinations, a follow-up examination in 6-12 months may be helpful to ensure continued stability. Electronically Signed   By: Fidela Salisbury M.D.   On: 01/21/2022 01:14   Result Date: 01/21/2022 CLINICAL DATA:  Cholelithiasis, choledocholithiasis EXAM: MRI ABDOMEN WITHOUT AND WITH CONTRAST (INCLUDING MRCP) TECHNIQUE: Multiplanar multisequence MR imaging of the abdomen was performed both before and after the administration of intravenous contrast. Heavily T2-weighted images of the biliary and pancreatic ducts were obtained, and three-dimensional  MRCP images were rendered by post processing. CONTRAST:  77mL GADAVIST GADOBUTROL 1 MMOL/ML IV SOLN COMPARISON:  None Available. FINDINGS: Lower chest: No acute findings. Hepatobiliary: Normal hepatic parenchymal signal intensity. 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 No other intrahepatic lesions are identified. No intrahepatic biliary ductal dilation. The gallbladder contains multiple gallstones. The gallbladder is thick walled. There is hyperemia of the gallbladder wall with irregularity of the wall seen within the fundus suggesting areas of focal perforation, best appreciated on image # 18/5 and image # 47/19. There is extensive surrounding edema. Mild reactive hyperemia within the adjacent gallbladder fossa. Together, the findings are in keeping with acute perforated cholecystitis. No intra or extrahepatic biliary ductal dilation. No choledocholithiasis. Pancreas: No mass, inflammatory changes, or other parenchymal abnormality identified. Spleen: A 17 mm subcapsular lesion is seen within the lateral aspect of the spleen at axial image # 13/5 which is hypointense on T1 and T2 weighted imaging and demonstrates relative hypoenhancement and no restricted diffusion likely representing the sequela of remote trauma or inflammation or potentially a lesion such as a littoral cell angioma. An inflammatory granuloma of sarcoidosis is considered less likely given its solitary nature. Mild splenomegaly is present with the spleen measuring 14.2 cm in greatest dimension. Adrenals/Urinary Tract: Adrenal glands are unremarkable. The kidneys are normal in size and position. Multiple simple cortical and parapelvic cysts are seen bilaterally. No follow-up imaging is recommended for  these lesions. Mild bilateral nonspecific perinephric edema is present. No enhancing intrarenal masses. No hydronephrosis. Stomach/Bowel: Small duodenal diverticulum. The visualized large and small bowel are otherwise unremarkable. Vascular/Lymphatic: Enlarged periportal lymph node seen at axial image # 17/5 likely represents reactive adenopathy related to the inflammatory process within the gallbladder. No additional pathologic adenopathy within the abdomen. The abdominal vasculature is unremarkable. Other:  None Musculoskeletal: Remote appearing L1 and L5 compression deformities are seen. Nonenhancing  lesion within the L3 vertebral body may represent a small cyst or hemangioma. IMPRESSION: 1. MR findings compatible with acute perforated cholecystitis with multiple gallstones and focal perforation of the gallbladder fundus. 2. No evidence of choledocholithiasis. 3. 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 4. 17 mm subcapsular lesion within the lateral aspect of the spleen likely representing the sequela of remote trauma or inflammation or potentially a lesion such as a littoral cell angioma. An inflammatory granuloma of sarcoidosis is considered less likely given its solitary nature. 5. Mild splenomegaly. 6. Remote appearing L1 and L5 compression deformities. Electronically Signed: By: Fidela Salisbury M.D. On: 01/21/2022 00:37   MR 3D Recon At Scanner  Addendum Date: 01/21/2022   ADDENDUM REPORT: 01/21/2022 01:14 ADDENDUM: In regards to the splenic lesion, while this may reflect the sequela of remote trauma or inflammation or a nonaggressive lesion such as a littoral cell angioma, low-grade lymphoma should also be considered in the differential despite its lack of hypermetabolism on PET CT examination of 01/15/2022 and lack of significant restricted diffusion. In absence of prior contrast enhanced examinations, a follow-up examination in  6-12 months may be helpful to ensure continued stability. Electronically Signed   By: Fidela Salisbury M.D.   On: 01/21/2022 01:14   Result Date: 01/21/2022 CLINICAL DATA:  Cholelithiasis, choledocholithiasis EXAM: MRI ABDOMEN WITHOUT AND WITH CONTRAST (INCLUDING MRCP) TECHNIQUE: Multiplanar multisequence MR imaging of the abdomen was performed both before and after the administration of intravenous contrast. Heavily T2-weighted images of the biliary and pancreatic ducts were obtained, and three-dimensional MRCP images were rendered by post processing. CONTRAST:  29mL GADAVIST GADOBUTROL 1 MMOL/ML IV SOLN COMPARISON:  None Available. FINDINGS: Lower chest: No acute findings. Hepatobiliary: Normal hepatic parenchymal signal intensity. 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 No other intrahepatic lesions are identified. No intrahepatic biliary ductal dilation. The gallbladder contains multiple gallstones. The gallbladder is thick walled. There is hyperemia of the gallbladder wall with irregularity of the wall seen within the fundus suggesting areas of focal perforation, best appreciated on image # 18/5 and image # 47/19. There is extensive surrounding edema. Mild reactive hyperemia within the adjacent gallbladder fossa. Together, the findings are in keeping with acute perforated cholecystitis. No intra or extrahepatic biliary ductal dilation. No choledocholithiasis. Pancreas: No mass, inflammatory changes, or other parenchymal abnormality identified. Spleen: A 17 mm subcapsular lesion is seen within the lateral aspect of the spleen at axial image # 13/5 which is hypointense on T1 and T2 weighted imaging and demonstrates relative hypoenhancement and no restricted  diffusion likely representing the sequela of remote trauma or inflammation or potentially a lesion such as a littoral cell angioma. An inflammatory granuloma of sarcoidosis is considered less likely given its solitary nature. Mild splenomegaly is present with the spleen measuring 14.2 cm in greatest dimension. Adrenals/Urinary Tract: Adrenal glands are unremarkable. The kidneys are normal in size and position. Multiple simple cortical and parapelvic cysts are seen bilaterally. No follow-up imaging is recommended for these lesions. Mild bilateral nonspecific perinephric edema is present. No enhancing intrarenal masses. No hydronephrosis. Stomach/Bowel: Small duodenal diverticulum. The visualized large and small bowel are otherwise unremarkable. Vascular/Lymphatic: Enlarged periportal lymph node seen at axial image # 17/5 likely  represents reactive adenopathy related to the inflammatory process within the gallbladder. No additional pathologic adenopathy within the abdomen. The abdominal vasculature is unremarkable. Other:  None Musculoskeletal: Remote appearing L1 and L5 compression deformities are seen. Nonenhancing lesion within the L3 vertebral body may represent a small cyst or hemangioma. IMPRESSION: 1. MR findings compatible with acute perforated cholecystitis with multiple gallstones and focal perforation of the gallbladder fundus. 2. No evidence of choledocholithiasis. 3. 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 4. 17 mm subcapsular lesion within the lateral aspect of the spleen likely representing the sequela of remote trauma or inflammation or potentially a lesion such as a littoral cell angioma. An inflammatory granuloma of sarcoidosis is considered less likely given its solitary nature. 5. Mild splenomegaly. 6. Remote appearing L1 and L5 compression deformities. Electronically Signed: By: Fidela Salisbury M.D. On: 01/21/2022 00:37    US Abdomen Limited RUQ (LIVER/GB)  Result Date: 01/20/2022 CLINICAL DATA:  Right upper quadrant pain. Recent abnormal appearance on PET EXAM: ULTRASOUND ABDOMEN LIMITED RIGHT UPPER QUADRANT COMPARISON:  PET CT 01/15/22 FINDINGS: Gallbladder: Gallbladder wall is markedly thickened measuring up to 1.1 cm. There is mild hyperemia surrounding the gallbladder. There are portions of the gallbladder wall that are difficult to visualized (series 1, image 14, 28, 29). There are multiple layering gallstones. Sonographic Murphy sign was negative per patient. Common bile duct: Diameter: 6 mm Liver: No focal lesion identified. Slightly increased parenchymal echogenicity. Portal vein is patent on color Doppler imaging with normal direction of blood flow towards the liver. Other: None. IMPRESSION: Cholelithiasis with markedly thickened gallbladder wall, compatible with severe acute cholecystitis. Portions of the gallbladder wall are difficult to visualize, which could suggest gangrenous cholecystitis. Electronically Signed   By: Marin Roberts M.D.   On: 01/20/2022 11:56     Labs:   Basic Metabolic Panel: Recent Labs  Lab 01/20/22 1035 01/21/22 0723 01/22/22 0528 01/23/22 0705 01/24/22 0542  NA 137 142 138 135 136  K 3.5 4.6 3.9 3.6 3.7  CL 103 106 103 99 99  CO2 28 31 29 31 31   GLUCOSE 103* 99 119* 116* 102*  BUN 9 8 9 8 8   CREATININE 0.72 0.78 0.81 0.72 0.43*  CALCIUM 8.8* 8.8* 8.0* 8.2* 8.3*  MG 2.1  --   --   --   --   PHOS 3.3  --   --   --   --    GFR Estimated Creatinine Clearance: 85 mL/min (A) (by C-G formula based on SCr of 0.43 mg/dL (L)). Liver Function Tests: Recent Labs  Lab 01/20/22 1035 01/21/22 0723 01/22/22 0528 01/23/22 0705  AST 43* 41 33 27  ALT 64* 54* 44 38  ALKPHOS 159* 130* 106 90  BILITOT 2.0* 1.5* 1.5* 1.2  PROT 6.9 6.1* 5.8* 5.9*  ALBUMIN 2.7* 2.4* 2.3* 2.3*   Recent Labs  Lab 01/20/22 1035  LIPASE 73*   No results for input(s): "AMMONIA" in the last  168 hours. Coagulation profile Recent Labs  Lab 01/20/22 2127  INR 1.3*    CBC: Recent Labs  Lab 01/20/22 1035 01/21/22 0511 01/22/22 0528 01/23/22 0705 01/24/22 0542  WBC 10.9* 11.1* 13.0* 16.1* 11.7*  NEUTROABS 8.1*  --   --  12.8* 9.1*  HGB 12.7* 11.4* 11.3* 11.2* 11.7*  HCT 39.2 36.7* 36.9* 37.4* 38.3*  MCV 91.6 93.1 93.7 95.9 94.1  PLT 318 337 340 322 225   Cardiac Enzymes:  No results for input(s): "CKTOTAL", "CKMB", "CKMBINDEX", "TROPONINI" in the last 168 hours. BNP: Invalid input(s): "POCBNP" CBG: No results for input(s): "GLUCAP" in the last 168 hours. D-Dimer No results for input(s): "DDIMER" in the last 72 hours. Hgb A1c No results for input(s): "HGBA1C" in the last 72 hours. Lipid Profile No results for input(s): "CHOL", "HDL", "LDLCALC", "TRIG", "CHOLHDL", "LDLDIRECT" in the last 72 hours. Thyroid function studies No results for input(s): "TSH", "T4TOTAL", "T3FREE", "THYROIDAB" in the last 72 hours.  Invalid input(s): "FREET3" Anemia work up No results for input(s): "VITAMINB12", "FOLATE", "FERRITIN", "TIBC", "IRON", "RETICCTPCT" in the last 72 hours. Microbiology Recent Results (from the past 240 hour(s))  Culture, blood (routine x 2)     Status: None (Preliminary result)   Collection Time: 01/20/22 10:35 AM   Specimen: BLOOD  Result Value Ref Range Status   Specimen Description   Final    BLOOD BLOOD LEFT FOREARM Performed at [facility] [address] [place], [place] [address]    Special Requests   Final    BOTTLES DRAWN AEROBIC AND ANAEROBIC Blood Culture adequate volume Performed at [facility] [address], [place], [place] [address]    Culture   Final    NO GROWTH 4 DAYS Performed at [facility], [place] [address], [place], [place] [address]    Report Status PENDING  Incomplete  Culture, blood (routine x 2)     Status: None (Preliminary result)   Collection Time: 01/20/22 10:39 AM    Specimen: BLOOD  Result Value Ref Range Status   Specimen Description   Final    BLOOD LEFT ANTECUBITAL Performed at [facility] [address], [place], [place] [address]    Special Requests   Final    BOTTLES DRAWN AEROBIC AND ANAEROBIC Blood Culture adequate volume Performed at [facility] [address], [place], [place] [address]    Culture   Final    NO GROWTH 4 DAYS Performed at [facility], [place] [address], [place], [place] [address]    Report Status PENDING  Incomplete  Urine Culture     Status: None   Collection Time: 01/20/22 11:40 AM   Specimen: Urine, Clean Catch  Result Value Ref Range Status   Specimen Description   Final    URINE, CLEAN CATCH Performed at [facility], [place] [address] [place], [place] [address]    Special Requests   Final    NONE Performed at [facility], [place] [address] [place], [place] [address]    Culture   Final    NO GROWTH Performed at [facility], [place] [address] [place], [place] [address]    Report Status 01/21/2022 FINAL  Final  Aerobic/Anaerobic Culture w Gram Stain (surgical/deep wound)     Status: None (Preliminary result)   Collection Time: 01/21/22  9:02 AM   Specimen: Gallbladder; Abscess  Result Value Ref Range Status   Specimen Description   Final    GALL BLADDER ABSCESS Performed at [facility] [address] [place], [place] [address]    Special Requests   Final    NONE Performed at [facility], [place] [address] [place], [place] [address]    Gram Stain   Final    ABUNDANT WBC PRESENT, PREDOMINANTLY PMN RARE GRAM NEGATIVE RODS Performed at [facility], [place] [address] [place], [place] [address]  Culture   Final    ABUNDANT ESCHERICHIA COLI NO ANAEROBES ISOLATED; CULTURE IN PROGRESS FOR 5 DAYS    Report Status PENDING  Incomplete   Organism ID, Bacteria ESCHERICHIA COLI   Final      Susceptibility   Escherichia coli - MIC*    AMPICILLIN <=2 SENSITIVE Sensitive     CEFAZOLIN <=4 SENSITIVE Sensitive     CEFEPIME <=0.12 SENSITIVE Sensitive     CEFTAZIDIME <=1 SENSITIVE Sensitive     CEFTRIAXONE <=0.25 SENSITIVE Sensitive     CIPROFLOXACIN <=0.25 SENSITIVE Sensitive     GENTAMICIN <=1 SENSITIVE Sensitive     IMIPENEM <=0.25 SENSITIVE Sensitive     TRIMETH/SULFA <=20 SENSITIVE Sensitive     AMPICILLIN/SULBACTAM <=2 SENSITIVE Sensitive     PIP/TAZO <=4 SENSITIVE Sensitive     * ABUNDANT ESCHERICHIA COLI    Time coordinating discharge: 35 minutes  Signed: Solomia Harrell  [facility] 01/24/2022, 11:07 AM

## 2022-01-24 NOTE — Progress Notes (Signed)
Pt being discharged to home. Pt is wanting to drive himself home and he states that he feels safe to drive home. MD was notified and aware of pt driving himself home. Discharge instructions and medication education provided to pt. RN went over drain care and instructions on how to flush drain, empty drain, record output, and how to change dressing. Informed pt to call if he has any further questions or concerns.

## 2022-01-24 NOTE — Progress Notes (Signed)
Mobility Specialist - Progress Note   01/24/22 1009  Mobility  Activity Ambulated with assistance in hallway  Level of Assistance Contact guard assist, steadying assist  Assistive Device Cane  Distance Ambulated (ft) 500 ft  Activity Response Tolerated well  Mobility Referral Yes  $Mobility charge 1 Mobility   Pt received in bed and agreed to mobility, some dizziness upon standing from sitting. Slowly faded away, pt returned to bed with all needs met.   Roderick Pee Mobility Specialist

## 2022-01-24 NOTE — Progress Notes (Signed)
[facility] Progress Note     Subjective: CC-  Pain has improved, he states he has slight tenderness over RUQ of his abdomen especially when engaging his core to sit up. He is able is ambulate himself to the bathroom and around the hospital floor with a cane. He is tolerating a regular diet with no nausea or vomiting, is able to pass flatus from his bottom, urinate with no issues, and has not has a bowel movement since Wednesday. Pt. Complains of a worsening productive cough that has developed in the past 2 days while in the hospital. Pt. Denies any nausea, vomiting, shortness of breath, fever, or chills.   Objective: Vital signs in last 24 hours: Temp:  [97.8 F (36.6 C)-98.3 F (36.8 C)] 97.8 F (36.6 C) (11/05 0514) Pulse Rate:  [59-64] 59 (11/05 0514) Resp:  [16-20] 18 (11/05 0514) BP: (95-104)/(59-64) 104/64 (11/05 0514) SpO2:  [98 %-100 %] 98 % (11/05 0514) Last BM Date : 01/20/22  Intake/Output from previous day: 11/04 0701 - 11/05 0700 In: 710 [P.O.:600; IV Piggyback:100] Out: 2285 [Urine:2250; Drains:35] Intake/Output this shift: No intake/output data recorded.  PE: Gen:  Alert, NAD, pleasant Abd:  Soft and tenderness to palpation around RUQ around drain, perc chole drain in place with minimal drainage   Lab Results:  Recent Labs    01/23/22 0705 01/24/22 0542  WBC 16.1* 11.7*  HGB 11.2* 11.7*  HCT 37.4* 38.3*  PLT 322 225   BMET Recent Labs    01/23/22 0705 01/24/22 0542  NA 135 136  K 3.6 3.7  CL 99 99  CO2 31 31  GLUCOSE 116* 102*  BUN 8 8  CREATININE 0.72 0.43*  CALCIUM 8.2* 8.3*   PT/INR No results for input(s): "LABPROT", "INR" in the last 72 hours. CMP     Component Value Date/Time   NA 136 01/24/2022 0542   NA 143 03/10/2017 0911   K 3.7 01/24/2022 0542   K 4.7 03/10/2017 0911   CL 99 01/24/2022 0542   CO2 31 01/24/2022 0542   CO2 30 (H) 03/10/2017 0911   GLUCOSE 102 (H) 01/24/2022 0542   GLUCOSE 90 03/10/2017 0911    BUN 8 01/24/2022 0542   BUN 10.7 03/10/2017 0911   CREATININE 0.43 (L) 01/24/2022 0542   CREATININE 0.68 12/28/2021 1219   CREATININE 0.8 03/10/2017 0911   CALCIUM 8.3 (L) 01/24/2022 0542   CALCIUM 9.4 03/10/2017 0911   PROT 5.9 (L) 01/23/2022 0705   PROT 7.6 03/10/2017 0911   ALBUMIN 2.3 (L) 01/23/2022 0705   ALBUMIN 4.1 03/10/2017 0911   AST 27 01/23/2022 0705   AST 21 12/28/2021 1219   AST 34 03/10/2017 0911   ALT 38 01/23/2022 0705   ALT 17 12/28/2021 1219   ALT 45 03/10/2017 0911   ALKPHOS 90 01/23/2022 0705   ALKPHOS 76 03/10/2017 0911   BILITOT 1.2 01/23/2022 0705   BILITOT 1.3 (H) 12/28/2021 1219   BILITOT 1.41 (H) 03/10/2017 0911   GFRNONAA >60 01/24/2022 0542   GFRNONAA >60 12/28/2021 1219   GFRAA >60 12/24/2019 1042   Lipase     Component Value Date/Time   LIPASE 73 (H) 01/20/2022 1035       Studies/Results: DG Chest Port 1 View  Result Date: 01/23/2022 CLINICAL DATA:  Dyspnea. Productive cough. History of COPD and lung cancer. EXAM: PORTABLE CHEST 1 VIEW COMPARISON:  Chest CT 12/28/2021. PET CT 01/15/2022 FINDINGS: Chronic volume loss in the left hemithorax with ill-defined opacity at  the left lung apex. Similar findings to recent chest CT allowing for differences in modality. Background emphysema with chronic hyperinflation. No evidence of acute airspace disease. No pneumothorax or large pleural effusion. IMPRESSION: 1. Chronic volume loss in the left hemithorax with ill-defined opacity at the left lung apex. Similar findings to recent chest CT allowing for differences in modality. 2. Background emphysema. 3. No acute radiographic findings. Electronically Signed   By: Keith Rake M.D.   On: 01/23/2022 16:36    Anti-infectives: Anti-infectives (From admission, onward)    Start     Dose/Rate Route Frequency Ordered Stop   01/22/22 2200  piperacillin-tazobactam (ZOSYN) IVPB 3.375 g        3.375 g 12.5 mL/hr over 240 Minutes Intravenous Every 8 hours  01/22/22 1654     01/20/22 2000  piperacillin-tazobactam (ZOSYN) IVPB 3.375 g        3.375 g 12.5 mL/hr over 240 Minutes Intravenous Every 8 hours 01/20/22 1356 01/23/22 0958   01/20/22 1300  piperacillin-tazobactam (ZOSYN) IVPB 3.375 g        3.375 g 100 mL/hr over 30 Minutes Intravenous  Once 01/20/22 1254 01/20/22 1406        Assessment/Plan  Gangrenous, perforated cholecystitis  - S/p IR perc chole drain 11/2 -cxs - prelim with E coli -cont abx therapy, WBC 11.7K 11/5 -cont regular diet       ID - zosyn FEN - regular diet  VTE - may resume DOAC when ok with IR     Plan: follow up being arranged in our office in 6 weeks -will need IR follow up for drain clinic as well  I reviewed last 24 h vitals and pain scores, last 48 h intake and output, last 24 h labs and trends, and last 24 h imaging results.    LOS: 4 days    Randel Books, [facility] 01/24/2022, 7:42 AM

## 2022-01-25 ENCOUNTER — Telehealth: Payer: Self-pay

## 2022-01-25 ENCOUNTER — Other Ambulatory Visit (HOSPITAL_COMMUNITY): Payer: Self-pay

## 2022-01-25 LAB — CULTURE, BLOOD (ROUTINE X 2)
Culture: NO GROWTH
Culture: NO GROWTH
Special Requests: ADEQUATE
Special Requests: ADEQUATE

## 2022-01-25 NOTE — Telephone Encounter (Signed)
Transition Care Management Follow-up Telephone Call Date of discharge and from where: [place] 01-24-22 Dx: acute cholecystitis  How have you been since you were released from the hospital? Doing better  Any questions or concerns? No  Items Reviewed: Did the pt receive and understand the discharge instructions provided? Yes  Medications obtained and verified? Yes  Other? No  Any new allergies since your discharge? No  Dietary orders reviewed? Yes Do you have support at home? Yes   Home Care and Equipment/Supplies: Were home health services ordered? no If so, what is the name of the agency? na  Has the agency set up a time to come to the patient's home? not applicable Were any new equipment or medical supplies ordered?  No What is the name of the medical supply agency? na Were you able to get the supplies/equipment? not applicable Do you have any questions related to the use of the equipment or supplies? No  Functional Questionnaire: (I = Independent and D = Dependent) ADLs: I  Bathing/Dressing- I  Meal Prep- I  Eating- I  Maintaining continence- I  Transferring/Ambulation- I-cane   Managing Meds- I  Follow up appointments reviewed:  PCP Hospital f/u appt confirmed? Yes  Scheduled to see Dr Alain Marion on 02-01-22 @ [facility] f/u appt confirmed? Yes  Scheduled to see Dr Thermon Leyland on 03-04-22 @ 1050am. Are transportation arrangements needed? No  If their condition worsens, is the pt aware to call PCP or go to the Emergency Dept.? Yes Was the patient provided with contact information for the PCP's office or ED? Yes Was to pt encouraged to call back with questions or concerns? Yes   Juanda Crumble LPN [facility] [phone]

## 2022-01-26 LAB — AEROBIC/ANAEROBIC CULTURE W GRAM STAIN (SURGICAL/DEEP WOUND)

## 2022-01-26 NOTE — Telephone Encounter (Signed)
Noted! Thank you

## 2022-02-01 ENCOUNTER — Encounter: Payer: Self-pay | Admitting: Internal Medicine

## 2022-02-01 ENCOUNTER — Ambulatory Visit (INDEPENDENT_AMBULATORY_CARE_PROVIDER_SITE_OTHER): Payer: Medicare Other | Admitting: Internal Medicine

## 2022-02-01 ENCOUNTER — Other Ambulatory Visit (HOSPITAL_COMMUNITY): Payer: Self-pay | Admitting: Radiology

## 2022-02-01 VITALS — BP 110/62 | HR 82 | Temp 97.9°F | Ht 71.0 in | Wt 165.0 lb

## 2022-02-01 DIAGNOSIS — J310 Chronic rhinitis: Secondary | ICD-10-CM | POA: Diagnosis not present

## 2022-02-01 DIAGNOSIS — K810 Acute cholecystitis: Secondary | ICD-10-CM

## 2022-02-01 DIAGNOSIS — J441 Chronic obstructive pulmonary disease with (acute) exacerbation: Secondary | ICD-10-CM | POA: Diagnosis not present

## 2022-02-01 DIAGNOSIS — I10 Essential (primary) hypertension: Secondary | ICD-10-CM

## 2022-02-01 DIAGNOSIS — C9110 Chronic lymphocytic leukemia of B-cell type not having achieved remission: Secondary | ICD-10-CM

## 2022-02-01 DIAGNOSIS — K8020 Calculus of gallbladder without cholecystitis without obstruction: Secondary | ICD-10-CM

## 2022-02-01 DIAGNOSIS — J449 Chronic obstructive pulmonary disease, unspecified: Secondary | ICD-10-CM | POA: Diagnosis not present

## 2022-02-01 LAB — CBC WITH DIFFERENTIAL/PLATELET
Basophils Absolute: 0.1 10*3/uL (ref 0.0–0.1)
Basophils Relative: 1 % (ref 0.0–3.0)
Eosinophils Absolute: 0.1 10*3/uL (ref 0.0–0.7)
Eosinophils Relative: 0.7 % (ref 0.0–5.0)
HCT: 43.1 % (ref 39.0–52.0)
Hemoglobin: 13.6 g/dL (ref 13.0–17.0)
Lymphocytes Relative: 21.2 % (ref 12.0–46.0)
Lymphs Abs: 2.2 10*3/uL (ref 0.7–4.0)
MCHC: 31.5 g/dL (ref 30.0–36.0)
MCV: 89.5 fl (ref 78.0–100.0)
Monocytes Absolute: 0.9 10*3/uL (ref 0.1–1.0)
Monocytes Relative: 8.9 % (ref 3.0–12.0)
Neutro Abs: 7 10*3/uL (ref 1.4–7.7)
Neutrophils Relative %: 68.2 % (ref 43.0–77.0)
Platelets: 330 10*3/uL (ref 150.0–400.0)
RBC: 4.82 Mil/uL (ref 4.22–5.81)
RDW: 15.6 % — ABNORMAL HIGH (ref 11.5–15.5)
WBC: 10.2 10*3/uL (ref 4.0–10.5)

## 2022-02-01 LAB — COMPREHENSIVE METABOLIC PANEL
ALT: 21 U/L (ref 0–53)
AST: 23 U/L (ref 0–37)
Albumin: 3.6 g/dL (ref 3.5–5.2)
Alkaline Phosphatase: 80 U/L (ref 39–117)
BUN: 6 mg/dL (ref 6–23)
CO2: 34 mEq/L — ABNORMAL HIGH (ref 19–32)
Calcium: 9.5 mg/dL (ref 8.4–10.5)
Chloride: 101 mEq/L (ref 96–112)
Creatinine, Ser: 0.68 mg/dL (ref 0.40–1.50)
GFR: 90.68 mL/min (ref >60.00)
Glucose, Bld: 86 mg/dL (ref 70–99)
Potassium: 4 mEq/L (ref 3.5–5.1)
Sodium: 140 mEq/L (ref 135–145)
Total Bilirubin: 1 mg/dL (ref 0.2–1.2)
Total Protein: 7.4 g/dL (ref 6.0–8.3)

## 2022-02-01 NOTE — Assessment & Plan Note (Signed)
Percutaneous cholecystostomy drain to remain in place at least 6 weeks. Recommend fluoroscopy with injection of the drain in IR to evaluate for patency of the cystic duct. If the duct is patent and general surgery feels patient is stable for cholecystectomy, the drain would be removed at time of surgery. If the duct is patent and general surgery feels patient is NEVER a candidate for cholecystectomy, drain can be capped for a trial. If symptoms recur, then place to gravity bag again. If trial is successful, discuss possible removal of the drain. Patient will follow up with Dr. Serafina Royals in approximately 8 weeks. A scheduler from our office will call the patient with a date/time of his appointment.  Flush the drain once daily with 5 ml NS, empty the bulb regularly and document the output, to change the dressing daily or as needed and to keep the site clean and dry.  Patient has the number to the IR APP office at [facility].  Patient can calls our office with any questions/concerns.   Discussed

## 2022-02-01 NOTE — Assessment & Plan Note (Signed)
Monitoring CBC

## 2022-02-01 NOTE — Assessment & Plan Note (Signed)
Hold Norvasc, Atenolol due to low BP

## 2022-02-01 NOTE — Assessment & Plan Note (Signed)
Doing better.   

## 2022-02-01 NOTE — Assessment & Plan Note (Signed)
Doing fair Recent PET-CT was reviewed

## 2022-02-01 NOTE — Progress Notes (Signed)
Subjective:  Patient ID: Joe [name], male    DOB: [birth year]  Age: 76 y.o. MRN: [id number]  CC: Follow-up   HPI [name] presents for post-hosp f/u for acute cholecystitis, COPD, lung cancer. Pt finished abx  Per hx:  "Admit date: 01/20/2022 Discharge date: 01/24/2022   Admitted From: Home Discharge disposition: Home   Recommendations at discharge:  --5 more days of oral antibiotics  --Drain care per IR: Percutaneous cholecystostomy drain to remain in place at least 6 weeks. Recommend fluoroscopy with injection of the drain in IR to evaluate for patency of the cystic duct. If the duct is patent and general surgery feels patient is stable for cholecystectomy, the drain would be removed at time of surgery. If the duct is patent and general surgery feels patient is NEVER a candidate for cholecystectomy, drain can be capped for a trial. If symptoms recur, then place to gravity bag again. If trial is successful, discuss possible removal of the drain. Patient will follow up with Dr. Serafina Royals in approximately 8 weeks. A scheduler from our office will call the patient with a date/time of his appointment.  Flush the drain once daily with 5 ml NS, empty the bulb regularly and document the output, to change the dressing daily or as needed and to keep the site clean and dry.  Patient has the number to the IR APP office at [facility].  Patient can calls our office with any questions/concerns.      Brief narrative: Joe [name] is a 76 y.o. male with PMH significant for HTN, paroxysmal A-fib, CLL, COPD on 2 L oxygen nasal cannula, hypothyroidism, depression hold alone 11/1, patient presented to ED with complaint of right upper quadrant pain with chills and decreased appetite for 2 weeks.   In the ED, patient was hemodynamically stable  Work-up showed mildly elevated transaminases right upper quadrant ultrasound suggested acute cholecystitis with gallstone.  Started on IV  antibiotics. Admitted to [facility] and IR consulted.   11/2, underwent percutaneous drainage by IR.    Subjective: Patient was seen and examined this morning.  Pleasant elderly Caucasian male.  Lying down in bed.  Not in distress.   Patient was walking earlier on the hallway with morbidly specialist.  On 2 L oxygen nasal cannula. Cough improving. WC count improving.   Hospital course: Gangrenous, perforated cholecystitis with cholelithiasis s/p IR percutaneous cholecystomy drain placement Intra-Op culture grew E. Coli Currently on IV Zosyn.   WBC count up to 16.1 today.  No fever.   Discharge on 5 more days of oral Augmentin with probiotics. Improved LFTs and bilirubin. Leukocytosis improving as well. Drain education by IR Surgery and IR to schedule outpatient follow-up."  Outpatient Medications Prior to Visit  Medication Sig Dispense Refill   albuterol (PROVENTIL) (2.5 MG/3ML) 0.083% nebulizer solution Take 3 mLs (2.5 mg total) by nebulization every 6 (six) hours as needed for wheezing or shortness of breath. 75 mL 12   amLODipine (NORVASC) 5 MG tablet TAKE 1 TABLET BY MOUTH EVERY DAY (Patient taking differently: Take 5 mg by mouth daily.) 90 tablet 1   apixaban (ELIQUIS) 5 MG TABS tablet TAKE 1 TABLET(5 MG) BY MOUTH TWICE DAILY (Patient taking differently: Take 5 mg by mouth 2 (two) times daily.) 180 tablet 1   atenolol (TENORMIN) 25 MG tablet Take 1 tablet (25 mg total) by mouth daily as needed (palpitations). (Patient taking differently: Take 25 mg by mouth daily as needed (prolonged tachycardia).) 90 tablet  0   Cholecalciferol 25 MCG (1000 UT) tablet Take 1,000 Units by mouth daily.     clotrimazole-betamethasone (LOTRISONE) cream Apply topically 2 (two) times daily. (Patient taking differently: Apply 1 Application topically 2 (two) times daily as needed (nail fungus).) 45 g 2   diazepam (VALIUM) 5 MG tablet Take 1 tablet (5 mg total) by mouth every 12 (twelve) hours as  needed for anxiety. (Patient taking differently: Take 5 mg by mouth every 12 (twelve) hours as needed for anxiety (sleep).) 180 tablet 1   levothyroxine (SYNTHROID) 112 MCG tablet TAKE 1 TABLET(112 MCG) BY MOUTH DAILY (Patient taking differently: Take 112 mcg by mouth daily.) 30 tablet 2   Multiple Vitamin (MULTIVITAMIN) tablet Take 1 tablet by mouth daily. Centrum Silver.     Polyethyl Glycol-Propyl Glycol (SYSTANE ULTRA OP) Place 1 drop into both eyes 2 (two) times daily as needed (dry eyes).     Sodium Chloride Flush (NORMAL SALINE FLUSH) 0.9 % SOLN Flush gallbladder drain with 5-10 ml once daily 560 mL 0   No facility-administered medications prior to visit.    ROS: Review of Systems  Constitutional:  Positive for fatigue. Negative for appetite change, chills and unexpected weight change.  HENT:  Negative for congestion, nosebleeds, sneezing, sore throat and trouble swallowing.   Eyes:  Negative for itching and visual disturbance.  Respiratory:  Positive for shortness of breath. Negative for cough.   Cardiovascular:  Negative for chest pain, palpitations and leg swelling.  Gastrointestinal:  Positive for abdominal pain. Negative for abdominal distention, blood in stool, diarrhea, nausea and vomiting.  Genitourinary:  Negative for frequency and hematuria.  Musculoskeletal:  Positive for arthralgias, back pain and gait problem. Negative for joint swelling and neck pain.  Skin:  Negative for rash.  Neurological:  Negative for dizziness, tremors, speech difficulty and weakness.  Psychiatric/Behavioral:  Negative for agitation, dysphoric mood and sleep disturbance. The patient is not nervous/anxious.     Objective:  BP 110/62 (BP Location: Left Arm, Patient Position: Sitting, Cuff Size: Normal)   Pulse 82   Temp 97.9 F (36.6 C) (Oral)   Ht 5\' 11"  (1.803 m)   Wt 165 lb (74.8 kg)   SpO2 98%   BMI 23.01 kg/m   BP Readings from Last 3 Encounters:  02/01/22 110/62  01/24/22 (!) 123/57   12/31/21 (!) 141/74    Wt Readings from Last 3 Encounters:  02/01/22 165 lb (74.8 kg)  01/20/22 178 lb (80.7 kg)  12/31/21 178 lb 9 oz (81 kg)    Physical Exam Constitutional:      General: He is not in acute distress.    Appearance: He is well-developed.     Comments: NAD  Eyes:     Conjunctiva/sclera: Conjunctivae normal.     Pupils: Pupils are equal, round, and reactive to light.  Neck:     Thyroid: No thyromegaly.     Vascular: No JVD.  Cardiovascular:     Rate and Rhythm: Normal rate and regular rhythm.     Heart sounds: Normal heart sounds. No murmur heard.    No friction rub. No gallop.  Pulmonary:     Effort: Pulmonary effort is normal. No respiratory distress.     Breath sounds: Normal breath sounds. No wheezing or rales.  Chest:     Chest wall: No tenderness.  Abdominal:     General: Bowel sounds are normal. There is no distension.     Palpations: Abdomen is soft. There is no mass.  Tenderness: There is no abdominal tenderness. There is no guarding or rebound.  Musculoskeletal:        General: No tenderness. Normal range of motion.     Cervical back: Normal range of motion.  Lymphadenopathy:     Cervical: No cervical adenopathy.  Skin:    General: Skin is warm and dry.     Findings: No rash.  Neurological:     Mental Status: He is alert and oriented to person, place, and time.     Cranial Nerves: No cranial nerve deficit.     Motor: No abnormal muscle tone.     Coordination: Coordination normal.     Gait: Gait normal.     Deep Tendon Reflexes: Reflexes are normal and symmetric.  Psychiatric:        Behavior: Behavior normal.        Thought Content: Thought content normal.        Judgment: Judgment normal.   On O2 GB drain in the RUQ Sensitive abdomen   Lab Results  Component Value Date   WBC 11.7 (H) 01/24/2022   HGB 11.7 (L) 01/24/2022   HCT 38.3 (L) 01/24/2022   PLT 225 01/24/2022   GLUCOSE 102 (H) 01/24/2022   CHOL 153 11/23/2018    TRIG 201.0 (H) 11/23/2018   HDL 38.20 (L) 11/23/2018   LDLDIRECT 99.0 11/23/2018   LDLCALC 80 11/07/2017   ALT 38 01/23/2022   AST 27 01/23/2022   NA 136 01/24/2022   K 3.7 01/24/2022   CL 99 01/24/2022   CREATININE 0.43 (L) 01/24/2022   BUN 8 01/24/2022   CO2 31 01/24/2022   TSH 8.92 (H) 07/22/2021   PSA 0.06 (L) 07/22/2021   INR 1.3 (H) 01/20/2022    IR Perc Cholecystostomy  Result Date: 01/21/2022 INDICATION: 76 year old with history of acute calculus cholecystitis, poor surgical candidate. EXAM: Ultrasound and fluoroscopic guided percutaneous cholecystostomy tube placement MEDICATIONS: The patient was receiving intravenous antibiotics as an inpatient.; The antibiotics were administered within an appropriate time frame prior to the initiation of the procedure. ANESTHESIA/SEDATION: Moderate (conscious) sedation was employed during this procedure. A total of Versed 2 mg and Fentanyl 100 mcg was administered intravenously. Moderate Sedation Time: 10 minutes. The patient's level of consciousness and vital signs were monitored continuously by radiology nursing throughout the procedure under my direct supervision. FLUOROSCOPY TIME:  Four mGy COMPLICATIONS: None immediate. PROCEDURE: Informed written consent was obtained from the patient after a thorough discussion of the procedural risks, benefits and alternatives. All questions were addressed. Maximal Sterile Barrier Technique was utilized including caps, mask, sterile gowns, sterile gloves, sterile drape, hand hygiene and skin antiseptic. A timeout was performed prior to the initiation of the procedure. The patient was placed supine on the angiographic table. The patient's right upper quadrant was then prepped and draped in normal sterile fashion with maximum sterile barrier. Ultrasound demonstrates a distended gallbladder with multiple echogenic gallstones. Subdermal Local anesthesia was provided at the planned skin entry site. Under ultrasound  guidance, deeper local anesthetic was provided through intercostal muscles and along the liver capsule. Ultrasound was used to puncture the gallbladder using a 18 gauge trocar needle via a subcostal transhepatic approach with visualization of the lung treated to the gallbladder. There was efflux of thick, purulent fluid when the stylet was removed. A 0.035 inch exchange wire was placed in the tract was dilated. A 10.2 French multipurpose drainage catheter was advanced into the gallbladder lumen. The drain was then secured in place using  a 0-silk suture and a Stayfix device. Approximately 40 of purulent fluid was aspirated and a sample sent culture. A sterile dressing was applied. The tube was placed to bulb suction. The patient tolerated procedure well without evidence of immediate complication was transferred back to the floor in stable condition. IMPRESSION: Successful placement of 10.2 French percutaneous, subcostal transhepatic cholecystostomy tube. PLAN: If the patient is deemed a poor surgical candidate indefinitely, he would likely benefit from percutaneous choledochoscopic (Spyglass) gallstone retrieval and hopefully eventual tube removal. IR will arrange for 8 week follow-up cholecystostomy tube check and exchange and will discuss further plan with the patient at that time. Ruthann Cancer, MD [facility] Radiology Electronically Signed   By: Ruthann Cancer M.D.   On: 01/21/2022 09:24   MR ABDOMEN MRCP W WO CONTAST  Addendum Date: 01/21/2022   ADDENDUM REPORT: 01/21/2022 01:14 ADDENDUM: In regards to the splenic lesion, while this may reflect the sequela of remote trauma or inflammation or a nonaggressive lesion such as a littoral cell angioma, low-grade lymphoma should also be considered in the differential despite its lack of hypermetabolism on PET CT examination of 01/15/2022 and lack of significant restricted diffusion. In absence of prior contrast  enhanced examinations, a follow-up examination in 6-12 months may be helpful to ensure continued stability. Electronically Signed   By: Fidela Salisbury M.D.   On: 01/21/2022 01:14   Result Date: 01/21/2022 CLINICAL DATA:  Cholelithiasis, choledocholithiasis EXAM: MRI ABDOMEN WITHOUT AND WITH CONTRAST (INCLUDING MRCP) TECHNIQUE: Multiplanar multisequence MR imaging of the abdomen was performed both before and after the administration of intravenous contrast. Heavily T2-weighted images of the biliary and pancreatic ducts were obtained, and three-dimensional MRCP images were rendered by post processing. CONTRAST:  80mL GADAVIST GADOBUTROL 1 MMOL/ML IV SOLN COMPARISON:  None Available. FINDINGS: Lower chest: No acute findings. Hepatobiliary: Normal hepatic parenchymal signal intensity. 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 No other intrahepatic lesions are identified. No intrahepatic biliary ductal dilation. The gallbladder contains multiple gallstones. The gallbladder is thick walled. There is hyperemia of the gallbladder wall with irregularity of the wall seen within the fundus suggesting areas of focal perforation, best appreciated on image # 18/5 and image # 47/19. There is extensive surrounding edema. Mild reactive hyperemia within the adjacent gallbladder fossa. Together, the findings are in keeping with acute perforated cholecystitis. No intra or extrahepatic biliary ductal dilation. No choledocholithiasis. Pancreas: No mass, inflammatory changes, or other parenchymal abnormality identified. Spleen: A 17 mm subcapsular lesion is seen within the lateral aspect of the spleen at axial image # 13/5 which is hypointense on T1 and T2 weighted imaging and demonstrates  relative hypoenhancement and no restricted diffusion likely representing the sequela of remote trauma or inflammation or potentially a lesion such as a littoral cell angioma. An inflammatory granuloma of sarcoidosis is considered less likely given its solitary nature. Mild splenomegaly is present with the spleen measuring 14.2 cm in greatest dimension. Adrenals/Urinary Tract: Adrenal glands are unremarkable. The kidneys are normal in size and position. Multiple simple cortical and parapelvic cysts are seen bilaterally. No follow-up imaging is recommended for these lesions. Mild bilateral nonspecific perinephric edema is present. No enhancing intrarenal masses. No hydronephrosis. Stomach/Bowel: Small duodenal diverticulum. The visualized large and small bowel are  otherwise unremarkable. Vascular/Lymphatic: Enlarged periportal lymph node seen at axial image # 17/5 likely represents reactive adenopathy related to the inflammatory process within the gallbladder. No additional pathologic adenopathy within the abdomen. The abdominal vasculature is unremarkable. Other:  None Musculoskeletal: Remote appearing L1 and L5 compression deformities are seen. Nonenhancing lesion within the L3 vertebral body may represent a small cyst or hemangioma. IMPRESSION: 1. MR findings compatible with acute perforated cholecystitis with multiple gallstones and focal perforation of the gallbladder fundus. 2. No evidence of choledocholithiasis. 3. 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 4. 17 mm subcapsular lesion within the lateral aspect of the spleen likely representing the sequela of remote trauma or inflammation or potentially a lesion such as a littoral cell angioma. An inflammatory granuloma of sarcoidosis is considered less likely given its solitary nature. 5. Mild splenomegaly. 6. Remote appearing L1 and L5 compression deformities. Electronically Signed: By:  Fidela Salisbury M.D. On: 01/21/2022 00:37   MR 3D Recon At Scanner  Addendum Date: 01/21/2022   ADDENDUM REPORT: 01/21/2022 01:14 ADDENDUM: In regards to the splenic lesion, while this may reflect the sequela of remote trauma or inflammation or a nonaggressive lesion such as a littoral cell angioma, low-grade lymphoma should also be considered in the differential despite its lack of hypermetabolism on PET CT examination of 01/15/2022 and lack of significant restricted diffusion. In absence of prior contrast enhanced examinations, a follow-up examination in 6-12 months may be helpful to ensure continued stability. Electronically Signed   By: Fidela Salisbury M.D.   On: 01/21/2022 01:14   Result Date: 01/21/2022 CLINICAL DATA:  Cholelithiasis, choledocholithiasis EXAM: MRI ABDOMEN WITHOUT AND WITH CONTRAST (INCLUDING MRCP) TECHNIQUE: Multiplanar multisequence MR imaging of the abdomen was performed both before and after the administration of intravenous contrast. Heavily T2-weighted images of the biliary and pancreatic ducts were obtained, and three-dimensional MRCP images were rendered by post processing. CONTRAST:  60mL GADAVIST GADOBUTROL 1 MMOL/ML IV SOLN COMPARISON:  None Available. FINDINGS: Lower chest: No acute findings. Hepatobiliary: Normal hepatic parenchymal signal intensity. 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 No other intrahepatic lesions are identified. No intrahepatic biliary ductal dilation. The gallbladder contains multiple gallstones. The gallbladder is thick walled. There is hyperemia of the gallbladder wall with irregularity of the wall seen within the fundus suggesting areas of focal perforation, best appreciated on image # 18/5 and image #  47/19. There is extensive surrounding edema. Mild reactive hyperemia within the adjacent gallbladder fossa. Together, the findings are in keeping with acute perforated cholecystitis. No intra or extrahepatic biliary ductal dilation. No choledocholithiasis. Pancreas: No mass, inflammatory changes, or other parenchymal abnormality identified. Spleen: A 17 mm subcapsular lesion is seen within the lateral aspect of the spleen at axial image # 13/5 which is hypointense on T1 and T2 weighted imaging and demonstrates relative hypoenhancement and no restricted diffusion likely representing the sequela of remote trauma or inflammation or potentially a lesion such as a littoral cell angioma. An inflammatory granuloma of sarcoidosis is considered less likely given its solitary nature. Mild splenomegaly is present with the spleen measuring 14.2 cm in greatest dimension. Adrenals/Urinary Tract: Adrenal glands are unremarkable. The kidneys are normal in size and position. Multiple simple cortical and  parapelvic cysts are seen bilaterally. No follow-up imaging is recommended for these lesions. Mild bilateral nonspecific perinephric edema is present. No enhancing intrarenal masses. No hydronephrosis. Stomach/Bowel: Small duodenal diverticulum. The visualized large and small bowel are otherwise unremarkable. Vascular/Lymphatic: Enlarged periportal lymph node seen at axial image # 17/5 likely represents reactive adenopathy related to the inflammatory process within the gallbladder. No additional pathologic adenopathy within the abdomen. The abdominal vasculature is unremarkable. Other:  None Musculoskeletal: Remote appearing L1 and L5 compression deformities are seen. Nonenhancing lesion within the L3 vertebral body may represent a small cyst or hemangioma. IMPRESSION: 1. MR findings compatible with acute perforated cholecystitis with multiple gallstones and focal perforation of the gallbladder fundus. 2. No evidence of  choledocholithiasis. 3. 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 4. 17 mm subcapsular lesion within the lateral aspect of the spleen likely representing the sequela of remote trauma or inflammation or potentially a lesion such as a littoral cell angioma. An inflammatory granuloma of sarcoidosis is considered less likely given its solitary nature. 5. Mild splenomegaly. 6. Remote appearing L1 and L5 compression deformities. Electronically Signed: By: Fidela Salisbury M.D. On: 01/21/2022 00:37   US Abdomen Limited RUQ (LIVER/GB)  Result Date: 01/20/2022 CLINICAL DATA:  Right upper quadrant pain. Recent abnormal appearance on PET EXAM: ULTRASOUND ABDOMEN LIMITED RIGHT UPPER QUADRANT COMPARISON:  PET CT 01/15/22 FINDINGS: Gallbladder: Gallbladder wall is markedly thickened measuring up to 1.1 cm. There is mild hyperemia surrounding the gallbladder. There are portions of the gallbladder wall that are difficult to visualized (series 1, image 14, 28, 29). There are multiple layering gallstones. Sonographic Murphy sign was negative per patient. Common bile duct: Diameter: 6 mm Liver: No focal lesion identified. Slightly increased parenchymal echogenicity. Portal vein is patent on color Doppler imaging with normal direction of blood flow towards the liver. Other: None. IMPRESSION: Cholelithiasis with markedly thickened gallbladder wall, compatible with severe acute cholecystitis. Portions of the gallbladder wall are difficult to visualize, which could suggest gangrenous cholecystitis. Electronically Signed   By: Marin Roberts M.D.   On: 01/20/2022 11:56    Assessment & Plan:   Problem List Items Addressed This Visit     Essential hypertension    Hold Norvasc, Atenolol due to low BP      COPD mixed type (HCC)    Doing fair Recent PET-CT was reviewed      CLL (chronic lymphocytic leukemia) (HCC)    Monitoring CBC      Gallstones -  Primary    Percutaneous cholecystostomy drain to remain in place at least 6 weeks. Recommend fluoroscopy with injection of the drain in IR to evaluate for patency of the cystic duct. If the duct is patent and general surgery feels patient is stable for cholecystectomy, the drain would be removed at time of surgery. If the duct is patent and general surgery feels patient is NEVER a candidate for cholecystectomy, drain can be capped for a trial. If symptoms recur, then place to gravity bag again. If trial is successful, discuss possible removal of the drain. Patient will follow up with Dr. Serafina Royals in approximately 8 weeks. A scheduler from our office will call the patient with a date/time of his appointment.  Flush the drain once daily with 5 ml NS, empty the bulb regularly and document the output, to change the dressing daily or as needed and to keep the site clean and dry.  Patient has the  number to the IR APP office at [facility].  Patient can calls our office with any questions/concerns.   Discussed      Relevant Orders   CBC with Differential/Platelet   Comprehensive metabolic panel   COPD exacerbation ([place])    Doing better      Rhinitis    Dryness due to O2 use Use NS spray, Flonase         No orders of the defined types were placed in this encounter.     Follow-up: Return in about 2 months (around 04/03/2022) for a follow-up visit.  Walker Kehr, MD

## 2022-02-01 NOTE — Assessment & Plan Note (Signed)
Dryness due to O2 use Use NS spray, Flonase

## 2022-02-02 ENCOUNTER — Other Ambulatory Visit (HOSPITAL_COMMUNITY): Payer: Self-pay | Admitting: Radiology

## 2022-02-02 ENCOUNTER — Ambulatory Visit (HOSPITAL_COMMUNITY)
Admission: RE | Admit: 2022-02-02 | Discharge: 2022-02-02 | Disposition: A | Discharge status: Home / Self Care | Payer: Medicare Other | Source: Ambulatory Visit | Attending: Radiology | Admitting: Radiology

## 2022-02-02 DIAGNOSIS — K810 Acute cholecystitis: Secondary | ICD-10-CM

## 2022-02-02 DIAGNOSIS — Z434 Encounter for attention to other artificial openings of digestive tract: Secondary | ICD-10-CM | POA: Insufficient documentation

## 2022-02-02 HISTORY — PX: IR EXCHANGE BILIARY DRAIN: IMG6046

## 2022-02-02 HISTORY — PX: IR CHOLANGIOGRAM EXISTING TUBE: IMG6040

## 2022-02-02 MED ORDER — IOHEXOL 300 MG/ML  SOLN
50.0000 mL | Freq: Once | INTRAMUSCULAR | Status: AC | PRN
  Administered 2022-02-02: 15 mL

## 2022-02-02 MED ORDER — LIDOCAINE HCL 1 % IJ SOLN
INTRAMUSCULAR | Status: AC
  Administered 2022-02-02: 4 mL via INTRADERMAL
  Filled 2022-02-02: qty 20

## 2022-02-02 NOTE — Procedures (Signed)
Interventional Radiology Procedure Note  Procedure: Image guided drain exchange, perc chole.  42F pigtail drain placed, based on the viscosity of the purulent material. To bulb.  Complications: None  Recommendations: - Routine drain care, with sterile flushes, record output - follow up sx and VIR appt    Signed,  Dulcy Fanny. Earleen Newport, DO

## 2022-02-03 ENCOUNTER — Encounter (HOSPITAL_COMMUNITY): Payer: Self-pay | Admitting: Radiology

## 2022-02-03 ENCOUNTER — Other Ambulatory Visit (HOSPITAL_COMMUNITY): Payer: Self-pay | Admitting: Radiology

## 2022-02-03 DIAGNOSIS — K810 Acute cholecystitis: Secondary | ICD-10-CM

## 2022-02-04 ENCOUNTER — Other Ambulatory Visit (HOSPITAL_COMMUNITY): Payer: Medicare Other

## 2022-02-06 ENCOUNTER — Other Ambulatory Visit: Payer: Self-pay | Admitting: Physician Assistant

## 2022-02-06 DIAGNOSIS — E039 Hypothyroidism, unspecified: Secondary | ICD-10-CM

## 2022-02-08 ENCOUNTER — Other Ambulatory Visit: Payer: Self-pay | Admitting: Physician Assistant

## 2022-02-08 DIAGNOSIS — E039 Hypothyroidism, unspecified: Secondary | ICD-10-CM

## 2022-02-12 ENCOUNTER — Other Ambulatory Visit (HOSPITAL_COMMUNITY): Payer: Self-pay

## 2022-02-15 ENCOUNTER — Other Ambulatory Visit (HOSPITAL_COMMUNITY): Payer: Self-pay

## 2022-02-18 ENCOUNTER — Telehealth (HOSPITAL_COMMUNITY): Payer: Self-pay | Admitting: Cardiology

## 2022-02-18 NOTE — Telephone Encounter (Signed)
Patient cancelled due to wearing a drain tube for a gall bladder infection. He will eventually have surgery and just wants Dr. Radford Pax to know that when he is well that he will have echocardiogram. Orders will be removed from the [place] and when patient calls back we will reinstate the order. Thank you

## 2022-02-22 ENCOUNTER — Other Ambulatory Visit: Payer: Self-pay

## 2022-02-22 ENCOUNTER — Inpatient Hospital Stay: Payer: Medicare Other | Attending: Internal Medicine

## 2022-02-22 ENCOUNTER — Inpatient Hospital Stay (HOSPITAL_BASED_OUTPATIENT_CLINIC_OR_DEPARTMENT_OTHER): Payer: Medicare Other | Admitting: Internal Medicine

## 2022-02-22 ENCOUNTER — Telehealth: Payer: Self-pay | Admitting: Internal Medicine

## 2022-02-22 VITALS — BP 137/73 | HR 73 | Temp 98.1°F | Resp 19 | Wt 171.6 lb

## 2022-02-22 DIAGNOSIS — C9110 Chronic lymphocytic leukemia of B-cell type not having achieved remission: Secondary | ICD-10-CM | POA: Diagnosis not present

## 2022-02-22 DIAGNOSIS — C3490 Malignant neoplasm of unspecified part of unspecified bronchus or lung: Secondary | ICD-10-CM | POA: Diagnosis not present

## 2022-02-22 DIAGNOSIS — Z79899 Other long term (current) drug therapy: Secondary | ICD-10-CM | POA: Diagnosis not present

## 2022-02-22 DIAGNOSIS — C3412 Malignant neoplasm of upper lobe, left bronchus or lung: Secondary | ICD-10-CM | POA: Diagnosis not present

## 2022-02-22 DIAGNOSIS — Z87891 Personal history of nicotine dependence: Secondary | ICD-10-CM | POA: Diagnosis not present

## 2022-02-22 DIAGNOSIS — Z923 Personal history of irradiation: Secondary | ICD-10-CM | POA: Insufficient documentation

## 2022-02-22 DIAGNOSIS — I10 Essential (primary) hypertension: Secondary | ICD-10-CM | POA: Diagnosis not present

## 2022-02-22 DIAGNOSIS — E039 Hypothyroidism, unspecified: Secondary | ICD-10-CM

## 2022-02-22 DIAGNOSIS — K8010 Calculus of gallbladder with chronic cholecystitis without obstruction: Secondary | ICD-10-CM | POA: Insufficient documentation

## 2022-02-22 LAB — TSH: TSH: 9.336 u[IU]/mL — ABNORMAL HIGH (ref 0.350–4.500)

## 2022-02-22 NOTE — Progress Notes (Signed)
[place] [phone]   [phone]  OFFICE PROGRESS NOTE  [name], [name], MD [place] [address]  DIAGNOSIS: 1) Stage IIIb non-small cell lung cancer, squamous cell carcinoma.  The patient presented with a left upper lobe lung mass as well as associated mediastinal lymphadenopathy and contralateral right hilar mild hypermetabolism.  There was heterogeneous marrow uptake in the spine but with more focal areas at L4 without imaging correlation on CT scan.  The patient was diagnosed in August 2021. 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  First dose expected on 12/13/2019. Status post 5 cycles.   Last dose was giving January 14, 2020. 2) Consolidation treatment with immunotherapy with Imfinzi 1500 mg IV every 4 weeks.  First dose February 19, 2020.  Status post 13 cycles.   CURRENT THERAPY: Observation.  INTERVAL HISTORY: Joe [name] 76 y.o. male returns to the clinic today for follow-up visit.  The patient is feeling fine today with no concerning complaints except for the biliary drain that has been inserted in early November 2023 after the patient was found on PET scan to have concerning cholecystitis.  He was seen at the emergency department at that time and because of the acute infection he underwent biliary drain rather than cholecystectomy at that time.  He is followed by interventional radiology as well as general surgery for consideration of cholecystectomy in the future.  He continues to have the baseline shortness of breath with mild cough with no chest pain or hemoptysis.  He has no nausea, vomiting, diarrhea or constipation.  He has no headache or visual changes.  He is here today for evaluation and discussion of his PET scan results and any further recommendation  regarding his lung cancer.  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   chronic stage 1- no symtoms   CLL (chronic lymphocytic leukemia) ([place]) 08/08/2014   04/21/2014 Dr Burr Medico Stage 0   COPD mixed type ([place]) 07/26/2007   Smoker - stopped 6/14    De Quervain's tenosynovitis, right 12/19/2013   04-21-2013    Depression    at times   Dysrhythmia    PAF   Dysuria Dec 19, 2013   9/15 - poss stricture Urol ref was offered    Gallstones 11/16/2017   Asymptomatic Pt refused surg ref   Generalized anxiety disorder 09/07/2012   Chronic   Potential benefits of a long term steroid  use as well as potential risks  and complications were explained to the patient and were aknowledged.      GERD 12/02/2006   Chronic      Grade I diastolic dysfunction 37/06/8268   Grief May 20, 2016   [name] died in April 21, 2014   Gynecomastia 2013-12-19   Benign B 04-21-13    Hyperlipidemia    Hypertension    Hypothyroidism 12/25/2014   04/21/2014 On Levothyroxine    Intertrigo 02/02/2012   11/13    Neoplasm of uncertain behavior of skin 03/12/2013   12/14 R ear, chest    OSA on CPAP    mild with AHI 9/hr and  oxygen desats as low as 75%   Paresis (HCC)    right- s/p cerv decompression   PERIORBITAL CELLULITIS 02/22/2009   Qualifier: Diagnosis of  By: Diona [place] MD, Amy     Retinal detachment    L>>R    ALLERGIES:  has No Known Allergies.  MEDICATIONS:  Current Outpatient Medications  Medication Sig Dispense Refill   albuterol (PROVENTIL) (2.5 MG/3ML) 0.083% nebulizer solution Take 3 mLs (2.5 mg total) by nebulization every 6 (six) hours as needed for wheezing or shortness of breath. 75 mL 12   amLODipine (NORVASC) 5 MG tablet TAKE 1 TABLET BY MOUTH EVERY DAY (Patient taking differently: Take 5 mg by mouth daily.) 90 tablet 1   apixaban (ELIQUIS) 5 MG TABS tablet  TAKE 1 TABLET(5 MG) BY MOUTH TWICE DAILY (Patient taking differently: Take 5 mg by mouth 2 (two) times daily.) 180 tablet 1   atenolol (TENORMIN) 25 MG tablet Take 1 tablet (25 mg total) by mouth daily as needed (palpitations). (Patient taking differently: Take 25 mg by mouth daily as needed (prolonged tachycardia).) 90 tablet 0   Cholecalciferol 25 MCG (1000 UT) tablet Take 1,000 Units by mouth daily.     clotrimazole-betamethasone (LOTRISONE) cream Apply topically 2 (two) times daily. (Patient taking differently: Apply 1 Application topically 2 (two) times daily as needed (nail fungus).) 45 g 2   diazepam (VALIUM) 5 MG tablet Take 1 tablet (5 mg total) by mouth every 12 (twelve) hours as needed for anxiety. (Patient taking differently: Take 5 mg by mouth every 12 (twelve) hours as needed for anxiety (sleep).) 180 tablet 1   levothyroxine (SYNTHROID) 112 MCG tablet TAKE 1 TABLET(112 MCG) BY MOUTH DAILY 30 tablet 2   Multiple Vitamin (MULTIVITAMIN) tablet Take 1 tablet by mouth daily. Centrum Silver.     Polyethyl Glycol-Propyl Glycol (SYSTANE ULTRA OP) Place 1 drop into both eyes 2 (two) times daily as needed (dry eyes).     Sodium Chloride Flush (NORMAL SALINE FLUSH) 0.9 % SOLN Flush gallbladder drain with 5-10 ml once daily 560 mL 0   No current facility-administered medications for this visit.    SURGICAL HISTORY:  Past Surgical History:  Procedure Laterality Date   BICEPS TENDON REPAIR     BRONCHIAL BIOPSY  10/23/2019   Procedure: BRONCHIAL BIOPSIES;  Surgeon: [name], MD;  Location: [place];  Service: Pulmonary;;   BRONCHIAL BIOPSY  11/20/2019   Procedure: BRONCHIAL BIOPSIES;  Surgeon: [name], MD;  Location: [facility] ENDOSCOPY;  Service: Pulmonary;;   BRONCHIAL BRUSHINGS  10/23/2019   Procedure: BRONCHIAL BRUSHINGS;  Surgeon: [name], MD;  Location[facility] ENDOSCOPY;  Service: Pulmonary;;   BRONCHIAL BRUSHINGS  11/20/2019   Procedure: BRONCHIAL BRUSHINGS;  Surgeon: [name], MD;  Location[facility] ENDOSCOPY;  Service: Pulmonary;;   BRONCHIAL NEEDLE ASPIRATION BIOPSY  10/23/2019   Procedure: BRONCHIAL NEEDLE ASPIRATION BIOPSIES;  Surgeon: [name], MD;  Location: [facility] ENDOSCOPY;  Service: Pulmonary;;   BRONCHIAL NEEDLE ASPIRATION BIOPSY  11/20/2019   Procedure: BRONCHIAL NEEDLE ASPIRATION BIOPSIES;  Surgeon: [name], MD;  Location: [facility] ENDOSCOPY;  Service: Pulmonary;;   BRONCHIAL WASHINGS  11/20/2019   Procedure: BRONCHIAL WASHINGS;  Surgeon: [name], MD;  Location[facility] ENDOSCOPY;  Service: Pulmonary;;   CATARACT EXTRACTION Left    COLONOSCOPY  04-14-99   Dr Flossie Dibble polyp-TA in epic   IR [place]  02/02/2022   IR [place] CHOLECYSTOSTOMY  01/21/2022   POLYPECTOMY  04-14-99  POSTERIOR LAMINECTOMY / DECOMPRESSION CERVICAL SPINE     Dr Saintclair Halsted   RETINAL DETACHMENT SURGERY     left eye, 2007 x2, 2008 x 3   ROTATOR CUFF REPAIR  2004   right   TONSILLECTOMY  [id number]   VIDEO BRONCHOSCOPY WITH ENDOBRONCHIAL NAVIGATION N/A 10/23/2019   Procedure: VIDEO BRONCHOSCOPY WITH ENDOBRONCHIAL NAVIGATION;  Surgeon: [name], MD;  Location: [place] ENDOSCOPY;  Service: Pulmonary;  Laterality: N/A;   VIDEO BRONCHOSCOPY WITH ENDOBRONCHIAL NAVIGATION N/A 11/20/2019   Procedure: VIDEO BRONCHOSCOPY WITH ENDOBRONCHIAL NAVIGATION;  Surgeon: [name], MD;  Location: [place] ENDOSCOPY;  Service: Pulmonary;  Laterality: N/A;   VIDEO BRONCHOSCOPY WITH ENDOBRONCHIAL ULTRASOUND N/A 10/23/2019   Procedure: VIDEO BRONCHOSCOPY WITH ENDOBRONCHIAL ULTRASOUND;  Surgeon: [name], MD;  Location: [facility] ENDOSCOPY;  Service: Pulmonary;  Laterality: N/A;    REVIEW OF SYSTEMS:  Constitutional: positive for fatigue Eyes: negative Ears, nose, mouth, throat, and face: negative Respiratory: positive for dyspnea on exertion Cardiovascular: negative Gastrointestinal: negative Genitourinary:negative Integument/breast: negative Hematologic/lymphatic:  negative Musculoskeletal:negative Neurological: negative Behavioral/Psych: negative Endocrine: negative Allergic/Immunologic: negative   PHYSICAL EXAMINATION: General appearance: alert, cooperative, fatigued, and no distress Head: Normocephalic, without obvious abnormality, atraumatic Neck: no adenopathy, no JVD, supple, symmetrical, trachea midline, and thyroid not enlarged, symmetric, no tenderness/mass/nodules Lymph nodes: Cervical, supraclavicular, and axillary nodes normal. Resp: clear to auscultation bilaterally Back: symmetric, no curvature. ROM normal. No CVA tenderness. Cardio: regular rate and rhythm, S1, S2 normal, no murmur, click, rub or gallop GI: soft, non-tender; bowel sounds normal; no masses,  no organomegaly Extremities: extremities normal, atraumatic, no cyanosis or edema Neurologic: Alert and oriented X 3, normal strength and tone. Normal symmetric reflexes. Normal coordination and gait  ECOG PERFORMANCE STATUS: 1 - Symptomatic but completely ambulatory  Blood pressure 137/73, pulse 73, temperature 98.1 F (36.7 C), temperature source Oral, resp. rate 19, weight 171 lb 9 oz (77.8 kg), SpO2 96 %.  LABORATORY DATA: Lab Results  Component Value Date   WBC 10.2 02/01/2022   HGB 13.6 02/01/2022   HCT 43.1 02/01/2022   MCV 89.5 02/01/2022   PLT 330.0 02/01/2022      Chemistry      Component Value Date/Time   NA 140 02/01/2022 1043   NA 143 03/10/2017 0911   K 4.0 02/01/2022 1043   K 4.7 03/10/2017 0911   CL 101 02/01/2022 1043   CO2 34 (H) 02/01/2022 1043   CO2 30 (H) 03/10/2017 0911   BUN 6 02/01/2022 1043   BUN 10.7 03/10/2017 0911   CREATININE 0.68 02/01/2022 1043   CREATININE 0.68 12/28/2021 1219   CREATININE 0.8 03/10/2017 0911      Component Value Date/Time   CALCIUM 9.5 02/01/2022 1043   CALCIUM 9.4 03/10/2017 0911   ALKPHOS 80 02/01/2022 1043   ALKPHOS 76 03/10/2017 0911   AST 23 02/01/2022 1043   AST 21 12/28/2021 1219   AST 34  03/10/2017 0911   ALT 21 02/01/2022 1043   ALT 17 12/28/2021 1219   ALT 45 03/10/2017 0911   BILITOT 1.0 02/01/2022 1043   BILITOT 1.3 (H) 12/28/2021 1219   BILITOT 1.41 (H) 03/10/2017 0911       RADIOGRAPHIC STUDIES: IR EXCHANGE BILIARY DRAIN  Result Date: 02/02/2022 INDICATION: 76 year old male presents for exchange of percutaneous cholecystostomy secondary to leakage at the tube site EXAM: CHOLANGIOGRAM VIA EXISTING CATHETER MEDICATIONS: None ANESTHESIA/SEDATION: None FLUOROSCOPY TIME:  Fluoroscopy Time:  (3 mGy). COMPLICATIONS: None PROCEDURE: Informed written consent was obtained from the  patient after a thorough discussion of the procedural risks, benefits and alternatives. All questions were addressed. Maximal Sterile Barrier Technique was utilized including caps, mask, sterile gowns, sterile gloves, sterile drape, hand hygiene and skin antiseptic. A timeout was performed prior to the initiation of the procedure. Patient positioned supine under the image intensifier. The right upper quadrant including the drain were prepped and draped in the usual sterile fashion. 1% lidocaine was used for local anesthesia. Contrast was injected confirming tube location within the gallbladder. Modified Seldinger technique was then used to exchange for a new 12 [place] drain. We elected to place a larger tube given the viscosity of the frankly purulent material within the gallbladder lumen. Catheter was formed within the lumen. Catheter was sutured in position and attached to bulb suction. Patient tolerated the procedure well and remained hemodynamically stable throughout. No complications were encountered and no significant blood loss. IMPRESSION: Status post routine exchange of percutaneous cholecystostomy Signed, Dulcy Fanny. Nadene Rubins, RPVI [facility] Rad[facility] Electronically Signed   By: Corrie Mckusick D.O.   On: 02/02/2022 09:49   DG Chest Port 1  View  Result Date: 01/23/2022 CLINICAL DATA:  Dyspnea. Productive cough. History of COPD and lung cancer. EXAM: PORTABLE CHEST 1 VIEW COMPARISON:  Chest CT 12/28/2021. PET CT 01/15/2022 FINDINGS: Chronic volume loss in the left hemithorax with ill-defined opacity at the left lung apex. Similar findings to recent chest CT allowing for differences in modality. Background emphysema with chronic hyperinflation. No evidence of acute airspace disease. No pneumothorax or large pleural effusion. IMPRESSION: 1. Chronic volume loss in the left hemithorax with ill-defined opacity at the left lung apex. Similar findings to recent chest CT allowing for differences in modality. 2. Background emphysema. 3. No acute radiographic findings. Electronically Signed   By: Keith Rake M.D.   On: 01/23/2022 16:36     ASSESSMENT AND PLAN: This is a very pleasant 76 years old white male with a stage IIIb non-small cell lung cancer, squamous cell carcinoma diagnosed in August of 2021.  The patient also has a history of stage 0 CLL.  He underwent a course of concurrent chemoradiation with weekly carboplatin for AUC of 2 and paclitaxel 45 mg/M2 status post 5 cycles of the treatment.  He tolerated this treatment well with no concerning adverse effect except for mild odynophagia and dysphagia as well as thrombocytopenia. His scan showed improvement of his disease with decrease in the size of the left upper lobe lung mass as well as decrease in the mediastinal lymphadenopathy and no evidence of metastatic disease. The patient completed consolidation treatment with immunotherapy with Imfinzi 1500 mg IV every 4 weeks status post 13 cycles. His last CT scan of the chest showed the left upper lobe postradiation changes with associated areas of increased soft tissue concerning for evolving postradiation changes or recurrent disease and PET scan was recommended for further evaluation.  He also had slightly increased size of subcarinal lymph  node.  The patient had a PET scan performed on January 15, 2022 and that showed a stable appearance of the masslike architecture and fibrosis with volume loss involving the left upper lobe and left upper hilar region with low-level tracer uptake with no signs of tracer avid tumor recurrence or metastatic disease.  Incidentally the scan showed gallstones with marked increase in radiotracer uptake localized to the walls of the gallbladder.  The patient was evaluated at the emergency department and he had a biliary drain and  expected to have surgery soon. I had a lengthy discussion with the patient today about his current condition and treatment options. I recommended for him to continue on observation for now with repeat CT scan of the chest in 3 months. Regarding the gallstones and cholecystitis, he is followed by interventional radiology as well as general surgery. The patient was advised to call immediately if he has any concerning symptoms in the interval. The patient voices understanding of current disease status and treatment options and is in agreement with the current care plan. The total time spent in the appointment was 30 minutes.  All questions were answered. The patient knows to call the clinic with any problems, questions or concerns. We can certainly see the patient much sooner if necessary.  Disclaimer: This note was dictated with voice recognition software. Similar sounding words can inadvertently be transcribed and may not be corrected upon review.

## 2022-02-22 NOTE — Telephone Encounter (Signed)
Patient checked out before leaving, received updated calendar of upcoming appointments.

## 2022-03-01 ENCOUNTER — Telehealth (HOSPITAL_COMMUNITY): Payer: Self-pay | Admitting: Physician Assistant

## 2022-03-01 NOTE — Progress Notes (Signed)
Patient ID: Joe [name], male   DOB: March 07, 1946, 76 y.o.   MRN: [id number]  [name] calls this morning concerned for color of output within his biliary drain.  He states when emptying his bag today the fluid seemed darker than usual. He also states it seems there is dark blood in his drainage tubing.  He says this amount seems concerning to him.  He denies any known movement of catheter, fever/chills, abdominal pain, nausea/vomiting.   He asks if blood in tubing is normal.   Discussed that appearance of fluid can change from day to day and there is a wide range of appearances that would be considered expected.  Explained to [name] that it is difficult to assess his output by phone and offered to set up an appointment for today.  He declined.  He states he will update IR on the output later today and reassess.  He states he won't want to schedule an appointment this week, but wait until the week after.   Patient assured he can reach back out as needed.  Electronically Signed: Pasty Spillers, PA-C 03/01/2022, 8:52 AM

## 2022-03-03 ENCOUNTER — Other Ambulatory Visit: Payer: Self-pay | Admitting: Physician Assistant

## 2022-03-03 DIAGNOSIS — E039 Hypothyroidism, unspecified: Secondary | ICD-10-CM

## 2022-03-03 MED ORDER — LEVOTHYROXINE SODIUM 125 MCG PO TABS: 125.0000 ug | ORAL_TABLET | Freq: Every day | ORAL | 2 refills | Status: DC

## 2022-03-04 ENCOUNTER — Other Ambulatory Visit (HOSPITAL_COMMUNITY): Payer: Medicare Other

## 2022-03-04 DIAGNOSIS — K819 Cholecystitis, unspecified: Secondary | ICD-10-CM | POA: Diagnosis not present

## 2022-03-19 ENCOUNTER — Ambulatory Visit (HOSPITAL_COMMUNITY)
Admission: RE | Admit: 2022-03-19 | Discharge: 2022-03-19 | Disposition: A | Discharge status: Home / Self Care | Payer: Medicare Other | Source: Ambulatory Visit | Attending: Radiology | Admitting: Radiology

## 2022-03-19 ENCOUNTER — Other Ambulatory Visit: Payer: Self-pay | Admitting: Radiology

## 2022-03-19 DIAGNOSIS — K810 Acute cholecystitis: Secondary | ICD-10-CM

## 2022-03-19 DIAGNOSIS — Z434 Encounter for attention to other artificial openings of digestive tract: Secondary | ICD-10-CM | POA: Insufficient documentation

## 2022-03-19 DIAGNOSIS — K8020 Calculus of gallbladder without cholecystitis without obstruction: Secondary | ICD-10-CM | POA: Insufficient documentation

## 2022-03-19 HISTORY — PX: IR EXCHANGE BILIARY DRAIN: IMG6046

## 2022-03-19 MED ORDER — IOHEXOL 300 MG/ML  SOLN
50.0000 mL | Freq: Once | INTRAMUSCULAR | Status: AC | PRN
  Administered 2022-03-19: 10 mL

## 2022-03-19 MED ORDER — LIDOCAINE HCL 1 % IJ SOLN
INTRAMUSCULAR | Status: AC
  Filled 2022-03-19: qty 20

## 2022-03-25 ENCOUNTER — Other Ambulatory Visit: Payer: Self-pay | Admitting: Interventional Radiology

## 2022-03-25 DIAGNOSIS — K810 Acute cholecystitis: Secondary | ICD-10-CM

## 2022-03-29 ENCOUNTER — Other Ambulatory Visit (HOSPITAL_COMMUNITY): Payer: Self-pay

## 2022-04-03 ENCOUNTER — Other Ambulatory Visit (HOSPITAL_COMMUNITY): Payer: Self-pay

## 2022-04-05 ENCOUNTER — Other Ambulatory Visit: Payer: Self-pay | Admitting: Internal Medicine

## 2022-04-05 ENCOUNTER — Other Ambulatory Visit (HOSPITAL_COMMUNITY): Payer: Self-pay

## 2022-04-05 MED ORDER — NORMAL SALINE FLUSH 0.9 % IV SOLN
10.0000 mL | Freq: Every day | INTRAVENOUS | 0 refills | Status: DC
  Filled 2022-04-05: qty 560, 56d supply, fill #0

## 2022-04-06 ENCOUNTER — Encounter: Payer: Self-pay | Admitting: Internal Medicine

## 2022-04-06 ENCOUNTER — Ambulatory Visit (INDEPENDENT_AMBULATORY_CARE_PROVIDER_SITE_OTHER): Payer: Medicare Other | Admitting: Internal Medicine

## 2022-04-06 VITALS — BP 130/60 | HR 63 | Temp 98.1°F | Ht 71.0 in | Wt 175.8 lb

## 2022-04-06 DIAGNOSIS — F411 Generalized anxiety disorder: Secondary | ICD-10-CM | POA: Diagnosis not present

## 2022-04-06 DIAGNOSIS — R42 Dizziness and giddiness: Secondary | ICD-10-CM | POA: Diagnosis not present

## 2022-04-06 DIAGNOSIS — K8020 Calculus of gallbladder without cholecystitis without obstruction: Secondary | ICD-10-CM | POA: Diagnosis not present

## 2022-04-06 DIAGNOSIS — I480 Paroxysmal atrial fibrillation: Secondary | ICD-10-CM

## 2022-04-06 DIAGNOSIS — C3492 Malignant neoplasm of unspecified part of left bronchus or lung: Secondary | ICD-10-CM | POA: Diagnosis not present

## 2022-04-06 DIAGNOSIS — R269 Unspecified abnormalities of gait and mobility: Secondary | ICD-10-CM | POA: Diagnosis not present

## 2022-04-06 NOTE — Progress Notes (Signed)
Subjective:  Patient ID: Joe [name], male    DOB: 03/10/1946  Age: 77 y.o. MRN: [id number]  CC: Follow-up (2 month f/u. Had gallbladder removed.)   HPI KAYSHAWN OZBURN presents for lung cancer, COPD w/RF, GB drain  Outpatient Medications Prior to Visit  Medication Sig Dispense Refill   albuterol (PROVENTIL) (2.5 MG/3ML) 0.083% nebulizer solution Take 3 mLs (2.5 mg total) by nebulization every 6 (six) hours as needed for wheezing or shortness of breath. 75 mL 12   apixaban (ELIQUIS) 5 MG TABS tablet TAKE 1 TABLET(5 MG) BY MOUTH TWICE DAILY (Patient taking differently: Take 5 mg by mouth 2 (two) times daily.) 180 tablet 1   atenolol (TENORMIN) 25 MG tablet Take 1 tablet (25 mg total) by mouth daily as needed (palpitations). (Patient taking differently: Take 25 mg by mouth daily as needed (prolonged tachycardia).) 90 tablet 0   Cholecalciferol 25 MCG (1000 UT) tablet Take 1,000 Units by mouth daily.     clotrimazole-betamethasone (LOTRISONE) cream Apply topically 2 (two) times daily. (Patient taking differently: Apply 1 Application topically 2 (two) times daily as needed (nail fungus).) 45 g 2   diazepam (VALIUM) 5 MG tablet Take 1 tablet (5 mg total) by mouth every 12 (twelve) hours as needed for anxiety. (Patient taking differently: Take 5 mg by mouth every 12 (twelve) hours as needed for anxiety (sleep).) 180 tablet 1   levothyroxine (SYNTHROID) 125 MCG tablet Take 1 tablet (125 mcg total) by mouth daily before breakfast. 30 tablet 2   lovastatin (MEVACOR) 20 MG tablet Take 20 mg by mouth at bedtime.     Multiple Vitamin (MULTIVITAMIN) tablet Take 1 tablet by mouth daily. Centrum Silver.     Polyethyl Glycol-Propyl Glycol (SYSTANE ULTRA OP) Place 1 drop into both eyes 2 (two) times daily as needed (dry eyes).     Sodium Chloride Flush (NORMAL SALINE FLUSH) 0.9 % SOLN Flush gallbladder drain with 5 - 10 ml once daily 560 mL 0   amLODipine (NORVASC) 5 MG tablet TAKE 1 TABLET BY MOUTH  EVERY DAY (Patient not taking: Reported on 04/06/2022) 90 tablet 1   No facility-administered medications prior to visit.    ROS: Review of Systems  Constitutional:  Positive for fatigue. Negative for appetite change and unexpected weight change.  HENT:  Negative for congestion, nosebleeds, sneezing, sore throat and trouble swallowing.   Eyes:  Negative for itching and visual disturbance.  Respiratory:  Positive for shortness of breath. Negative for cough.   Cardiovascular:  Negative for chest pain, palpitations and leg swelling.  Gastrointestinal:  Negative for abdominal distention, blood in stool, diarrhea and nausea.  Genitourinary:  Negative for frequency and hematuria.  Musculoskeletal:  Positive for arthralgias. Negative for back pain, gait problem, joint swelling and neck pain.  Skin:  Negative for rash.  Neurological:  Positive for dizziness and weakness. Negative for tremors, speech difficulty and light-headedness.  Psychiatric/Behavioral:  Negative for agitation, decreased concentration, dysphoric mood and sleep disturbance. The patient is not nervous/anxious.     Objective:  BP 130/60 (BP Location: Left Arm, Patient Position: Sitting, Cuff Size: Large)   Pulse 63   Temp 98.1 F (36.7 C) (Oral)   Ht 5\' 11"  (1.803 m)   Wt 175 lb 12.8 oz (79.7 kg)   SpO2 97%   BMI 24.52 kg/m   BP Readings from Last 3 Encounters:  04/06/22 130/60  02/22/22 137/73  02/01/22 110/62    Wt Readings from Last 3 Encounters:  04/06/22  175 lb 12.8 oz (79.7 kg)  02/22/22 171 lb 9 oz (77.8 kg)  02/01/22 165 lb (74.8 kg)    Physical Exam Constitutional:      General: He is not in acute distress.    Appearance: He is well-developed.     Comments: NAD  Eyes:     Conjunctiva/sclera: Conjunctivae normal.     Pupils: Pupils are equal, round, and reactive to light.  Neck:     Thyroid: No thyromegaly.     Vascular: No JVD.  Cardiovascular:     Rate and Rhythm: Normal rate and regular  rhythm.     Heart sounds: Normal heart sounds. No murmur heard.    No friction rub. No gallop.  Pulmonary:     Effort: Pulmonary effort is normal. No respiratory distress.     Breath sounds: Normal breath sounds. No wheezing or rales.  Chest:     Chest wall: No tenderness.  Abdominal:     General: Bowel sounds are normal. There is no distension.     Palpations: Abdomen is soft. There is no mass.     Tenderness: There is no abdominal tenderness. There is no guarding or rebound.  Musculoskeletal:        General: No tenderness. Normal range of motion.     Cervical back: Normal range of motion.     Right lower leg: No edema.     Left lower leg: No edema.  Lymphadenopathy:     Cervical: No cervical adenopathy.  Skin:    General: Skin is warm and dry.     Findings: No rash.  Neurological:     Mental Status: He is alert and oriented to person, place, and time.     Cranial Nerves: No cranial nerve deficit.     Motor: No abnormal muscle tone.     Coordination: Coordination normal.     Gait: Gait normal.     Deep Tendon Reflexes: Reflexes are normal and symmetric.  Psychiatric:        Behavior: Behavior normal.        Thought Content: Thought content normal.        Judgment: Judgment normal.   GB drain On O2 Using a cane Dizzy w/motion  Lab Results  Component Value Date   WBC 10.2 02/01/2022   HGB 13.6 02/01/2022   HCT 43.1 02/01/2022   PLT 330.0 02/01/2022   GLUCOSE 86 02/01/2022   CHOL 153 11/23/2018   TRIG 201.0 (H) 11/23/2018   HDL 38.20 (L) 11/23/2018   LDLDIRECT 99.0 11/23/2018   LDLCALC 80 11/07/2017   ALT 21 02/01/2022   AST 23 02/01/2022   NA 140 02/01/2022   K 4.0 02/01/2022   CL 101 02/01/2022   CREATININE 0.68 02/01/2022   BUN 6 02/01/2022   CO2 34 (H) 02/01/2022   TSH 9.336 (H) 02/22/2022   PSA 0.06 (L) 07/22/2021   INR 1.3 (H) 01/20/2022    IR EXCHANGE BILIARY DRAIN  Result Date: 03/19/2022 INDICATION: History of acute cholecystitis, post  ultrasound fluoroscopic guided cholecystostomy tube placement on 01/21/2022, exchange and upsized on 02/02/2022. EXAM: FLUOROSCOPIC GUIDED CHOLECYSTOSTOMY TUBE EXCHANGE COMPARISON:  02/02/2022, 01/21/2022 MEDICATIONS: None ANESTHESIA/SEDATION: None CONTRAST:  34mL OMNIPAQUE IOHEXOL 300 MG/ML SOLN - administered into the gallbladder lumen. FLUOROSCOPY TIME:  Eighteen mGy COMPLICATIONS: None immediate. PROCEDURE: The patient was positioned supine on the fluoroscopy table. The external portion of the existing cholecystostomy tube as well as the surrounding skin was prepped and draped in usual sterile  fashion. A time-out was performed prior to the initiation of the procedure. A preprocedural spot fluoroscopic image was obtained of the right upper abdominal quadrant existing cholecystostomy tube. The skin surrounding the cholecystostomy tube was anesthetized with 1% lidocaine with epinephrine. The external portion of the cholecystostomy tube was cut and cannulated with a short Amplatz wire which was advanced through the tube and coiled within the gallbladder lumen Next, under intermittent fluoroscopic guidance, the existing 12 French cholecystostomy tube was exchanged for a new 12 [place] cholecystostomy tube which was repositioned into the more central aspect of the gallbladder lumen. Contrast injection confirms appropriate positioning and functionality of the cholecystomy tube. The cholecystostomy tube was flushed with a small amount of saline and reconnected to a gravity bag. The cholecystostomy tube was secured with an interrupted suture and a Stat Lock device. A dressing was applied. The patient tolerated the procedure well without immediate postprocedural complication. IMPRESSION: Successful, routine fluoroscopic guided exchange of 12 French cholecystostomy tube. PLAN: - The patient's cholecystostomy tube was reconnected to a gravity bag. - The patient may return to the interventional radiology drain clinic in 8  weeks for repeat diagnostic cholangiogram. -the patient was recently deemed not a surgical candidate and is therefore interested in pursuing percutaneous gallstone retrieval in hopes of eventual drain removal. Clinic appointment will be arranged in short order. The patient knows to call the interventional radiology drain clinic [phone]) with any interval questions or concerns. Marliss Coots, MD [facility] Electronically Signed   By: Marliss Coots M.D.   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 Dysert has multiple medical issues, and severe cholecystitis which appears to be responding well to percutaneous drainage. We discussed surgery for cholecystectomy versus observation after drain removal. I am hopeful the gallbladder has scarred down to nothing and the drain will eventually be able to be removed without any recurrent symptoms or problems. I feel this is the lowest risk for the patient and will enable him to spend his time doing the things he wants to be doing instead of recovering from surgery. The severity of the inflammation on CT, put him at increased risk for a postoperative complication. I do not feel he would tolerate a complication well. He has follow-up scheduled with interventional radiology and will call our office once the drain has been reevaluated." PAUL JEFFREY STECHSCHULTE, MD         Other   Vertigo    Chronic Move carefully B-D exercise      Generalized anxiety disorder    Chronic   Diazepam prn  Potential benefits of a long term benzodiazepines  use as well as potential risks  and complications were explained to the patient and were aknowledged.      Gait disorder    Using a cane         No orders of the defined  types were placed in this encounter.     Follow-up: Return in about 3 months (around 07/06/2022) for a follow-up visit.  Sonda Primes, MD

## 2022-04-06 NOTE — Assessment & Plan Note (Signed)
Using a cane

## 2022-04-06 NOTE — Assessment & Plan Note (Signed)
Per Dr STECHSCHULTE: "[name] has multiple medical issues, and severe cholecystitis which appears to be responding well to percutaneous drainage. We discussed surgery for cholecystectomy versus observation after drain removal. I am hopeful the gallbladder has scarred down to nothing and the drain will eventually be able to be removed without any recurrent symptoms or problems. I feel this is the lowest risk for the patient and will enable him to spend his time doing the things he wants to be doing instead of recovering from surgery. The severity of the inflammation on CT, put him at increased risk for a postoperative complication. I do not feel he would tolerate a complication well. He has follow-up scheduled with interventional radiology and will call our office once the drain has been reevaluated." PAUL JEFFREY STECHSCHULTE, MD

## 2022-04-06 NOTE — Assessment & Plan Note (Signed)
F/u w/Dr Arbutus Ped, Dr Roselind Messier, Dr Thora Lance

## 2022-04-06 NOTE — Assessment & Plan Note (Signed)
On Eliquis

## 2022-04-06 NOTE — Assessment & Plan Note (Signed)
Chronic Move carefully B-D exercise

## 2022-04-06 NOTE — Assessment & Plan Note (Signed)
Chronic Diazepam prn  Potential benefits of a long term benzodiazepines  use as well as potential risks  and complications were explained to the patient and were aknowledged. 

## 2022-04-08 ENCOUNTER — Ambulatory Visit
Admission: RE | Admit: 2022-04-08 | Discharge: 2022-04-08 | Disposition: A | Discharge status: Home / Self Care | Payer: Medicare Other | Source: Ambulatory Visit | Attending: Interventional Radiology | Admitting: Interventional Radiology

## 2022-04-08 DIAGNOSIS — K810 Acute cholecystitis: Secondary | ICD-10-CM

## 2022-04-08 DIAGNOSIS — K8000 Calculus of gallbladder with acute cholecystitis without obstruction: Secondary | ICD-10-CM | POA: Diagnosis not present

## 2022-04-08 HISTORY — PX: IR RADIOLOGIST EVAL & MGMT: IMG5224

## 2022-04-08 NOTE — Consult Note (Signed)
Chief Complaint: Patient was seen in consultation today for cholelithiasis status post cholecystostomy tube placement  History of Present Illness: Joe [name] is a 77 y.o. male with history of non-small cell lung cancer who underwent surveillance PET-CT on 01/15/22 which demonstrated concern for acute calculous cholecystitis.  Further evaluation with ultrasound and MR revealed acute perforated calculous cholecystitis.   He has been deemed a non-operative candidate due to comorbidities including age, COPD.   He has tolerated the cholecystomy OK, but has had multiple exchanges  due to leakage.  No current leakage, but does have some intermittent blood in the drainage bag.  He drains about 30-40 mL per day.  No upper abdominal pain, fevers, chills, nausea, vomiting, or significant change in bowel habits.    The most recent drain exchange was on 03/19/22 (12 Fr).    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    Past Surgical History:  Procedure Laterality Date   BICEPS TENDON REPAIR     BRONCHIAL BIOPSY  10/23/2019   Procedure: BRONCHIAL BIOPSIES;  Surgeon: Leslye Peer, MD;  Location: [facility] ENDOSCOPY;  Service: Pulmonary;;   BRONCHIAL BIOPSY  11/20/2019   Procedure: BRONCHIAL BIOPSIES;  Surgeon: Leslye Peer, MD;  Location: [facility] ENDOSCOPY;  Service: Pulmonary;;   BRONCHIAL BRUSHINGS  10/23/2019   Procedure: BRONCHIAL BRUSHINGS;  Surgeon: Leslye Peer, MD;  Location: [facility] ENDOSCOPY;  Service: Pulmonary;;   BRONCHIAL BRUSHINGS  11/20/2019   Procedure: BRONCHIAL BRUSHINGS;  Surgeon: Leslye Peer, MD;  Location: [facility] ENDOSCOPY;  Service: Pulmonary;;   BRONCHIAL NEEDLE ASPIRATION BIOPSY  10/23/2019   Procedure: BRONCHIAL NEEDLE ASPIRATION BIOPSIES;  Surgeon: Leslye Peer, MD;  Location: [facility] ENDOSCOPY;  Service: Pulmonary;;   BRONCHIAL NEEDLE ASPIRATION BIOPSY  11/20/2019  Procedure: BRONCHIAL NEEDLE ASPIRATION BIOPSIES;  Surgeon: Leslye Peer, MD;  [facility] ENDOSCOPY;  Service: Pulmonary;;   BRONCHIAL WASHINGS  11/20/2019   Procedure: BRONCHIAL WASHINGS;  Surgeon: Leslye Peer, MD;  Location: [facility] ENDOSCOPY;  Service: Pulmonary;;   CATARACT EXTRACTION Left    COLONOSCOPY  04-14-99   Dr Herma Ard polyp-TA in epic   IR EXCHANGE BILIARY DRAIN  02/02/2022   IR EXCHANGE BILIARY DRAIN  03/19/2022   IR PERC CHOLECYSTOSTOMY  01/21/2022   POLYPECTOMY  04-14-99   POSTERIOR LAMINECTOMY / DECOMPRESSION CERVICAL SPINE     Dr Wynetta Emery   RETINAL DETACHMENT SURGERY     left eye, 2007 x2, 2008 x 3   ROTATOR CUFF REPAIR  2004   right   TONSILLECTOMY   0793,[id number]   VIDEO BRONCHOSCOPY WITH ENDOBRONCHIAL NAVIGATION N/A 10/23/2019   Procedure: VIDEO BRONCHOSCOPY WITH ENDOBRONCHIAL NAVIGATION;  Surgeon: Leslye Peer, MD;  Location: [facility] ENDOSCOPY;  Service: Pulmonary;  Laterality: N/A;   VIDEO BRONCHOSCOPY WITH ENDOBRONCHIAL NAVIGATION N/A 11/20/2019   Procedure: VIDEO BRONCHOSCOPY WITH ENDOBRONCHIAL NAVIGATION;  Surgeon: Leslye Peer, MD;  Location: [facility] ENDOSCOPY;  Service: Pulmonary;  Laterality: N/A;   VIDEO BRONCHOSCOPY WITH ENDOBRONCHIAL ULTRASOUND N/A 10/23/2019   Procedure: VIDEO BRONCHOSCOPY WITH ENDOBRONCHIAL ULTRASOUND;  Surgeon: Leslye Peer, MD;  Location: [facility] ENDOSCOPY;  Service: Pulmonary;  Laterality: N/A;    Allergies: Patient has no known allergies.  Medications: Prior to Admission medications   Medication Sig Start Date End Date Taking? Authorizing Provider  albuterol (PROVENTIL) (2.5 MG/3ML) 0.083% nebulizer solution Take 3 mLs (2.5 mg total) by nebulization every 6 (six) hours as needed for wheezing or shortness of breath. 03/04/21   Omar Person, MD  amLODipine (NORVASC) 5 MG tablet TAKE 1 TABLET BY MOUTH EVERY DAY Patient not taking: Reported on 04/06/2022 11/09/21   [name], [name], MD  apixaban (ELIQUIS) 5 MG TABS tablet TAKE 1 TABLET(5 MG) BY MOUTH TWICE DAILY Patient taking differently: Take 5 mg by mouth 2 (two) times daily. 09/25/21   Quintella Reichert, MD  atenolol (TENORMIN) 25 MG tablet Take 1 tablet (25 mg total) by mouth daily as needed (palpitations). Patient taking differently: Take 25 mg by mouth daily as needed (prolonged tachycardia). 07/29/21   [name], [name], MD  Cholecalciferol 25 MCG (1000 UT) tablet Take 1,000 Units by mouth daily.    [provider]  clotrimazole-betamethasone (LOTRISONE) cream Apply topically 2 (two) times daily. Patient taking differently: Apply 1 Application topically 2 (two) times daily as needed (nail fungus). 12/24/20   [name], [name], MD  diazepam  (VALIUM) 5 MG tablet Take 1 tablet (5 mg total) by mouth every 12 (twelve) hours as needed for anxiety. Patient taking differently: Take 5 mg by mouth every 12 (twelve) hours as needed for anxiety (sleep). 07/29/21   [name], [name], MD  levothyroxine (SYNTHROID) 125 MCG tablet Take 1 tablet (125 mcg total) by mouth daily before breakfast. 03/03/22   Heilingoetter, Cassandra L, PA-C  lovastatin (MEVACOR) 20 MG tablet Take 20 mg by mouth at bedtime. 02/08/22   [provider]  Multiple Vitamin (MULTIVITAMIN) tablet Take 1 tablet by mouth daily. Centrum Silver.    [provider]  Polyethyl Glycol-Propyl Glycol (SYSTANE ULTRA OP) Place 1 drop into both eyes 2 (two) times daily as needed (dry eyes).    [provider]  Sodium Chloride Flush (NORMAL SALINE FLUSH) 0.9 % SOLN Flush gallbladder drain with 5 - 10 ml once daily 04/05/22  Shon Hough, NP     Family History  Problem Relation Age of Onset   COPD Mother    Diabetes Father    Coronary artery disease Other    Breast cancer Paternal Aunt 64   Colon cancer Neg Hx     Social History   Socioeconomic History   Marital status: Widowed    Spouse name: Not on file   Number of children: 1   Years of education: Not on file   Highest education level: Not on file  Occupational History   Occupation: retired  Tobacco Use   Smoking status: Former    Packs/day: 0.80    Years: 50.00    Total pack years: 40.00    Types: Cigarettes    Quit date: 08/27/2012    Years since quitting: 9.6   Smokeless tobacco: Never  Vaping Use   Vaping Use: Never used  Substance and Sexual Activity   Alcohol use: Not Currently   Drug use: No   Sexual activity: Not Currently  Other Topics Concern   Not on file  Social History Narrative   Not on file   Social Determinants of Health   Financial Resource Strain: Low Risk  (09/23/2021)   Overall Financial Resource Strain (CARDIA)    Difficulty of Paying Living Expenses: Not  hard at all  Food Insecurity: No Food Insecurity (01/20/2022)   Hunger Vital Sign    Worried About Running Out of Food in the Last Year: Never true    Ran Out of Food in the Last Year: Never true  Transportation Needs: No Transportation Needs (01/20/2022)   PRAPARE - Administrator, Civil Service (Medical): No    Lack of Transportation (Non-Medical): No  Physical Activity: Insufficiently Active (09/23/2021)   Exercise Vital Sign    Days of Exercise per Week: 4 days    Minutes of Exercise per Session: 30 min  Stress: No Stress Concern Present (09/23/2021)   [employer] of Occupational Health - Occupational Stress Questionnaire    Feeling of Stress : Not at all  Social Connections: Moderately Integrated (09/23/2021)   Social Connection and Isolation Panel [NHANES]    Frequency of Communication with Friends and Family: More than three times a week    Frequency of Social Gatherings with Friends and Family: Three times a week    Attends Religious Services: 1 to 4 times per year    Active Member of Clubs or Organizations: Yes    Attends Banker Meetings: More than 4 times per year    Marital Status: Widowed    Review of Systems: A 12 point ROS discussed and pertinent positives are indicated in the HPI above.  All other systems are negative.  Vital Signs: There were no vitals taken for this visit.   Physical Exam Constitutional:      General: He is not in acute distress. HENT:     Head: Normocephalic.     Nose:     Comments: Nasal cannula in place.    Mouth/Throat:     Mouth: Mucous membranes are moist.  Eyes:     General: No scleral icterus. Cardiovascular:     Rate and Rhythm: Normal rate and regular rhythm.     Pulses: Normal pulses.  Pulmonary:     Effort: No respiratory distress.  Abdominal:     General: There is no distension.     Comments: RUQ cholecystostomy tube in place with lightly blood tinged opaque fluid in  bag.  Musculoskeletal:         General: No swelling.  Skin:    General: Skin is warm and dry.     Coloration: Skin is not jaundiced.  Neurological:     Mental Status: He is alert and oriented to person, place, and time.     Imaging: Drain exchange 03/19/22   Labs:  CBC: Recent Labs    01/22/22 0528 01/23/22 0705 01/24/22 0542 02/01/22 1043  WBC 13.0* 16.1* 11.7* 10.2  HGB 11.3* 11.2* 11.7* 13.6  HCT 36.9* 37.4* 38.3* 43.1  PLT 340 322 225 330.0    COAGS: Recent Labs    01/20/22 2127  INR 1.3*    BMP: Recent Labs    01/21/22 0723 01/22/22 0528 01/23/22 0705 01/24/22 0542 02/01/22 1043  NA 142 138 135 136 140  K 4.6 3.9 3.6 3.7 4.0  CL 106 103 99 99 101  CO2 31 29 31 31  34*  GLUCOSE 99 119* 116* 102* 86  BUN 8 9 8 8 6   CALCIUM 8.8* 8.0* 8.2* 8.3* 9.5  CREATININE 0.78 0.81 0.72 0.43* 0.68  GFRNONAA >60 >60 >60 >60  --     LIVER FUNCTION TESTS: Recent Labs    01/21/22 0723 01/22/22 0528 01/23/22 0705 02/01/22 1043  BILITOT 1.5* 1.5* 1.2 1.0  AST 41 33 27 23  ALT 54* 44 38 21  ALKPHOS 130* 106 90 80  PROT 6.1* 5.8* 5.9* 7.4  ALBUMIN 2.4* 2.3* 2.3* 3.6    TUMOR MARKERS: No results for input(s): "AFPTM", "CEA", "CA199", "CHROMGRNA" in the last 8760 hours.  Assessment and Plan: 77 year old male with history of acute calculous cholecystitis status post percutaneous cholecystostomy tube placement on 01/21/22.  He is not an operative candidate for consideration of cholecystectomy.  He is interested in pursuing choledochoscopic guided percutaneous gallstone retrieval in hopes of eventual tube removal.  We discussed the periprocedural expectations, particularly that stone removal may take multiple sessions and it will be at least a month after the final session before the drain can be removed, if at all possible.  He is in agreement and ready to proceed, as long as the procedure will be covered by Medicare.    -submit percutaneous gallstone retrieval for approval -if approved, plan for  choledochoscopic guided percutaneous gallstone retrieval at [facility] -he will need to hold Eliquis for 4 doses prior to procedure   Electronically Signed: 13/2/23, MD 04/08/2022, 12:54 PM   I spent a total of  40 Minutes  in face to face in clinical consultation, greater than 50% of which was counseling/coordinating care for gallstones.

## 2022-04-15 ENCOUNTER — Telehealth: Payer: Self-pay | Admitting: *Deleted

## 2022-04-15 ENCOUNTER — Other Ambulatory Visit (HOSPITAL_COMMUNITY): Payer: Self-pay | Admitting: Interventional Radiology

## 2022-04-15 DIAGNOSIS — K8000 Calculus of gallbladder with acute cholecystitis without obstruction: Secondary | ICD-10-CM

## 2022-04-15 NOTE — Telephone Encounter (Signed)
   Pre-operative Risk Assessment    Patient Name: Joe [name]  DOB: 10/06/1945 MRN: [id number]      Request for Surgical Clearance    Procedure:   Choledochoscopic guided precutaneous gallstone retrieval  Date of Surgery:  Clearance 05/04/22                                 Surgeon:  Dr. Ruthann Cancer Surgeon's Group or Practice Name:  [facility] Radiology Department Phone number:  [phone] Fax number:  [phone]   Type of Clearance Requested:   - Pharmacy:  Hold Apixaban (Eliquis) 4 days   Type of Anesthesia:   Moderate sedation   Additional requests/questions:    Signed, Greer Ee   04/15/2022, 3:39 PM

## 2022-04-16 ENCOUNTER — Telehealth: Payer: Self-pay | Admitting: *Deleted

## 2022-04-16 NOTE — Telephone Encounter (Signed)
S/w the pt and he is agreeable to plan of care for tele pre op appt 04/23/22 @ 10 am. Med rec and consent are done.

## 2022-04-16 NOTE — Telephone Encounter (Signed)
Patient with diagnosis of afib on Eliquis for anticoagulation.    Procedure: Choledochoscopic guided precutaneous gallstone retrieval  Date of procedure: 05/04/22  CHA2DS2-VASc Score = 4  This indicates a 4.8% annual risk of stroke. The patient's score is based upon: CHF History: 0 HTN History: 1 Diabetes History: 0 Stroke History: 0 Vascular Disease History: 1 Age Score: 2 Gender Score: 0   CrCl 60mL/min Platelet count 330K  4 day hold is a bit long. Per office protocol, patient can hold Eliquis for 2-3 days prior to procedure.    **This guidance is not considered finalized until pre-operative APP has relayed final recommendations.**

## 2022-04-16 NOTE — Telephone Encounter (Signed)
Left message to call back to set up tele pre op appt.

## 2022-04-16 NOTE — Telephone Encounter (Signed)
S/w the pt and he is agreeable to plan of care for tele pre op appt 04/23/22 @ 10 am. Med rec and consent are done.     Patient Consent for Virtual Visit        Joe [name] has provided verbal consent on 04/16/2022 for a virtual visit (video or telephone).   CONSENT FOR VIRTUAL VISIT FOR:  Joe [name]  By participating in this virtual visit I agree to the following:  I hereby voluntarily request, consent and authorize [name] and its employed or contracted physicians, physician assistants, nurse practitioners or other licensed health care professionals (the Practitioner), to provide me with telemedicine health care services (the "Services") as deemed necessary by the treating Practitioner. I acknowledge and consent to receive the Services by the Practitioner via telemedicine. I understand that the telemedicine visit will involve communicating with the Practitioner through live audiovisual communication technology and the disclosure of certain medical information by electronic transmission. I acknowledge that I have been given the opportunity to request an in-person assessment or other available alternative prior to the telemedicine visit and am voluntarily participating in the telemedicine visit.  I understand that I have the right to withhold or withdraw my consent to the use of telemedicine in the course of my care at any time, without affecting my right to future care or treatment, and that the Practitioner or I may terminate the telemedicine visit at any time. I understand that I have the right to inspect all information obtained and/or recorded in the course of the telemedicine visit and may receive copies of available information for a reasonable fee.  I understand that some of the potential risks of receiving the Services via telemedicine include:  Delay or interruption in medical evaluation due to technological equipment failure or disruption; Information transmitted  may not be sufficient (e.g. poor resolution of images) to allow for appropriate medical decision making by the Practitioner; and/or  In rare instances, security protocols could fail, causing a breach of personal health information.  Furthermore, I acknowledge that it is my responsibility to provide information about my medical history, conditions and care that is complete and accurate to the best of my ability. I acknowledge that Practitioner's advice, recommendations, and/or decision may be based on factors not within their control, such as incomplete or inaccurate data provided by me or distortions of diagnostic images or specimens that may result from electronic transmissions. I understand that the practice of medicine is not an exact science and that Practitioner makes no warranties or guarantees regarding treatment outcomes. I acknowledge that a copy of this consent can be made available to me via my patient portal ([name]), or I can request a printed copy by calling the office of [name].    I understand that my insurance will be billed for this visit.   I have read or had this consent read to me. I understand the contents of this consent, which adequately explains the benefits and risks of the Services being provided via telemedicine.  I have been provided ample opportunity to ask questions regarding this consent and the Services and have had my questions answered to my satisfaction. I give my informed consent for the services to be provided through the use of telemedicine in my medical care

## 2022-04-16 NOTE — Telephone Encounter (Signed)
   Name: Joe [name]  DOB: December 07, 1945  MRN: [id number]  Primary Cardiologist: None  Chart reviewed as part of pre-operative protocol coverage. Because of [name] past medical history and time since last visit, he will require a follow-up telephone visit in order to better assess preoperative cardiovascular risk.  Pre-op covering staff: - Please schedule appointment and call patient to inform them. If patient already had an upcoming appointment within acceptable timeframe, please add "pre-op clearance" to the appointment notes so provider is aware. - Please contact requesting surgeon's office via preferred method (i.e, phone, fax) to inform them of need for appointment prior to surgery.  Per office protocol, patient can hold Eliquis for 2-3 days prior to procedure.      Elgie Collard, PA-C  04/16/2022, 1:37 PM

## 2022-04-16 NOTE — Telephone Encounter (Signed)
Patient returning call.

## 2022-04-23 ENCOUNTER — Telehealth: Payer: Self-pay | Admitting: Internal Medicine

## 2022-04-23 ENCOUNTER — Ambulatory Visit: Payer: Medicare Other | Attending: Cardiovascular Disease | Admitting: Physician Assistant

## 2022-04-23 DIAGNOSIS — Z01810 Encounter for preprocedural cardiovascular examination: Secondary | ICD-10-CM | POA: Diagnosis not present

## 2022-04-23 NOTE — Telephone Encounter (Signed)
Called patient regarding upcoming February and March appointments. Patient is notified.

## 2022-04-23 NOTE — Telephone Encounter (Signed)
See today's virtual telephone visit note for cardiac clearance.  I have forwarded my cardiac clearance note to the surgeon's office.

## 2022-04-23 NOTE — Progress Notes (Signed)
Virtual Visit via Telephone Note   Because of Joe [name]'s co-morbid illnesses, he is at least at moderate risk for complications without adequate follow up.  This format is felt to be most appropriate for this patient at this time.  The patient did not have access to video technology/had technical difficulties with video requiring transitioning to audio format only (telephone).  All issues noted in this document were discussed and addressed.  No physical exam could be performed with this format.  Please refer to the patient's chart for his consent to telehealth for [facility].  Evaluation Performed:  Preoperative cardiovascular risk assessment _____________   Date:  04/23/2022   Patient ID:  Joe [name] 29-Jun-1945, MRN [id number] Patient Location:  Home Provider location:   Office  Primary Care Provider:  Cassandria Anger, MD Primary Cardiologist:  None  Chief Complaint / Patient Profile   77 y.o. y/o male with a h/o COPD on 2L [place], stage III lung CA, PAF, mild to moderate AI, coronary artery calcification with negative Myoview 2022, hypertension and obstructive sleep apnea on CPAP who is pending choledochoscopic guided percutaneous gallstone retrieval by radiology Dr. Serafina Royals and presents today for telephonic preoperative cardiovascular risk assessment.  History of Present Illness    [name] is a 77 y.o. male who presents via audio/video conferencing for a telehealth visit today.  Pt was last seen in cardiology clinic on 07/22/2021 by Dr. Radford [facility].  At that time RASHAAD HALLSTROM was doing well.  The patient is now pending procedure as outlined above. Since his last visit, he has been doing well from a cardiac perspective.  He denies any exertional chest pain or worsening dyspnea.  His functional ability is mainly limited by pulmonary issue rather than cardiac issue.  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   Past Surgical History:  Procedure Laterality Date   BICEPS TENDON REPAIR     BRONCHIAL BIOPSY  10/23/2019   Procedure: BRONCHIAL BIOPSIES;  Surgeon: [name], MD;  Location[facility] ENDOSCOPY;  Service: Pulmonary;;   BRONCHIAL BIOPSY  11/20/2019   Procedure: BRONCHIAL BIOPSIES;  Surgeon: [name], MD;  Location: [facility] ENDOSCOPY;  Service: Pulmonary;;   BRONCHIAL BRUSHINGS  10/23/2019   Procedure: BRONCHIAL BRUSHINGS;  Surgeon: [name], MD;  Location: [facility] ENDOSCOPY;  Service: Pulmonary;;   BRONCHIAL BRUSHINGS  11/20/2019   Procedure: BRONCHIAL BRUSHINGS;   Surgeon: [name], MD;  Location: [facility] ENDOSCOPY;  Service: Pulmonary;;   BRONCHIAL NEEDLE ASPIRATION BIOPSY  10/23/2019   Procedure: BRONCHIAL NEEDLE ASPIRATION BIOPSIES;  Surgeon: [name], MD;  Location: [facility] ENDOSCOPY;  Service: Pulmonary;;   BRONCHIAL NEEDLE ASPIRATION BIOPSY  11/20/2019   Procedure: BRONCHIAL NEEDLE ASPIRATION BIOPSIES;  Surgeon: [name], MD;  Location: [facility] ENDOSCOPY;  Service: Pulmonary;;   BRONCHIAL WASHINGS  11/20/2019   Procedure: BRONCHIAL WASHINGS;  Surgeon: [name], MD;  Location: [facility];  Service: Pulmonary;;   CATARACT EXTRACTION Left    COLONOSCOPY  04-14-99   Dr Flossie Dibble polyp-TA in epic   IR [place]  02/02/2022   IR EXCHANGE BILIARY [place]  03/19/2022   IR PERC CHOLECYSTOSTOMY  01/21/2022   IR RADIOLOGIST EVAL & MGMT  04/08/2022   POLYPECTOMY  04-14-99   POSTERIOR LAMINECTOMY / DECOMPRESSION CERVICAL SPINE     Dr Saintclair Halsted   RETINAL DETACHMENT SURGERY     left eye, 2007 x2, 2008 x 3   ROTATOR CUFF REPAIR  2004   right   TONSILLECTOMY  [phone]   VIDEO BRONCHOSCOPY WITH ENDOBRONCHIAL NAVIGATION N/A 10/23/2019   Procedure: VIDEO BRONCHOSCOPY WITH ENDOBRONCHIAL NAVIGATION;  Surgeon: [name], MD;  Location: [facility] ENDOSCOPY;  Service: Pulmonary;  Laterality: N/A;   VIDEO BRONCHOSCOPY WITH ENDOBRONCHIAL NAVIGATION N/A 11/20/2019   Procedure: VIDEO BRONCHOSCOPY WITH ENDOBRONCHIAL NAVIGATION;  Surgeon: [name], MD;  Location: [facility] ENDOSCOPY;  Service: Pulmonary;  Laterality: N/A;   VIDEO BRONCHOSCOPY WITH ENDOBRONCHIAL ULTRASOUND N/A 10/23/2019   Procedure: VIDEO BRONCHOSCOPY WITH ENDOBRONCHIAL ULTRASOUND;  Surgeon: [name], MD;  Location: [facility] ENDOSCOPY;  Service: Pulmonary;  Laterality: N/A;    Allergies  No Known Allergies  Home Medications    Prior to Admission medications   Medication Sig Start Date End Date Taking? Authorizing Provider  albuterol (PROVENTIL) (2.5 MG/3ML) 0.083% nebulizer solution Take  3 mLs (2.5 mg total) by nebulization every 6 (six) hours as needed for wheezing or shortness of breath. 03/04/21   Maryjane Hurter, MD  amLODipine (NORVASC) 5 MG tablet TAKE 1 TABLET BY MOUTH EVERY DAY Patient not taking: Reported on 04/06/2022 11/09/21   [name], [name], MD  apixaban (ELIQUIS) 5 MG TABS tablet TAKE 1 TABLET(5 MG) BY MOUTH TWICE DAILY Patient taking differently: Take 5 mg by mouth 2 (two) times daily. 09/25/21   Sueanne Margarita, MD  atenolol (TENORMIN) 25 MG tablet Take 1 tablet (25 mg total) by mouth daily as needed (palpitations). Patient not taking: Reported on 04/16/2022 07/29/21   [name], [name], MD  Cholecalciferol 25 MCG (1000 UT) tablet Take 1,000 Units by mouth daily.    [provider]  clotrimazole-betamethasone (LOTRISONE) cream Apply topically 2 (two) times daily. Patient not taking: Reported on  04/16/2022 12/24/20   [name], [name], MD  diazepam (VALIUM) 5 MG tablet Take 1 tablet (5 mg total) by mouth every 12 (twelve) hours as needed for anxiety. Patient taking differently: Take 5 mg by mouth every 12 (twelve) hours as needed for anxiety (sleep). 07/29/21   [name], [name], MD  levothyroxine (SYNTHROID) 125 MCG tablet Take 1 tablet (125 mcg total) by mouth daily before breakfast. 03/03/22   Heilingoetter, Cassandra L, PA-C  lovastatin (MEVACOR) 20 MG tablet Take 20 mg by mouth at bedtime. 02/08/22   [provider]  Multiple Vitamin (MULTIVITAMIN) tablet Take 1 tablet by mouth daily. Centrum Silver.    [provider]  Polyethyl Glycol-Propyl Glycol (SYSTANE ULTRA OP) Place 1 drop into both eyes 2 (two) times daily as needed (dry eyes).    [provider]  Sodium Chloride Flush (NORMAL SALINE FLUSH) 0.9 % SOLN Flush gallbladder drain with 5 - 10 ml once daily 04/05/22   Tyson Alias, NP    Physical Exam    Vital Signs:  HAILEY MILES does not have vital signs available for review today.  Given telephonic  nature of communication, physical exam is limited. AAOx3. NAD. Normal affect.  Speech and respirations are unlabored.  Accessory Clinical Findings    None  Assessment & Plan    1.  Preoperative Cardiovascular Risk Assessment:  -Mr. Easler is a pleasant 77 year old male with past medical history of stage III lung cancer, PAF, COPD on 2 L home O2, coronary artery calcification and obstructive sleep apnea.  Since the last visit, he has been doing well without exertional chest pain or worsening dyspnea.  Given his O2 requirement, he will be at least a moderate risk patient going through any procedure, however the upcoming procedure is considered fairly low risk.  He is at acceptable risk to proceed from the cardiac perspective.  RCRI perioperative risk is 0.9%, class II risk of major cardiac event.  He may hold Eliquis for 2 to 3 days prior to the procedure and restart as soon as possible afterward at the surgeon's discretion.  The patient was advised that if he develops new symptoms prior to surgery to contact our office to arrange for a follow-up visit, and he verbalized understanding.   A copy of this note will be routed to requesting surgeon.  Time:   Today, I have spent 16 minutes with the patient with telehealth technology discussing medical history, symptoms, and management plan.     [place], [place]  04/23/2022, 9:55 AM

## 2022-04-29 ENCOUNTER — Other Ambulatory Visit: Payer: Self-pay | Admitting: Radiology

## 2022-04-29 DIAGNOSIS — K8010 Calculus of gallbladder with chronic cholecystitis without obstruction: Secondary | ICD-10-CM

## 2022-05-03 ENCOUNTER — Other Ambulatory Visit: Payer: Self-pay | Admitting: Internal Medicine

## 2022-05-04 ENCOUNTER — Other Ambulatory Visit: Payer: Self-pay

## 2022-05-04 ENCOUNTER — Other Ambulatory Visit: Payer: Self-pay | Admitting: Radiology

## 2022-05-04 ENCOUNTER — Ambulatory Visit (HOSPITAL_COMMUNITY)
Admission: RE | Admit: 2022-05-04 | Discharge: 2022-05-04 | Disposition: A | Discharge status: Home / Self Care | Payer: Medicare Other | Source: Ambulatory Visit | Attending: Interventional Radiology | Admitting: Interventional Radiology

## 2022-05-04 ENCOUNTER — Other Ambulatory Visit (HOSPITAL_COMMUNITY): Payer: Self-pay | Admitting: Interventional Radiology

## 2022-05-04 ENCOUNTER — Encounter (HOSPITAL_COMMUNITY): Payer: Self-pay

## 2022-05-04 DIAGNOSIS — Z85118 Personal history of other malignant neoplasm of bronchus and lung: Secondary | ICD-10-CM | POA: Diagnosis not present

## 2022-05-04 DIAGNOSIS — K8001 Calculus of gallbladder with acute cholecystitis with obstruction: Secondary | ICD-10-CM | POA: Diagnosis not present

## 2022-05-04 DIAGNOSIS — K8000 Calculus of gallbladder with acute cholecystitis without obstruction: Secondary | ICD-10-CM

## 2022-05-04 DIAGNOSIS — K8010 Calculus of gallbladder with chronic cholecystitis without obstruction: Secondary | ICD-10-CM

## 2022-05-04 DIAGNOSIS — Z7901 Long term (current) use of anticoagulants: Secondary | ICD-10-CM | POA: Insufficient documentation

## 2022-05-04 DIAGNOSIS — Z87891 Personal history of nicotine dependence: Secondary | ICD-10-CM | POA: Insufficient documentation

## 2022-05-04 HISTORY — PX: IR EXCHANGE BILIARY DRAIN: IMG6046

## 2022-05-04 HISTORY — PX: IR REMOVAL OF CALCULI/DEBRIS BILIARY DUCT/GB: IMG6054

## 2022-05-04 HISTORY — PX: IR BALLOON DILATION OF BILIARY DUCTS/AMPULLA: IMG6052

## 2022-05-04 HISTORY — PX: IR CONVERT BILIARY DRAIN TO INT EXT BILIARY DRAIN: IMG6045

## 2022-05-04 HISTORY — PX: IR CHOLANGIOGRAM EXISTING TUBE: IMG6040

## 2022-05-04 LAB — CBC
HCT: 46.1 % (ref 39.0–52.0)
Hemoglobin: 15 g/dL (ref 13.0–17.0)
MCH: 29.5 pg (ref 26.0–34.0)
MCHC: 32.5 g/dL (ref 30.0–36.0)
MCV: 90.7 fL (ref 80.0–100.0)
Platelets: 268 10*3/uL (ref 150–400)
RBC: 5.08 MIL/uL (ref 4.22–5.81)
RDW: 13.2 % (ref 11.5–15.5)
WBC: 10.9 10*3/uL — ABNORMAL HIGH (ref 4.0–10.5)
nRBC: 0 % (ref 0.0–0.2)

## 2022-05-04 LAB — COMPREHENSIVE METABOLIC PANEL
ALT: 22 U/L (ref 0–44)
AST: 31 U/L (ref 15–41)
Albumin: 3.7 g/dL (ref 3.5–5.0)
Alkaline Phosphatase: 74 U/L (ref 38–126)
Anion gap: 9 (ref 5–15)
BUN: 7 mg/dL — ABNORMAL LOW (ref 8–23)
CO2: 29 mmol/L (ref 22–32)
Calcium: 9.8 mg/dL (ref 8.9–10.3)
Chloride: 102 mmol/L (ref 98–111)
Creatinine, Ser: 0.96 mg/dL (ref 0.61–1.24)
GFR, Estimated: >60 mL/min (ref >60)
Glucose, Bld: 117 mg/dL — ABNORMAL HIGH (ref 70–99)
Potassium: 3.8 mmol/L (ref 3.5–5.1)
Sodium: 140 mmol/L (ref 135–145)
Total Bilirubin: 0.6 mg/dL (ref 0.3–1.2)
Total Protein: 7.6 g/dL (ref 6.5–8.1)

## 2022-05-04 LAB — PROTIME-INR
INR: 1 (ref 0.8–1.2)
Prothrombin Time: 13.2 seconds (ref 11.4–15.2)

## 2022-05-04 MED ORDER — LIDOCAINE HCL 1 % IJ SOLN
INTRAMUSCULAR | Status: AC
  Administered 2022-05-04: 10 mL
  Filled 2022-05-04: qty 20

## 2022-05-04 MED ORDER — SODIUM CHLORIDE 0.9 % IV SOLN: INTRAVENOUS | Status: DC

## 2022-05-04 MED ORDER — SODIUM CHLORIDE 0.9% FLUSH: 5.0000 mL | Freq: Three times a day (TID) | INTRAVENOUS | Status: DC

## 2022-05-04 MED ORDER — FENTANYL CITRATE (PF) 100 MCG/2ML IJ SOLN
INTRAMUSCULAR | Status: AC
  Filled 2022-05-04: qty 2

## 2022-05-04 MED ORDER — MIDAZOLAM HCL 2 MG/2ML IJ SOLN
INTRAMUSCULAR | Status: AC
  Filled 2022-05-04: qty 2

## 2022-05-04 MED ORDER — FENTANYL CITRATE (PF) 100 MCG/2ML IJ SOLN
INTRAMUSCULAR | Status: AC | PRN
  Administered 2022-05-04 (×4): 50 ug via INTRAVENOUS

## 2022-05-04 MED ORDER — IOHEXOL 300 MG/ML  SOLN
100.0000 mL | Freq: Once | INTRAMUSCULAR | Status: AC | PRN
  Administered 2022-05-04: 45 mL

## 2022-05-04 MED ORDER — SODIUM CHLORIDE 0.9 % IV SOLN
2.0000 g | Freq: Once | INTRAVENOUS | Status: AC
  Administered 2022-05-04: 2 g via INTRAVENOUS
  Filled 2022-05-04: qty 2

## 2022-05-04 MED ORDER — MIDAZOLAM HCL 2 MG/2ML IJ SOLN
INTRAMUSCULAR | Status: AC | PRN
  Administered 2022-05-04 (×4): 1 mg via INTRAVENOUS

## 2022-05-04 NOTE — H&P (Signed)
Chief Complaint: Patient was seen in consultation today for calculous cholecystitis- gallstone retrieval at the request of [place] Physician: Ruthann Cancer  Patient Status: [facility] - Out-pt  History of Present Illness: Joe [name] is a 77 y.o. male   Hx Lung cancer Calculous cholecystitis Deemed a non operative candidate per [facility]. Perc chole drain placed in IR 01/21/22--- most recent exchange 12/29 Has trouble with leakage occasionally Has had a consult with Dr Serafina Royals 04/08/22 re gallstone retrieval procedure He is scheduled for same today in IR  Dr Serafina Royals consult:  Assessment and Plan: 77 year old male with history of acute calculous cholecystitis status post percutaneous cholecystostomy tube placement on 01/21/22.  He is not an operative candidate for consideration of cholecystectomy.  He is interested in pursuing choledochoscopic guided percutaneous gallstone retrieval in hopes of eventual tube removal.  We discussed the periprocedural expectations, particularly that stone removal may take multiple sessions and it will be at least a month after the final session before the drain can be removed, if at all possible.  He is in agreement and ready to proceed, as long as the procedure will be covered by Medicare.       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   PERIORBITAL CELLULITIS 02/22/2009   Qualifier: Diagnosis of  By: Diona Browner MD, Amy     Retinal detachment    L>>R    Past Surgical History:  Procedure Laterality Date   BICEPS TENDON REPAIR     BRONCHIAL BIOPSY  10/23/2019   Procedure: BRONCHIAL BIOPSIES;  Surgeon: [name], MD;  Location: [facility] ENDOSCOPY;  Service: Pulmonary;;   BRONCHIAL BIOPSY  11/20/2019   Procedure: BRONCHIAL BIOPSIES;  Surgeon: [name], MD;  Location: [facility] ENDOSCOPY;  Service: Pulmonary;;   BRONCHIAL BRUSHINGS  10/23/2019   Procedure: BRONCHIAL BRUSHINGS;  Surgeon: [name], MD;  Location: [facility] ENDOSCOPY;  Service: Pulmonary;;   BRONCHIAL BRUSHINGS  11/20/2019   Procedure: BRONCHIAL BRUSHINGS;  Surgeon: [name], MD;  Location: [facility] ENDOSCOPY;  Service: Pulmonary;;   BRONCHIAL NEEDLE ASPIRATION BIOPSY  10/23/2019   Procedure: BRONCHIAL NEEDLE ASPIRATION BIOPSIES;  Surgeon: [name], MD;  Location: [facility] ENDOSCOPY;  Service: Pulmonary;;   BRONCHIAL NEEDLE ASPIRATION BIOPSY  11/20/2019   Procedure: BRONCHIAL NEEDLE ASPIRATION BIOPSIES;  Surgeon: [name], MD;  Location: [place];  Service: Pulmonary;;   BRONCHIAL WASHINGS  11/20/2019   Procedure: BRONCHIAL WASHINGS;  Surgeon: [name], MD;  Location: [place];  Service: Pulmonary;;   CATARACT EXTRACTION Left     COLONOSCOPY  04-14-99   Dr Flossie Dibble polyp-TA in epic   IR [place]  02/02/2022   IR EXCHANGE BILIARY [place]  03/19/2022   IR PERC CHOLECYSTOSTOMY  01/21/2022   IR RADIOLOGIST EVAL & MGMT  04/08/2022   POLYPECTOMY  04-14-99   POSTERIOR LAMINECTOMY / DECOMPRESSION CERVICAL SPINE     Dr Saintclair Halsted   RETINAL DETACHMENT SURGERY     left eye, 2007 x2, 2008 x 3   ROTATOR CUFF REPAIR  2004   right   TONSILLECTOMY  [id number]   VIDEO BRONCHOSCOPY WITH ENDOBRONCHIAL NAVIGATION N/A 10/23/2019   Procedure: VIDEO BRONCHOSCOPY WITH ENDOBRONCHIAL NAVIGATION;  Surgeon: [name], MD;  Location: [place] ENDOSCOPY;  Service: Pulmonary;  Laterality: N/A;   VIDEO BRONCHOSCOPY WITH ENDOBRONCHIAL NAVIGATION N/A 11/20/2019   Procedure: VIDEO BRONCHOSCOPY WITH ENDOBRONCHIAL NAVIGATION;  Surgeon: [name], MD;  Location: [place] ENDOSCOPY;  Service: Pulmonary;  Laterality: N/A;   VIDEO BRONCHOSCOPY WITH ENDOBRONCHIAL ULTRASOUND N/A 10/23/2019   Procedure: VIDEO BRONCHOSCOPY WITH ENDOBRONCHIAL ULTRASOUND;  Surgeon: [name], MD;  Location: [place] ENDOSCOPY;  Service: Pulmonary;  Laterality: N/A;    Allergies: Patient has no known allergies.  Medications: Prior to Admission medications   Medication Sig Start Date End Date Taking? Authorizing Provider  amLODipine (NORVASC) 5 MG tablet TAKE 1 TABLET BY MOUTH EVERY DAY 11/09/21  Yes [name], [name], MD  apixaban (ELIQUIS) 5 MG TABS tablet TAKE 1 TABLET(5 MG) BY MOUTH TWICE DAILY Patient taking differently: Take 5 mg by mouth 2 (two) times daily. 09/25/21  Yes Turner, Eber Hong, MD  atenolol (TENORMIN) 25 MG tablet Take 1 tablet (25 mg total) by mouth daily as needed (palpitations). 07/29/21  Yes [name], [name], MD  Cholecalciferol 25 MCG (1000 UT) tablet Take 1,000 Units by mouth daily.   Yes [provider]  diazepam (VALIUM) 5 MG tablet Take 1 tablet (5 mg total) by mouth every 12 (twelve) hours as needed for anxiety. Patient taking  differently: Take 5 mg by mouth every 12 (twelve) hours as needed for anxiety (sleep). 07/29/21  Yes [name], [name], MD  levothyroxine (SYNTHROID) 125 MCG tablet Take 1 tablet (125 mcg total) by mouth daily before breakfast. 03/03/22  Yes Heilingoetter, Cassandra L, PA-C  lovastatin (MEVACOR) 20 MG tablet Take 20 mg by mouth at bedtime. 02/08/22  Yes [provider]  Multiple Vitamin (MULTIVITAMIN) tablet Take 1 tablet by mouth daily. Centrum Silver.   Yes [provider]  Polyethyl Glycol-Propyl Glycol (SYSTANE ULTRA OP) Place 1 drop into both eyes 2 (two) times daily as needed (dry eyes).   Yes [provider]  Sodium Chloride Flush (NORMAL SALINE FLUSH) 0.9 % SOLN Flush gallbladder drain with 5 - 10 ml once daily 04/05/22  Yes Clapp, Alysia Penna, NP  albuterol (PROVENTIL) (2.5 MG/3ML) 0.083% nebulizer  solution Take 3 mLs (2.5 mg total) by nebulization every 6 (six) hours as needed for wheezing or shortness of breath. 03/04/21   Maryjane Hurter, MD  clotrimazole-betamethasone (LOTRISONE) cream Apply topically 2 (two) times daily. Patient not taking: Reported on 04/16/2022 12/24/20   [name], [name], MD     Family History  Problem Relation Age of Onset   COPD Mother    Diabetes Father    Coronary artery disease Other    Breast cancer Paternal Aunt 23   Colon cancer Neg Hx     Social History   Socioeconomic History   Marital status: Widowed    Spouse name: Not on file   Number of children: 1   Years of education: Not on file   Highest education level: Not on file  Occupational History   Occupation: retired  Tobacco Use   Smoking status: Former    Packs/day: 0.80    Years: 50.00    Total pack years: 40.00    Types: Cigarettes    Quit date: 08/27/2012    Years since quitting: 9.6   Smokeless tobacco: Never  Vaping Use   Vaping Use: Never used  Substance and Sexual Activity   Alcohol use: Not Currently   Drug use: No   Sexual activity: Not  Currently  Other Topics Concern   Not on file  Social History Narrative   Not on file   Social Determinants of Health   Financial Resource Strain: Low Risk  (09/23/2021)   Overall Financial Resource Strain (CARDIA)    Difficulty of Paying Living Expenses: Not hard at all  Food Insecurity: No Food Insecurity (01/20/2022)   Hunger Vital Sign    Worried About Running Out of Food in the Last Year: Never true    [place] in the Last Year: Never true  Transportation Needs: No Transportation Needs (01/20/2022)   PRAPARE - Hydrologist (Medical): No    Lack of Transportation (Non-Medical): No  Physical Activity: Insufficiently Active (09/23/2021)   Exercise Vital Sign    Days of Exercise per Week: 4 days    Minutes of Exercise per Session: 30 min  Stress: No Stress Concern Present (09/23/2021)   [place]    Feeling of Stress : Not at all  Social Connections: Moderately Integrated (09/23/2021)   Social Connection and Isolation Panel [NHANES]    Frequency of Communication with Friends and Family: More than three times a week    Frequency of Social Gatherings with Friends and Family: Three times a week    Attends Religious Services: 1 to 4 times per year    Active Member of Clubs or Organizations: Yes    Attends Archivist Meetings: More than 4 times per year    Marital Status: Widowed    Review of Systems: A 12 point ROS discussed and pertinent positives are indicated in the HPI above.  All other systems are negative.  Review of Systems  Constitutional:  Negative for activity change, fatigue and fever.  Respiratory:  Negative for cough and shortness of breath.   Cardiovascular:  Negative for chest pain.  Gastrointestinal:  Negative for abdominal pain, nausea and vomiting.  Neurological:  Negative for weakness.  Psychiatric/Behavioral:  Negative for behavioral problems and  confusion.     Vital Signs: BP 133/65   Pulse 61   Temp 97.7 F (36.5 C) (Temporal)   Resp 17  Ht 5\' 11"  (1.803 m)   Wt 176 lb (79.8 kg)   SpO2 100%   BMI 24.55 kg/m     Physical Exam Vitals reviewed.  HENT:     Mouth/Throat:     Mouth: Mucous membranes are moist.  Cardiovascular:     Rate and Rhythm: Normal rate and regular rhythm.     Heart sounds: Normal heart sounds.  Pulmonary:     Effort: Pulmonary effort is normal.     Breath sounds: Normal breath sounds.  Abdominal:     General: There is no distension.     Palpations: Abdomen is soft.     Tenderness: There is no abdominal tenderness.     Comments: Site of perc chole drain intact Clean and dry NT No bleeding OP - yellow; thick; +gallstones in bag  Musculoskeletal:        General: Normal range of motion.  Skin:    General: Skin is warm.  Neurological:     Mental Status: He is alert and oriented to person, place, and time.  Psychiatric:        Behavior: Behavior normal.     Imaging: IR Radiologist Eval & Mgmt  Result Date: 04/08/2022 EXAM: NEW PATIENT OFFICE VISIT CHIEF COMPLAINT: See Epic note. HISTORY OF PRESENT ILLNESS: See Epic note. REVIEW OF SYSTEMS: See Epic note. PHYSICAL EXAMINATION: See Epic note. ASSESSMENT AND PLAN: See Epic note. Ruthann Cancer, MD [facility] Electronically Signed   By: Ruthann Cancer M.D.   On: 04/08/2022 16:13    Labs:  CBC: Recent Labs    01/23/22 0705 01/24/22 0542 02/01/22 1043 05/04/22 0748  WBC 16.1* 11.7* 10.2 10.9*  HGB 11.2* 11.7* 13.6 15.0  HCT 37.4* 38.3* 43.1 46.1  PLT 322 225 330.0 268    COAGS: Recent Labs    01/20/22 2127 05/04/22 0748  INR 1.3* 1.0    BMP: Recent Labs    01/21/22 0723 01/22/22 0528 01/23/22 0705 01/24/22 0542 02/01/22 1043  NA 142 138 135 136 140  K 4.6 3.9 3.6 3.7 4.0  CL 106 103 99 99 101  CO2 31 29 31 31  34*  GLUCOSE 99 119* 116* 102* 86  BUN 8 9 8 8  6   CALCIUM 8.8* 8.0* 8.2* 8.3* 9.5  CREATININE 0.78 0.81 0.72 0.43* 0.68  GFRNONAA >60 >60 >60 >60  --     LIVER FUNCTION TESTS: Recent Labs    01/21/22 0723 01/22/22 0528 01/23/22 0705 02/01/22 1043  BILITOT 1.5* 1.5* 1.2 1.0  AST 41 33 27 23  ALT 54* 44 38 21  ALKPHOS 130* 106 90 80  PROT 6.1* 5.8* 5.9* 7.4  ALBUMIN 2.4* 2.3* 2.3* 3.6    TUMOR MARKERS: No results for input(s): "AFPTM", "CEA", "CA199", "CHROMGRNA" in the last 8760 hours.  Assessment and Plan:  Scheduled for choledochoscopic percutaneous gallstone retrieval in IR today LD Eliquis 3 days ago Pt is aware of procedure risks and benefits; including but not limited to: infection; bleeding; damage ot organ; damage to surrounding structures He is agreeable to proceed Consent signed   Thank you for this interesting consult.  I greatly enjoyed meeting Joe [name] and look forward to participating in their care.  A copy of this report was sent to the requesting provider on this date.  Electronically Signed: Lavonia Drafts, PA-C 05/04/2022, 8:07 AM   I spent a total of    25 Minutes in face to face in clinical consultation,  greater than 50% of which was counseling/coordinating care for choledochoscopic percutaneous gallstone retrieval

## 2022-05-04 NOTE — Procedures (Signed)
Interventional Radiology Procedure Note  Procedure:  1) Cholangiogram 2) Biliary cholangioscopy 3) Cholangioplasty 3) Conversion of cholecystostomy to internal/external cholecysto-biliary drain  Findings: Please refer to procedural dictation for full description.  Unsuccessful gallstone retrieval due to complete collapse of gallbladder.  Cholecystostomy drain was converted to 10 Fr internal/external biliary type drain via established gallbladder acces through cystic and common ducts into duodenum.    Complications: None immediate  Estimated Blood Loss: < 5 mL  Recommendations: IR will arrange 2 week follow up for repeat cholangiogram/gallstone retrieval attempt. Keep drain to bag drainage.   Ruthann Cancer, MD

## 2022-05-05 ENCOUNTER — Other Ambulatory Visit (HOSPITAL_COMMUNITY): Payer: Self-pay | Admitting: Interventional Radiology

## 2022-05-05 ENCOUNTER — Encounter (HOSPITAL_COMMUNITY): Payer: Self-pay

## 2022-05-05 DIAGNOSIS — K8000 Calculus of gallbladder with acute cholecystitis without obstruction: Secondary | ICD-10-CM

## 2022-05-06 ENCOUNTER — Other Ambulatory Visit (HOSPITAL_COMMUNITY): Payer: Self-pay | Admitting: Interventional Radiology

## 2022-05-06 DIAGNOSIS — K8000 Calculus of gallbladder with acute cholecystitis without obstruction: Secondary | ICD-10-CM

## 2022-05-08 ENCOUNTER — Other Ambulatory Visit: Payer: Self-pay | Admitting: Physician Assistant

## 2022-05-08 ENCOUNTER — Other Ambulatory Visit: Payer: Self-pay | Admitting: Internal Medicine

## 2022-05-08 DIAGNOSIS — E039 Hypothyroidism, unspecified: Secondary | ICD-10-CM

## 2022-05-08 MED ORDER — LEVOTHYROXINE SODIUM 125 MCG PO TABS: 125.0000 ug | ORAL_TABLET | Freq: Every day | ORAL | 2 refills | Status: DC

## 2022-05-11 ENCOUNTER — Other Ambulatory Visit: Payer: Self-pay | Admitting: Interventional Radiology

## 2022-05-11 DIAGNOSIS — K8001 Calculus of gallbladder with acute cholecystitis with obstruction: Secondary | ICD-10-CM

## 2022-05-17 ENCOUNTER — Ambulatory Visit
Admission: RE | Admit: 2022-05-17 | Discharge: 2022-05-17 | Disposition: A | Discharge status: Home / Self Care | Payer: Medicare Other | Source: Ambulatory Visit | Attending: Interventional Radiology | Admitting: Interventional Radiology

## 2022-05-17 ENCOUNTER — Other Ambulatory Visit: Payer: Self-pay | Admitting: Internal Medicine

## 2022-05-17 ENCOUNTER — Other Ambulatory Visit: Payer: Self-pay

## 2022-05-17 ENCOUNTER — Ambulatory Visit (HOSPITAL_COMMUNITY)
Admission: RE | Admit: 2022-05-17 | Discharge: 2022-05-17 | Disposition: A | Discharge status: Home / Self Care | Payer: Medicare Other | Source: Ambulatory Visit | Attending: Internal Medicine | Admitting: Internal Medicine

## 2022-05-17 ENCOUNTER — Inpatient Hospital Stay: Payer: Medicare Other | Attending: Internal Medicine

## 2022-05-17 DIAGNOSIS — Z79633 Long term (current) use of mitotic inhibitor: Secondary | ICD-10-CM | POA: Diagnosis not present

## 2022-05-17 DIAGNOSIS — Z79899 Other long term (current) drug therapy: Secondary | ICD-10-CM | POA: Diagnosis not present

## 2022-05-17 DIAGNOSIS — C3412 Malignant neoplasm of upper lobe, left bronchus or lung: Secondary | ICD-10-CM | POA: Insufficient documentation

## 2022-05-17 DIAGNOSIS — K8020 Calculus of gallbladder without cholecystitis without obstruction: Secondary | ICD-10-CM | POA: Diagnosis not present

## 2022-05-17 DIAGNOSIS — Z79630 Long term (current) use of alkylating agent: Secondary | ICD-10-CM | POA: Insufficient documentation

## 2022-05-17 DIAGNOSIS — C3490 Malignant neoplasm of unspecified part of unspecified bronchus or lung: Secondary | ICD-10-CM | POA: Insufficient documentation

## 2022-05-17 DIAGNOSIS — E039 Hypothyroidism, unspecified: Secondary | ICD-10-CM

## 2022-05-17 DIAGNOSIS — K8001 Calculus of gallbladder with acute cholecystitis with obstruction: Secondary | ICD-10-CM

## 2022-05-17 DIAGNOSIS — I700 Atherosclerosis of aorta: Secondary | ICD-10-CM | POA: Insufficient documentation

## 2022-05-17 DIAGNOSIS — I7121 Aneurysm of the ascending aorta, without rupture: Secondary | ICD-10-CM | POA: Insufficient documentation

## 2022-05-17 DIAGNOSIS — J439 Emphysema, unspecified: Secondary | ICD-10-CM | POA: Diagnosis not present

## 2022-05-17 DIAGNOSIS — K859 Acute pancreatitis without necrosis or infection, unspecified: Secondary | ICD-10-CM | POA: Insufficient documentation

## 2022-05-17 DIAGNOSIS — Z434 Encounter for attention to other artificial openings of digestive tract: Secondary | ICD-10-CM | POA: Diagnosis not present

## 2022-05-17 DIAGNOSIS — J8410 Pulmonary fibrosis, unspecified: Secondary | ICD-10-CM | POA: Diagnosis not present

## 2022-05-17 HISTORY — PX: IR RADIOLOGIST EVAL & MGMT: IMG5224

## 2022-05-17 LAB — CMP (CANCER CENTER ONLY)
ALT: 22 U/L (ref 0–44)
AST: 24 U/L (ref 15–41)
Albumin: 4.4 g/dL (ref 3.5–5.0)
Alkaline Phosphatase: 124 U/L (ref 38–126)
Anion gap: 7 (ref 5–15)
BUN: 10 mg/dL (ref 8–23)
CO2: 27 mmol/L (ref 22–32)
Calcium: 10.2 mg/dL (ref 8.9–10.3)
Chloride: 96 mmol/L — ABNORMAL LOW (ref 98–111)
Creatinine: 0.97 mg/dL (ref 0.61–1.24)
GFR, Estimated: >60 mL/min (ref >60)
Glucose, Bld: 92 mg/dL (ref 70–99)
Potassium: 5.1 mmol/L (ref 3.5–5.1)
Sodium: 130 mmol/L — ABNORMAL LOW (ref 135–145)
Total Bilirubin: 0.8 mg/dL (ref 0.3–1.2)
Total Protein: 8.8 g/dL — ABNORMAL HIGH (ref 6.5–8.1)

## 2022-05-17 LAB — CBC WITH DIFFERENTIAL (CANCER CENTER ONLY)
Abs Immature Granulocytes: 0.77 10*3/uL — ABNORMAL HIGH (ref 0.00–0.07)
Basophils Absolute: 0.1 10*3/uL (ref 0.0–0.1)
Basophils Relative: 0 %
Eosinophils Absolute: 0.2 10*3/uL (ref 0.0–0.5)
Eosinophils Relative: 1 %
HCT: 54.4 % — ABNORMAL HIGH (ref 39.0–52.0)
Hemoglobin: 18.3 g/dL — ABNORMAL HIGH (ref 13.0–17.0)
Immature Granulocytes: 3 %
Lymphocytes Relative: 26 %
Lymphs Abs: 6.5 10*3/uL — ABNORMAL HIGH (ref 0.7–4.0)
MCH: 29.8 pg (ref 26.0–34.0)
MCHC: 33.6 g/dL (ref 30.0–36.0)
MCV: 88.6 fL (ref 80.0–100.0)
Monocytes Absolute: 1.1 10*3/uL — ABNORMAL HIGH (ref 0.1–1.0)
Monocytes Relative: 5 %
Neutro Abs: 16.4 10*3/uL — ABNORMAL HIGH (ref 1.7–7.7)
Neutrophils Relative %: 65 %
Platelet Count: 347 10*3/uL (ref 150–400)
RBC: 6.14 MIL/uL — ABNORMAL HIGH (ref 4.22–5.81)
RDW: 13.1 % (ref 11.5–15.5)
WBC Count: 25.1 10*3/uL — ABNORMAL HIGH (ref 4.0–10.5)
nRBC: 0 % (ref 0.0–0.2)

## 2022-05-17 LAB — TSH: TSH: 12.458 u[IU]/mL — ABNORMAL HIGH (ref 0.350–4.500)

## 2022-05-17 MED ORDER — SODIUM CHLORIDE (PF) 0.9 % IJ SOLN
INTRAMUSCULAR | Status: AC
  Filled 2022-05-17: qty 50

## 2022-05-17 MED ORDER — IOHEXOL 300 MG/ML  SOLN
75.0000 mL | Freq: Once | INTRAMUSCULAR | Status: AC | PRN
  Administered 2022-05-17: 75 mL via INTRAVENOUS

## 2022-05-17 NOTE — Progress Notes (Signed)
Reason for follow up:  cholecystostomy drain follow up   History of present illness: 77 year old male with history of acute calculous cholecystitis status post percutaneous cholecystostomy tube placement on 01/21/22. He is not an operative candidate for consideration of cholecystectomy.   He presents today via virtual telephone clinic visit.  On 05/04/22 Mr. Newgent underwent attempted choledochoscopic-guided percutaneous gallstone retrieval.  Unfortunately this was unsuccessful due to complete collapse of the gallbladder when inserting large sheath and inability to reliably localize or retrieve stones.  Given these findings and inability to definitely define the gallbladder lumen, decision was made to place an internal/external biliary-type drain through the cystic duct and into the duodenum.   Interested now in converting back to traditional cholecystostomy drain.  He has had significant output from the internal/external drain, unable to quantify directly. No bleeding from drain. Has experience constipation requiring laxative use and stool softener.  He reports some dull upper abdominal aching which has persisted.   He reports fatigue.  No fevers or chills.  He has been taking his BP and HR regularly which has been normal.      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   PERIORBITAL CELLULITIS 02/22/2009   Qualifier: Diagnosis of  By: Diona Browner MD, Amy     Retinal detachment    L>>R    Past Surgical History:  Procedure Laterality Date   BICEPS TENDON REPAIR     BRONCHIAL BIOPSY  10/23/2019   Procedure: BRONCHIAL BIOPSIES;  Surgeon: [name], MD;  Location: [facility] ENDOSCOPY;  Service: Pulmonary;;   BRONCHIAL BIOPSY  11/20/2019   Procedure: BRONCHIAL BIOPSIES;  Surgeon: [name], MD;  Location: [facility] ENDOSCOPY;  Service: Pulmonary;;   BRONCHIAL BRUSHINGS  10/23/2019   Procedure: BRONCHIAL BRUSHINGS;  Surgeon: [name], MD;  Location: [facility] ENDOSCOPY;  Service: Pulmonary;;   BRONCHIAL BRUSHINGS  11/20/2019   Procedure: BRONCHIAL BRUSHINGS;  Surgeon: [name], MD;  Location: [facility] ENDOSCOPY;  Service: Pulmonary;;   BRONCHIAL NEEDLE ASPIRATION BIOPSY  10/23/2019   Procedure: BRONCHIAL NEEDLE ASPIRATION BIOPSIES;  Surgeon: [name], MD;  Location: [facility] ENDOSCOPY;  Service: Pulmonary;;   BRONCHIAL NEEDLE ASPIRATION BIOPSY  11/20/2019   Procedure: BRONCHIAL NEEDLE ASPIRATION BIOPSIES;  Surgeon: [name], MD;  Location: [facility] ENDOSCOPY;  Service: Pulmonary;;   BRONCHIAL WASHINGS  11/20/2019   Procedure: BRONCHIAL WASHINGS;  Surgeon: [name], MD;  Location: [place];  Service: Pulmonary;;   CATARACT EXTRACTION Left    COLONOSCOPY   04-14-99   Dr Flossie Dibble polyp-TA in epic   IR [place] DUCTS/AMPULLA  05/04/2022   IR CONVERT BILIARY DRAIN TO INT EXT BILIARY DRAIN  05/04/2022   IR EXCHANGE BILIARY DRAIN  02/02/2022   IR EXCHANGE BILIARY DRAIN  03/19/2022   IR PERC CHOLECYSTOSTOMY  01/21/2022   IR RADIOLOGIST EVAL & MGMT  04/08/2022   IR REMOVAL OF CALCULI/DEBRIS BILIARY DUCT/GB  05/04/2022   POLYPECTOMY  04-14-99   POSTERIOR LAMINECTOMY / DECOMPRESSION CERVICAL SPINE     Dr Saintclair Halsted   RETINAL DETACHMENT SURGERY     left eye, 2007 x2, 2008 x 3   ROTATOR CUFF REPAIR  2004   right   TONSILLECTOMY  [id number]   VIDEO BRONCHOSCOPY WITH ENDOBRONCHIAL NAVIGATION N/A 10/23/2019   Procedure: VIDEO BRONCHOSCOPY WITH ENDOBRONCHIAL NAVIGATION;  Surgeon: [name], MD;  Location: [facility] ENDOSCOPY;  Service: Pulmonary;  Laterality: N/A;   VIDEO BRONCHOSCOPY WITH ENDOBRONCHIAL NAVIGATION N/A 11/20/2019   Procedure: VIDEO BRONCHOSCOPY WITH ENDOBRONCHIAL NAVIGATION;  Surgeon: [name], MD;  Location: [place] ENDOSCOPY;  Service: Pulmonary;  Laterality: N/A;   VIDEO BRONCHOSCOPY WITH ENDOBRONCHIAL ULTRASOUND N/A 10/23/2019   Procedure: VIDEO BRONCHOSCOPY WITH ENDOBRONCHIAL ULTRASOUND;  Surgeon: [name], MD;  Location: [facility] ENDOSCOPY;  Service: Pulmonary;  Laterality: N/A;    Allergies: Patient has no known allergies.  Medications: Prior to Admission medications   Medication Sig Start Date End Date Taking? Authorizing Provider  albuterol (PROVENTIL) (2.5 MG/3ML) 0.083% nebulizer solution Take 3 mLs (2.5 mg total) by nebulization every 6 (six) hours as needed for wheezing or shortness of breath. 03/04/21   Maryjane Hurter, MD  amLODipine (NORVASC) 5 MG tablet TAKE 1 TABLET BY MOUTH EVERY DAY 11/09/21   [name], [name], MD  apixaban (ELIQUIS) 5 MG TABS tablet TAKE 1 TABLET(5 MG) BY MOUTH TWICE DAILY Patient taking differently: Take 5 mg by mouth 2 (two) times daily. 09/25/21   Sueanne Margarita, MD  atenolol  (TENORMIN) 25 MG tablet Take 1 tablet (25 mg total) by mouth daily as needed (palpitations). 07/29/21   [name], [name], MD  Cholecalciferol 25 MCG (1000 UT) tablet Take 1,000 Units by mouth daily.    [provider]  clotrimazole-betamethasone (LOTRISONE) cream Apply topically 2 (two) times daily. Patient not taking: Reported on 04/16/2022 12/24/20   [name], [name], MD  diazepam (VALIUM) 5 MG tablet Take 1 tablet (5 mg total) by mouth every 12 (twelve) hours as needed for anxiety. Patient taking differently: Take 5 mg by mouth every 12 (twelve) hours as needed for anxiety (sleep). 07/29/21   [name], [name], MD  levothyroxine (SYNTHROID) 125 MCG tablet Take 1 tablet (125 mcg total) by mouth daily before breakfast. 05/08/22   Heilingoetter, Cassandra L, PA-C  lovastatin (MEVACOR) 20 MG tablet TAKE 1 TABLET BY MOUTH EVERY NIGHT AT BEDTIME 05/10/22   [name], [name], MD  Multiple Vitamin (MULTIVITAMIN) tablet Take 1 tablet by mouth daily. Centrum Silver.    [provider]  Polyethyl Glycol-Propyl Glycol (SYSTANE ULTRA OP) Place 1 drop into both eyes 2 (two) times daily as needed (dry eyes).    [provider]  Sodium Chloride Flush (NORMAL SALINE FLUSH) 0.9 % SOLN Flush gallbladder drain with 5 - 10 ml once daily 04/05/22   Tyson Alias, NP     Family History  Problem Relation Age of Onset   COPD Mother    Diabetes Father    Coronary artery disease Other    Breast cancer Paternal Aunt 71   Colon cancer Neg Hx     Social History   Socioeconomic History   Marital status: Widowed    Spouse name: Not on file   Number of children: 1   Years of education: Not on file   Highest education level: Not on file  Occupational History   Occupation: retired  Tobacco Use   Smoking status: Former    Packs/day: 0.80    Years: 50.00    Total pack years: 40.00    Types: Cigarettes    Quit date: 08/27/2012    Years since quitting: 9.7   Smokeless tobacco:  Never  Vaping Use   Vaping Use: Never used  Substance and Sexual Activity   Alcohol use: Not Currently   Drug use: No   Sexual activity: Not Currently  Other Topics Concern   Not on file  Social History Narrative   Not on file   Social Determinants of Health   Financial Resource Strain: Low Risk  (09/23/2021)   Overall Financial Resource Strain (CARDIA)    Difficulty of Paying Living Expenses: Not hard at all  Food Insecurity: No Food Insecurity (01/20/2022)   Hunger Vital Sign    Worried About Running Out of Food in the Last Year: Never true    [place] in the Last Year: Never true  Transportation Needs: No Transportation Needs (01/20/2022)   PRAPARE - Hydrologist (Medical): No    Lack of Transportation (Non-Medical): No  Physical Activity: Insufficiently Active (09/23/2021)   Exercise Vital Sign    Days of Exercise per Week: 4 days    Minutes of Exercise per Session: 30 min  Stress: No Stress Concern Present (09/23/2021)   [place]    Feeling of Stress : Not at all  Social Connections: Moderately Integrated (09/23/2021)   Social Connection and Isolation Panel [NHANES]    Frequency of Communication with Friends and Family: More than three times a week    Frequency of Social Gatherings with Friends and Family: Three times a week    Attends Religious Services: 1 to 4 times per year    Active Member of Clubs or Organizations: Yes    Attends Archivist Meetings: More than 4 times per year    Marital Status: Widowed     Vital Signs: There were no vitals taken for this visit.  No physical examination was performed in lieu of virtual telephone clinic visit.   Imaging: 05/04/22    Labs:  CBC: Recent Labs    01/23/22 0705 01/24/22 0542 02/01/22 1043 05/04/22 0748  WBC 16.1* 11.7* 10.2 10.9*  HGB 11.2* 11.7* 13.6 15.0  HCT 37.4* 38.3* 43.1 46.1  PLT 322 225  330.0 268    COAGS: Recent Labs    01/20/22 2127 05/04/22 0748  INR 1.3* 1.0    BMP: Recent Labs    01/22/22 0528 01/23/22 0705 01/24/22 0542 02/01/22 1043 05/04/22 0748  NA 138 135 136 140 140  K 3.9 3.6 3.7 4.0 3.8  CL 103 99 99 101 102  CO2 '29 31 31 '$ 34* 29  GLUCOSE 119* 116* 102* 86 117*  BUN '9 8 8 6 '$ 7*  CALCIUM 8.0* 8.2* 8.3* 9.5 9.8  CREATININE 0.81 0.72 0.43* 0.68 0.96  GFRNONAA >60 >60 >60  --  >60    LIVER FUNCTION TESTS: Recent Labs    01/22/22 0528 01/23/22 0705 02/01/22 1043 05/04/22 0748  BILITOT 1.5* 1.2 1.0 0.6  AST 33 '27 23 31  '$ ALT 44 38 21 22  ALKPHOS 106 90 80 74  PROT 5.8* 5.9* 7.4 7.6  ALBUMIN 2.3* 2.3* 3.6 3.7    Assessment and Plan: 77 year old male with history of acute calculous cholecystitis status post percutaneous cholecystostomy tube placement on 01/21/22. He is a non operative candidate.  Attempted Spyglass-assisted percutaneous gallstone retrieval on 05/04/22 was unsuccessful due to collapse of the gallbladder, therefore an internal/external drain was placed through the established access.   Instead of pursuing additional attempts at gallstone retrieval, Mr. Bolich is interested in conversion back to cholecystostomy drain.    -Plan for conversion of internal/external biliary type drain back to cholecystostomy drain on 05/26/21 at [facility].  He had a standing appointment for repeat Spyglass attempt on that day.  No need to hold any anticoagulation prior to this procedure which will still be with moderate sedation. -Approximately 8 weeks after conversion, if successful, we will plan for drain exchange and trial of capping.   Electronically Signed: Suzette Battiest 05/17/2022, 7:31 AM   I spent a total of 25 Minutes in virtual telephone clinical consultation, greater than 50% of which was counseling/coordinating care for cholecystostomy drain.

## 2022-05-18 ENCOUNTER — Other Ambulatory Visit: Payer: Self-pay | Admitting: Physician Assistant

## 2022-05-18 ENCOUNTER — Telehealth: Payer: Self-pay | Admitting: Medical Oncology

## 2022-05-18 ENCOUNTER — Telehealth: Payer: Self-pay | Admitting: Internal Medicine

## 2022-05-18 ENCOUNTER — Encounter (HOSPITAL_COMMUNITY): Payer: Self-pay | Admitting: Diagnostic Radiology

## 2022-05-18 DIAGNOSIS — E039 Hypothyroidism, unspecified: Secondary | ICD-10-CM

## 2022-05-18 MED ORDER — LEVOTHYROXINE SODIUM 137 MCG PO TABS: 125.0000 ug | ORAL_TABLET | Freq: Every day | ORAL | 2 refills | Status: DC

## 2022-05-18 NOTE — Telephone Encounter (Signed)
He is not doing well post  exchange biliary drains- "dull ache , pain around gallbladder . It is throwing me for a loop. The pain comes and goes."  No appetite.  Denies n/v/d or fever.  Dr. Julien Nordmann advised pt to go to ED if he is having abd pain and this was explained to pt. . Pt does not want to go to ED. He asked me to notify Dr Ruthann Cancer , IR.

## 2022-05-18 NOTE — Telephone Encounter (Signed)
Dr. Julio Alm office at the cancer called and states that they are sending the pt to the ED after his scan today. They believe he has pancreatitis.  They would like Dr. Alain Marion to look at impression 5 on his scan  For any additional info please call Dr. Pricilla Larsson office: [phone]

## 2022-05-18 NOTE — Telephone Encounter (Signed)
FYI Message left with Dr Alain Marion office staff. I called pt back that Dr Tawny Asal acknowledged the information.   Pt again adamant about not going to ED. " You know I'm not in as much pain as I have been with biliary drain".  I gave him ED precautions for increased abdominal pain,fever, chill, nausea , vomiting . He voiced understanding.

## 2022-05-18 NOTE — Telephone Encounter (Signed)
Radiology called  -CT result -impression 5- "IMPRESSION: 1. 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 No findings suspicious for recurrent tumor. 2. No mediastinal or hilar mass or adenopathy. 3. Stable emphysematous changes and pulmonary scarring. 4. 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 5. Acute or subacute pancreatitis with inflammatory phlegmon surrounding the pancreas without discrete well-formed pseudocysts. 6. Cholelithiasis with a biliary drainage catheter in place. 7. Aortic atherosclerosis.   Aortic Atherosclerosis (ICD10-I70.0) and Emphysema (ICD10-J43.9)."

## 2022-05-19 NOTE — Telephone Encounter (Signed)
I am out of town for 1 week.   I looked at the CT scan report. I agree with the ER visit recommendation. [name] can contact Dr.Stechschulte's office (general surgery) if they can see him this week. Thank you

## 2022-05-20 ENCOUNTER — Other Ambulatory Visit: Payer: Medicare Other

## 2022-05-20 ENCOUNTER — Telehealth: Payer: Self-pay

## 2022-05-20 NOTE — Telephone Encounter (Signed)
-----   Message from [employer], PA-C sent at 05/18/2022  4:14 PM EST ----- Can you let him know I increased the dose of synthroid. I will recheck his TSH next lab visit once it is made after his next follow up with Dr. Julien Nordmann.  ----- Message ----- From: Interface, Lab In [place] Sent: 05/17/2022   1:55 PM EST To: Cassandra L Heilingoetter, PA-C

## 2022-05-20 NOTE — Telephone Encounter (Signed)
This nurse reached out to patient and made him aware of increased TSH level.  Advised that an increased dose of his Synthorid has been called in to his local pharmacy.  Patient states that he just picked up the prescription today and will start the new dose on tomorrow.  No further questions or concerns noted at this time.

## 2022-05-24 ENCOUNTER — Inpatient Hospital Stay: Payer: Medicare Other | Attending: Internal Medicine | Admitting: Internal Medicine

## 2022-05-24 ENCOUNTER — Other Ambulatory Visit: Payer: Self-pay

## 2022-05-24 VITALS — BP 133/76 | HR 84 | Temp 98.1°F | Resp 16 | Wt 172.9 lb

## 2022-05-24 DIAGNOSIS — Z87891 Personal history of nicotine dependence: Secondary | ICD-10-CM | POA: Insufficient documentation

## 2022-05-24 DIAGNOSIS — D75839 Thrombocytosis, unspecified: Secondary | ICD-10-CM | POA: Diagnosis not present

## 2022-05-24 DIAGNOSIS — I10 Essential (primary) hypertension: Secondary | ICD-10-CM | POA: Diagnosis not present

## 2022-05-24 DIAGNOSIS — C3412 Malignant neoplasm of upper lobe, left bronchus or lung: Secondary | ICD-10-CM | POA: Insufficient documentation

## 2022-05-24 DIAGNOSIS — C3490 Malignant neoplasm of unspecified part of unspecified bronchus or lung: Secondary | ICD-10-CM

## 2022-05-24 DIAGNOSIS — D471 Chronic myeloproliferative disease: Secondary | ICD-10-CM | POA: Diagnosis not present

## 2022-05-24 DIAGNOSIS — Z856 Personal history of leukemia: Secondary | ICD-10-CM | POA: Insufficient documentation

## 2022-05-24 DIAGNOSIS — K8010 Calculus of gallbladder with chronic cholecystitis without obstruction: Secondary | ICD-10-CM | POA: Diagnosis not present

## 2022-05-24 NOTE — Progress Notes (Signed)
[place] [phone]   [phone]  OFFICE PROGRESS NOTE  [name], [name], MD [place] [address]  DIAGNOSIS: 1) Stage IIIb non-small cell lung cancer, squamous cell carcinoma.  The patient presented with a left upper lobe lung mass as well as associated mediastinal lymphadenopathy and contralateral right hilar mild hypermetabolism.  There was heterogeneous marrow uptake in the spine but with more focal areas at L4 without imaging correlation on CT scan.  The patient was diagnosed in August 2021. 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  First dose expected on 12/13/2019. Status post 5 cycles.   Last dose was giving January 14, 2020. 2) Consolidation treatment with immunotherapy with Imfinzi 1500 mg IV every 4 weeks.  First dose February 19, 2020.  Status post 13 cycles.   CURRENT THERAPY: Observation.  INTERVAL HISTORY: Joe [name] 77 y.o. male returns to the neck today for 25-monthfollow-up visit the patient is feeling fine today with no concerning complaints except for the baseline shortness of breath and he is currently on home oxygen.  He denied having any current chest pain, cough or hemoptysis.  He has no nausea, vomiting, diarrhea or constipation.  He has a biliary drainage for the history of cholecystitis.  The patient denied having any abdominal pain.  He has no recent weight loss or night sweats.  He has no headache or visual changes.  He is here today for evaluation with repeat CT scan of the chest for restaging of his disease.  MEDICAL HISTORY: Past Medical History:  Diagnosis Date   Alcohol abuse, daily use 08/24/2010   Stopped 203/21/15   Arthralgia 910/06/2013  9/15 Not related to statins OA    Cataract    [place] lymphocytic [place] ([place] 07-18-2014   chronic  stage 1- no symtoms   CLL ([place] lymphocytic [place]) ([place] 08/08/2014   221-Mar-2016Dr FBurr MedicoStage 0   COPD mixed type ([place] 07/26/2007   Smoker - stopped 6/14    De Quervain's tenosynovitis, right 910-04-15  221-Mar-2015   Depression    at times   Dysrhythmia    PAF   Dysuria 92015-10-04  9/15 - poss stricture Urol ref was offered    Gallstones 11/16/2017   Asymptomatic Pt refused surg ref   Generalized anxiety disorder 09/07/2012   Chronic   Potential benefits of a long term steroid  use as well as potential risks  and complications were explained to the patient and were aknowledged.      GERD 12/02/2006   Chronic      Grade I diastolic dysfunction [id number]  Grief 2Mar 05, 2018  [name] died in 22016/03/21  Gynecomastia 904-Oct-2015  Benign B 22015-03-21   Hyperlipidemia    Hypertension    Hypothyroidism 12/25/2014   203/21/16On Levothyroxine    Intertrigo 02/02/2012   11/13    Neoplasm of uncertain behavior of skin 03/12/2013   12/14 R ear, chest    OSA on CPAP    mild with AHI 9/hr and oxygen desats as low as 75%   Paresis ([place]    right- s/p cerv decompression   PERIORBITAL CELLULITIS 02/22/2009   Qualifier: Diagnosis of  By: BDiona BrownerMD, Amy     Retinal detachment  L>>R    ALLERGIES:  has No Known Allergies.  MEDICATIONS:  Current Outpatient Medications  Medication Sig Dispense Refill   albuterol (PROVENTIL) (2.5 MG/3ML) 0.083% nebulizer solution Take 3 mLs (2.5 mg total) by nebulization every 6 (six) hours as needed for wheezing or shortness of breath. 75 mL 12   amLODipine (NORVASC) 5 MG tablet TAKE 1 TABLET BY MOUTH EVERY DAY 90 tablet 1   apixaban (ELIQUIS) 5 MG TABS tablet TAKE 1 TABLET(5 MG) BY MOUTH TWICE DAILY (Patient taking differently: Take 5 mg by mouth 2 (two) times daily.) 180 tablet 1   atenolol (TENORMIN) 25 MG tablet Take 1 tablet (25 mg total) by mouth daily as needed (palpitations). 90 tablet 0   Cholecalciferol 25 MCG (1000 UT) tablet Take 1,000 Units by mouth daily.      clotrimazole-betamethasone (LOTRISONE) cream Apply topically 2 (two) times daily. (Patient not taking: Reported on 04/16/2022) 45 g 2   diazepam (VALIUM) 5 MG tablet Take 1 tablet (5 mg total) by mouth every 12 (twelve) hours as needed for anxiety. (Patient taking differently: Take 5 mg by mouth every 12 (twelve) hours as needed for anxiety (sleep).) 180 tablet 1   levothyroxine (SYNTHROID) 137 MCG tablet Take 1 tablet (137 mcg total) by mouth daily before breakfast. 30 tablet 2   lovastatin (MEVACOR) 20 MG tablet TAKE 1 TABLET BY MOUTH EVERY NIGHT AT BEDTIME 90 tablet 2   Multiple Vitamin (MULTIVITAMIN) tablet Take 1 tablet by mouth daily. Centrum Silver.     Polyethyl Glycol-Propyl Glycol (SYSTANE ULTRA OP) Place 1 drop into both eyes 2 (two) times daily as needed (dry eyes).     Sodium Chloride Flush (NORMAL SALINE FLUSH) 0.9 % SOLN Flush gallbladder drain with 5 - 10 ml once daily 560 mL 0   No current facility-administered medications for this visit.    SURGICAL HISTORY:  Past Surgical History:  Procedure Laterality Date   BICEPS TENDON REPAIR     BRONCHIAL BIOPSY  10/23/2019   Procedure: BRONCHIAL BIOPSIES;  Surgeon: [name], MD;  Location: [place];  Service: Pulmonary;;   BRONCHIAL BIOPSY  11/20/2019   Procedure: BRONCHIAL BIOPSIES;  Surgeon: [name], MD;  Location: [facility] ENDOSCOPY;  Service: Pulmonary;;   BRONCHIAL BRUSHINGS  10/23/2019   Procedure: BRONCHIAL BRUSHINGS;  Surgeon: [name], MD;  Location: [facility] ENDOSCOPY;  Service: Pulmonary;;   BRONCHIAL BRUSHINGS  11/20/2019   Procedure: BRONCHIAL BRUSHINGS;  Surgeon: [name], MD;  Location: [facility] ENDOSCOPY;  Service: Pulmonary;;   BRONCHIAL NEEDLE ASPIRATION BIOPSY  10/23/2019   Procedure: BRONCHIAL NEEDLE ASPIRATION BIOPSIES;  Surgeon: [name], MD;  Location: [facility] ENDOSCOPY;  Service: Pulmonary;;   BRONCHIAL NEEDLE ASPIRATION BIOPSY  11/20/2019   Procedure: BRONCHIAL NEEDLE ASPIRATION BIOPSIES;  Surgeon:  [name], MD;  Location: [facility] ENDOSCOPY;  Service: Pulmonary;;   BRONCHIAL WASHINGS  11/20/2019   Procedure: BRONCHIAL WASHINGS;  Surgeon: [name], MD;  Location: [facility] ENDOSCOPY;  Service: Pulmonary;;   CATARACT EXTRACTION Left    COLONOSCOPY  04-14-99   Dr Flossie Dibble polyp-TA in epic   IR [place]  05/04/2022   IR CONVERT BILIARY DRAIN TO INT EXT BILIARY DRAIN  05/04/2022   IR EXCHANGE BILIARY DRAIN  02/02/2022   IR EXCHANGE BILIARY DRAIN  03/19/2022   IR PERC CHOLECYSTOSTOMY  01/21/2022   IR RADIOLOGIST EVAL & MGMT  04/08/2022   IR RADIOLOGIST EVAL & MGMT  05/17/2022   IR REMOVAL OF  CALCULI/DEBRIS BILIARY DUCT/GB  05/04/2022   POLYPECTOMY  04-14-99   POSTERIOR LAMINECTOMY / DECOMPRESSION CERVICAL SPINE     Dr Saintclair Halsted   RETINAL DETACHMENT SURGERY     left eye, 2007 x2, 2008 x 3   ROTATOR CUFF REPAIR  2004   right   TONSILLECTOMY  [id number]   VIDEO BRONCHOSCOPY WITH ENDOBRONCHIAL NAVIGATION N/A 10/23/2019   Procedure: VIDEO BRONCHOSCOPY WITH ENDOBRONCHIAL NAVIGATION;  Surgeon: [name], MD;  Location: [facility] ENDOSCOPY;  Service: Pulmonary;  Laterality: N/A;   VIDEO BRONCHOSCOPY WITH ENDOBRONCHIAL NAVIGATION N/A 11/20/2019   Procedure: VIDEO BRONCHOSCOPY WITH ENDOBRONCHIAL NAVIGATION;  Surgeon: [name], MD;  Location: [place] ENDOSCOPY;  Service: Pulmonary;  Laterality: N/A;   VIDEO BRONCHOSCOPY WITH ENDOBRONCHIAL ULTRASOUND N/A 10/23/2019   Procedure: VIDEO BRONCHOSCOPY WITH ENDOBRONCHIAL ULTRASOUND;  Surgeon: [name], MD;  Location: [place] ENDOSCOPY;  Service: Pulmonary;  Laterality: N/A;    REVIEW OF SYSTEMS:  A comprehensive review of systems was negative except for: Constitutional: positive for fatigue Respiratory: positive for dyspnea on exertion   PHYSICAL EXAMINATION: General appearance: alert, cooperative, fatigued, and no distress Head: Normocephalic, without obvious abnormality, atraumatic Neck: no adenopathy, no JVD, supple,  symmetrical, trachea midline, and thyroid not enlarged, symmetric, no tenderness/mass/nodules Lymph nodes: Cervical, supraclavicular, and axillary nodes normal. Resp: clear to auscultation bilaterally Back: symmetric, no curvature. ROM normal. No CVA tenderness. Cardio: regular rate and rhythm, S1, S2 normal, no murmur, click, rub or gallop GI: soft, non-tender; bowel sounds normal; no masses,  no organomegaly Extremities: extremities normal, atraumatic, no cyanosis or edema  ECOG PERFORMANCE STATUS: 1 - Symptomatic but completely ambulatory  Blood pressure 133/76, pulse 84, temperature 98.1 F (36.7 C), temperature source Oral, resp. rate 16, weight 172 lb 14.4 oz (78.4 kg), SpO2 97 %.  LABORATORY DATA: Lab Results  Component Value Date   WBC 25.1 (H) 05/17/2022   HGB 18.3 (H) 05/17/2022   HCT 54.4 (H) 05/17/2022   MCV 88.6 05/17/2022   PLT 347 05/17/2022      Chemistry      Component Value Date/Time   NA 130 (L) 05/17/2022 1102   NA 143 03/10/2017 0911   K 5.1 05/17/2022 1102   K 4.7 03/10/2017 0911   CL 96 (L) 05/17/2022 1102   CO2 27 05/17/2022 1102   CO2 30 (H) 03/10/2017 0911   BUN 10 05/17/2022 1102   BUN 10.7 03/10/2017 0911   CREATININE 0.97 05/17/2022 1102   CREATININE 0.8 03/10/2017 0911      Component Value Date/Time   CALCIUM 10.2 05/17/2022 1102   CALCIUM 9.4 03/10/2017 0911   ALKPHOS 124 05/17/2022 1102   ALKPHOS 76 03/10/2017 0911   AST 24 05/17/2022 1102   AST 34 03/10/2017 0911   ALT 22 05/17/2022 1102   ALT 45 03/10/2017 0911   BILITOT 0.8 05/17/2022 1102   BILITOT 1.41 (H) 03/10/2017 0911       RADIOGRAPHIC STUDIES: CT Chest W Contrast  Result Date: 05/18/2022 CLINICAL DATA:  Non-small cell lung cancer restaging. * Tracking Code: BO * EXAM: CT CHEST WITH CONTRAST TECHNIQUE: Multidetector CT imaging of the chest was performed during intravenous contrast administration. RADIATION DOSE REDUCTION: This exam was performed according to the  departmental dose-optimization program which includes automated exposure control, adjustment of the mA and/or kV according to patient size and/or use of iterative reconstruction technique. CONTRAST:  21m OMNIPAQUE IOHEXOL 300 MG/ML  SOLN COMPARISON:  Chest CT 12/28/2021.  PET-CT 01/15/2022. FINDINGS: Cardiovascular: The heart is  normal in size. No pericardial effusion. Stable tortuosity, ectasia and calcification thoracic aorta. 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 This is stable. Recommend annual imaging followup by CTA or MRA. This recommendation follows 2010 ACCF/AHA/AATS/ACR/ASA/SCA/SCAI/SIR/STS/SVM Guidelines for the Diagnosis and Management of Patients with Thoracic Aortic Disease. Circulation. 2010; [id number]. Aortic aneurysm NOS (ICD10-I71.9). Normal pulmonary arteries. Mediastinum/Nodes: No mediastinal or hilar mass or lymphadenopathy. The esophagus is unremarkable. 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 No findings suspicious for recurrent tumor. Stable significant underlying emphysematous changes and pulmonary scarring. No new pulmonary lesions or acute overlying pulmonary process. No pulmonary nodules to suggest metastatic disease. Stable 5 mm perifissural nodule in the right upper lobe most consistent with a benign lymph node. Upper Abdomen: The upper abdomen demonstrates a biliary drainage catheter in place. The catheter is in the gallbladder and extends through the cystic duct and into the common bile duct and duodenum. Calcified gallstones surround catheter. There are also changes of acute or subacute pancreatitis with inflammatory phlegmon surrounding the pancreas without discrete well-formed pseudocysts. The pancreatic body/tail region demonstrates thickening and inflammation but normal overall enhancement of the pancreas. Musculoskeletal: No significant bony  findings. Remote L1 compression fracture. IMPRESSION: 1. 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 No findings suspicious for recurrent tumor. 2. No mediastinal or hilar mass or adenopathy. 3. Stable emphysematous changes and pulmonary scarring. 4. 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 5. Acute or subacute pancreatitis with inflammatory phlegmon surrounding the pancreas without discrete well-formed pseudocysts. 6. Cholelithiasis with a biliary drainage catheter in place. 7. Aortic atherosclerosis. Aortic Atherosclerosis (ICD10-I70.0) and Emphysema (ICD10-J43.9). These results will be called to the ordering clinician or representative by the Radiologist Assistant, and communication documented in the PACS or [employer]. Electronically Signed   By: Marijo Sanes M.D.   On: 05/18/2022 12:37   IR Radiologist Eval & Mgmt  Result Date: 05/17/2022 EXAM: ESTABLISHED PATIENT OFFICE VISIT CHIEF COMPLAINT: See Epic note. HISTORY OF PRESENT ILLNESS: See Epic note. REVIEW OF SYSTEMS: See Epic note. PHYSICAL EXAMINATION: See Epic note. ASSESSMENT AND PLAN: See Epic note. Ruthann Cancer, MD [facility] Radiology Electronically Signed   By: Ruthann Cancer M.D.   On: 05/17/2022 08:36   IR REMOVAL OF CALCULI/DEBRIS BILIARY DUCT/GB  Result Date: 05/04/2022 INDICATION: 77 year old male with history of calculus cholecystitis status post percutaneous cholecystostomy tube placement on 01/21/2022 presenting for percutaneous gallstone retrieval. EXAM: 1. Percutaneous cholangiogram. 2. Cholangioplasty. 3. Conversion of cholecystostomy tube to internal external biliary drain. MEDICATIONS: 2 g cefoxitin, intravenous; The antibiotic was administered within an appropriate time frame prior to the initiation of the procedure. ANESTHESIA/SEDATION: Moderate (conscious)  sedation was employed during this procedure. A total of Versed 4 mg and Fentanyl 200 mcg was administered intravenously. Moderate Sedation Time: 66 minutes. The patient's level of consciousness and vital signs were monitored continuously by radiology nursing throughout the procedure under my direct supervision. FLUOROSCOPY TIME:  Two hundred eighty-two mGy COMPLICATIONS: None immediate. PROCEDURE: Informed written consent was obtained from the patient after a thorough discussion of the procedural risks, benefits and alternatives. All questions were addressed. Maximal Sterile Barrier Technique was utilized including caps, mask, sterile gowns, sterile gloves, sterile drape, hand hygiene and skin antiseptic. A timeout was performed prior to the initiation of the procedure. The indwelling  cholecystostomy tube in right upper quadrant prepped and draped in standard fashion. Preprocedure scout radiograph demonstrated unchanged position with the drain pigtail near the expected location of the gallbladder. Subcutaneous local anesthetic was administered with 1% lidocaine. Additional for local anesthetic was provided along the catheter tract to the capsule of the liver. Hand injection of contrast demonstrated multifocal rounded filling defects within a relatively decompressed gallbladder. The cystic duct is widely patent. There is brisk antegrade flow of contrast into the nondilated common bile duct which flowed easily into the duodenum. The indwelling catheter was cut to release the pigtail and an Amplatz wire was inserted and coiled in the gallbladder lumen. The drain was removed and exchanged for a 12 [place], angled braided sheath. Through the indwelling sheath, a 4 French angled tip Navicross catheter was inserted and directed toward the infundibulum and into the cystic duct. Wire cannulation with a 0.08 inch glidewire was successful with a wire passing into the distal duodenum. The Navicross catheter was then directed  into the duodenum and the wire was removed. Injection of contrast demonstrated opacification of the duodenum. A 0.018 inch platinum plus wire was then inserted and the catheter was removed. The Amplatz wire was placed alongside the platinum plus through the indwelling 12 French sheath and coiled in the gallbladder lumen. The sheath was then removed and after serial dilation over the Amplatz wire, a 41 French peel-away sheath was placed in the gallbladder lumen with the platinum plus wire is a safety wire into the duodenum. The discover spyglass cholangioscope was then inserted through the 20 French sheath and into the gallbladder lumen. Irrigation was performed with normal saline. There were no readily visible gallstones. The gallbladder mucosa appeared pale and healthy. Injection of contrast through the sheath demonstrated spillage of contrast into the peritoneal space as well as into the common bile duct. Additional interrogation with cholangioscope demonstrated normal appearing mucosa again, without evidence of gallstones. Additional contrast injection via the indwelling sheath demonstrated near complete accordion like collapse of the gallbladder. Given inability to definitively confirm presence within the gallbladder lumen, decision was made to place an internal external biliary drain through the established access to allow appropriate re-expansion of the gallbladder. Therefore, the indwelling V 18 wire was exchanged over a Navicross catheter for an Amplatz wire. A 14 [place] biliary drain was then attempted to be placed, however was unable to be advanced past the level of the cystic and hepatic duct confluences. Additional attempt at placing a 12 [place] biliary drain was met with similar resistance. Therefore, 5 mm x 4 cm mustang balloon was used to provide cholangio plasty throughout the common bile duct. Additional attempt at placing a 14 French drain was unsuccessful with resistance at similar location.  Therefore, a 2 [place] biliary drain was then attempted which was able to be advanced into the duodenum. The pigtail portion was coiled in the catheter was retracted until the radiopaque band was in the expected location of the gallbladder lumen. The drain was placed to bag drainage and affixed to the skin with an interrupted 0 silk suture. A sterile bandage was applied. The patient tolerated the procedure well was transferred to the recovery area in good condition. IMPRESSION: Unsuccessful attempt at percutaneous gallstone retrieval due to collapse of the under distended gallbladder upon insertion of 20 French peel-away sheath. Due to inability to definitively replace cholecystostomy tube within the gallbladder lumen, an internal external biliary drain was inserted with the radiopaque band in the expected location of the  gallbladder. PLAN: Keep biliary drainage catheter placed to bag drainage. Interventional Radiology will arrange for repeat cholangiogram and possible percutaneous gallstone retrieval in 2-3 weeks. Ruthann Cancer, MD [facility] Radiology Electronically Signed   By: Ruthann Cancer M.D.   On: 05/04/2022 15:41   IR BALLOON DILATION OF BILIARY DUCTS/AMPULLA  Result Date: 05/04/2022 INDICATION: 77 year old male with history of calculus cholecystitis status post percutaneous cholecystostomy tube placement on 01/21/2022 presenting for percutaneous gallstone retrieval. EXAM: 1. Percutaneous cholangiogram. 2. Cholangioplasty. 3. Conversion of cholecystostomy tube to internal external biliary drain. MEDICATIONS: 2 g cefoxitin, intravenous; The antibiotic was administered within an appropriate time frame prior to the initiation of the procedure. ANESTHESIA/SEDATION: Moderate (conscious) sedation was employed during this procedure. A total of Versed 4 mg and Fentanyl 200 mcg was administered intravenously. Moderate Sedation Time: 66 minutes. The patient's level  of consciousness and vital signs were monitored continuously by radiology nursing throughout the procedure under my direct supervision. FLUOROSCOPY TIME:  Two hundred eighty-two mGy COMPLICATIONS: None immediate. PROCEDURE: Informed written consent was obtained from the patient after a thorough discussion of the procedural risks, benefits and alternatives. All questions were addressed. Maximal Sterile Barrier Technique was utilized including caps, mask, sterile gowns, sterile gloves, sterile drape, hand hygiene and skin antiseptic. A timeout was performed prior to the initiation of the procedure. The indwelling cholecystostomy tube in right upper quadrant prepped and draped in standard fashion. Preprocedure scout radiograph demonstrated unchanged position with the drain pigtail near the expected location of the gallbladder. Subcutaneous local anesthetic was administered with 1% lidocaine. Additional for local anesthetic was provided along the catheter tract to the capsule of the liver. Hand injection of contrast demonstrated multifocal rounded filling defects within a relatively decompressed gallbladder. The cystic duct is widely patent. There is brisk antegrade flow of contrast into the nondilated common bile duct which flowed easily into the duodenum. The indwelling catheter was cut to release the pigtail and an Amplatz wire was inserted and coiled in the gallbladder lumen. The drain was removed and exchanged for a 12 [place], angled braided sheath. Through the indwelling sheath, a 4 French angled tip Navicross catheter was inserted and directed toward the infundibulum and into the cystic duct. Wire cannulation with a 0.08 inch glidewire was successful with a wire passing into the distal duodenum. The Navicross catheter was then directed into the duodenum and the wire was removed. Injection of contrast demonstrated opacification of the duodenum. A 0.018 inch platinum plus wire was then inserted and the catheter was  removed. The Amplatz wire was placed alongside the platinum plus through the indwelling 12 French sheath and coiled in the gallbladder lumen. The sheath was then removed and after serial dilation over the Amplatz wire, a 70 French peel-away sheath was placed in the gallbladder lumen with the platinum plus wire is a safety wire into the duodenum. The discover spyglass cholangioscope was then inserted through the 20 French sheath and into the gallbladder lumen. Irrigation was performed with normal saline. There were no readily visible gallstones. The gallbladder mucosa appeared pale and healthy. Injection of contrast through the sheath demonstrated spillage of contrast into the peritoneal space as well as into the common bile duct. Additional interrogation with cholangioscope demonstrated normal appearing mucosa again, without evidence of gallstones. Additional contrast injection via the indwelling sheath demonstrated near complete accordion like collapse of the gallbladder. Given inability to definitively confirm presence within the gallbladder lumen, decision was made to place an internal external  biliary drain through the established access to allow appropriate re-expansion of the gallbladder. Therefore, the indwelling V 18 wire was exchanged over a Navicross catheter for an Amplatz wire. A 14 [place] biliary drain was then attempted to be placed, however was unable to be advanced past the level of the cystic and hepatic duct confluences. Additional attempt at placing a 12 [place] biliary drain was met with similar resistance. Therefore, 5 mm x 4 cm mustang balloon was used to provide cholangio plasty throughout the common bile duct. Additional attempt at placing a 14 French drain was unsuccessful with resistance at similar location. Therefore, a 81 [place] biliary drain was then attempted which was able to be advanced into the duodenum. The pigtail portion was coiled in the catheter was retracted until the radiopaque  band was in the expected location of the gallbladder lumen. The drain was placed to bag drainage and affixed to the skin with an interrupted 0 silk suture. A sterile bandage was applied. The patient tolerated the procedure well was transferred to the recovery area in good condition. IMPRESSION: Unsuccessful attempt at percutaneous gallstone retrieval due to collapse of the under distended gallbladder upon insertion of 20 French peel-away sheath. Due to inability to definitively replace cholecystostomy tube within the gallbladder lumen, an internal external biliary drain was inserted with the radiopaque band in the expected location of the gallbladder. PLAN: Keep biliary drainage catheter placed to bag drainage. Interventional Radiology will arrange for repeat cholangiogram and possible percutaneous gallstone retrieval in 2-3 weeks. Ruthann Cancer, MD [facility] Radiology Electronically Signed   By: Ruthann Cancer M.D.   On: 05/04/2022 15:41   IR CONVERT BILIARY DRAIN TO INT EXT BILIARY DRAIN  Result Date: 05/04/2022 INDICATION: 77 year old male with history of calculus cholecystitis status post percutaneous cholecystostomy tube placement on 01/21/2022 presenting for percutaneous gallstone retrieval. EXAM: 1. Percutaneous cholangiogram. 2. Cholangioplasty. 3. Conversion of cholecystostomy tube to internal external biliary drain. MEDICATIONS: 2 g cefoxitin, intravenous; The antibiotic was administered within an appropriate time frame prior to the initiation of the procedure. ANESTHESIA/SEDATION: Moderate (conscious) sedation was employed during this procedure. A total of Versed 4 mg and Fentanyl 200 mcg was administered intravenously. Moderate Sedation Time: 66 minutes. The patient's level of consciousness and vital signs were monitored continuously by radiology nursing throughout the procedure under my direct supervision. FLUOROSCOPY TIME:  Two hundred eighty-two mGy  COMPLICATIONS: None immediate. PROCEDURE: Informed written consent was obtained from the patient after a thorough discussion of the procedural risks, benefits and alternatives. All questions were addressed. Maximal Sterile Barrier Technique was utilized including caps, mask, sterile gowns, sterile gloves, sterile drape, hand hygiene and skin antiseptic. A timeout was performed prior to the initiation of the procedure. The indwelling cholecystostomy tube in right upper quadrant prepped and draped in standard fashion. Preprocedure scout radiograph demonstrated unchanged position with the drain pigtail near the expected location of the gallbladder. Subcutaneous local anesthetic was administered with 1% lidocaine. Additional for local anesthetic was provided along the catheter tract to the capsule of the liver. Hand injection of contrast demonstrated multifocal rounded filling defects within a relatively decompressed gallbladder. The cystic duct is widely patent. There is brisk antegrade flow of contrast into the nondilated common bile duct which flowed easily into the duodenum. The indwelling catheter was cut to release the pigtail and an Amplatz wire was inserted and coiled in the gallbladder lumen. The drain was removed and exchanged for a 12 [place], angled braided sheath. Through  the indwelling sheath, a 4 French angled tip Navicross catheter was inserted and directed toward the infundibulum and into the cystic duct. Wire cannulation with a 0.08 inch glidewire was successful with a wire passing into the distal duodenum. The Navicross catheter was then directed into the duodenum and the wire was removed. Injection of contrast demonstrated opacification of the duodenum. A 0.018 inch platinum plus wire was then inserted and the catheter was removed. The Amplatz wire was placed alongside the platinum plus through the indwelling 12 French sheath and coiled in the gallbladder lumen. The sheath was then removed and after  serial dilation over the Amplatz wire, a 54 French peel-away sheath was placed in the gallbladder lumen with the platinum plus wire is a safety wire into the duodenum. The discover spyglass cholangioscope was then inserted through the 20 French sheath and into the gallbladder lumen. Irrigation was performed with normal saline. There were no readily visible gallstones. The gallbladder mucosa appeared pale and healthy. Injection of contrast through the sheath demonstrated spillage of contrast into the peritoneal space as well as into the common bile duct. Additional interrogation with cholangioscope demonstrated normal appearing mucosa again, without evidence of gallstones. Additional contrast injection via the indwelling sheath demonstrated near complete accordion like collapse of the gallbladder. Given inability to definitively confirm presence within the gallbladder lumen, decision was made to place an internal external biliary drain through the established access to allow appropriate re-expansion of the gallbladder. Therefore, the indwelling V 18 wire was exchanged over a Navicross catheter for an Amplatz wire. A 14 [place] biliary drain was then attempted to be placed, however was unable to be advanced past the level of the cystic and hepatic duct confluences. Additional attempt at placing a 12 [place] biliary drain was met with similar resistance. Therefore, 5 mm x 4 cm mustang balloon was used to provide cholangio plasty throughout the common bile duct. Additional attempt at placing a 14 French drain was unsuccessful with resistance at similar location. Therefore, a 21 [place] biliary drain was then attempted which was able to be advanced into the duodenum. The pigtail portion was coiled in the catheter was retracted until the radiopaque band was in the expected location of the gallbladder lumen. The drain was placed to bag drainage and affixed to the skin with an interrupted 0 silk suture. A sterile bandage was  applied. The patient tolerated the procedure well was transferred to the recovery area in good condition. IMPRESSION: Unsuccessful attempt at percutaneous gallstone retrieval due to collapse of the under distended gallbladder upon insertion of 20 French peel-away sheath. Due to inability to definitively replace cholecystostomy tube within the gallbladder lumen, an internal external biliary drain was inserted with the radiopaque band in the expected location of the gallbladder. PLAN: Keep biliary drainage catheter placed to bag drainage. Interventional Radiology will arrange for repeat cholangiogram and possible percutaneous gallstone retrieval in 2-3 weeks. Ruthann Cancer, MD [facility] Radiology Electronically Signed   By: Ruthann Cancer M.D.   On: 05/04/2022 15:41     ASSESSMENT AND PLAN: This is a very pleasant 77 years old white male with a stage IIIb non-small cell lung cancer, squamous cell carcinoma diagnosed in August of 2021.  The patient also has a history of stage 0 CLL.  He underwent a course of concurrent chemoradiation with weekly carboplatin for AUC of 2 and paclitaxel 45 mg/M2 status post 5 cycles of the treatment.  He tolerated this treatment well with no  concerning adverse effect except for mild odynophagia and dysphagia as well as thrombocytopenia. His scan showed improvement of his disease with decrease in the size of the left upper lobe lung mass as well as decrease in the mediastinal lymphadenopathy and no evidence of metastatic disease. The patient completed consolidation treatment with immunotherapy with Imfinzi 1500 mg IV every 4 weeks status post 13 cycles. His last CT scan of the chest showed the left upper lobe postradiation changes with associated areas of increased soft tissue concerning for evolving postradiation changes or recurrent disease and PET scan was recommended for further evaluation.  He also had slightly increased size of  subcarinal lymph node.  The patient had a PET scan performed on January 15, 2022 and that showed a stable appearance of the masslike architecture and fibrosis with volume loss involving the left upper lobe and left upper hilar region with low-level tracer uptake with no signs of tracer avid tumor recurrence or metastatic disease.  The patient is currently on observation and he is feeling fine with no concerning issues. He had repeat CT scan of the chest performed recently.  I personally and independently reviewed the scan and discussed the result with the patient today. His scan showed no concerning findings for disease recurrence or metastasis but it showed acute or subacute pancreatitis with inflammatory phlegmon surrounding the pancreas without discrete well-formed pseudocyst.  The patient also has cholelithiasis with the biliary drainage catheter in place.  He did contact the interventional radiologist and they assured him of this normal finding on the scan after the procedure.  The patient is currently asymptomatic for the possibility of acute pancreatitis. I recommended for him to continue on observation with repeat CT scan of the chest in 3 months. Regarding the leukocytosis and thrombocytosis, the patient has a history of CLL and will continue to monitor him for his myeloproliferative disorder closely.  I may consider him for phlebotomy if he continues to have persistent thrombocytosis. Regarding the gallstones and cholecystitis, he is followed by interventional radiology and has a biliary drainage in place. The patient was advised to call immediately if he has any other concerning symptoms in the interval.  The patient voices understanding of current disease status and treatment options and is in agreement with the current care plan. The total time spent in the appointment was 30 minutes.  All questions were answered. The patient knows to call the clinic with any problems, questions or concerns.  We can certainly see the patient much sooner if necessary.  Disclaimer: This note was dictated with voice recognition software. Similar sounding words can inadvertently be transcribed and may not be corrected upon review.

## 2022-05-26 ENCOUNTER — Other Ambulatory Visit: Payer: Self-pay | Admitting: Internal Medicine

## 2022-05-26 DIAGNOSIS — K810 Acute cholecystitis: Secondary | ICD-10-CM

## 2022-05-27 ENCOUNTER — Other Ambulatory Visit (HOSPITAL_COMMUNITY): Payer: Self-pay | Admitting: Interventional Radiology

## 2022-05-27 ENCOUNTER — Encounter (HOSPITAL_COMMUNITY): Payer: Self-pay

## 2022-05-27 ENCOUNTER — Ambulatory Visit (HOSPITAL_COMMUNITY)
Admission: RE | Admit: 2022-05-27 | Discharge: 2022-05-27 | Disposition: A | Discharge status: Home / Self Care | Payer: Medicare Other | Source: Ambulatory Visit | Attending: Interventional Radiology | Admitting: Interventional Radiology

## 2022-05-27 ENCOUNTER — Other Ambulatory Visit: Payer: Self-pay

## 2022-05-27 DIAGNOSIS — K219 Gastro-esophageal reflux disease without esophagitis: Secondary | ICD-10-CM | POA: Diagnosis not present

## 2022-05-27 DIAGNOSIS — G4733 Obstructive sleep apnea (adult) (pediatric): Secondary | ICD-10-CM | POA: Diagnosis not present

## 2022-05-27 DIAGNOSIS — K8000 Calculus of gallbladder with acute cholecystitis without obstruction: Secondary | ICD-10-CM | POA: Diagnosis not present

## 2022-05-27 DIAGNOSIS — Z7901 Long term (current) use of anticoagulants: Secondary | ICD-10-CM | POA: Diagnosis not present

## 2022-05-27 DIAGNOSIS — J449 Chronic obstructive pulmonary disease, unspecified: Secondary | ICD-10-CM | POA: Diagnosis not present

## 2022-05-27 DIAGNOSIS — K810 Acute cholecystitis: Secondary | ICD-10-CM

## 2022-05-27 DIAGNOSIS — K8060 Calculus of gallbladder and bile duct with cholecystitis, unspecified, without obstruction: Secondary | ICD-10-CM | POA: Diagnosis not present

## 2022-05-27 DIAGNOSIS — Z87891 Personal history of nicotine dependence: Secondary | ICD-10-CM | POA: Insufficient documentation

## 2022-05-27 DIAGNOSIS — E785 Hyperlipidemia, unspecified: Secondary | ICD-10-CM | POA: Insufficient documentation

## 2022-05-27 DIAGNOSIS — I480 Paroxysmal atrial fibrillation: Secondary | ICD-10-CM | POA: Insufficient documentation

## 2022-05-27 DIAGNOSIS — I10 Essential (primary) hypertension: Secondary | ICD-10-CM | POA: Diagnosis not present

## 2022-05-27 DIAGNOSIS — Z434 Encounter for attention to other artificial openings of digestive tract: Secondary | ICD-10-CM | POA: Diagnosis not present

## 2022-05-27 HISTORY — PX: IR CONVERT BILIARY DRAIN TO INT EXT BILIARY DRAIN: IMG6045

## 2022-05-27 LAB — CBC WITH DIFFERENTIAL/PLATELET
Abs Immature Granulocytes: 0.05 10*3/uL (ref 0.00–0.07)
Basophils Absolute: 0.1 10*3/uL (ref 0.0–0.1)
Basophils Relative: 1 %
Eosinophils Absolute: 0.2 10*3/uL (ref 0.0–0.5)
Eosinophils Relative: 3 %
HCT: 40.1 % (ref 39.0–52.0)
Hemoglobin: 12.9 g/dL — ABNORMAL LOW (ref 13.0–17.0)
Immature Granulocytes: 1 %
Lymphocytes Relative: 17 %
Lymphs Abs: 1.5 10*3/uL (ref 0.7–4.0)
MCH: 29.1 pg (ref 26.0–34.0)
MCHC: 32.2 g/dL (ref 30.0–36.0)
MCV: 90.5 fL (ref 80.0–100.0)
Monocytes Absolute: 0.9 10*3/uL (ref 0.1–1.0)
Monocytes Relative: 9 %
Neutro Abs: 6.5 10*3/uL (ref 1.7–7.7)
Neutrophils Relative %: 69 %
Platelets: 235 10*3/uL (ref 150–400)
RBC: 4.43 MIL/uL (ref 4.22–5.81)
RDW: 13.3 % (ref 11.5–15.5)
WBC: 9.2 10*3/uL (ref 4.0–10.5)
nRBC: 0 % (ref 0.0–0.2)

## 2022-05-27 LAB — COMPREHENSIVE METABOLIC PANEL
ALT: 15 U/L (ref 0–44)
AST: 20 U/L (ref 15–41)
Albumin: 3 g/dL — ABNORMAL LOW (ref 3.5–5.0)
Alkaline Phosphatase: 66 U/L (ref 38–126)
Anion gap: 9 (ref 5–15)
BUN: 7 mg/dL — ABNORMAL LOW (ref 8–23)
CO2: 28 mmol/L (ref 22–32)
Calcium: 9.5 mg/dL (ref 8.9–10.3)
Chloride: 102 mmol/L (ref 98–111)
Creatinine, Ser: 0.86 mg/dL (ref 0.61–1.24)
GFR, Estimated: >60 mL/min (ref >60)
Glucose, Bld: 92 mg/dL (ref 70–99)
Potassium: 3.9 mmol/L (ref 3.5–5.1)
Sodium: 139 mmol/L (ref 135–145)
Total Bilirubin: 1 mg/dL (ref 0.3–1.2)
Total Protein: 6.3 g/dL — ABNORMAL LOW (ref 6.5–8.1)

## 2022-05-27 LAB — AMYLASE: Amylase: 101 U/L — ABNORMAL HIGH (ref 28–100)

## 2022-05-27 LAB — LIPASE, BLOOD: Lipase: 113 U/L — ABNORMAL HIGH (ref 11–51)

## 2022-05-27 MED ORDER — MIDAZOLAM HCL 2 MG/2ML IJ SOLN
INTRAMUSCULAR | Status: AC
  Filled 2022-05-27: qty 2

## 2022-05-27 MED ORDER — MIDAZOLAM HCL 2 MG/2ML IJ SOLN
INTRAMUSCULAR | Status: AC | PRN
  Administered 2022-05-27: 1 mg via INTRAVENOUS

## 2022-05-27 MED ORDER — SODIUM CHLORIDE 0.9 % IV SOLN
INTRAVENOUS | Status: AC
  Administered 2022-05-27: 2 g via INTRAVENOUS
  Filled 2022-05-27: qty 20

## 2022-05-27 MED ORDER — SODIUM CHLORIDE 0.9 % IV SOLN
INTRAVENOUS | Status: DC
  Administered 2022-05-27: 250 mL via INTRAVENOUS

## 2022-05-27 MED ORDER — FENTANYL CITRATE (PF) 100 MCG/2ML IJ SOLN
INTRAMUSCULAR | Status: AC
  Filled 2022-05-27: qty 2

## 2022-05-27 MED ORDER — IOHEXOL 300 MG/ML  SOLN
50.0000 mL | Freq: Once | INTRAMUSCULAR | Status: AC | PRN
  Administered 2022-05-27: 25 mL

## 2022-05-27 MED ORDER — SODIUM CHLORIDE 0.9 % IV SOLN: 2.0000 g | INTRAVENOUS | Status: AC

## 2022-05-27 MED ORDER — LIDOCAINE-EPINEPHRINE 1 %-1:100000 IJ SOLN
INTRAMUSCULAR | Status: AC
  Administered 2022-05-27: 10 mL
  Filled 2022-05-27: qty 1

## 2022-05-27 MED ORDER — FENTANYL CITRATE (PF) 100 MCG/2ML IJ SOLN
INTRAMUSCULAR | Status: AC | PRN
  Administered 2022-05-27: 50 ug via INTRAVENOUS

## 2022-05-27 NOTE — H&P (Signed)
Chief Complaint: Patient was seen in consultation today for calculous cholecystitis at the request of Joe [name]  Referring Physician(s): [place] Physician: Ruthann Cancer  Patient Status: [facility] - Out-pt  History of Present Illness: Joe [name] is a 77 y.o. male well-known to IR service after having cholecystostomy drain placed 01/21/22 for acute cholecystitis after he was deemed a poor surgical candidate. He underwent chole drain exchange 02/02/22 and 03/19/22 for complaint of leakage around insertion site. An unsuccessful Spyglass-assisted percutaneous gallstone retrieval procedure 05/04/22 resulted in placement of internal/external biliary drain due to gallbladder collapse. [name] consulted with Joe. Serafina [name] by telephone 05/17/22 for treatment options. Instead of attempting another gallstone retrieval, the patient decided at that time to proceed with conversion of internal/external drain back to cholecystostomy drain with moderate sedation.   Pt denies chills, fever, loss of appetite, SOB, CP, N/V, dizziness, HA or weakness.  He endorses epigastric pressure and fatigue.  He is NPO per order.  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   PERIORBITAL CELLULITIS 02/22/2009   Qualifier: Diagnosis of  By: Joe Browner MD, Amy     Retinal detachment    L>>R    Past Surgical History:  Procedure Laterality Date   BICEPS TENDON REPAIR     BRONCHIAL BIOPSY  10/23/2019   Procedure: BRONCHIAL BIOPSIES;  Surgeon: Joe Gobble, MD;  Location: [facility] ENDOSCOPY;  Service: Pulmonary;;   BRONCHIAL BIOPSY  11/20/2019   Procedure: BRONCHIAL BIOPSIES;  Surgeon: Joe Gobble, MD;  Location: [facility] ENDOSCOPY;  Service: Pulmonary;;   BRONCHIAL BRUSHINGS  10/23/2019   Procedure: BRONCHIAL BRUSHINGS;  Surgeon: Joe Gobble, MD;  Location: [facility] ENDOSCOPY;  Service: Pulmonary;;   BRONCHIAL BRUSHINGS  11/20/2019   Procedure: BRONCHIAL BRUSHINGS;  Surgeon: Joe Gobble, MD;  Location: [facility] ENDOSCOPY;  Service: Pulmonary;;   BRONCHIAL NEEDLE ASPIRATION BIOPSY  10/23/2019  Procedure: BRONCHIAL NEEDLE ASPIRATION BIOPSIES;  Surgeon: Joe Gobble, MD;  Location: [facility] ENDOSCOPY;  Service: Pulmonary;;   BRONCHIAL NEEDLE ASPIRATION BIOPSY  11/20/2019   Procedure: BRONCHIAL NEEDLE ASPIRATION BIOPSIES;  Surgeon: Joe Gobble, MD;  Location: [place];  Service: Pulmonary;;   BRONCHIAL WASHINGS  11/20/2019   Procedure: BRONCHIAL WASHINGS;  Surgeon: Joe Gobble, MD;  Location: [place];  Service: Pulmonary;;   CATARACT EXTRACTION Left    COLONOSCOPY  04-14-99   Joe Joe [name] polyp-TA in epic   IR Joe [name] DUCTS/AMPULLA  05/04/2022   IR CONVERT BILIARY DRAIN TO INT EXT BILIARY DRAIN  05/04/2022   IR EXCHANGE BILIARY DRAIN  02/02/2022   IR EXCHANGE BILIARY DRAIN  03/19/2022   IR PERC CHOLECYSTOSTOMY  01/21/2022   IR RADIOLOGIST EVAL & MGMT  04/08/2022   IR RADIOLOGIST EVAL & MGMT  05/17/2022   IR REMOVAL OF CALCULI/DEBRIS BILIARY DUCT/GB  05/04/2022   POLYPECTOMY  04-14-99   POSTERIOR LAMINECTOMY / DECOMPRESSION CERVICAL SPINE     Joe [name]   RETINAL DETACHMENT SURGERY     left eye, 2007 x2, 2008 x 3   ROTATOR CUFF REPAIR  2004   right   TONSILLECTOMY  Joe [name]:[id number]   VIDEO BRONCHOSCOPY WITH ENDOBRONCHIAL NAVIGATION N/A 10/23/2019   Procedure: VIDEO BRONCHOSCOPY WITH ENDOBRONCHIAL NAVIGATION;  Surgeon: Joe Gobble, MD;  Location: [place] ENDOSCOPY;  Service: Pulmonary;  Laterality: N/A;   VIDEO BRONCHOSCOPY WITH ENDOBRONCHIAL NAVIGATION N/A 11/20/2019   Procedure: VIDEO BRONCHOSCOPY WITH ENDOBRONCHIAL NAVIGATION;  Surgeon: Joe Gobble, MD;  Location: [place] ENDOSCOPY;  Service: Pulmonary;  Laterality: N/A;   VIDEO BRONCHOSCOPY WITH ENDOBRONCHIAL ULTRASOUND N/A 10/23/2019   Procedure: VIDEO BRONCHOSCOPY WITH ENDOBRONCHIAL ULTRASOUND;  Surgeon: Joe Gobble, MD;  Location: [facility] ENDOSCOPY;  Service: Pulmonary;  Laterality: N/A;    Allergies: Patient has no known allergies.  Medications: Prior to Admission medications   Medication Sig Start Date End Date Taking? Authorizing Provider  albuterol (PROVENTIL) (2.5 MG/3ML) 0.083% nebulizer solution Take 3 mLs (2.5 mg total) by nebulization every 6 (six) hours as needed for wheezing or shortness of breath. 03/04/21   Maryjane Hurter, MD  amLODipine (NORVASC) 5 MG tablet TAKE 1 TABLET BY MOUTH EVERY DAY 05/17/22   [name], [name], MD  apixaban (ELIQUIS) 5 MG TABS tablet TAKE 1 TABLET(5 MG) BY MOUTH TWICE DAILY Patient taking differently: Take 5 mg by mouth 2 (two) times daily. 09/25/21   Sueanne Margarita, MD  atenolol (TENORMIN) 25 MG tablet Take 1  tablet (25 mg total) by mouth daily as needed (palpitations). 07/29/21   [name], [name], MD  Cholecalciferol 25 MCG (1000 UT) tablet Take 1,000 Units by mouth daily.    [provider]  clotrimazole-betamethasone (LOTRISONE) cream Apply topically 2 (two) times daily. Patient not taking: Reported on 04/16/2022 12/24/20   [name], [name], MD  diazepam (VALIUM) 5 MG tablet Take 1 tablet (5 mg total) by mouth every 12 (twelve) hours as needed for anxiety. Patient taking differently: Take 5 mg by mouth every 12 (twelve) hours as needed for anxiety (sleep). 07/29/21   [name], [name], MD  levothyroxine (SYNTHROID) 137 MCG tablet Take 1 tablet (137 mcg total) by mouth daily before breakfast. 05/18/22   Heilingoetter, Cassandra L, PA-C  lovastatin (MEVACOR) 20 MG tablet TAKE 1 TABLET BY MOUTH EVERY NIGHT AT BEDTIME 05/10/22   [name], [name], MD  Multiple Vitamin (MULTIVITAMIN) tablet Take 1 tablet by mouth daily. Centrum Silver.  [provider]  Polyethyl Glycol-Propyl Glycol (SYSTANE ULTRA OP) Place 1 drop into both eyes 2 (two) times daily as needed (dry eyes).    [provider]  Sodium Chloride Flush (NORMAL SALINE FLUSH) 0.9 % SOLN Flush gallbladder drain with 5 - 10 ml once daily 04/05/22   Tyson Alias, NP     Family History  Problem Relation Age of Onset   COPD Mother    Diabetes Father    Coronary artery disease Other    Breast cancer Paternal Aunt 21   Colon cancer Neg Hx     Social History   Socioeconomic History   Marital status: Widowed    Spouse name: Not on file   Number of children: 1   Years of education: Not on file   Highest education level: Not on file  Occupational History   Occupation: retired  Tobacco Use   Smoking status: Former    Packs/day: 0.80    Years: 50.00    Total pack years: 40.00    Types: Cigarettes    Quit date: 08/27/2012    Years since quitting: 9.7   Smokeless tobacco: Never  Vaping Use   Vaping Use:  Never used  Substance and Sexual Activity   Alcohol use: Not Currently   Drug use: No   Sexual activity: Not Currently  Other Topics Concern   Not on file  Social History Narrative   Not on file   Social Determinants of Health   Financial Resource Strain: Low Risk  (09/23/2021)   Overall Financial Resource Strain (CARDIA)    Difficulty of Paying Living Expenses: Not hard at all  Food Insecurity: No Food Insecurity (01/20/2022)   Hunger Vital Sign    Worried About Running Out of Food in the Last Year: Never true    [place] in the Last Year: Never true  Transportation Needs: No Transportation Needs (01/20/2022)   PRAPARE - Hydrologist (Medical): No    Lack of Transportation (Non-Medical): No  Physical Activity: Insufficiently Active (09/23/2021)   Exercise Vital Sign    Days of Exercise per Week: 4 days    Minutes of Exercise per Session: 30 min  Stress: No Stress Concern Present (09/23/2021)   [place]    Feeling of Stress : Not at all  Social Connections: Moderately Integrated (09/23/2021)   Social Connection and Isolation Panel [NHANES]    Frequency of Communication with Friends and Family: More than three times a week    Frequency of Social Gatherings with Friends and Family: Three times a week    Attends Religious Services: 1 to 4 times per year    Active Member of Clubs or Organizations: Yes    Attends Archivist Meetings: More than 4 times per year    Marital Status: Widowed    Review of Systems: A 12 point ROS discussed and pertinent positives are indicated in the HPI above.  All other systems are negative.  Review of Systems  Constitutional:  Positive for fatigue. Negative for appetite change, chills and fever.  Respiratory:  Negative for shortness of breath.   Cardiovascular:  Negative for chest pain and leg swelling.  Gastrointestinal:  Positive for abdominal  pain. Negative for nausea and vomiting.  Neurological:  Negative for dizziness, weakness and headaches.    Vital Signs: BP 125/74   Pulse 70   Temp 98.5 F (36.9 C) (Temporal)  Resp 18   Ht '5\' 11"'$  (1.803 m)   Wt 172 lb (78 kg)   SpO2 (S) 99% Comment: 2l o2   BMI 23.99 kg/m   Code status: Full code    Physical Exam Vitals reviewed.  Constitutional:      General: He is not in acute distress.    Appearance: Normal appearance. He is not ill-appearing.  HENT:     Head: Normocephalic and atraumatic.     Mouth/Throat:     Mouth: Mucous membranes are dry.     Pharynx: Oropharynx is clear.  Eyes:     Extraocular Movements: Extraocular movements intact.     Pupils: Pupils are equal, round, and reactive to light.  Cardiovascular:     Rate and Rhythm: Normal rate and regular rhythm.     Pulses: Normal pulses.     Heart sounds: Normal heart sounds. No murmur heard. Pulmonary:     Effort: Pulmonary effort is normal. No respiratory distress.     Breath sounds: Normal breath sounds.  Abdominal:     General: Bowel sounds are normal. There is no distension.     Palpations: Abdomen is soft.     Tenderness: There is no abdominal tenderness. There is no guarding.  Musculoskeletal:     Right lower leg: No edema.     Left lower leg: No edema.  Skin:    General: Skin is warm and dry.  Neurological:     Mental Status: He is alert and oriented to person, place, and time.  Psychiatric:        Mood and Affect: Mood normal.        Behavior: Behavior normal.        Thought Content: Thought content normal.        Judgment: Judgment normal.     Imaging: CT Chest W Contrast  Result Date: 05/18/2022 CLINICAL DATA:  Non-small cell lung cancer restaging. * Tracking Code: BO * EXAM: CT CHEST WITH CONTRAST TECHNIQUE: Multidetector CT imaging of the chest was performed during intravenous contrast administration. RADIATION DOSE REDUCTION: This exam was performed according to the departmental  dose-optimization program which includes automated exposure control, adjustment of the mA and/or kV according to patient size and/or use of iterative reconstruction technique. CONTRAST:  54m OMNIPAQUE IOHEXOL 300 MG/ML  SOLN COMPARISON:  Chest CT 12/28/2021.  PET-CT 01/15/2022. FINDINGS: Cardiovascular: The heart is normal in size. No pericardial effusion. Stable tortuosity, ectasia and calcification thoracic aorta. 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 This is stable. Recommend annual imaging followup by CTA or MRA. This recommendation follows 2010 ACCF/AHA/AATS/ACR/ASA/SCA/SCAI/SIR/STS/SVM Guidelines for the Diagnosis and Management of Patients with Thoracic Aortic Disease. Circulation. 2010; 121:[id number] Aortic aneurysm NOS (ICD10-I71.9). Normal pulmonary arteries. Mediastinum/Nodes: No mediastinal or hilar mass or lymphadenopathy. The esophagus is unremarkable. 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 No findings suspicious for recurrent tumor. Stable significant underlying emphysematous changes and pulmonary scarring. No new pulmonary lesions or acute overlying pulmonary process. No pulmonary nodules to suggest metastatic disease. Stable 5 mm perifissural nodule in the right upper lobe most consistent with a benign lymph node. Upper Abdomen: The upper abdomen demonstrates a biliary drainage catheter in place. The catheter is in the gallbladder and extends through the cystic duct and into the common bile duct and duodenum. Calcified gallstones surround catheter. There are also changes of acute or subacute pancreatitis with inflammatory phlegmon surrounding  the pancreas without discrete well-formed pseudocysts. The pancreatic body/tail region demonstrates thickening and inflammation but normal overall enhancement of the pancreas. Musculoskeletal: No significant bony findings.  Remote L1 compression fracture. IMPRESSION: 1. 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 No findings suspicious for recurrent tumor. 2. No mediastinal or hilar mass or adenopathy. 3. Stable emphysematous changes and pulmonary scarring. 4. 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 5. Acute or subacute pancreatitis with inflammatory phlegmon surrounding the pancreas without discrete well-formed pseudocysts. 6. Cholelithiasis with a biliary drainage catheter in place. 7. Aortic atherosclerosis. Aortic Atherosclerosis (ICD10-I70.0) and Emphysema (ICD10-J43.9). These results will be called to the ordering clinician or representative by the Radiologist Assistant, and communication documented in the PACS or [employer]. Electronically Signed   By: Marijo Sanes M.D.   On: 05/18/2022 12:37   IR Radiologist Eval & Mgmt  Result Date: 05/17/2022 EXAM: ESTABLISHED PATIENT OFFICE VISIT CHIEF COMPLAINT: See Epic note. HISTORY OF PRESENT ILLNESS: See Epic note. REVIEW OF SYSTEMS: See Epic note. PHYSICAL EXAMINATION: See Epic note. ASSESSMENT AND PLAN: See Epic note. Ruthann Cancer, MD [facility] Radiology Electronically Signed   By: Ruthann Cancer M.D.   On: 05/17/2022 08:36   IR REMOVAL OF CALCULI/DEBRIS BILIARY DUCT/GB  Result Date: 05/04/2022 INDICATION: 77 year old male with history of calculus cholecystitis status post percutaneous cholecystostomy tube placement on 01/21/2022 presenting for percutaneous gallstone retrieval. EXAM: 1. Percutaneous cholangiogram. 2. Cholangioplasty. 3. Conversion of cholecystostomy tube to internal external biliary drain. MEDICATIONS: 2 g cefoxitin, intravenous; The antibiotic was administered within an appropriate time frame prior to the initiation of the procedure. ANESTHESIA/SEDATION: Moderate (conscious) sedation was  employed during this procedure. A total of Versed 4 mg and Fentanyl 200 mcg was administered intravenously. Moderate Sedation Time: 66 minutes. The patient's level of consciousness and vital signs were monitored continuously by radiology nursing throughout the procedure under my direct supervision. FLUOROSCOPY TIME:  Two hundred eighty-two mGy COMPLICATIONS: None immediate. PROCEDURE: Informed written consent was obtained from the patient after a thorough discussion of the procedural risks, benefits and alternatives. All questions were addressed. Maximal Sterile Barrier Technique was utilized including caps, mask, sterile gowns, sterile gloves, sterile drape, hand hygiene and skin antiseptic. A timeout was performed prior to the initiation of the procedure. The indwelling cholecystostomy tube in right upper quadrant prepped and draped in standard fashion. Preprocedure scout radiograph demonstrated unchanged position with the drain pigtail near the expected location of the gallbladder. Subcutaneous local anesthetic was administered with 1% lidocaine. Additional for local anesthetic was provided along the catheter tract to the capsule of the liver. Hand injection of contrast demonstrated multifocal rounded filling defects within a relatively decompressed gallbladder. The cystic duct is widely patent. There is brisk antegrade flow of contrast into the nondilated common bile duct which flowed easily into the duodenum. The indwelling catheter was cut to release the pigtail and an Amplatz wire was inserted and coiled in the gallbladder lumen. The drain was removed and exchanged for a 12 [place], angled braided sheath. Through the indwelling sheath, a 4 French angled tip Navicross catheter was inserted and directed toward the infundibulum and into the cystic duct. Wire cannulation with a 0.08 inch glidewire was successful with a wire passing into the distal duodenum. The Navicross catheter was then directed into the  duodenum and the wire was removed. Injection of contrast demonstrated opacification of the duodenum. A  0.018 inch platinum plus wire was then inserted and the catheter was removed. The Amplatz wire was placed alongside the platinum plus through the indwelling 12 French sheath and coiled in the gallbladder lumen. The sheath was then removed and after serial dilation over the Amplatz wire, a 34 French peel-away sheath was placed in the gallbladder lumen with the platinum plus wire is a safety wire into the duodenum. The discover spyglass cholangioscope was then inserted through the 20 French sheath and into the gallbladder lumen. Irrigation was performed with normal saline. There were no readily visible gallstones. The gallbladder mucosa appeared pale and healthy. Injection of contrast through the sheath demonstrated spillage of contrast into the peritoneal space as well as into the common bile duct. Additional interrogation with cholangioscope demonstrated normal appearing mucosa again, without evidence of gallstones. Additional contrast injection via the indwelling sheath demonstrated near complete accordion like collapse of the gallbladder. Given inability to definitively confirm presence within the gallbladder lumen, decision was made to place an internal external biliary drain through the established access to allow appropriate re-expansion of the gallbladder. Therefore, the indwelling V 18 wire was exchanged over a Navicross catheter for an Amplatz wire. A 14 [place] biliary drain was then attempted to be placed, however was unable to be advanced past the level of the cystic and hepatic duct confluences. Additional attempt at placing a 12 [place] biliary drain was met with similar resistance. Therefore, 5 mm x 4 cm mustang balloon was used to provide cholangio plasty throughout the common bile duct. Additional attempt at placing a 14 French drain was unsuccessful with resistance at similar location. Therefore, a  65 [place] biliary drain was then attempted which was able to be advanced into the duodenum. The pigtail portion was coiled in the catheter was retracted until the radiopaque band was in the expected location of the gallbladder lumen. The drain was placed to bag drainage and affixed to the skin with an interrupted 0 silk suture. A sterile bandage was applied. The patient tolerated the procedure well was transferred to the recovery area in good condition. IMPRESSION: Unsuccessful attempt at percutaneous gallstone retrieval due to collapse of the under distended gallbladder upon insertion of 20 French peel-away sheath. Due to inability to definitively replace cholecystostomy tube within the gallbladder lumen, an internal external biliary drain was inserted with the radiopaque band in the expected location of the gallbladder. PLAN: Keep biliary drainage catheter placed to bag drainage. Interventional Radiology will arrange for repeat cholangiogram and possible percutaneous gallstone retrieval in 2-3 weeks. Ruthann Cancer, MD [facility] Radiology Electronically Signed   By: Ruthann Cancer M.D.   On: 05/04/2022 15:41   IR BALLOON DILATION OF BILIARY DUCTS/AMPULLA  Result Date: 05/04/2022 INDICATION: 77 year old male with history of calculus cholecystitis status post percutaneous cholecystostomy tube placement on 01/21/2022 presenting for percutaneous gallstone retrieval. EXAM: 1. Percutaneous cholangiogram. 2. Cholangioplasty. 3. Conversion of cholecystostomy tube to internal external biliary drain. MEDICATIONS: 2 g cefoxitin, intravenous; The antibiotic was administered within an appropriate time frame prior to the initiation of the procedure. ANESTHESIA/SEDATION: Moderate (conscious) sedation was employed during this procedure. A total of Versed 4 mg and Fentanyl 200 mcg was administered intravenously. Moderate Sedation Time: 66 minutes. The patient's level of  consciousness and vital signs were monitored continuously by radiology nursing throughout the procedure under my direct supervision. FLUOROSCOPY TIME:  Two hundred eighty-two mGy COMPLICATIONS: None immediate. PROCEDURE: Informed written consent was obtained from the patient after a thorough  discussion of the procedural risks, benefits and alternatives. All questions were addressed. Maximal Sterile Barrier Technique was utilized including caps, mask, sterile gowns, sterile gloves, sterile drape, hand hygiene and skin antiseptic. A timeout was performed prior to the initiation of the procedure. The indwelling cholecystostomy tube in right upper quadrant prepped and draped in standard fashion. Preprocedure scout radiograph demonstrated unchanged position with the drain pigtail near the expected location of the gallbladder. Subcutaneous local anesthetic was administered with 1% lidocaine. Additional for local anesthetic was provided along the catheter tract to the capsule of the liver. Hand injection of contrast demonstrated multifocal rounded filling defects within a relatively decompressed gallbladder. The cystic duct is widely patent. There is brisk antegrade flow of contrast into the nondilated common bile duct which flowed easily into the duodenum. The indwelling catheter was cut to release the pigtail and an Amplatz wire was inserted and coiled in the gallbladder lumen. The drain was removed and exchanged for a 12 [place], angled braided sheath. Through the indwelling sheath, a 4 French angled tip Navicross catheter was inserted and directed toward the infundibulum and into the cystic duct. Wire cannulation with a 0.08 inch glidewire was successful with a wire passing into the distal duodenum. The Navicross catheter was then directed into the duodenum and the wire was removed. Injection of contrast demonstrated opacification of the duodenum. A 0.018 inch platinum plus wire was then inserted and the catheter was  removed. The Amplatz wire was placed alongside the platinum plus through the indwelling 12 French sheath and coiled in the gallbladder lumen. The sheath was then removed and after serial dilation over the Amplatz wire, a 37 French peel-away sheath was placed in the gallbladder lumen with the platinum plus wire is a safety wire into the duodenum. The discover spyglass cholangioscope was then inserted through the 20 French sheath and into the gallbladder lumen. Irrigation was performed with normal saline. There were no readily visible gallstones. The gallbladder mucosa appeared pale and healthy. Injection of contrast through the sheath demonstrated spillage of contrast into the peritoneal space as well as into the common bile duct. Additional interrogation with cholangioscope demonstrated normal appearing mucosa again, without evidence of gallstones. Additional contrast injection via the indwelling sheath demonstrated near complete accordion like collapse of the gallbladder. Given inability to definitively confirm presence within the gallbladder lumen, decision was made to place an internal external biliary drain through the established access to allow appropriate re-expansion of the gallbladder. Therefore, the indwelling V 18 wire was exchanged over a Navicross catheter for an Amplatz wire. A 14 [place] biliary drain was then attempted to be placed, however was unable to be advanced past the level of the cystic and hepatic duct confluences. Additional attempt at placing a 12 [place] biliary drain was met with similar resistance. Therefore, 5 mm x 4 cm mustang balloon was used to provide cholangio plasty throughout the common bile duct. Additional attempt at placing a 14 French drain was unsuccessful with resistance at similar location. Therefore, a 46 [place] biliary drain was then attempted which was able to be advanced into the duodenum. The pigtail portion was coiled in the catheter was retracted until the radiopaque  band was in the expected location of the gallbladder lumen. The drain was placed to bag drainage and affixed to the skin with an interrupted 0 silk suture. A sterile bandage was applied. The patient tolerated the procedure well was transferred to the recovery area in good condition. IMPRESSION: Unsuccessful attempt at percutaneous gallstone  retrieval due to collapse of the under distended gallbladder upon insertion of 20 French peel-away sheath. Due to inability to definitively replace cholecystostomy tube within the gallbladder lumen, an internal external biliary drain was inserted with the radiopaque band in the expected location of the gallbladder. PLAN: Keep biliary drainage catheter placed to bag drainage. Interventional Radiology will arrange for repeat cholangiogram and possible percutaneous gallstone retrieval in 2-3 weeks. Ruthann Cancer, MD [facility] Radiology Electronically Signed   By: Ruthann Cancer M.D.   On: 05/04/2022 15:41   IR CONVERT BILIARY DRAIN TO INT EXT BILIARY DRAIN  Result Date: 05/04/2022 INDICATION: 77 year old male with history of calculus cholecystitis status post percutaneous cholecystostomy tube placement on 01/21/2022 presenting for percutaneous gallstone retrieval. EXAM: 1. Percutaneous cholangiogram. 2. Cholangioplasty. 3. Conversion of cholecystostomy tube to internal external biliary drain. MEDICATIONS: 2 g cefoxitin, intravenous; The antibiotic was administered within an appropriate time frame prior to the initiation of the procedure. ANESTHESIA/SEDATION: Moderate (conscious) sedation was employed during this procedure. A total of Versed 4 mg and Fentanyl 200 mcg was administered intravenously. Moderate Sedation Time: 66 minutes. The patient's level of consciousness and vital signs were monitored continuously by radiology nursing throughout the procedure under my direct supervision. FLUOROSCOPY TIME:  Two hundred eighty-two mGy  COMPLICATIONS: None immediate. PROCEDURE: Informed written consent was obtained from the patient after a thorough discussion of the procedural risks, benefits and alternatives. All questions were addressed. Maximal Sterile Barrier Technique was utilized including caps, mask, sterile gowns, sterile gloves, sterile drape, hand hygiene and skin antiseptic. A timeout was performed prior to the initiation of the procedure. The indwelling cholecystostomy tube in right upper quadrant prepped and draped in standard fashion. Preprocedure scout radiograph demonstrated unchanged position with the drain pigtail near the expected location of the gallbladder. Subcutaneous local anesthetic was administered with 1% lidocaine. Additional for local anesthetic was provided along the catheter tract to the capsule of the liver. Hand injection of contrast demonstrated multifocal rounded filling defects within a relatively decompressed gallbladder. The cystic duct is widely patent. There is brisk antegrade flow of contrast into the nondilated common bile duct which flowed easily into the duodenum. The indwelling catheter was cut to release the pigtail and an Amplatz wire was inserted and coiled in the gallbladder lumen. The drain was removed and exchanged for a 12 [place], angled braided sheath. Through the indwelling sheath, a 4 French angled tip Navicross catheter was inserted and directed toward the infundibulum and into the cystic duct. Wire cannulation with a 0.08 inch glidewire was successful with a wire passing into the distal duodenum. The Navicross catheter was then directed into the duodenum and the wire was removed. Injection of contrast demonstrated opacification of the duodenum. A 0.018 inch platinum plus wire was then inserted and the catheter was removed. The Amplatz wire was placed alongside the platinum plus through the indwelling 12 French sheath and coiled in the gallbladder lumen. The sheath was then removed and after  serial dilation over the Amplatz wire, a 1 French peel-away sheath was placed in the gallbladder lumen with the platinum plus wire is a safety wire into the duodenum. The discover spyglass cholangioscope was then inserted through the 20 French sheath and into the gallbladder lumen. Irrigation was performed with normal saline. There were no readily visible gallstones. The gallbladder mucosa appeared pale and healthy. Injection of contrast through the sheath demonstrated spillage of contrast into the peritoneal space as well as into the common bile  duct. Additional interrogation with cholangioscope demonstrated normal appearing mucosa again, without evidence of gallstones. Additional contrast injection via the indwelling sheath demonstrated near complete accordion like collapse of the gallbladder. Given inability to definitively confirm presence within the gallbladder lumen, decision was made to place an internal external biliary drain through the established access to allow appropriate re-expansion of the gallbladder. Therefore, the indwelling V 18 wire was exchanged over a Navicross catheter for an Amplatz wire. A 14 [place] biliary drain was then attempted to be placed, however was unable to be advanced past the level of the cystic and hepatic duct confluences. Additional attempt at placing a 12 [place] biliary drain was met with similar resistance. Therefore, 5 mm x 4 cm mustang balloon was used to provide cholangio plasty throughout the common bile duct. Additional attempt at placing a 14 French drain was unsuccessful with resistance at similar location. Therefore, a 30 [place] biliary drain was then attempted which was able to be advanced into the duodenum. The pigtail portion was coiled in the catheter was retracted until the radiopaque band was in the expected location of the gallbladder lumen. The drain was placed to bag drainage and affixed to the skin with an interrupted 0 silk suture. A sterile bandage was  applied. The patient tolerated the procedure well was transferred to the recovery area in good condition. IMPRESSION: Unsuccessful attempt at percutaneous gallstone retrieval due to collapse of the under distended gallbladder upon insertion of 20 French peel-away sheath. Due to inability to definitively replace cholecystostomy tube within the gallbladder lumen, an internal external biliary drain was inserted with the radiopaque band in the expected location of the gallbladder. PLAN: Keep biliary drainage catheter placed to bag drainage. Interventional Radiology will arrange for repeat cholangiogram and possible percutaneous gallstone retrieval in 2-3 weeks. Ruthann Cancer, MD [facility] Radiology Electronically Signed   By: Ruthann Cancer M.D.   On: 05/04/2022 15:41    Labs:  CBC: Recent Labs    01/24/22 0542 02/01/22 1043 05/04/22 0748 05/17/22 1102  WBC 11.7* 10.2 10.9* 25.1*  HGB 11.7* 13.6 15.0 18.3*  HCT 38.3* 43.1 46.1 54.4*  PLT 225 330.0 268 347    COAGS: Recent Labs    01/20/22 2127 05/04/22 0748  INR 1.3* 1.0    BMP: Recent Labs    01/23/22 0705 01/24/22 0542 02/01/22 1043 05/04/22 0748 05/17/22 1102  NA 135 136 140 140 130*  K 3.6 3.7 4.0 3.8 5.1  CL 99 99 101 102 96*  CO2 31 31 34* 29 27  GLUCOSE 116* 102* 86 117* 92  BUN '8 8 6 '$ 7* 10  CALCIUM 8.2* 8.3* 9.5 9.8 10.2  CREATININE 0.72 0.43* 0.68 0.96 0.97  GFRNONAA >60 >60  --  >60 >60    LIVER FUNCTION TESTS: Recent Labs    01/23/22 0705 02/01/22 1043 05/04/22 0748 05/17/22 1102  BILITOT 1.2 1.0 0.6 0.8  AST '27 23 31 24  '$ ALT 38 '21 22 22  '$ ALKPHOS 90 80 74 124  PROT 5.9* 7.4 7.6 8.8*  ALBUMIN 2.3* 3.6 3.7 4.4    TUMOR MARKERS: No results for input(s): "AFPTM", "CEA", "CA199", "CHROMGRNA" in the last 8760 hours.  Assessment and Plan:  77 yo male with PMHx of calculous cholecystitis, CLL, COPD, GERD, HLD, HTN, OSA on CPAP and PAF presents to IR for  conversion of internal/external biliary drain to cholecystostomy drain.    Pt resting in bed. He is A&O, calm and pleasant.  He  is in no distress.  Risks and benefits of conversion of internal/external biliary type drain to cholecystostomy drain with moderate sedation discussed with the patient including, but not limited to bleeding, infection which may lead to sepsis or even death and damage to adjacent structures.  This interventional procedure involves the use of X-rays and because of the nature of the planned procedure, it is possible that we will have prolonged use of X-ray fluoroscopy.  Potential radiation risks to you include (but are not limited to) the following: - A slightly elevated risk for cancer  several years later in life. This risk is typically less than 0.5% percent. This risk is low in comparison to the normal incidence of human cancer, which is 33% for women and 50% for men according to the [employer]. - Radiation induced injury can include skin redness, resembling a rash, tissue breakdown / ulcers and hair loss (which can be temporary or permanent).   The likelihood of either of these occurring depends on the difficulty of the procedure and whether you are sensitive to radiation due to previous procedures, disease, or genetic conditions.   IF your procedure requires a prolonged use of radiation, you will be notified and given written instructions for further action.  It is your responsibility to monitor the irradiated area for the 2 weeks following the procedure and to notify your physician if you are concerned that you have suffered a radiation induced injury.    All of the patient's questions were answered, patient is agreeable to proceed.  Consent signed and in chart.   Thank you for this interesting consult.  I greatly enjoyed meeting MARKEL WACHHOLZ and look forward to participating in their care.  A copy of this report was sent to the requesting  provider on this date.  Electronically Signed: Tyson Alias, NP 05/27/2022, 10:33 AM   I spent a total of 20 minutes in face to face in clinical consultation, greater than 50% of which was counseling/coordinating care for internal/external biliary drain conversion to cholecystostomy drain.

## 2022-05-27 NOTE — Discharge Instructions (Signed)
FLUSH CATHETER WITH 5 ML NS THREE TIMES DAILY. CHANGE DRESSING IF SOILED. RECORD DATE AND TIME AND AMOUNT OF DRAINAGE.

## 2022-05-27 NOTE — Progress Notes (Signed)
Patient sent home with supplies for dressing change, and flushes. Patient stated he knows how to flush catheter.

## 2022-05-27 NOTE — Procedures (Signed)
Interventional Radiology Procedure Note  Procedure:  1) Cholangiogram 2) Exchange of cholecystostomy drain  Findings: Please refer to procedural dictation for full description. Cholangiogram demonstrates no evidence of choledocholithiasis.  Internal/external drain exchanged for 10 Fr cholecystotomy tube, to bag drainage.  Complications: None immediate  Estimated Blood Loss: < 5 mL  Recommendations: Keep to bag drainage. Plan for 6-8 week drain exchange with me at [facility] per patient preference. CBC, CMP, amylase and lipase obtained today - will review with patient prior to DC and establish plan.   Ruthann Cancer, MD

## 2022-06-18 ENCOUNTER — Encounter (INDEPENDENT_AMBULATORY_CARE_PROVIDER_SITE_OTHER): Payer: Medicare Other | Admitting: Ophthalmology

## 2022-06-18 DIAGNOSIS — H35033 Hypertensive retinopathy, bilateral: Secondary | ICD-10-CM

## 2022-06-18 DIAGNOSIS — H338 Other retinal detachments: Secondary | ICD-10-CM | POA: Diagnosis not present

## 2022-06-18 DIAGNOSIS — H34831 Tributary (branch) retinal vein occlusion, right eye, with macular edema: Secondary | ICD-10-CM

## 2022-06-18 DIAGNOSIS — I10 Essential (primary) hypertension: Secondary | ICD-10-CM

## 2022-06-18 DIAGNOSIS — H43811 Vitreous degeneration, right eye: Secondary | ICD-10-CM | POA: Diagnosis not present

## 2022-06-22 NOTE — Progress Notes (Unsigned)
Synopsis: Referred for organizing pneumonia by [name], [name], MD  Subjective:   PATIENT ID: Joe [name] GENDER: male DOB: August 27, 1945, MRN: [id number]  No chief complaint on file.  10yM with stage IIIb NSCLC, squamous cell carcinoma dx 10/2019 who originally presented with LUL mass and mediastinal LAD, contralateral R hilar hypermetabolism wh ois s/p chemoXRT now on consolidation IT with imfinzi s/p 13 cycles, CLL dx 07-08-2014, COPD, pAF, hypothyroid, GERD, OSA on CPAP.  He says his breathing is ok. Discharged on O2 2L with exertion. O2 with exertion does improve his DOE. He doesn't have near as much cough. Not noticing any issue with worsening dyspnea as dose of prednisone is decreased (currently 30 mg daily since 2 days before visit). Getting home PT.   Was started on trelegy by PCP but he had reservations about taking it - concern over effect on HR, urinary retention.  Interval HPI: Set up for inogen last visit. CT Chest 07/01/21 with new 66mm LLL nodule.   He does use tanks for strenuous exertion, uses inogen for quick trips. Uses tanks outside when it's raining. Coughing which he attributes to postnasal drainage related to runny nose from oxygen use.  ---------------------------- Seen by Dr. Julien Nordmann 3/4 with stable CT Chest surveillance  Had conversion of internal/external biliary drain to c-tube 3/7  Otherwise pertinent review of systems is negative.  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   chronic stage 1- no symtoms   CLL (chronic lymphocytic leukemia) ([place]) 08/08/2014   July 08, 2014 Dr Burr Medico Stage 0   COPD mixed type ([place]) 07/26/2007   Smoker - stopped 6/14    De Quervain's tenosynovitis, right 22-Dec-2013   07/07/2013    Depression    at times   Dysrhythmia    PAF   Dysuria 2013-12-22   9/15 - poss stricture  Urol ref was offered    Gallstones 11/16/2017   Asymptomatic Pt refused surg ref   Generalized anxiety disorder 09/07/2012   Chronic   Potential benefits of a long term steroid  use as well as potential risks  and complications were explained to the patient and were aknowledged.      GERD 12/02/2006   Chronic      Grade I diastolic dysfunction [id number]   Grief 05/23/16   [name] died in Jul 08, 2014   Gynecomastia 2013-12-22   Benign B 07-07-2013    Hyperlipidemia    Hypertension    Hypothyroidism 12/25/2014   07/08/2014 On Levothyroxine    Intertrigo 02/02/2012   11/13    Neoplasm of uncertain behavior of skin 03/12/2013   12/14 R ear, chest    OSA on CPAP    mild with AHI 9/hr and oxygen desats as low as 75%   Paresis ([place])    right- s/p cerv decompression   PERIORBITAL CELLULITIS 02/22/2009   Qualifier: Diagnosis of  By: Diona Browner MD, Amy     Retinal detachment    L>>R     Family History  Problem Relation Age of Onset   COPD Mother    Diabetes Father    Coronary artery disease Other    Breast cancer Paternal Aunt 8   Colon cancer Neg Hx      Past Surgical History:  Procedure Laterality Date   BICEPS TENDON REPAIR  BRONCHIAL BIOPSY  10/23/2019   Procedure: BRONCHIAL BIOPSIES;  Surgeon: [name], MD;  Location: [facility] ENDOSCOPY;  Service: Pulmonary;;   BRONCHIAL BIOPSY  11/20/2019   Procedure: BRONCHIAL BIOPSIES;  Surgeon: [name], MD;  Location: [facility] ENDOSCOPY;  Service: Pulmonary;;   BRONCHIAL BRUSHINGS  10/23/2019   Procedure: BRONCHIAL BRUSHINGS;  Surgeon: [name], MD;  Location: [facility] ENDOSCOPY;  Service: Pulmonary;;   BRONCHIAL BRUSHINGS  11/20/2019   Procedure: BRONCHIAL BRUSHINGS;  Surgeon: [name], MD;  Location: [facility] ENDOSCOPY;  Service: Pulmonary;;   BRONCHIAL NEEDLE ASPIRATION BIOPSY  10/23/2019   Procedure: BRONCHIAL NEEDLE ASPIRATION BIOPSIES;  Surgeon: [name], MD;  Location: [facility] ENDOSCOPY;  Service: Pulmonary;;   BRONCHIAL NEEDLE ASPIRATION BIOPSY   11/20/2019   Procedure: BRONCHIAL NEEDLE ASPIRATION BIOPSIES;  Surgeon: [name], MD;  Location: [facility] ENDOSCOPY;  Service: Pulmonary;;   BRONCHIAL WASHINGS  11/20/2019   Procedure: BRONCHIAL WASHINGS;  Surgeon: [name], MD;  Location: [facility] ENDOSCOPY;  Service: Pulmonary;;   CATARACT EXTRACTION Left    COLONOSCOPY  04-14-99   Dr Flossie Dibble polyp-TA in epic   IR [place]  05/04/2022   IR CONVERT BILIARY DRAIN TO INT EXT BILIARY DRAIN  05/04/2022   IR CONVERT BILIARY DRAIN TO INT EXT BILIARY DRAIN  05/27/2022   IR EXCHANGE BILIARY DRAIN  02/02/2022   IR EXCHANGE BILIARY DRAIN  03/19/2022   IR PERC CHOLECYSTOSTOMY  01/21/2022   IR RADIOLOGIST EVAL & MGMT  04/08/2022   IR RADIOLOGIST EVAL & MGMT  05/17/2022   IR REMOVAL OF CALCULI/DEBRIS BILIARY DUCT/GB  05/04/2022   POLYPECTOMY  04-14-99   POSTERIOR LAMINECTOMY / DECOMPRESSION CERVICAL SPINE     Dr Saintclair Halsted   RETINAL DETACHMENT SURGERY     left eye, 2007 x2, 2008 x 3   ROTATOR CUFF REPAIR  2004   right   TONSILLECTOMY  [id number]   VIDEO BRONCHOSCOPY WITH ENDOBRONCHIAL NAVIGATION N/A 10/23/2019   Procedure: VIDEO BRONCHOSCOPY WITH ENDOBRONCHIAL NAVIGATION;  Surgeon: [name], MD;  Location: [facility] ENDOSCOPY;  Service: Pulmonary;  Laterality: N/A;   VIDEO BRONCHOSCOPY WITH ENDOBRONCHIAL NAVIGATION N/A 11/20/2019   Procedure: VIDEO BRONCHOSCOPY WITH ENDOBRONCHIAL NAVIGATION;  Surgeon: [name], MD;  Location: [facility] ENDOSCOPY;  Service: Pulmonary;  Laterality: N/A;   VIDEO BRONCHOSCOPY WITH ENDOBRONCHIAL ULTRASOUND N/A 10/23/2019   Procedure: VIDEO BRONCHOSCOPY WITH ENDOBRONCHIAL ULTRASOUND;  Surgeon: [name], MD;  Location: [facility] ENDOSCOPY;  Service: Pulmonary;  Laterality: N/A;    Social History   Socioeconomic History   Marital status: Widowed    Spouse name: Not on file   Number of children: 1   Years of education: Not on file   Highest education level: Not on file  Occupational History    Occupation: retired  Tobacco Use   Smoking status: Former    Packs/day: 0.80    Years: 50.00    Additional pack years: 0.00    Total pack years: 40.00    Types: Cigarettes    Quit date: 08/27/2012    Years since quitting: 9.8   Smokeless tobacco: Never  Vaping Use   Vaping Use: Never used  Substance and Sexual Activity   Alcohol use: Not Currently   Drug use: No   Sexual activity: Not Currently  Other Topics Concern   Not on file  Social History Narrative   Not on file   Social Determinants of Health   Financial Resource Strain: Low Risk  (09/23/2021)  Overall Financial Resource Strain (CARDIA)    Difficulty of Paying Living Expenses: Not hard at all  Food Insecurity: No Food Insecurity (01/20/2022)   Hunger Vital Sign    Worried About Running Out of Food in the Last Year: Never true    Ran Out of Food in the Last Year: Never true  Transportation Needs: No Transportation Needs (01/20/2022)   PRAPARE - Hydrologist (Medical): No    Lack of Transportation (Non-Medical): No  Physical Activity: Insufficiently Active (09/23/2021)   Exercise Vital Sign    Days of Exercise per Week: 4 days    Minutes of Exercise per Session: 30 min  Stress: No Stress Concern Present (09/23/2021)   [place]    Feeling of Stress : Not at all  Social Connections: Moderately Integrated (09/23/2021)   Social Connection and Isolation Panel [NHANES]    Frequency of Communication with Friends and Family: More than three times a week    Frequency of Social Gatherings with Friends and Family: Three times a week    Attends Religious Services: 1 to 4 times per year    Active Member of Clubs or Organizations: Yes    Attends Archivist Meetings: More than 4 times per year    Marital Status: Widowed  Intimate Partner Violence: Not At Risk (01/20/2022)   Humiliation, Afraid, Rape, and Kick questionnaire    Fear  of Current or Ex-Partner: No    Emotionally Abused: No    Physically Abused: No    Sexually Abused: No     No Known Allergies   Outpatient Medications Prior to Visit  Medication Sig Dispense Refill   albuterol (PROVENTIL) (2.5 MG/3ML) 0.083% nebulizer solution Take 3 mLs (2.5 mg total) by nebulization every 6 (six) hours as needed for wheezing or shortness of breath. 75 mL 12   amLODipine (NORVASC) 5 MG tablet TAKE 1 TABLET BY MOUTH EVERY DAY 90 tablet 1   apixaban (ELIQUIS) 5 MG TABS tablet TAKE 1 TABLET(5 MG) BY MOUTH TWICE DAILY (Patient taking differently: Take 5 mg by mouth 2 (two) times daily.) 180 tablet 1   atenolol (TENORMIN) 25 MG tablet Take 1 tablet (25 mg total) by mouth daily as needed (palpitations). 90 tablet 0   Cholecalciferol 25 MCG (1000 UT) tablet Take 1,000 Units by mouth daily.     clotrimazole-betamethasone (LOTRISONE) cream Apply topically 2 (two) times daily. (Patient not taking: Reported on 04/16/2022) 45 g 2   diazepam (VALIUM) 5 MG tablet Take 1 tablet (5 mg total) by mouth every 12 (twelve) hours as needed for anxiety. (Patient taking differently: Take 5 mg by mouth every 12 (twelve) hours as needed for anxiety (sleep).) 180 tablet 1   levothyroxine (SYNTHROID) 137 MCG tablet Take 1 tablet (137 mcg total) by mouth daily before breakfast. 30 tablet 2   lovastatin (MEVACOR) 20 MG tablet TAKE 1 TABLET BY MOUTH EVERY NIGHT AT BEDTIME 90 tablet 2   Multiple Vitamin (MULTIVITAMIN) tablet Take 1 tablet by mouth daily. Centrum Silver.     Polyethyl Glycol-Propyl Glycol (SYSTANE ULTRA OP) Place 1 drop into both eyes 2 (two) times daily as needed (dry eyes).     Sodium Chloride Flush (NORMAL SALINE FLUSH) 0.9 % SOLN Flush gallbladder drain with 5 - 10 ml once daily 560 mL 0   No facility-administered medications prior to visit.       Objective:   Physical Exam:  General  appearance: 77 y.o., male, NAD, conversant  Eyes: anicteric sclerae; PERRL, tracking  appropriately HENT: NCAT; MMM Neck: Trachea midline; no lymphadenopathy, no JVD Lungs: Faint expiratory wheeze bl, with normal respiratory effort CV: RRR, no murmur  Abdomen: Soft, non-tender; non-distended, BS present  Extremities: No peripheral edema, warm Skin: Normal turgor and texture; no rash Psych: Appropriate affect Neuro: Alert and oriented to person and place, no focal deficit     There were no vitals filed for this visit.      on 2 LPM  BMI Readings from Last 3 Encounters:  05/27/22 23.99 kg/m  05/24/22 24.11 kg/m  05/04/22 24.55 kg/m   Wt Readings from Last 3 Encounters:  05/27/22 172 lb (78 kg)  05/24/22 172 lb 14.4 oz (78.4 kg)  05/04/22 176 lb (79.8 kg)     CBC    Component Value Date/Time   WBC 9.2 05/27/2022 1012   RBC 4.43 05/27/2022 1012   HGB 12.9 (L) 05/27/2022 1012   HGB 18.3 (H) 05/17/2022 1102   HGB 17.2 (H) 03/10/2017 0911   HCT 40.1 05/27/2022 1012   HCT 52.5 (H) 03/10/2017 0911   PLT 235 05/27/2022 1012   PLT 347 05/17/2022 1102   PLT 241 03/10/2017 0911   MCV 90.5 05/27/2022 1012   MCV 96.7 03/10/2017 0911   MCH 29.1 05/27/2022 1012   MCHC 32.2 05/27/2022 1012   RDW 13.3 05/27/2022 1012   RDW 13.6 03/10/2017 0911   LYMPHSABS 1.5 05/27/2022 1012   LYMPHSABS 9.8 (H) 03/10/2017 0911   MONOABS 0.9 05/27/2022 1012   MONOABS 1.0 (H) 03/10/2017 0911   EOSABS 0.2 05/27/2022 1012   EOSABS 0.3 03/10/2017 0911   BASOSABS 0.1 05/27/2022 1012   BASOSABS 0.1 03/10/2017 0911     Chest Imaging: CTA Chest 02/09/21 reviewed by me with peripheral/subpleural consolidation  CT Chest 03/2021 with decreased burden of peripherap/subpleural reticular/consolidative opacities  CT Chest 06/30/21 reviewed by me with interval development of 17mm LLL nodule  CT Chest 05/17/22 with stable LUL radiation fibrosis, small nodules  Pulmonary Functions Testing Results:    Latest Ref Rng & Units 05/05/2021   11:48 AM 01/02/2020    1:47 PM  PFT Results   FVC-Pre L 2.44  2.61   FVC-Predicted Pre % 55  58   FVC-Post L 2.75  3.13   FVC-Predicted Post % 62  69   Pre FEV1/FVC % % 52  54   Post FEV1/FCV % % 52  53   FEV1-Pre L 1.27  1.41   FEV1-Predicted Pre % 39  43   FEV1-Post L 1.42  1.66   DLCO uncorrected ml/min/mmHg 13.56  16.70   DLCO UNC% % 52  63   DLCO corrected ml/min/mmHg 13.56  16.56   DLCO COR %Predicted % 52  63   DLVA Predicted % 77  84   TLC L 6.02  6.87   TLC % Predicted % 83  94   RV % Predicted % 119  153    Reviewed by me with some decline in lung function since last PFT but not clinically significant  Echocardiogram:   TTE 1. Left ventricular ejection fraction, by estimation, is 60 to 65%. Left  ventricular ejection fraction by 3D volume is 64 %. The left ventricle has  normal function. The left ventricle has no regional wall motion  abnormalities. Left ventricular diastolic   parameters are consistent with Grade I diastolic dysfunction (impaired  relaxation). The average left ventricular global longitudinal strain is  -  23.0 %. The global longitudinal strain is normal.   2. Right ventricular systolic function is normal. The right ventricular  size is normal.   3. The mitral valve is grossly normal. Trivial mitral valve  regurgitation.   4. The aortic valve is tricuspid. Aortic valve regurgitation is mild to  moderate. Aortic valve sclerosis is present, with no evidence of aortic  valve stenosis. Aortic valve mean gradient measures 10.0 mmHg.   5. Aortic dilatation noted. There is borderline dilatation of the aortic  root, measuring 39 mm. There is mild dilatation of the ascending aorta,  measuring 42 mm.   6. The inferior vena cava is normal in size with greater than 50%       Assessment & Plan:   # Organizing pneumonia:  Likely related to imfinzi, would be grade 3 pneumonitis. Resolved.  # COPD gold functional group B  # Chronic hypoxic respiratory failure: 2L with exertion. DME supplier  [employer].  # Stage IIIb NSCLC s/p chemoXRT and imfinzi:  # New 38mm LLL nodule  Plan: - he wishes to wait till October for surveillance CT Chest as already ordered - declines pulmonary rehab, will continue his own home exercises - have encouraged to try trelegy and discussed benefits re: decreased risk of AECOPD and mortality relative to non-triple therapy in advanced COPD, he declines, he prefers to instead use albuterol neb twice daily   RTC 6 months      Maryjane Hurter, MD [facility] 06/22/2022 7:53 AM

## 2022-06-23 ENCOUNTER — Encounter: Payer: Self-pay | Admitting: Student

## 2022-06-23 ENCOUNTER — Ambulatory Visit (INDEPENDENT_AMBULATORY_CARE_PROVIDER_SITE_OTHER): Payer: Medicare Other | Admitting: Student

## 2022-06-23 VITALS — BP 126/70 | HR 69 | Temp 98.1°F | Ht 71.0 in | Wt 179.0 lb

## 2022-06-23 DIAGNOSIS — J9611 Chronic respiratory failure with hypoxia: Secondary | ICD-10-CM

## 2022-06-23 DIAGNOSIS — J449 Chronic obstructive pulmonary disease, unspecified: Secondary | ICD-10-CM

## 2022-06-23 NOTE — Patient Instructions (Addendum)
-   Call us or send my chart message if you'd like to try stiolto or alternative similar inhaler for COPD - See you in 6 months or sooner if need be with Dr. Erin Fulling

## 2022-06-29 ENCOUNTER — Other Ambulatory Visit (HOSPITAL_COMMUNITY): Payer: Self-pay

## 2022-06-29 ENCOUNTER — Other Ambulatory Visit: Payer: Self-pay | Admitting: Radiology

## 2022-06-29 MED ORDER — NORMAL SALINE FLUSH 0.9 % IV SOLN
10.0000 mL | Freq: Every day | INTRAVENOUS | 0 refills | Status: DC
  Filled 2022-06-29: qty 560, 56d supply, fill #0

## 2022-07-05 ENCOUNTER — Telehealth: Payer: Self-pay | Admitting: Cardiology

## 2022-07-05 DIAGNOSIS — I351 Nonrheumatic aortic (valve) insufficiency: Secondary | ICD-10-CM

## 2022-07-05 NOTE — Telephone Encounter (Signed)
Pt states that he is supposed to do an echo in May prior to his appt with Dr. Mayford Knife, however there is no order in. Pt states he has a gallbladder drainage tube and wants to see if it would be ok. He would like a c/b to his cell phone number on file.

## 2022-07-06 ENCOUNTER — Other Ambulatory Visit (HOSPITAL_COMMUNITY): Payer: Self-pay

## 2022-07-06 ENCOUNTER — Ambulatory Visit (INDEPENDENT_AMBULATORY_CARE_PROVIDER_SITE_OTHER): Payer: Medicare Other | Admitting: Internal Medicine

## 2022-07-06 ENCOUNTER — Encounter: Payer: Self-pay | Admitting: Internal Medicine

## 2022-07-06 VITALS — BP 122/60 | HR 60 | Temp 98.4°F | Ht 71.0 in | Wt 178.0 lb

## 2022-07-06 DIAGNOSIS — I4892 Unspecified atrial flutter: Secondary | ICD-10-CM

## 2022-07-06 DIAGNOSIS — M545 Low back pain, unspecified: Secondary | ICD-10-CM | POA: Diagnosis not present

## 2022-07-06 DIAGNOSIS — E039 Hypothyroidism, unspecified: Secondary | ICD-10-CM

## 2022-07-06 DIAGNOSIS — I10 Essential (primary) hypertension: Secondary | ICD-10-CM | POA: Diagnosis not present

## 2022-07-06 DIAGNOSIS — K851 Biliary acute pancreatitis without necrosis or infection: Secondary | ICD-10-CM | POA: Diagnosis not present

## 2022-07-06 LAB — COMPREHENSIVE METABOLIC PANEL
ALT: 15 U/L (ref 0–53)
AST: 21 U/L (ref 0–37)
Albumin: 4 g/dL (ref 3.5–5.2)
Alkaline Phosphatase: 64 U/L (ref 39–117)
BUN: 9 mg/dL (ref 6–23)
CO2: 32 mEq/L (ref 19–32)
Calcium: 9.9 mg/dL (ref 8.4–10.5)
Chloride: 102 mEq/L (ref 96–112)
Creatinine, Ser: 0.88 mg/dL (ref 0.40–1.50)
GFR: 83.63 mL/min (ref >60.00)
Glucose, Bld: 80 mg/dL (ref 70–99)
Potassium: 4.1 mEq/L (ref 3.5–5.1)
Sodium: 140 mEq/L (ref 135–145)
Total Bilirubin: 0.9 mg/dL (ref 0.2–1.2)
Total Protein: 7.3 g/dL (ref 6.0–8.3)

## 2022-07-06 LAB — CBC WITH DIFFERENTIAL/PLATELET
Basophils Absolute: 0.1 10*3/uL (ref 0.0–0.1)
Basophils Relative: 0.7 % (ref 0.0–3.0)
Eosinophils Absolute: 0.4 10*3/uL (ref 0.0–0.7)
Eosinophils Relative: 3.6 % (ref 0.0–5.0)
HCT: 42 % (ref 39.0–52.0)
Hemoglobin: 13.7 g/dL (ref 13.0–17.0)
Lymphocytes Relative: 22.4 % (ref 12.0–46.0)
Lymphs Abs: 2.5 10*3/uL (ref 0.7–4.0)
MCHC: 32.5 g/dL (ref 30.0–36.0)
MCV: 88.5 fl (ref 78.0–100.0)
Monocytes Absolute: 0.9 10*3/uL (ref 0.1–1.0)
Monocytes Relative: 8.3 % (ref 3.0–12.0)
Neutro Abs: 7.3 10*3/uL (ref 1.4–7.7)
Neutrophils Relative %: 65 % (ref 43.0–77.0)
Platelets: 313 10*3/uL (ref 150.0–400.0)
RBC: 4.74 Mil/uL (ref 4.22–5.81)
RDW: 14 % (ref 11.5–15.5)
WBC: 11.2 10*3/uL — ABNORMAL HIGH (ref 4.0–10.5)

## 2022-07-06 LAB — LIPASE: Lipase: 50 U/L (ref 11.0–59.0)

## 2022-07-06 LAB — TSH: TSH: 2.45 u[IU]/mL (ref 0.35–5.50)

## 2022-07-06 LAB — T4, FREE: Free T4: 1.21 ng/dL (ref 0.60–1.60)

## 2022-07-06 MED ORDER — DIAZEPAM 5 MG PO TABS: 5.0000 mg | ORAL_TABLET | Freq: Two times a day (BID) | ORAL | 1 refills | Status: DC | PRN

## 2022-07-06 NOTE — Assessment & Plan Note (Signed)
Abn TSH. Monitor FT3 and FT4 . On Levothroid

## 2022-07-06 NOTE — Progress Notes (Signed)
Subjective:  Patient ID: Joe [name], male    DOB: May 02, 1945  Age: 77 y.o. MRN: [id number]  CC: No chief complaint on file.   HPI Joe [name] presents for lung cancer, gallstones - the drain is in, pancreatitis after his IR procedure: conversion of internal external cholecystostomy drain... to proper cholecystostomy.. F/u COPD, HTN  Outpatient Medications Prior to Visit  Medication Sig Dispense Refill   albuterol (PROVENTIL) (2.5 MG/3ML) 0.083% nebulizer solution Take 3 mLs (2.5 mg total) by nebulization every 6 (six) hours as needed for wheezing or shortness of breath. 75 mL 12   amLODipine (NORVASC) 5 MG tablet TAKE 1 TABLET BY MOUTH EVERY DAY 90 tablet 1   apixaban (ELIQUIS) 5 MG TABS tablet TAKE 1 TABLET(5 MG) BY MOUTH TWICE DAILY (Patient taking differently: Take 5 mg by mouth 2 (two) times daily.) 180 tablet 1   atenolol (TENORMIN) 25 MG tablet Take 1 tablet (25 mg total) by mouth daily as needed (palpitations). 90 tablet 0   Cholecalciferol 25 MCG (1000 UT) tablet Take 1,000 Units by mouth daily.     levothyroxine (SYNTHROID) 137 MCG tablet Take 1 tablet (137 mcg total) by mouth daily before breakfast. 30 tablet 2   lovastatin (MEVACOR) 20 MG tablet TAKE 1 TABLET BY MOUTH EVERY NIGHT AT BEDTIME 90 tablet 2   Multiple Vitamin (MULTIVITAMIN) tablet Take 1 tablet by mouth daily. Centrum Silver.     Polyethyl Glycol-Propyl Glycol (SYSTANE ULTRA OP) Place 1 drop into both eyes 2 (two) times daily as needed (dry eyes).     Sodium Chloride Flush (NORMAL SALINE FLUSH) 0.9 % SOLN Flush gallbladder drain with 5 - 10 ml once daily 560 mL 0   diazepam (VALIUM) 5 MG tablet Take 1 tablet (5 mg total) by mouth every 12 (twelve) hours as needed for anxiety. (Patient taking differently: Take 5 mg by mouth every 12 (twelve) hours as needed for anxiety (sleep).) 180 tablet 1   clotrimazole-betamethasone (LOTRISONE) cream Apply topically 2 (two) times daily. (Patient not taking: Reported on  04/16/2022) 45 g 2   No facility-administered medications prior to visit.    ROS: Review of Systems  Constitutional:  Positive for fatigue. Negative for appetite change and unexpected weight change.  HENT:  Negative for congestion, nosebleeds, sneezing, sore throat and trouble swallowing.   Eyes:  Negative for itching and visual disturbance.  Respiratory:  Negative for cough.   Cardiovascular:  Negative for chest pain, palpitations and leg swelling.  Gastrointestinal:  Positive for abdominal pain. Negative for abdominal distention, blood in stool, diarrhea and nausea.  Genitourinary:  Negative for frequency and hematuria.  Musculoskeletal:  Positive for gait problem. Negative for back pain, joint swelling and neck pain.  Skin:  Negative for rash.  Neurological:  Negative for dizziness, tremors, speech difficulty and weakness.  Psychiatric/Behavioral:  Negative for agitation, dysphoric mood and sleep disturbance. The patient is not nervous/anxious.     Objective:  BP 122/60 (BP Location: Left Arm, Patient Position: Sitting, Cuff Size: Normal)   Pulse 60   Temp 98.4 F (36.9 C) (Oral)   Ht  (1.803 m)   Wt 178 lb (80.7 kg)   SpO2 97%   BMI 24.83 kg/m   BP Readings from Last 3 Encounters:  07/06/22 122/60  06/23/22 126/70  05/27/22 129/66    Wt Readings from Last 3 Encounters:  07/06/22 178 lb (80.7 kg)  06/23/22 179 lb (81.2 kg)  05/27/22 172 lb (78 kg)  Physical Exam Constitutional:      General: He is not in acute distress.    Appearance: He is well-developed. He is obese.     Comments: NAD  Eyes:     Conjunctiva/sclera: Conjunctivae normal.     Pupils: Pupils are equal, round, and reactive to light.  Neck:     Thyroid: No thyromegaly.     Vascular: No JVD.  Cardiovascular:     Rate and Rhythm: Normal rate and regular rhythm.     Heart sounds: Normal heart sounds. No murmur heard.    No friction rub. No gallop.  Pulmonary:     Effort: Pulmonary effort  is normal. No respiratory distress.     Breath sounds: Normal breath sounds. No wheezing or rales.  Chest:     Chest wall: No tenderness.  Abdominal:     General: Bowel sounds are normal. There is no distension.     Palpations: Abdomen is soft. There is no mass.     Tenderness: There is no abdominal tenderness. There is no guarding or rebound.  Musculoskeletal:        General: No tenderness. Normal range of motion.     Cervical back: Normal range of motion.  Lymphadenopathy:     Cervical: No cervical adenopathy.  Skin:    General: Skin is warm and dry.     Findings: No rash.  Neurological:     Mental Status: He is alert and oriented to person, place, and time.     Cranial Nerves: No cranial nerve deficit.     Motor: No abnormal muscle tone.     Coordination: Coordination normal.     Gait: Gait normal.     Deep Tendon Reflexes: Reflexes are normal and symmetric.  Psychiatric:        Behavior: Behavior normal.        Thought Content: Thought content normal.        Judgment: Judgment normal.   RUQ - drain in place On O2 per [facility]  Lab Results  Component Value Date   WBC 9.2 05/27/2022   HGB 12.9 (L) 05/27/2022   HCT 40.1 05/27/2022   PLT 235 05/27/2022   GLUCOSE 92 05/27/2022   CHOL 153 11/23/2018   TRIG 201.0 (H) 11/23/2018   HDL 38.20 (L) 11/23/2018   LDLDIRECT 99.0 11/23/2018   LDLCALC 80 11/07/2017   ALT 15 05/27/2022   AST 20 05/27/2022   NA 139 05/27/2022   K 3.9 05/27/2022   CL 102 05/27/2022   CREATININE 0.86 05/27/2022   BUN 7 (L) 05/27/2022   CO2 28 05/27/2022   TSH 12.458 (H) 05/17/2022   PSA 0.06 (L) 07/22/2021   INR 1.0 05/04/2022    IR CONVERT BILIARY DRAIN TO INT EXT BILIARY DRAIN  Result Date: 05/27/2022 INDICATION: 77 year old male with history of calculus cholecystitis status post percutaneous cholecystostomy tube placement on 01/21/2022 presenting for follow-up after attempted percutaneous gallstone retrieval which was unsuccessful and resulted in  placement of an internal external biliary drain through the established cholecystostomy access. Postprocedural course was complicated by possible pancreatitis. EXAM: 1. Cholangiogram via established access. 2. Conversion of internal external cholecystostomy tube proper cholecystostomy drain MEDICATIONS: Rocephin 2 gm IV; The antibiotic was administered within an appropriate time frame prior to the initiation of the procedure. ANESTHESIA/SEDATION: Moderate (conscious) sedation was employed during this procedure. A total of Versed 1 mg and Fentanyl 50 mcg was administered intravenously. Moderate Sedation Time: 11 minutes. The patient's level of consciousness and  vital signs were monitored continuously by radiology nursing throughout the procedure under my direct supervision. FLUOROSCOPY TIME:  One hundred ninety-three mGy COMPLICATIONS: None immediate. PROCEDURE: Informed written consent was obtained from the patient after a thorough discussion of the procedural risks, benefits and alternatives. All questions were addressed. Maximal Sterile Barrier Technique was utilized including caps, mask, sterile gowns, sterile gloves, sterile drape, hand hygiene and skin antiseptic. A timeout was performed prior to the initiation of the procedure. Preprocedure scout radiograph demonstrates the indwelling internal external cholecystostomy tube in unchanged position. Subdermal Local anesthesia was provided at the tube entry site with 1% lidocaine. Contrast injection via the indwelling drain demonstrates appropriate position. The external portion of the drain was cut to release the inner pigtail. An Amplatz wire was then inserted into the distal duodenum and the catheter was removed. An 8 French sheath was placed over the wire. Pull-back sheath cholangiogram was then performed which demonstrated no evidence of significant extrahepatic biliary ductal dilation or filling defects that would suggest choledocholithiasis. The sheath was  then removed and a 10 [place] multipurpose drainage catheter was placed within the gallbladder lumen. Contrast injection demonstrates intraluminal position and patency of the cystic duct. The drain was affixed at the skin with an interrupted 0 silk suture. A sterile bandage was applied. The patient tolerated procedure well was transferred to the recovery area in good condition. IMPRESSION: 1. No evidence of choledocholithiasis. The gallbladder remains relatively contracted about the indwelling cholelithiasis. 2. Successful conversion of internal external cholecystostomy drain to proper cholecystostomy. PLAN: CT abdomen pelvis as an outpatient in approximately 2 weeks with IR clinic follow-up shortly thereafter to monitor pancreatitis. Routine check and exchange of cholecystostomy in 6-8 weeks. Marliss Coots, MD [facility] Radiology Electronically Signed   By: Marliss Coots M.D.   On: 05/27/2022 13:26    Assessment & Plan:   Problem List Items Addressed This Visit       Cardiovascular and Mediastinum   Atrial flutter    Cont w/Eliquis; Tenormin 50 mg po prn HR 55-70 at home      Relevant Orders   CBC with Differential/Platelet   Essential hypertension    Hold Norvasc, Atenolol due to low BP      Relevant Orders   Comprehensive metabolic panel   CBC with Differential/Platelet     Digestive   Pancreatitis, gallstone    Recent - mild, pos-procedure Repeat Lipase, amilase      Relevant Orders   Lipase     Endocrine   Hypothyroidism - Primary    Abn TSH. Monitor FT3 and FT4 . On Levothroid      Relevant Orders   T4, free   TSH     Other   Low back pain    Tylenol prn      Relevant Orders   CBC with Differential/Platelet      Meds ordered this encounter  Medications   diazepam (VALIUM) 5 MG tablet    Sig: Take 1 tablet (5 mg total) by mouth every 12 (twelve) hours as needed for anxiety.    Dispense:  180 tablet     Refill:  1      Follow-up: Return in about 6 weeks (around 08/17/2022) for a follow-up visit.  Sonda Primes, MD

## 2022-07-06 NOTE — Assessment & Plan Note (Signed)
Tylenol prn 

## 2022-07-06 NOTE — Assessment & Plan Note (Signed)
Hold Norvasc, Atenolol due to low BP 

## 2022-07-06 NOTE — Assessment & Plan Note (Signed)
Cont w/Eliquis; Tenormin 50 mg po prn HR 55-70 at home 

## 2022-07-06 NOTE — Assessment & Plan Note (Signed)
Recent - mild, pos-procedure Repeat Lipase, amilase

## 2022-07-06 NOTE — Telephone Encounter (Signed)
Echo order updated, patient scheduled for 08/03/22.

## 2022-07-13 ENCOUNTER — Ambulatory Visit (HOSPITAL_COMMUNITY)
Admission: RE | Admit: 2022-07-13 | Discharge: 2022-07-13 | Disposition: A | Discharge status: Home / Self Care | Payer: Medicare Other | Source: Ambulatory Visit | Attending: Interventional Radiology | Admitting: Interventional Radiology

## 2022-07-13 ENCOUNTER — Other Ambulatory Visit (HOSPITAL_COMMUNITY): Payer: Self-pay | Admitting: Interventional Radiology

## 2022-07-13 DIAGNOSIS — K8000 Calculus of gallbladder with acute cholecystitis without obstruction: Secondary | ICD-10-CM

## 2022-07-13 DIAGNOSIS — Z434 Encounter for attention to other artificial openings of digestive tract: Secondary | ICD-10-CM | POA: Diagnosis not present

## 2022-07-13 HISTORY — PX: IR EXCHANGE BILIARY DRAIN: IMG6046

## 2022-07-13 MED ORDER — LIDOCAINE HCL 1 % IJ SOLN
20.0000 mL | Freq: Once | INTRAMUSCULAR | Status: AC
  Administered 2022-07-13: 8 mL via INTRADERMAL

## 2022-07-13 MED ORDER — IOHEXOL 300 MG/ML  SOLN
50.0000 mL | Freq: Once | INTRAMUSCULAR | Status: AC | PRN
  Administered 2022-07-13: 10 mL

## 2022-07-13 MED ORDER — LIDOCAINE HCL 1 % IJ SOLN
INTRAMUSCULAR | Status: AC
  Filled 2022-07-13: qty 20

## 2022-07-28 ENCOUNTER — Other Ambulatory Visit: Payer: Self-pay | Admitting: Cardiology

## 2022-07-28 DIAGNOSIS — I480 Paroxysmal atrial fibrillation: Secondary | ICD-10-CM

## 2022-07-29 NOTE — Telephone Encounter (Signed)
Prescription refill request for Eliquis received. Indication: Afib  Last office visit: 07/22/21 Mayford Knife)  Scr: 0.88 (07/06/22)  Age: 77 Weight: 80.7kg  Office visit overdue. Pt has scheduled appt with Dr Mayford Knife on 10/29/22. Appropriate dose. Refill sent.

## 2022-08-03 ENCOUNTER — Encounter: Payer: Self-pay | Admitting: Cardiology

## 2022-08-03 ENCOUNTER — Ambulatory Visit (HOSPITAL_COMMUNITY): Payer: Medicare Other | Attending: Internal Medicine

## 2022-08-03 DIAGNOSIS — I77810 Thoracic aortic ectasia: Secondary | ICD-10-CM | POA: Insufficient documentation

## 2022-08-03 DIAGNOSIS — I350 Nonrheumatic aortic (valve) stenosis: Secondary | ICD-10-CM | POA: Insufficient documentation

## 2022-08-03 DIAGNOSIS — I351 Nonrheumatic aortic (valve) insufficiency: Secondary | ICD-10-CM | POA: Insufficient documentation

## 2022-08-03 LAB — ECHOCARDIOGRAM COMPLETE
AR max vel: 1.24 cm2
AV Area VTI: 1.32 cm2
AV Area mean vel: 1.19 cm2
AV Mean grad: 13 mmHg
AV Peak grad: 24.6 mmHg
Ao pk vel: 2.48 m/s
Area-P 1/2: 3.06 cm2
P 1/2 time: 598 msec
S' Lateral: 3.2 cm

## 2022-08-05 ENCOUNTER — Telehealth: Payer: Self-pay

## 2022-08-05 NOTE — Telephone Encounter (Signed)
-----   Message from Quintella Reichert, MD sent at 08/03/2022  2:43 PM EDT ----- 2D echo showed normal heart function EF 60 to 65% with trivial leakiness of the mitral valve and mild calcified aortic valve with mild aortic valve stenosis.  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  Please repeat 2D echo in 1 year to follow-up on AAS and ascending aortic dilatation.

## 2022-08-05 NOTE — Telephone Encounter (Signed)
Attempted to call patient at home # listed on DPR to discuss Echo results, no answer and not able to leave VM.

## 2022-08-06 ENCOUNTER — Other Ambulatory Visit: Payer: Self-pay | Admitting: Physician Assistant

## 2022-08-06 DIAGNOSIS — E039 Hypothyroidism, unspecified: Secondary | ICD-10-CM

## 2022-08-06 NOTE — Telephone Encounter (Signed)
The patient has been notified of the result and verbalized understanding.  All questions (if any) were answered. Asencion Gowda, LPN 1/61/0960 4:54 AM

## 2022-08-06 NOTE — Telephone Encounter (Signed)
Patient returned RN's call regarding results. 

## 2022-08-08 NOTE — Telephone Encounter (Signed)
Most recent dose was 137 mcg per chart review.

## 2022-08-11 ENCOUNTER — Telehealth: Payer: Self-pay

## 2022-08-11 NOTE — Telephone Encounter (Signed)
-----   Message from Traci R Turner, MD sent at 08/03/2022  2:43 PM EDT ----- 2D echo showed normal heart function EF 60 to 65% with trivial leakiness of the mitral valve and mild calcified aortic valve with mild aortic valve stenosis.  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  Please repeat 2D echo in 1 year to follow-up on AAS and ascending aortic dilatation. 

## 2022-08-11 NOTE — Telephone Encounter (Signed)
Left detailed VM per DPR asking patient to call our office to discuss Echo results.

## 2022-08-12 ENCOUNTER — Telehealth: Payer: Self-pay

## 2022-08-12 ENCOUNTER — Telehealth: Payer: Self-pay | Admitting: Cardiology

## 2022-08-12 DIAGNOSIS — I77810 Thoracic aortic ectasia: Secondary | ICD-10-CM

## 2022-08-12 DIAGNOSIS — I351 Nonrheumatic aortic (valve) insufficiency: Secondary | ICD-10-CM

## 2022-08-12 NOTE — Telephone Encounter (Signed)
Reviewed Echo results which showed normal heart function EF 60 to 65% with trivial leakiness of the mitral valve and mild calcified aortic valve with mild aortic valve stenosis. Patient verbalized understanding of mild enlargement of the ascending aorta. 1 year repeat echo order placed.

## 2022-08-12 NOTE — Telephone Encounter (Signed)
Patient was returning phone call about his echo results.

## 2022-08-12 NOTE — Telephone Encounter (Signed)
-----   Message from Traci R Turner, MD sent at 08/03/2022  2:43 PM EDT ----- 2D echo showed normal heart function EF 60 to 65% with trivial leakiness of the mitral valve and mild calcified aortic valve with mild aortic valve stenosis.  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  Please repeat 2D echo in 1 year to follow-up on AAS and ascending aortic dilatation. 

## 2022-08-19 ENCOUNTER — Telehealth: Payer: Self-pay | Admitting: Student

## 2022-08-19 NOTE — Telephone Encounter (Signed)
Patient called with concern that he has had only a small amount of drainage from his percutaneous cholecystostomy drain overnight. He denies fever, chills, nausea, vomiting, diarrhea, and abdominal pain. He states that he normally has a small amount of drainage overnight, but that on waking today there was "even less than normal." Given the short interval of decreased drainage, the lack of abdominal pain, nausea/vomiting/diarrhea, fever, or chills, patient was reassured and advised to call back if the above symptoms begin or if decreased drainage persists through tomorrow. Patient voiced his understanding.  Patient is already scheduled for routine cholecystostomy drain exchange on 09/07/22.  Kennieth Francois, PA-C 08/19/2022 9:25 AM

## 2022-08-20 ENCOUNTER — Other Ambulatory Visit: Payer: Self-pay | Admitting: Internal Medicine

## 2022-08-24 ENCOUNTER — Inpatient Hospital Stay: Payer: Medicare Other | Attending: Internal Medicine

## 2022-08-24 ENCOUNTER — Other Ambulatory Visit: Payer: Self-pay

## 2022-08-24 ENCOUNTER — Ambulatory Visit (HOSPITAL_COMMUNITY)
Admission: RE | Admit: 2022-08-24 | Discharge: 2022-08-24 | Disposition: A | Discharge status: Home / Self Care | Payer: Medicare Other | Source: Ambulatory Visit | Attending: Internal Medicine | Admitting: Internal Medicine

## 2022-08-24 DIAGNOSIS — C3490 Malignant neoplasm of unspecified part of unspecified bronchus or lung: Secondary | ICD-10-CM | POA: Diagnosis not present

## 2022-08-24 DIAGNOSIS — E039 Hypothyroidism, unspecified: Secondary | ICD-10-CM | POA: Insufficient documentation

## 2022-08-24 DIAGNOSIS — Z79899 Other long term (current) drug therapy: Secondary | ICD-10-CM | POA: Insufficient documentation

## 2022-08-24 DIAGNOSIS — Z9221 Personal history of antineoplastic chemotherapy: Secondary | ICD-10-CM | POA: Insufficient documentation

## 2022-08-24 DIAGNOSIS — Z87891 Personal history of nicotine dependence: Secondary | ICD-10-CM | POA: Insufficient documentation

## 2022-08-24 DIAGNOSIS — C3412 Malignant neoplasm of upper lobe, left bronchus or lung: Secondary | ICD-10-CM | POA: Insufficient documentation

## 2022-08-24 DIAGNOSIS — J439 Emphysema, unspecified: Secondary | ICD-10-CM | POA: Diagnosis not present

## 2022-08-24 DIAGNOSIS — C9110 Chronic lymphocytic leukemia of B-cell type not having achieved remission: Secondary | ICD-10-CM | POA: Insufficient documentation

## 2022-08-24 DIAGNOSIS — Z923 Personal history of irradiation: Secondary | ICD-10-CM | POA: Insufficient documentation

## 2022-08-24 DIAGNOSIS — D696 Thrombocytopenia, unspecified: Secondary | ICD-10-CM | POA: Insufficient documentation

## 2022-08-24 LAB — CMP (CANCER CENTER ONLY)
ALT: 15 U/L (ref 0–44)
AST: 19 U/L (ref 15–41)
Albumin: 3.9 g/dL (ref 3.5–5.0)
Alkaline Phosphatase: 62 U/L (ref 38–126)
Anion gap: 5 (ref 5–15)
BUN: 7 mg/dL — ABNORMAL LOW (ref 8–23)
CO2: 31 mmol/L (ref 22–32)
Calcium: 9.8 mg/dL (ref 8.9–10.3)
Chloride: 103 mmol/L (ref 98–111)
Creatinine: 0.84 mg/dL (ref 0.61–1.24)
GFR, Estimated: >60 mL/min (ref >60)
Glucose, Bld: 103 mg/dL — ABNORMAL HIGH (ref 70–99)
Potassium: 4 mmol/L (ref 3.5–5.1)
Sodium: 139 mmol/L (ref 135–145)
Total Bilirubin: 1 mg/dL (ref 0.3–1.2)
Total Protein: 7.3 g/dL (ref 6.5–8.1)

## 2022-08-24 LAB — CBC WITH DIFFERENTIAL (CANCER CENTER ONLY)
Abs Immature Granulocytes: 0.01 10*3/uL (ref 0.00–0.07)
Basophils Absolute: 0 10*3/uL (ref 0.0–0.1)
Basophils Relative: 1 %
Eosinophils Absolute: 0.1 10*3/uL (ref 0.0–0.5)
Eosinophils Relative: 2 %
HCT: 41.2 % (ref 39.0–52.0)
Hemoglobin: 13.4 g/dL (ref 13.0–17.0)
Immature Granulocytes: 0 %
Lymphocytes Relative: 21 %
Lymphs Abs: 1.8 10*3/uL (ref 0.7–4.0)
MCH: 29.3 pg (ref 26.0–34.0)
MCHC: 32.5 g/dL (ref 30.0–36.0)
MCV: 90.2 fL (ref 80.0–100.0)
Monocytes Absolute: 0.8 10*3/uL (ref 0.1–1.0)
Monocytes Relative: 10 %
Neutro Abs: 5.8 10*3/uL (ref 1.7–7.7)
Neutrophils Relative %: 66 %
Platelet Count: 280 10*3/uL (ref 150–400)
RBC: 4.57 MIL/uL (ref 4.22–5.81)
RDW: 12.5 % (ref 11.5–15.5)
WBC Count: 8.6 10*3/uL (ref 4.0–10.5)
nRBC: 0 % (ref 0.0–0.2)

## 2022-08-24 LAB — TSH: TSH: 2.689 u[IU]/mL (ref 0.350–4.500)

## 2022-08-24 MED ORDER — IOHEXOL 300 MG/ML  SOLN
75.0000 mL | Freq: Once | INTRAMUSCULAR | Status: AC | PRN
  Administered 2022-08-24: 75 mL via INTRAVENOUS

## 2022-08-24 MED ORDER — SODIUM CHLORIDE (PF) 0.9 % IJ SOLN
INTRAMUSCULAR | Status: AC
  Filled 2022-08-24: qty 50

## 2022-08-25 ENCOUNTER — Other Ambulatory Visit (HOSPITAL_COMMUNITY): Payer: Self-pay

## 2022-08-26 ENCOUNTER — Other Ambulatory Visit (HOSPITAL_COMMUNITY): Payer: Self-pay

## 2022-08-26 MED ORDER — NORMAL SALINE FLUSH 0.9 % IV SOLN
10.0000 mL | Freq: Every day | INTRAVENOUS | 2 refills | Status: DC
  Filled 2022-08-26: qty 560, 56d supply, fill #0
  Filled 2022-10-13 – 2022-10-21 (×2): qty 560, 56d supply, fill #1
  Filled 2022-12-13: qty 560, 56d supply, fill #2

## 2022-08-30 ENCOUNTER — Inpatient Hospital Stay (HOSPITAL_BASED_OUTPATIENT_CLINIC_OR_DEPARTMENT_OTHER): Payer: Medicare Other | Admitting: Internal Medicine

## 2022-08-30 ENCOUNTER — Other Ambulatory Visit: Payer: Self-pay

## 2022-08-30 VITALS — BP 132/72 | HR 63 | Temp 97.7°F | Resp 16 | Wt 179.0 lb

## 2022-08-30 DIAGNOSIS — Z79899 Other long term (current) drug therapy: Secondary | ICD-10-CM | POA: Diagnosis not present

## 2022-08-30 DIAGNOSIS — Z923 Personal history of irradiation: Secondary | ICD-10-CM | POA: Diagnosis not present

## 2022-08-30 DIAGNOSIS — C9110 Chronic lymphocytic leukemia of B-cell type not having achieved remission: Secondary | ICD-10-CM | POA: Diagnosis not present

## 2022-08-30 DIAGNOSIS — Z87891 Personal history of nicotine dependence: Secondary | ICD-10-CM | POA: Diagnosis not present

## 2022-08-30 DIAGNOSIS — D696 Thrombocytopenia, unspecified: Secondary | ICD-10-CM | POA: Diagnosis not present

## 2022-08-30 DIAGNOSIS — C3412 Malignant neoplasm of upper lobe, left bronchus or lung: Secondary | ICD-10-CM | POA: Diagnosis not present

## 2022-08-30 DIAGNOSIS — C3490 Malignant neoplasm of unspecified part of unspecified bronchus or lung: Secondary | ICD-10-CM

## 2022-08-30 DIAGNOSIS — Z9221 Personal history of antineoplastic chemotherapy: Secondary | ICD-10-CM | POA: Diagnosis not present

## 2022-08-30 NOTE — Progress Notes (Signed)
[facility] [phone]   [phone]  OFFICE PROGRESS NOTE  [name], [name], MD [address] [place] [address]  DIAGNOSIS: 1) Stage IIIb non-small cell lung cancer, squamous cell carcinoma.  The patient presented with a left upper lobe lung mass as well as associated mediastinal lymphadenopathy and contralateral right hilar mild hypermetabolism.  There was heterogeneous marrow uptake in the spine but with more focal areas at L4 without imaging correlation on CT scan.  The patient was diagnosed in August 2021. 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  First dose expected on 12/13/2019. Status post 5 cycles.   Last dose was giving January 14, 2020. 2) Consolidation treatment with immunotherapy with Imfinzi 1500 mg IV every 4 weeks.  First dose February 19, 2020.  Status post 13 cycles.   CURRENT THERAPY: Observation.  INTERVAL HISTORY: MAKAIH TATGE 77 y.o. male returns to the clinic today for follow-up visit.  The patient is feeling fine today with no concerning complaints except for the baseline shortness of breath and he is currently on home oxygen.  He also had some issues with the biliary drainage recently but this is improved.  He denied having any chest pain, cough or hemoptysis.  He has no nausea, vomiting, diarrhea or constipation.  He has no headache or visual changes.  He is here today for evaluation with repeat CT scan of the chest for restaging of his disease. 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   chronic stage 1- no symtoms   CLL (chronic lymphocytic leukemia) (HCC) 08/08/2014   2014/10/13 Dr Mosetta Putt Stage 0   COPD mixed type (HCC) 07/26/2007   Smoker - stopped 6/14    De Quervain's tenosynovitis, right 11-Jan-2014   2015    Depression    at times   Dysrhythmia    PAF   Dysuria 2014-01-11   9/15 - poss stricture Urol ref was offered    Gallstones 11/16/2017   Asymptomatic Pt refused surg ref   Generalized anxiety disorder 09/07/2012   Chronic   Potential benefits of a long term steroid  use as well as potential risks  and complications were explained to the patient and were aknowledged.      GERD 12/02/2006   Chronic      Grade I diastolic dysfunction 01/20/2022   Grief 06/12/16   [name] died in 10/13/14   Gynecomastia 01/11/2014   Benign B 2015    Hyperlipidemia    Hypertension    Hypothyroidism 12/25/2014   10/13/2014 On Levothyroxine    Intertrigo 02/02/2012   11/13    Neoplasm of uncertain behavior of skin 03/12/2013   12/14 R ear, chest    OSA on CPAP    mild with AHI 9/hr and oxygen desats as low as 75%   Paresis (HCC)    right- s/p cerv decompression   PERIORBITAL CELLULITIS 02/22/2009   Qualifier: Diagnosis of  ByErmalene Searing MD, Amy     Retinal detachment    L>>R    ALLERGIES:  has No Known Allergies.  MEDICATIONS:  Current Outpatient Medications  Medication Sig Dispense Refill   albuterol (PROVENTIL) (2.5 MG/3ML) 0.083% nebulizer solution Take 3 mLs (2.5 mg total) by nebulization every 6 (six) hours as needed for wheezing or shortness of breath. 75 mL 12   amLODipine (NORVASC) 5 MG tablet TAKE 1 TABLET BY MOUTH EVERY DAY 90 tablet 1   apixaban (ELIQUIS) 5 MG TABS tablet TAKE 1 TABLET(5 MG) BY MOUTH TWICE DAILY 180 tablet 0   atenolol (TENORMIN) 25 MG tablet Take 1 tablet (25 mg total) by mouth daily as needed (palpitations). 90 tablet 0   Cholecalciferol 25 MCG (1000 UT) tablet Take 1,000 Units by mouth daily.      clotrimazole-betamethasone (LOTRISONE) cream Apply topically 2 (two) times daily. (Patient not taking: Reported on 04/16/2022) 45 g 2   diazepam (VALIUM) 5 MG tablet Take 1 tablet (5 mg total) by mouth every 12 (twelve) hours as needed for anxiety. 180 tablet 1   levothyroxine (SYNTHROID) 137 MCG tablet Take 1 tablet (137 mcg total) by mouth daily before breakfast. 30 tablet 2   levothyroxine (SYNTHROID) 137 MCG tablet Take 1 tablet (137 mcg total) by mouth daily before breakfast. 30 tablet 2   lovastatin (MEVACOR) 20 MG tablet TAKE 1 TABLET BY MOUTH EVERY NIGHT AT BEDTIME 90 tablet 2   Multiple Vitamin (MULTIVITAMIN) tablet Take 1 tablet by mouth daily. Centrum Silver.     Polyethyl Glycol-Propyl Glycol (SYSTANE ULTRA OP) Place 1 drop into both eyes 2 (two) times daily as needed (dry eyes).     Sodium Chloride Flush (NORMAL SALINE FLUSH) 0.9 % SOLN Flush gallbladder drain with 5 - 10 ml once daily 560 mL 2   No current facility-administered medications for this visit.    SURGICAL HISTORY:  Past Surgical History:  Procedure Laterality Date   BICEPS TENDON REPAIR     BRONCHIAL BIOPSY  10/23/2019   Procedure: BRONCHIAL BIOPSIES;  Surgeon: Leslye Peer, MD;  Location: [facility];  Service: Pulmonary;;   BRONCHIAL BIOPSY  11/20/2019   Procedure: BRONCHIAL BIOPSIES;  Surgeon: Leslye Peer, MD;  Location: [facility];  Service: Pulmonary;;   BRONCHIAL BRUSHINGS  10/23/2019   Procedure: BRONCHIAL BRUSHINGS;  Surgeon: Leslye Peer, MD;  Location: [facility] ENDOSCOPY;  Service: Pulmonary;;   BRONCHIAL BRUSHINGS  11/20/2019   Procedure: BRONCHIAL BRUSHINGS;  Surgeon: Leslye Peer, MD;  Location: [facility] ENDOSCOPY;  Service: Pulmonary;;   BRONCHIAL NEEDLE ASPIRATION BIOPSY  10/23/2019   Procedure: BRONCHIAL NEEDLE ASPIRATION BIOPSIES;  Surgeon: Leslye Peer, MD;  Location: [facility]OSCOPY;  Service: Pulmonary;;   BRONCHIAL NEEDLE ASPIRATION BIOPSY  11/20/2019   Procedure: BRONCHIAL NEEDLE ASPIRATION  BIOPSIES;  Surgeon: Leslye Peer, MD;  Location: [facility]OS[facility];  Service: Pulmonary;;   BRONCHIAL WASHINGS  11/20/2019   Procedure: BRONCHIAL WASHINGS;  Surgeon: Leslye Peer, MD;  Location: [facility] ENDOSCOPY;  Service: Pulmonary;;   CATARACT EXTRACTION Left    COLONOSCOPY  04-14-99   Dr Herma Ard polyp-TA in epic   IR BALLOON DILATION OF BILIARY DUCTS/AMPULLA  05/04/2022   IR CONVERT BILIARY DRAIN TO INT EXT BILIARY DRAIN  05/04/2022   IR CONVERT BILIARY DRAIN TO INT EXT BILIARY DRAIN  05/27/2022   IR EXCHANGE BILIARY DRAIN  02/02/2022   IR EXCHANGE BILIARY DRAIN  03/19/2022   IR EXCHANGE BILIARY DRAIN  07/13/2022   IR PERC  CHOLECYSTOSTOMY  01/21/2022   IR RADIOLOGIST EVAL & MGMT  04/08/2022   IR RADIOLOGIST EVAL & MGMT  05/17/2022   IR REMOVAL OF CALCULI/DEBRIS BILIARY DUCT/GB  05/04/2022   POLYPECTOMY  04-14-99   POSTERIOR LAMINECTOMY / DECOMPRESSION CERVICAL SPINE     Dr Wynetta Emery   RETINAL DETACHMENT SURGERY     left eye, 2007 x2, 2008 x 3   ROTATOR CUFF REPAIR  2004   right   TONSILLECTOMY  [id number]   VIDEO BRONCHOSCOPY WITH ENDOBRONCHIAL NAVIGATION N/A 10/23/2019   Procedure: VIDEO BRONCHOSCOPY WITH ENDOBRONCHIAL NAVIGATION;  Surgeon: Leslye Peer, MD;  Location: [facility] ENDOSCOPY;  Service: Pulmonary;  Laterality: N/A;   VIDEO BRONCHOSCOPY WITH ENDOBRONCHIAL NAVIGATION N/A 11/20/2019   Procedure: VIDEO BRONCHOSCOPY WITH ENDOBRONCHIAL NAVIGATION;  Surgeon: Leslye Peer, MD;  Location: [facility]OS[facility];  Service: Pulmonary;  Laterality: N/A;   VIDEO BRONCHOSCOPY WITH ENDOBRONCHIAL ULTRASOUND N/A 10/23/2019   Procedure: VIDEO BRONCHOSCOPY WITH ENDOBRONCHIAL ULTRASOUND;  Surgeon: Leslye Peer, MD;  Location: [facility] ENDOSCOPY;  Service: Pulmonary;  Laterality: N/A;    REVIEW OF SYSTEMS:  A comprehensive review of systems was negative except for: Constitutional: positive for fatigue Respiratory: positive for dyspnea on exertion   PHYSICAL EXAMINATION: General appearance: alert, cooperative, and no  distress Head: Normocephalic, without obvious abnormality, atraumatic Neck: no adenopathy, no JVD, supple, symmetrical, trachea midline, and thyroid not enlarged, symmetric, no tenderness/mass/nodules Lymph nodes: Cervical, supraclavicular, and axillary nodes normal. Resp: clear to auscultation bilaterally Back: symmetric, no curvature. ROM normal. No CVA tenderness. Cardio: regular rate and rhythm, S1, S2 normal, no murmur, click, rub or gallop GI: soft, non-tender; bowel sounds normal; no masses,  no organomegaly Extremities: extremities normal, atraumatic, no cyanosis or edema  ECOG PERFORMANCE STATUS: 1 - Symptomatic but completely ambulatory  Blood pressure 132/72, pulse 63, temperature 97.7 F (36.5 C), temperature source Oral, resp. rate 16, weight 179 lb (81.2 kg), SpO2 96 %.  LABORATORY DATA: Lab Results  Component Value Date   WBC 8.6 08/24/2022   HGB 13.4 08/24/2022   HCT 41.2 08/24/2022   MCV 90.2 08/24/2022   PLT 280 08/24/2022      Chemistry      Component Value Date/Time   NA 139 08/24/2022 1059   NA 143 03/10/2017 0911   K 4.0 08/24/2022 1059   K 4.7 03/10/2017 0911   CL 103 08/24/2022 1059   CO2 31 08/24/2022 1059   CO2 30 (H) 03/10/2017 0911   BUN 7 (L) 08/24/2022 1059   BUN 10.7 03/10/2017 0911   CREATININE 0.84 08/24/2022 1059   CREATININE 0.8 03/10/2017 0911      Component Value Date/Time   CALCIUM 9.8 08/24/2022 1059   CALCIUM 9.4 03/10/2017 0911   ALKPHOS 62 08/24/2022 1059   ALKPHOS 76 03/10/2017 0911   AST 19 08/24/2022 1059   AST 34 03/10/2017 0911   ALT 15 08/24/2022 1059   ALT 45 03/10/2017 0911   BILITOT 1.0 08/24/2022 1059   BILITOT 1.41 (H) 03/10/2017 0911       RADIOGRAPHIC STUDIES: CT Chest W Contrast  Result Date: 08/26/2022 CLINICAL DATA:  Non-small cell lung cancer staging; * Tracking Code: BO * EXAM: CT CHEST WITH CONTRAST TECHNIQUE: Multidetector CT imaging of the chest was performed during intravenous contrast  administration. RADIATION DOSE REDUCTION: This exam was performed according to the departmental dose-optimization program which includes automated exposure control, adjustment of the mA and/or kV according to patient size and/or use of iterative reconstruction technique. CONTRAST:  75mL  OMNIPAQUE IOHEXOL 300 MG/ML  SOLN COMPARISON:  CT chest, abdomen and pelvis dated May 17, 2022 FINDINGS: Cardiovascular: Normal heart size. No pericardial effusion. Ascending thoracic aorta is upper limits of normal in size, measuring up to 3.8 cm. Mild aortic atherosclerotic disease. Moderate coronary artery calcifications. Mediastinum/Nodes: Esophagus and thyroid are unremarkable. No pathologically enlarged lymph nodes seen in the chest. Lungs/Pleura: Central airways are patent. Moderate centrilobular emphysema. Stable left upper lobe postradiation change. Stable right upper lobe pulmonary nodule measuring 5 mm on series 6, image 74. No pleural effusion. Upper Abdomen: Gallstones. Gallbladder is decompressed and contains a cholecystostomy tube. Interval decrease peripancreatic inflammatory changes. Musculoskeletal: Stable L1 compression fracture. No aggressive appearing osseous lesions. IMPRESSION: 1. Stable left upper lobe postradiation change. No evidence of recurrent or metastatic disease in the chest. 2. Stable right upper lobe solid pulmonary nodule measuring 5 mm. 3. Cholelithiasis with percutaneous cystostomy tube in place. 4. Interval decrease in peripancreatic inflammatory changes. 5. Coronary artery calcifications, aortic Atherosclerosis (ICD10-I70.0) and Emphysema (ICD10-J43.9). Electronically Signed   By: Allegra Lai M.D.   On: 08/26/2022 09:16   ECHOCARDIOGRAM COMPLETE  Result Date: 08/03/2022    ECHOCARDIOGRAM REPORT   Patient Name:   THAYER YAZDANI Date of Exam: 08/03/2022 Medical Rec #:  [id number]        Height:       71.0 in Accession #:    [id number]       Weight:       178.0 lb Date of Birth:   May 27, 1945       BSA:          2.007 m Patient Age:    76 years         BP:           146/74 mmHg Patient Gender: M                HR:           58 bpm. Exam Location:  [address] Procedure: 2D Echo, Cardiac Doppler, Color Doppler and Strain Analysis Indications:    I35.9 Aortic Valve Disease  History:        Patient has prior history of Echocardiogram examinations, most                 recent 03/03/2021. COPD, Arrythmias:Atrial Fibrillation and                 Atrial Flutter, Signs/Symptoms:Dyspnea; Risk                 Factors:Hypertension and Sleep Apnea.  Sonographer:    Clearence Ped RCS Referring Phys: [phone] TRACI R TURNER IMPRESSIONS  1. Left ventricular ejection fraction, by estimation, is 60 to 65%. The left ventricle has normal function. The left ventricle has no regional wall motion abnormalities. Left ventricular diastolic parameters were grossly normal.  2. Right ventricular systolic function is normal. The right ventricular size is normal. There is normal pulmonary artery systolic pressure. The estimated right ventricular systolic pressure is 29.8 mmHg.  3. The mitral valve is normal in structure. Trivial mitral valve regurgitation. No evidence of mitral stenosis.  4. The aortic valve is tricuspid. There is mild calcification of the aortic valve. There is mild thickening of the aortic valve. Aortic valve regurgitation is mild. Mild aortic valve stenosis. Aortic valve area, by VTI measures 1.32 cm. Aortic valve mean gradient measures 13.0 mmHg. Aortic valve Vmax measures 2.48 m/s.  5. Aortic dilatation noted. There is borderline dilatation  of the ascending aorta, measuring 42 mm.  6. The inferior vena cava is normal in size with greater than 50% respiratory variability, suggesting right atrial pressure of 3 mmHg. FINDINGS  Left Ventricle: Left ventricular ejection fraction, by estimation, is 60 to 65%. The left ventricle has normal function. The left ventricle has no regional wall motion  abnormalities. Global longitudinal strain performed but not reported based on interpreter judgement due to suboptimal tracking. The left ventricular internal cavity size was normal in size. There is no left ventricular hypertrophy. Left ventricular diastolic parameters were normal. Right Ventricle: The right ventricular size is normal. No increase in right ventricular wall thickness. Right ventricular systolic function is normal. There is normal pulmonary artery systolic pressure. The tricuspid regurgitant velocity is 2.59 m/s, and  with an assumed right atrial pressure of 3 mmHg, the estimated right ventricular systolic pressure is 29.8 mmHg. Left Atrium: Left atrial size was normal in size. Right Atrium: Right atrial size was normal in size. Pericardium: There is no evidence of pericardial effusion. Mitral Valve: The mitral valve is normal in structure. Trivial mitral valve regurgitation. No evidence of mitral valve stenosis. Tricuspid Valve: The tricuspid valve is normal in structure. Tricuspid valve regurgitation is trivial. No evidence of tricuspid stenosis. Aortic Valve: The aortic valve is tricuspid. There is mild calcification of the aortic valve. There is mild thickening of the aortic valve. Aortic valve regurgitation is mild. Aortic regurgitation PHT measures 598 msec. Mild aortic stenosis is present. Aortic valve mean gradient measures 13.0 mmHg. Aortic valve peak gradient measures 24.6 mmHg. Aortic valve area, by VTI measures 1.32 cm. Pulmonic Valve: The pulmonic valve was normal in structure. Pulmonic valve regurgitation is not visualized. No evidence of pulmonic stenosis. Aorta: Aortic dilatation noted. There is borderline dilatation of the ascending aorta, measuring 42 mm. Venous: The inferior vena cava is normal in size with greater than 50% respiratory variability, suggesting right atrial pressure of 3 mmHg. IAS/Shunts: No atrial level shunt detected by color flow Doppler.  LEFT VENTRICLE PLAX 2D  LVIDd:         4.90 cm   Diastology LVIDs:         3.20 cm   LV e' medial:    8.16 cm/s LV PW:         1.00 cm   LV E/e' medial:  9.2 LV IVS:        0.70 cm   LV e' lateral:   8.05 cm/s LVOT diam:     2.10 cm   LV E/e' lateral: 9.3 LV SV:         74 LV SV Index:   37 LVOT Area:     3.46 cm  RIGHT VENTRICLE RV Basal diam:  2.80 cm RV S prime:     11.90 cm/s TAPSE (M-mode): 3.0 cm RVSP:           29.8 mmHg LEFT ATRIUM             Index        RIGHT ATRIUM           Index LA diam:        3.00 cm 1.50 cm/m   RA Pressure: 3.00 mmHg LA Vol (A2C):   45.8 ml 22.82 ml/m  RA Area:     13.60 cm LA Vol (A4C):   21.4 ml 10.66 ml/m  RA Volume:   34.40 ml  17.14 ml/m LA Biplane Vol: 31.2 ml 15.55 ml/m  AORTIC VALVE AV  Area (Vmax):    1.24 cm AV Area (Vmean):   1.19 cm AV Area (VTI):     1.32 cm AV Vmax:           248.00 cm/s AV Vmean:          172.000 cm/s AV VTI:            0.561 m AV Peak Grad:      24.6 mmHg AV Mean Grad:      13.0 mmHg LVOT Vmax:         88.60 cm/s LVOT Vmean:        59.100 cm/s LVOT VTI:          0.213 m LVOT/AV VTI ratio: 0.38 AI PHT:            598 msec  AORTA Ao Root diam: 3.80 cm Ao Asc diam:  4.20 cm MITRAL VALVE               TRICUSPID VALVE MV Area (PHT):             TR Peak grad:   26.8 mmHg MV Decel Time:             TR Vmax:        259.00 cm/s MV E velocity: 74.90 cm/s  Estimated RAP:  3.00 mmHg MV A velocity: 77.60 cm/s  RVSP:           29.8 mmHg MV E/A ratio:  0.97                            SHUNTS                            Systemic VTI:  0.21 m                            Systemic Diam: 2.10 cm Weston Brass MD Electronically signed by Weston Brass MD Signature Date/Time: 08/03/2022/2:16:37 PM    Final      ASSESSMENT AND PLAN: This is a very pleasant 77 years old white male with a stage IIIb non-small cell lung cancer, squamous cell carcinoma diagnosed in August of 2021.  The patient also has a history of stage 0 CLL.  He underwent a course of concurrent chemoradiation with  weekly carboplatin for AUC of 2 and paclitaxel 45 mg/M2 status post 5 cycles of the treatment.  He tolerated this treatment well with no concerning adverse effect except for mild odynophagia and dysphagia as well as thrombocytopenia. His scan showed improvement of his disease with decrease in the size of the left upper lobe lung mass as well as decrease in the mediastinal lymphadenopathy and no evidence of metastatic disease. The patient completed consolidation treatment with immunotherapy with Imfinzi 1500 mg IV every 4 weeks status post 13 cycles. Previous CT scan of the chest showed the left upper lobe postradiation changes with associated areas of increased soft tissue concerning for evolving postradiation changes or recurrent disease and PET scan was recommended for further evaluation.  He also had slightly increased size of subcarinal lymph node.  The patient had a PET scan performed on January 15, 2022 and that showed a stable appearance of the masslike architecture and fibrosis with volume loss involving the left upper lobe and left upper hilar region with low-level tracer uptake with no signs of tracer  avid tumor recurrence or metastatic disease.  The patient is currently on observation and he had repeat CT scan of the chest performed recently.  I personally and independently reviewed the scan and discussed the result with the patient today. His scan showed no concerning findings for disease recurrence or metastasis. I recommended for him to continue on observation with repeat CT scan of the chest in 6 months. Regarding the history of CLL, his total white blood count is within the normal range and we will continue to monitor him for now. The patient was advised to call immediately if he has any other concerning symptoms in the interval. The patient voices understanding of current disease status and treatment options and is in agreement with the current care plan. The total time spent in the  appointment was 30 minutes.  All questions were answered. The patient knows to call the clinic with any problems, questions or concerns. We can certainly see the patient much sooner if necessary.  Disclaimer: This note was dictated with voice recognition software. Similar sounding words can inadvertently be transcribed and may not be corrected upon review.

## 2022-09-07 ENCOUNTER — Encounter (INDEPENDENT_AMBULATORY_CARE_PROVIDER_SITE_OTHER): Payer: Medicare Other | Admitting: Ophthalmology

## 2022-09-07 ENCOUNTER — Other Ambulatory Visit (HOSPITAL_COMMUNITY): Payer: Self-pay | Admitting: Interventional Radiology

## 2022-09-07 ENCOUNTER — Ambulatory Visit (HOSPITAL_COMMUNITY)
Admission: RE | Admit: 2022-09-07 | Discharge: 2022-09-07 | Disposition: A | Discharge status: Home / Self Care | Payer: Medicare Other | Source: Ambulatory Visit | Attending: Interventional Radiology | Admitting: Interventional Radiology

## 2022-09-07 DIAGNOSIS — Z434 Encounter for attention to other artificial openings of digestive tract: Secondary | ICD-10-CM | POA: Diagnosis not present

## 2022-09-07 DIAGNOSIS — H43811 Vitreous degeneration, right eye: Secondary | ICD-10-CM

## 2022-09-07 DIAGNOSIS — K8000 Calculus of gallbladder with acute cholecystitis without obstruction: Secondary | ICD-10-CM | POA: Diagnosis not present

## 2022-09-07 DIAGNOSIS — I10 Essential (primary) hypertension: Secondary | ICD-10-CM | POA: Diagnosis not present

## 2022-09-07 DIAGNOSIS — H353122 Nonexudative age-related macular degeneration, left eye, intermediate dry stage: Secondary | ICD-10-CM

## 2022-09-07 DIAGNOSIS — H35033 Hypertensive retinopathy, bilateral: Secondary | ICD-10-CM | POA: Diagnosis not present

## 2022-09-07 DIAGNOSIS — H34831 Tributary (branch) retinal vein occlusion, right eye, with macular edema: Secondary | ICD-10-CM | POA: Diagnosis not present

## 2022-09-07 DIAGNOSIS — H338 Other retinal detachments: Secondary | ICD-10-CM

## 2022-09-07 HISTORY — PX: IR EXCHANGE BILIARY DRAIN: IMG6046

## 2022-09-07 MED ORDER — LIDOCAINE-EPINEPHRINE 1 %-1:100000 IJ SOLN
INTRAMUSCULAR | Status: AC
  Filled 2022-09-07: qty 1

## 2022-09-07 MED ORDER — IOHEXOL 300 MG/ML  SOLN
50.0000 mL | Freq: Once | INTRAMUSCULAR | Status: AC | PRN
  Administered 2022-09-07: 10 mL

## 2022-09-07 MED ORDER — LIDOCAINE-EPINEPHRINE 1 %-1:100000 IJ SOLN: 20.0000 mL | Freq: Once | INTRAMUSCULAR | Status: DC

## 2022-10-05 ENCOUNTER — Encounter: Payer: Self-pay | Admitting: Internal Medicine

## 2022-10-05 ENCOUNTER — Ambulatory Visit: Payer: Medicare Other | Admitting: Internal Medicine

## 2022-10-05 VITALS — BP 120/74 | HR 60 | Temp 98.6°F | Ht 71.0 in | Wt 180.0 lb

## 2022-10-05 DIAGNOSIS — I480 Paroxysmal atrial fibrillation: Secondary | ICD-10-CM

## 2022-10-05 DIAGNOSIS — M25512 Pain in left shoulder: Secondary | ICD-10-CM

## 2022-10-05 DIAGNOSIS — I10 Essential (primary) hypertension: Secondary | ICD-10-CM

## 2022-10-05 DIAGNOSIS — C9110 Chronic lymphocytic leukemia of B-cell type not having achieved remission: Secondary | ICD-10-CM | POA: Diagnosis not present

## 2022-10-05 DIAGNOSIS — C3492 Malignant neoplasm of unspecified part of left bronchus or lung: Secondary | ICD-10-CM

## 2022-10-05 DIAGNOSIS — E039 Hypothyroidism, unspecified: Secondary | ICD-10-CM | POA: Diagnosis not present

## 2022-10-05 DIAGNOSIS — J449 Chronic obstructive pulmonary disease, unspecified: Secondary | ICD-10-CM

## 2022-10-05 DIAGNOSIS — K810 Acute cholecystitis: Secondary | ICD-10-CM

## 2022-10-05 DIAGNOSIS — K8020 Calculus of gallbladder without cholecystitis without obstruction: Secondary | ICD-10-CM | POA: Diagnosis not present

## 2022-10-05 NOTE — Patient Instructions (Signed)
A contour pillow  Blue-Emu cream use 2-3 times a day

## 2022-10-05 NOTE — Progress Notes (Signed)
Subjective:  Patient ID: Joe [name], male    DOB: [birth year]  Age: 77 y.o. MRN: [id number]  CC: Follow-up (3 mnth f/u, left shoulder pain off and on)   HPI [name] presents for COPD on O2, lung cancer  Outpatient Medications Prior to Visit  Medication Sig Dispense Refill   albuterol (PROVENTIL) (2.5 MG/3ML) 0.083% nebulizer solution Take 3 mLs (2.5 mg total) by nebulization every 6 (six) hours as needed for wheezing or shortness of breath. 75 mL 12   amLODipine (NORVASC) 5 MG tablet TAKE 1 TABLET BY MOUTH EVERY DAY 90 tablet 1   apixaban (ELIQUIS) 5 MG TABS tablet TAKE 1 TABLET(5 MG) BY MOUTH TWICE DAILY 180 tablet 0   atenolol (TENORMIN) 25 MG tablet Take 1 tablet (25 mg total) by mouth daily as needed (palpitations). 90 tablet 0   Cholecalciferol 25 MCG (1000 UT) tablet Take 1,000 Units by mouth daily.     diazepam (VALIUM) 5 MG tablet Take 1 tablet (5 mg total) by mouth every 12 (twelve) hours as needed for anxiety. 180 tablet 1   levothyroxine (SYNTHROID) 137 MCG tablet Take 1 tablet (137 mcg total) by mouth daily before breakfast. 30 tablet 2   levothyroxine (SYNTHROID) 137 MCG tablet Take 1 tablet (137 mcg total) by mouth daily before breakfast. 30 tablet 2   lovastatin (MEVACOR) 20 MG tablet TAKE 1 TABLET BY MOUTH EVERY NIGHT AT BEDTIME 90 tablet 2   Multiple Vitamin (MULTIVITAMIN) tablet Take 1 tablet by mouth daily. Centrum Silver.     Polyethyl Glycol-Propyl Glycol (SYSTANE ULTRA OP) Place 1 drop into both eyes 2 (two) times daily as needed (dry eyes).     Sodium Chloride Flush (NORMAL SALINE FLUSH) 0.9 % SOLN Flush gallbladder drain with 5 - 10 ml once daily 560 mL 2   clotrimazole-betamethasone (LOTRISONE) cream Apply topically 2 (two) times daily. (Patient not taking: Reported on 04/16/2022) 45 g 2   No facility-administered medications prior to visit.    ROS: Review of Systems  Constitutional:  Negative for appetite change, fatigue and unexpected weight  change.  HENT:  Negative for congestion, nosebleeds, sneezing, sore throat and trouble swallowing.   Eyes:  Negative for itching and visual disturbance.  Respiratory:  Positive for shortness of breath. Negative for cough.   Cardiovascular:  Negative for chest pain, palpitations and leg swelling.  Gastrointestinal:  Negative for abdominal distention, blood in stool, diarrhea and nausea.  Genitourinary:  Negative for frequency and hematuria.  Musculoskeletal:  Negative for back pain, gait problem, joint swelling and neck pain.  Skin:  Negative for rash.  Neurological:  Negative for dizziness, tremors, speech difficulty and weakness.  Psychiatric/Behavioral:  Negative for agitation, dysphoric mood and sleep disturbance. The patient is not nervous/anxious.     Objective:  BP 120/74 (BP Location: Left Arm, Patient Position: Sitting, Cuff Size: Large)   Pulse 60   Temp 98.6 F (37 C) (Oral)   Ht 5\' 11"  (1.803 m)   Wt 180 lb (81.6 kg)   SpO2 95%   BMI 25.10 kg/m   BP Readings from Last 3 Encounters:  10/05/22 120/74  08/30/22 132/72  07/06/22 122/60    Wt Readings from Last 3 Encounters:  10/05/22 180 lb (81.6 kg)  08/30/22 179 lb (81.2 kg)  07/06/22 178 lb (80.7 kg)    Physical Exam Constitutional:      General: He is not in acute distress.    Appearance: Normal appearance. He is well-developed.  Comments: NAD  Eyes:     Conjunctiva/sclera: Conjunctivae normal.     Pupils: Pupils are equal, round, and reactive to light.  Neck:     Thyroid: No thyromegaly.     Vascular: No JVD.  Cardiovascular:     Rate and Rhythm: Normal rate and regular rhythm.     Heart sounds: Normal heart sounds. No murmur heard.    No friction rub. No gallop.  Pulmonary:     Effort: Pulmonary effort is normal. No respiratory distress.     Breath sounds: Normal breath sounds. No wheezing or rales.  Chest:     Chest wall: No tenderness.  Abdominal:     General: Bowel sounds are normal. There  is no distension.     Palpations: Abdomen is soft. There is no mass.     Tenderness: There is no abdominal tenderness. There is no guarding or rebound.  Musculoskeletal:        General: No tenderness. Normal range of motion.     Cervical back: Normal range of motion.  Lymphadenopathy:     Cervical: No cervical adenopathy.  Skin:    General: Skin is warm and dry.     Findings: No rash.  Neurological:     Mental Status: He is alert and oriented to person, place, and time.     Cranial Nerves: No cranial nerve deficit.     Motor: No abnormal muscle tone.     Coordination: Coordination normal.     Gait: Gait normal.     Deep Tendon Reflexes: Reflexes are normal and symmetric.  Psychiatric:        Behavior: Behavior normal.        Thought Content: Thought content normal.        Judgment: Judgment normal.   Cholecystostomy drain On O2 R shoulder - sensitive w/ROM  Lab Results  Component Value Date   WBC 8.6 08/24/2022   HGB 13.4 08/24/2022   HCT 41.2 08/24/2022   PLT 280 08/24/2022   GLUCOSE 103 (H) 08/24/2022   CHOL 153 11/23/2018   TRIG 201.0 (H) 11/23/2018   HDL 38.20 (L) 11/23/2018   LDLDIRECT 99.0 11/23/2018   LDLCALC 80 11/07/2017   ALT 15 08/24/2022   AST 19 08/24/2022   NA 139 08/24/2022   K 4.0 08/24/2022   CL 103 08/24/2022   CREATININE 0.84 08/24/2022   BUN 7 (L) 08/24/2022   CO2 31 08/24/2022   TSH 2.689 08/24/2022   PSA 0.06 (L) 07/22/2021   INR 1.0 05/04/2022    IR EXCHANGE BILIARY DRAIN  Result Date: 09/07/2022 INDICATION: 77 year old male with history of calculus cholecystitis status post percutaneous cholecystostomy tube placement on 01/21/2022 and attempted percutaneous gallstone retrieval on 05/04/2022 which was unsuccessful. The patient has elected for chronic indwelling cholecystostomy tube and presents for routine exchange. EXAM: FLUOROSCOPIC GUIDED CHOLECYSTOSTOMY TUBE EXCHANGE COMPARISON:  None Available. MEDICATIONS: None ANESTHESIA/SEDATION:  None CONTRAST:  10 mL Omnipaque 300-administered into the gallbladder lumen. FLUOROSCOPY TIME:  Thirty-one mGy COMPLICATIONS: None immediate. PROCEDURE: The patient was positioned supine on the fluoroscopy table. The external portion of the existing cholecystostomy tube as well as the surrounding skin was prepped and draped in usual sterile fashion. A time-out was performed prior to the initiation of the procedure. A preprocedural spot fluoroscopic image was obtained of the right upper abdominal quadrant existing cholecystostomy tube. The skin surrounding the cholecystostomy tube was anesthetized with 1% lidocaine with epinephrine. The external portion of the cholecystostomy tube was cut and  cannulated with a short Amplatz wire which was advanced through the tube and coiled within the gallbladder lumen Next, under intermittent fluoroscopic guidance, the existing 10 French cholecystostomy tube was exchanged for a new, 8 [place] cholecystostomy tube which was repositioned into the more central aspect of the gallbladder lumen. Contrast injection confirms appropriate positioning and functionality of the cholecystomy tube. The cholecystostomy tube was flushed with a small amount of saline and reconnected to a gravity bag. The cholecystostomy tube was secured with an interrupted suture and a Stat Lock device. A dressing was applied. The patient tolerated the procedure well without immediate postprocedural complication. FINDINGS: Preprocedural spot fluoroscopic image demonstrates unchanged positioning of cholecystostomy tube with end coiled and locked over the expected location of the fundus of the gallbladder After fluoroscopic guided exchange, the new 8 [place] cholecystostomy tube is more ideally positioned with end coiled and locked within the central aspect of the gallbladder lumen. Post exchange cholangiogram demonstrates appropriate positioning and functionality of the new cholecystostomy tube. IMPRESSION: Successful  fluoroscopic guided exchange, repositioning and down sizing of now 8 French cholecystostomy tube. PLAN: Follow-up in 2-3 months for routine cholecystostomy tube exchange. Marliss Coots, MD [facility] Radiology Electronically Signed   By: Marliss Coots M.D.   On: 09/07/2022 16:27    Assessment & Plan:   Problem List Items Addressed This Visit     Essential hypertension    Hold Norvasc, Atenolol due to low BP      COPD mixed type (HCC) - Primary    On O2      CLL (chronic lymphocytic leukemia) (HCC)    St 0      Hypothyroidism    Abn TSH. Monitor FT3 and FT4 . 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     Follow-up: Return in about 3 months (around 01/05/2023) for a follow-up visit.  Sonda Primes, MD

## 2022-10-05 NOTE — Assessment & Plan Note (Signed)
Abn TSH. Monitor FT3 and FT4 . On Levothroid

## 2022-10-05 NOTE — Assessment & Plan Note (Signed)
Injection offered Blue-Emu cream was recommended to use 2-3 times a day

## 2022-10-05 NOTE — Assessment & Plan Note (Signed)
 On O2 

## 2022-10-05 NOTE — Assessment & Plan Note (Signed)
On Eliquis

## 2022-10-05 NOTE — Assessment & Plan Note (Signed)
Hold Norvasc, Atenolol due to low BP 

## 2022-10-05 NOTE — Assessment & Plan Note (Signed)
St 0

## 2022-10-05 NOTE — Assessment & Plan Note (Signed)
S/p cholecystostomy drain since Nov 2nd 2023

## 2022-10-05 NOTE — Assessment & Plan Note (Signed)
S/p cholecystostomy drain

## 2022-10-05 NOTE — Assessment & Plan Note (Signed)
F/u w/Dr Arbutus Ped, Dr Roselind Messier, Dr Thora Lance

## 2022-10-13 ENCOUNTER — Other Ambulatory Visit (HOSPITAL_COMMUNITY): Payer: Self-pay

## 2022-10-21 ENCOUNTER — Other Ambulatory Visit (HOSPITAL_COMMUNITY): Payer: Self-pay

## 2022-10-28 NOTE — Progress Notes (Signed)
Date:  10/29/2022   ID:  Joe [name], DOB 07-24-1945, MRN [id number]   PCP:  Tresa Garter, MD  Cardiologist: Armanda Magic, MD Electrophysiologist:  None   Chief Complaint:  HTN, PAF, OSA  History of Present Illness:    Joe [name] is a 77 y.o. male  with a hx of HTN.  He was seen by me in 2015 when he had an EKG that the PCP though was afib but after review there was no findings of afib on EKG. He saw Dr. Posey Rea in March and was placed on Atenolol 50mg  PRN for palpitations.    He underwent home sleep study which showed mild OSA with an AHI of 9.2/hr with nocturnal hypoxemia with O2 sat 75% and mild snoring.  He underwent CPAP titration to 9cm H2O.    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  He underwent XRT and tumor continues to shrink as much as 75%.  He has been txd with immunotherapy and chemo with Carboplatin and Paclitaxel.    He was dx with cholecystitis and underwent biliary drain as he was felt to be a poor candidate for surgery.  He is here today for followup and is doing well.   He has a He has chronic DOE due to COPD and uses O2 during the day.  Followed by Pulmonary and he is on 2L 24/7.  He has a biliary drain in place and is very frustrated that he cannot exercise right now.  He denies any chest pain or pressure, PND, orthopnea, LE edema, dizziness, or syncope. He has some SOB when he brings in his groceries which is stable.  He has noticed that he has elevated heart rate at times up to 120's but this is fairly rare and he has not had to take the Atenolol.  He is compliant with his meds and is tolerating meds with no SE.   He is doing well with his PAP device and thinks that he has gotten used to it.  He tolerates the mask and feels the pressure is adequate.   Since going on PAP He feels rested in the am and has no significant daytime sleepiness.  He denies any significant mouth or nasal dryness or nasal congestion.  He does not think that he snores.     Prior CV studies:   The following studies were reviewed today:   PAP compliance download from [employer], stress test, echo  Past Medical History:  Diagnosis Date   Alcohol abuse, daily use 08/24/2010   Stopped 2015    Aortic stenosis    mild with mean AVG by echo 5/24   Arthralgia 12/13/2013   9/15 Not related to statins OA    Ascending aorta dilatation (HCC)    42mm by echo 5/24   Cataract    Chronic lymphocytic leukemia (HCC) 07/18/2014   chronic stage 1- no symtoms   CLL (chronic lymphocytic leukemia) (HCC) 08/08/2014   2016 Dr Mosetta Putt Stage 0   COPD mixed type (HCC) 07/26/2007   Smoker - stopped 6/14    De Quervain's tenosynovitis, right 12/13/2013   2015    Depression    at times   Dysrhythmia    PAF   Dysuria 12/13/2013   9/15 - poss stricture Urol ref was  offered    Gallstones 11/16/2017   Asymptomatic Pt refused surg ref   Generalized anxiety disorder 09/07/2012   Chronic   Potential benefits of a long term steroid  use as well as potential risks  and complications were explained to the patient and were aknowledged.      GERD 12/02/2006   Chronic      Grade I diastolic dysfunction 01/20/2022   Grief 06/12/16   [name] died in 12/06/2014   Gynecomastia January 11, 2014   Benign B 2015    Hyperlipidemia    Hypertension    Hypothyroidism 12/25/2014   12/06/2014 On Levothyroxine    Intertrigo 02/02/2012   11/13    Neoplasm of uncertain behavior of skin 03/12/2013   12/14 R ear, chest    OSA on CPAP    mild with AHI 9/hr and oxygen desats as low as 75%   Paresis (HCC)    right- s/p cerv decompression   PERIORBITAL CELLULITIS 02/22/2009   Qualifier: Diagnosis of  By: Ermalene Searing MD, Amy     Retinal detachment    L>>R   Past Surgical History:  Procedure Laterality Date   BICEPS  TENDON REPAIR     BRONCHIAL BIOPSY  10/23/2019   Procedure: BRONCHIAL BIOPSIES;  Surgeon: Leslye Peer, MD;  Location: [facility];  Service: Pulmonary;;   BRONCHIAL BIOPSY  11/20/2019   Procedure: BRONCHIAL BIOPSIES;  Surgeon: Leslye Peer, MD;  Location: [facility];  Service: Pulmonary;;   BRONCHIAL BRUSHINGS  10/23/2019   Procedure: BRONCHIAL BRUSHINGS;  Surgeon: Leslye Peer, MD;  Location: [facility];  Service: Pulmonary;;   BRONCHIAL BRUSHINGS  11/20/2019   Procedure: BRONCHIAL BRUSHINGS;  Surgeon: Leslye Peer, MD;  Location: [facility];  Service: Pulmonary;;   BRONCHIAL NEEDLE ASPIRATION BIOPSY  10/23/2019   Procedure: BRONCHIAL NEEDLE ASPIRATION BIOPSIES;  Surgeon: Leslye Peer, MD;  Location: [facility];  Service: Pulmonary;;   BRONCHIAL NEEDLE ASPIRATION BIOPSY  11/20/2019   Procedure: BRONCHIAL NEEDLE ASPIRATION BIOPSIES;  Surgeon: Leslye Peer, MD;  Location: [facility];  Service: Pulmonary;;   BRONCHIAL WASHINGS  11/20/2019   Procedure: BRONCHIAL WASHINGS;  Surgeon: Leslye Peer, MD;  [facility];  Service: Pulmonary;;   CATARACT EXTRACTION Left    COLONOSCOPY  04-14-99   Dr Herma Ard polyp-TA in epic   IR BALLOON DILATION OF BILIARY DUCTS/AMPULLA  05/04/2022   IR CONVERT BILIARY DRAIN TO INT EXT BILIARY DRAIN  05/04/2022   IR CONVERT BILIARY DRAIN TO INT EXT BILIARY DRAIN  05/27/2022   IR EXCHANGE BILIARY DRAIN  02/02/2022   IR EXCHANGE BILIARY DRAIN  03/19/2022   IR EXCHANGE BILIARY DRAIN  07/13/2022   IR EXCHANGE BILIARY DRAIN  09/07/2022   IR PERC CHOLECYSTOSTOMY  01/21/2022   IR RADIOLOGIST EVAL & MGMT  04/08/2022   IR RADIOLOGIST EVAL & MGMT  05/17/2022   IR REMOVAL OF CALCULI/DEBRIS BILIARY DUCT/GB  05/04/2022   POLYPECTOMY  04-14-99   POSTERIOR LAMINECTOMY / DECOMPRESSION CERVICAL SPINE     Dr Wynetta Emery   RETINAL DETACHMENT SURGERY     left eye, 12/05/05 x2, 12-06-06 x 3   ROTATOR CUFF REPAIR  12/06/02   right   TONSILLECTOMY  9811,9147   VIDEO  BRONCHOSCOPY WITH ENDOBRONCHIAL NAVIGATION N/A 10/23/2019   Procedure: VIDEO BRONCHOSCOPY WITH ENDOBRONCHIAL NAVIGATION;  Surgeon: Leslye Peer, MD;  Location: [facility];  Service: Pulmonary;  Laterality: N/A;   VIDEO BRONCHOSCOPY WITH ENDOBRONCHIAL NAVIGATION N/A 11/20/2019   Procedure: VIDEO BRONCHOSCOPY WITH ENDOBRONCHIAL  NAVIGATION;  Surgeon: Leslye Peer, MD;  Location: [facility] ENDOSCOPY;  Service: Pulmonary;  Laterality: N/A;   VIDEO BRONCHOSCOPY WITH ENDOBRONCHIAL ULTRASOUND N/A 10/23/2019   Procedure: VIDEO BRONCHOSCOPY WITH ENDOBRONCHIAL ULTRASOUND;  Surgeon: Leslye Peer, MD;  Location: [facility];  Service: Pulmonary;  Laterality: N/A;     Current Meds  Medication Sig   albuterol (PROVENTIL) (2.5 MG/3ML) 0.083% nebulizer solution Take 3 mLs (2.5 mg total) by nebulization every 6 (six) hours as needed for wheezing or shortness of breath.   amLODipine (NORVASC) 5 MG tablet TAKE 1 TABLET BY MOUTH EVERY DAY   atenolol (TENORMIN) 25 MG tablet Take 1 tablet (25 mg total) by mouth daily as needed (palpitations).   Cholecalciferol 25 MCG (1000 UT) tablet Take 1,000 Units by mouth daily.   diazepam (VALIUM) 5 MG tablet Take 1 tablet (5 mg total) by mouth every 12 (twelve) hours as needed for anxiety.   levothyroxine (SYNTHROID) 137 MCG tablet Take 1 tablet (137 mcg total) by mouth daily before breakfast.   lovastatin (MEVACOR) 20 MG tablet TAKE 1 TABLET BY MOUTH EVERY NIGHT AT BEDTIME   Multiple Vitamin (MULTIVITAMIN) tablet Take 1 tablet by mouth daily. Centrum Silver.   Polyethyl Glycol-Propyl Glycol (SYSTANE ULTRA OP) Place 1 drop into both eyes 2 (two) times daily as needed (dry eyes).   Sodium Chloride Flush (NORMAL SALINE FLUSH) 0.9 % SOLN Flush gallbladder drain with 5 - 10 ml once daily   [DISCONTINUED] apixaban (ELIQUIS) 5 MG TABS tablet TAKE 1 TABLET(5 MG) BY MOUTH TWICE DAILY     Allergies:   Patient has no known allergies.   Social History   Tobacco Use   Smoking status:  Former    Current packs/day: 0.00    Average packs/day: 0.8 packs/day for 50.0 years (40.0 ttl pk-yrs)    Types: Cigarettes    Start date: 08/28/1962    Quit date: 08/27/2012    Years since quitting: 10.1   Smokeless tobacco: Never  Vaping Use   Vaping status: Never Used  Substance Use Topics   Alcohol use: Not Currently   Drug use: No     Family Hx: The patient's family history includes Breast cancer (age of onset: 65) in his paternal aunt; COPD in his mother; Coronary artery disease in an other family member; Diabetes in his father. There is no history of Colon cancer.  ROS:   Please see the history of present illness.     All other systems reviewed and are negative.   Labs/Other Tests and Data Reviewed:    EKG Interpretation Date/Time:  Friday October 29 2022 10:26:41 EDT Ventricular Rate:  67 PR Interval:  168 QRS Duration:  128 QT Interval:  424 QTC Calculation: 448 R Axis:   71  Text Interpretation: Normal sinus rhythm Right bundle branch block When compared with ECG of 09-Feb-2021 10:49, PREVIOUS ECG IS PRESENT Right bundle branch block is new Confirmed by Armanda Magic ([id number]) on 10/29/2022 10:45:26 AM    Recent Labs: 01/20/2022: Magnesium 2.1 08/24/2022: ALT 15; BUN 7; Creatinine 0.84; Hemoglobin 13.4; Platelet Count 280; Potassium 4.0; Sodium 139; TSH 2.689   Recent Lipid Panel Lab Results  Component Value Date/Time   CHOL 153 11/23/2018 10:35 AM   TRIG 201.0 (H) 11/23/2018 10:35 AM   HDL 38.20 (L) 11/23/2018 10:35 AM   CHOLHDL 4 11/23/2018 10:35 AM   LDLCALC 80 11/07/2017 08:59 AM   LDLDIRECT 99.0 11/23/2018 10:35 AM    Wt Readings from Last 3 Encounters:  10/29/22 178 lb (80.7 kg)  10/05/22 180 lb (81.6 kg)  08/30/22 179 lb (81.2 kg)     Objective:    Vital Signs:  BP 112/70   Pulse 67   Ht 5\' 11"  (1.803 m)   Wt 178 lb (80.7 kg)   SpO2 93%   BMI 24.83 kg/m    GEN: Well nourished, well developed in no acute distress HEENT: Normal NECK: No JVD;  No carotid bruits LYMPHATICS: No lymphadenopathy CARDIAC:RRR, no murmurs, rubs, gallops RESPIRATORY:  Clear to auscultation without rales, wheezing or rhonchi  ABDOMEN: Soft, non-tender, non-distended MUSCULOSKELETAL:  No edema; No deformity  SKIN: Warm and dry NEUROLOGIC:  Alert and oriented x 3 PSYCHIATRIC:  Normal affect  ASSESSMENT & PLAN:    1. OSA - The patient is tolerating PAP therapy well without any problems. The PAP download performed by his DME was personally reviewed and interpreted by me today and showed an AHI of 0.1 /hr on 9 cm H2O with 100 % compliance in using more than 4 hours nightly.  The patient has been using and benefiting from PAP use and will continue to benefit from therapy.   2.  HTN -BP is controlled on exam today -Continue drug managed with amlodipine 5 mg daily with as needed refills  3.  PAF -He remains in normal sinus rhythm on exam denies any palpitations -He denies any bleeding problems on DOAC -Continue prescription drug management with apixaban 5 mg twice daily and as needed atenolol for breakthrough palpitations with as needed refills -I have personally reviewed and interpreted outside labs performed by patient's PCP which showed SCr 0.84 and K+ 4, Hbg 13.4 on 08/24/22  4.  Aortic insufficiency/aortic stenosis -mild to moderate AI by echo 02/2021 -Mild AI and mild to moderate aortic stenosis by echo 08/03/2022 with mean aortic valve gradient 13 mmHg and DVI 0.38, V-max 2.48 m/s -Repeat 2D echo 07/2023  5.  Coronary artery calcifications -noted on chest CT -no ischemic on stress test 2022 -Iexiscan myoview no ischemia 2022 -He denies any anginal symptoms  6.  Bradycardia -had been transient and noted during lung CA treatment and found to be hypothyroid -on Synthroid with resolution  7.  Ascending aortic dilatation -42mm on echo 02/2021 and 41mm on Chest CT 06/2021  -38 mm on chest CT 08/2022 -42 mm on echo 07/2022 -Repeat 2D echo  07/2023  Medication Adjustments/Labs and Tests Ordered: Current medicines are reviewed at length with the patient today.  Concerns regarding medicines are outlined above.  Tests Ordered: Orders Placed This Encounter  Procedures   EKG 12-Lead   Medication Changes: Meds ordered this encounter  Medications   apixaban (ELIQUIS) 5 MG TABS tablet    Sig: Take 1 tablet (5 mg total) by mouth 2 (two) times daily.    Dispense:  60 tablet    Refill:  11    90 day supply    Disposition:  Follow up in 1 year(s)  Signed, Armanda Magic, MD  10/29/2022 10:46 AM    [facility]

## 2022-10-29 ENCOUNTER — Other Ambulatory Visit: Payer: Self-pay | Admitting: Cardiology

## 2022-10-29 ENCOUNTER — Ambulatory Visit: Payer: Medicare Other | Admitting: Cardiology

## 2022-10-29 ENCOUNTER — Encounter: Payer: Self-pay | Admitting: Cardiology

## 2022-10-29 VITALS — BP 112/70 | HR 67 | Ht 71.0 in | Wt 178.0 lb

## 2022-10-29 DIAGNOSIS — I10 Essential (primary) hypertension: Secondary | ICD-10-CM | POA: Diagnosis not present

## 2022-10-29 DIAGNOSIS — I350 Nonrheumatic aortic (valve) stenosis: Secondary | ICD-10-CM

## 2022-10-29 DIAGNOSIS — I77810 Thoracic aortic ectasia: Secondary | ICD-10-CM | POA: Diagnosis not present

## 2022-10-29 DIAGNOSIS — I2510 Atherosclerotic heart disease of native coronary artery without angina pectoris: Secondary | ICD-10-CM

## 2022-10-29 DIAGNOSIS — G4733 Obstructive sleep apnea (adult) (pediatric): Secondary | ICD-10-CM | POA: Diagnosis not present

## 2022-10-29 DIAGNOSIS — I351 Nonrheumatic aortic (valve) insufficiency: Secondary | ICD-10-CM | POA: Diagnosis not present

## 2022-10-29 DIAGNOSIS — I480 Paroxysmal atrial fibrillation: Secondary | ICD-10-CM | POA: Diagnosis not present

## 2022-10-29 DIAGNOSIS — R001 Bradycardia, unspecified: Secondary | ICD-10-CM | POA: Insufficient documentation

## 2022-10-29 MED ORDER — APIXABAN 5 MG PO TABS
5.0000 mg | ORAL_TABLET | Freq: Two times a day (BID) | ORAL | 11 refills | Status: DC
Start: 2022-10-29 — End: 2023-11-04

## 2022-10-29 NOTE — Addendum Note (Signed)
Addended by: Luellen Pucker on: 10/29/2022 10:58 AM   Modules accepted: Orders

## 2022-10-29 NOTE — Patient Instructions (Signed)
Medication Instructions:  Your physician recommends that you continue on your current medications as directed. Please refer to the Current Medication list given to you today.  *If you need a refill on your cardiac medications before your next appointment, please call your pharmacy*   Lab Work: None.  If you have labs (blood work) drawn today and your tests are completely normal, you will receive your results only by: MyChart Message (if you have MyChart) OR A paper copy in the mail If you have any lab test that is abnormal or we need to change your treatment, we will call you to review the results.   Testing/Procedures: Your physician has requested that you have an echocardiogram in May 2025. Echocardiography is a painless test that uses sound waves to create images of your heart. It provides your doctor with information about the size and shape of your heart and how well your heart's chambers and valves are working. This procedure takes approximately one hour. There are no restrictions for this procedure. Please do NOT wear cologne, perfume, aftershave, or lotions (deodorant is allowed). Please arrive 15 minutes prior to your appointment time.    Follow-Up: At [facility], you and your health needs are our priority.  As part of our continuing mission to provide you with exceptional heart care, we have created designated Provider Care Teams.  These Care Teams include your primary Cardiologist (physician) and Advanced Practice Providers (APPs -  Physician Assistants and Nurse Practitioners) who all work together to provide you with the care you need, when you need it.  We recommend signing up for the patient portal called "MyChart".  Sign up information is provided on this After Visit Summary.  MyChart is used to connect with patients for Virtual Visits (Telemedicine).  Patients are able to view lab/test results, encounter notes, upcoming appointments, etc.  Non-urgent messages can  be sent to your provider as well.   To learn more about what you can do with MyChart, go to [email or web address].    Your next appointment:   1 year(s)  Provider:   Dr. Armanda Magic, MD

## 2022-11-04 ENCOUNTER — Other Ambulatory Visit: Payer: Self-pay | Admitting: Physician Assistant

## 2022-11-04 DIAGNOSIS — E039 Hypothyroidism, unspecified: Secondary | ICD-10-CM

## 2022-11-30 ENCOUNTER — Other Ambulatory Visit (HOSPITAL_COMMUNITY): Payer: Self-pay | Admitting: Interventional Radiology

## 2022-11-30 ENCOUNTER — Ambulatory Visit (HOSPITAL_COMMUNITY)
Admission: RE | Admit: 2022-11-30 | Discharge: 2022-11-30 | Disposition: A | Discharge status: Home / Self Care | Payer: Medicare Other | Source: Ambulatory Visit | Attending: Interventional Radiology | Admitting: Interventional Radiology

## 2022-11-30 DIAGNOSIS — K8000 Calculus of gallbladder with acute cholecystitis without obstruction: Secondary | ICD-10-CM

## 2022-11-30 DIAGNOSIS — Z434 Encounter for attention to other artificial openings of digestive tract: Secondary | ICD-10-CM | POA: Diagnosis not present

## 2022-11-30 HISTORY — PX: IR EXCHANGE BILIARY DRAIN: IMG6046

## 2022-11-30 MED ORDER — LIDOCAINE HCL 1 % IJ SOLN
INTRAMUSCULAR | Status: AC
  Filled 2022-11-30: qty 20

## 2022-11-30 MED ORDER — IOHEXOL 300 MG/ML  SOLN
50.0000 mL | Freq: Once | INTRAMUSCULAR | Status: AC | PRN
  Administered 2022-11-30: 15 mL

## 2022-11-30 MED ORDER — LIDOCAINE HCL (PF) 1 % IJ SOLN
20.0000 mL | Freq: Once | INTRAMUSCULAR | Status: AC
  Administered 2022-11-30: 5 mL via INTRADERMAL

## 2022-12-13 ENCOUNTER — Other Ambulatory Visit (HOSPITAL_COMMUNITY): Payer: Self-pay

## 2022-12-16 ENCOUNTER — Ambulatory Visit: Payer: Medicare Other

## 2022-12-16 VITALS — BP 121/66 | HR 60 | Ht 71.0 in | Wt 179.0 lb

## 2022-12-16 DIAGNOSIS — Z0000 Encounter for general adult medical examination without abnormal findings: Secondary | ICD-10-CM

## 2022-12-16 NOTE — Progress Notes (Addendum)
Subjective:   Joe [name] is a 77 y.o. male who presents for Medicare Annual/Subsequent preventive examination.  Visit Complete: Virtual  I connected with  Joe [name] on 12/16/22 by a audio enabled telemedicine application and verified that I am speaking with the correct person using two identifiers.  Patient Location: Home  Provider Location: Office/Clinic  I discussed the limitations of evaluation and management by telemedicine. The patient expressed understanding and agreed to proceed.  Because this visit was a virtual/telehealth visit, some criteria may be missing or patient reported.      Cardiac Risk Factors include: advanced age (>42men, >29 women);male gender;dyslipidemia;hypertension;Other (see comment), Risk factor comments: A-fib, COPD, OSA     Objective:    Today's Vitals   12/16/22 1258  BP: 121/66  Pulse: 60  SpO2: 97%  Weight: 179 lb (81.2 kg)  Height: 5\' 11"  (1.803 m)   Body mass index is 24.97 kg/m.     12/16/2022    1:18 PM 05/27/2022    9:31 AM 05/04/2022    7:44 AM 01/20/2022    4:42 PM 01/20/2022   10:02 AM 02/09/2021    3:47 PM 01/19/2021    9:51 AM  Advanced Directives  Does Patient Have a Medical Advance Directive? Yes Yes Yes Yes No Yes Yes  Type of Estate agent of [place];Living will Healthcare Power of [place];Living will Healthcare Power of [employer] Power of [employer] Power of [employer] Power of [place];Living will  Does patient want to make changes to medical advance directive?  No - Patient declined No - Patient declined No - Patient declined  No - Patient declined   Copy of Healthcare Power of Attorney in Chart? No - copy requested No - copy requested No - copy requested   No - copy requested No - copy requested  Would patient like information on creating a medical advance directive?    No - Patient declined No - Patient declined No - Patient declined No - Patient declined     Current Medications (verified) Outpatient Encounter Medications as of 12/16/2022  Medication Sig   amLODipine (NORVASC) 5 MG tablet TAKE 1 TABLET BY MOUTH EVERY DAY   apixaban (ELIQUIS) 5 MG TABS tablet Take 1 tablet (5 mg total) by mouth 2 (two) times daily.   atenolol (TENORMIN) 25 MG tablet Take 1 tablet (25 mg total) by mouth daily as needed (palpitations).   Cholecalciferol 25 MCG (1000 UT) tablet Take 1,000 Units by mouth daily.   diazepam (VALIUM) 5 MG tablet Take 1 tablet (5 mg total) by mouth every 12 (twelve) hours as needed for anxiety.   levothyroxine (SYNTHROID) 137 MCG tablet TAKE 1 TABLET(137 MCG) BY MOUTH DAILY BEFORE BREAKFAST   lovastatin (MEVACOR) 20 MG tablet TAKE 1 TABLET BY MOUTH EVERY NIGHT AT BEDTIME   Multiple Vitamin (MULTIVITAMIN) tablet Take 1 tablet by mouth daily. Centrum Silver.   Polyethyl Glycol-Propyl Glycol (SYSTANE ULTRA OP) Place 1 drop into both eyes 2 (two) times daily as needed (dry eyes).   Sodium Chloride Flush (NORMAL SALINE FLUSH) 0.9 % SOLN Flush gallbladder drain with 5 - 10 ml once daily   albuterol (PROVENTIL) (2.5 MG/3ML) 0.083% nebulizer solution Take 3 mLs (2.5 mg total) by nebulization every 6 (six) hours as needed for wheezing or shortness of breath. (Patient not taking: Reported on 12/16/2022)   No facility-administered encounter medications on file as of 12/16/2022.    Allergies (verified) Patient has no known allergies.  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   Past Surgical History:  Procedure Laterality Date   BICEPS TENDON REPAIR     BRONCHIAL BIOPSY  10/23/2019   Procedure: BRONCHIAL BIOPSIES;  Surgeon: Leslye Peer, MD;  Location: [facility] ENDOSCOPY;  Service: Pulmonary;;   BRONCHIAL BIOPSY  11/20/2019   Procedure: BRONCHIAL BIOPSIES;  Surgeon: Leslye Peer, MD;  Location: [facility] ENDOSCOPY;  Service: Pulmonary;;   BRONCHIAL BRUSHINGS  10/23/2019   Procedure: BRONCHIAL BRUSHINGS;  Surgeon: Leslye Peer, MD;  Location[facility] END[facility]COPY;  Service: Pulmonary;;   BRONCHIAL BRUSHINGS  11/20/2019   Procedure: BRONCHIAL BRUSHINGS;  Surgeon: Leslye Peer, MD;  Location: [facility] ENDOSCOPY;  Service: Pulmonary;;   BRONCHIAL NEEDLE ASPIRATION BIOPSY  10/23/2019   Procedure: BRONCHIAL NEEDLE ASPIRATION BIOPSIES;  Surgeon: Leslye Peer, MD;  Location: [facility] ENDOSCOPY;  Service: Pulmonary;;   BRONCHIAL NEEDLE ASPIRATION BIOPSY  11/20/2019   Procedure:  BRONCHIAL NEEDLE ASPIRATION BIOPSIES;  Surgeon: Leslye Peer, MD;  Location: [facility] ENDOSCOPY;  Service: Pulmonary;;   BRONCHIAL WASHINGS  11/20/2019   Procedure: BRONCHIAL WASHINGS;  Surgeon: Leslye Peer, MD;  Location: [facility] ENDOSCOPY;  Service: Pulmonary;;   CATARACT EXTRACTION Left    COLONOSCOPY  04-14-99   Dr Herma Ard polyp-TA in epic   IR BALLOON DILATION OF BILIARY DUCTS/AMPULLA  05/04/2022   IR CONVERT BILIARY DRAIN TO INT EXT BILIARY DRAIN  05/04/2022   IR CONVERT BILIARY DRAIN TO INT EXT BILIARY DRAIN  05/27/2022   IR EXCHANGE BILIARY DRAIN  02/02/2022   IR EXCHANGE BILIARY DRAIN  03/19/2022   IR EXCHANGE  BILIARY DRAIN  07/13/2022   IR EXCHANGE BILIARY DRAIN  09/07/2022   IR EXCHANGE BILIARY DRAIN  11/30/2022   IR PERC CHOLECYSTOSTOMY  01/21/2022   IR RADIOLOGIST EVAL & MGMT  04/08/2022   IR RADIOLOGIST EVAL & MGMT  05/17/2022   IR REMOVAL OF CALCULI/DEBRIS BILIARY DUCT/GB  05/04/2022   POLYPECTOMY  04-14-99   POSTERIOR LAMINECTOMY / DECOMPRESSION CERVICAL SPINE     Dr Wynetta Emery   RETINAL DETACHMENT SURGERY     left eye, 2007 x2, 2008 x 3   ROTATOR CUFF REPAIR  2004   right   TONSILLECTOMY  [id number]   VIDEO BRONCHOSCOPY WITH ENDOBRONCHIAL NAVIGATION N/A 10/23/2019   Procedure: VIDEO BRONCHOSCOPY WITH ENDOBRONCHIAL NAVIGATION;  Surgeon: Leslye Peer, MD;  Location: [facility] ENDOSCOPY;  Service: Pulmonary;  Laterality: N/A;   VIDEO BRONCHOSCOPY WITH ENDOBRONCHIAL NAVIGATION N/A 11/20/2019   Procedure: VIDEO BRONCHOSCOPY WITH ENDOBRONCHIAL NAVIGATION;  Surgeon: Leslye Peer, MD;  Location: [facility] ENDOSCOPY;  Service: Pulmonary;  Laterality: N/A;   VIDEO BRONCHOSCOPY WITH ENDOBRONCHIAL ULTRASOUND N/A 10/23/2019   Procedure: VIDEO BRONCHOSCOPY WITH ENDOBRONCHIAL ULTRASOUND;  Surgeon: Leslye Peer, MD;  Location: [facility] ENDOSCOPY;  Service: Pulmonary;  Laterality: N/A;   Family History  Problem Relation Age of Onset   COPD Mother    Diabetes Father    Coronary artery disease Other    Breast cancer  Paternal Aunt 24   Colon cancer Neg Hx    Social History   Socioeconomic History   Marital status: Widowed    Spouse name: Not on file   Number of children: 1   Years of education: Not on file   Highest education level: Not on file  Occupational History   Occupation: retired  Tobacco Use   Smoking status: Former    Current packs/day: 0.00    Average packs/day: 0.8 packs/day for 50.0 years (40.0 ttl pk-yrs)    Types: Cigarettes    Start date: 08/28/1962    Quit date: 08/27/2012    Years since quitting: 10.3   Smokeless tobacco: Never  Vaping Use   Vaping status: Never Used  Substance and Sexual Activity   Alcohol use: Not Currently   Drug use: No   Sexual activity: Not Currently  Other Topics Concern   Not on file  Social History Narrative   Lives alone   Social Determinants of Health   Financial Resource Strain: Low Risk  (12/16/2022)   Overall Financial Resource Strain (CARDIA)    Difficulty of Paying Living Expenses: Not hard at all  Food Insecurity: No Food Insecurity (12/16/2022)   Hunger Vital Sign    Worried About Running Out of Food in the Last Year: Never true    Ran Out of Food in the Last Year: Never true  Transportation Needs: No Transportation Needs (12/16/2022)   PRAPARE - Administrator, Civil Service (Medical): No    Lack of Transportation (Non-Medical): No  Physical Activity: Inactive (12/16/2022)   Exercise Vital Sign    Days of Exercise per Week: 0 days    Minutes of Exercise per Session: 0 min  Stress: No Stress Concern Present (12/16/2022)   [employer] of Occupational Health - Occupational Stress Questionnaire    Feeling of Stress : Not at all  Social Connections: Moderately Integrated (12/16/2022)   Social Connection and Isolation Panel [NHANES]    Frequency of Communication with Friends and Family: More than three times a week    Frequency of Social Gatherings with Friends and  Family: More than three times a week    Attends  Religious Services: More than 4 times per year    Active Member of Clubs or Organizations: Yes    Attends Banker Meetings: 1 to 4 times per year    Marital Status: Widowed    Tobacco Counseling Counseling given: Not Answered   Clinical Intake:  Pre-visit preparation completed: Yes  Pain : No/denies pain     BMI - recorded: 24.97  How often do you need to have someone help you when you read instructions, pamphlets, or other written materials from your doctor or pharmacy?: 1 - Never  Interpreter Needed?: No  Information entered by :: Chrissa Meetze, RMA   Activities of Daily Living    12/16/2022   12:59 PM 01/20/2022    4:48 PM  In your present state of health, do you have any difficulty performing the following activities:  Hearing? 1   Comment Slight lt ear hearing loss.   Vision? 0   Difficulty concentrating or making decisions? 0   Walking or climbing stairs? 0   Dressing or bathing? 0   Doing errands, shopping? 0 1  Preparing Food and eating ? N   Using the Toilet? N   In the past six months, have you accidently leaked urine? N   Do you have problems with loss of bowel control? N   Managing your Medications? N   Managing your Finances? N   Housekeeping or managing your Housekeeping? N     Patient Care Team: [name], [name], MD as PCP - General Malachy Mood, MD as Consulting Physician (Hematology) Quintella Reichert, MD as Consulting Physician (Cardiology) Sherrie George, MD as Consulting Physician (Ophthalmology) Syliva Overman, RN as Oncology Nurse Navigator Meier, Ivor Costa, MD (Inactive) as Consulting Physician (Pulmonary Disease) [place], Heather, OD (Optometry)  Indicate any recent Medical Services you may have received from other than [facility] providers in the past year (date may be approximate).     Assessment:   This is a routine wellness examination for Meril.  Hearing/Vision screen Hearing Screening - Comments:: Lt ear slight  hearing loss Vision Screening - Comments:: Wears eyeglasses   Goals Addressed   None   Depression Screen    12/16/2022    1:27 PM 07/06/2022    1:19 PM 04/06/2022    1:18 PM 09/23/2021    2:12 PM 04/29/2021    9:30 AM 08/25/2020    8:54 AM 02/25/2020   10:31 AM  PHQ 2/9 Scores  PHQ - 2 Score 1 0 0 0 0 0 0  PHQ- 9 Score 2   0       Fall Risk    12/16/2022    1:18 PM 07/06/2022    1:19 PM 04/06/2022    1:17 PM 02/01/2022    9:58 AM 09/23/2021    2:14 PM  Fall Risk   Falls in the past year? 0 0 0 0 0  Number falls in past yr: 0 0 0 0 0  Injury with Fall? 0 0 0 0 0  Risk for fall due to : No Fall Risks No Fall Risks No Fall Risks No Fall Risks;History of fall(s)   Follow up Falls evaluation completed;Falls prevention discussed Falls evaluation completed Falls evaluation completed Falls evaluation completed     MEDICARE RISK AT HOME: Medicare Risk at Home Any stairs in or around the home?: No Home free of loose throw rugs in walkways, pet beds, electrical  cords, etc?: Yes Adequate lighting in your home to reduce risk of falls?: Yes Life alert?: No Use of a cane, walker or w/c?: Yes (cane) Grab bars in the bathroom?: No Shower chair or bench in shower?: Yes Elevated toilet seat or a handicapped toilet?: No  TIMED UP AND GO:  Was the test performed?  No    Cognitive Function:    06/26/2015   10:25 AM  MMSE - Mini Mental State Exam  Not completed: --        12/16/2022    1:20 PM 09/23/2021    2:14 PM  6CIT Screen  What Year? 0 points 0 points  What month? 0 points 0 points  What time? 0 points 0 points  Count back from 20 0 points 0 points  Months in reverse 0 points 0 points  Repeat phrase 0 points 0 points  Total Score 0 points 0 points    Immunizations Immunization History  Administered Date(s) Administered   Fluad Quad(high Dose 65+) 11/23/2018, 12/24/2019, 12/24/2020, 01/05/2022   Influenza Split 02/02/2012   Influenza Whole 12/23/2005   Influenza, High Dose  Seasonal PF 12/11/2015, 12/23/2016, 12/29/2017   Influenza,inj,Quad PF,6+ Mos 12/13/2012, 12/13/2013, 12/25/2014   PFIZER(Purple Top)SARS-COV-2 Vaccination 05/20/2019, 06/13/2019, 01/09/2020   Pfizer Covid-19 Vaccine Bivalent Booster 8yrs & up 01/13/2021   Pneumococcal Conjugate-13 03/12/2013   Pneumococcal Polysaccharide-23 06/12/2013   Td 05/14/2009   Tdap 11/27/2019    TDAP status: Up to date  Flu Vaccine status: Due, Education has been provided regarding the importance of this vaccine. Advised may receive this vaccine at local pharmacy or Health Dept. Aware to provide a copy of the vaccination record if obtained from local pharmacy or Health Dept. Verbalized acceptance and understanding.  Pneumococcal vaccine status: Up to date  Covid-19 vaccine status: Information provided on how to obtain vaccines.   Qualifies for Shingles Vaccine? Yes   Zostavax completed No   Shingrix Completed?: No.    Education has been provided regarding the importance of this vaccine. Patient has been advised to call insurance company to determine out of pocket expense if they have not yet received this vaccine. Advised may also receive vaccine at local pharmacy or Health Dept. Verbalized acceptance and understanding.  Screening Tests Health Maintenance  Topic Date Due   INFLUENZA VACCINE  10/21/2022   COVID-19 Vaccine (5 - 2023-24 season) 11/21/2022   Colonoscopy  12/16/2023 (Originally 09/12/2019)   Medicare Annual Wellness (AWV)  12/16/2023   DTaP/Tdap/Td (3 - Td or Tdap) 11/26/2029   Pneumonia Vaccine 43+ Years old  Completed   Hepatitis C Screening  Completed   HPV VACCINES  Aged Out   Zoster Vaccines- Shingrix  Discontinued    Health Maintenance  Health Maintenance Due  Topic Date Due   INFLUENZA VACCINE  10/21/2022   COVID-19 Vaccine (5 - 2023-24 season) 11/21/2022    Colorectal cancer screening: Type of screening: Colonoscopy. Completed 09/12/2014. Repeat every 5 years  Lung Cancer  Screening: (Low Dose CT Chest recommended if Age 27-80 years, 20 pack-year currently smoking OR have quit w/in 15years.) does qualify.   Lung Cancer Screening  05/17/2022  Additional Screening:  Hepatitis C Screening: does qualify; Completed 07/18/2014  Vision Screening: Recommended annual ophthalmology exams for early detection of glaucoma and other disorders of the eye. Is the patient up to date with their annual eye exam?  Yes  Who is the provider or what is the name of the office in which the patient attends annual eye exams?  Dr. Ashley Royalty If pt is not established with a provider, would they like to be referred to a provider to establish care? No .   Dental Screening: Recommended annual dental exams for proper oral hygiene   Community Resource Referral / Chronic Care Management: CRR required this visit?  No   CCM required this visit?  No     Plan:     I have personally reviewed and noted the following in the patient's chart:   Medical and social history Use of alcohol, tobacco or illicit drugs  Current medications and supplements including opioid prescriptions. Patient is not currently taking opioid prescriptions. Functional ability and status Nutritional status Physical activity Advanced directives List of other physicians Hospitalizations, surgeries, and ER visits in previous 12 months Vitals Screenings to include cognitive, depression, and falls Referrals and appointments  In addition, I have reviewed and discussed with patient certain preventive protocols, quality metrics, and best practice recommendations. A written personalized care plan for preventive services as well as general preventive health recommendations were provided to patient.     Quintana Canelo L Jahmel Flannagan, CMA   12/16/2022   After Visit Summary: (Mail) Due to this being a telephonic visit, the after visit summary with patients personalized plan was offered to patient via mail   Nurse Notes: Patient is due for  a Flu and Covid vaccine.  Patient stated that he would like to talk to Dr. Posey Rea during his next visit, about the Covid vaccine.  He had no other concerns to address today.  Patient would like to wait to schedule for next year's AWV.  Medical screening examination/treatment/procedure(s) were performed by non-physician practitioner and as supervising physician I was immediately available for consultation/collaboration.  I agree with above. Jacinta Shoe, MD

## 2022-12-16 NOTE — Patient Instructions (Signed)
Joe [name] , Thank you for taking time to come for your Medicare Wellness Visit. I appreciate your ongoing commitment to your health goals. Please review the following plan we discussed and let me know if I can assist you in the future.   Referrals/Orders/Follow-Ups/Clinician Recommendations: You are due for a Flu and Covid vaccine.  It was nice to speak with you today.  Each day, aim for 6 glasses of water, plenty of protein in your diet and try to get up and walk/ stretch every hour for 5-10 minutes at a time.    This is a list of the screening recommended for you and due dates:  Health Maintenance  Topic Date Due   Flu Shot  10/21/2022   COVID-19 Vaccine (5 - 2023-24 season) 11/21/2022   Colon Cancer Screening  12/16/2023*   Medicare Annual Wellness Visit  12/16/2023   DTaP/Tdap/Td vaccine (3 - Td or Tdap) 11/26/2029   Pneumonia Vaccine  Completed   Hepatitis C Screening  Completed   HPV Vaccine  Aged Out   Zoster (Shingles) Vaccine  Discontinued  *Topic was postponed. The date shown is not the original due date.    Advanced directives: (Copy Requested) Please bring a copy of your health care power of attorney and living will to the office to be added to your chart at your convenience.  Next Medicare Annual Wellness Visit scheduled for next year: No

## 2023-01-04 NOTE — Telephone Encounter (Signed)
TC

## 2023-01-05 ENCOUNTER — Ambulatory Visit: Payer: Medicare Other | Admitting: Internal Medicine

## 2023-01-05 ENCOUNTER — Encounter: Payer: Self-pay | Admitting: Internal Medicine

## 2023-01-05 VITALS — BP 118/68 | HR 62 | Temp 98.3°F | Ht 71.0 in | Wt 183.0 lb

## 2023-01-05 DIAGNOSIS — C9110 Chronic lymphocytic leukemia of B-cell type not having achieved remission: Secondary | ICD-10-CM | POA: Diagnosis not present

## 2023-01-05 DIAGNOSIS — K8020 Calculus of gallbladder without cholecystitis without obstruction: Secondary | ICD-10-CM | POA: Diagnosis not present

## 2023-01-05 DIAGNOSIS — I10 Essential (primary) hypertension: Secondary | ICD-10-CM

## 2023-01-05 DIAGNOSIS — R269 Unspecified abnormalities of gait and mobility: Secondary | ICD-10-CM | POA: Diagnosis not present

## 2023-01-05 DIAGNOSIS — C3492 Malignant neoplasm of unspecified part of left bronchus or lung: Secondary | ICD-10-CM | POA: Diagnosis not present

## 2023-01-05 NOTE — Assessment & Plan Note (Signed)
Cholecystostomy drain in the RUQ

## 2023-01-05 NOTE — Assessment & Plan Note (Addendum)
F/u w/Dr Arbutus Ped, Dr Roselind Messier, Dr Thora Lance Chest CT is due in Dec 2024

## 2023-01-05 NOTE — Assessment & Plan Note (Signed)
Monitoring CBC 

## 2023-01-05 NOTE — Assessment & Plan Note (Signed)
Chronic due to spine surgery complications remote Use a [employer]

## 2023-01-05 NOTE — Assessment & Plan Note (Signed)
Hold Norvasc, Atenolol due to low BP

## 2023-01-05 NOTE — Progress Notes (Signed)
Subjective:  Patient ID: CAMBRYN LEAZENBY, male    DOB: [birth year]  Age: 77 y.o. MRN: [id number]  CC: Follow-up (3 MNTH F/U)   HPI DAMARIS REHL presents for lung cancer - Chest CT is due in Dec 2024, COPD, Cholecystostomy drain     Outpatient Medications Prior to Visit  Medication Sig Dispense Refill   amLODipine (NORVASC) 5 MG tablet TAKE 1 TABLET BY MOUTH EVERY DAY 90 tablet 1   apixaban (ELIQUIS) 5 MG TABS tablet Take 1 tablet (5 mg total) by mouth 2 (two) times daily. 60 tablet 11   atenolol (TENORMIN) 25 MG tablet Take 1 tablet (25 mg total) by mouth daily as needed (palpitations). 90 tablet 0   Cholecalciferol 25 MCG (1000 UT) tablet Take 1,000 Units by mouth daily.     diazepam (VALIUM) 5 MG tablet Take 1 tablet (5 mg total) by mouth every 12 (twelve) hours as needed for anxiety. 180 tablet 1   levothyroxine (SYNTHROID) 137 MCG tablet TAKE 1 TABLET(137 MCG) BY MOUTH DAILY BEFORE BREAKFAST 30 tablet 2   lovastatin (MEVACOR) 20 MG tablet TAKE 1 TABLET BY MOUTH EVERY NIGHT AT BEDTIME 90 tablet 2   Multiple Vitamin (MULTIVITAMIN) tablet Take 1 tablet by mouth daily. Centrum Silver.     Polyethyl Glycol-Propyl Glycol (SYSTANE ULTRA OP) Place 1 drop into both eyes 2 (two) times daily as needed (dry eyes).     Sodium Chloride Flush (NORMAL SALINE FLUSH) 0.9 % SOLN Flush gallbladder drain with 5 - 10 ml once daily 560 mL 2   albuterol (PROVENTIL) (2.5 MG/3ML) 0.083% nebulizer solution Take 3 mLs (2.5 mg total) by nebulization every 6 (six) hours as needed for wheezing or shortness of breath. (Patient not taking: Reported on 12/16/2022) 75 mL 12   No facility-administered medications prior to visit.    ROS: Review of Systems  Constitutional:  Positive for fatigue. Negative for appetite change and unexpected weight change.  HENT:  Negative for congestion, nosebleeds, sneezing, sore throat and trouble swallowing.   Eyes:  Negative for itching and visual disturbance.  Respiratory:   Negative for cough.   Cardiovascular:  Negative for chest pain, palpitations and leg swelling.  Gastrointestinal:  Negative for abdominal distention, blood in stool, diarrhea and nausea.  Genitourinary:  Negative for frequency and hematuria.  Musculoskeletal:  Positive for arthralgias and gait problem. Negative for back pain, joint swelling and neck pain.  Skin:  Negative for rash.  Neurological:  Positive for weakness. Negative for dizziness, tremors and speech difficulty.  Psychiatric/Behavioral:  Negative for agitation, dysphoric mood, sleep disturbance and suicidal ideas. The patient is not nervous/anxious.     Objective:  BP 118/68 (BP Location: Right Arm, Patient Position: Sitting, Cuff Size: Normal)   Pulse 62   Temp 98.3 F (36.8 C) (Oral)   Ht 5\' 11"  (1.803 m)   Wt 183 lb (83 kg)   SpO2 91%   BMI 25.52 kg/m   BP Readings from Last 3 Encounters:  01/05/23 118/68  12/16/22 121/66  10/29/22 112/70    Wt Readings from Last 3 Encounters:  01/05/23 183 lb (83 kg)  12/16/22 179 lb (81.2 kg)  10/29/22 178 lb (80.7 kg)    Physical Exam Constitutional:      General: He is not in acute distress.    Appearance: He is well-developed. He is obese.     Comments: NAD  Eyes:     Conjunctiva/sclera: Conjunctivae normal.     Pupils: Pupils are equal,  round, and reactive to light.  Neck:     Thyroid: No thyromegaly.     Vascular: No JVD.  Cardiovascular:     Rate and Rhythm: Normal rate and regular rhythm.     Heart sounds: Normal heart sounds. No murmur heard.    No friction rub. No gallop.  Pulmonary:     Effort: Pulmonary effort is normal. No respiratory distress.     Breath sounds: Normal breath sounds. No wheezing or rales.  Chest:     Chest wall: No tenderness.  Abdominal:     General: Bowel sounds are normal. There is no distension.     Palpations: Abdomen is soft. There is no mass.     Tenderness: There is no abdominal tenderness. There is no guarding or rebound.   Musculoskeletal:        General: No tenderness. Normal range of motion.     Cervical back: Normal range of motion.  Lymphadenopathy:     Cervical: No cervical adenopathy.  Skin:    General: Skin is warm and dry.     Findings: No rash.  Neurological:     Mental Status: He is alert and oriented to person, place, and time.     Cranial Nerves: No cranial nerve deficit.     Motor: No abnormal muscle tone.     Coordination: Coordination normal.     Gait: Gait normal.     Deep Tendon Reflexes: Reflexes are normal and symmetric.  Psychiatric:        Behavior: Behavior normal.        Thought Content: Thought content normal.        Judgment: Judgment normal.   Cholecystostomy drain in the RUQ O2 is on  Lab Results  Component Value Date   WBC 8.6 08/24/2022   HGB 13.4 08/24/2022   HCT 41.2 08/24/2022   PLT 280 08/24/2022   GLUCOSE 103 (H) 08/24/2022   CHOL 153 11/23/2018   TRIG 201.0 (H) 11/23/2018   HDL 38.20 (L) 11/23/2018   LDLDIRECT 99.0 11/23/2018   LDLCALC 80 11/07/2017   ALT 15 08/24/2022   AST 19 08/24/2022   NA 139 08/24/2022   K 4.0 08/24/2022   CL 103 08/24/2022   CREATININE 0.84 08/24/2022   BUN 7 (L) 08/24/2022   CO2 31 08/24/2022   TSH 2.689 08/24/2022   PSA 0.06 (L) 07/22/2021   INR 1.0 05/04/2022    IR EXCHANGE BILIARY DRAIN  Result Date: 11/30/2022 INDICATION: 77 year old male with history of calculus cholecystitis status post percutaneous cholecystostomy tube placement on 01/21/2022 and attempted percutaneous gallstone retrieval on 05/04/2022 which was unsuccessful. The patient has elected for chronic indwelling cholecystostomy tube and presents for routine exchange. EXAM: FLUOROSCOPIC GUIDED CHOLECYSTOSTOMY TUBE EXCHANGE COMPARISON:  09/07/2022 MEDICATIONS: None ANESTHESIA/SEDATION: None CONTRAST:  15mL OMNIPAQUE IOHEXOL 300 MG/ML SOLN - administered into the gallbladder lumen. FLUOROSCOPY TIME:  Thirty-one mGy COMPLICATIONS: None immediate. PROCEDURE: The  patient was positioned supine on the fluoroscopy table. The external portion of the existing cholecystostomy tube as well as the surrounding skin was prepped and draped in usual sterile fashion. A time-out was performed prior to the initiation of the procedure. A preprocedural spot fluoroscopic image was obtained of the right upper abdominal quadrant existing cholecystostomy tube. The skin surrounding the cholecystostomy tube was anesthetized with 1% lidocaine with epinephrine. The external portion of the cholecystostomy tube was cut and cannulated with a short Amplatz wire which was advanced through the tube and coiled within the gallbladder  lumen Next, under intermittent fluoroscopic guidance, the existing 8 French cholecystostomy tube was exchanged for a new 8 [place] cholecystostomy tube which was repositioned into the more central aspect of the gallbladder lumen. Contrast injection confirms appropriate positioning and functionality of the cholecystomy tube. The cholecystostomy tube was flushed with a small amount of saline and reconnected to a gravity bag. The cholecystostomy tube was secured with an interrupted suture and a Stat Lock device. A dressing was applied. The patient tolerated the procedure well without immediate postprocedural complication. FINDINGS: Preprocedural spot fluoroscopic image demonstrates unchanged positioning of cholecystostomy tube with end coiled and locked over the expected location of the fundus of the gallbladder After fluoroscopic guided exchange, the new 8 [place] cholecystostomy tube is more ideally positioned with end coiled and locked within the central aspect of the gallbladder lumen. Post exchange cholangiogram demonstrates appropriate positioning and functionality of the new cholecystostomy tube. IMPRESSION: Successful fluoroscopic guided exchange of 8 French cholecystostomy tube. PLAN: Follow-up in 2-3 months for routine cholecystostomy tube exchange. Marliss Coots, MD  [facility] Electronically Signed   By: Marliss Coots M.D.   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     Follow-up: Return in about 4 months (around 05/08/2023) for a follow-up visit.  Sonda Primes, MD

## 2023-01-25 ENCOUNTER — Telehealth: Payer: Self-pay | Admitting: Pulmonary Disease

## 2023-01-25 NOTE — Telephone Encounter (Signed)
NFN. Thank you!

## 2023-01-25 NOTE — Telephone Encounter (Signed)
PT calling to confirm appt w/Dr. Francine Graven for tomorrow. He has no appt and no cancellations. I am not sure what happened but he is a cancer pt and Dr. Thora Lance has turned his care over to Dr. Francine Graven for care. I would need a 30 min NEW PT appt. Nothing is avail. Until Dr. Lanora Manis new sched is released. Tomorrow he has a 15 min opening in the AM or if a virtual appt is OK Friday please let us know. Direct message me so I can call PT to advise please. Thanks.

## 2023-02-03 ENCOUNTER — Other Ambulatory Visit: Payer: Self-pay | Admitting: Physician Assistant

## 2023-02-03 DIAGNOSIS — E039 Hypothyroidism, unspecified: Secondary | ICD-10-CM

## 2023-02-06 ENCOUNTER — Other Ambulatory Visit: Payer: Self-pay | Admitting: Internal Medicine

## 2023-02-07 ENCOUNTER — Other Ambulatory Visit (HOSPITAL_COMMUNITY): Payer: Self-pay

## 2023-02-07 MED ORDER — NORMAL SALINE FLUSH 0.9 % IV SOLN
10.0000 mL | Freq: Every day | INTRAVENOUS | 3 refills | Status: DC
  Filled 2023-02-07: qty 560, 56d supply, fill #0
  Filled 2023-04-04: qty 560, 56d supply, fill #1
  Filled 2023-05-30: qty 560, 56d supply, fill #2
  Filled 2023-07-25: qty 560, 56d supply, fill #3

## 2023-02-22 ENCOUNTER — Ambulatory Visit (HOSPITAL_COMMUNITY)
Admission: RE | Admit: 2023-02-22 | Discharge: 2023-02-22 | Disposition: A | Discharge status: Home / Self Care | Payer: Medicare Other | Source: Ambulatory Visit | Attending: Interventional Radiology | Admitting: Interventional Radiology

## 2023-02-22 DIAGNOSIS — C3490 Malignant neoplasm of unspecified part of unspecified bronchus or lung: Secondary | ICD-10-CM | POA: Insufficient documentation

## 2023-02-22 DIAGNOSIS — Z434 Encounter for attention to other artificial openings of digestive tract: Secondary | ICD-10-CM | POA: Diagnosis not present

## 2023-02-22 DIAGNOSIS — K8000 Calculus of gallbladder with acute cholecystitis without obstruction: Secondary | ICD-10-CM | POA: Diagnosis not present

## 2023-02-22 HISTORY — PX: IR EXCHANGE BILIARY DRAIN: IMG6046

## 2023-02-22 MED ORDER — IOHEXOL 300 MG/ML  SOLN
50.0000 mL | Freq: Once | INTRAMUSCULAR | Status: AC | PRN
  Administered 2023-02-22: 15 mL

## 2023-02-22 MED ORDER — LIDOCAINE HCL 1 % IJ SOLN
20.0000 mL | Freq: Once | INTRAMUSCULAR | Status: AC
  Administered 2023-02-22: 5 mL via INTRADERMAL

## 2023-02-22 MED ORDER — LIDOCAINE HCL 1 % IJ SOLN
INTRAMUSCULAR | Status: AC
  Filled 2023-02-22: qty 20

## 2023-02-23 ENCOUNTER — Inpatient Hospital Stay: Payer: Medicare Other | Attending: Internal Medicine

## 2023-02-23 ENCOUNTER — Other Ambulatory Visit (HOSPITAL_COMMUNITY): Payer: Self-pay | Admitting: Interventional Radiology

## 2023-02-23 ENCOUNTER — Ambulatory Visit (HOSPITAL_COMMUNITY)
Admission: RE | Admit: 2023-02-23 | Discharge: 2023-02-23 | Disposition: A | Discharge status: Home / Self Care | Payer: Medicare Other | Source: Ambulatory Visit | Attending: Internal Medicine | Admitting: Internal Medicine

## 2023-02-23 DIAGNOSIS — I700 Atherosclerosis of aorta: Secondary | ICD-10-CM | POA: Diagnosis not present

## 2023-02-23 DIAGNOSIS — R0600 Dyspnea, unspecified: Secondary | ICD-10-CM | POA: Diagnosis not present

## 2023-02-23 DIAGNOSIS — C9110 Chronic lymphocytic leukemia of B-cell type not having achieved remission: Secondary | ICD-10-CM | POA: Diagnosis not present

## 2023-02-23 DIAGNOSIS — Z9221 Personal history of antineoplastic chemotherapy: Secondary | ICD-10-CM | POA: Diagnosis not present

## 2023-02-23 DIAGNOSIS — Z923 Personal history of irradiation: Secondary | ICD-10-CM | POA: Insufficient documentation

## 2023-02-23 DIAGNOSIS — C3490 Malignant neoplasm of unspecified part of unspecified bronchus or lung: Secondary | ICD-10-CM | POA: Diagnosis not present

## 2023-02-23 DIAGNOSIS — J439 Emphysema, unspecified: Secondary | ICD-10-CM | POA: Diagnosis not present

## 2023-02-23 DIAGNOSIS — K8010 Calculus of gallbladder with chronic cholecystitis without obstruction: Secondary | ICD-10-CM | POA: Diagnosis not present

## 2023-02-23 DIAGNOSIS — Z79620 Long term (current) use of immunosuppressive biologic: Secondary | ICD-10-CM | POA: Insufficient documentation

## 2023-02-23 DIAGNOSIS — C3412 Malignant neoplasm of upper lobe, left bronchus or lung: Secondary | ICD-10-CM | POA: Diagnosis not present

## 2023-02-23 DIAGNOSIS — K810 Acute cholecystitis: Secondary | ICD-10-CM

## 2023-02-23 DIAGNOSIS — E039 Hypothyroidism, unspecified: Secondary | ICD-10-CM

## 2023-02-23 DIAGNOSIS — K8000 Calculus of gallbladder with acute cholecystitis without obstruction: Secondary | ICD-10-CM | POA: Diagnosis not present

## 2023-02-23 LAB — TSH: TSH: 1.615 u[IU]/mL (ref 0.350–4.500)

## 2023-02-23 LAB — CMP (CANCER CENTER ONLY)
ALT: 20 U/L (ref 0–44)
AST: 22 U/L (ref 15–41)
Albumin: 4.1 g/dL (ref 3.5–5.0)
Alkaline Phosphatase: 81 U/L (ref 38–126)
Anion gap: 5 (ref 5–15)
BUN: 11 mg/dL (ref 8–23)
CO2: 33 mmol/L — ABNORMAL HIGH (ref 22–32)
Calcium: 10.2 mg/dL (ref 8.9–10.3)
Chloride: 103 mmol/L (ref 98–111)
Creatinine: 0.82 mg/dL (ref 0.61–1.24)
GFR, Estimated: >60 mL/min (ref >60)
Glucose, Bld: 108 mg/dL — ABNORMAL HIGH (ref 70–99)
Potassium: 3.9 mmol/L (ref 3.5–5.1)
Sodium: 141 mmol/L (ref 135–145)
Total Bilirubin: 1.9 mg/dL — ABNORMAL HIGH (ref <1.2)
Total Protein: 7.6 g/dL (ref 6.5–8.1)

## 2023-02-23 LAB — CBC WITH DIFFERENTIAL (CANCER CENTER ONLY)
Abs Immature Granulocytes: 0.04 10*3/uL (ref 0.00–0.07)
Basophils Absolute: 0.1 10*3/uL (ref 0.0–0.1)
Basophils Relative: 1 %
Eosinophils Absolute: 0.1 10*3/uL (ref 0.0–0.5)
Eosinophils Relative: 1 %
HCT: 46.2 % (ref 39.0–52.0)
Hemoglobin: 15 g/dL (ref 13.0–17.0)
Immature Granulocytes: 0 %
Lymphocytes Relative: 16 %
Lymphs Abs: 2 10*3/uL (ref 0.7–4.0)
MCH: 29.5 pg (ref 26.0–34.0)
MCHC: 32.5 g/dL (ref 30.0–36.0)
MCV: 90.8 fL (ref 80.0–100.0)
Monocytes Absolute: 1.1 10*3/uL — ABNORMAL HIGH (ref 0.1–1.0)
Monocytes Relative: 8 %
Neutro Abs: 9.5 10*3/uL — ABNORMAL HIGH (ref 1.7–7.7)
Neutrophils Relative %: 74 %
Platelet Count: 318 10*3/uL (ref 150–400)
RBC: 5.09 MIL/uL (ref 4.22–5.81)
RDW: 12.8 % (ref 11.5–15.5)
WBC Count: 12.8 10*3/uL — ABNORMAL HIGH (ref 4.0–10.5)
nRBC: 0 % (ref 0.0–0.2)

## 2023-02-23 MED ORDER — IOHEXOL 300 MG/ML  SOLN
75.0000 mL | Freq: Once | INTRAMUSCULAR | Status: AC | PRN
  Administered 2023-02-23: 75 mL via INTRAVENOUS

## 2023-02-28 ENCOUNTER — Inpatient Hospital Stay (HOSPITAL_BASED_OUTPATIENT_CLINIC_OR_DEPARTMENT_OTHER): Payer: Medicare Other | Admitting: Internal Medicine

## 2023-02-28 VITALS — BP 123/62 | HR 72 | Temp 98.0°F | Resp 15 | Ht 71.0 in | Wt 189.9 lb

## 2023-02-28 DIAGNOSIS — Z79620 Long term (current) use of immunosuppressive biologic: Secondary | ICD-10-CM | POA: Diagnosis not present

## 2023-02-28 DIAGNOSIS — Z923 Personal history of irradiation: Secondary | ICD-10-CM | POA: Diagnosis not present

## 2023-02-28 DIAGNOSIS — C9110 Chronic lymphocytic leukemia of B-cell type not having achieved remission: Secondary | ICD-10-CM | POA: Diagnosis not present

## 2023-02-28 DIAGNOSIS — C3490 Malignant neoplasm of unspecified part of unspecified bronchus or lung: Secondary | ICD-10-CM | POA: Diagnosis not present

## 2023-02-28 DIAGNOSIS — C3412 Malignant neoplasm of upper lobe, left bronchus or lung: Secondary | ICD-10-CM | POA: Diagnosis not present

## 2023-02-28 DIAGNOSIS — R0600 Dyspnea, unspecified: Secondary | ICD-10-CM | POA: Diagnosis not present

## 2023-02-28 DIAGNOSIS — K8010 Calculus of gallbladder with chronic cholecystitis without obstruction: Secondary | ICD-10-CM | POA: Diagnosis not present

## 2023-02-28 NOTE — Progress Notes (Signed)
[facility] [phone]   [phone]  OFFICE PROGRESS NOTE  [name], Joe Quint, MD [address] [place] [address]  DIAGNOSIS: 1) Stage IIIb non-small cell lung cancer, squamous cell carcinoma.  The patient presented with a left upper lobe lung mass as well as associated mediastinal lymphadenopathy and contralateral right hilar mild hypermetabolism.  There was heterogeneous marrow uptake in the spine but with more focal areas at L4 without imaging correlation on CT scan.  The patient was diagnosed in August 2021. 2) CLL stage 0, diagnosed in 2015/03/14   PD-L1 expression: 2%   PRIOR THERAPY:  1) Concurrent chemoradiation with carboplatin for an AUC of 2 and paclitaxel 45 mg per metered squared weekly.  First dose expected on 12/13/2019. Status post 5 cycles.   Last dose was giving January 14, 2020. 2) Consolidation treatment with immunotherapy with Imfinzi 1500 mg IV every 4 weeks.  First dose February 19, 2020.  Status post 13 cycles.   CURRENT THERAPY: Observation.  INTERVAL HISTORY: Joe [name] 77 y.o. male returns to the clinic today for follow-up visit.Discussed the use of AI scribe software for clinical note transcription with the patient, who gave verbal consent to proceed.  History of Present Illness   Joe [name], a 77 year old patient with a history of stage 3B non-small cell lung cancer (squamous cell carcinoma) diagnosed in August 2021, completed a course of chemotherapy, radiation, and one year of immunotherapy (Durvalumab) by November 2022. The patient also has a history of chronic lymphocytic leukemia (CLL) since 03-14-15.  In the past six months, the patient has been experiencing digestive issues, including gas, abdominal cramps, and bowel irregularities, associated with a gallbladder drain placed due to cholecystitis. The patient was deemed too high risk for surgical removal of the gallbladder.  The patient also reports a  decline in respiratory function, noting increased breathlessness during activities such as climbing stairs, even while on 2 liters of oxygen. The patient has not been using any inhalers.  The patient's recent scans show a nodule in the right lung, which is being monitored.       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      GERD 12/02/2006   Chronic      Grade I diastolic dysfunction 01/20/2022   Grief 2016-05-22   [name] died in 03-14-15   Gynecomastia 2013-12-21  Benign B 2015    Hyperlipidemia    Hypertension    Hypothyroidism 12/25/2014   2016 On Levothyroxine    Intertrigo 02/02/2012   11/13    Neoplasm of uncertain behavior of skin 03/12/2013   12/14 R ear, chest    OSA on CPAP    mild with AHI 9/hr and oxygen desats as low as 75%   Paresis (HCC)    right- s/p cerv decompression   PERIORBITAL CELLULITIS 02/22/2009   Qualifier: Diagnosis of  By: Ermalene Searing MD, Amy     Retinal detachment    L>>R    ALLERGIES:  has No Known Allergies.  MEDICATIONS:  Current Outpatient  Medications  Medication Sig Dispense Refill   albuterol (PROVENTIL) (2.5 MG/3ML) 0.083% nebulizer solution Take 3 mLs (2.5 mg total) by nebulization every 6 (six) hours as needed for wheezing or shortness of breath. (Patient not taking: Reported on 12/16/2022) 75 mL 12   amLODipine (NORVASC) 5 MG tablet TAKE 1 TABLET BY MOUTH EVERY DAY 90 tablet 1   apixaban (ELIQUIS) 5 MG TABS tablet Take 1 tablet (5 mg total) by mouth 2 (two) times daily. 60 tablet 11   atenolol (TENORMIN) 25 MG tablet Take 1 tablet (25 mg total) by mouth daily as needed (palpitations). 90 tablet 0   Cholecalciferol 25 MCG (1000 UT) tablet Take 1,000 Units by mouth daily.     diazepam (VALIUM) 5 MG tablet Take 1 tablet (5 mg total) by mouth every 12 (twelve) hours as needed for anxiety. 180 tablet 1   levothyroxine (SYNTHROID) 137 MCG tablet TAKE 1 TABLET(137 MCG) BY MOUTH DAILY BEFORE BREAKFAST 30 tablet 2   lovastatin (MEVACOR) 20 MG tablet TAKE 1 TABLET BY MOUTH EVERY NIGHT AT BEDTIME 90 tablet 2   Multiple Vitamin (MULTIVITAMIN) tablet Take 1 tablet by mouth daily. Centrum Silver.     Polyethyl Glycol-Propyl Glycol (SYSTANE ULTRA OP) Place 1 drop into both eyes 2 (two) times daily as needed (dry eyes).     Sodium Chloride Flush (NORMAL SALINE FLUSH) 0.9 % SOLN Flush gallbladder drain with 5 - 10 ml once daily 560 mL 3   No current facility-administered medications for this visit.    SURGICAL HISTORY:  Past Surgical History:  Procedure Laterality Date   BICEPS TENDON REPAIR     BRONCHIAL BIOPSY  10/23/2019   Procedure: BRONCHIAL BIOPSIES;  Surgeon: Leslye Peer, MD;  Location: [facility] ENDOSCOPY;  Service: Pulmonary;;   BRONCHIAL BIOPSY  11/20/2019   Procedure: BRONCHIAL BIOPSIES;  Surgeon: Leslye Peer, MD;  Location: [facility] ENDOSCOPY;  Service: Pulmonary;;   BRONCHIAL BRUSHINGS  10/23/2019   Procedure: BRONCHIAL BRUSHINGS;  Surgeon: Leslye Peer, MD;  Location: [facility] ENDOSCOPY;  Service: Pulmonary;;   BRONCHIAL BRUSHINGS   11/20/2019   Procedure: BRONCHIAL BRUSHINGS;  Surgeon: Leslye Peer, MD;  Location: [facility] ENDOSCOPY;  Service: Pulmonary;;   BRONCHIAL NEEDLE ASPIRATION BIOPSY  10/23/2019   Procedure: BRONCHIAL NEEDLE ASPIRATION BIOPSIES;  Surgeon: Leslye Peer, MD;  Location: [facility] ENDOSCOPY;  Service: Pulmonary;;   BRONCHIAL NEEDLE ASPIRATION BIOPSY  11/20/2019   Procedure: BRONCHIAL NEEDLE ASPIRATION BIOPSIES;  Surgeon: Leslye Peer, MD;  Location: [facility] ENDOSCOPY;  Service: Pulmonary;;   BRONCHIAL WASHINGS  11/20/2019   Procedure: BRONCHIAL WASHINGS;  Surgeon: Leslye Peer, MD;  Location: [facility] ENDOSCOPY;  Service: Pulmonary;;   CATARACT EXTRACTION Left    COLONOSCOPY  04-14-99   Dr Herma Ard polyp-TA in epic   IR BALLOON DILATION OF BILIARY DUCTS/AMPULLA  05/04/2022  IR CONVERT BILIARY DRAIN TO INT EXT BILIARY DRAIN  05/04/2022   IR CONVERT BILIARY DRAIN TO INT EXT BILIARY DRAIN  05/27/2022   IR EXCHANGE BILIARY DRAIN  02/02/2022   IR EXCHANGE BILIARY DRAIN  03/19/2022   IR EXCHANGE BILIARY DRAIN  07/13/2022   IR EXCHANGE BILIARY DRAIN  09/07/2022   IR EXCHANGE BILIARY DRAIN  11/30/2022   IR EXCHANGE BILIARY DRAIN  02/22/2023   IR PERC CHOLECYSTOSTOMY  01/21/2022   IR RADIOLOGIST EVAL & MGMT  04/08/2022   IR RADIOLOGIST EVAL & MGMT  05/17/2022   IR REMOVAL OF CALCULI/DEBRIS BILIARY DUCT/GB  05/04/2022   POLYPECTOMY  04-14-99   POSTERIOR LAMINECTOMY / DECOMPRESSION CERVICAL SPINE     Dr Wynetta Emery   RETINAL DETACHMENT SURGERY     left eye, 2007 x2, 2008 x 3   ROTATOR CUFF REPAIR  2004   right   TONSILLECTOMY  [id number]   VIDEO BRONCHOSCOPY WITH ENDOBRONCHIAL NAVIGATION N/A 10/23/2019   Procedure: VIDEO BRONCHOSCOPY WITH ENDOBRONCHIAL NAVIGATION;  Surgeon: Leslye Peer, MD;  Location: [facility] ENDOSCOPY;  Service: Pulmonary;  Laterality: N/A;   VIDEO BRONCHOSCOPY WITH ENDOBRONCHIAL NAVIGATION N/A 11/20/2019   Procedure: VIDEO BRONCHOSCOPY WITH ENDOBRONCHIAL NAVIGATION;  Surgeon: Leslye Peer, MD;  Location: [facility] ENDOSCOPY;   Service: Pulmonary;  Laterality: N/A;   VIDEO BRONCHOSCOPY WITH ENDOBRONCHIAL ULTRASOUND N/A 10/23/2019   Procedure: VIDEO BRONCHOSCOPY WITH ENDOBRONCHIAL ULTRASOUND;  Surgeon: Leslye Peer, MD;  Location: [facility] ENDOSCOPY;  Service: Pulmonary;  Laterality: N/A;    REVIEW OF SYSTEMS:  Constitutional: positive for fatigue Eyes: negative Ears, nose, mouth, throat, and face: negative Respiratory: positive for dyspnea on exertion Cardiovascular: negative Gastrointestinal: positive for abdominal pain and nausea Genitourinary:negative Integument/breast: negative Hematologic/lymphatic: negative Musculoskeletal:negative Neurological: negative Behavioral/Psych: negative Endocrine: negative Allergic/Immunologic: negative   PHYSICAL EXAMINATION: General appearance: alert, cooperative, and no distress Head: Normocephalic, without obvious abnormality, atraumatic Neck: no adenopathy, no JVD, supple, symmetrical, trachea midline, and thyroid not enlarged, symmetric, no tenderness/mass/nodules Lymph nodes: Cervical, supraclavicular, and axillary nodes normal. Resp: clear to auscultation bilaterally Back: symmetric, no curvature. ROM normal. No CVA tenderness. Cardio: regular rate and rhythm, S1, S2 normal, no murmur, click, rub or gallop GI: soft, non-tender; bowel sounds normal; no masses,  no organomegaly Extremities: extremities normal, atraumatic, no cyanosis or edema Neurologic: Alert and oriented X 3, normal strength and tone. Normal symmetric reflexes. Normal coordination and gait  ECOG PERFORMANCE STATUS: 1 - Symptomatic but completely ambulatory  Blood pressure 123/62, pulse 72, temperature 98 F (36.7 C), temperature source Temporal, resp. rate 15, height 5\' 11"  (1.803 m), weight 189 lb 14.4 oz (86.1 kg), SpO2 99%.  LABORATORY DATA: Lab Results  Component Value Date   WBC 12.8 (H) 02/23/2023   HGB 15.0 02/23/2023   HCT 46.2 02/23/2023   MCV 90.8 02/23/2023   PLT 318 02/23/2023       Chemistry      Component Value Date/Time   NA 141 02/23/2023 0952   NA 143 03/10/2017 0911   K 3.9 02/23/2023 0952   K 4.7 03/10/2017 0911   CL 103 02/23/2023 0952   CO2 33 (H) 02/23/2023 0952   CO2 30 (H) 03/10/2017 0911   BUN 11 02/23/2023 0952   BUN 10.7 03/10/2017 0911   CREATININE 0.82 02/23/2023 0952   CREATININE 0.8 03/10/2017 0911      Component Value Date/Time   CALCIUM 10.2 02/23/2023 0952   CALCIUM 9.4 03/10/2017 0911   ALKPHOS 81 02/23/2023 0952   ALKPHOS 76  03/10/2017 0911   AST 22 02/23/2023 0952   AST 34 03/10/2017 0911   ALT 20 02/23/2023 0952   ALT 45 03/10/2017 0911   BILITOT 1.9 (H) 02/23/2023 0952   BILITOT 1.41 (H) 03/10/2017 0911       RADIOGRAPHIC STUDIES: CT Chest W Contrast  Result Date: 02/25/2023 CLINICAL DATA:  Follow-up non-small cell lung cancer EXAM: CT CHEST WITH CONTRAST TECHNIQUE: Multidetector CT imaging of the chest was performed during intravenous contrast administration. RADIATION DOSE REDUCTION: This exam was performed according to the departmental dose-optimization program which includes automated exposure control, adjustment of the mA and/or kV according to patient size and/or use of iterative reconstruction technique. CONTRAST:  75mL OMNIPAQUE IOHEXOL 300 MG/ML  SOLN COMPARISON:  CT chest dated 08/24/2022 FINDINGS: Cardiovascular: The heart is normal in size. Leftward cardiomediastinal shift. No evidence of thoracic aneurysm. Atherosclerotic calcifications of the aortic arch. Moderate atherosclerosis of the LAD and left circumflex. Mediastinum/Nodes: No suspicious mediastinal lymphadenopathy. Lungs/Pleura: Radiation changes with volume loss in the left upper lobe. 15 x 9 mm subpleural nodular opacity in the lateral right upper lobe, previously 12 x 8 mm. Additional 5 mm triangular subpleural nodule in the right upper lobe (series 6/image 31), unchanged, likely reflecting a benign subpleural lymph node. Moderate centrilobular and  paraseptal emphysematous changes, upper lung predominant. Mild subpleural reticulation/fibrosis, lower lobe predominant. No focal consolidation. No pleural effusion or pneumothorax. Upper Abdomen: Visualized upper abdomen is notable for cholelithiasis with a decompressed gallbladder and indwelling percutaneous cholecystostomy. Musculoskeletal: Degenerative changes of the visualized thoracolumbar spine. Anterior wedging with superior endplate Schmorl's node deformity at L1. IMPRESSION: Radiation changes with volume loss in the left upper lobe. 15 x 9 mm subpleural nodular opacity in the lateral right upper lobe, mildly progressive. Attention on follow-up is suggested to exclude metachronous primary bronchogenic carcinoma versus metastasis. Additional stable ancillary findings as above. Aortic Atherosclerosis (ICD10-I70.0) and Emphysema (ICD10-J43.9). Electronically Signed   By: Charline Bills M.D.   On: 02/25/2023 22:43   IR EXCHANGE BILIARY DRAIN  Result Date: 02/22/2023 INDICATION: 77 year old male with history of calculus cholecystitis status post percutaneous cholecystostomy tube placement on 01/21/2022 and attempted percutaneous gallstone retrieval on 05/04/2022 which was unsuccessful. The patient has elected for chronic indwelling cholecystostomy tube and presents for routine exchange. EXAM: FLUOROSCOPIC GUIDED CHOLECYSTOSTOMY TUBE EXCHANGE COMPARISON:  11/22/2022 MEDICATIONS: None ANESTHESIA/SEDATION: None CONTRAST:  15mL OMNIPAQUE IOHEXOL 300 MG/ML SOLN - administered into the gallbladder lumen. FLUOROSCOPY TIME:  Twenty-four mGy COMPLICATIONS: None immediate. PROCEDURE: The patient was positioned supine on the fluoroscopy table. The external portion of the existing cholecystostomy tube as well as the surrounding skin was prepped and draped in usual sterile fashion. A time-out was performed prior to the initiation of the procedure. A preprocedural spot fluoroscopic image was obtained of the right upper  abdominal quadrant existing cholecystostomy tube. The skin surrounding the cholecystostomy tube was anesthetized with 1% lidocaine with epinephrine. The external portion of the cholecystostomy tube was cut and cannulated with a short Amplatz wire which was advanced through the tube and coiled within the gallbladder lumen Next, under intermittent fluoroscopic guidance, the existing 8 [place] cholecystostomy tube was exchanged for a new, 8 [place] cholecystostomy tube which was repositioned into the more central aspect of the gallbladder lumen. Contrast injection confirms appropriate positioning and functionality of the cholecystomy tube. The cholecystostomy tube was flushed with a small amount of saline and reconnected to a gravity bag. The cholecystostomy tube was secured with an interrupted suture and a [employer]  device. A dressing was applied. The patient tolerated the procedure well without immediate postprocedural complication. FINDINGS: Preprocedural spot fluoroscopic image demonstrates unchanged positioning of cholecystostomy tube with end coiled and locked over the expected location of the fundus of the gallbladder After fluoroscopic guided exchange, the new 8 [place] cholecystostomy tube is more ideally positioned with end coiled and locked within the central aspect of the gallbladder lumen. Post exchange cholangiogram demonstrates appropriate positioning and functionality of the new cholecystostomy tube. IMPRESSION: Successful fluoroscopic guided exchange of 8 French cholecystostomy tube. PLAN: Follow-up in 2-3 months for routine cholecystostomy tube exchange. Marliss Coots, MD [facility] Electronically Signed   By: Marliss Coots M.D.   On: 02/22/2023 16:39     ASSESSMENT AND PLAN: This is a very pleasant 77 years old white male with a stage IIIb non-small cell lung cancer, squamous cell carcinoma diagnosed in August of 2021.  The patient also has a  history of stage 0 CLL.  He underwent a course of concurrent chemoradiation with weekly carboplatin for AUC of 2 and paclitaxel 45 mg/M2 status post 5 cycles of the treatment.  He tolerated this treatment well with no concerning adverse effect except for mild odynophagia and dysphagia as well as thrombocytopenia. His scan showed improvement of his disease with decrease in the size of the left upper lobe lung mass as well as decrease in the mediastinal lymphadenopathy and no evidence of metastatic disease. The patient completed consolidation treatment with immunotherapy with Imfinzi 1500 mg IV every 4 weeks status post 13 cycles. Previous CT scan of the chest showed the left upper lobe postradiation changes with associated areas of increased soft tissue concerning for evolving postradiation changes or recurrent disease and PET scan was recommended for further evaluation.  He also had slightly increased size of subcarinal lymph node.  The patient had a PET scan performed on January 15, 2022 and that showed a stable appearance of the masslike architecture and fibrosis with volume loss involving the left upper lobe and left upper hilar region with low-level tracer uptake with no signs of tracer avid tumor recurrence or metastatic disease.  The patient has been on observation and he is feeling fine. He had repeat CT scan of the chest performed recently.  I personally and independently reviewed the scan and discussed the result with the patient today. His scan showed radiation changes with volume loss in the left upper lobe but there was a 1.5 x 0.9 cm subpleural nodular opacity in the lateral right upper lobe mildly progressive and suspicious for metachronous or metastatic lesion.    Non-Small Cell Lung Cancer (NSCLC), Stage III B Diagnosed with stage III B NSCLC, squamous cell carcinoma in August 2021. Completed chemotherapy, radiation, and one year of durvalumab immunotherapy by November 2022. Recent scan  shows a nodule in the right lung near the pleural area. Differential diagnosis includes inflammation versus recurrence. PET scan needed to determine the nature of the nodule. If malignant, radiation therapy will be considered, which would be shorter in duration compared to previous treatments. - Order PET scan within the next week or two - Follow-up appointment after PET scan to discuss results and potential need for radiation therapy  Dyspnea Reports increased dyspnea with activities such as carrying groceries. Currently on 2 L/min of oxygen. Oxygen saturation drops to 91% with exertion. No use of inhalers reported.  Chronic Lymphocytic Leukemia (CLL) CLL since 2016. No new symptoms or issues discussed during this visit.  Cholecystitis  with Gallbladder Drainage Diagnosed with cholecystitis and deemed too high risk for surgery. Managed with periodic gallbladder drainage replacement. Symptoms include gas, abdominal cramps, and bowel issues.  Follow-up - Schedule follow-up appointment after the new year to review PET scan results - Provide a copy of blood work results to the patient.   The patient was advised to call immediately if he has any other concerning symptoms in the interval. The patient voices understanding of current disease status and treatment options and is in agreement with the current care plan. The total time spent in the appointment was 30 minutes.  All questions were answered. The patient knows to call the clinic with any problems, questions or concerns. We can certainly see the patient much sooner if necessary.  Disclaimer: This note was dictated with voice recognition software. Similar sounding words can inadvertently be transcribed and may not be corrected upon review.

## 2023-03-07 ENCOUNTER — Encounter (HOSPITAL_COMMUNITY)
Admission: RE | Admit: 2023-03-07 | Discharge: 2023-03-07 | Disposition: A | Discharge status: Home / Self Care | Payer: Medicare Other | Source: Ambulatory Visit | Attending: Internal Medicine | Admitting: Internal Medicine

## 2023-03-07 DIAGNOSIS — Z9049 Acquired absence of other specified parts of digestive tract: Secondary | ICD-10-CM | POA: Insufficient documentation

## 2023-03-07 DIAGNOSIS — R918 Other nonspecific abnormal finding of lung field: Secondary | ICD-10-CM | POA: Insufficient documentation

## 2023-03-07 DIAGNOSIS — C3412 Malignant neoplasm of upper lobe, left bronchus or lung: Secondary | ICD-10-CM | POA: Insufficient documentation

## 2023-03-07 DIAGNOSIS — Z923 Personal history of irradiation: Secondary | ICD-10-CM | POA: Diagnosis not present

## 2023-03-07 DIAGNOSIS — C3490 Malignant neoplasm of unspecified part of unspecified bronchus or lung: Secondary | ICD-10-CM | POA: Insufficient documentation

## 2023-03-07 DIAGNOSIS — C3411 Malignant neoplasm of upper lobe, right bronchus or lung: Secondary | ICD-10-CM | POA: Diagnosis not present

## 2023-03-07 LAB — GLUCOSE, CAPILLARY: Glucose-Capillary: 78 mg/dL (ref 70–99)

## 2023-03-07 MED ORDER — FLUDEOXYGLUCOSE F - 18 (FDG) INJECTION
9.8000 | Freq: Once | INTRAVENOUS | Status: AC
  Administered 2023-03-07: 9.81 via INTRAVENOUS

## 2023-03-28 ENCOUNTER — Telehealth: Payer: Self-pay | Admitting: Internal Medicine

## 2023-03-28 ENCOUNTER — Inpatient Hospital Stay: Payer: Medicare Other

## 2023-03-28 ENCOUNTER — Inpatient Hospital Stay: Payer: Medicare Other | Admitting: Internal Medicine

## 2023-04-04 ENCOUNTER — Other Ambulatory Visit (HOSPITAL_COMMUNITY): Payer: Self-pay

## 2023-04-05 NOTE — Progress Notes (Signed)
[facility] OFFICE PROGRESS NOTE  [name], [name], MD [address] [place] [address]  DIAGNOSIS:  1) Initially diagnosed as stage IIIb non-small cell lung cancer, squamous cell carcinoma.  The patient presented with a left upper lobe lung mass as well as associated mediastinal lymphadenopathy and contralateral right hilar mild hypermetabolism.  There was heterogeneous marrow uptake in the spine but with more focal areas at L4 without imaging correlation on CT scan.  The patient was diagnosed in August 2021. He had some local recurrence and new subpleural right upper lobe nodule in December 2024.  2) CLL stage 0, diagnosed in 2016  PD-L1 expression: 2%   PRIOR THERAPY: 1) Concurrent chemoradiation with carboplatin for an AUC of 2 and paclitaxel 45 mg per metered squared weekly.  First dose expected on 12/13/2019. Status post 5 cycles.   Last dose was giving January 14, 2020. 2) Consolidation treatment with immunotherapy with Imfinzi 1500 mg IV every 4 weeks.  First dose February 19, 2020.  Status post 13 cycles  CURRENT THERAPY: Referral to radiation oncology for the new subpleural nodule and residual tumor in left upper lobe.   INTERVAL HISTORY: Joe [name] 78 y.o. male returns to the clinic today for a follow-up visit.  The patient was last seen by Dr. Arbutus Ped on 02/28/2023.  The patient has a history of stage IIIb non-small cell lung cancer, squamous cell carcinoma which was diagnosed in August 2021.  The patient completed concurrent chemoradiation and consolidation immunotherapy with Imfinzi. He then has been on observation since completing immunotherapy in October 2022. Of note the patient also has chronic lymphocytic leukemia since 2016 for which he is on observation.  When he was last seen by Dr. Arbutus Ped in December 2024, the patient was endorsing digestive issues since having cholecystitis.  He has a biliary drain as he is not a candidate for a  cholecystectomy.  He was also endorsing increased dyspnea on exertion such as climbing stairs and bending forward.  He is on 2 L supplemental oxygen. He has increased his oxygen to three liters during activities such as bringing in groceries. He notes that his oxygen saturation drops to around 91-92% during these episodes. He had a repeat CT scan that showed a nodule in the right lung near the pleura which could be inflammatory in nature versus recurrence.  Dr. Arbutus Ped recommended a PET scan to further evaluate this.   Since last being seen the patient denies any major changes in his health.  He denies any fever, chills, night sweats, or unexplained weight loss although he overall has a "mediocre" appetite.  He also reports a sporadic cough, sounding loose, occurring once or twice a week. He denies any chest pain or hemoptysis.  Denies any nausea, or vomiting.  He has chronic issues with alternating between diarrhea and constipation which is not new.  He has been having more intestinal cramping and bloating since having cholecystitis.  He denies any headache or visual changes.  He is here today for evaluation and to review his PET scan results and discuss the next steps in his care.   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      GERD 12/02/2006   Chronic      Grade I diastolic dysfunction 01/20/2022   Grief 28-May-2016   [name] died in Apr 23, 2014   Gynecomastia Dec 27, 2013   Benign B 2015    Hyperlipidemia    Hypertension    Hypothyroidism 12/25/2014   2014/04/23 On Levothyroxine    Intertrigo 02/02/2012   11/13    Neoplasm of uncertain behavior of skin 03/12/2013   12/14 R ear, chest    OSA on CPAP    mild with AHI 9/hr and oxygen desats as low as 75%   Paresis (HCC)    right- s/p cerv decompression   PERIORBITAL CELLULITIS 02/22/2009   Qualifier: Diagnosis of  By: Ermalene Searing MD, Amy     Retinal detachment    L>>R    ALLERGIES:  has no known allergies.  MEDICATIONS:  Current Outpatient Medications  Medication Sig Dispense Refill   amLODipine (NORVASC) 5 MG tablet TAKE 1 TABLET BY MOUTH EVERY DAY 90 tablet 1   apixaban (ELIQUIS) 5 MG TABS tablet Take 1 tablet (5 mg total) by mouth 2 (two) times daily. 60 tablet 11   Cholecalciferol 25 MCG (1000 UT) tablet Take 1,000 Units by mouth daily.     diazepam (VALIUM) 5 MG tablet Take 1 tablet (5 mg total) by mouth every 12 (twelve) hours as needed for anxiety. 180 tablet 1   levothyroxine (SYNTHROID) 137 MCG tablet TAKE 1 TABLET(137 MCG) BY MOUTH DAILY BEFORE BREAKFAST 30 tablet 2   lovastatin (MEVACOR) 20 MG tablet TAKE 1 TABLET BY MOUTH EVERY NIGHT AT BEDTIME 90 tablet 2   Multiple Vitamin (MULTIVITAMIN) tablet Take 1 tablet by mouth daily. Centrum Silver.     Polyethyl Glycol-Propyl Glycol (SYSTANE ULTRA OP) Place 1 drop into both eyes 2 (two) times daily as needed (dry eyes).     Sodium Chloride Flush (NORMAL SALINE FLUSH) 0.9 % SOLN Flush gallbladder drain with 5 - 10 ml once daily 560 mL 3   albuterol (PROVENTIL) (2.5 MG/3ML) 0.083%  nebulizer solution Take 3 mLs (2.5 mg total) by nebulization every 6 (six) hours as needed for wheezing or shortness of breath. (Patient not taking: Reported on 12/16/2022) 75 mL 12   atenolol (TENORMIN) 25 MG tablet Take 1 tablet (25 mg total) by mouth daily as needed (palpitations). (Patient not taking: Reported on 04/07/2023) 90 tablet 0   No current facility-administered medications for this visit.    SURGICAL HISTORY:  Past Surgical History:  Procedure Laterality Date   BICEPS TENDON REPAIR     BRONCHIAL BIOPSY  10/23/2019   Procedure: BRONCHIAL BIOPSIES;  Surgeon: Leslye Peer, MD;  Location: [facility] ENDOSCOPY;  Service: Pulmonary;;   BRONCHIAL BIOPSY  11/20/2019   Procedure: BRONCHIAL BIOPSIES;  Surgeon: Leslye Peer, MD;  Location: [facility] ENDOSCOPY;  Service: Pulmonary;;   BRONCHIAL BRUSHINGS  10/23/2019   Procedure: BRONCHIAL BRUSHINGS;  Surgeon: Leslye Peer, MD;  Location: [facility] ENDOSCOPY;  Service: Pulmonary;;   BRONCHIAL BRUSHINGS  11/20/2019   Procedure: BRONCHIAL BRUSHINGS;  Surgeon: Leslye Peer, MD;  Location: [facility] ENDOSCOPY;  Service: Pulmonary;;   BRONCHIAL NEEDLE ASPIRATION BIOPSY  10/23/2019   Procedure: BRONCHIAL NEEDLE ASPIRATION BIOPSIES;  Surgeon: Leslye Peer, MD;  Location: [facility] ENDOSCOPY;  Service: Pulmonary;;   BRONCHIAL NEEDLE ASPIRATION BIOPSY  11/20/2019   Procedure: BRONCHIAL NEEDLE ASPIRATION BIOPSIES;  Surgeon: Leslye Peer, MD;  Location: [facility] ENDOSCOPY;  Service: Pulmonary;;   BRONCHIAL WASHINGS  11/20/2019   Procedure: BRONCHIAL WASHINGS;  Surgeon: Leslye Peer, MD;  Location: [facility] ENDOSCOPY;  Service: Pulmonary;;   CATARACT EXTRACTION Left    COLONOSCOPY  04-14-99   Dr Herma Ard polyp-TA in epic   IR BALLOON DILATION OF BILIARY DUCTS/AMPULLA  05/04/2022   IR CONVERT BILIARY DRAIN TO INT EXT BILIARY DRAIN  05/04/2022   IR CONVERT BILIARY DRAIN TO INT EXT BILIARY DRAIN  05/27/2022   IR EXCHANGE BILIARY DRAIN  02/02/2022   IR EXCHANGE BILIARY DRAIN  03/19/2022   IR  EXCHANGE BILIARY DRAIN  07/13/2022   IR EXCHANGE BILIARY DRAIN  09/07/2022   IR EXCHANGE BILIARY DRAIN  11/30/2022   IR EXCHANGE BILIARY DRAIN  02/22/2023   IR PERC CHOLECYSTOSTOMY  01/21/2022   IR RADIOLOGIST EVAL & MGMT  04/08/2022   IR RADIOLOGIST EVAL & MGMT  05/17/2022   IR REMOVAL OF CALCULI/DEBRIS BILIARY DUCT/GB  05/04/2022   POLYPECTOMY  04-14-99   POSTERIOR LAMINECTOMY / DECOMPRESSION CERVICAL SPINE     Dr Wynetta Emery   RETINAL DETACHMENT SURGERY     left eye, 2007 x2, 2008 x 3   ROTATOR CUFF REPAIR  2004   right   TONSILLECTOMY  [id number]   VIDEO BRONCHOSCOPY WITH ENDOBRONCHIAL NAVIGATION N/A 10/23/2019   Procedure: VIDEO BRONCHOSCOPY WITH ENDOBRONCHIAL NAVIGATION;  Surgeon: Leslye Peer, MD;  Location: [facility] ENDOSCOPY;  Service: Pulmonary;  Laterality: N/A;   VIDEO BRONCHOSCOPY WITH ENDOBRONCHIAL NAVIGATION N/A 11/20/2019   Procedure: VIDEO BRONCHOSCOPY WITH ENDOBRONCHIAL NAVIGATION;  Surgeon: Leslye Peer, MD;  Location: [facility] ENDOSCOPY;  Service: Pulmonary;  Laterality: N/A;   VIDEO BRONCHOSCOPY WITH ENDOBRONCHIAL ULTRASOUND N/A 10/23/2019   Procedure: VIDEO BRONCHOSCOPY WITH ENDOBRONCHIAL ULTRASOUND;  Surgeon: Leslye Peer, MD;  Location: [facility] ENDOSCOPY;  Service: Pulmonary;  Laterality: N/A;    REVIEW OF SYSTEMS:   Review of Systems  Constitutional: Positive for poor appetite and stable fatigue. Negative for chills, fever and unexpected weight change.  HENT: Negative for mouth sores, nosebleeds, sore throat and trouble swallowing.   Eyes: Negative for eye problems and icterus.  Respiratory: Positive for stable dyspnea on exertion. Mild cough only 1-2x per week.  Negative for hemoptysis, and wheezing.   Cardiovascular: Negative for chest pain and leg swelling.  Gastrointestinal: Chronic alternating between diarrhea and constipation.  Negative for abdominal pain, nausea and vomiting.  Genitourinary: Negative for bladder incontinence, difficulty urinating, dysuria, frequency and hematuria.    Musculoskeletal: Negative for back pain, gait problem, neck pain and neck stiffness.  Skin: Negative for itching and rash.  Neurological: Negative for dizziness, extremity weakness, gait problem, headaches, light-headedness and seizures.  Hematological: Negative for adenopathy. Does not bruise/bleed easily.  Psychiatric/Behavioral: Negative for confusion, depression and sleep disturbance. The patient is not nervous/anxious.  PHYSICAL EXAMINATION:  Blood pressure (!) 135/59, pulse 67, temperature (!) 97 F (36.1 C), temperature source Temporal, resp. rate 20, weight 190 lb 4.8 oz (86.3 kg), SpO2 98%.  ECOG PERFORMANCE STATUS: 1  Physical Exam  Constitutional: Oriented to person, place, and time and well-developed, well-nourished, and in no distress.  HENT:  Head: Normocephalic and atraumatic.  Mouth/Throat: Oropharynx is clear and moist. No oropharyngeal exudate.  Eyes: Conjunctivae are normal. Right eye exhibits no discharge. Left eye exhibits no discharge. No scleral icterus.  Neck: Normal range of motion. Neck supple.  Cardiovascular: Normal rate, irregular rhythm, murmur noted and intact distal pulses.   Pulmonary/Chest: Effort normal.  Quiet breath sounds bilaterally.  On 2 L of supplemental oxygen.  No respiratory distress. No wheezes. No rales.  Abdominal: Soft. Bowel sounds are normal. Exhibits no distension and no mass. There is no tenderness.  Musculoskeletal: Normal range of motion. Exhibits no edema.  Lymphadenopathy:    No cervical adenopathy.  Neurological: Alert and oriented to person, place, and time. Exhibits normal muscle tone. Gait normal. Coordination normal.  Uses a cane to ambulate. Skin: Skin is warm and dry. No rash noted. Not diaphoretic. No erythema. No pallor.  Psychiatric: Mood, memory and judgment normal.  Vitals reviewed.  LABORATORY DATA: Lab Results  Component Value Date   WBC 12.9 (H) 04/07/2023   HGB 14.7 04/07/2023   HCT 45.5 04/07/2023    MCV 89.2 04/07/2023   PLT 304 04/07/2023      Chemistry      Component Value Date/Time   NA 140 04/07/2023 1415   NA 143 03/10/2017 0911   K 4.4 04/07/2023 1415   K 4.7 03/10/2017 0911   CL 103 04/07/2023 1415   CO2 34 (H) 04/07/2023 1415   CO2 30 (H) 03/10/2017 0911   BUN 9 04/07/2023 1415   BUN 10.7 03/10/2017 0911   CREATININE 0.91 04/07/2023 1415   CREATININE 0.8 03/10/2017 0911      Component Value Date/Time   CALCIUM 10.1 04/07/2023 1415   CALCIUM 9.4 03/10/2017 0911   ALKPHOS 82 04/07/2023 1415   ALKPHOS 76 03/10/2017 0911   AST 19 04/07/2023 1415   AST 34 03/10/2017 0911   ALT 12 04/07/2023 1415   ALT 45 03/10/2017 0911   BILITOT 1.0 04/07/2023 1415   BILITOT 1.41 (H) 03/10/2017 0911       RADIOGRAPHIC STUDIES:  No results found.    ASSESSMENT/PLAN:  This is a very pleasant 78 year old Caucasian male with a history of stage IIIb non-small cell lung cancer, squamous cell carcinoma.  He was diagnosed in August 2021.  He also has a history of stage 0 CLL was diagnosed in 2016.  He underwent a course of concurrent chemoradiation with carboplatin for an AUC of 2 and paclitaxel 45 mg/m.  He is status post 5 cycles of treatment.  He then underwent consolidation immunotherapy with Imfinzi 5000 mg IV every 4 weeks for 13 cycles.  He has been on observation since that time.  His December 2024 restaging CT scan showed a new ubpleural right upper lobe, suggesting metastatic disease.  The patient was seen with Dr. Arbutus Ped today.  Dr. Arbutus Ped personally and independently reviewed the scan and discussed results with the patient today.  The scan showed radiation changes in the left upper lobe with an underlying 3.7 cm nodular opacity with hypermetabolism suggesting residual tumor/recurrence and 17 mm irregular nodule in the subpleural right upper lobe suggestive of metastatic disease.  There was no  other evidence of metastatic disease in the abdomen and pelvis.   Dr.  Arbutus Ped recommends referral to radiation oncology for consideration of radiation to the right upper lobe lesion. Dr. Arbutus Ped would also recommend seeing if it is possible for re-radiation to the residual tumor in the left upper lobe.   We will refer the patient back to Dr. Roselind Messier. If he is able to radiate both of these areas, then we will see the patient back for a follow up visit in 4 months after a repeat CT scan. If not, then we will see the patient back sooner to talk about other options, which would be chemotherapy. Chemotherapy may be difficult for this patient in addition to concerns with infection with his biliary drain.    Place order for the patient's repeat CT scan in 4 months.   The patient was advised to call immediately if she has any concerning symptoms in the interval. The patient voices understanding of current disease status and treatment options and is in agreement with the current care plan. All questions were answered. The patient knows to call the clinic with any problems, questions or concerns. We can certainly see the patient much sooner if necessary       Orders Placed This Encounter  Procedures   CT Chest W Contrast    Standing Status:   Future    Expected Date:   07/25/2023    Expiration Date:   04/06/2024    If indicated for the ordered procedure, I authorize the administration of contrast media per Radiology protocol:   Yes    Does the patient have a contrast media/X-ray dye allergy?:   No    Preferred imaging location?:   [facility]   CBC with Differential (Cancer Center Only)    Standing Status:   Future    Expected Date:   07/27/2023    Expiration Date:   04/06/2024   CMP (Cancer Center only)    Standing Status:   Future    Expected Date:   07/27/2023    Expiration Date:   04/06/2024   Ambulatory referral to Radiation Oncology    Referral Priority:   Routine    Referral Type:   Consultation    Referral Reason:   Specialty Services Required     Requested Specialty:   Radiation Oncology    Number of Visits Requested:   1    Camira Geidel L Garion Wempe, PA-C 04/07/23  ADDENDUM: Hematology/Oncology Attending: I had a face-to-face encounter with the patient today.  I reviewed his record, lab, scan and recommended his care plan.  This is a very pleasant 78 years old white male with recurrent non-small cell lung cancer, squamous cell carcinoma that was initially diagnosed as stage IIIb in August 2021 with evidence of recurrence in December 2024.  The patient status post a course of concurrent chemoradiation followed by 1 year treatment of consolidation treatment with Imfinzi completed in November 2022 and he has been on observation since that time.  Recent imaging studies including CT scan of the chest on February 23, 2023 showed 1.5 x 0.9 cm subpleural nodular opacity in the lateral right upper lobe mildly progressive in addition to the radiation changes in the left upper lobe.  The patient had a PET scan on March 07, 2023 and that showed the radiation changes in the left upper lobe with underlying 3.7 cm nodular opacity with hypermetabolism suggesting residual tumor/recurrence.  There was also a 1.7 cm irregular nodule in the  subpleural right upper lobe suggesting metastatic disease but no evidence of metastatic disease in the abdomen or pelvis. I had a lengthy discussion with the patient today about his condition.  I recommended for him to see Dr. Roselind Messier his radiation oncology for consideration of curative radiotherapy to the residual disease in the left upper lobe as well as a new right upper lobe subpleural nodule. We will see the patient back for follow-up visit in 4 months for evaluation with repeat CT scan of the chest for restaging of his disease after completion of the radiotherapy. The patient is in agreement with the current plan. He was advised to call immediately if he has any other concerning symptoms in the interval. The total time  spent in the appointment was 30 minutes. Disclaimer: This note was dictated with voice recognition software. Similar sounding words can inadvertently be transcribed and may be missed upon review. Lajuana Matte, MD

## 2023-04-06 ENCOUNTER — Other Ambulatory Visit: Payer: Self-pay | Admitting: Medical Oncology

## 2023-04-06 DIAGNOSIS — C3490 Malignant neoplasm of unspecified part of unspecified bronchus or lung: Secondary | ICD-10-CM

## 2023-04-07 ENCOUNTER — Inpatient Hospital Stay: Payer: Medicare Other | Admitting: Physician Assistant

## 2023-04-07 ENCOUNTER — Inpatient Hospital Stay: Payer: Medicare Other | Attending: Internal Medicine

## 2023-04-07 ENCOUNTER — Other Ambulatory Visit (HOSPITAL_COMMUNITY): Payer: Self-pay

## 2023-04-07 VITALS — BP 135/59 | HR 67 | Temp 97.0°F | Resp 20 | Wt 190.3 lb

## 2023-04-07 DIAGNOSIS — Z9221 Personal history of antineoplastic chemotherapy: Secondary | ICD-10-CM | POA: Insufficient documentation

## 2023-04-07 DIAGNOSIS — Z87891 Personal history of nicotine dependence: Secondary | ICD-10-CM | POA: Diagnosis not present

## 2023-04-07 DIAGNOSIS — C9110 Chronic lymphocytic leukemia of B-cell type not having achieved remission: Secondary | ICD-10-CM | POA: Insufficient documentation

## 2023-04-07 DIAGNOSIS — Z79899 Other long term (current) drug therapy: Secondary | ICD-10-CM | POA: Diagnosis not present

## 2023-04-07 DIAGNOSIS — R911 Solitary pulmonary nodule: Secondary | ICD-10-CM | POA: Diagnosis not present

## 2023-04-07 DIAGNOSIS — Z923 Personal history of irradiation: Secondary | ICD-10-CM | POA: Insufficient documentation

## 2023-04-07 DIAGNOSIS — C3490 Malignant neoplasm of unspecified part of unspecified bronchus or lung: Secondary | ICD-10-CM

## 2023-04-07 DIAGNOSIS — C3492 Malignant neoplasm of unspecified part of left bronchus or lung: Secondary | ICD-10-CM

## 2023-04-07 DIAGNOSIS — E039 Hypothyroidism, unspecified: Secondary | ICD-10-CM

## 2023-04-07 DIAGNOSIS — C3412 Malignant neoplasm of upper lobe, left bronchus or lung: Secondary | ICD-10-CM | POA: Diagnosis not present

## 2023-04-07 LAB — CBC WITH DIFFERENTIAL (CANCER CENTER ONLY)
Abs Immature Granulocytes: 0.04 10*3/uL (ref 0.00–0.07)
Basophils Absolute: 0.1 10*3/uL (ref 0.0–0.1)
Basophils Relative: 1 %
Eosinophils Absolute: 0.3 10*3/uL (ref 0.0–0.5)
Eosinophils Relative: 2 %
HCT: 45.5 % (ref 39.0–52.0)
Hemoglobin: 14.7 g/dL (ref 13.0–17.0)
Immature Granulocytes: 0 %
Lymphocytes Relative: 19 %
Lymphs Abs: 2.5 10*3/uL (ref 0.7–4.0)
MCH: 28.8 pg (ref 26.0–34.0)
MCHC: 32.3 g/dL (ref 30.0–36.0)
MCV: 89.2 fL (ref 80.0–100.0)
Monocytes Absolute: 1.1 10*3/uL — ABNORMAL HIGH (ref 0.1–1.0)
Monocytes Relative: 8 %
Neutro Abs: 8.9 10*3/uL — ABNORMAL HIGH (ref 1.7–7.7)
Neutrophils Relative %: 70 %
Platelet Count: 304 10*3/uL (ref 150–400)
RBC: 5.1 MIL/uL (ref 4.22–5.81)
RDW: 13.1 % (ref 11.5–15.5)
WBC Count: 12.9 10*3/uL — ABNORMAL HIGH (ref 4.0–10.5)
nRBC: 0 % (ref 0.0–0.2)

## 2023-04-07 LAB — CMP (CANCER CENTER ONLY)
ALT: 12 U/L (ref 0–44)
AST: 19 U/L (ref 15–41)
Albumin: 4.1 g/dL (ref 3.5–5.0)
Alkaline Phosphatase: 82 U/L (ref 38–126)
Anion gap: 3 — ABNORMAL LOW (ref 5–15)
BUN: 9 mg/dL (ref 8–23)
CO2: 34 mmol/L — ABNORMAL HIGH (ref 22–32)
Calcium: 10.1 mg/dL (ref 8.9–10.3)
Chloride: 103 mmol/L (ref 98–111)
Creatinine: 0.91 mg/dL (ref 0.61–1.24)
GFR, Estimated: >60 mL/min (ref >60)
Glucose, Bld: 89 mg/dL (ref 70–99)
Potassium: 4.4 mmol/L (ref 3.5–5.1)
Sodium: 140 mmol/L (ref 135–145)
Total Bilirubin: 1 mg/dL (ref 0.0–1.2)
Total Protein: 7.5 g/dL (ref 6.5–8.1)

## 2023-04-07 LAB — TSH: TSH: 3.425 u[IU]/mL (ref 0.350–4.500)

## 2023-04-08 ENCOUNTER — Telehealth: Payer: Self-pay | Admitting: Radiation Oncology

## 2023-04-08 NOTE — Telephone Encounter (Signed)
 Left message for patient to call back to schedule consult per 1/16 referral.

## 2023-04-12 ENCOUNTER — Ambulatory Visit: Payer: Medicare Other | Admitting: Pulmonary Disease

## 2023-04-15 NOTE — Progress Notes (Signed)
Location of tumor and Histology per Pathology Report: Bilateral upper lobes  Biopsy: last biopsy done in 2021.  Past/Anticipated interventions by surgeon, if any: None at this time.  Past/Anticipated interventions by medical oncology, if any  Dr. Arbutus Ped recommends referral to radiation oncology for consideration of radiation to the right upper lobe lesion. Dr. Arbutus Ped would also recommend seeing if it is possible for re-radiation to the residual tumor in the left upper lobe.    We will refer the patient back to Dr. Roselind Messier. If he is able to radiate both of these areas, then we will see the patient back for a follow up visit in 4 months after a repeat CT scan. If not, then we will see the patient back sooner to talk about other options, which would be chemotherapy. Chemotherapy may be difficult for this patient in addition to concerns with infection with his biliary drain.     Place order for the patient's repeat CT scan in 4 months.   Pain issues, if any:  no    SAFETY ISSUES: Prior radiation? Yes left lung 2021 Pacemaker/ICD? no Possible current pregnancy? no Is the patient on methotrexate? no  Current Complaints / other details:    BP (!) 148/75   Pulse 64   Temp 97.8 F (36.6 C)   Resp 20   Ht 5\' 11"  (1.803 m)   Wt 189 lb 3.2 oz (85.8 kg)   SpO2 100%   PF (!) 2 L/min   BMI 26.39 kg/m

## 2023-04-19 NOTE — Progress Notes (Signed)
Radiation Oncology         (336) [phone] ________________________________  Initial out patient Re-Consultation  Name: Joe [name] MRN: [id number]  Date: 04/20/2023  DOB: 1945-04-09  WU:[id number], [name], MD  Si Gaul, MD   REFERRING PHYSICIAN: Si Gaul, MD  DIAGNOSIS: There were no encounter diagnoses. stage IIIb non-small cell lung cancer, squamous cell carcinoma. Diagnosed in 2021 with recurrence in December 20214.  stage 0 CLL  diagnosed in 2016.   HISTORY OF PRESENT ILLNESS::Joe [name] is a 78 y.o. male who is accompanied by ***. he is seen as a courtesy of Dr. Shirline Frees for an opinion concerning radiation therapy as part of management for his recently recurrent lung cancer. 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 He was last seen here for a follow up on 02-25-2020 after completing radiation treatment.    The patient presented to Dr. Shirline Frees for a routine follow up on 02-28-23 for which he was complaining of increased shortness of breath and decline in respiratory functions despite being on a 2 liters of oxygen. A CT chest done on 02-23-23 was also reviewed. Scan showed a mildly progressive subpleural nodular opacity measuring 15 x 9 cm located in the lateral right upper lobe. Moderate centrilobular and paraseptal emphysematous changes in the upper lung and mild subpleural reticulation/fibrosis in the lower lobe were also indicated.   To further determine the nature of the new nodule, a PET scan was done on 03-07-23 which showed a nodular opacity measuring 3.7 x 2.8 cm at the greatest extend with focal hypermetabolism, suggesting diease recurrence. A 17 mm irregular nodule in the subpleural right upper lobe which suggest's metastatic disease was also indicated on scan. No hypermetabolic thoracic lymphadenopathy was indicated.    He presented for a follow up on 04-07-23 with PA Cassandra  Heilingoetter. At that time, he reported increased dyspnea on exertion. He was previously using 2 liters supplemental oxygen but had increased it to 3 liters during everyday activities such as bringing in groceries. He reported that his oxygen saturation would drop to 91-92% during those activities. He also complained of a sporadic cough that tend to occur once or twice a week. He denied any chest pain or hemoptysis.          No other significant oncologic interval history.   Of note: Of note: recently diagnosed with cholecystitis and endorsed GI symptoms including alternating diarrhea and constipation.     PREVIOUS RADIATION THERAPY: Yes  Radiation Treatment Dates: 12/11/2019 through 01/21/2020 Site Technique Total Dose (Gy) Dose per Fx (Gy) Completed Fx Beam Energies  Lung, Left: Lung_Lt 3D 60/60 2 30/30 6X, 10X, 15X   prior therapy:  1) Concurrent chemoradiation with carboplatin for an AUC of 2 and paclitaxel 45 mg per metered squared weekly.  First dose expected on 12/13/2019. Status post 5 cycles.   Last dose was giving January 14, 2020. 2) Consolidation treatment with immunotherapy with Imfinzi 1500 mg IV every 4 weeks.  First dose February 19, 2020.  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   chronic stage 1- no symtoms   CLL (chronic lymphocytic leukemia) (HCC) 08/08/2014   30-Apr-2014 Dr Mosetta Putt Stage 0   COPD mixed type (HCC) 07/26/2007   Smoker - stopped 6/14    De Quervain's tenosynovitis, right 2013-12-18   2015    Depression    at times   Dysrhythmia    PAF   Dysuria 12/18/13   9/15 - poss stricture Urol ref was offered    Gallstones 11/16/2017    Asymptomatic Pt refused surg ref   Generalized anxiety disorder 09/07/2012   Chronic   Potential benefits of a long term steroid  use as well as potential risks  and complications were explained to the patient and were aknowledged.      GERD 12/02/2006   Chronic      Grade I diastolic dysfunction 01/20/2022   Grief 05/19/16   [name] died in 2014/04/30   Gynecomastia 18-Dec-2013   Benign B 2015    Hyperlipidemia    Hypertension    Hypothyroidism 12/25/2014   04/30/2014 On Levothyroxine    Intertrigo 02/02/2012   11/13    Neoplasm of uncertain behavior of skin 03/12/2013   12/14 R ear, chest    OSA on CPAP    mild with AHI 9/hr and oxygen desats as low as 75%   Paresis (HCC)    right- s/p cerv decompression   PERIORBITAL CELLULITIS 02/22/2009   Qualifier: Diagnosis of  By: Ermalene Searing MD, Amy     Retinal detachment    L>>R    PAST SURGICAL HISTORY: Past Surgical History:  Procedure Laterality Date   BICEPS TENDON REPAIR     BRONCHIAL BIOPSY  10/23/2019   Procedure: BRONCHIAL BIOPSIES;  Surgeon: Leslye Peer, MD;  Location: [facility];  Service: Pulmonary;;   BRONCHIAL BIOPSY  11/20/2019   Procedure: BRONCHIAL BIOPSIES;  Surgeon: Leslye Peer, MD;  Location: [facility];  Service: Pulmonary;;   BRONCHIAL BRUSHINGS  10/23/2019   Procedure: BRONCHIAL BRUSHINGS;  Surgeon: Leslye Peer, MD;  Location: [facility];  Service: Pulmonary;;   BRONCHIAL BRUSHINGS  11/20/2019   Procedure: BRONCHIAL BRUSHINGS;  Surgeon: Leslye Peer, MD;  Location: [facility];  Service: Pulmonary;;   BRONCHIAL NEEDLE ASPIRATION BIOPSY  10/23/2019   Procedure: BRONCHIAL NEEDLE ASPIRATION BIOPSIES;  Surgeon: Leslye Peer, MD;  Location: [facility];  Service: Pulmonary;;   BRONCHIAL NEEDLE ASPIRATION BIOPSY  11/20/2019   Procedure: BRONCHIAL NEEDLE ASPIRATION BIOPSIES;  Surgeon: Leslye Peer, MD;  Location: [facility];  Service: Pulmonary;;   BRONCHIAL WASHINGS  11/20/2019   Procedure: BRONCHIAL  WASHINGS;  Surgeon: Leslye Peer, MD;  Location[facility];  Service: Pulmonary;;   CATARACT EXTRACTION Left    COLONOSCOPY  04-14-99   Dr Herma Ard polyp-TA in epic   IR BALLOON DILATION OF BILIARY DUCTS/AMPULLA  05/04/2022   IR CONVERT BILIARY DRAIN TO INT EXT BILIARY DRAIN  05/04/2022   IR CONVERT BILIARY DRAIN TO INT EXT BILIARY DRAIN  05/27/2022   IR EXCHANGE BILIARY DRAIN  02/02/2022   IR EXCHANGE BILIARY DRAIN  03/19/2022   IR EXCHANGE BILIARY DRAIN  07/13/2022   IR EXCHANGE BILIARY DRAIN  09/07/2022   IR EXCHANGE BILIARY DRAIN  11/30/2022   IR EXCHANGE BILIARY DRAIN  02/22/2023   IR PERC CHOLECYSTOSTOMY  01/21/2022   IR RADIOLOGIST EVAL & MGMT  04/08/2022   IR RADIOLOGIST EVAL & MGMT  05/17/2022   IR REMOVAL OF CALCULI/DEBRIS BILIARY DUCT/GB  05/04/2022   POLYPECTOMY  04-14-99   POSTERIOR LAMINECTOMY / DECOMPRESSION CERVICAL SPINE     Dr Wynetta Emery   RETINAL DETACHMENT SURGERY     left eye, 2007 x2, 2008 x 3   ROTATOR CUFF REPAIR  2004   right   TONSILLECTOMY  [phone]   VIDEO BRONCHOSCOPY WITH ENDOBRONCHIAL NAVIGATION N/A 10/23/2019   Procedure: VIDEO BRONCHOSCOPY WITH ENDOBRONCHIAL NAVIGATION;  Surgeon: Leslye Peer, MD;  Location: [facility];  Service: Pulmonary;  Laterality: N/A;   VIDEO BRONCHOSCOPY WITH ENDOBRONCHIAL NAVIGATION N/A 11/20/2019   Procedure: VIDEO BRONCHOSCOPY WITH ENDOBRONCHIAL NAVIGATION;  Surgeon: Leslye Peer, MD;  Location: [facility];  Service: Pulmonary;  Laterality: N/A;   VIDEO BRONCHOSCOPY WITH ENDOBRONCHIAL ULTRASOUND N/A 10/23/2019   Procedure: VIDEO BRONCHOSCOPY WITH ENDOBRONCHIAL ULTRASOUND;  Surgeon: Leslye Peer, MD;  Location: [facility];  Service: Pulmonary;  Laterality: N/A;    FAMILY HISTORY:  Family History  Problem Relation Age of Onset   COPD Mother    Diabetes Father    Coronary artery disease Other    Breast cancer Paternal Aunt 59   Colon cancer Neg Hx     SOCIAL HISTORY:  Social History   Tobacco Use   Smoking status:  Former    Current packs/day: 0.00    Average packs/day: 0.8 packs/day for 50.0 years (40.0 ttl pk-yrs)    Types: Cigarettes    Start date: 08/28/1962    Quit date: 08/27/2012    Years since quitting: 10.6   Smokeless tobacco: Never  Vaping Use   Vaping status: Never Used  Substance Use Topics   Alcohol use: Not Currently   Drug use: No    ALLERGIES: No Known Allergies  MEDICATIONS:  Current Outpatient Medications  Medication Sig Dispense Refill   albuterol (PROVENTIL) (2.5 MG/3ML) 0.083% nebulizer solution Take 3 mLs (2.5 mg total) by nebulization every 6 (six) hours as needed for wheezing or shortness of breath. (Patient not taking: Reported on 12/16/2022) 75 mL 12   amLODipine (NORVASC) 5 MG tablet TAKE 1 TABLET BY MOUTH EVERY DAY 90 tablet 1   apixaban (ELIQUIS) 5 MG TABS tablet Take 1 tablet (5 mg total) by mouth 2 (two) times daily. 60 tablet 11   atenolol (TENORMIN) 25 MG tablet Take 1 tablet (25 mg total) by mouth daily as needed (palpitations). (Patient not taking: Reported on 04/07/2023) 90 tablet 0   Cholecalciferol 25 MCG (1000 UT) tablet Take 1,000 Units by mouth daily.     diazepam (VALIUM) 5 MG tablet Take 1 tablet (5 mg total) by mouth every 12 (twelve) hours as needed for anxiety. 180 tablet 1   levothyroxine (SYNTHROID) 137 MCG tablet TAKE 1 TABLET(137 MCG) BY MOUTH DAILY BEFORE BREAKFAST 30 tablet 2   lovastatin (MEVACOR) 20 MG tablet TAKE 1 TABLET BY MOUTH EVERY NIGHT AT BEDTIME 90 tablet 2   Multiple Vitamin (MULTIVITAMIN) tablet Take 1 tablet by mouth daily. Centrum Silver.     Polyethyl Glycol-Propyl Glycol (SYSTANE ULTRA OP) Place 1 drop into both eyes 2 (two) times daily as needed (dry eyes).     Sodium Chloride Flush (NORMAL SALINE FLUSH) 0.9 % SOLN Flush gallbladder drain with 5 - 10 ml once daily 560 mL 3   No current facility-administered medications for this encounter.    REVIEW OF SYSTEMS:  A 10+ POINT REVIEW OF SYSTEMS WAS OBTAINED including neurology,  dermatology, psychiatry, cardiac, respiratory, lymph, extremities, GI, GU, musculoskeletal, constitutional, reproductive, HEENT. ***   PHYSICAL EXAM:  vitals were  not taken for this visit.   General: Alert and oriented, in no acute distress HEENT: Head is normocephalic. Extraocular movements are intact. Oropharynx is clear. Neck: Neck is supple, no palpable cervical or supraclavicular lymphadenopathy. Heart: Regular in rate and rhythm with no murmurs, rubs, or gallops. Chest: Clear to auscultation bilaterally, with no rhonchi, wheezes, or rales. Abdomen: Soft, nontender, nondistended, with no rigidity or guarding. Extremities: No cyanosis or edema. Lymphatics: see Neck Exam Skin: No concerning lesions. Musculoskeletal: symmetric strength and muscle tone throughout. Neurologic: Cranial nerves II through XII are grossly intact. No obvious focalities. Speech is fluent. Coordination is intact. Psychiatric: Judgment and insight are intact. Affect is appropriate. ***  ECOG = ***  0 - Asymptomatic (Fully active, able to carry on all predisease activities without restriction)  1 - Symptomatic but completely ambulatory (Restricted in physically strenuous activity but ambulatory and able to carry out work of a light or sedentary nature. For example, light housework, office work)  2 - Symptomatic, <50% in bed during the day (Ambulatory and capable of all self care but unable to carry out any work activities. Up and about more than 50% of waking hours)  3 - Symptomatic, >50% in bed, but not bedbound (Capable of only limited self-care, confined to bed or chair 50% or more of waking hours)  4 - Bedbound (Completely disabled. Cannot carry on any self-care. Totally confined to bed or chair)  5 - Death   Santiago Glad MM, Creech RH, Tormey DC, et al. [phone]). "Toxicity and response criteria of the [facility]". Am. Evlyn Clines. Oncol. 5 (6): 649-55  LABORATORY DATA:  Lab Results  Component  Value Date   WBC 12.9 (H) 04/07/2023   HGB 14.7 04/07/2023   HCT 45.5 04/07/2023   MCV 89.2 04/07/2023   PLT 304 04/07/2023   NEUTROABS 8.9 (H) 04/07/2023   Lab Results  Component Value Date   NA 140 04/07/2023   K 4.4 04/07/2023   CL 103 04/07/2023   CO2 34 (H) 04/07/2023   GLUCOSE 89 04/07/2023   BUN 9 04/07/2023   CREATININE 0.91 04/07/2023   CALCIUM 10.1 04/07/2023      RADIOGRAPHY: No results found.    IMPRESSION: stage IIIb non-small cell lung cancer, squamous cell carcinoma. Diagnosed in 2021 with recurrence in December 20214.  stage 0 CLL  diagnosed in 2016.   ***  Today, I talked to the patient and family about the findings and work-up thus far.  We discussed the natural history of *** and general treatment, highlighting the role of radiotherapy in the management.  We discussed the available radiation techniques, and focused on the details of logistics and delivery.  We reviewed the anticipated acute and late sequelae associated with radiation in this setting.  The patient was encouraged to ask questions that I answered to the best of my ability. *** A patient consent form was discussed and signed.  We retained a copy for our records.  The patient would like to proceed with radiation and will be scheduled for CT simulation.  PLAN: ***    *** minutes of total time was spent for this patient encounter, including preparation, face-to-face counseling with the patient and coordination of care, physical exam, and documentation of the encounter.   ------------------------------------------------  Billie Lade, PhD, MD  This document serves as a record of services personally performed by Antony Blackbird, MD. It was created on his behalf by Herbie Saxon, a trained medical scribe. The creation of  this record is based on the scribe's personal observations and the provider's statements to them. This document has been checked and approved by the attending provider.

## 2023-04-20 ENCOUNTER — Ambulatory Visit
Admission: RE | Admit: 2023-04-20 | Discharge: 2023-04-20 | Disposition: A | Discharge status: Home / Self Care | Payer: Medicare Other | Source: Ambulatory Visit | Attending: Radiation Oncology | Admitting: Radiation Oncology

## 2023-04-20 ENCOUNTER — Encounter: Payer: Self-pay | Admitting: Radiation Oncology

## 2023-04-20 VITALS — BP 148/75 | HR 64 | Temp 97.8°F | Resp 20 | Ht 71.0 in | Wt 189.2 lb

## 2023-04-20 DIAGNOSIS — E785 Hyperlipidemia, unspecified: Secondary | ICD-10-CM | POA: Diagnosis not present

## 2023-04-20 DIAGNOSIS — Z79890 Hormone replacement therapy (postmenopausal): Secondary | ICD-10-CM | POA: Diagnosis not present

## 2023-04-20 DIAGNOSIS — F17210 Nicotine dependence, cigarettes, uncomplicated: Secondary | ICD-10-CM | POA: Diagnosis not present

## 2023-04-20 DIAGNOSIS — Z79899 Other long term (current) drug therapy: Secondary | ICD-10-CM | POA: Insufficient documentation

## 2023-04-20 DIAGNOSIS — J432 Centrilobular emphysema: Secondary | ICD-10-CM | POA: Insufficient documentation

## 2023-04-20 DIAGNOSIS — I10 Essential (primary) hypertension: Secondary | ICD-10-CM | POA: Diagnosis not present

## 2023-04-20 DIAGNOSIS — E039 Hypothyroidism, unspecified: Secondary | ICD-10-CM | POA: Diagnosis not present

## 2023-04-20 DIAGNOSIS — F1010 Alcohol abuse, uncomplicated: Secondary | ICD-10-CM | POA: Insufficient documentation

## 2023-04-20 DIAGNOSIS — Z87891 Personal history of nicotine dependence: Secondary | ICD-10-CM | POA: Insufficient documentation

## 2023-04-20 DIAGNOSIS — C3411 Malignant neoplasm of upper lobe, right bronchus or lung: Secondary | ICD-10-CM

## 2023-04-20 DIAGNOSIS — C9110 Chronic lymphocytic leukemia of B-cell type not having achieved remission: Secondary | ICD-10-CM | POA: Diagnosis not present

## 2023-04-20 DIAGNOSIS — Z803 Family history of malignant neoplasm of breast: Secondary | ICD-10-CM | POA: Insufficient documentation

## 2023-04-20 DIAGNOSIS — K219 Gastro-esophageal reflux disease without esophagitis: Secondary | ICD-10-CM | POA: Diagnosis not present

## 2023-04-20 DIAGNOSIS — G4733 Obstructive sleep apnea (adult) (pediatric): Secondary | ICD-10-CM | POA: Insufficient documentation

## 2023-04-20 DIAGNOSIS — I350 Nonrheumatic aortic (valve) stenosis: Secondary | ICD-10-CM | POA: Diagnosis not present

## 2023-04-20 DIAGNOSIS — Z7901 Long term (current) use of anticoagulants: Secondary | ICD-10-CM | POA: Insufficient documentation

## 2023-04-20 DIAGNOSIS — C3492 Malignant neoplasm of unspecified part of left bronchus or lung: Secondary | ICD-10-CM

## 2023-04-20 DIAGNOSIS — I480 Paroxysmal atrial fibrillation: Secondary | ICD-10-CM | POA: Insufficient documentation

## 2023-04-20 DIAGNOSIS — C3412 Malignant neoplasm of upper lobe, left bronchus or lung: Secondary | ICD-10-CM | POA: Diagnosis not present

## 2023-04-25 ENCOUNTER — Ambulatory Visit
Admission: RE | Admit: 2023-04-25 | Discharge: 2023-04-25 | Disposition: A | Discharge status: Home / Self Care | Payer: Medicare Other | Source: Ambulatory Visit | Attending: Radiation Oncology | Admitting: Radiation Oncology

## 2023-04-25 DIAGNOSIS — C3411 Malignant neoplasm of upper lobe, right bronchus or lung: Secondary | ICD-10-CM | POA: Diagnosis not present

## 2023-04-25 DIAGNOSIS — Z510 Encounter for antineoplastic radiation therapy: Secondary | ICD-10-CM | POA: Insufficient documentation

## 2023-04-25 DIAGNOSIS — F17210 Nicotine dependence, cigarettes, uncomplicated: Secondary | ICD-10-CM | POA: Diagnosis not present

## 2023-04-28 DIAGNOSIS — C3411 Malignant neoplasm of upper lobe, right bronchus or lung: Secondary | ICD-10-CM | POA: Diagnosis not present

## 2023-04-28 DIAGNOSIS — Z510 Encounter for antineoplastic radiation therapy: Secondary | ICD-10-CM | POA: Diagnosis not present

## 2023-04-28 DIAGNOSIS — F17210 Nicotine dependence, cigarettes, uncomplicated: Secondary | ICD-10-CM | POA: Diagnosis not present

## 2023-05-04 ENCOUNTER — Ambulatory Visit: Payer: Medicare Other | Admitting: Radiation Oncology

## 2023-05-05 ENCOUNTER — Ambulatory Visit: Payer: Medicare Other | Admitting: Radiation Oncology

## 2023-05-06 ENCOUNTER — Ambulatory Visit: Payer: Medicare Other | Admitting: Radiation Oncology

## 2023-05-09 ENCOUNTER — Ambulatory Visit: Payer: Medicare Other | Admitting: Radiation Oncology

## 2023-05-10 ENCOUNTER — Other Ambulatory Visit: Payer: Self-pay

## 2023-05-10 ENCOUNTER — Ambulatory Visit: Payer: Medicare Other | Admitting: Internal Medicine

## 2023-05-10 ENCOUNTER — Ambulatory Visit: Payer: Medicare Other

## 2023-05-10 ENCOUNTER — Ambulatory Visit
Admission: RE | Admit: 2023-05-10 | Discharge: 2023-05-10 | Disposition: A | Discharge status: Home / Self Care | Payer: Medicare Other | Source: Ambulatory Visit | Attending: Radiation Oncology | Admitting: Radiation Oncology

## 2023-05-10 DIAGNOSIS — C3411 Malignant neoplasm of upper lobe, right bronchus or lung: Secondary | ICD-10-CM | POA: Diagnosis not present

## 2023-05-10 DIAGNOSIS — C3492 Malignant neoplasm of unspecified part of left bronchus or lung: Secondary | ICD-10-CM

## 2023-05-10 DIAGNOSIS — Z510 Encounter for antineoplastic radiation therapy: Secondary | ICD-10-CM | POA: Diagnosis not present

## 2023-05-10 LAB — RAD ONC ARIA SESSION SUMMARY
Course Elapsed Days: 0
Plan Fractions Treated to Date: 1
Plan Prescribed Dose Per Fraction: 18 Gy
Plan Total Fractions Prescribed: 3
Plan Total Prescribed Dose: 54 Gy
Reference Point Dosage Given to Date: 18 Gy
Reference Point Session Dosage Given: 18 Gy
Session Number: 1

## 2023-05-13 ENCOUNTER — Ambulatory Visit
Admission: RE | Admit: 2023-05-13 | Discharge: 2023-05-13 | Disposition: A | Discharge status: Home / Self Care | Payer: Medicare Other | Source: Ambulatory Visit | Attending: Radiation Oncology | Admitting: Radiation Oncology

## 2023-05-13 ENCOUNTER — Other Ambulatory Visit: Payer: Self-pay

## 2023-05-13 DIAGNOSIS — Z510 Encounter for antineoplastic radiation therapy: Secondary | ICD-10-CM | POA: Diagnosis not present

## 2023-05-13 DIAGNOSIS — C3411 Malignant neoplasm of upper lobe, right bronchus or lung: Secondary | ICD-10-CM | POA: Diagnosis not present

## 2023-05-13 LAB — RAD ONC ARIA SESSION SUMMARY
Course Elapsed Days: 3
Plan Fractions Treated to Date: 2
Plan Prescribed Dose Per Fraction: 18 Gy
Plan Total Fractions Prescribed: 3
Plan Total Prescribed Dose: 54 Gy
Reference Point Dosage Given to Date: 36 Gy
Reference Point Session Dosage Given: 18 Gy
Session Number: 2

## 2023-05-17 ENCOUNTER — Ambulatory Visit
Admission: RE | Admit: 2023-05-17 | Discharge: 2023-05-17 | Disposition: A | Discharge status: Home / Self Care | Payer: Medicare Other | Source: Ambulatory Visit | Attending: Radiation Oncology | Admitting: Radiation Oncology

## 2023-05-17 ENCOUNTER — Other Ambulatory Visit (HOSPITAL_COMMUNITY): Payer: Medicare Other

## 2023-05-17 ENCOUNTER — Other Ambulatory Visit: Payer: Self-pay

## 2023-05-17 DIAGNOSIS — C3411 Malignant neoplasm of upper lobe, right bronchus or lung: Secondary | ICD-10-CM | POA: Diagnosis not present

## 2023-05-17 DIAGNOSIS — C3492 Malignant neoplasm of unspecified part of left bronchus or lung: Secondary | ICD-10-CM

## 2023-05-17 DIAGNOSIS — Z510 Encounter for antineoplastic radiation therapy: Secondary | ICD-10-CM | POA: Diagnosis not present

## 2023-05-17 DIAGNOSIS — F17210 Nicotine dependence, cigarettes, uncomplicated: Secondary | ICD-10-CM | POA: Diagnosis not present

## 2023-05-17 LAB — RAD ONC ARIA SESSION SUMMARY
Course Elapsed Days: 7
Plan Fractions Treated to Date: 3
Plan Prescribed Dose Per Fraction: 18 Gy
Plan Total Fractions Prescribed: 3
Plan Total Prescribed Dose: 54 Gy
Reference Point Dosage Given to Date: 54 Gy
Reference Point Session Dosage Given: 18 Gy
Session Number: 3

## 2023-05-18 NOTE — Radiation Completion Notes (Addendum)
  Radiation Oncology         (336) [phone] ________________________________  Name: SEITH AIKEY MRN: [id number]  Date of Service: 05/17/2023  DOB: [birth year]  End of Treatment Note    Diagnosis:Stage III (cT3, cN2, cM0) squamous cell carcinoma of left lung     ==========DELIVERED PLANS==========  First Treatment Date: 2023-05-10 Last Treatment Date: 2023-05-17   Plan Name: Lung_R_SBRT Site: Lung, Right Technique: SBRT/SRT-IMRT Mode: Photon Dose Per Fraction: 18 Gy Prescribed Dose (Delivered / Prescribed): 54 Gy / 54 Gy Prescribed Fxs (Delivered / Prescribed): 3 / 3     ====================================   The patient tolerated radiation well. He developed fatigue, but otherwise developed no other symptoms.   The patient will return in one month and continue to follow-up with Dr. Arbutus Ped.      Joyice Faster, PA-C

## 2023-05-20 ENCOUNTER — Encounter: Payer: Self-pay | Admitting: Internal Medicine

## 2023-05-20 ENCOUNTER — Ambulatory Visit: Payer: Medicare Other | Admitting: Internal Medicine

## 2023-05-20 VITALS — BP 118/76 | HR 72 | Temp 98.0°F | Ht 71.0 in | Wt 188.0 lb

## 2023-05-20 DIAGNOSIS — R911 Solitary pulmonary nodule: Secondary | ICD-10-CM | POA: Diagnosis not present

## 2023-05-20 DIAGNOSIS — F419 Anxiety disorder, unspecified: Secondary | ICD-10-CM | POA: Diagnosis not present

## 2023-05-20 DIAGNOSIS — F411 Generalized anxiety disorder: Secondary | ICD-10-CM

## 2023-05-20 DIAGNOSIS — I480 Paroxysmal atrial fibrillation: Secondary | ICD-10-CM | POA: Diagnosis not present

## 2023-05-20 DIAGNOSIS — D6869 Other thrombophilia: Secondary | ICD-10-CM | POA: Diagnosis not present

## 2023-05-20 DIAGNOSIS — R0609 Other forms of dyspnea: Secondary | ICD-10-CM

## 2023-05-20 DIAGNOSIS — E785 Hyperlipidemia, unspecified: Secondary | ICD-10-CM | POA: Diagnosis not present

## 2023-05-20 DIAGNOSIS — C3492 Malignant neoplasm of unspecified part of left bronchus or lung: Secondary | ICD-10-CM

## 2023-05-20 DIAGNOSIS — J449 Chronic obstructive pulmonary disease, unspecified: Secondary | ICD-10-CM

## 2023-05-20 MED ORDER — DIAZEPAM 5 MG PO TABS: 5.0000 mg | ORAL_TABLET | Freq: Two times a day (BID) | ORAL | 1 refills | Status: AC | PRN

## 2023-05-20 NOTE — Assessment & Plan Note (Addendum)
 On O2 Worse Prednisone taper was offered - he declined

## 2023-05-20 NOTE — Assessment & Plan Note (Signed)
On Diazepam prn  Potential benefits of a long term benzodiazepines  use as well as potential risks  and complications were explained to the patient and were aknowledged.  

## 2023-05-20 NOTE — Assessment & Plan Note (Signed)
Chronic Diazepam prn  Potential benefits of a long term benzodiazepines  use as well as potential risks  and complications were explained to the patient and were aknowledged. 

## 2023-05-20 NOTE — Assessment & Plan Note (Signed)
 04/2023 S/p XRT 3/3 treatments

## 2023-05-20 NOTE — Assessment & Plan Note (Addendum)
  Worse Prednisone taper was offered - he declined

## 2023-05-20 NOTE — Assessment & Plan Note (Addendum)
 04/2023 S/p XRT 3/3 treatments

## 2023-05-20 NOTE — Progress Notes (Signed)
 Subjective:  Patient ID: Joe [name], male    DOB: October 22, 1945  Age: 78 y.o. MRN: [id number]  CC: Medical Management of Chronic Issues (4 MNTH F/U, Some spinal pain where Fractures in vertebrae's when bending and sometimes when walking. )   HPI [name] presents for LBP, DOE - worse, HTN, lung cancer w/mets, COPD On XRT now.   Outpatient Medications Prior to Visit  Medication Sig Dispense Refill   amLODipine (NORVASC) 5 MG tablet TAKE 1 TABLET BY MOUTH EVERY DAY 90 tablet 1   apixaban (ELIQUIS) 5 MG TABS tablet Take 1 tablet (5 mg total) by mouth 2 (two) times daily. 60 tablet 11   Cholecalciferol 25 MCG (1000 UT) tablet Take 1,000 Units by mouth daily.     levothyroxine (SYNTHROID) 137 MCG tablet TAKE 1 TABLET(137 MCG) BY MOUTH DAILY BEFORE BREAKFAST 30 tablet 2   lovastatin (MEVACOR) 20 MG tablet TAKE 1 TABLET BY MOUTH EVERY NIGHT AT BEDTIME 90 tablet 2   Multiple Vitamin (MULTIVITAMIN) tablet Take 1 tablet by mouth daily. Centrum Silver.     Polyethyl Glycol-Propyl Glycol (SYSTANE ULTRA OP) Place 1 drop into both eyes 2 (two) times daily as needed (dry eyes).     Sodium Chloride Flush (NORMAL SALINE FLUSH) 0.9 % SOLN Flush gallbladder drain with 5 - 10 ml once daily 560 mL 3   diazepam (VALIUM) 5 MG tablet Take 1 tablet (5 mg total) by mouth every 12 (twelve) hours as needed for anxiety. 180 tablet 1   albuterol (PROVENTIL) (2.5 MG/3ML) 0.083% nebulizer solution Take 3 mLs (2.5 mg total) by nebulization every 6 (six) hours as needed for wheezing or shortness of breath. (Patient not taking: Reported on 12/16/2022) 75 mL 12   atenolol (TENORMIN) 25 MG tablet Take 1 tablet (25 mg total) by mouth daily as needed (palpitations). (Patient not taking: Reported on 04/07/2023) 90 tablet 0   No facility-administered medications prior to visit.    ROS: Review of Systems  Constitutional:  Negative for appetite change, fatigue and unexpected weight change.  HENT:  Negative for  congestion, nosebleeds, sneezing, sore throat, trouble swallowing and voice change.   Eyes:  Negative for itching and visual disturbance.  Respiratory:  Positive for shortness of breath. Negative for cough.   Cardiovascular:  Negative for chest pain, palpitations and leg swelling.  Gastrointestinal:  Negative for abdominal distention, blood in stool, diarrhea and nausea.  Genitourinary:  Negative for frequency and hematuria.  Musculoskeletal:  Positive for arthralgias, back pain and gait problem. Negative for joint swelling and neck pain.  Skin:  Negative for rash.  Neurological:  Negative for dizziness, tremors, speech difficulty and weakness.  Hematological:  Bruises/bleeds easily.  Psychiatric/Behavioral:  Negative for agitation, dysphoric mood, sleep disturbance and suicidal ideas. The patient is not nervous/anxious.     Objective:  BP 118/76   Pulse 72   Temp 98 F (36.7 C) (Oral)   Ht 5\' 11"  (1.803 m)   Wt 188 lb (85.3 kg)   SpO2 92%   BMI 26.22 kg/m   BP Readings from Last 3 Encounters:  05/20/23 118/76  04/20/23 (!) 148/75  04/07/23 (!) 135/59    Wt Readings from Last 3 Encounters:  05/20/23 188 lb (85.3 kg)  04/20/23 189 lb 3.2 oz (85.8 kg)  04/07/23 190 lb 4.8 oz (86.3 kg)    Physical Exam Constitutional:      General: He is not in acute distress.    Appearance: He is well-developed. He  is obese.     Comments: NAD  Eyes:     Conjunctiva/sclera: Conjunctivae normal.     Pupils: Pupils are equal, round, and reactive to light.  Neck:     Thyroid: No thyromegaly.     Vascular: No JVD.  Cardiovascular:     Rate and Rhythm: Normal rate and regular rhythm.     Heart sounds: Normal heart sounds. No murmur heard.    No friction rub. No gallop.  Pulmonary:     Effort: Pulmonary effort is normal. No respiratory distress.     Breath sounds: Normal breath sounds. No wheezing or rales.  Chest:     Chest wall: No tenderness.  Abdominal:     General: Bowel sounds  are normal. There is no distension.     Palpations: Abdomen is soft. There is no mass.     Tenderness: There is no abdominal tenderness. There is no guarding or rebound.  Musculoskeletal:        General: No tenderness. Normal range of motion.     Cervical back: Normal range of motion.  Lymphadenopathy:     Cervical: No cervical adenopathy.  Skin:    General: Skin is warm and dry.     Findings: No rash.  Neurological:     Mental Status: He is alert and oriented to person, place, and time.     Cranial Nerves: No cranial nerve deficit.     Motor: No abnormal muscle tone.     Coordination: Coordination normal.     Gait: Gait normal.     Deep Tendon Reflexes: Reflexes are normal and symmetric.  Psychiatric:        Behavior: Behavior normal.        Thought Content: Thought content normal.        Judgment: Judgment normal.   On O2 Biliary drain  Lab Results  Component Value Date   WBC 12.9 (H) 04/07/2023   HGB 14.7 04/07/2023   HCT 45.5 04/07/2023   PLT 304 04/07/2023   GLUCOSE 89 04/07/2023   CHOL 153 11/23/2018   TRIG 201.0 (H) 11/23/2018   HDL 38.20 (L) 11/23/2018   LDLDIRECT 99.0 11/23/2018   LDLCALC 80 11/07/2017   ALT 12 04/07/2023   AST 19 04/07/2023   NA 140 04/07/2023   K 4.4 04/07/2023   CL 103 04/07/2023   CREATININE 0.91 04/07/2023   BUN 9 04/07/2023   CO2 34 (H) 04/07/2023   TSH 3.425 04/07/2023   PSA 0.06 (L) 07/22/2021   INR 1.0 05/04/2022    No results found.  Assessment & Plan:   Problem List Items Addressed This Visit     Dyslipidemia   Cont on Lovastatin      COPD mixed type (HCC)   On O2 Worse Prednisone taper was offered - he declined      Generalized anxiety disorder   Chronic  Diazepam prn  Potential benefits of a long term benzodiazepines  use as well as potential risks  and complications were explained to the patient and were aknowledged.      Relevant Medications   diazepam (VALIUM) 5 MG tablet   DOE (dyspnea on exertion) -  Primary    Worse Prednisone taper was offered - he declined      Paroxysmal atrial fibrillation (HCC)   On Eliquis       Secondary hypercoagulable state (HCC)   Cont on Eliquis      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)  04/2023 S/p XRT 3/3 treatments      Relevant Medications   diazepam (VALIUM) 5 MG tablet   Nodule of right lung   04/2023 S/p XRT 3/3 treatments      Anxiety   On Diazepam prn  Potential benefits of a long term benzodiazepines  use as well as potential risks  and complications were explained to the patient and were aknowledged.      Relevant Medications   diazepam (VALIUM) 5 MG tablet      Meds ordered this encounter  Medications   diazepam (VALIUM) 5 MG tablet    Sig: Take 1 tablet (5 mg total) by mouth every 12 (twelve) hours as needed for anxiety.    Dispense:  180 tablet    Refill:  1      Follow-up: Return in about 6 months (around 11/17/2023) for a follow-up visit.  Sonda Primes, MD

## 2023-05-20 NOTE — Assessment & Plan Note (Signed)
 On Eliquis

## 2023-05-20 NOTE — Assessment & Plan Note (Signed)
 Cont on Lovastatin

## 2023-05-20 NOTE — Assessment & Plan Note (Signed)
Cont on Eliquis- 

## 2023-05-24 ENCOUNTER — Other Ambulatory Visit (HOSPITAL_COMMUNITY): Payer: Self-pay | Admitting: Interventional Radiology

## 2023-05-24 ENCOUNTER — Ambulatory Visit (HOSPITAL_COMMUNITY)
Admission: RE | Admit: 2023-05-24 | Discharge: 2023-05-24 | Disposition: A | Discharge status: Home / Self Care | Payer: Medicare Other | Source: Ambulatory Visit | Attending: Interventional Radiology | Admitting: Interventional Radiology

## 2023-05-24 DIAGNOSIS — K810 Acute cholecystitis: Secondary | ICD-10-CM

## 2023-05-24 DIAGNOSIS — Z434 Encounter for attention to other artificial openings of digestive tract: Secondary | ICD-10-CM | POA: Diagnosis not present

## 2023-05-24 HISTORY — PX: IR EXCHANGE BILIARY DRAIN: IMG6046

## 2023-05-24 MED ORDER — IOHEXOL 300 MG/ML  SOLN
50.0000 mL | Freq: Once | INTRAMUSCULAR | Status: AC | PRN
  Administered 2023-05-24: 10 mL

## 2023-05-24 MED ORDER — LIDOCAINE-EPINEPHRINE 1 %-1:100000 IJ SOLN
20.0000 mL | Freq: Once | INTRAMUSCULAR | Status: AC
  Administered 2023-05-24: 2 mL via INTRADERMAL

## 2023-05-24 MED ORDER — LIDOCAINE-EPINEPHRINE 1 %-1:100000 IJ SOLN
INTRAMUSCULAR | Status: AC
  Filled 2023-05-24: qty 1

## 2023-05-26 ENCOUNTER — Other Ambulatory Visit: Payer: Self-pay | Admitting: Radiology

## 2023-05-26 NOTE — Addendum Note (Signed)
 Encounter addended by: Erven Colla, PA-C on: 05/26/2023 3:12 PM  Actions taken: Clinical Note Signed

## 2023-05-30 ENCOUNTER — Other Ambulatory Visit (HOSPITAL_COMMUNITY): Payer: Self-pay

## 2023-06-08 ENCOUNTER — Ambulatory Visit: Payer: Medicare Other | Admitting: Pulmonary Disease

## 2023-06-08 ENCOUNTER — Encounter: Payer: Self-pay | Admitting: Pulmonary Disease

## 2023-06-08 VITALS — BP 146/78 | HR 65 | Ht 71.0 in | Wt 186.0 lb

## 2023-06-08 DIAGNOSIS — J9611 Chronic respiratory failure with hypoxia: Secondary | ICD-10-CM | POA: Diagnosis not present

## 2023-06-08 DIAGNOSIS — J449 Chronic obstructive pulmonary disease, unspecified: Secondary | ICD-10-CM

## 2023-06-08 MED ORDER — ALBUTEROL SULFATE (2.5 MG/3ML) 0.083% IN NEBU: 2.5000 mg | INHALATION_SOLUTION | Freq: Four times a day (QID) | RESPIRATORY_TRACT | 12 refills | Status: AC | PRN

## 2023-06-08 NOTE — Patient Instructions (Addendum)
 Continue albuterol nebulizer treatments as needed  We will follow up the CT scan that Dr. Arbutus Ped has ordered  Follow up in 6 months

## 2023-06-08 NOTE — Progress Notes (Signed)
 Synopsis: Hx of COPD, lung cancer  Subjective:   PATIENT ID: Joe [name] GENDER: male DOB: Jun 29, 1945, MRN: [id number]  HPI  Chief Complaint  Patient presents with   Follow-up    PT states since last april he's getting more SOB    77yM with stage IIIb NSCLC, squamous cell carcinoma dx 10/2019 who originally presented with LUL mass and mediastinal LAD, contralateral R hilar hypermetabolism wh ois s/p chemoXRT now on consolidation IT with imfinzi s/p 13 cycles, CLL dx July 11, 2014, COPD, pAF, hypothyroid, GERD, OSA on CPAP.   He experiences worsening breathing difficulties, particularly when bending over or performing activities such as taking out the garbage or bringing in groceries. He uses a CPAP machine and has decreased oxygen saturation levels, previously in the upper 90s, now dropping to the low 90s or high 80s during certain activities. No current wheezing or persistent cough, though he experienced a sinus infection recently, causing congestion and a temporary cough. Breathing issues are exacerbated by physical inactivity and positional changes, such as bending over.  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 He underwent six weeks of concurrent chemoradiotherapy followed by a year of durvalumab, completing this treatment in October 2022. He experienced pneumonitis post-treatment, which led to his initial consultation with Dr. Daphane Shepherd. Recent imaging in December showed two nodules, one of which increased in size, leading to three radiation treatments. He is scheduled for a follow-up CT scan in May.  He also has a history of atrial fibrillation and sleep apnea. He uses oxygen therapy and has previously been on steroids for respiratory failure. He has not used albuterol in over a year and a half and is not currently on any inhalers.  In 2021-07-10, he had a gallbladder infection and was deemed high risk for surgery, leading to the placement  of a biliary stent, which requires replacement every 12 weeks.  He experiences limitations in physical activity due to the stent and reports missing his previous exercise routine, which included weights and a stationary bike.  Past Medical History:  Diagnosis Date   Alcohol abuse, daily use 08/24/2010   Stopped 2015    Aortic stenosis    mild with mean AVG by echo 5/24   Arthralgia January 04, 2014   9/15 Not related to statins OA    Ascending aorta dilatation (HCC)    42mm by echo 5/24   Cataract    Chronic lymphocytic leukemia (HCC) 07/18/2014   chronic stage 1- no symtoms   CLL (chronic lymphocytic leukemia) (HCC) 08/08/2014   2014/07/11 Dr Mosetta Putt Stage 0   COPD mixed type (HCC) 07/26/2007   Smoker - stopped 6/14    De Quervain's tenosynovitis, right January 04, 2014   2015    Depression    at times   Dysrhythmia    PAF   Dysuria 2014-01-04   9/15 - poss stricture Urol ref was offered    Gallstones 11/16/2017   Asymptomatic Pt refused surg ref   Generalized anxiety disorder 09/07/2012   Chronic   Potential benefits of a long term steroid  use as well as potential risks  and complications were explained to the patient and were aknowledged.      GERD 12/02/2006   Chronic      Grade I diastolic dysfunction 01/20/2022   Grief 2016-06-05   [name] died in 07-11-2014   Gynecomastia 01-04-14   Benign B 2015    Hyperlipidemia    Hypertension  Hypothyroidism 12/25/2014   2016 On Levothyroxine    Intertrigo 02/02/2012   11/13    Neoplasm of uncertain behavior of skin 03/12/2013   12/14 R ear, chest    OSA on CPAP    mild with AHI 9/hr and oxygen desats as low as 75%   Paresis (HCC)    right- s/p cerv decompression   PERIORBITAL CELLULITIS 02/22/2009   Qualifier: Diagnosis of  By: Ermalene Searing MD, Amy     Retinal detachment    L>>R     Family History  Problem Relation Age of Onset   COPD Mother    Diabetes Father    Cancer Paternal Aunt        breast cancer   Breast cancer Paternal  Aunt 94   Coronary artery disease Other    Colon cancer Neg Hx      Social History   Socioeconomic History   Marital status: Widowed    Spouse name: Not on file   Number of children: 1   Years of education: Not on file   Highest education level: Not on file  Occupational History   Occupation: retired  Tobacco Use   Smoking status: Former    Current packs/day: 0.00    Average packs/day: 0.8 packs/day for 50.0 years (40.0 ttl pk-yrs)    Types: Cigarettes    Start date: 08/28/1962    Quit date: 08/27/2012    Years since quitting: 10.7   Smokeless tobacco: Never  Vaping Use   Vaping status: Never Used  Substance and Sexual Activity   Alcohol use: Not Currently   Drug use: No   Sexual activity: Not Currently  Other Topics Concern   Not on file  Social History Narrative   Lives alone   Social Drivers of Health   Financial Resource Strain: Low Risk  (12/16/2022)   Overall Financial Resource Strain (CARDIA)    Difficulty of Paying Living Expenses: Not hard at all  Food Insecurity: No Food Insecurity (04/20/2023)   Hunger Vital Sign    Worried About Running Out of Food in the Last Year: Never true    Ran Out of Food in the Last Year: Never true  Transportation Needs: No Transportation Needs (12/16/2022)   PRAPARE - Administrator, Civil Service (Medical): No    Lack of Transportation (Non-Medical): No  Physical Activity: Inactive (12/16/2022)   Exercise Vital Sign    Days of Exercise per Week: 0 days    Minutes of Exercise per Session: 0 min  Stress: No Stress Concern Present (12/16/2022)   [employer] of Occupational Health - Occupational Stress Questionnaire    Feeling of Stress : Not at all  Social Connections: Moderately Integrated (12/16/2022)   Social Connection and Isolation Panel [NHANES]    Frequency of Communication with Friends and Family: More than three times a week    Frequency of Social Gatherings with Friends and Family: More than three times a  week    Attends Religious Services: More than 4 times per year    Active Member of [employer] or Organizations: Yes    Attends Banker Meetings: 1 to 4 times per year    Marital Status: Widowed  Intimate Partner Violence: Not At Risk (04/20/2023)   Humiliation, Afraid, Rape, and Kick questionnaire    Fear of Current or Ex-Partner: No    Emotionally Abused: No    Physically Abused: No    Sexually Abused: No     No Known  Allergies   Outpatient Medications Prior to Visit  Medication Sig Dispense Refill   amLODipine (NORVASC) 5 MG tablet TAKE 1 TABLET BY MOUTH EVERY DAY 90 tablet 1   apixaban (ELIQUIS) 5 MG TABS tablet Take 1 tablet (5 mg total) by mouth 2 (two) times daily. 60 tablet 11   atenolol (TENORMIN) 25 MG tablet Take 1 tablet (25 mg total) by mouth daily as needed (palpitations). 90 tablet 0   Cholecalciferol 25 MCG (1000 UT) tablet Take 1,000 Units by mouth daily.     diazepam (VALIUM) 5 MG tablet Take 1 tablet (5 mg total) by mouth every 12 (twelve) hours as needed for anxiety. 180 tablet 1   levothyroxine (SYNTHROID) 137 MCG tablet TAKE 1 TABLET(137 MCG) BY MOUTH DAILY BEFORE BREAKFAST 30 tablet 2   lovastatin (MEVACOR) 20 MG tablet TAKE 1 TABLET BY MOUTH EVERY NIGHT AT BEDTIME 90 tablet 2   Multiple Vitamin (MULTIVITAMIN) tablet Take 1 tablet by mouth daily. Centrum Silver.     Polyethyl Glycol-Propyl Glycol (SYSTANE ULTRA OP) Place 1 drop into both eyes 2 (two) times daily as needed (dry eyes).     Sodium Chloride Flush (NORMAL SALINE FLUSH) 0.9 % SOLN Flush gallbladder drain with 5 - 10 ml once daily 560 mL 3   albuterol (PROVENTIL) (2.5 MG/3ML) 0.083% nebulizer solution Take 3 mLs (2.5 mg total) by nebulization every 6 (six) hours as needed for wheezing or shortness of breath. 75 mL 12   No facility-administered medications prior to visit.   Review of Systems  Constitutional:  Negative for chills, fever, malaise/fatigue and weight loss.  HENT:  Negative for  congestion, sinus pain and sore throat.   Eyes: Negative.   Respiratory:  Negative for cough, hemoptysis, sputum production, shortness of breath and wheezing.   Cardiovascular:  Negative for chest pain, palpitations, orthopnea, claudication and leg swelling.  Gastrointestinal:  Negative for abdominal pain, heartburn, nausea and vomiting.  Genitourinary: Negative.   Musculoskeletal:  Negative for joint pain and myalgias.  Skin:  Negative for rash.  Neurological:  Negative for weakness.  Endo/Heme/Allergies: Negative.   Psychiatric/Behavioral: Negative.     Objective:   Vitals:   06/08/23 1525  BP: (!) 146/78  Pulse: 65  SpO2: 96%  Weight: 186 lb (84.4 kg)  Height: 5\' 11"  (1.803 m)   Physical Exam Constitutional:      General: He is not in acute distress.    Appearance: Normal appearance.  Eyes:     General: No scleral icterus.    Conjunctiva/sclera: Conjunctivae normal.  Cardiovascular:     Rate and Rhythm: Normal rate and regular rhythm.  Pulmonary:     Breath sounds: No wheezing, rhonchi or rales.  Musculoskeletal:     Right lower leg: No edema.     Left lower leg: No edema.  Skin:    General: Skin is warm and dry.  Neurological:     General: No focal deficit present.    CBC    Component Value Date/Time   WBC 12.9 (H) 04/07/2023 1415   WBC 11.2 (H) 07/06/2022 1356   RBC 5.10 04/07/2023 1415   HGB 14.7 04/07/2023 1415   HGB 17.2 (H) 03/10/2017 0911   HCT 45.5 04/07/2023 1415   HCT 52.5 (H) 03/10/2017 0911   PLT 304 04/07/2023 1415   PLT 241 03/10/2017 0911   MCV 89.2 04/07/2023 1415   MCV 96.7 03/10/2017 0911   MCH 28.8 04/07/2023 1415   MCHC 32.3 04/07/2023 1415   RDW  13.1 04/07/2023 1415   RDW 13.6 03/10/2017 0911   LYMPHSABS 2.5 04/07/2023 1415   LYMPHSABS 9.8 (H) 03/10/2017 0911   MONOABS 1.1 (H) 04/07/2023 1415   MONOABS 1.0 (H) 03/10/2017 0911   EOSABS 0.3 04/07/2023 1415   EOSABS 0.3 03/10/2017 0911   BASOSABS 0.1 04/07/2023 1415   BASOSABS  0.1 03/10/2017 0911      Latest Ref Rng & Units 04/07/2023    2:15 PM 02/23/2023    9:52 AM 08/24/2022   10:59 AM  BMP  Glucose 70 - 99 mg/dL 89  161  096   BUN 8 - 23 mg/dL 9  11  7    Creatinine 0.61 - 1.24 mg/dL 0.45  4.09  8.11   Sodium 135 - 145 mmol/L 140  141  139   Potassium 3.5 - 5.1 mmol/L 4.4  3.9  4.0   Chloride 98 - 111 mmol/L 103  103  103   CO2 22 - 32 mmol/L 34  33  31   Calcium 8.9 - 10.3 mg/dL 91.4  78.2  9.8    Chest imaging: CT PET 03/07/23 Radiation changes in the left upper lobe. Underlying 3.7 cm nodular opacity with hypermetabolism, suggesting residual tumor/recurrence.   17 mm irregular nodule in the subpleural right upper lobe, suggesting metastatic disease.   No evidence of metastatic disease in the abdomen/pelvis.   Status post percutaneous cholecystostomy.  CT Chest 02/23/23 Mediastinum/Nodes: No suspicious mediastinal lymphadenopathy.   Lungs/Pleura: Radiation changes with volume loss in the left upper lobe.   15 x 9 mm subpleural nodular opacity in the lateral right upper lobe, previously 12 x 8 mm. Additional 5 mm triangular subpleural nodule in the right upper lobe (series 6/image 31), unchanged, likely reflecting a benign subpleural lymph node.   Moderate centrilobular and paraseptal emphysematous changes, upper lung predominant.   Mild subpleural reticulation/fibrosis, lower lobe predominant.   No focal consolidation.   No pleural effusion or pneumothorax.  PFT:    Latest Ref Rng & Units 05/05/2021   11:48 AM 01/02/2020    1:47 PM  PFT Results  FVC-Pre L 2.44  2.61   FVC-Predicted Pre % 55  58   FVC-Post L 2.75  3.13   FVC-Predicted Post % 62  69   Pre FEV1/FVC % % 52  54   Post FEV1/FCV % % 52  53   FEV1-Pre L 1.27  1.41   FEV1-Predicted Pre % 39  43   FEV1-Post L 1.42  1.66   DLCO uncorrected ml/min/mmHg 13.56  16.70   DLCO UNC% % 52  63   DLCO corrected ml/min/mmHg 13.56  16.56   DLCO COR %Predicted % 52  63   DLVA  Predicted % 77  84   TLC L 6.02  6.87   TLC % Predicted % 83  94   RV % Predicted % 119  153     Labs:  Path:  Echo:  Heart Catheterization:    Assessment & Plan:   COPD, group B, by GOLD 2017 classification (HCC) - Plan: albuterol (PROVENTIL) (2.5 MG/3ML) 0.083% nebulizer solution  Chronic hypoxemic respiratory failure (HCC) - Plan: albuterol (PROVENTIL) (2.5 MG/3ML) 0.083% nebulizer solution  Discussion:  COPD Worsening dyspnea with exertion, decreased oxygen saturation, and deconditioning. Discussed potential benefit of Stiolto inhaler. - Prescribe albuterol for nebulizer use as needed. - Consider Stiolto inhaler if symptoms worsen.  Lung cancer with pneumonitis Squamous cell lung cancer treated with CCRT and durvalumab. Residual tumor concern in  left lung. Discussed potential chemotherapy for left lung if needed. - Continue follow-up with oncologist and radiation oncologist. - Schedule CT scan for May to assess lung nodules.  Obstructive sleep apnea Uses CPAP effectively. Decline in nocturnal oxygen levels possibly related to lung function changes.  Chronic lymphocytic leukemia (CLL) Diagnosed in 2016, no active treatment required, monitored as part of health management.  Gallbladder dysfunction with cholecystostomy tube High surgical risk, cholecystostomy tube in place, requires regular replacement. - Continue regular replacement of cholecystostomy tube every 12 weeks.  Follow up in 6 months  [name] Comas, MD [facility] Office: [phone]   Current Outpatient Medications:    amLODipine (NORVASC) 5 MG tablet, TAKE 1 TABLET BY MOUTH EVERY DAY, Disp: 90 tablet, Rfl: 1   apixaban (ELIQUIS) 5 MG TABS tablet, Take 1 tablet (5 mg total) by mouth 2 (two) times daily., Disp: 60 tablet, Rfl: 11   atenolol (TENORMIN) 25 MG tablet, Take 1 tablet (25 mg total) by mouth daily as needed (palpitations)., Disp: 90 tablet, Rfl: 0   Cholecalciferol  25 MCG (1000 UT) tablet, Take 1,000 Units by mouth daily., Disp: , Rfl:    diazepam (VALIUM) 5 MG tablet, Take 1 tablet (5 mg total) by mouth every 12 (twelve) hours as needed for anxiety., Disp: 180 tablet, Rfl: 1   levothyroxine (SYNTHROID) 137 MCG tablet, TAKE 1 TABLET(137 MCG) BY MOUTH DAILY BEFORE BREAKFAST, Disp: 30 tablet, Rfl: 2   lovastatin (MEVACOR) 20 MG tablet, TAKE 1 TABLET BY MOUTH EVERY NIGHT AT BEDTIME, Disp: 90 tablet, Rfl: 2   Multiple Vitamin (MULTIVITAMIN) tablet, Take 1 tablet by mouth daily. Centrum Silver., Disp: , Rfl:    Polyethyl Glycol-Propyl Glycol (SYSTANE ULTRA OP), Place 1 drop into both eyes 2 (two) times daily as needed (dry eyes)., Disp: , Rfl:    Sodium Chloride Flush (NORMAL SALINE FLUSH) 0.9 % SOLN, Flush gallbladder drain with 5 - 10 ml once daily, Disp: 560 mL, Rfl: 3   albuterol (PROVENTIL) (2.5 MG/3ML) 0.083% nebulizer solution, Take 3 mLs (2.5 mg total) by nebulization every 6 (six) hours as needed for wheezing or shortness of breath., Disp: 75 mL, Rfl: 12

## 2023-06-11 ENCOUNTER — Encounter: Payer: Self-pay | Admitting: Pulmonary Disease

## 2023-06-15 NOTE — Progress Notes (Signed)
 Radiation Oncology         [phone] ________________________________  Name: Joe [name] MRN: [id number]  Date: 06/16/2023  DOB: [birth year]  Follow-Up Visit Note  CC: [name], [name], MD  [name], [name], MD  No diagnosis found.  Diagnosis:  The primary encounter diagnosis was Malignant neoplasm of right upper lobe of lung (HCC). 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 stage IIIb non-small cell lung cancer, squamous cell carcinoma. Diagnosed in 2021 with recurrence in December 2014.  stage 0 CLL  diagnosed in 2016.   Interval Since Last Radiation:  1 month   First Treatment Date: 2023-05-10 Last Treatment Date: 2023-05-17   Plan Name: Lung_R_SBRT Site: Lung, Right Technique: SBRT/SRT-IMRT Mode: Photon Dose Per Fraction: 18 Gy Prescribed Dose (Delivered / Prescribed): 54 Gy / 54 Gy Prescribed Fxs (Delivered / Prescribed): 3 / 3   Narrative:  The patient returns today for routine follow-up. Last seen in office on 04-20-23 for his re-consultation. Since then, patient completed his radiation therapy which he tolerated quite well but did endorse developing mild fatigue but otherwise developed no other symptoms.   Since then, he presented for a follow up with Dr. Posey Rea on 05-20-23 with complains of worsening dyspnea. Prednisone taper was offered at that time, but patient declined. He then followed up with Dr. Francine Graven on 06-08-23 during which  he continued to complain of worsening breathing difficulties worsened by physical activity. He was prescribed albuterol for nebulizer to use as needed and scheduled for a CT chest on 07-25-23.   No other significant oncologic interval history since the patient was last seen.                          Allergies:  has no known allergies.  Meds: Current Outpatient Medications  Medication Sig Dispense Refill   albuterol (PROVENTIL) (2.5 MG/3ML) 0.083% nebulizer solution  Take 3 mLs (2.5 mg total) by nebulization every 6 (six) hours as needed for wheezing or shortness of breath. 75 mL 12   amLODipine (NORVASC) 5 MG tablet TAKE 1 TABLET BY MOUTH EVERY DAY 90 tablet 1   apixaban (ELIQUIS) 5 MG TABS tablet Take 1 tablet (5 mg total) by mouth 2 (two) times daily. 60 tablet 11   atenolol (TENORMIN) 25 MG tablet Take 1 tablet (25 mg total) by mouth daily as needed (palpitations). 90 tablet 0   Cholecalciferol 25 MCG (1000 UT) tablet Take 1,000 Units by mouth daily.     diazepam (VALIUM) 5 MG tablet Take 1 tablet (5 mg total) by mouth every 12 (twelve) hours as needed for anxiety. 180 tablet 1   levothyroxine (SYNTHROID) 137 MCG tablet TAKE 1 TABLET(137 MCG) BY MOUTH DAILY BEFORE BREAKFAST 30 tablet 2   lovastatin (MEVACOR) 20 MG tablet TAKE 1 TABLET BY MOUTH EVERY NIGHT AT BEDTIME 90 tablet 2   Multiple Vitamin (MULTIVITAMIN) tablet Take 1 tablet by mouth daily. Centrum Silver.     Polyethyl Glycol-Propyl Glycol (SYSTANE ULTRA OP) Place 1 drop into both eyes 2 (two) times daily as needed (dry eyes).     Sodium Chloride Flush (NORMAL SALINE FLUSH) 0.9 % SOLN Flush gallbladder drain with 5 - 10 ml once daily 560 mL 3   No current facility-administered medications for this encounter.    Physical Findings: The patient is in no acute distress. Patient is alert and oriented.  vitals were not taken  for this visit. .  No significant changes. Lungs are clear to auscultation bilaterally. Heart has regular rate and rhythm. No palpable cervical, supraclavicular, or axillary adenopathy. Abdomen soft, non-tender, normal bowel sounds.   Lab Findings: Lab Results  Component Value Date   WBC 12.9 (H) 04/07/2023   HGB 14.7 04/07/2023   HCT 45.5 04/07/2023   MCV 89.2 04/07/2023   PLT 304 04/07/2023    Radiographic Findings: IR EXCHANGE BILIARY DRAIN Result Date: 05/24/2023 INDICATION: 78 year old male with history of calculus cholecystitis status post percutaneous  cholecystostomy tube placement on 01/21/2022 and attempted percutaneous gallstone retrieval on 05/04/2022 which was unsuccessful. The patient has elected for chronic indwelling cholecystostomy tube and presents for routine exchange. EXAM: FLUOROSCOPIC GUIDED CHOLECYSTOSTOMY TUBE EXCHANGE MEDICATIONS: None. ANESTHESIA/SEDATION: None. FLUOROSCOPY TIME:  Twenty-five mGy COMPLICATIONS: None immediate. PROCEDURE: Informed written consent was obtained from the patient after a thorough discussion of the procedural risks, benefits and alternatives. All questions were addressed. Maximal Sterile Barrier Technique was utilized including caps, mask, sterile gowns, sterile gloves, sterile drape, hand hygiene and skin antiseptic. A timeout was performed prior to the initiation of the procedure. A preprocedural spot fluoroscopic image was obtained of the right upper abdominal quadrant existing cholecystostomy tube. The skin surrounding the cholecystostomy tube was anesthetized with 1% lidocaine with epinephrine. The external portion of the cholecystostomy tube was cut and cannulated with a short Amplatz wire which was advanced through the tube and coiled within the gallbladder lumen Next, under intermittent fluoroscopic guidance, the existing 8 [place] cholecystostomy tube was exchanged for a new, 8 [place] miniloop cholecystostomy tube which was repositioned into the more central aspect of the gallbladder lumen. Contrast injection confirms appropriate positioning and functionality of the cholecystomy tube. The cholecystostomy tube was flushed with a small amount of saline and reconnected to a gravity bag. The cholecystostomy tube was secured with an interrupted suture and a Stat Lock device. A dressing was applied. The patient tolerated the procedure well without immediate postprocedural complication. IMPRESSION: Technically successful fluoroscopic guided exchange of 8 French percutaneous nephrostomy tube, converted to a mini loop  due to continued contraction of the gallbladder. Marliss Coots, MD [facility] Electronically Signed   By: Marliss Coots M.D.   On: 05/24/2023 17:01    Impression: Stage III (cT3, cN2, cM0) squamous cell carcinoma of left lung   The patient is recovering from the effects of radiation.  ***  Plan:  ***   *** minutes of total time was spent for this patient encounter, including preparation, face-to-face counseling with the patient and coordination of care, physical exam, and documentation of the encounter. ____________________________________  Billie Lade, PhD, MD  This document serves as a record of services personally performed by Antony Blackbird, MD. It was created on his behalf by Herbie Saxon, a trained medical scribe. The creation of this record is based on the scribe's personal observations and the provider's statements to them. This document has been checked and approved by the attending provider.

## 2023-06-16 ENCOUNTER — Ambulatory Visit
Admission: RE | Admit: 2023-06-16 | Discharge: 2023-06-16 | Disposition: A | Discharge status: Home / Self Care | Payer: Medicare Other | Source: Ambulatory Visit | Attending: Radiation Oncology | Admitting: Radiation Oncology

## 2023-06-16 ENCOUNTER — Encounter: Payer: Self-pay | Admitting: Radiation Oncology

## 2023-06-16 VITALS — BP 144/74 | HR 94 | Temp 97.8°F | Resp 20 | Ht 71.0 in | Wt 189.0 lb

## 2023-06-16 DIAGNOSIS — C3411 Malignant neoplasm of upper lobe, right bronchus or lung: Secondary | ICD-10-CM | POA: Insufficient documentation

## 2023-06-16 DIAGNOSIS — Z79899 Other long term (current) drug therapy: Secondary | ICD-10-CM | POA: Insufficient documentation

## 2023-06-16 DIAGNOSIS — Z79890 Hormone replacement therapy (postmenopausal): Secondary | ICD-10-CM | POA: Insufficient documentation

## 2023-06-16 DIAGNOSIS — C3412 Malignant neoplasm of upper lobe, left bronchus or lung: Secondary | ICD-10-CM | POA: Diagnosis not present

## 2023-06-16 DIAGNOSIS — Z7901 Long term (current) use of anticoagulants: Secondary | ICD-10-CM | POA: Insufficient documentation

## 2023-06-16 DIAGNOSIS — Z923 Personal history of irradiation: Secondary | ICD-10-CM | POA: Insufficient documentation

## 2023-06-16 NOTE — Progress Notes (Signed)
 Joe [name] is here today for follow up post radiation to the lung.  Lung Side: Right, patient completed treatment on 05/17/23.  Does the patient complain of any of the following: Pain: No Shortness of breath w/wo exertion: Yes,  continues to wear oxygen at 2 liters.  Cough: Yes Hemoptysis: No Pain with swallowing: No Swallowing/choking concerns: No Appetite: Improved  Energy Level: Low Post radiation skin Changes: No    Additional comments if applicable BP (!) 144/74   Pulse 94   Temp 97.8 F (36.6 C)   Resp 20   Ht 5\' 11"  (1.803 m)   Wt 189 lb (85.7 kg)   SpO2 96%   PF (!) 2 L/min   BMI 26.36 kg/m  :

## 2023-06-20 ENCOUNTER — Encounter (INDEPENDENT_AMBULATORY_CARE_PROVIDER_SITE_OTHER): Payer: Medicare Other | Admitting: Ophthalmology

## 2023-06-29 ENCOUNTER — Other Ambulatory Visit: Payer: Self-pay | Admitting: Physician Assistant

## 2023-06-29 DIAGNOSIS — E039 Hypothyroidism, unspecified: Secondary | ICD-10-CM

## 2023-07-25 ENCOUNTER — Inpatient Hospital Stay: Payer: Medicare Other | Attending: Internal Medicine

## 2023-07-25 ENCOUNTER — Other Ambulatory Visit (HOSPITAL_COMMUNITY): Payer: Self-pay

## 2023-07-25 ENCOUNTER — Ambulatory Visit (HOSPITAL_COMMUNITY)
Admission: RE | Admit: 2023-07-25 | Discharge: 2023-07-25 | Disposition: A | Discharge status: Home / Self Care | Payer: Medicare Other | Source: Ambulatory Visit | Attending: Physician Assistant | Admitting: Physician Assistant

## 2023-07-25 DIAGNOSIS — Z923 Personal history of irradiation: Secondary | ICD-10-CM | POA: Diagnosis not present

## 2023-07-25 DIAGNOSIS — J449 Chronic obstructive pulmonary disease, unspecified: Secondary | ICD-10-CM | POA: Insufficient documentation

## 2023-07-25 DIAGNOSIS — Z9221 Personal history of antineoplastic chemotherapy: Secondary | ICD-10-CM | POA: Insufficient documentation

## 2023-07-25 DIAGNOSIS — C3412 Malignant neoplasm of upper lobe, left bronchus or lung: Secondary | ICD-10-CM | POA: Diagnosis not present

## 2023-07-25 DIAGNOSIS — E039 Hypothyroidism, unspecified: Secondary | ICD-10-CM

## 2023-07-25 DIAGNOSIS — Z79633 Long term (current) use of mitotic inhibitor: Secondary | ICD-10-CM | POA: Insufficient documentation

## 2023-07-25 DIAGNOSIS — G4733 Obstructive sleep apnea (adult) (pediatric): Secondary | ICD-10-CM | POA: Insufficient documentation

## 2023-07-25 DIAGNOSIS — R918 Other nonspecific abnormal finding of lung field: Secondary | ICD-10-CM | POA: Diagnosis not present

## 2023-07-25 DIAGNOSIS — C3492 Malignant neoplasm of unspecified part of left bronchus or lung: Secondary | ICD-10-CM

## 2023-07-25 DIAGNOSIS — Z79899 Other long term (current) drug therapy: Secondary | ICD-10-CM | POA: Insufficient documentation

## 2023-07-25 DIAGNOSIS — Z79630 Long term (current) use of alkylating agent: Secondary | ICD-10-CM | POA: Insufficient documentation

## 2023-07-25 DIAGNOSIS — J432 Centrilobular emphysema: Secondary | ICD-10-CM | POA: Diagnosis not present

## 2023-07-25 DIAGNOSIS — C9110 Chronic lymphocytic leukemia of B-cell type not having achieved remission: Secondary | ICD-10-CM | POA: Insufficient documentation

## 2023-07-25 LAB — CBC WITH DIFFERENTIAL (CANCER CENTER ONLY)
Abs Immature Granulocytes: 0.04 10*3/uL (ref 0.00–0.07)
Basophils Absolute: 0.1 10*3/uL (ref 0.0–0.1)
Basophils Relative: 1 %
Eosinophils Absolute: 0.2 10*3/uL (ref 0.0–0.5)
Eosinophils Relative: 2 %
HCT: 46.6 % (ref 39.0–52.0)
Hemoglobin: 15 g/dL (ref 13.0–17.0)
Immature Granulocytes: 0 %
Lymphocytes Relative: 17 %
Lymphs Abs: 2.2 10*3/uL (ref 0.7–4.0)
MCH: 28.5 pg (ref 26.0–34.0)
MCHC: 32.2 g/dL (ref 30.0–36.0)
MCV: 88.6 fL (ref 80.0–100.0)
Monocytes Absolute: 1 10*3/uL (ref 0.1–1.0)
Monocytes Relative: 8 %
Neutro Abs: 9.3 10*3/uL — ABNORMAL HIGH (ref 1.7–7.7)
Neutrophils Relative %: 72 %
Platelet Count: 318 10*3/uL (ref 150–400)
RBC: 5.26 MIL/uL (ref 4.22–5.81)
RDW: 13.4 % (ref 11.5–15.5)
WBC Count: 12.7 10*3/uL — ABNORMAL HIGH (ref 4.0–10.5)
nRBC: 0 % (ref 0.0–0.2)

## 2023-07-25 LAB — CMP (CANCER CENTER ONLY)
ALT: 13 U/L (ref 0–44)
AST: 18 U/L (ref 15–41)
Albumin: 4.1 g/dL (ref 3.5–5.0)
Alkaline Phosphatase: 86 U/L (ref 38–126)
Anion gap: 4 — ABNORMAL LOW (ref 5–15)
BUN: 8 mg/dL (ref 8–23)
CO2: 32 mmol/L (ref 22–32)
Calcium: 9.8 mg/dL (ref 8.9–10.3)
Chloride: 103 mmol/L (ref 98–111)
Creatinine: 0.88 mg/dL (ref 0.61–1.24)
GFR, Estimated: >60 mL/min (ref >60)
Glucose, Bld: 103 mg/dL — ABNORMAL HIGH (ref 70–99)
Potassium: 4.2 mmol/L (ref 3.5–5.1)
Sodium: 139 mmol/L (ref 135–145)
Total Bilirubin: 0.9 mg/dL (ref 0.0–1.2)
Total Protein: 7.8 g/dL (ref 6.5–8.1)

## 2023-07-25 LAB — TSH: TSH: 1.42 u[IU]/mL (ref 0.350–4.500)

## 2023-07-25 MED ORDER — IOHEXOL 300 MG/ML  SOLN
75.0000 mL | Freq: Once | INTRAMUSCULAR | Status: AC | PRN
  Administered 2023-07-25: 75 mL via INTRAVENOUS

## 2023-07-25 MED ORDER — SODIUM CHLORIDE (PF) 0.9 % IJ SOLN
INTRAMUSCULAR | Status: AC
  Filled 2023-07-25: qty 50

## 2023-07-28 ENCOUNTER — Ambulatory Visit (HOSPITAL_COMMUNITY): Payer: Medicare Other | Attending: Cardiology

## 2023-07-28 ENCOUNTER — Encounter: Payer: Self-pay | Admitting: Cardiology

## 2023-07-28 DIAGNOSIS — I351 Nonrheumatic aortic (valve) insufficiency: Secondary | ICD-10-CM | POA: Diagnosis not present

## 2023-07-28 DIAGNOSIS — I77810 Thoracic aortic ectasia: Secondary | ICD-10-CM | POA: Insufficient documentation

## 2023-07-28 LAB — ECHOCARDIOGRAM COMPLETE
AR max vel: 1.92 cm2
AV Area VTI: 1.88 cm2
AV Area mean vel: 1.7 cm2
AV Mean grad: 11.8 mmHg
AV Peak grad: 20.9 mmHg
Ao pk vel: 2.29 m/s
Area-P 1/2: 2.87 cm2
P 1/2 time: 621 ms
S' Lateral: 2.6 cm

## 2023-08-01 ENCOUNTER — Inpatient Hospital Stay (HOSPITAL_BASED_OUTPATIENT_CLINIC_OR_DEPARTMENT_OTHER): Payer: Medicare Other | Admitting: Internal Medicine

## 2023-08-01 VITALS — BP 131/79 | HR 100 | Temp 97.5°F | Resp 17 | Ht 71.0 in | Wt 186.1 lb

## 2023-08-01 DIAGNOSIS — Z79633 Long term (current) use of mitotic inhibitor: Secondary | ICD-10-CM | POA: Diagnosis not present

## 2023-08-01 DIAGNOSIS — C3412 Malignant neoplasm of upper lobe, left bronchus or lung: Secondary | ICD-10-CM | POA: Diagnosis not present

## 2023-08-01 DIAGNOSIS — J449 Chronic obstructive pulmonary disease, unspecified: Secondary | ICD-10-CM | POA: Diagnosis not present

## 2023-08-01 DIAGNOSIS — G4733 Obstructive sleep apnea (adult) (pediatric): Secondary | ICD-10-CM | POA: Diagnosis not present

## 2023-08-01 DIAGNOSIS — C3490 Malignant neoplasm of unspecified part of unspecified bronchus or lung: Secondary | ICD-10-CM

## 2023-08-01 DIAGNOSIS — C9110 Chronic lymphocytic leukemia of B-cell type not having achieved remission: Secondary | ICD-10-CM | POA: Diagnosis not present

## 2023-08-01 DIAGNOSIS — Z79630 Long term (current) use of alkylating agent: Secondary | ICD-10-CM | POA: Diagnosis not present

## 2023-08-01 NOTE — Progress Notes (Addendum)
 [facility] [phone]   Fax:([phone]  OFFICE PROGRESS NOTE  [name], Oakley Bellman, MD [address] [place] [address]  DIAGNOSIS: 1) Stage IIIb non-small cell lung cancer, squamous cell carcinoma.  The patient presented with a left upper lobe lung mass as well as associated mediastinal lymphadenopathy and contralateral right hilar mild hypermetabolism.  There was heterogeneous marrow uptake in the spine but with more focal areas at L4 without imaging correlation on CT scan.  The patient was diagnosed in August 2021. 2) CLL stage 0, diagnosed in 2016   PD-L1 expression: 2%   PRIOR THERAPY:  1) Concurrent chemoradiation with carboplatin  for an AUC of 2 and paclitaxel  45 mg per metered squared weekly.  First dose expected on 12/13/2019. Status post 5 cycles.   Last dose was giving January 14, 2020. 2) Consolidation treatment with immunotherapy with Imfinzi  1500 mg IV every 4 weeks.  First dose February 19, 2020.  Status post 13 cycles.   CURRENT THERAPY: Observation.  INTERVAL HISTORY: Joe [name] 78 y.o. male returns to the clinic today for follow-up visit.Discussed the use of AI scribe software for clinical note transcription with the patient, who gave verbal consent to proceed.  History of Present Illness   Joe [name] is a 78 year old male with stage three B non-small cell lung cancer who presents for evaluation and repeat CT scan of the chest for restaging of his disease.  He was diagnosed with stage three B non-small cell lung cancer, squamous cell carcinoma, in August 2021. He completed concurrent chemoradiation followed by one year of consolidation immunotherapy with durvalumab , which concluded in October 2022. Since then, he has been under observation.  He has a history of stage zero chronic lymphocytic leukemia (CLL) that has been under observation since 2016. No active treatment has been required for this condition.  He  experiences baseline breathing issues and gets winded more easily, especially during exertion or when bending over. He uses two liters of oxygen  and notes that his oxygen  saturation can drop to 92-93% upon waking. He has been diagnosed with sleep apnea and COPD, which contribute to his respiratory symptoms.  He mentions that his pulmonologist left last year, and he has since met with a new pulmonologist, Dr. Diania Fortes. He describes experiencing pressure in his chest, particularly when bending over, which has been attributed to abdominal pressure on the chest.       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 mm by echo 07/2023   Cataract    Chronic lymphocytic leukemia (HCC) 07/18/2014   chronic stage 1- no symtoms   CLL (chronic lymphocytic leukemia) (HCC) 08/08/2014   2016 Dr Maryalice Smaller Stage 0   COPD mixed type (HCC) 07/26/2007   Smoker - stopped 6/14    De Quervain's tenosynovitis, right 12/13/2013   2015    Depression    at times   Dysrhythmia    PAF   Dysuria 12/13/2013   9/15 - poss stricture Urol ref was offered    Gallstones 11/16/2017   Asymptomatic Pt refused surg ref   Generalized anxiety disorder 09/07/2012   Chronic   Potential  benefits of a long term steroid  use as well as potential risks  and complications were explained to the patient and were aknowledged.      GERD 12/02/2006   Chronic      Grade I diastolic dysfunction 01/20/2022   Grief 05-24-2016   [name] died in Aug 17, 2014   Gynecomastia 23-Dec-2013   Benign B 2015    Hyperlipidemia    Hypertension    Hypothyroidism 12/25/2014   08/17/14 On Levothyroxine     Intertrigo 02/02/2012   11/13    Neoplasm of uncertain behavior of skin 03/12/2013   12/14 R ear, chest    OSA on CPAP     mild with AHI 9/hr and oxygen  desats as low as 75%   Paresis (HCC)    right- s/p cerv decompression   PERIORBITAL CELLULITIS 02/22/2009   Qualifier: Diagnosis of  By: Cherlyn Cornet MD, Amy     Retinal detachment    L>>R    ALLERGIES:  has no known allergies.  MEDICATIONS:  Current Outpatient Medications  Medication Sig Dispense Refill   albuterol  (PROVENTIL ) (2.5 MG/3ML) 0.083% nebulizer solution Take 3 mLs (2.5 mg total) by nebulization every 6 (six) hours as needed for wheezing or shortness of breath. 75 mL 12   amLODipine  (NORVASC ) 5 MG tablet TAKE 1 TABLET BY MOUTH EVERY DAY 90 tablet 1   apixaban  (ELIQUIS ) 5 MG TABS tablet Take 1 tablet (5 mg total) by mouth 2 (two) times daily. 60 tablet 11   atenolol  (TENORMIN ) 25 MG tablet Take 1 tablet (25 mg total) by mouth daily as needed (palpitations). 90 tablet 0   Cholecalciferol  25 MCG (1000 UT) tablet Take 1,000 Units by mouth daily.     diazepam  (VALIUM ) 5 MG tablet Take 1 tablet (5 mg total) by mouth every 12 (twelve) hours as needed for anxiety. 180 tablet 1   levothyroxine  (SYNTHROID ) 137 MCG tablet TAKE 1 TABLET(137 MCG) BY MOUTH DAILY BEFORE BREAKFAST 30 tablet 2   lovastatin  (MEVACOR ) 20 MG tablet TAKE 1 TABLET BY MOUTH EVERY NIGHT AT BEDTIME 90 tablet 2   Multiple Vitamin (MULTIVITAMIN) tablet Take 1 tablet by mouth daily. Centrum Silver.     Polyethyl Glycol-Propyl Glycol (SYSTANE ULTRA OP) Place 1 drop into both eyes 2 (two) times daily as needed (dry eyes).     Sodium Chloride  Flush (NORMAL SALINE FLUSH) 0.9 % SOLN Flush gallbladder drain with 5 - 10 ml once daily 560 mL 3   No current facility-administered medications for this visit.    SURGICAL HISTORY:  Past Surgical History:  Procedure Laterality Date   BICEPS TENDON REPAIR     BRONCHIAL BIOPSY  10/23/2019   Procedure: BRONCHIAL BIOPSIES;  Surgeon: Denson Flake, MD;  Location: [facility] ENDOSCOPY;  Service: Pulmonary;;   BRONCHIAL BIOPSY  11/20/2019   Procedure: BRONCHIAL  BIOPSIES;  Surgeon: Denson Flake, MD;  Location: [facility] ENDOSCOPY;  Service: Pulmonary;;   BRONCHIAL BRUSHINGS  10/23/2019   Procedure: BRONCHIAL BRUSHINGS;  Surgeon: Denson Flake, MD;  Location: [facility] ENDOSCOPY;  Service: Pulmonary;;   BRONCHIAL BRUSHINGS  11/20/2019   Procedure: BRONCHIAL BRUSHINGS;  Surgeon: Denson Flake, MD;  Location: [facility] ENDOSCOPY;  Service: Pulmonary;;   BRONCHIAL NEEDLE ASPIRATION BIOPSY  10/23/2019   Procedure: BRONCHIAL NEEDLE ASPIRATION BIOPSIES;  Surgeon: Denson Flake, MD;  Location: [facility] ENDOSCOPY;  Service: Pulmonary;;   BRONCHIAL NEEDLE ASPIRATION BIOPSY  11/20/2019   Procedure: BRONCHIAL NEEDLE ASPIRATION BIOPSIES;  Surgeon: Denson Flake, MD;  Location: [facility] ENDOSCOPY;  Service: Pulmonary;;   BRONCHIAL WASHINGS  11/20/2019   Procedure: BRONCHIAL WASHINGS;  Surgeon: Denson Flake, MD;  Location: [facility] ENDOSCOPY;  Service: Pulmonary;;   CATARACT EXTRACTION Left    COLONOSCOPY  04-14-99   Dr Joan Mouton polyp-TA in epic   IR BALLOON DILATION OF BILIARY DUCTS/AMPULLA  05/04/2022   IR CONVERT BILIARY DRAIN TO INT EXT BILIARY DRAIN  05/04/2022   IR CONVERT BILIARY DRAIN TO INT EXT BILIARY DRAIN  05/27/2022   IR EXCHANGE BILIARY DRAIN  02/02/2022   IR EXCHANGE BILIARY DRAIN  03/19/2022   IR EXCHANGE BILIARY DRAIN  07/13/2022   IR EXCHANGE BILIARY DRAIN  09/07/2022   IR EXCHANGE BILIARY DRAIN  11/30/2022   IR EXCHANGE BILIARY DRAIN  02/22/2023   IR EXCHANGE BILIARY DRAIN  05/24/2023   IR PERC CHOLECYSTOSTOMY  01/21/2022   IR RADIOLOGIST EVAL & MGMT  04/08/2022   IR RADIOLOGIST EVAL & MGMT  05/17/2022   IR REMOVAL OF CALCULI/DEBRIS BILIARY DUCT/GB  05/04/2022   POLYPECTOMY  04-14-99   POSTERIOR LAMINECTOMY / DECOMPRESSION CERVICAL SPINE     Dr Lamon Pillow   RETINAL DETACHMENT SURGERY     left eye, 2007 x2, 2008 x 3   ROTATOR CUFF REPAIR  2004   right   TONSILLECTOMY  [id number]   VIDEO BRONCHOSCOPY WITH ENDOBRONCHIAL NAVIGATION N/A 10/23/2019   Procedure: VIDEO BRONCHOSCOPY WITH  ENDOBRONCHIAL NAVIGATION;  Surgeon: Denson Flake, MD;  Location: [facility] ENDOSCOPY;  Service: Pulmonary;  Laterality: N/A;   VIDEO BRONCHOSCOPY WITH ENDOBRONCHIAL NAVIGATION N/A 11/20/2019   Procedure: VIDEO BRONCHOSCOPY WITH ENDOBRONCHIAL NAVIGATION;  Surgeon: Denson Flake, MD;  Location: [facility] ENDOSCOPY;  Service: Pulmonary;  Laterality: N/A;   VIDEO BRONCHOSCOPY WITH ENDOBRONCHIAL ULTRASOUND N/A 10/23/2019   Procedure: VIDEO BRONCHOSCOPY WITH ENDOBRONCHIAL ULTRASOUND;  Surgeon: Denson Flake, MD;  Location: [facility] ENDOSCOPY;  Service: Pulmonary;  Laterality: N/A;    REVIEW OF SYSTEMS:  Constitutional: positive for fatigue Eyes: negative Ears, nose, mouth, throat, and face: negative Respiratory: positive for dyspnea on exertion Cardiovascular: negative Gastrointestinal: negative Genitourinary:negative Integument/breast: negative Hematologic/lymphatic: negative Musculoskeletal:negative Neurological: negative Behavioral/Psych: negative Endocrine: negative Allergic/Immunologic: negative   PHYSICAL EXAMINATION: General appearance: alert, cooperative, and no distress Head: Normocephalic, without obvious abnormality, atraumatic Neck: no adenopathy, no JVD, supple, symmetrical, trachea midline, and thyroid  not enlarged, symmetric, no tenderness/mass/nodules Lymph nodes: Cervical, supraclavicular, and axillary nodes normal. Resp: clear to auscultation bilaterally Back: symmetric, no curvature. ROM normal. No CVA tenderness. Cardio: regular rate and rhythm, S1, S2 normal, no murmur, click, rub or gallop GI: soft, non-tender; bowel sounds normal; no masses,  no organomegaly Extremities: extremities normal, atraumatic, no cyanosis or edema Neurologic: Alert and oriented X 3, normal strength and tone. Normal symmetric reflexes. Normal coordination and gait  ECOG PERFORMANCE STATUS: 1 - Symptomatic but completely ambulatory  Blood pressure 131/79, pulse 100, temperature (!) 97.5 F (36.4 C),  temperature source Temporal, resp. rate 17, height 5\' 11"  (1.803 m), weight 186 lb 1.6 oz (84.4 kg), SpO2 98%.  LABORATORY DATA: Lab Results  Component Value Date   WBC 12.7 (H) 07/25/2023   HGB 15.0 07/25/2023   HCT 46.6 07/25/2023   MCV 88.6 07/25/2023   PLT 318 07/25/2023      Chemistry      Component Value Date/Time   NA 139 07/25/2023 1047   NA 143 03/10/2017 0911   K 4.2 07/25/2023 1047   K 4.7 03/10/2017 0911   CL 103 07/25/2023 1047   CO2 32 07/25/2023 1047  CO2 30 (H) 03/10/2017 0911   BUN 8 07/25/2023 1047   BUN 10.7 03/10/2017 0911   CREATININE 0.88 07/25/2023 1047   CREATININE 0.8 03/10/2017 0911      Component Value Date/Time   CALCIUM 9.8 07/25/2023 1047   CALCIUM 9.4 03/10/2017 0911   ALKPHOS 86 07/25/2023 1047   ALKPHOS 76 03/10/2017 0911   AST 18 07/25/2023 1047   AST 34 03/10/2017 0911   ALT 13 07/25/2023 1047   ALT 45 03/10/2017 0911   BILITOT 0.9 07/25/2023 1047   BILITOT 1.41 (H) 03/10/2017 0911       RADIOGRAPHIC STUDIES: CT Chest W Contrast Result Date: 07/31/2023 CLINICAL DATA:  Metastatic squamous cell carcinoma of the left upper lobe with local recurrence and subpleural right upper lobe nodule status post SBRT 05/17/2023. Restaging. * Tracking Code: BO * EXAM: CT CHEST WITH CONTRAST TECHNIQUE: Multidetector CT imaging of the chest was performed during intravenous contrast administration. RADIATION DOSE REDUCTION: This exam was performed according to the departmental dose-optimization program which includes automated exposure control, adjustment of the mA and/or kV according to patient size and/or use of iterative reconstruction technique. CONTRAST:  75mL OMNIPAQUE  IOHEXOL  300 MG/ML  SOLN COMPARISON:  Nuclear medicine PET dated 03/07/2023, CT chest dated 02/23/2023 and multiple priors FINDINGS: Cardiovascular: Normal heart size. No significant pericardial fluid/thickening. Ascending thoracic aorta measures 4.1 x 4.1 cm. No central pulmonary  emboli. Coronary artery calcifications and aortic atherosclerosis. Mediastinum/Nodes: Normal esophagus. No pathologically enlarged axillary, supraclavicular, mediastinal, or hilar lymph nodes. Lungs/Pleura: The central airways are patent. Upper lobe predominant moderate centrilobular and paraseptal emphysema. Post treatment changes of the left upper lobe with persistent mass-like consolidation and volume. Ill-defined hypoattenuating area measuring 2.8 x 2.8 cm (2:44) may correspond to area of hypermetabolism on prior PET, previously measuring approximately 3.7 x 2.8 cm. Subpleural right upper lobe nodule measures 1.2 x 1.2 cm (7:39), previously 1.7 x 0.9 cm with increased density of adjacent linear consolidation/scarring. Unchanged 6 mm right upper lobe nodule (7:67). Increased conspicuity right basilar subpleural reticulation. No definite new pulmonary nodules. No pneumothorax. No pleural effusion. Upper abdomen: Right anterolateral approach percutaneous cholecystostomy tube in-situ. Gallbladder is decompressed. Cholelithiasis. Punctate nonobstructing right renal stone. Musculoskeletal: No acute or abnormal lytic or blastic osseous lesions. Multilevel degenerative changes of the thoracic spine. Unchanged compression of L1. IMPRESSION: 1. Post treatment changes of the left upper lobe with persistent mass-like consolidation and volume. Decreased size of ill-defined hypoattenuating area may correspond to area of hypermetabolic recurrent disease on prior PET. 2. Slightly decreased size of subpleural right upper lobe nodule with increased density of adjacent linear consolidation/scarring, which may reflect post-treatment change. 3. No definite new pulmonary nodules or lymphadenopathy. 4. Ascending thoracic aorta measures 4.1 cm. Recommend annual imaging followup by CTA or MRA. This recommendation follows 2010 ACCF/AHA/AATS/ACR/ASA/SCA/SCAI/SIR/STS/SVM Guidelines for the Diagnosis and Management of Patients with Thoracic  Aortic Disease. Circulation. 2010; 121: W295-A213. Aortic aneurysm NOS (ICD10-I71.9) Aortic Atherosclerosis (ICD10-I70.0) and Emphysema (ICD10-J43.9). Coronary artery calcifications. Assessment for potential risk factor modification, dietary therapy or pharmacologic therapy may be warranted, if clinically indicated. Electronically Signed   By: Limin  Xu M.D.   On: 07/31/2023 12:58   ECHOCARDIOGRAM COMPLETE Result Date: 07/28/2023    ECHOCARDIOGRAM REPORT   Patient Name:   Joe [name] Date of Exam: 07/28/2023 Medical Rec #:  [id number]        Height:       71.0 in Accession #:    [id number]  Weight:       189.0 lb Date of Birth:  [birth year]       BSA:          2.058 m Patient Age:    77 years         BP:           146/78 mmHg Patient Gender: M                HR:           77 bpm. Exam Location:  [address] Procedure: 2D Echo, Cardiac Doppler and Color Doppler (Both Spectral and Color            Flow Doppler were utilized during procedure). Indications:    I71 Ascending Aorta Aneurysm  History:        Patient has prior history of Echocardiogram examinations, most                 recent 08/03/2022. COPD; Risk Factors:Hypertension and                 Dyslipidemia. Obstructive sleep apnea-CPAP.  Sonographer:    Cheri Coria Rodgers-Jones RDCS Referring Phys: [id number] TRACI R TURNER IMPRESSIONS  1. Left ventricular ejection fraction, by estimation, is 60 to 65%. The left ventricle has normal function. The left ventricle has no regional wall motion abnormalities. There is mild left ventricular hypertrophy. Left ventricular diastolic parameters are consistent with Grade I diastolic dysfunction (impaired relaxation).  2. Right ventricular systolic function is normal. The right ventricular size is normal. Tricuspid regurgitation signal is inadequate for assessing PA pressure.  3. The mitral valve is normal in structure. No evidence of mitral valve regurgitation. No evidence of mitral stenosis.  4. The aortic valve was not  well visualized. Aortic valve regurgitation is trivial. Mild aortic valve stenosis. Vmax 2.4 m/s, MG 12 mmHg, AVA 1.7 cm^2, DI 0.51  5. Aortic dilatation noted. There is dilatation of the ascending aorta, measuring 41 mm.  6. The inferior vena cava is normal in size with greater than 50% respiratory variability, suggesting right atrial pressure of 3 mmHg. FINDINGS  Left Ventricle: Left ventricular ejection fraction, by estimation, is 60 to 65%. The left ventricle has normal function. The left ventricle has no regional wall motion abnormalities. The left ventricular internal cavity size was normal in size. There is  mild left ventricular hypertrophy. Left ventricular diastolic parameters are consistent with Grade I diastolic dysfunction (impaired relaxation). Right Ventricle: The right ventricular size is normal. No increase in right ventricular wall thickness. Right ventricular systolic function is normal. Tricuspid regurgitation signal is inadequate for assessing PA pressure. Left Atrium: Left atrial size was normal in size. Right Atrium: Right atrial size was normal in size. Pericardium: There is no evidence of pericardial effusion. Mitral Valve: The mitral valve is normal in structure. No evidence of mitral valve regurgitation. No evidence of mitral valve stenosis. Tricuspid Valve: The tricuspid valve is normal in structure. Tricuspid valve regurgitation is trivial. The aortic valve was not well visualized. Aortic valve regurgitation is trivial. Mild aortic stenosis is present. Pulmonic Valve: The pulmonic valve was not well visualized. Pulmonic valve regurgitation is not visualized. Aorta: The aortic root is normal in size and structure and aortic dilatation noted. There is dilatation of the ascending aorta, measuring 41 mm. Venous: The inferior vena cava is normal in size with greater than 50% respiratory variability, suggesting right atrial pressure of 3 mmHg. IAS/Shunts: The interatrial septum was not well  visualized.  LEFT VENTRICLE PLAX 2D LVIDd:         4.40 cm   Diastology LVIDs:         2.60 cm   LV e' medial:    6.25 cm/s LV PW:         1.00 cm   LV E/e' medial:  9.7 LV IVS:        1.20 cm   LV e' lateral:   7.89 cm/s LVOT diam:     2.20 cm   LV E/e' lateral: 7.7 LV SV:         78 LV SV Index:   38 LVOT Area:     3.80 cm  RIGHT VENTRICLE             IVC RV Basal diam:  3.60 cm     IVC diam: 1.30 cm RV S prime:     14.60 cm/s TAPSE (M-mode): 2.4 cm LEFT ATRIUM             Index        RIGHT ATRIUM           Index LA diam:        3.40 cm 1.65 cm/m   RA Area:     15.00 cm LA Vol (A2C):   21.6 ml 10.49 ml/m  RA Volume:   40.50 ml  19.67 ml/m LA Vol (A4C):   22.4 ml 10.88 ml/m LA Biplane Vol: 23.1 ml 11.22 ml/m  AORTIC VALVE AV Area (Vmax):    1.92 cm AV Area (Vmean):   1.70 cm AV Area (VTI):     1.88 cm AV Vmax:           228.60 cm/s AV Vmean:          159.200 cm/s AV VTI:            0.415 m AV Peak Grad:      20.9 mmHg AV Mean Grad:      11.8 mmHg LVOT Vmax:         115.50 cm/s LVOT Vmean:        71.250 cm/s LVOT VTI:          0.205 m LVOT/AV VTI ratio: 0.49 AI PHT:            621 msec  AORTA Ao Root diam: 3.60 cm Ao Asc diam:  4.10 cm MITRAL VALVE MV Area (PHT): 2.87 cm    SHUNTS MV Decel Time: 264 msec    Systemic VTI:  0.20 m MV E velocity: 60.85 cm/s  Systemic Diam: 2.20 cm MV A velocity: 89.00 cm/s MV E/A ratio:  0.68 Carson Clara MD Electronically signed by Carson Clara MD Signature Date/Time: 07/28/2023/4:42:10 PM    Final      ASSESSMENT AND PLAN: This is a very pleasant 79 years old white male with a stage IIIb non-small cell lung cancer, squamous cell carcinoma diagnosed in August of 2021.  The patient also has a history of stage 0 CLL.  He underwent a course of concurrent chemoradiation with weekly carboplatin  for AUC of 2 and paclitaxel  45 mg/M2 status post 5 cycles of the treatment.  He tolerated this treatment well with no concerning adverse effect except for mild  odynophagia and dysphagia as well as thrombocytopenia. His scan showed improvement of his disease with decrease in the size of the left upper lobe lung mass as well as decrease in the mediastinal lymphadenopathy and no evidence of metastatic disease. The  patient completed consolidation treatment with immunotherapy with Imfinzi  1500 mg IV every 4 weeks status post 13 cycles. Previous CT scan of the chest showed the left upper lobe postradiation changes with associated areas of increased soft tissue concerning for evolving postradiation changes or recurrent disease and PET scan was recommended for further evaluation.  He also had slightly increased size of subcarinal lymph node.  The patient had a PET scan performed on January 15, 2022 and that showed a stable appearance of the masslike architecture and fibrosis with volume loss involving the left upper lobe and left upper hilar region with low-level tracer uptake with no signs of tracer avid tumor recurrence or metastatic disease.  The patient has been on observation and he is feeling fine. He had repeat CT scan of the chest performed recently.  I personally independently reviewed the scan and discussed the result with the patient today.  His scan showed no concerning findings for disease progression.    Non-small cell lung cancer, stage 3B Stage 3B non-small cell lung cancer, squamous cell carcinoma, diagnosed in August 2021. Status post concurrent chemoradiation and one year of consolidation immunotherapy with durvalumab , completed in October 2022. Currently under observation with stable disease on recent CT scan, showing no growth since last evaluation. He declined bronchoscopy due to ineligibility and preference. - Continue observation with repeat CT scan in four months before extending to six-month intervals.  Chronic obstructive pulmonary disease (COPD) COPD with increased exertional dyspnea, especially when bending over. Currently using 2 liters of  oxygen . Pulmonologist noted abdominal pressure affecting breathing.  Sleep apnea Sleep apnea contributing to morning oxygen  saturation levels of 92-93%.   The patient was advised to call immediately if he has any concerning symptoms in the interval.  The patient voices understanding of current disease status and treatment options and is in agreement with the current care plan. The total time spent in the appointment was 30 minutes.  All questions were answered. The patient knows to call the clinic with any problems, questions or concerns. We can certainly see the patient much sooner if necessary.  Disclaimer: This note was dictated with voice recognition software. Similar sounding words can inadvertently be transcribed and may not be corrected upon review.

## 2023-08-01 NOTE — Addendum Note (Signed)
 Addended by: Marlene Simas on: 08/01/2023 11:15 AM   Modules accepted: Orders

## 2023-08-04 ENCOUNTER — Other Ambulatory Visit: Payer: Self-pay | Admitting: Internal Medicine

## 2023-08-04 ENCOUNTER — Ambulatory Visit: Payer: Self-pay

## 2023-08-04 DIAGNOSIS — I351 Nonrheumatic aortic (valve) insufficiency: Secondary | ICD-10-CM

## 2023-08-04 DIAGNOSIS — I77810 Thoracic aortic ectasia: Secondary | ICD-10-CM

## 2023-08-04 NOTE — Telephone Encounter (Signed)
 Attempted to call to give echo results, no answer, phone line does not appear to be working.

## 2023-08-16 ENCOUNTER — Other Ambulatory Visit (HOSPITAL_COMMUNITY): Payer: Self-pay | Admitting: Interventional Radiology

## 2023-08-16 ENCOUNTER — Ambulatory Visit (HOSPITAL_COMMUNITY)
Admission: RE | Admit: 2023-08-16 | Discharge: 2023-08-16 | Disposition: A | Discharge status: Home / Self Care | Source: Ambulatory Visit | Attending: Interventional Radiology | Admitting: Interventional Radiology

## 2023-08-16 DIAGNOSIS — K810 Acute cholecystitis: Secondary | ICD-10-CM | POA: Diagnosis not present

## 2023-08-16 DIAGNOSIS — Z434 Encounter for attention to other artificial openings of digestive tract: Secondary | ICD-10-CM | POA: Diagnosis not present

## 2023-08-16 HISTORY — PX: IR EXCHANGE BILIARY DRAIN: IMG6046

## 2023-08-16 MED ORDER — LIDOCAINE-EPINEPHRINE 1 %-1:100000 IJ SOLN
INTRAMUSCULAR | Status: AC
Start: 2023-08-16 — End: ?
  Filled 2023-08-16: qty 1

## 2023-08-16 MED ORDER — LIDOCAINE-EPINEPHRINE 1 %-1:100000 IJ SOLN
20.0000 mL | Freq: Once | INTRAMUSCULAR | Status: AC
  Administered 2023-08-16: 2 mL via INTRADERMAL

## 2023-08-16 MED ORDER — IOHEXOL 300 MG/ML  SOLN
50.0000 mL | Freq: Once | INTRAMUSCULAR | Status: AC | PRN
  Administered 2023-08-16: 10 mL

## 2023-08-18 NOTE — Telephone Encounter (Signed)
 Attempted to call to discuss echo results, phone # on DPR not working. Unable to get updated # from family member listed on DPR.

## 2023-08-18 NOTE — Telephone Encounter (Signed)
-----   Message from Gaylyn Keas sent at 07/28/2023  7:19 PM EDT ----- Echo showed normal pumping function of the heart muscle with mildly thickened heart muscle and increased difference of the heart called diastolic dysfunction, mild aortic valve stenosis and mildly dilated ascending aorta at 41 mm.  Repeat echo in 1 year for aortic stenosis and dilated aorta

## 2023-08-24 NOTE — Addendum Note (Signed)
 Addended by: Cherylyn Cos on: 08/24/2023 03:05 PM   Modules accepted: Orders

## 2023-08-24 NOTE — Telephone Encounter (Signed)
 Call to patient to advise echo showed normal pumping function of the heart muscle with mildly thickened heart muscle and increased difference of the heart called diastolic dysfunction, mild aortic valve stenosis and mildly dilated ascending aorta at 41 mm. Patient agrees to repeat echo in 1 year for aortic stenosis and dilated aorta.

## 2023-08-30 ENCOUNTER — Other Ambulatory Visit: Payer: Self-pay | Admitting: Physician Assistant

## 2023-08-30 DIAGNOSIS — E039 Hypothyroidism, unspecified: Secondary | ICD-10-CM

## 2023-09-07 ENCOUNTER — Encounter (INDEPENDENT_AMBULATORY_CARE_PROVIDER_SITE_OTHER): Payer: Medicare Other | Admitting: Ophthalmology

## 2023-09-07 DIAGNOSIS — I10 Essential (primary) hypertension: Secondary | ICD-10-CM | POA: Diagnosis not present

## 2023-09-07 DIAGNOSIS — H43813 Vitreous degeneration, bilateral: Secondary | ICD-10-CM | POA: Diagnosis not present

## 2023-09-07 DIAGNOSIS — H348311 Tributary (branch) retinal vein occlusion, right eye, with retinal neovascularization: Secondary | ICD-10-CM

## 2023-09-07 DIAGNOSIS — H338 Other retinal detachments: Secondary | ICD-10-CM

## 2023-09-07 DIAGNOSIS — T85518A Breakdown (mechanical) of other gastrointestinal prosthetic devices, implants and grafts, initial encounter: Secondary | ICD-10-CM

## 2023-09-07 DIAGNOSIS — H35033 Hypertensive retinopathy, bilateral: Secondary | ICD-10-CM | POA: Diagnosis not present

## 2023-09-12 ENCOUNTER — Inpatient Hospital Stay (HOSPITAL_COMMUNITY): Admission: RE | Admit: 2023-09-12 | Source: Ambulatory Visit

## 2023-09-13 ENCOUNTER — Ambulatory Visit (HOSPITAL_COMMUNITY)
Admission: RE | Admit: 2023-09-13 | Discharge: 2023-09-13 | Disposition: A | Discharge status: Home / Self Care | Source: Ambulatory Visit | Attending: Radiology | Admitting: Radiology

## 2023-09-13 DIAGNOSIS — Z434 Encounter for attention to other artificial openings of digestive tract: Secondary | ICD-10-CM | POA: Diagnosis not present

## 2023-09-13 DIAGNOSIS — T85518A Breakdown (mechanical) of other gastrointestinal prosthetic devices, implants and grafts, initial encounter: Secondary | ICD-10-CM | POA: Insufficient documentation

## 2023-09-13 HISTORY — PX: IR RADIOLOGIST EVAL & MGMT: IMG5224

## 2023-09-13 MED ORDER — LIDOCAINE-EPINEPHRINE 1 %-1:100000 IJ SOLN
INTRAMUSCULAR | Status: AC
  Filled 2023-09-13: qty 1

## 2023-09-13 MED ORDER — IOHEXOL 300 MG/ML  SOLN
50.0000 mL | Freq: Once | INTRAMUSCULAR | Status: AC | PRN
  Administered 2023-09-13: 2 mL

## 2023-09-14 ENCOUNTER — Other Ambulatory Visit (INDEPENDENT_AMBULATORY_CARE_PROVIDER_SITE_OTHER): Payer: Self-pay | Admitting: Radiology

## 2023-09-14 DIAGNOSIS — T85518A Breakdown (mechanical) of other gastrointestinal prosthetic devices, implants and grafts, initial encounter: Secondary | ICD-10-CM

## 2023-09-20 ENCOUNTER — Ambulatory Visit (HOSPITAL_COMMUNITY)
Admission: RE | Admit: 2023-09-20 | Discharge: 2023-09-20 | Disposition: A | Discharge status: Home / Self Care | Source: Ambulatory Visit | Attending: Interventional Radiology | Admitting: Interventional Radiology

## 2023-09-20 DIAGNOSIS — T85518A Breakdown (mechanical) of other gastrointestinal prosthetic devices, implants and grafts, initial encounter: Secondary | ICD-10-CM

## 2023-09-20 HISTORY — PX: IR RADIOLOGIST EVAL & MGMT: IMG5224

## 2023-09-20 NOTE — Progress Notes (Signed)
 Mr. Mastrangelo presents 1 week after cholecystectomy tube removal.  He has done very well.  No leakage, fevers, chills.  He had one bout of diarrhea after tube removal, but that resolved spontaneously.  He reports significantly less pain in the RUQ than when the drain was in place.  Discussed red flags for recurrent acute cholecystitis.    Plan to follow up with IR as needed.  Ester Sides, MD Pager: [phone]

## 2023-11-01 ENCOUNTER — Other Ambulatory Visit (HOSPITAL_COMMUNITY)

## 2023-11-04 ENCOUNTER — Other Ambulatory Visit: Payer: Self-pay | Admitting: Cardiology

## 2023-11-04 DIAGNOSIS — I480 Paroxysmal atrial fibrillation: Secondary | ICD-10-CM

## 2023-11-04 NOTE — Telephone Encounter (Signed)
 Prescription refill request for Eliquis  received. Indication:AFLUTTER Last office visit:upcoming Scr:0.88  5/25 Age: 78 Weight:84.4  kg  Prescription refilled

## 2023-11-07 ENCOUNTER — Telehealth: Payer: Self-pay | Admitting: Pulmonary Disease

## 2023-11-07 NOTE — Telephone Encounter (Signed)
 Copied from CRM [id number]. Topic: Appointments - Appointment Scheduling >> Nov 07, 2023 10:16 AM Leila C wrote: Patient/patient representative is calling to schedule an appointment. Refer to attachments for appointment information.  Patient states the breathing in the last few months, difficulty breathing, shortness of breath, winded easily, and oxygen  level declined. Patient states is not having any symptoms right now. Patient has an upcoming appointment with pcp Dr. Garald 11/16/23 and cardiologist Dr. Shlomo 11/17/23. Offered 11/11/23 and 11/23/23 with Dr. Kara. Patient wants to wait for CT scan then see Dr. Kara. Patient states will have CT scan 11/22/23 and f/u with Dr. Sherrod on 12/06/23. Scheduled with Dr. Kara 12/26/23 at 1:15 pm. >> Nov 07, 2023 10:29 AM Leila C wrote: Patient is requesting an email [email or web address] or text on the phone [phone] on this appointment information or confirmation with Dr. Kara 12/26/23 at 1:15 pm. Patient states computer is not available for Mychart. Please advise.

## 2023-11-16 ENCOUNTER — Encounter: Payer: Self-pay | Admitting: Internal Medicine

## 2023-11-16 ENCOUNTER — Ambulatory Visit: Admitting: Internal Medicine

## 2023-11-16 ENCOUNTER — Other Ambulatory Visit: Payer: Self-pay | Admitting: Internal Medicine

## 2023-11-16 VITALS — BP 130/70 | HR 69 | Temp 98.2°F | Ht 71.0 in | Wt 175.6 lb

## 2023-11-16 DIAGNOSIS — M545 Low back pain, unspecified: Secondary | ICD-10-CM | POA: Diagnosis not present

## 2023-11-16 DIAGNOSIS — C9110 Chronic lymphocytic leukemia of B-cell type not having achieved remission: Secondary | ICD-10-CM | POA: Diagnosis not present

## 2023-11-16 DIAGNOSIS — C3492 Malignant neoplasm of unspecified part of left bronchus or lung: Secondary | ICD-10-CM | POA: Diagnosis not present

## 2023-11-16 DIAGNOSIS — E785 Hyperlipidemia, unspecified: Secondary | ICD-10-CM

## 2023-11-16 DIAGNOSIS — R0609 Other forms of dyspnea: Secondary | ICD-10-CM

## 2023-11-16 DIAGNOSIS — I4892 Unspecified atrial flutter: Secondary | ICD-10-CM | POA: Diagnosis not present

## 2023-11-16 DIAGNOSIS — K8020 Calculus of gallbladder without cholecystitis without obstruction: Secondary | ICD-10-CM

## 2023-11-16 MED ORDER — ATENOLOL 25 MG PO TABS: 25.0000 mg | ORAL_TABLET | Freq: Three times a day (TID) | ORAL | 0 refills | Status: DC | PRN

## 2023-11-16 NOTE — Assessment & Plan Note (Signed)
 Chronic - GB drain is out after 20 months

## 2023-11-16 NOTE — Assessment & Plan Note (Addendum)
 F/u w/Dr Sherrod Chest CT is pending

## 2023-11-16 NOTE — Assessment & Plan Note (Addendum)
 Chronic  Chest CT is pending On O2 [place]

## 2023-11-16 NOTE — Progress Notes (Unsigned)
 Date:  11/17/2023   ID:  Joe [name], DOB 08/03/1945, MRN [id number]   PCP:  Joe Karlynn GAILS, MD  Cardiologist: Wilbert Bihari, MD Electrophysiologist:  None   Chief Complaint:  HTN, PAF, OSA  History of Present Illness:    Joe [name] is a 78 y.o. male  with a hx of HTN.  He was seen by me in 2015 when he had an EKG that the PCP though was afib but after review there was no findings of afib on EKG. He saw Dr. Garald in March and was placed on Atenolol  50mg  PRN for palpitations.    He underwent home sleep study which showed mild OSA with an AHI of 9.2/hr with nocturnal hypoxemia with O2 sat 75% and mild snoring.  He underwent CPAP titration to 9cm H2O.    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  He underwent XRT and tumor continues to shrink as much as 75%.  He has been txd with immunotherapy and chemo with Carboplatin  and Paclitaxel .    He was dx with cholecystitis and underwent biliary drain as he was felt to be a poor candidate for surgery.  He is here today for follow-up.  He continues to have chronic DOE due to COPD and uses O2 during the day.  He is followed by pulmonary.  He has noticed increased SOB over the summer months that he thinks is partly related to the heat and sees pulmonary in another month.  He denies any chest pain or pressure, PND, orthopnea, lower extremity edema, dizziness(except for vertigo) or syncope. He says that 8/11 in the am he had had a few cups of coffee and at 11am he developed hard palpitations with HR 83 to 140's.  The palpitations stopped a few hours later but HR was still has high as 120's.  At [facility] all his sx stopped.  2 days later he got up a 6AM and felt palpitations and HR was 90-137bpm and lasted for about 5 hours.  The palpitations  stopped in the evening.  He had another episode on 8/23 and noticed that his HR was up to 101bpm but did not feel like he was in afib.  He has never had any afib last more than 30 minutes in the past. He tells me that he did not have any Atenolol  that was not expired.    He is doing well with his PAP device and thinks that he has gotten used to it.  He tolerates the mask and feels the pressure is adequate.  Since going on PAP he feels rested in the am and has no significant daytime sleepiness.  He denies any significant mouth or nasal dryness or nasal congestion.  He does not think that he snores. An Epworth Sleepiness Scale score was calculated the office today and this endorsed at 3 arguing against residual daytime sleepiness. Patient denies any episodes of bruxism, restless legs, No gagging hallucinations or cataplectic events.     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   chronic stage 1- no symtoms   CLL (chronic lymphocytic leukemia) (HCC) 08/08/2014   12/17/2014 Dr Lanny Stage 0   COPD mixed type (HCC) 07/26/2007   Smoker - stopped 6/14    De Quervain's tenosynovitis, right 2014-01-08   2015    Depression    at times   Dysrhythmia    PAF   Dysuria 08-Jan-2014   9/15 - poss stricture Urol ref was offered    Gallstones 11/16/2017   Asymptomatic Pt refused surg ref   Generalized anxiety disorder 09/07/2012   Chronic   Potential benefits of a long term steroid  use as well as potential risks  and complications were explained to the patient and were  aknowledged.      GERD 12/02/2006   Chronic      Grade I diastolic dysfunction 01/20/2022   Grief 06-09-2016   [name] died in 12-17-2014   Gynecomastia 01-08-14   Benign B 2015    Hyperlipidemia    Hypertension    Hypothyroidism 12/25/2014   Dec 17, 2014 On Levothyroxine     Intertrigo 02/02/2012   11/13    Neoplasm of uncertain behavior of skin 03/12/2013   12/14 R ear, chest    OSA on CPAP    mild with AHI 9/hr and oxygen  desats as low as 75%   Paresis (HCC)    right- s/p cerv decompression   PERIORBITAL CELLULITIS 02/22/2009   Qualifier: Diagnosis of  By: Avelina MD, Amy     Retinal detachment    L>>R   Past Surgical History:  Procedure Laterality Date   BICEPS TENDON REPAIR     BRONCHIAL BIOPSY  10/23/2019   Procedure: BRONCHIAL BIOPSIES;  Surgeon: Shelah Lamar RAMAN, MD;  Location: [facility];  Service: Pulmonary;;   BRONCHIAL BIOPSY  11/20/2019   Procedure: BRONCHIAL BIOPSIES;  Surgeon: Shelah Lamar RAMAN, MD;  Location: [facility];  Service: Pulmonary;;   BRONCHIAL BRUSHINGS  10/23/2019   Procedure: BRONCHIAL BRUSHINGS;  Surgeon: Shelah Lamar RAMAN, MD;  Location: [facility];  Service: Pulmonary;;   BRONCHIAL BRUSHINGS  11/20/2019   Procedure: BRONCHIAL BRUSHINGS;  Surgeon: Shelah Lamar RAMAN, MD;  Location: [facility];  Service: Pulmonary;;   BRONCHIAL NEEDLE ASPIRATION BIOPSY  10/23/2019   Procedure: BRONCHIAL NEEDLE ASPIRATION BIOPSIES;  Surgeon: Shelah Lamar RAMAN, MD;  Location: [facility];  Service: Pulmonary;;   BRONCHIAL NEEDLE ASPIRATION BIOPSY  11/20/2019   Procedure: BRONCHIAL NEEDLE ASPIRATION BIOPSIES;  Surgeon: Shelah Lamar RAMAN, MD;  Location: [facility];  Service: Pulmonary;;   BRONCHIAL WASHINGS  11/20/2019   Procedure: BRONCHIAL WASHINGS;  Surgeon: Shelah Lamar RAMAN, MD;  Location: [facility];  Service: Pulmonary;;   CATARACT EXTRACTION Left    COLONOSCOPY  04-14-99   Dr Margaretann polyp-TA in epic   IR BALLOON DILATION OF BILIARY DUCTS/AMPULLA  05/04/2022   IR CONVERT BILIARY  DRAIN TO INT EXT BILIARY DRAIN  05/04/2022   IR CONVERT BILIARY DRAIN TO INT EXT BILIARY DRAIN  05/27/2022   IR EXCHANGE BILIARY DRAIN  02/02/2022   IR EXCHANGE BILIARY DRAIN  03/19/2022   IR EXCHANGE BILIARY DRAIN  07/13/2022   IR EXCHANGE BILIARY DRAIN  09/07/2022   IR EXCHANGE BILIARY DRAIN  11/30/2022   IR EXCHANGE BILIARY DRAIN  02/22/2023  IR EXCHANGE BILIARY DRAIN  05/24/2023   IR EXCHANGE BILIARY DRAIN  08/16/2023   IR PERC CHOLECYSTOSTOMY  01/21/2022   IR RADIOLOGIST EVAL & MGMT  04/08/2022   IR RADIOLOGIST EVAL & MGMT  05/17/2022   IR RADIOLOGIST EVAL & MGMT  09/13/2023   IR RADIOLOGIST EVAL & MGMT  09/20/2023   IR REMOVAL OF CALCULI/DEBRIS BILIARY DUCT/GB  05/04/2022   POLYPECTOMY  04-14-99   POSTERIOR LAMINECTOMY / DECOMPRESSION CERVICAL SPINE     Dr Onetha   RETINAL DETACHMENT SURGERY     left eye, 2007 x2, 2008 x 3   ROTATOR CUFF REPAIR  2004   right   TONSILLECTOMY  [id number],[id number]   VIDEO BRONCHOSCOPY WITH ENDOBRONCHIAL NAVIGATION N/A 10/23/2019   Procedure: VIDEO BRONCHOSCOPY WITH ENDOBRONCHIAL NAVIGATION;  Surgeon: Shelah Lamar RAMAN, MD;  Location: [facility];  Service: Pulmonary;  Laterality: N/A;   VIDEO BRONCHOSCOPY WITH ENDOBRONCHIAL NAVIGATION N/A 11/20/2019   Procedure: VIDEO BRONCHOSCOPY WITH ENDOBRONCHIAL NAVIGATION;  Surgeon: Shelah Lamar RAMAN, MD;  Location: [facility];  Service: Pulmonary;  Laterality: N/A;   VIDEO BRONCHOSCOPY WITH ENDOBRONCHIAL ULTRASOUND N/A 10/23/2019   Procedure: VIDEO BRONCHOSCOPY WITH ENDOBRONCHIAL ULTRASOUND;  Surgeon: Shelah Lamar RAMAN, MD;  Location: [facility];  Service: Pulmonary;  Laterality: N/A;     Current Meds  Medication Sig   albuterol  (PROVENTIL ) (2.5 MG/3ML) 0.083% nebulizer solution Take 3 mLs (2.5 mg total) by nebulization every 6 (six) hours as needed for wheezing or shortness of breath.   amLODipine  (NORVASC ) 5 MG tablet TAKE 1 TABLET BY MOUTH EVERY DAY   atenolol  (TENORMIN ) 25 MG tablet Take 1 tablet (25 mg total) by mouth 3 (three) times  daily as needed (palpitations).   Cholecalciferol  25 MCG (1000 UT) tablet Take 1,000 Units by mouth daily.   diazepam  (VALIUM ) 5 MG tablet Take 1 tablet (5 mg total) by mouth every 12 (twelve) hours as needed for anxiety.   ELIQUIS  5 MG TABS tablet TAKE 1 TABLET(5 MG) BY MOUTH TWICE DAILY   levothyroxine  (SYNTHROID ) 137 MCG tablet TAKE 1 TABLET(137 MCG) BY MOUTH DAILY BEFORE BREAKFAST   lovastatin  (MEVACOR ) 20 MG tablet TAKE 1 TABLET BY MOUTH EVERY NIGHT AT BEDTIME   Multiple Vitamin (MULTIVITAMIN) tablet Take 1 tablet by mouth daily. Centrum Silver.   Polyethyl Glycol-Propyl Glycol (SYSTANE ULTRA OP) Place 1 drop into both eyes 2 (two) times daily as needed (dry eyes).   [DISCONTINUED] Sodium Chloride  Flush (NORMAL SALINE FLUSH) 0.9 % SOLN Flush gallbladder drain with 5 - 10 ml once daily     Allergies:   Patient has no known allergies.   Social History   Tobacco Use   Smoking status: Former    Current packs/day: 0.00    Average packs/day: 0.8 packs/day for 50.0 years (40.0 ttl pk-yrs)    Types: Cigarettes    Start date: 08/28/1962    Quit date: 08/27/2012    Years since quitting: 11.2   Smokeless tobacco: Never  Vaping Use   Vaping status: Never Used  Substance Use Topics   Alcohol  use: Not Currently   Drug use: No     Family Hx: The patient's family history includes Breast cancer (age of onset: 79) in his paternal aunt; COPD in his mother; Cancer in his paternal aunt; Coronary artery disease in an other family member; Diabetes in his father. There is no history of Colon cancer.  ROS:   Please see the history of present illness.     All other systems reviewed and are  negative.   Labs/Other Tests and Data Reviewed:    EKG Interpretation Date/Time:  Thursday November 17 2023 08:50:59 EDT Ventricular Rate:  77 PR Interval:  168 QRS Duration:  126 QT Interval:  388 QTC Calculation: 439 R Axis:   88  Text Interpretation: Normal sinus rhythm Right bundle branch block When  compared with ECG of 29-Oct-2022 10:26, No significant change was found Confirmed by Shlomo Corning [id number]) on 11/17/2023 9:01:20 AM EKG Interpretation Date/Time:  Thursday November 17 2023 08:50:59 EDT Ventricular Rate:  77 PR Interval:  168 QRS Duration:  126 QT Interval:  388 QTC Calculation: 439 R Axis:   88  Text Interpretation: Normal sinus rhythm Right bundle branch block When compared with ECG of 29-Oct-2022 10:26, No significant change was found Confirmed by Shlomo Corning ([id number]) on 11/17/2023 9:01:20 AM     Recent Labs: 07/25/2023: ALT 13; BUN 8; Creatinine 0.88; Hemoglobin 15.0; Platelet Count 318; Potassium 4.2; Sodium 139; TSH 1.420   Recent Lipid Panel Lab Results  Component Value Date/Time   CHOL 153 11/23/2018 10:35 AM   TRIG 201.0 (H) 11/23/2018 10:35 AM   HDL 38.20 (L) 11/23/2018 10:35 AM   CHOLHDL 4 11/23/2018 10:35 AM   LDLCALC 80 11/07/2017 08:59 AM   LDLDIRECT 99.0 11/23/2018 10:35 AM    Wt Readings from Last 3 Encounters:  11/17/23 180 lb 9.6 oz (81.9 kg)  11/16/23 175 lb 9.6 oz (79.7 kg)  08/01/23 186 lb 1.6 oz (84.4 kg)     Objective:    Vital Signs:  BP 126/60   Pulse 77   Ht 5' 11 (1.803 m)   Wt 180 lb 9.6 oz (81.9 kg)   SpO2 99%   BMI 25.19 kg/m    GEN: Well nourished, well developed in no acute distress HEENT: Normal NECK: No JVD; No carotid bruits LYMPHATICS: No lymphadenopathy CARDIAC:RRR, no rubs, gallops 2/6 SM at RUSB to LLSB RESPIRATORY:  Clear to auscultation without rales, wheezing or rhonchi  ABDOMEN: Soft, non-tender, non-distended MUSCULOSKELETAL:  No edema; No deformity  SKIN: Warm and dry NEUROLOGIC:  Alert and oriented x 3 PSYCHIATRIC:  Normal affect  ASSESSMENT & PLAN:    OSA - The patient is tolerating PAP therapy well without any problems. The PAP download performed by his DME was personally reviewed and interpreted by me today and showed an AHI of 0.1 /hr on 9 cm H2O with 100% compliance in using more than 4 hours  nightly.  The patient has been using and benefiting from PAP use and will continue to benefit from therapy.   HTN - BP controlled on exam today - Continue amlodipine  5 mg daily with as needed refills  PAF - He is maintaining normal sinus rhythm on exam today - He has recently had several episodes of afib lasting all day and unfortunately his atenolol  was old and he did not use it.  - No bleeding issues on DOAC - Continue apixaban  5 mg twice daily as well as atenolol  25 mg 3 times daily as needed for palpitation breakthrough - I have personally reviewed and interpreted outside labs performed by patient's PCP which showed hemoglobin 15, serum creatinine 0.88 and potassium 4.2155 2025 -I am going to get a 2 week ziopatch to assess afib burden  Aortic insufficiency/aortic stenosis -mild to moderate AI by echo 02/2021 -Mild AI and mild to moderate aortic stenosis by echo 08/03/2022 with mean aortic valve gradient 13 mmHg and DVI 0.38, V-max 2.48 m/s -Repeat 2D echo 07/2023: EF  60 to 65% with G1 DD, trivial AI, mild AS V-max 2.4 m/s and mean AVG 12 mmHg with DI 0.51 -Repeat echo 07/2024  Coronary artery calcifications -noted on chest CT -Iexiscan myoview  no ischemia 2022 -Denies any anginal symptoms -No aspirin  due to DOAC -Continue lovastatin  20 mg daily with as needed refills  Bradycardia -had been transient and noted during lung CA treatment and found to be hypothyroid -on Synthroid  with resolution  Ascending aortic dilatation -42mm on echo 02/2021 and 41mm on Chest CT 06/2021  -38 mm on chest CT 08/2022 -42 mm on echo 07/2022 -41 mm on echo 07/2023  Hyperlipidemia -LDL goal less than 70 -Continue lovastatin  20 mg daily -Check FLP and ALT  Medication Adjustments/Labs and Tests Ordered: Current medicines are reviewed at length with the patient today.  Concerns regarding medicines are outlined above.  Tests Ordered: Orders Placed This Encounter  Procedures   EKG 12-Lead    Medication Changes: No orders of the defined types were placed in this encounter.   Disposition:  Follow up in 1 year(s)  Signed, Wilbert Bihari, MD  11/17/2023 9:03 AM    [facility]

## 2023-11-16 NOTE — Assessment & Plan Note (Signed)
 Worse Tylenol  prn

## 2023-11-16 NOTE — Assessment & Plan Note (Signed)
 Chronic  Cont on Lovastatin  Check lipids

## 2023-11-16 NOTE — Progress Notes (Signed)
 Subjective:  Patient ID: Joe [name], male    DOB: Dec 02, 1945  Age: 78 y.o. MRN: [id number]  CC: Medical Management of Chronic Issues, Ear Fullness (Ear popping, when swallowing, hears echos when he speaks bilateral ), and Back Pain (Lumbar back pain. L1 & L4, right side pain)   HPI ZYAN COBY presents for Medical Ear Fullness (Ear popping, when swallowing, hears echos when he speaks bilateral ), and Back Pain (Lumbar back pain. L1 & L4, right side pain). SOB is worse. C/o LBP - chronic. F/u on lung cancer and COPD.  Outpatient Medications Prior to Visit  Medication Sig Dispense Refill   albuterol  (PROVENTIL ) (2.5 MG/3ML) 0.083% nebulizer solution Take 3 mLs (2.5 mg total) by nebulization every 6 (six) hours as needed for wheezing or shortness of breath. 75 mL 12   amLODipine  (NORVASC ) 5 MG tablet TAKE 1 TABLET BY MOUTH EVERY DAY 90 tablet 1   Cholecalciferol  25 MCG (1000 UT) tablet Take 1,000 Units by mouth daily.     diazepam  (VALIUM ) 5 MG tablet Take 1 tablet (5 mg total) by mouth every 12 (twelve) hours as needed for anxiety. 180 tablet 1   ELIQUIS  5 MG TABS tablet TAKE 1 TABLET(5 MG) BY MOUTH TWICE DAILY 60 tablet 11   levothyroxine  (SYNTHROID ) 137 MCG tablet TAKE 1 TABLET(137 MCG) BY MOUTH DAILY BEFORE BREAKFAST 30 tablet 2   lovastatin  (MEVACOR ) 20 MG tablet TAKE 1 TABLET BY MOUTH EVERY NIGHT AT BEDTIME 90 tablet 2   Multiple Vitamin (MULTIVITAMIN) tablet Take 1 tablet by mouth daily. Centrum Silver.     Polyethyl Glycol-Propyl Glycol (SYSTANE ULTRA OP) Place 1 drop into both eyes 2 (two) times daily as needed (dry eyes).     Sodium Chloride  Flush (NORMAL SALINE FLUSH) 0.9 % SOLN Flush gallbladder drain with 5 - 10 ml once daily 560 mL 3   atenolol  (TENORMIN ) 25 MG tablet Take 1 tablet (25 mg total) by mouth daily as needed (palpitations). 90 tablet 0   No facility-administered medications prior to visit.    ROS: Review of Systems  Constitutional:  Positive for  fatigue and unexpected weight change. Negative for appetite change.  HENT:  Positive for congestion, ear pain and hearing loss. Negative for nosebleeds, sneezing, sore throat and trouble swallowing.   Eyes:  Negative for itching and visual disturbance.  Respiratory:  Positive for shortness of breath. Negative for cough.   Cardiovascular:  Positive for palpitations. Negative for chest pain and leg swelling.  Gastrointestinal:  Negative for abdominal distention, blood in stool, diarrhea and nausea.  Genitourinary:  Negative for frequency and hematuria.  Musculoskeletal:  Positive for arthralgias, back pain and gait problem. Negative for joint swelling and neck pain.  Skin:  Negative for rash.  Neurological:  Positive for dizziness, weakness and light-headedness. Negative for tremors and speech difficulty.  Psychiatric/Behavioral:  Negative for agitation, dysphoric mood, sleep disturbance and suicidal ideas. The patient is not nervous/anxious.     Objective:  BP 130/70 (BP Location: Left Arm, Patient Position: Sitting)   Pulse 69   Temp 98.2 F (36.8 C) (Temporal)   Ht 5' 11 (1.803 m)   Wt 175 lb 9.6 oz (79.7 kg)   SpO2 95%   BMI 24.49 kg/m   BP Readings from Last 3 Encounters:  11/16/23 130/70  08/01/23 131/79  06/16/23 (!) 144/74    Wt Readings from Last 3 Encounters:  11/16/23 175 lb 9.6 oz (79.7 kg)  08/01/23 186 lb 1.6 oz (84.4  kg)  06/16/23 189 lb (85.7 kg)    Physical Exam Constitutional:      General: He is not in acute distress.    Appearance: He is well-developed. He is obese.     Comments: NAD  HENT:     Right Ear: There is no impacted cerumen.     Left Ear: There is no impacted cerumen.     Nose: Congestion and rhinorrhea present.  Eyes:     Conjunctiva/sclera: Conjunctivae normal.     Pupils: Pupils are equal, round, and reactive to light.  Neck:     Thyroid : No thyromegaly.     Vascular: No JVD.  Cardiovascular:     Rate and Rhythm: Normal rate and  regular rhythm.     Heart sounds: Normal heart sounds. No murmur heard.    No friction rub. No gallop.  Pulmonary:     Effort: Pulmonary effort is normal. No respiratory distress.     Breath sounds: Wheezing present. No rales.  Chest:     Chest wall: No tenderness.  Abdominal:     General: Bowel sounds are normal. There is no distension.     Palpations: Abdomen is soft. There is no mass.     Tenderness: There is no abdominal tenderness. There is no guarding or rebound.  Musculoskeletal:        General: No tenderness. Normal range of motion.     Cervical back: Normal range of motion.  Lymphadenopathy:     Cervical: No cervical adenopathy.  Skin:    General: Skin is warm and dry.     Findings: No rash.  Neurological:     Mental Status: He is alert and oriented to person, place, and time.     Cranial Nerves: No cranial nerve deficit.     Motor: No abnormal muscle tone.     Coordination: Coordination normal.     Gait: Gait normal.     Deep Tendon Reflexes: Reflexes are normal and symmetric.  Psychiatric:        Behavior: Behavior normal.        Thought Content: Thought content normal.        Judgment: Judgment normal.   GB drain is out O2 is on [place] LS w/pain  Lab Results  Component Value Date   WBC 12.7 (H) 07/25/2023   HGB 15.0 07/25/2023   HCT 46.6 07/25/2023   PLT 318 07/25/2023   GLUCOSE 103 (H) 07/25/2023   CHOL 153 11/23/2018   TRIG 201.0 (H) 11/23/2018   HDL 38.20 (L) 11/23/2018   LDLDIRECT 99.0 11/23/2018   LDLCALC 80 11/07/2017   ALT 13 07/25/2023   AST 18 07/25/2023   NA 139 07/25/2023   K 4.2 07/25/2023   CL 103 07/25/2023   CREATININE 0.88 07/25/2023   BUN 8 07/25/2023   CO2 32 07/25/2023   TSH 1.420 07/25/2023   PSA 0.06 (L) 07/22/2021   INR 1.0 05/04/2022    IR Radiologist Eval & Mgmt Result Date: 09/20/2023 EXAM: ESTABLISHED PATIENT OFFICE VISIT CHIEF COMPLAINT: See Epic note. HISTORY OF PRESENT ILLNESS: See Epic note. REVIEW OF SYSTEMS:  See Epic note. PHYSICAL EXAMINATION: See Epic note. ASSESSMENT AND PLAN: See Epic note. Ester Sides, MD [facility] Electronically Signed   By: Ester Sides M.D.   On: 09/20/2023 14:06    Assessment & Plan:   Problem List Items Addressed This Visit     Atrial flutter (HCC) - Primary   Recent  episode of palpitations/A fib Cont w/Eliquis ; Tenormin  50 mg po prn HR 55-70 at home      Relevant Medications   atenolol  (TENORMIN ) 25 MG tablet   Other Relevant Orders   TSH   Urinalysis   T4, free   CLL (chronic lymphocytic leukemia) (HCC)   Monitoring CBC      DOE (dyspnea on exertion)   Chronic  Chest CT is pending On O2 [place]      Dyslipidemia   Chronic  Cont on Lovastatin  Check lipids      Relevant Orders   TSH   Urinalysis   Lipid panel   T4, free   Gallstones   Chronic - GB drain is out after 20 months       Low back pain   Worse Tylenol  prn      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    Dispense:  90 tablet    Refill:  0   atenolol  (TENORMIN ) 25 MG tablet    Sig: Take 1 tablet (25 mg total) by mouth 3 (three) times daily as needed (palpitations).    Dispense:  90 tablet    Refill:  0      Follow-up: Return in about 6 months (around 05/18/2024) for a follow-up visit.  Marolyn Noel, MD

## 2023-11-16 NOTE — Assessment & Plan Note (Signed)
Monitoring CBC 

## 2023-11-16 NOTE — Assessment & Plan Note (Signed)
 Recent episode of palpitations/A fib Cont w/Eliquis ; Tenormin  50 mg po prn HR 55-70 at home

## 2023-11-17 ENCOUNTER — Ambulatory Visit: Payer: Medicare Other | Admitting: Internal Medicine

## 2023-11-17 ENCOUNTER — Ambulatory Visit

## 2023-11-17 ENCOUNTER — Ambulatory Visit: Attending: Cardiology | Admitting: Cardiology

## 2023-11-17 ENCOUNTER — Other Ambulatory Visit: Payer: Self-pay | Admitting: Internal Medicine

## 2023-11-17 ENCOUNTER — Encounter: Payer: Self-pay | Admitting: Cardiology

## 2023-11-17 VITALS — BP 126/60 | HR 77 | Ht 71.0 in | Wt 180.6 lb

## 2023-11-17 DIAGNOSIS — I2510 Atherosclerotic heart disease of native coronary artery without angina pectoris: Secondary | ICD-10-CM | POA: Insufficient documentation

## 2023-11-17 DIAGNOSIS — E785 Hyperlipidemia, unspecified: Secondary | ICD-10-CM | POA: Diagnosis not present

## 2023-11-17 DIAGNOSIS — Z79899 Other long term (current) drug therapy: Secondary | ICD-10-CM | POA: Insufficient documentation

## 2023-11-17 DIAGNOSIS — I10 Essential (primary) hypertension: Secondary | ICD-10-CM | POA: Diagnosis not present

## 2023-11-17 DIAGNOSIS — I480 Paroxysmal atrial fibrillation: Secondary | ICD-10-CM | POA: Insufficient documentation

## 2023-11-17 DIAGNOSIS — I77810 Thoracic aortic ectasia: Secondary | ICD-10-CM | POA: Insufficient documentation

## 2023-11-17 DIAGNOSIS — I351 Nonrheumatic aortic (valve) insufficiency: Secondary | ICD-10-CM | POA: Insufficient documentation

## 2023-11-17 DIAGNOSIS — I350 Nonrheumatic aortic (valve) stenosis: Secondary | ICD-10-CM | POA: Insufficient documentation

## 2023-11-17 DIAGNOSIS — G4733 Obstructive sleep apnea (adult) (pediatric): Secondary | ICD-10-CM | POA: Diagnosis not present

## 2023-11-17 DIAGNOSIS — R001 Bradycardia, unspecified: Secondary | ICD-10-CM | POA: Insufficient documentation

## 2023-11-17 NOTE — Addendum Note (Signed)
 Addended by: JANIT GENI CROME on: 11/17/2023 09:20 AM   Modules accepted: Orders

## 2023-11-17 NOTE — Patient Instructions (Signed)
 Medication Instructions:  Your physician recommends that you continue on your current medications as directed. Please refer to the Current Medication list given to you today.  *If you need a refill on your cardiac medications before your next appointment, please call your pharmacy*  Lab Work: Please complete a FASTING lipid panel and an ALT in our first floor lab before you leave today.  If you have labs (blood work) drawn today and your tests are completely normal, you will receive your results only by: MyChart Message (if you have MyChart) OR A paper copy in the mail If you have any lab test that is abnormal or we need to change your treatment, we will call you to review the results.  Testing/Procedures: Joe [name]- Long Term Monitor Instructions  Your physician has requested you wear a ZIO patch monitor for 14 days.  This is a single patch monitor. [employer] supplies one patch monitor per enrollment. Additional stickers are not available. Please do not apply patch if you will be having a Nuclear Stress Test,  Echocardiogram, Cardiac CT, MRI, or Chest Xray during the period you would be wearing the  monitor. The patch cannot be worn during these tests. You cannot remove and re-apply the  ZIO XT patch monitor.  Your ZIO patch monitor will be mailed 3 day USPS to your address on file. It may take 3-5 days  to receive your monitor after you have been enrolled.  Once you have received your monitor, please review the enclosed instructions. Your monitor  has already been registered assigning a specific monitor serial # to you.  Billing and Patient Assistance Program Information  We have supplied [employer] with any of your insurance information on file for billing purposes. [employer] offers a sliding scale Patient Assistance Program for patients that do not have  insurance, or whose insurance does not completely cover the cost of the ZIO monitor.  You must apply for the Patient Assistance Program to  qualify for this discounted rate.  To apply, please call [employer] at [phone], select option 4, select option 2, ask to apply for  Patient Assistance Program. Joe [name] will ask your household income, and how many people  are in your household. They will quote your out-of-pocket cost based on that information.  [employer] will also be able to set up a 59-month, interest-free payment plan if needed.  Applying the monitor   Shave hair from upper left chest.  Hold abrader disc by orange tab. Rub abrader in 40 strokes over the upper left chest as  indicated in your monitor instructions.  Clean area with 4 enclosed alcohol  pads. Let dry.  Apply patch as indicated in monitor instructions. Patch will be placed under collarbone on left  side of chest with arrow pointing upward.  Rub patch adhesive wings for 2 minutes. Remove white label marked 1. Remove the white  label marked 2. Rub patch adhesive wings for 2 additional minutes.  While looking in a mirror, press and release button in center of patch. A small green light will  flash 3-4 times. This will be your only indicator that the monitor has been turned on.  Do not shower for the first 24 hours. You may shower after the first 24 hours.  Press the button if you feel a symptom. You will hear a small click. Record Date, Time and  Symptom in the Patient Logbook.  When you are ready to remove the patch, follow instructions on the last 2 pages of Patient  Logbook. Stick patch monitor onto the last page of Patient Logbook.  Place Patient Logbook in the blue and white box. Use locking tab on box and tape box closed  securely. The blue and white box has prepaid postage on it. Please place it in the mailbox as  soon as possible. Your physician should have your test results approximately 7 days after the  monitor has been mailed back to [facility].  Call [facility] Customer Care at [phone] if you have questions regarding  your ZIO XT  patch monitor. Call them immediately if you see an orange light blinking on your  monitor.  If your monitor falls off in less than 4 days, contact our Monitor department at [phone].  If your monitor becomes loose or falls off after 4 days call [employer] at [phone] for  suggestions on securing your monitor   Follow-Up: At [facility], you and your health needs are our priority.  As part of our continuing mission to provide you with exceptional heart care, our providers are all part of one team.  This team includes your primary Cardiologist (physician) and Advanced Practice Providers or APPs (Physician Assistants and Nurse Practitioners) who all work together to provide you with the care you need, when you need it.  Your next appointment:   1 year(s)  Provider:   Dr. Wilbert Bihari, MD

## 2023-11-17 NOTE — Progress Notes (Unsigned)
 Enrolled patient for a 14 day Zio XT  monitor to be mailed to patients home

## 2023-11-18 ENCOUNTER — Ambulatory Visit: Payer: Self-pay | Admitting: *Deleted

## 2023-11-18 ENCOUNTER — Other Ambulatory Visit: Payer: Self-pay | Admitting: Internal Medicine

## 2023-11-18 LAB — LIPID PANEL
Chol/HDL Ratio: 2.6 ratio (ref 0.0–5.0)
Cholesterol, Total: 110 mg/dL (ref 100–199)
HDL: 42 mg/dL (ref >39)
LDL Chol Calc (NIH): 52 mg/dL (ref 0–99)
Triglycerides: 80 mg/dL (ref 0–149)
VLDL Cholesterol Cal: 16 mg/dL (ref 5–40)

## 2023-11-18 LAB — ALT: ALT: 16 IU/L (ref 0–44)

## 2023-11-22 ENCOUNTER — Ambulatory Visit (HOSPITAL_COMMUNITY)

## 2023-11-22 ENCOUNTER — Telehealth: Payer: Self-pay | Admitting: Cardiology

## 2023-11-22 ENCOUNTER — Inpatient Hospital Stay

## 2023-11-22 NOTE — Telephone Encounter (Signed)
 Dr. Shlomo ordered Zio heart monitor for pt after last appt and pt wants to make Dr. Shlomo aware that he is unable to put on monitor until after his CT scan on 9/9. Pt also wants to know if starting his heart monitor on 9/10 will be too late. Please advise.   He has not received the monitor yet. Original CT scan was scheduled for today but equipment was down.

## 2023-11-22 NOTE — Telephone Encounter (Signed)
 ZIO monitor was delivered at [facility] 11/19/23.  Patient confirmed.  He just went down to mail box this morning. It will be fine to wait until 11/30/2023, after his CT scan, to apply the monitor. Patient appreciated the return call.

## 2023-11-22 NOTE — Telephone Encounter (Signed)
-----   Message from Wilbert Bihari sent at 11/18/2023 12:45 PM EDT ----- Lipids at goal continue current therapy and forward to PCP ----- Message ----- From: Interface, [facility] Lab Results In Sent: 11/18/2023   1:35 AM EDT To: Wilbert JONELLE Bihari, MD

## 2023-11-22 NOTE — Telephone Encounter (Signed)
 Call to patient to advise Lipids at goal and to continue current therapy. Patient verbalizes understanding. Forwarded results to PCP.

## 2023-11-29 ENCOUNTER — Ambulatory Visit (HOSPITAL_COMMUNITY)
Admission: RE | Admit: 2023-11-29 | Discharge: 2023-11-29 | Disposition: A | Discharge status: Home / Self Care | Source: Ambulatory Visit | Attending: Internal Medicine | Admitting: Internal Medicine

## 2023-11-29 ENCOUNTER — Encounter (HOSPITAL_COMMUNITY): Payer: Self-pay

## 2023-11-29 ENCOUNTER — Inpatient Hospital Stay: Attending: Internal Medicine

## 2023-11-29 DIAGNOSIS — Z9221 Personal history of antineoplastic chemotherapy: Secondary | ICD-10-CM | POA: Diagnosis not present

## 2023-11-29 DIAGNOSIS — J439 Emphysema, unspecified: Secondary | ICD-10-CM | POA: Diagnosis not present

## 2023-11-29 DIAGNOSIS — C9110 Chronic lymphocytic leukemia of B-cell type not having achieved remission: Secondary | ICD-10-CM | POA: Insufficient documentation

## 2023-11-29 DIAGNOSIS — Z923 Personal history of irradiation: Secondary | ICD-10-CM | POA: Insufficient documentation

## 2023-11-29 DIAGNOSIS — C3490 Malignant neoplasm of unspecified part of unspecified bronchus or lung: Secondary | ICD-10-CM | POA: Insufficient documentation

## 2023-11-29 DIAGNOSIS — Z87891 Personal history of nicotine dependence: Secondary | ICD-10-CM | POA: Insufficient documentation

## 2023-11-29 DIAGNOSIS — Z85118 Personal history of other malignant neoplasm of bronchus and lung: Secondary | ICD-10-CM | POA: Diagnosis not present

## 2023-11-29 DIAGNOSIS — I700 Atherosclerosis of aorta: Secondary | ICD-10-CM | POA: Diagnosis not present

## 2023-11-29 LAB — CMP (CANCER CENTER ONLY)
ALT: 13 U/L (ref 0–44)
AST: 15 U/L (ref 15–41)
Albumin: 3.9 g/dL (ref 3.5–5.0)
Alkaline Phosphatase: 88 U/L (ref 38–126)
Anion gap: 4 — ABNORMAL LOW (ref 5–15)
BUN: 6 mg/dL — ABNORMAL LOW (ref 8–23)
CO2: 33 mmol/L — ABNORMAL HIGH (ref 22–32)
Calcium: 9.8 mg/dL (ref 8.9–10.3)
Chloride: 102 mmol/L (ref 98–111)
Creatinine: 0.8 mg/dL (ref 0.61–1.24)
GFR, Estimated: >60 mL/min (ref >60)
Glucose, Bld: 92 mg/dL (ref 70–99)
Potassium: 4.2 mmol/L (ref 3.5–5.1)
Sodium: 139 mmol/L (ref 135–145)
Total Bilirubin: 0.8 mg/dL (ref 0.0–1.2)
Total Protein: 7.5 g/dL (ref 6.5–8.1)

## 2023-11-29 LAB — CBC WITH DIFFERENTIAL (CANCER CENTER ONLY)
Abs Immature Granulocytes: 0.04 K/uL (ref 0.00–0.07)
Basophils Absolute: 0 K/uL (ref 0.0–0.1)
Basophils Relative: 0 %
Eosinophils Absolute: 0.1 K/uL (ref 0.0–0.5)
Eosinophils Relative: 1 %
HCT: 45.6 % (ref 39.0–52.0)
Hemoglobin: 14.7 g/dL (ref 13.0–17.0)
Immature Granulocytes: 0 %
Lymphocytes Relative: 15 %
Lymphs Abs: 1.9 K/uL (ref 0.7–4.0)
MCH: 27.8 pg (ref 26.0–34.0)
MCHC: 32.2 g/dL (ref 30.0–36.0)
MCV: 86.2 fL (ref 80.0–100.0)
Monocytes Absolute: 1.1 K/uL — ABNORMAL HIGH (ref 0.1–1.0)
Monocytes Relative: 9 %
Neutro Abs: 9.1 K/uL — ABNORMAL HIGH (ref 1.7–7.7)
Neutrophils Relative %: 75 %
Platelet Count: 347 K/uL (ref 150–400)
RBC: 5.29 MIL/uL (ref 4.22–5.81)
RDW: 13.3 % (ref 11.5–15.5)
WBC Count: 12.3 K/uL — ABNORMAL HIGH (ref 4.0–10.5)
nRBC: 0 % (ref 0.0–0.2)

## 2023-11-29 MED ORDER — IOHEXOL 300 MG/ML  SOLN
75.0000 mL | Freq: Once | INTRAMUSCULAR | Status: AC | PRN
  Administered 2023-11-29: 75 mL via INTRAVENOUS

## 2023-12-05 ENCOUNTER — Other Ambulatory Visit: Payer: Self-pay | Admitting: Physician Assistant

## 2023-12-05 DIAGNOSIS — E039 Hypothyroidism, unspecified: Secondary | ICD-10-CM

## 2023-12-06 ENCOUNTER — Inpatient Hospital Stay (HOSPITAL_BASED_OUTPATIENT_CLINIC_OR_DEPARTMENT_OTHER): Admitting: Internal Medicine

## 2023-12-06 VITALS — BP 131/83 | HR 75 | Temp 96.5°F | Resp 17 | Ht 71.0 in | Wt 182.1 lb

## 2023-12-06 DIAGNOSIS — Z85118 Personal history of other malignant neoplasm of bronchus and lung: Secondary | ICD-10-CM | POA: Diagnosis not present

## 2023-12-06 DIAGNOSIS — C3490 Malignant neoplasm of unspecified part of unspecified bronchus or lung: Secondary | ICD-10-CM

## 2023-12-06 DIAGNOSIS — C9110 Chronic lymphocytic leukemia of B-cell type not having achieved remission: Secondary | ICD-10-CM | POA: Diagnosis not present

## 2023-12-06 DIAGNOSIS — Z923 Personal history of irradiation: Secondary | ICD-10-CM | POA: Diagnosis not present

## 2023-12-06 DIAGNOSIS — Z87891 Personal history of nicotine dependence: Secondary | ICD-10-CM | POA: Diagnosis not present

## 2023-12-06 DIAGNOSIS — Z9221 Personal history of antineoplastic chemotherapy: Secondary | ICD-10-CM | POA: Diagnosis not present

## 2023-12-06 NOTE — Progress Notes (Signed)
 [facility] [phone]   [phone]  OFFICE PROGRESS NOTE  [name], Karlynn GAILS, MD [address] [place] [address]  DIAGNOSIS: 1) Stage IIIb non-small cell lung cancer, squamous cell carcinoma.  The patient presented with a left upper lobe lung mass as well as associated mediastinal lymphadenopathy and contralateral right hilar mild hypermetabolism.  There was heterogeneous marrow uptake in the spine but with more focal areas at L4 without imaging correlation on CT scan.  The patient was diagnosed in August 2021. 2) CLL stage 0, diagnosed in 2016   PD-L1 expression: 2%   PRIOR THERAPY:  1) Concurrent chemoradiation with carboplatin  for an AUC of 2 and paclitaxel  45 mg per metered squared weekly.  First dose expected on 12/13/2019. Status post 5 cycles.   Last dose was giving January 14, 2020. 2) Consolidation treatment with immunotherapy with Imfinzi  1500 mg IV every 4 weeks.  First dose February 19, 2020.  Status post 13 cycles.   CURRENT THERAPY: Observation.  INTERVAL HISTORY: Joe [name] 78 y.o. male returns to the clinic today for follow-up visit. Discussed the use of AI scribe software for clinical note transcription with the patient, who gave verbal consent to proceed.  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 He completed concurrent chemoradiation with weekly carboplatin  and paclitaxel , followed by one year of consolidation treatment with durvalumab  1500 mg IV every four weeks, with the last dose in November 2022. He has been on observation since then.  He experiences increased shortness of breath, particularly during  activities such as grocery shopping and taking out the garbage, which he attributes to recent hot and humid weather. He has a mild cough in the morning after removing his CPAP machine. No hemoptysis.  He had two episodes of atrial fibrillation on September 11 and 13, each lasting about a day with rapid heartbeat for approximately three hours. He is currently on a heart monitor for 14 days following a normal EKG and echocardiogram.  He has a history of chronic lymphocytic leukemia with an elevated white blood cell count, currently at 12.3, consistent with previous measurements. He also has hepatic steatosis and recent weight loss, although he notes an increase in waistline size. He had a tube removed after a gallbladder infection, which had been in place for 20 months, limiting his ability to exercise.    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      GERD 12/02/2006   Chronic      Grade I diastolic dysfunction 01/20/2022   Grief Jun 05, 2016   [name] died in 01-04-2015    Gynecomastia 01-04-2014   Benign B 2015    Hyperlipidemia    Hypertension    Hypothyroidism 12/25/2014   04-Jan-2015 On Levothyroxine     Intertrigo 02/02/2012   11/13    Neoplasm of uncertain behavior of skin 03/12/2013   12/14 R ear, chest    OSA on CPAP    mild with AHI 9/hr and oxygen  desats as low as 75%   Paresis (HCC)    right- s/p cerv decompression   PERIORBITAL CELLULITIS 02/22/2009   Qualifier: Diagnosis of  By: Avelina MD, Amy     Retinal detachment    L>>R    ALLERGIES:  has no known allergies.  MEDICATIONS:  Current Outpatient Medications  Medication Sig Dispense Refill   albuterol  (PROVENTIL ) (2.5 MG/3ML) 0.083% nebulizer solution Take 3 mLs (2.5 mg total) by nebulization every 6 (six) hours as needed for wheezing or shortness of breath. 75 mL 12   amLODipine  (NORVASC ) 5 MG tablet TAKE 1 TABLET BY MOUTH EVERY DAY 90 tablet 1   atenolol  (TENORMIN ) 25 MG tablet TAKE 1 TABLET(25 MG) BY MOUTH THREE TIMES DAILY AS NEEDED FOR PALPITATIONS 270 tablet 3   Cholecalciferol  25 MCG (1000 UT) tablet Take 1,000 Units by mouth daily.     diazepam  (VALIUM ) 5 MG tablet Take 1 tablet (5 mg total) by mouth every 12 (twelve) hours as needed for anxiety. 180 tablet 1   ELIQUIS  5 MG TABS tablet TAKE 1 TABLET(5 MG) BY MOUTH TWICE DAILY 60 tablet 11   levothyroxine  (SYNTHROID ) 137 MCG tablet TAKE 1 TABLET(137 MCG) BY MOUTH DAILY BEFORE BREAKFAST 30 tablet 2   lovastatin  (MEVACOR ) 20 MG tablet TAKE 1 TABLET BY MOUTH EVERY NIGHT AT BEDTIME 90 tablet 2   Multiple Vitamin (MULTIVITAMIN) tablet Take 1 tablet by mouth daily. Centrum Silver.     Polyethyl Glycol-Propyl Glycol (SYSTANE ULTRA OP) Place 1 drop into both eyes 2 (two) times daily as needed (dry eyes).     No current facility-administered medications for this visit.    SURGICAL HISTORY:  Past Surgical History:  Procedure Laterality Date   BICEPS TENDON REPAIR     BRONCHIAL BIOPSY  10/23/2019   Procedure: BRONCHIAL BIOPSIES;  Surgeon:  Shelah Lamar RAMAN, MD;  Location: [facility] ENDOSCOPY;  Service: Pulmonary;;   BRONCHIAL BIOPSY  11/20/2019   Procedure: BRONCHIAL BIOPSIES;  Surgeon: Shelah Lamar RAMAN, MD;  Location: [facility] ENDOSCOPY;  Service: Pulmonary;;   BRONCHIAL BRUSHINGS  10/23/2019   Procedure: BRONCHIAL BRUSHINGS;  Surgeon: Shelah Lamar RAMAN, MD;  Location[facility] ENDOSCOPY;  Service: Pulmonary;;   BRONCHIAL BRUSHINGS  11/20/2019   Procedure: BRONCHIAL BRUSHINGS;  Surgeon: Shelah Lamar RAMAN, MD;  Location: [facility] ENDOSCOPY;  Service: Pulmonary;;   BRONCHIAL NEEDLE ASPIRATION BIOPSY  10/23/2019   Procedure: BRONCHIAL NEEDLE ASPIRATION BIOPSIES;  Surgeon: Shelah Lamar RAMAN, MD;  Location: [facility] ENDOSCOPY;  Service: Pulmonary;;   BRONCHIAL NEEDLE ASPIRATION BIOPSY  11/20/2019   Procedure:  BRONCHIAL NEEDLE ASPIRATION BIOPSIES;  Surgeon: Shelah Lamar RAMAN, MD;  Location: [facility] ENDOSCOPY;  Service: Pulmonary;;   BRONCHIAL WASHINGS  11/20/2019   Procedure: BRONCHIAL WASHINGS;  Surgeon: Shelah Lamar RAMAN, MD;  Location: [facility] ENDOSCOPY;  Service: Pulmonary;;   CATARACT EXTRACTION Left    COLONOSCOPY  04-14-99   Dr Margaretann polyp-TA in epic   IR BALLOON DILATION OF BILIARY DUCTS/AMPULLA  05/04/2022   IR CONVERT BILIARY DRAIN TO INT EXT BILIARY DRAIN  05/04/2022   IR CONVERT BILIARY DRAIN TO INT EXT BILIARY DRAIN  05/27/2022   IR EXCHANGE BILIARY DRAIN  02/02/2022   IR EXCHANGE BILIARY DRAIN  03/19/2022   IR EXCHANGE BILIARY DRAIN  07/13/2022   IR EXCHANGE BILIARY DRAIN  09/07/2022   IR EXCHANGE BILIARY DRAIN  11/30/2022   IR EXCHANGE BILIARY DRAIN  02/22/2023   IR EXCHANGE BILIARY DRAIN  05/24/2023   IR EXCHANGE BILIARY DRAIN  08/16/2023   IR PERC CHOLECYSTOSTOMY  01/21/2022   IR RADIOLOGIST EVAL & MGMT  04/08/2022   IR RADIOLOGIST EVAL & MGMT  05/17/2022   IR RADIOLOGIST EVAL & MGMT  09/13/2023   IR RADIOLOGIST EVAL & MGMT  09/20/2023   IR REMOVAL OF CALCULI/DEBRIS BILIARY DUCT/GB  05/04/2022   POLYPECTOMY  04-14-99   POSTERIOR LAMINECTOMY / DECOMPRESSION CERVICAL SPINE     Dr  Onetha   RETINAL DETACHMENT SURGERY     left eye, 2007 x2, 2008 x 3   ROTATOR CUFF REPAIR  2004   right   TONSILLECTOMY  8048,8036   VIDEO BRONCHOSCOPY WITH ENDOBRONCHIAL NAVIGATION N/A 10/23/2019   Procedure: VIDEO BRONCHOSCOPY WITH ENDOBRONCHIAL NAVIGATION;  Surgeon: Shelah Lamar RAMAN, MD;  Location: [facility] ENDOSCOPY;  Service: Pulmonary;  Laterality: N/A;   VIDEO BRONCHOSCOPY WITH ENDOBRONCHIAL NAVIGATION N/A 11/20/2019   Procedure: VIDEO BRONCHOSCOPY WITH ENDOBRONCHIAL NAVIGATION;  Surgeon: Shelah Lamar RAMAN, MD;  Location: [facility] ENDOSCOPY;  Service: Pulmonary;  Laterality: N/A;   VIDEO BRONCHOSCOPY WITH ENDOBRONCHIAL ULTRASOUND N/A 10/23/2019   Procedure: VIDEO BRONCHOSCOPY WITH ENDOBRONCHIAL ULTRASOUND;  Surgeon: Shelah Lamar RAMAN, MD;  Location: [facility] ENDOSCOPY;  Service: Pulmonary;  Laterality: N/A;    REVIEW OF SYSTEMS:  Constitutional: positive for fatigue Eyes: negative Ears, nose, mouth, throat, and face: negative Respiratory: positive for cough and dyspnea on exertion Cardiovascular: negative Gastrointestinal: negative Genitourinary:negative Integument/breast: negative Hematologic/lymphatic: negative Musculoskeletal:negative Neurological: negative Behavioral/Psych: negative Endocrine: negative Allergic/Immunologic: negative   PHYSICAL EXAMINATION: General appearance: alert, cooperative, fatigued, and no distress Head: Normocephalic, without obvious abnormality, atraumatic Neck: no adenopathy, no JVD, supple, symmetrical, trachea midline, and thyroid  not enlarged, symmetric, no tenderness/mass/nodules Lymph nodes: Cervical, supraclavicular, and axillary nodes normal. Resp: clear to auscultation bilaterally Back: symmetric, no curvature. ROM normal. No CVA tenderness. Cardio: regular rate and rhythm, S1, S2 normal, no murmur, click, rub or gallop GI: soft, non-tender; bowel sounds normal; no masses,  no organomegaly Extremities: extremities normal, atraumatic, no cyanosis or  edema Neurologic: Alert and oriented X 3, normal strength and tone. Normal symmetric reflexes. Normal coordination and gait  ECOG PERFORMANCE STATUS: 1 - Symptomatic but completely ambulatory  Blood pressure 131/83, pulse 75, temperature (!) 96.5 F (35.8 C), resp. rate 17, height 5' 11 (1.803 m), weight 182 lb 1.6 oz (82.6 kg), SpO2 99%.  LABORATORY DATA: Lab Results  Component Value Date   WBC 12.3 (H) 11/29/2023   HGB 14.7 11/29/2023   HCT 45.6 11/29/2023   MCV 86.2 11/29/2023   PLT 347 11/29/2023      Chemistry  Component Value Date/Time   NA 139 11/29/2023 1220   NA 143 03/10/2017 0911   K 4.2 11/29/2023 1220   K 4.7 03/10/2017 0911   CL 102 11/29/2023 1220   CO2 33 (H) 11/29/2023 1220   CO2 30 (H) 03/10/2017 0911   BUN 6 (L) 11/29/2023 1220   BUN 10.7 03/10/2017 0911   CREATININE 0.80 11/29/2023 1220   CREATININE 0.8 03/10/2017 0911      Component Value Date/Time   CALCIUM 9.8 11/29/2023 1220   CALCIUM 9.4 03/10/2017 0911   ALKPHOS 88 11/29/2023 1220   ALKPHOS 76 03/10/2017 0911   AST 15 11/29/2023 1220   AST 34 03/10/2017 0911   ALT 13 11/29/2023 1220   ALT 45 03/10/2017 0911   BILITOT 0.8 11/29/2023 1220   BILITOT 1.41 (H) 03/10/2017 0911       RADIOGRAPHIC STUDIES: CT Chest W Contrast Result Date: 12/03/2023 CLINICAL DATA:  Non-small-cell lung cancer restaging * Tracking Code: BO * EXAM: CT CHEST WITH CONTRAST TECHNIQUE: Multidetector CT imaging of the chest was performed during intravenous contrast administration. RADIATION DOSE REDUCTION: This exam was performed according to the departmental dose-optimization program which includes automated exposure control, adjustment of the mA and/or kV according to patient size and/or use of iterative reconstruction technique. CONTRAST:  75mL OMNIPAQUE  IOHEXOL  300 MG/ML  SOLN COMPARISON:  07/25/2023 FINDINGS: Cardiovascular: Aortic atherosclerosis. Unchanged enlargement of the tubular ascending thoracic aorta  measuring 4.0 x 4.0 cm. Normal heart size. Left coronary artery calcifications. No pericardial effusion. Mediastinum/Nodes: Unchanged prominent subcarinal lymph node measuring 2.1 x 0.8 cm (series 2, image 75). Thyroid  gland, trachea, and esophagus demonstrate no significant findings. Lungs/Pleura: Interval increase in size and solid character of a treated mass in the anterior left upper lobe, with increased bronchial obstruction, measuring 5.0 x 4.1 cm, previously 5.0 x 3.0 cm when measured with similar technique (series 2, image 57). Interval increase in size and solid character of adjacent spiculated nodules in the peripheral right upper lobe superiorly measuring 2.5 x 1.9 cm, previously much more amorphous and less solid, measuring 2.3 x 2.1 cm (series 7, image 43), inferiorly measuring 1.6 x 1.3 cm, previously 1.2 x 1.2 cm (series 7, image 47). Unchanged nodule in the anterior inferior right upper lobe measuring 0.6 cm (series 7, image 75). Severe emphysema. Diffuse bilateral bronchial wall thickening. No pleural effusion or pneumothorax. Upper Abdomen: No acute abnormality. Nonobstructive right renal calculi. Gallstones contracted in the gallbladder. Hepatic steatosis. Coarse contour of the liver. Musculoskeletal: No chest wall abnormality. No acute osseous findings. IMPRESSION: 1. Interval increase in size and solid character of a treated mass in the anterior left upper lobe, with increased bronchial obstruction, highly concerning for progressing malignancy. PET-CT may be helpful to assess for metabolically active disease in this vicinity. 2. Interval increase in size and solid character of adjacent spiculated nodules in the peripheral right upper lobe superiorly measuring 2.5 x 1.9 cm, previously much more amorphous and less solid, measuring 2.3 x 2.1 cm, inferiorly measuring 1.6 x 1.3 cm, previously 1.2 x 1.2 cm. Findings are consistent with progressive metachronous or metastatic malignancy. 3. Unchanged  prominent subcarinal lymph node, nonspecific. 4. Unchanged nonspecific nodule in the anterior inferior right upper lobe measuring 0.6 cm. 5. Severe emphysema and diffuse bilateral bronchial wall thickening. 6. Coronary artery disease. 7. Hepatic steatosis. Coarse contour of the liver, suggestive of cirrhosis. 8. Nonobstructive right nephrolithiasis. Aortic Atherosclerosis (ICD10-I70.0) and Emphysema (ICD10-J43.9). Electronically Signed   By: Marolyn JONETTA Marlyce CHRISTELLA.D.  On: 12/03/2023 08:22     ASSESSMENT AND PLAN: This is a very pleasant 78 years old white male with a stage IIIb non-small cell lung cancer, squamous cell carcinoma diagnosed in August of 2021.  The patient also has a history of stage 0 CLL.  He underwent a course of concurrent chemoradiation with weekly carboplatin  for AUC of 2 and paclitaxel  45 mg/M2 status post 5 cycles of the treatment.  He tolerated this treatment well with no concerning adverse effect except for mild odynophagia and dysphagia as well as thrombocytopenia. His scan showed improvement of his disease with decrease in the size of the left upper lobe lung mass as well as decrease in the mediastinal lymphadenopathy and no evidence of metastatic disease. The patient completed consolidation treatment with immunotherapy with Imfinzi  1500 mg IV every 4 weeks status post 13 cycles. Previous CT scan of the chest showed the left upper lobe postradiation changes with associated areas of increased soft tissue concerning for evolving postradiation changes or recurrent disease and PET scan was recommended for further evaluation.  He also had slightly increased size of subcarinal lymph node.  The patient had a PET scan performed on January 15, 2022 and that showed a stable appearance of the masslike architecture and fibrosis with volume loss involving the left upper lobe and left upper hilar region with low-level tracer uptake with no signs of tracer avid tumor recurrence or metastatic disease.  He  continues to have regular CT scan of the chest for evaluation of his condition.  He is currently on observation. He had repeat CT scan of the chest on 11/29/2023 and it showed interval increase in size and solid the anterior left lobe and again suspicious for malignancy.  There was also interval increase in size and solid spiculated nodule in the right upper lobe superiorly. I personally independently reviewed the scan images and discussed it with the patient today. Assessment and Plan Assessment & Plan Non-small cell lung cancer, right upper lobe, post-chemoradiation, under surveillance Non-small cell lung cancer in the right upper lobe, previously treated with chemoradiation and durvalumab , currently under surveillance. Recent CT scan shows increase in size of a nodule in the right upper lobe, measuring 1.6 x 1.3 cm, previously 1.1-1.2 x 1.3 cm. Another nodule in the right upper lobe measures 2.5 x 1.9 cm, also showing increased density. Previous PET scan in December was not concerning. Decision made to monitor with repeat CT scan in three months due to cost considerations of PET scan. - Repeat CT scan in three months to monitor nodule size and density changes. - Provide copies of CT scan and blood work to him.  Chronic lymphocytic leukemia, stable, under observation Chronic lymphocytic leukemia remains stable under observation. Current white blood cell count is 12.3, slightly decreased from 12.7 in May and 12.9 in January. - Continue monitoring white blood cell count. The patient was advised to call immediately if he has any concerning symptoms in the interval. The patient voices understanding of current disease status and treatment options and is in agreement with the current care plan. The total time spent in the appointment was 30 minutes.  All questions were answered. The patient knows to call the clinic with any problems, questions or concerns. We can certainly see the patient much sooner if  necessary.  Disclaimer: This note was dictated with voice recognition software. Similar sounding words can inadvertently be transcribed and may not be corrected upon review.

## 2023-12-24 DIAGNOSIS — I480 Paroxysmal atrial fibrillation: Secondary | ICD-10-CM | POA: Diagnosis not present

## 2023-12-26 ENCOUNTER — Ambulatory Visit: Admitting: Pulmonary Disease

## 2023-12-26 ENCOUNTER — Encounter: Payer: Self-pay | Admitting: Pulmonary Disease

## 2023-12-26 VITALS — BP 147/77 | HR 71 | Ht 71.0 in | Wt 180.0 lb

## 2023-12-26 DIAGNOSIS — J9611 Chronic respiratory failure with hypoxia: Secondary | ICD-10-CM

## 2023-12-26 DIAGNOSIS — J449 Chronic obstructive pulmonary disease, unspecified: Secondary | ICD-10-CM

## 2023-12-26 NOTE — Progress Notes (Signed)
 Synopsis: Hx of COPD, lung cancer  Subjective:   PATIENT ID: Joe [name] GENDER: male DOB: May 16, 1945, MRN: [id number]  HPI  Chief Complaint  Patient presents with   Medical Management of Chronic Issues    Pt states noticed his SOB is getting worse / cough past few months    77yM with stage IIIb NSCLC, squamous cell carcinoma dx 10/2019 who originally presented with LUL mass and mediastinal LAD, contralateral R hilar hypermetabolism who is s/p chemoXRT and consolidation IT with imfinzi  s/p 13 cycles, CLL dx 2016, COPD, pAF, hypothyroid, GERD, OSA on CPAP.   OV 06/08/23 He experiences worsening breathing difficulties, particularly when bending over or performing activities such as taking out the garbage or bringing in groceries. He uses a CPAP machine and has decreased oxygen  saturation levels, previously in the upper 90s, now dropping to the low 90s or high 80s during certain activities. No current wheezing or persistent cough, though he experienced a sinus infection recently, causing congestion and a temporary cough. Breathing issues are exacerbated by physical inactivity and positional changes, such as bending over.  He has a history of chronic lymphocytic leukemia diagnosed in 2016 and lung cancer diagnosed in 2021. He underwent six weeks of concurrent chemoradiotherapy followed by a year of durvalumab , completing this treatment in October 2022. He experienced pneumonitis post-treatment, which led to his initial consultation with Dr. Gretel. Recent imaging in December showed two nodules, one of which increased in size, leading to three radiation treatments. He is scheduled for a follow-up CT scan in May.  He also has a history of atrial fibrillation and sleep apnea. He uses oxygen  therapy and has previously been on steroids for respiratory failure. He has not used albuterol  in over a year and a half and is not currently on any inhalers.  In 2023, he had a gallbladder infection and was  deemed high risk for surgery, leading to the placement of a biliary stent, which requires replacement every 12 weeks.  He experiences limitations in physical activity due to the stent and reports missing his previous exercise routine, which included weights and a stationary bike.  OV 12/26/23 He experiences increased dyspnea at rest and significant oxygen  desaturation during exertion, with levels dropping to 81%. He uses an Engineer, water, recently increasing the setting to four to maintain saturation above 90% during activities. Albuterol  is available but not recently used.  Recent CT scan is showing increase in right upper lobe nodules, followed by Oncology with plan to repeat CT scan in December.  He experiences a dry cough five to six times daily, mostly in the mornings, sometimes originating from the bronchial area. He has a routine of moderate exercises, including wall pushups and using a stationary bike, but has been less active recently due to weakness. He is concerned about traveling due to potential oxygen  depletion but manages with portable oxygen  equipment.  Past Medical History:  Diagnosis Date   Alcohol  abuse, daily use 08/24/2010   Stopped 2015    Aortic stenosis    Mild with mean aortic valve gradient 12 mmHg by echo 07/2023   Arthralgia 12/13/2013   9/15 Not related to statins OA    Ascending aorta dilatation    41 mm by echo 07/2023   Cataract    Chronic lymphocytic leukemia (HCC) 07/18/2014   chronic stage 1- no symtoms   CLL (chronic lymphocytic leukemia) (HCC) 08/08/2014   2016 Dr Lanny Stage 0   COPD mixed type (HCC)  07/26/2007   Smoker - stopped 6/14    De Quervain's tenosynovitis, right 12-25-13   2015    Depression    at times   Dysrhythmia    PAF   Dysuria 12/25/13   9/15 - poss stricture Urol ref was offered    Gallstones 11/16/2017   Asymptomatic Pt refused surg ref   Generalized anxiety disorder 09/07/2012   Chronic    Potential benefits of a long term steroid  use as well as potential risks  and complications were explained to the patient and were aknowledged.      GERD 12/02/2006   Chronic      Grade I diastolic dysfunction 01/20/2022   Grief May 26, 2016   [name] died in 01-14-2015   Gynecomastia 12/25/13   Benign B 2015    Hyperlipidemia    Hypertension    Hypothyroidism 12/25/2014   2015-01-14 On Levothyroxine     Intertrigo 02/02/2012   11/13    Neoplasm of uncertain behavior of skin 03/12/2013   12/14 R ear, chest    OSA on CPAP    mild with AHI 9/hr and oxygen  desats as low as 75%   Paresis (HCC)    right- s/p cerv decompression   PERIORBITAL CELLULITIS 02/22/2009   Qualifier: Diagnosis of  By: Avelina MD, Amy     Retinal detachment    L>>R     Family History  Problem Relation Age of Onset   COPD Mother    Diabetes Father    Cancer Paternal Aunt        breast cancer   Breast cancer Paternal Aunt 56   Coronary artery disease Other    Colon cancer Neg Hx      Social History   Socioeconomic History   Marital status: Widowed    Spouse name: Not on file   Number of children: 1   Years of education: Not on file   Highest education level: Not on file  Occupational History   Occupation: retired  Tobacco Use   Smoking status: Former    Current packs/day: 0.00    Average packs/day: 0.8 packs/day for 50.0 years (40.0 ttl pk-yrs)    Types: Cigarettes    Start date: 08/28/1962    Quit date: 08/27/2012    Years since quitting: 11.3   Smokeless tobacco: Never  Vaping Use   Vaping status: Never Used  Substance and Sexual Activity   Alcohol  use: Not Currently   Drug use: No   Sexual activity: Not Currently  Other Topics Concern   Not on file  Social History Narrative   Lives alone   Social Drivers of Health   Financial Resource Strain: Low Risk  (12/16/2022)   Overall Financial Resource Strain (CARDIA)    Difficulty of Paying Living Expenses: Not hard at all  Food Insecurity: No Food  Insecurity (04/20/2023)   Hunger Vital Sign    Worried About Running Out of Food in the Last Year: Never true    Ran Out of Food in the Last Year: Never true  Transportation Needs: No Transportation Needs (12/16/2022)   PRAPARE - Administrator, Civil Service (Medical): No    Lack of Transportation (Non-Medical): No  Physical Activity: Inactive (12/16/2022)   Exercise Vital Sign    Days of Exercise per Week: 0 days    Minutes of Exercise per Session: 0 min  Stress: No Stress Concern Present (12/16/2022)   [employer] of Occupational Health - Occupational Stress Questionnaire  Feeling of Stress : Not at all  Social Connections: Moderately Integrated (12/16/2022)   Social Connection and Isolation Panel    Frequency of Communication with Friends and Family: More than three times a week    Frequency of Social Gatherings with Friends and Family: More than three times a week    Attends Religious Services: More than 4 times per year    Active Member of [employer] or Organizations: Yes    Attends Banker Meetings: 1 to 4 times per year    Marital Status: Widowed  Intimate Partner Violence: Not At Risk (04/20/2023)   Humiliation, Afraid, Rape, and Kick questionnaire    Fear of Current or Ex-Partner: No    Emotionally Abused: No    Physically Abused: No    Sexually Abused: No     No Known Allergies   Outpatient Medications Prior to Visit  Medication Sig Dispense Refill   albuterol  (PROVENTIL ) (2.5 MG/3ML) 0.083% nebulizer solution Take 3 mLs (2.5 mg total) by nebulization every 6 (six) hours as needed for wheezing or shortness of breath. 75 mL 12   amLODipine  (NORVASC ) 5 MG tablet TAKE 1 TABLET BY MOUTH EVERY DAY 90 tablet 1   atenolol  (TENORMIN ) 25 MG tablet TAKE 1 TABLET(25 MG) BY MOUTH THREE TIMES DAILY AS NEEDED FOR PALPITATIONS 270 tablet 3   Cholecalciferol  25 MCG (1000 UT) tablet Take 1,000 Units by mouth daily.     diazepam  (VALIUM ) 5 MG tablet Take 1 tablet  (5 mg total) by mouth every 12 (twelve) hours as needed for anxiety. 180 tablet 1   ELIQUIS  5 MG TABS tablet TAKE 1 TABLET(5 MG) BY MOUTH TWICE DAILY 60 tablet 11   levothyroxine  (SYNTHROID ) 137 MCG tablet TAKE 1 TABLET(137 MCG) BY MOUTH DAILY BEFORE BREAKFAST 30 tablet 2   lovastatin  (MEVACOR ) 20 MG tablet TAKE 1 TABLET BY MOUTH EVERY NIGHT AT BEDTIME 90 tablet 2   Multiple Vitamin (MULTIVITAMIN) tablet Take 1 tablet by mouth daily. Centrum Silver.     Polyethyl Glycol-Propyl Glycol (SYSTANE ULTRA OP) Place 1 drop into both eyes 2 (two) times daily as needed (dry eyes).     No facility-administered medications prior to visit.   Review of Systems  Constitutional:  Negative for chills, fever, malaise/fatigue and weight loss.  HENT:  Negative for congestion, sinus pain and sore throat.   Eyes: Negative.   Respiratory:  Positive for shortness of breath. Negative for cough, hemoptysis, sputum production and wheezing.   Cardiovascular:  Negative for chest pain, palpitations, orthopnea, claudication and leg swelling.  Gastrointestinal:  Negative for abdominal pain, heartburn, nausea and vomiting.  Genitourinary: Negative.   Musculoskeletal:  Negative for joint pain and myalgias.  Skin:  Negative for rash.  Neurological:  Negative for weakness.  Endo/Heme/Allergies: Negative.   Psychiatric/Behavioral: Negative.     Objective:   Vitals:   12/26/23 1309  BP: (!) 147/77  Pulse: 71  SpO2: 95%  Weight: 180 lb (81.6 kg)  Height: 5' 11 (1.803 m)  PF: (!) 2 L/min    Physical Exam Constitutional:      General: He is not in acute distress.    Appearance: Normal appearance.  Eyes:     General: No scleral icterus.    Conjunctiva/sclera: Conjunctivae normal.  Cardiovascular:     Rate and Rhythm: Normal rate and regular rhythm.  Pulmonary:     Breath sounds: No wheezing, rhonchi or rales.  Musculoskeletal:     Right lower leg: No edema.  Left lower leg: No edema.  Skin:    General:  Skin is warm and dry.  Neurological:     General: No focal deficit present.    CBC    Component Value Date/Time   WBC 12.3 (H) 11/29/2023 1220   WBC 11.2 (H) 07/06/2022 1356   RBC 5.29 11/29/2023 1220   HGB 14.7 11/29/2023 1220   HGB 17.2 (H) 03/10/2017 0911   HCT 45.6 11/29/2023 1220   HCT 52.5 (H) 03/10/2017 0911   PLT 347 11/29/2023 1220   PLT 241 03/10/2017 0911   MCV 86.2 11/29/2023 1220   MCV 96.7 03/10/2017 0911   MCH 27.8 11/29/2023 1220   MCHC 32.2 11/29/2023 1220   RDW 13.3 11/29/2023 1220   RDW 13.6 03/10/2017 0911   LYMPHSABS 1.9 11/29/2023 1220   LYMPHSABS 9.8 (H) 03/10/2017 0911   MONOABS 1.1 (H) 11/29/2023 1220   MONOABS 1.0 (H) 03/10/2017 0911   EOSABS 0.1 11/29/2023 1220   EOSABS 0.3 03/10/2017 0911   BASOSABS 0.0 11/29/2023 1220   BASOSABS 0.1 03/10/2017 0911      Latest Ref Rng & Units 11/29/2023   12:20 PM 07/25/2023   10:47 AM 04/07/2023    2:15 PM  BMP  Glucose 70 - 99 mg/dL 92  896  89   BUN 8 - 23 mg/dL 6  8  9    Creatinine 0.61 - 1.24 mg/dL 9.19  9.11  9.08   Sodium 135 - 145 mmol/L 139  139  140   Potassium 3.5 - 5.1 mmol/L 4.2  4.2  4.4   Chloride 98 - 111 mmol/L 102  103  103   CO2 22 - 32 mmol/L 33  32  34   Calcium 8.9 - 10.3 mg/dL 9.8  9.8  89.8    Chest imaging: CT Chest 11/29/23 1. Interval increase in size and solid character of a treated mass in the anterior left upper lobe, with increased bronchial obstruction, highly concerning for progressing malignancy. PET-CT may be helpful to assess for metabolically active disease in this vicinity. 2. Interval increase in size and solid character of adjacent spiculated nodules in the peripheral right upper lobe superiorly measuring 2.5 x 1.9 cm, previously much more amorphous and less solid, measuring 2.3 x 2.1 cm, inferiorly measuring 1.6 x 1.3 cm, previously 1.2 x 1.2 cm. Findings are consistent with progressive metachronous or metastatic malignancy. 3. Unchanged prominent subcarinal  lymph node, nonspecific. 4. Unchanged nonspecific nodule in the anterior inferior right upper lobe measuring 0.6 cm. 5. Severe emphysema and diffuse bilateral bronchial wall thickening. 6. Coronary artery disease. 7. Hepatic steatosis. Coarse contour of the liver, suggestive of cirrhosis. 8. Nonobstructive right nephrolithiasis.  CT PET 03/07/23 Radiation changes in the left upper lobe. Underlying 3.7 cm nodular opacity with hypermetabolism, suggesting residual tumor/recurrence.   17 mm irregular nodule in the subpleural right upper lobe, suggesting metastatic disease.   No evidence of metastatic disease in the abdomen/pelvis.   Status post percutaneous cholecystostomy.  CT Chest 02/23/23 Mediastinum/Nodes: No suspicious mediastinal lymphadenopathy.   Lungs/Pleura: Radiation changes with volume loss in the left upper lobe.   15 x 9 mm subpleural nodular opacity in the lateral right upper lobe, previously 12 x 8 mm. Additional 5 mm triangular subpleural nodule in the right upper lobe (series 6/image 31), unchanged, likely reflecting a benign subpleural lymph node.   Moderate centrilobular and paraseptal emphysematous changes, upper lung predominant.   Mild subpleural reticulation/fibrosis, lower lobe predominant.   No focal consolidation.  No pleural effusion or pneumothorax.  PFT:    Latest Ref Rng & Units 05/05/2021   11:48 AM 01/02/2020    1:47 PM  PFT Results  FVC-Pre L 2.44  2.61   FVC-Predicted Pre % 55  58   FVC-Post L 2.75  3.13   FVC-Predicted Post % 62  69   Pre FEV1/FVC % % 52  54   Post FEV1/FCV % % 52  53   FEV1-Pre L 1.27  1.41   FEV1-Predicted Pre % 39  43   FEV1-Post L 1.42  1.66   DLCO uncorrected ml/min/mmHg 13.56  16.70   DLCO UNC% % 52  63   DLCO corrected ml/min/mmHg 13.56  16.56   DLCO COR %Predicted % 52  63   DLVA Predicted % 77  84   TLC L 6.02  6.87   TLC % Predicted % 83  94   RV % Predicted % 119  153      Labs:  Path:  Echo:  Heart Catheterization:    Assessment & Plan:   COPD, group B, by GOLD 2017 classification (HCC)  Chronic hypoxemic respiratory failure (HCC)  Discussion:  COPD - Use albuterol  as needed for dyspnea - Consider Stiolto inhaler if symptoms worsen. - consider pulmonary rehab  Chronic Hypoxemic respiratory Failure - continue supplemental oxygen  for goal SpO2 88% or higher - instructed patient to use his POC for longer trips and make sure he has back up oxygen  tanks in the care he can use if needed for extended travels to see friends  Non-Small Cell Lung cancer, RUL - Continue follow-up with oncologist  - plan for repeat CT Chest scan to monitor RUL lung nodules that have recently increased in size  Obstructive sleep apnea - continue CPAP  Follow up in 3 months  35 minutes spent on this visit  Dorn Chill, MD [facility] Office: [phone]   Current Outpatient Medications:    albuterol  (PROVENTIL ) (2.5 MG/3ML) 0.083% nebulizer solution, Take 3 mLs (2.5 mg total) by nebulization every 6 (six) hours as needed for wheezing or shortness of breath., Disp: 75 mL, Rfl: 12   amLODipine  (NORVASC ) 5 MG tablet, TAKE 1 TABLET BY MOUTH EVERY DAY, Disp: 90 tablet, Rfl: 1   atenolol  (TENORMIN ) 25 MG tablet, TAKE 1 TABLET(25 MG) BY MOUTH THREE TIMES DAILY AS NEEDED FOR PALPITATIONS, Disp: 270 tablet, Rfl: 3   Cholecalciferol  25 MCG (1000 UT) tablet, Take 1,000 Units by mouth daily., Disp: , Rfl:    diazepam  (VALIUM ) 5 MG tablet, Take 1 tablet (5 mg total) by mouth every 12 (twelve) hours as needed for anxiety., Disp: 180 tablet, Rfl: 1   ELIQUIS  5 MG TABS tablet, TAKE 1 TABLET(5 MG) BY MOUTH TWICE DAILY, Disp: 60 tablet, Rfl: 11   levothyroxine  (SYNTHROID ) 137 MCG tablet, TAKE 1 TABLET(137 MCG) BY MOUTH DAILY BEFORE BREAKFAST, Disp: 30 tablet, Rfl: 2   lovastatin  (MEVACOR ) 20 MG tablet, TAKE 1 TABLET BY MOUTH EVERY NIGHT AT BEDTIME,  Disp: 90 tablet, Rfl: 2   Multiple Vitamin (MULTIVITAMIN) tablet, Take 1 tablet by mouth daily. Centrum Silver., Disp: , Rfl:    Polyethyl Glycol-Propyl Glycol (SYSTANE ULTRA OP), Place 1 drop into both eyes 2 (two) times daily as needed (dry eyes)., Disp: , Rfl:

## 2023-12-26 NOTE — Patient Instructions (Addendum)
 Continue albuterol  nebulizer treatments as needed, concsider prior to taking the trash out  Follow up in December after your updated CT scan with Dr. Gatha

## 2024-01-03 DIAGNOSIS — I480 Paroxysmal atrial fibrillation: Secondary | ICD-10-CM

## 2024-01-04 ENCOUNTER — Ambulatory Visit (INDEPENDENT_AMBULATORY_CARE_PROVIDER_SITE_OTHER)

## 2024-01-04 DIAGNOSIS — Z23 Encounter for immunization: Secondary | ICD-10-CM | POA: Diagnosis not present

## 2024-01-04 NOTE — Progress Notes (Cosign Needed Addendum)
 After obtaining consent, and per orders of Dr. Garald, injection of High Dose Flu given by Edsel CHRISTELLA Kerns. Patient tolerated well.  Medical screening examination/treatment/procedure(s) were performed by non-physician practitioner and as supervising physician I was immediately available for consultation/collaboration.  I agree with above. Karlynn Garald, MD

## 2024-01-04 NOTE — Patient Instructions (Signed)
lot

## 2024-01-06 NOTE — Telephone Encounter (Signed)
 Call to patient to discuss heart monitor results, 2 identifiers used. Patient verbalizes understanding heart monitor showed normal sinus rhythm with average heart rate 61 bpm.  There were multiple episodes of atrial tachycardia lasting as long as 13 beats.  There was some intermittent atrial fibrillation as well but A-fib burden less than 1% which did correspond to patient's symptoms of palpitations.  He did have some extra heartbeats from the bottom of the heart called PVCs.  Patient states he rarely uses atenolol  and does not experience palpitations very often.

## 2024-01-06 NOTE — Telephone Encounter (Signed)
-----   Message from Wilbert Bihari sent at 01/05/2024 10:39 AM EDT ----- Heart monitor showed normal sinus rhythm with average heart rate 61 bpm.  There were multiple episodes of atrial tachycardia lasting as long as 13 beats.  There was some intermittent atrial  fibrillation as well but A-fib burden less than 1% which did correspond to patient's symptoms of palpitations.  He did have some extra heartbeats from the bottom of the heart called PVCs.  Please  find out if he is currently taking atenolol  25 mg 3 times daily every day or just as needed for palpitations.  TSH was normal at 1.4 on 07/25/2023 and K+ 4.2 on 11/29/2023 ----- Message ----- From: Bihari Wilbert SAUNDERS, MD Sent: 01/03/2024   7:12 PM EDT To: Wilbert SAUNDERS Bihari, MD

## 2024-01-31 NOTE — Telephone Encounter (Signed)
-----   Message from Wilbert Bihari sent at 01/09/2024  8:17 AM EDT ----- Continue current therapy ----- Message ----- From: Janit Geni CROME, RN Sent: 01/06/2024  12:46 PM EDT To: Wilbert JONELLE Bihari, MD  ----- Message from Geni CROME Janit, RN sent at 01/06/2024 12:46 PM EDT -----  Patient responses ----- Message ----- From: Bihari Wilbert JONELLE, MD Sent: 01/05/2024  10:39 AM EDT To: Geni CROME Janit, RN  Heart monitor showed normal sinus rhythm with average heart rate 61 bpm.  There were multiple episodes of atrial tachycardia lasting as long as 13 beats.  There was some intermittent atrial  fibrillation as well but A-fib burden less than 1% which did correspond to patient's symptoms of palpitations.  He did have some extra heartbeats from the bottom of the heart called PVCs.  Please  find out if he is currently taking atenolol  25 mg 3 times daily every day or just as needed for palpitations.  TSH was normal at 1.4 on 07/25/2023 and K+ 4.2 on 11/29/2023 ----- Message ----- From: Bihari Wilbert JONELLE, MD Sent: 01/03/2024   7:12 PM EDT To: Wilbert JONELLE Bihari, MD

## 2024-01-31 NOTE — Telephone Encounter (Signed)
 Reviewed with patient that Dr. Shlomo states to continue current medications, patient verbalizes understanding.

## 2024-02-06 ENCOUNTER — Other Ambulatory Visit: Payer: Self-pay | Admitting: Internal Medicine

## 2024-02-27 ENCOUNTER — Inpatient Hospital Stay: Attending: Internal Medicine

## 2024-02-27 ENCOUNTER — Ambulatory Visit (HOSPITAL_COMMUNITY)

## 2024-02-29 ENCOUNTER — Other Ambulatory Visit: Payer: Self-pay | Admitting: Physician Assistant

## 2024-02-29 DIAGNOSIS — E039 Hypothyroidism, unspecified: Secondary | ICD-10-CM

## 2024-03-02 ENCOUNTER — Ambulatory Visit

## 2024-03-02 VITALS — BP 120/78 | HR 78 | Ht 70.5 in | Wt 180.0 lb

## 2024-03-02 DIAGNOSIS — Z0000 Encounter for general adult medical examination without abnormal findings: Secondary | ICD-10-CM

## 2024-03-02 NOTE — Patient Instructions (Addendum)
 [name],  Thank you for taking the time for your Medicare Wellness Visit. I appreciate your continued commitment to your health goals. Please review the care plan we discussed, and feel free to reach out if I can assist you further.  Please note that Annual Wellness Visits do not include a physical exam. Some assessments may be limited, especially if the visit was conducted virtually. If needed, we may recommend an in-person follow-up with your provider.  Ongoing Care Seeing your primary care provider every 3 to 6 months helps us  monitor your health and provide consistent, personalized care.   Referrals If a referral was made during today's visit and you haven't received any updates within two weeks, please contact the referred provider directly to check on the status.  Recommended Screenings:  Health Maintenance  Topic Date Due   Colon Cancer Screening  09/12/2019   COVID-19 Vaccine (5 - 2025-26 season) 11/21/2023   Medicare Annual Wellness Visit  03/02/2025   DTaP/Tdap/Td vaccine (3 - Td or Tdap) 11/26/2029   Pneumococcal Vaccine for age over 16  Completed   Flu Shot  Completed   Hepatitis C Screening  Completed   Meningitis B Vaccine  Aged Out   Zoster (Shingles) Vaccine  Discontinued       03/02/2024   10:56 AM  Advanced Directives  Does Patient Have a Medical Advance Directive? Yes  Type of Estate Agent of [place];Living will  Copy of Healthcare Power of Attorney in Chart? No - copy requested    Vision: Annual vision screenings are recommended for early detection of glaucoma, cataracts, and diabetic retinopathy. These exams can also reveal signs of chronic conditions such as diabetes and high blood pressure.  Dental: Annual dental screenings help detect early signs of oral cancer, gum disease, and other conditions linked to overall health, including heart disease and diabetes.

## 2024-03-02 NOTE — Progress Notes (Signed)
 Chief Complaint  Patient presents with   Medicare Wellness     Subjective:   Joe [name] is a 78 y.o. male who presents for a Medicare Annual Wellness Visit.  Visit info / Clinical Intake: Medicare Wellness Visit Type:: Subsequent Annual Wellness Visit Persons participating in visit and providing information:: patient Medicare Wellness Visit Mode:: Telephone If telephone:: video declined Since this visit was completed virtually, some vitals may be partially provided or unavailable. Missing vitals are due to the limitations of the virtual format.: Documented vitals are patient reported If Telephone or Video please confirm:: I connected with patient using audio/video enable telemedicine. I verified patient identity with two identifiers, discussed telehealth limitations, and patient agreed to proceed. Patient Location:: Home Provider Location:: Office Interpreter Needed?: No Pre-visit prep was completed: yes AWV questionnaire completed by patient prior to visit?: no Living arrangements:: (!) lives alone Patient's Overall Health Status Rating: (!) fair Typical amount of pain: none Does pain affect daily life?: no Are you currently prescribed opioids?: no  Dietary Habits and Nutritional Risks How many meals a day?: (!) 1 (1-2) Eats fruit and vegetables daily?: yes Most meals are obtained by: preparing own meals In the last 2 weeks, have you had any of the following?: none Diabetic:: no  Functional Status Activities of Daily Living (to include ambulation/medication): Independent Ambulation: Independent with device- listed below Home Assistive Devices/Equipment: Eyeglasses; Oxygen ; Cane; CPAP; Nebulizers (oxygen  2liters) Medication Administration: Independent Home Management (perform basic housework or laundry): Independent Manage your own finances?: yes Primary transportation is: driving Concerns about vision?: no *vision screening is required for WTM* Concerns about  hearing?: no  Fall Screening Falls in the past year?: 0 Number of falls in past year: 0 Was there an injury with Fall?: 0 Fall Risk Category Calculator: 0 Patient Fall Risk Level: Low Fall Risk  Fall Risk Patient at Risk for Falls Due to: No Fall Risks Fall risk Follow up: Falls evaluation completed; Falls prevention discussed  Home and Transportation Safety: All rugs have non-skid backing?: N/A, no rugs All stairs or steps have railings?: yes (outside) Grab bars in the bathtub or shower?: yes Have non-skid surface in bathtub or shower?: yes Good home lighting?: yes Regular seat belt use?: yes Hospital stays in the last year:: no  Cognitive Assessment Difficulty concentrating, remembering, or making decisions? : no Will 6CIT or Mini Cog be Completed: yes What year is it?: 0 points What month is it?: 0 points Give patient an address phrase to remember (5 components): [address] [place], [place] About what time is it?: 0 points Count backwards from 20 to 1: 0 points Say the months of the year in reverse: 0 points Repeat the address phrase from earlier: 0 points 6 CIT Score: 0 points  Advance Directives (For Healthcare) Does Patient Have a Medical Advance Directive?: Yes Does patient want to make changes to medical advance directive?: No - Patient declined Type of Advance Directive: Healthcare Power of [place]; Living will Copy of Healthcare Power of Attorney in Chart?: No - copy requested Copy of Living Will in Chart?: No - copy requested  Reviewed/Updated  Reviewed/Updated: Reviewed All (Medical, Surgical, Family, Medications, Allergies, Care Teams, Patient Goals)    Allergies (verified) Patient has no known allergies.   Current Medications (verified) Outpatient Encounter Medications as of 03/02/2024  Medication Sig   albuterol  (PROVENTIL ) (2.5 MG/3ML) 0.083% nebulizer solution Take 3 mLs (2.5 mg total) by nebulization every 6 (six) hours as needed for wheezing or  shortness of breath.   amLODipine  (NORVASC ) 5 MG tablet TAKE 1 TABLET BY MOUTH EVERY DAY   atenolol  (TENORMIN ) 25 MG tablet TAKE 1 TABLET(25 MG) BY MOUTH THREE TIMES DAILY AS NEEDED FOR PALPITATIONS   Cholecalciferol  25 MCG (1000 UT) tablet Take 1,000 Units by mouth daily.   diazepam  (VALIUM ) 5 MG tablet Take 1 tablet (5 mg total) by mouth every 12 (twelve) hours as needed for anxiety.   ELIQUIS  5 MG TABS tablet TAKE 1 TABLET(5 MG) BY MOUTH TWICE DAILY   levothyroxine  (SYNTHROID ) 137 MCG tablet TAKE 1 TABLET(137 MCG) BY MOUTH DAILY BEFORE BREAKFAST   lovastatin  (MEVACOR ) 20 MG tablet TAKE 1 TABLET BY MOUTH EVERY NIGHT AT BEDTIME   Multiple Vitamin (MULTIVITAMIN) tablet Take 1 tablet by mouth daily. Centrum Silver.   Polyethyl Glycol-Propyl Glycol (SYSTANE ULTRA OP) Place 1 drop into both eyes 2 (two) times daily as needed (dry eyes).   No facility-administered encounter medications on file as of 03/02/2024.    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   Past Surgical History:  Procedure Laterality Date   BICEPS TENDON REPAIR     BRONCHIAL BIOPSY  10/23/2019   Procedure: BRONCHIAL BIOPSIES;  Surgeon: Shelah Lamar RAMAN, MD;  Location: [facility] ENDOSCOPY;  Service: Pulmonary;;   BRONCHIAL BIOPSY  11/20/2019   Procedure: BRONCHIAL BIOPSIES;  Surgeon: Shelah Lamar RAMAN, MD;  Location: [facility] ENDOSCOPY;  Service: Pulmonary;;   BRONCHIAL BRUSHINGS  10/23/2019   Procedure: BRONCHIAL BRUSHINGS;  Surgeon: Shelah Lamar RAMAN, MD;  Location: [facility] ENDOSCOPY;  Service: Pulmonary;;   BRONCHIAL BRUSHINGS  11/20/2019   Procedure: BRONCHIAL BRUSHINGS;  Surgeon: Shelah Lamar RAMAN, MD;  Location: [facility] ENDOSCOPY;  Service: Pulmonary;;   BRONCHIAL NEEDLE ASPIRATION BIOPSY  10/23/2019   Procedure: BRONCHIAL NEEDLE ASPIRATION BIOPSIES;  Surgeon: Shelah Lamar RAMAN, MD;  Location: [facility] ENDOSCOPY;  Service: Pulmonary;;   BRONCHIAL NEEDLE ASPIRATION BIOPSY  11/20/2019   Procedure: BRONCHIAL NEEDLE ASPIRATION BIOPSIES;  Surgeon: Shelah Lamar RAMAN, MD;  Location: [facility] ENDOSCOPY;  Service: Pulmonary;;   BRONCHIAL WASHINGS  11/20/2019   Procedure: BRONCHIAL WASHINGS;  Surgeon: Shelah Lamar RAMAN, MD;  Location: [facility] ENDOSCOPY;  Service: Pulmonary;;   CATARACT EXTRACTION Left    COLONOSCOPY  04-14-99   Dr Margaretann polyp-TA in epic   IR BALLOON DILATION OF BILIARY DUCTS/AMPULLA  05/04/2022   IR CONVERT BILIARY DRAIN TO INT EXT BILIARY DRAIN  05/04/2022   IR  CONVERT BILIARY DRAIN TO INT EXT BILIARY DRAIN  05/27/2022   IR EXCHANGE BILIARY DRAIN  02/02/2022   IR EXCHANGE BILIARY DRAIN  03/19/2022   IR EXCHANGE BILIARY DRAIN  07/13/2022   IR EXCHANGE BILIARY DRAIN  09/07/2022   IR EXCHANGE BILIARY DRAIN  11/30/2022   IR EXCHANGE BILIARY DRAIN  02/22/2023   IR EXCHANGE BILIARY DRAIN  05/24/2023   IR EXCHANGE BILIARY DRAIN  08/16/2023   IR PERC CHOLECYSTOSTOMY  01/21/2022   IR RADIOLOGIST EVAL & MGMT  04/08/2022   IR RADIOLOGIST EVAL & MGMT  05/17/2022   IR RADIOLOGIST EVAL & MGMT  09/13/2023   IR RADIOLOGIST EVAL & MGMT  09/20/2023   IR REMOVAL OF CALCULI/DEBRIS BILIARY DUCT/GB  05/04/2022   POLYPECTOMY  04-14-99   POSTERIOR LAMINECTOMY / DECOMPRESSION CERVICAL SPINE     Dr Onetha   RETINAL DETACHMENT SURGERY     left eye, 2007 x2, 2008 x 3   ROTATOR CUFF REPAIR  2004   right   TONSILLECTOMY  8048,8036   VIDEO BRONCHOSCOPY WITH ENDOBRONCHIAL NAVIGATION N/A 10/23/2019   Procedure: VIDEO BRONCHOSCOPY WITH ENDOBRONCHIAL NAVIGATION;  Surgeon: Shelah Lamar RAMAN, MD;  Location: [facility] ENDOSCOPY;  Service: Pulmonary;  Laterality: N/A;   VIDEO BRONCHOSCOPY WITH ENDOBRONCHIAL NAVIGATION N/A 11/20/2019   Procedure: VIDEO BRONCHOSCOPY WITH ENDOBRONCHIAL NAVIGATION;  Surgeon: Shelah Lamar RAMAN, MD;  Location: [facility] ENDOSCOPY;  Service: Pulmonary;  Laterality: N/A;   VIDEO BRONCHOSCOPY WITH ENDOBRONCHIAL ULTRASOUND N/A 10/23/2019   Procedure: VIDEO BRONCHOSCOPY WITH ENDOBRONCHIAL ULTRASOUND;  Surgeon: Shelah Lamar RAMAN, MD;  Location: [facility] ENDOSCOPY;  Service: Pulmonary;  Laterality: N/A;   Family History  Problem Relation Age of Onset   COPD Mother    Diabetes Father    Cancer Paternal Aunt        breast cancer   Breast cancer Paternal Aunt 86   Coronary artery disease Other    Colon cancer Neg Hx    Social History   Occupational History   Occupation: retired  Tobacco Use   Smoking status: Former    Current packs/day: 0.00    Average packs/day: 0.8 packs/day for 50.0 years  (40.0 ttl pk-yrs)    Types: Cigarettes    Start date: 08/28/1962    Quit date: 08/27/2012    Years since quitting: 11.5   Smokeless tobacco: Never  Vaping Use   Vaping status: Never Used  Substance and Sexual Activity   Alcohol  use: Not Currently   Drug use: No   Sexual activity: Not Currently   Tobacco Counseling Counseling given: Not Answered  SDOH Screenings   Food Insecurity: No Food Insecurity (03/02/2024)  Housing: Unknown (03/02/2024)  Transportation  Needs: No Transportation Needs (03/02/2024)  Utilities: Not At Risk (03/02/2024)  Alcohol  Screen: Low Risk (12/16/2022)  Depression (PHQ2-9): Low Risk (03/02/2024)  Financial Resource Strain: Low Risk (12/16/2022)  Physical Activity: Insufficiently Active (03/02/2024)  Social Connections: Moderately Integrated (03/02/2024)  Stress: No Stress Concern Present (03/02/2024)  Tobacco Use: Medium Risk (03/02/2024)  Health Literacy: Adequate Health Literacy (03/02/2024)   See flowsheets for full screening details  Depression Screen PHQ 2 & 9 Depression Scale- Over the past 2 weeks, how often have you been bothered by any of the following problems? Little interest or pleasure in doing things: 0 Feeling down, depressed, or hopeless (PHQ Adolescent also includes...irritable): 0 PHQ-2 Total Score: 0 Trouble falling or staying asleep, or sleeping too much: 0 Feeling tired or having little energy: 1 Poor appetite or overeating (PHQ Adolescent also includes...weight loss): 1 Feeling bad about yourself - or that you are a failure or have let yourself or your family down: 0 Trouble concentrating on things, such as reading the newspaper or watching television (PHQ Adolescent also includes...like school work): 0 Moving or speaking so slowly that other people could have noticed. Or the opposite - being so fidgety or restless that you have been moving around a lot more than usual: 1 Thoughts that you would be better off dead, or of hurting  yourself in some way: 0 PHQ-9 Total Score: 3 If you checked off any problems, how difficult have these problems made it for you to do your work, take care of things at home, or get along with other people?: Not difficult at all  Depression Treatment Depression Interventions/Treatment : [id number] Score <4 Follow-up Not Indicated     Goals Addressed               This Visit's Progress     Patient Stated (pt-stated)        Patient stated he plans to continue manage his blood pressure readings and oxygen  levels             Objective:    Today's Vitals   03/02/24 1053  BP: 120/78  Pulse: 78  SpO2: 98%  Weight: 180 lb (81.6 kg)  Height: 5' 10.5 (1.791 m)   Body mass index is 25.46 kg/m.  Hearing/Vision screen Hearing Screening - Comments:: Denies hearing difficulties   Vision Screening - Comments:: Wears rx glasses - up to date with routine eye exams with Heather Oman Immunizations and Health Maintenance Health Maintenance  Topic Date Due   Colonoscopy  09/12/2019   COVID-19 Vaccine (5 - 2025-26 season) 11/21/2023   Medicare Annual Wellness (AWV)  03/02/2025   DTaP/Tdap/Td (3 - Td or Tdap) 11/26/2029   Pneumococcal Vaccine: 50+ Years  Completed   Influenza Vaccine  Completed   Hepatitis C Screening  Completed   Meningococcal B Vaccine  Aged Out   Zoster Vaccines- Shingrix  Discontinued        Assessment/Plan:  This is a routine wellness examination for [place].  Patient Care Team: [name], Karlynn GAILS, MD as PCP - General Lanny Callander, MD as Consulting Physician (Hematology) Shlomo Wilbert SAUNDERS, MD as Consulting Physician (Cardiology) Alvia Norleen BIRCH, MD as Consulting Physician (Ophthalmology) Evertt Lonell BROCKS, RN as Oncology Nurse Navigator Meier, Leonor HERO, MD as Consulting Physician (Pulmonary Disease) Oman, Heather, OD [facility])  I have personally reviewed and noted the following in the patients chart:   Medical and social history Use of alcohol ,  tobacco or illicit drugs  Current medications and supplements including opioid  prescriptions. Functional ability and status Nutritional status Physical activity Advanced directives List of other physicians Hospitalizations, surgeries, and ER visits in previous 12 months Vitals Screenings to include cognitive, depression, and falls Referrals and appointments  No orders of the defined types were placed in this encounter.  In addition, I have reviewed and discussed with patient certain preventive protocols, quality metrics, and best practice recommendations. A written personalized care plan for preventive services as well as general preventive health recommendations were provided to patient.   Verdie CHRISTELLA Saba, CMA   03/02/2024   Return in 1 year (on 03/02/2025).  After Visit Summary: (Declined) Due to this being a telephonic visit, with patients personalized plan was offered to patient but patient Declined AVS at this time   Nurse Notes: Appointment(s) made: (schedudled 2026 AWV/CPE appts)

## 2024-03-05 ENCOUNTER — Ambulatory Visit: Admitting: Physician Assistant

## 2024-03-06 DIAGNOSIS — H43813 Vitreous degeneration, bilateral: Secondary | ICD-10-CM | POA: Diagnosis not present

## 2024-03-07 ENCOUNTER — Inpatient Hospital Stay

## 2024-03-07 ENCOUNTER — Ambulatory Visit (HOSPITAL_COMMUNITY): Admission: RE | Admit: 2024-03-07 | Discharge: 2024-03-07 | Attending: Internal Medicine | Admitting: Internal Medicine

## 2024-03-07 DIAGNOSIS — C3490 Malignant neoplasm of unspecified part of unspecified bronchus or lung: Secondary | ICD-10-CM | POA: Diagnosis present

## 2024-03-07 DIAGNOSIS — R918 Other nonspecific abnormal finding of lung field: Secondary | ICD-10-CM | POA: Insufficient documentation

## 2024-03-07 DIAGNOSIS — I7121 Aneurysm of the ascending aorta, without rupture: Secondary | ICD-10-CM | POA: Insufficient documentation

## 2024-03-07 DIAGNOSIS — K8020 Calculus of gallbladder without cholecystitis without obstruction: Secondary | ICD-10-CM | POA: Diagnosis not present

## 2024-03-07 DIAGNOSIS — E039 Hypothyroidism, unspecified: Secondary | ICD-10-CM

## 2024-03-07 DIAGNOSIS — J432 Centrilobular emphysema: Secondary | ICD-10-CM | POA: Diagnosis not present

## 2024-03-07 DIAGNOSIS — K7460 Unspecified cirrhosis of liver: Secondary | ICD-10-CM | POA: Diagnosis not present

## 2024-03-07 DIAGNOSIS — N200 Calculus of kidney: Secondary | ICD-10-CM | POA: Diagnosis not present

## 2024-03-07 DIAGNOSIS — Z79899 Other long term (current) drug therapy: Secondary | ICD-10-CM | POA: Insufficient documentation

## 2024-03-07 LAB — CMP (CANCER CENTER ONLY)
ALT: 11 U/L (ref 0–44)
AST: 22 U/L (ref 15–41)
Albumin: 4 g/dL (ref 3.5–5.0)
Alkaline Phosphatase: 97 U/L (ref 38–126)
Anion gap: 7 (ref 5–15)
BUN: 9 mg/dL (ref 8–23)
CO2: 31 mmol/L (ref 22–32)
Calcium: 9.7 mg/dL (ref 8.9–10.3)
Chloride: 100 mmol/L (ref 98–111)
Creatinine: 0.92 mg/dL (ref 0.61–1.24)
GFR, Estimated: >60 mL/min (ref >60)
Glucose, Bld: 103 mg/dL — ABNORMAL HIGH (ref 70–99)
Potassium: 4.3 mmol/L (ref 3.5–5.1)
Sodium: 139 mmol/L (ref 135–145)
Total Bilirubin: 0.6 mg/dL (ref 0.0–1.2)
Total Protein: 7.8 g/dL (ref 6.5–8.1)

## 2024-03-07 LAB — CBC WITH DIFFERENTIAL (CANCER CENTER ONLY)
Abs Immature Granulocytes: 0.05 K/uL (ref 0.00–0.07)
Basophils Absolute: 0.1 K/uL (ref 0.0–0.1)
Basophils Relative: 1 %
Eosinophils Absolute: 0.2 K/uL (ref 0.0–0.5)
Eosinophils Relative: 1 %
HCT: 47.9 % (ref 39.0–52.0)
Hemoglobin: 15.1 g/dL (ref 13.0–17.0)
Immature Granulocytes: 0 %
Lymphocytes Relative: 15 %
Lymphs Abs: 2 K/uL (ref 0.7–4.0)
MCH: 27.1 pg (ref 26.0–34.0)
MCHC: 31.5 g/dL (ref 30.0–36.0)
MCV: 85.8 fL (ref 80.0–100.0)
Monocytes Absolute: 1.2 K/uL — ABNORMAL HIGH (ref 0.1–1.0)
Monocytes Relative: 9 %
Neutro Abs: 9.8 K/uL — ABNORMAL HIGH (ref 1.7–7.7)
Neutrophils Relative %: 74 %
Platelet Count: 363 K/uL (ref 150–400)
RBC: 5.58 MIL/uL (ref 4.22–5.81)
RDW: 13.5 % (ref 11.5–15.5)
WBC Count: 13.3 K/uL — ABNORMAL HIGH (ref 4.0–10.5)
nRBC: 0 % (ref 0.0–0.2)

## 2024-03-07 LAB — TSH: TSH: 0.638 u[IU]/mL (ref 0.350–4.500)

## 2024-03-07 LAB — T4, FREE: Free T4: 1.86 ng/dL (ref 0.80–2.00)

## 2024-03-07 MED ORDER — IOHEXOL 300 MG/ML  SOLN
75.0000 mL | Freq: Once | INTRAMUSCULAR | Status: AC | PRN
  Administered 2024-03-07: 13:00:00 75 mL via INTRAVENOUS

## 2024-03-23 NOTE — Progress Notes (Signed)
 [facility] OFFICE PROGRESS NOTE  [name], Karlynn GAILS, MD [address] [place] [address]  DIAGNOSIS: 1) Stage IIIb non-small cell lung cancer, squamous cell carcinoma.  The patient presented with a left upper lobe lung mass as well as associated mediastinal lymphadenopathy and contralateral right hilar mild hypermetabolism.  There was heterogeneous marrow uptake in the spine but with more focal areas at L4 without imaging correlation on CT scan.  The patient was diagnosed in August 2021. 2) CLL stage 0, diagnosed in 2016   PD-L1 expression: 2%  PRIOR THERAPY:  1) Concurrent chemoradiation with carboplatin  for an AUC of 2 and paclitaxel  45 mg per metered squared weekly.  First dose expected on 12/13/2019. Status post 5 cycles.   Last dose was giving January 14, 2020. 2) Consolidation treatment with immunotherapy with Imfinzi  1500 mg IV every 4 weeks.  First dose February 19, 2020.  Status post 13 cycles. 3) SBRT to the right subpleural lung nodule completed on 05/17/23  CURRENT THERAPY: Observation   INTERVAL HISTORY: Joe [name] 79 y.o. male returns to the clinic today for a follow-up visit.  The patient was last seen in clinic on 12/06/2023 by Dr. Sherrod.  The patient is currently on observation for his history of lung cancer.   He does have some enlarging lung nodules that Dr. Sherrod previously arrange for PET scan about 1 year ago.  The patient had radiation in February 2025. However, he still being monitored closely with CT scans every 3 months to assess for changes in the nodule size or density changes.   Respiratory symptoms have worsened during the winter, with increased dyspnea and intermittent cough, typically dry but occasionally productive, which he attributes to COPD and postnasal drip from CPAP use. He remains largely inactive due to cold weather and a prior rib bruise, spending most of his time indoors. He wears supplemental oxygen . He is scheduled to  see his pulmonologist later this month on 04/17/24.   He describes poor appetite, with estimated caloric intake less than 1,000 calories per day and decreased hunger. Diet consists primarily of microwave meals, sweets, and soft drinks. He maintains hydration with 84-115 ounces of water daily. He experiences daytime somnolence, sometimes falling asleep during the day despite sleeping 9-9.5 hours at night. He denies nausea, vomiting, or recent abdominal pain. Bowel habits are variable, with intermittent constipation and occasional diarrhea; stool color ranges from light clay to dark. He has longstanding hemorrhoids but no rectal bleeding.  He continues to manage atrial fibrillation, noting an increase in resting heart rate to 85-100 bpm since the onset of colder weather, compared to prior rates in the 60s. He monitors his heart rate with an oximeter and can detect arrhythmia episodes, which occasionally spike to 135-153 bpm and last up to a day. He takes apixaban  and atenolol  for rate control, with no recent medication changes and no chest pain or syncope.   He has abstained from alcohol  since 2015. His scans show cirrhosis. He has a collapsed, non-functioning gallbladder with retained gallstones, following unsuccessful attempts at removal due to high surgical risk and gallbladder collapse. He has no current signs of biliary infection or obstruction.  He denies any major changes in his health since he was last seen.  He also has chronic lymphocytic leukemia.  He denies fever. He denies hemoptysis. He denies nausea or vomiting. Denies any headache or visual changes. He recently had a restaging CT scan.  He is here today for evaluation  and to review his scan results.    MEDICAL HISTORY: Past Medical History:  Diagnosis Date   Alcohol  abuse, daily use 08/24/2010   Stopped 2015    Aortic stenosis    Mild with mean aortic valve gradient 12 mmHg by echo 07/2023   Arthralgia 12-26-13   9/15 Not related  to statins OA    Ascending aorta dilatation    41 mm by echo 07/2023   Cataract    Chronic lymphocytic leukemia (HCC) 07/18/2014   chronic stage 1- no symtoms   CLL (chronic lymphocytic leukemia) (HCC) 08/08/2014   04/15/14 Dr Lanny Stage 0   COPD mixed type (HCC) 07/26/2007   Smoker - stopped 6/14    De Quervain's tenosynovitis, right 2013-12-26   2015    Depression    at times   Dysrhythmia    PAF   Dysuria 12-26-2013   9/15 - poss stricture Urol ref was offered    Gallstones 11/16/2017   Asymptomatic Pt refused surg ref   Generalized anxiety disorder 09/07/2012   Chronic   Potential benefits of a long term steroid  use as well as potential risks  and complications were explained to the patient and were aknowledged.      GERD 12/02/2006   Chronic      Grade I diastolic dysfunction 01/20/2022   Grief 05/27/2016   [name] died in 04/15/2014   Gynecomastia 12-26-2013   Benign B 2015    Hyperlipidemia    Hypertension    Hypothyroidism 12/25/2014   April 15, 2014 On Levothyroxine     Intertrigo 02/02/2012   11/13    Neoplasm of uncertain behavior of skin 03/12/2013   12/14 R ear, chest    OSA on CPAP    mild with AHI 9/hr and oxygen  desats as low as 75%   Paresis (HCC)    right- s/p cerv decompression   PERIORBITAL CELLULITIS 02/22/2009   Qualifier: Diagnosis of  By: Avelina MD, Amy     Retinal detachment    L>>R    ALLERGIES:  has no known allergies.  MEDICATIONS:  Current Outpatient Medications  Medication Sig Dispense Refill   albuterol  (PROVENTIL ) (2.5 MG/3ML) 0.083% nebulizer solution Take 3 mLs (2.5 mg total) by nebulization every 6 (six) hours as needed for wheezing or shortness of breath. 75 mL 12   amLODipine  (NORVASC ) 5 MG tablet TAKE 1 TABLET BY MOUTH EVERY DAY 90 tablet 1   atenolol  (TENORMIN ) 25 MG tablet TAKE 1 TABLET(25 MG) BY MOUTH THREE TIMES DAILY AS NEEDED FOR PALPITATIONS 270 tablet 3   Cholecalciferol  25 MCG (1000 UT) tablet Take 1,000 Units by mouth daily.      diazepam  (VALIUM ) 5 MG tablet Take 1 tablet (5 mg total) by mouth every 12 (twelve) hours as needed for anxiety. 180 tablet 1   ELIQUIS  5 MG TABS tablet TAKE 1 TABLET(5 MG) BY MOUTH TWICE DAILY 60 tablet 11   levothyroxine  (SYNTHROID ) 137 MCG tablet TAKE 1 TABLET(137 MCG) BY MOUTH DAILY BEFORE BREAKFAST 30 tablet 2   lovastatin  (MEVACOR ) 20 MG tablet TAKE 1 TABLET BY MOUTH EVERY NIGHT AT BEDTIME 90 tablet 2   Multiple Vitamin (MULTIVITAMIN) tablet Take 1 tablet by mouth daily. Centrum Silver.     Polyethyl Glycol-Propyl Glycol (SYSTANE ULTRA OP) Place 1 drop into both eyes 2 (two) times daily as needed (dry eyes).     No current facility-administered medications for this visit.    SURGICAL HISTORY:  Past Surgical History:  Procedure Laterality Date  BICEPS TENDON REPAIR     BRONCHIAL BIOPSY  10/23/2019   Procedure: BRONCHIAL BIOPSIES;  Surgeon: Shelah Lamar RAMAN, MD;  Location: [facility] ENDOSCOPY;  Service: Pulmonary;;   BRONCHIAL BIOPSY  11/20/2019   Procedure: BRONCHIAL BIOPSIES;  Surgeon: Shelah Lamar RAMAN, MD;  Location: [facility] ENDOSCOPY;  Service: Pulmonary;;   BRONCHIAL BRUSHINGS  10/23/2019   Procedure: BRONCHIAL BRUSHINGS;  Surgeon: Shelah Lamar RAMAN, MD;  Location[facility] ENDOSCOPY;  Service: Pulmonary;;   BRONCHIAL BRUSHINGS  11/20/2019   Procedure: BRONCHIAL BRUSHINGS;  Surgeon: Shelah Lamar RAMAN, MD;  Location: [facility] ENDOSCOPY;  Service: Pulmonary;;   BRONCHIAL NEEDLE ASPIRATION BIOPSY  10/23/2019   Procedure: BRONCHIAL NEEDLE ASPIRATION BIOPSIES;  Surgeon: Shelah Lamar RAMAN, MD;  Location: [facility] ENDOSCOPY;  Service: Pulmonary;;   BRONCHIAL NEEDLE ASPIRATION BIOPSY  11/20/2019   Procedure: BRONCHIAL NEEDLE ASPIRATION BIOPSIES;  Surgeon: Shelah Lamar RAMAN, MD;  Location: [facility] ENDOSCOPY;  Service: Pulmonary;;   BRONCHIAL WASHINGS  11/20/2019   Procedure: BRONCHIAL WASHINGS;  Surgeon: Shelah Lamar RAMAN, MD;  Location: [facility] ENDOSCOPY;  Service: Pulmonary;;   CATARACT EXTRACTION Left    COLONOSCOPY  04-14-99   Dr Margaretann  polyp-TA in epic   IR BALLOON DILATION OF BILIARY DUCTS/AMPULLA  05/04/2022   IR CONVERT BILIARY DRAIN TO INT EXT BILIARY DRAIN  05/04/2022   IR CONVERT BILIARY DRAIN TO INT EXT BILIARY DRAIN  05/27/2022   IR EXCHANGE BILIARY DRAIN  02/02/2022   IR EXCHANGE BILIARY DRAIN  03/19/2022   IR EXCHANGE BILIARY DRAIN  07/13/2022   IR EXCHANGE BILIARY DRAIN  09/07/2022   IR EXCHANGE BILIARY DRAIN  11/30/2022   IR EXCHANGE BILIARY DRAIN  02/22/2023   IR EXCHANGE BILIARY DRAIN  05/24/2023   IR EXCHANGE BILIARY DRAIN  08/16/2023   IR PERC CHOLECYSTOSTOMY  01/21/2022   IR RADIOLOGIST EVAL & MGMT  04/08/2022   IR RADIOLOGIST EVAL & MGMT  05/17/2022   IR RADIOLOGIST EVAL & MGMT  09/13/2023   IR RADIOLOGIST EVAL & MGMT  09/20/2023   IR REMOVAL OF CALCULI/DEBRIS BILIARY DUCT/GB  05/04/2022   POLYPECTOMY  04-14-99   POSTERIOR LAMINECTOMY / DECOMPRESSION CERVICAL SPINE     Dr Onetha   RETINAL DETACHMENT SURGERY     left eye, 2007 x2, 2008 x 3   ROTATOR CUFF REPAIR  2004   right   TONSILLECTOMY  [id number]   VIDEO BRONCHOSCOPY WITH ENDOBRONCHIAL NAVIGATION N/A 10/23/2019   Procedure: VIDEO BRONCHOSCOPY WITH ENDOBRONCHIAL NAVIGATION;  Surgeon: Shelah Lamar RAMAN, MD;  Location: [facility] ENDOSCOPY;  Service: Pulmonary;  Laterality: N/A;   VIDEO BRONCHOSCOPY WITH ENDOBRONCHIAL NAVIGATION N/A 11/20/2019   Procedure: VIDEO BRONCHOSCOPY WITH ENDOBRONCHIAL NAVIGATION;  Surgeon: Shelah Lamar RAMAN, MD;  Location: [facility] ENDOSCOPY;  Service: Pulmonary;  Laterality: N/A;   VIDEO BRONCHOSCOPY WITH ENDOBRONCHIAL ULTRASOUND N/A 10/23/2019   Procedure: VIDEO BRONCHOSCOPY WITH ENDOBRONCHIAL ULTRASOUND;  Surgeon: Shelah Lamar RAMAN, MD;  Location: [facility] ENDOSCOPY;  Service: Pulmonary;  Laterality: N/A;    REVIEW OF SYSTEMS:   Review of Systems  Constitutional: Stable fatigue, appetite change, and weight loss. Negative for chills and fever.  HENT: Negative for mouth sores, nosebleeds, sore throat and trouble swallowing.   Eyes: Negative for eye problems and  icterus.  Respiratory: Positive for shortness of breath and cough. Negative for hemoptysis and wheezing.   Cardiovascular: Negative for chest pain and leg swelling.  Gastrointestinal: Intermittent constipation. Negative for abdominal pain, diarrhea, nausea and vomiting.  Genitourinary: Negative for bladder incontinence, difficulty urinating, dysuria, frequency and hematuria.   Musculoskeletal:  Negative for back pain, gait problem, neck pain and neck stiffness.  Skin: Negative for itching and rash.  Neurological: Negative for dizziness, extremity weakness, gait problem, headaches, light-headedness and seizures.  Hematological: Negative for adenopathy. Does not bruise/bleed easily.  Psychiatric/Behavioral: Negative for confusion, depression and sleep disturbance. The patient is not nervous/anxious.     PHYSICAL EXAMINATION:  Blood pressure 123/85, pulse 85, temperature (!) 97.3 F (36.3 C), resp. rate 18, height 5' 10.5 (1.791 m), weight 178 lb (80.7 kg), SpO2 97%.  ECOG PERFORMANCE STATUS: 1  Physical Exam  Constitutional: Oriented to person, place, and time and well-developed, well-nourished, and in no distress.  HENT:  Head: Normocephalic and atraumatic.  Mouth/Throat: Oropharynx is clear and moist. No oropharyngeal exudate.  Eyes: Conjunctivae are normal. Right eye exhibits no discharge. Left eye exhibits no discharge. No scleral icterus.  Neck: Normal range of motion. Neck supple.  Cardiovascular: Normal rate, regular rhythm, normal heart sounds and intact distal pulses.   Pulmonary/Chest: Effort normal. Quiet breath sounds bilaterally. No respiratory distress. No wheezes. No rales. Wearing supplemental oxygen .  Abdominal: Soft. Bowel sounds are normal. Exhibits no distension and no mass. There is no tenderness.  Musculoskeletal: Normal range of motion. Exhibits no edema.  Lymphadenopathy:    No cervical adenopathy.  Neurological: Alert and oriented to person, place, and time.  Exhibits normal muscle tone. Gait normal. Coordination normal.  Skin: Skin is warm and dry. No rash noted. Not diaphoretic. No erythema. No pallor.  Psychiatric: Mood, memory and judgment normal.  Vitals reviewed.  LABORATORY DATA: Lab Results  Component Value Date   WBC 13.3 (H) 03/07/2024   HGB 15.1 03/07/2024   HCT 47.9 03/07/2024   MCV 85.8 03/07/2024   PLT 363 03/07/2024      Chemistry      Component Value Date/Time   NA 139 03/07/2024 1129   NA 143 03/10/2017 0911   K 4.3 03/07/2024 1129   K 4.7 03/10/2017 0911   CL 100 03/07/2024 1129   CO2 31 03/07/2024 1129   CO2 30 (H) 03/10/2017 0911   BUN 9 03/07/2024 1129   BUN 10.7 03/10/2017 0911   CREATININE 0.92 03/07/2024 1129   CREATININE 0.8 03/10/2017 0911      Component Value Date/Time   CALCIUM 9.7 03/07/2024 1129   CALCIUM 9.4 03/10/2017 0911   ALKPHOS 97 03/07/2024 1129   ALKPHOS 76 03/10/2017 0911   AST 22 03/07/2024 1129   AST 34 03/10/2017 0911   ALT 11 03/07/2024 1129   ALT 45 03/10/2017 0911   BILITOT 0.6 03/07/2024 1129   BILITOT 1.41 (H) 03/10/2017 0911       RADIOGRAPHIC STUDIES:  CT Chest W Contrast Result Date: 03/15/2024 CLINICAL DATA:  Non-small cell lung cancer. Immunotherapy scratched radiation immunotherapy finished 2022. * Tracking Code: BO * EXAM: CT CHEST WITH CONTRAST TECHNIQUE: Multidetector CT imaging of the chest was performed during intravenous contrast administration. RADIATION DOSE REDUCTION: This exam was performed according to the departmental dose-optimization program which includes automated exposure control, adjustment of the mA and/or kV according to patient size and/or use of iterative reconstruction technique. CONTRAST:  75mL OMNIPAQUE  IOHEXOL  300 MG/ML  SOLN COMPARISON:  11/29/2023. FINDINGS: Cardiovascular: Atherosclerotic calcification of the aorta, aortic valve and coronary arteries. Ascending aorta measures 4.1 cm (coronal image 74). Heart is at the upper limits of normal  in size. No pericardial effusion. Mediastinum/Nodes: Thoracic inlet lymph nodes are not enlarged by CT size criteria. No pathologically enlarged mediastinal, hilar or axillary  lymph nodes. Bilateral gynecomastia. Esophagus is grossly unremarkable. Lungs/Pleura: Centrilobular and paraseptal emphysema. Nodular consolidation in the lateral right upper lobe (8/40), stable from 11/29/2023 but progressive from 07/25/2023. 6 mm nodule along the minor fissure (8/64), stable. Post treatment collapse/consolidation in the left upper lobe, unchanged but with new complete obstruction of the left upper lobe bronchus. No pleural fluid. Airway is otherwise unremarkable. Upper Abdomen: Liver margin is slightly irregular with probable hypertrophy of the left hepatic lobe and caudate. Gallstones. Small right renal stones. Subcentimeter low-attenuation lesion in the lower pole right kidney, too small to characterize. No specific follow-up necessary. Visualized portions of the liver, gallbladder, adrenal glands, kidneys, spleen, pancreas, stomach and bowel are otherwise grossly unremarkable. No upper abdominal adenopathy. Musculoskeletal: Osteopenia. Degenerative changes in the spine. No worrisome lytic or sclerotic lesions. Old L1 compression fracture. IMPRESSION: 1. Masslike collapse/consolidation in the left upper lobe, with new complete obstruction of the left upper lobe bronchus, worrisome for occult recurrence. Consider PET in further evaluation, as clinically indicated. 2. Nodular consolidation in the lateral right upper lobe, similar to 11/29/2023 but progressive from 07/25/2023. This could also be further evaluated with PET. 3. 4.1 cm ascending aortic aneurysm. This can be followed on routine oncologic imaging. 4. Cirrhosis. 5. Cholelithiasis. 6. Small right renal stones. 7. Aortic atherosclerosis (ICD10-I70.0). Coronary artery calcification. 8.  Emphysema (ICD10-J43.9). Electronically Signed   By: Newell Eke M.D.   On:  03/15/2024 13:41     ASSESSMENT/PLAN:  This is a very pleasant 79 year old Caucasian male referred to the clinic for evaluation of stage IIIb non-small cell lung cancer, squamous cell carcinoma.  The patient presented with a left upper lobe lung mass as well as associated mediastinal lymphadenopathy and contralateral right hilar mild hypermetabolism.  There was heterogeneous marrow uptake in general in the spine but with more focal areas on the L4 level without imaging correlation on CT.  His PDL1 expression is 2%. He was diagnosed in August 2021. The patient also has a  stage 0 CLL.   He underwent a course of concurrent chemoradiation with weekly carboplatin  for AUC of 2 and paclitaxel  45 mg/M2 status post 5 cycles of the treatment.  He tolerated this treatment well with no concerning adverse effect except for mild odynophagia and dysphagia as well as thrombocytopenia.  He completed 13 cycles of consolidation immunotherapy with Imfinzi  1500 mg IV every 4 weeks.  This was completed in October 2022.  He had an enlarging subpleural nodule lung nodule.  He had SBRT to this lesion under the care of Dr. Shannon which was completed on 05/17/2023.  He is being monitored closely for lung nodules.  The patient was seen with Dr. Sherrod today.  Dr. Sherrod personally and independently reviewed the scan and discussed results with the patient today.  The scan showed Masslike collapse/consolidation in the left upper lobe, with new complete obstruction of the left upper lobe bronchus, worrisome for occult recurrence and nodular consolidation in the lateral right upper lobe similar to the last scan but progressive from 07/25/2023. Dr. Sherrod recommends seeing if the patient would be a candidate for bronchoscopy.   The patient has an upcoming appointment with his pulmonologist on 04/17/24. I will send him a message about our discussion today. I am unsure if the patient is too high risk given his other comorbidities  and supplemental oxygen  use.   We will also arrange for a PET scan.   We will see the patient back after his appointment with pulmonary medicine  and to review his PET scan.    The patient was advised to call immediately if she has any concerning symptoms in the interval. The patient voices understanding of current disease status and treatment options and is in agreement with the current care plan. All questions were answered. The patient knows to call the clinic with any problems, questions or concerns. We can certainly see the patient much sooner if necessary   Orders Placed This Encounter  Procedures   NM PET Image Restage (PS) Skull Base to Thigh (F-18 FDG)    Standing Status:   Future    Expected Date:   04/02/2024    Expiration Date:   03/26/2025    If indicated for the ordered procedure, I authorize the administration of a radiopharmaceutical per Radiology protocol:   Yes    Preferred imaging location?:   [facility]   CBC with Differential (Cancer Center Only)    Standing Status:   Future    Expected Date:   04/26/2024    Expiration Date:   03/26/2025   CMP (Cancer Center only)    Standing Status:   Future    Expected Date:   04/26/2024    Expiration Date:   03/26/2025     Thaila Bottoms L Zanai Mallari, PA-C 03/26/2024  ADDENDUM: Hematology/Oncology Attending: I had a face-to-face encounter with the patient today.  I reviewed his record, lab, scan and recommended his care plan.  This is a very pleasant 79 years old white male with a stage IIIb non-small cell lung cancer, squamous cell carcinoma diagnosed in August 2021 with PD-L1 expression of 2%.  He also has a history of stage 0 chronic lymphocytic leukemia diagnosed in 2016.  The patient completed a course of concurrent chemoradiation with weekly carboplatin  and paclitaxel  followed by 1 year treatment with consolidation immunotherapy with durvalumab  completed in November 2022.  He also received SBRT to right subpleural lung nodule in  February 2025.  He is currently on observation.  He had repeat CT scan of the chest performed recently.  I personally and independently reviewed the scan images and discussed the results with the patient today.  His scan showed the masslike collapse/consolidation in the left upper lobe with new complete obstruction of the left upper lobe bronchus worrisome for occult recurrence but no other evidence for disease progression. I had a lengthy discussion with the patient today about his current condition.  Will consider the patient for repeat PET scan for further evaluation of the obstructive lesion in the left upper lobe but we will also recommend for him to have repeat bronchoscopy if feasible by his pulmonologist Dr. Kara to see if there is any obstructive endobronchial lesion. The patient agreed to the current plan. He will come back for follow-up visit in 1 months for evaluation and discussion of his treatment options based on the PET scan and bronchoscopy if done. He was advised to call immediately if he has any other concerning symptoms in the interval. Disclaimer: This note was dictated with voice recognition software. Similar sounding words can inadvertently be transcribed and may be missed upon review. Sherrod MARLA Sherrod, MD

## 2024-03-26 ENCOUNTER — Inpatient Hospital Stay: Attending: Physician Assistant | Admitting: Physician Assistant

## 2024-03-26 VITALS — BP 123/85 | HR 85 | Temp 97.3°F | Resp 18 | Ht 70.5 in | Wt 178.0 lb

## 2024-03-26 DIAGNOSIS — Z87891 Personal history of nicotine dependence: Secondary | ICD-10-CM | POA: Diagnosis not present

## 2024-03-26 DIAGNOSIS — Z9221 Personal history of antineoplastic chemotherapy: Secondary | ICD-10-CM | POA: Insufficient documentation

## 2024-03-26 DIAGNOSIS — R918 Other nonspecific abnormal finding of lung field: Secondary | ICD-10-CM | POA: Diagnosis not present

## 2024-03-26 DIAGNOSIS — C3492 Malignant neoplasm of unspecified part of left bronchus or lung: Secondary | ICD-10-CM

## 2024-03-26 DIAGNOSIS — C3490 Malignant neoplasm of unspecified part of unspecified bronchus or lung: Secondary | ICD-10-CM

## 2024-03-26 DIAGNOSIS — C9110 Chronic lymphocytic leukemia of B-cell type not having achieved remission: Secondary | ICD-10-CM | POA: Insufficient documentation

## 2024-03-26 DIAGNOSIS — Z85118 Personal history of other malignant neoplasm of bronchus and lung: Secondary | ICD-10-CM | POA: Diagnosis present

## 2024-03-26 DIAGNOSIS — Z923 Personal history of irradiation: Secondary | ICD-10-CM | POA: Diagnosis not present

## 2024-04-03 ENCOUNTER — Ambulatory Visit (HOSPITAL_COMMUNITY)
Admission: RE | Admit: 2024-04-03 | Discharge: 2024-04-03 | Disposition: A | Discharge status: Home / Self Care | Source: Ambulatory Visit | Attending: Physician Assistant | Admitting: Physician Assistant

## 2024-04-03 DIAGNOSIS — J439 Emphysema, unspecified: Secondary | ICD-10-CM | POA: Insufficient documentation

## 2024-04-03 DIAGNOSIS — S2232XA Fracture of one rib, left side, initial encounter for closed fracture: Secondary | ICD-10-CM | POA: Insufficient documentation

## 2024-04-03 DIAGNOSIS — X58XXXA Exposure to other specified factors, initial encounter: Secondary | ICD-10-CM | POA: Insufficient documentation

## 2024-04-03 DIAGNOSIS — C3490 Malignant neoplasm of unspecified part of unspecified bronchus or lung: Secondary | ICD-10-CM | POA: Insufficient documentation

## 2024-04-03 DIAGNOSIS — I700 Atherosclerosis of aorta: Secondary | ICD-10-CM | POA: Diagnosis not present

## 2024-04-03 DIAGNOSIS — N200 Calculus of kidney: Secondary | ICD-10-CM | POA: Diagnosis not present

## 2024-04-03 LAB — GLUCOSE, CAPILLARY: Glucose-Capillary: 105 mg/dL — ABNORMAL HIGH (ref 70–99)

## 2024-04-03 MED ORDER — FLUDEOXYGLUCOSE F - 18 (FDG) INJECTION
8.8100 | Freq: Once | INTRAVENOUS | Status: AC
  Administered 2024-04-03: 8.81 via INTRAVENOUS

## 2024-04-17 ENCOUNTER — Telehealth: Payer: Self-pay

## 2024-04-17 ENCOUNTER — Ambulatory Visit: Admitting: Pulmonary Disease

## 2024-04-17 NOTE — Telephone Encounter (Signed)
 Copied from CRM [id number]. Topic: Clinical - Medical Advice >> Apr 17, 2024 10:51 AM Corean SAUNDERS wrote: Reason for CRM: Patient is requesting to speak with one of the nurses on Dr. Ples care team regarding a question he has about his bronchoscopy.   Reached out to MD for advisement

## 2024-04-17 NOTE — Telephone Encounter (Unsigned)
 Copied from CRM #[id number]. Topic: Clinical - Medical Advice >> Apr 16, 2024 11:54 AM Dedra NOVAK wrote: Reason for CRM: Patient said he scheduled appointment with [facility] since Dr. Donia had no availability. He needed the appointment to because a CT scan done by his oncologist showed an upper left lobe blockage. His oncologist wanted him to follow up with Dr. Kara to see if he could do a bronchoscopy. Patient wants to know if he should keep appointment with [facility].

## 2024-04-18 ENCOUNTER — Ambulatory Visit

## 2024-04-18 ENCOUNTER — Telehealth: Payer: Self-pay | Admitting: Medical Oncology

## 2024-04-18 NOTE — Telephone Encounter (Signed)
 Pt concerned about CT findings and seeing pulmonary. He  cancelled appts with pulmonary this week x 2  due to weather.   He said he cannot get to his appt next Monday for his visit with [facility]. He agreed to a phone a phone visit . I will cancel his lab appt.  He said Dr. Kara can see him  April 1st.  He asked if he should see a NP at Dr. Ples office ?  I told him that he should see a NP . He said it will be next week when the roads are dry. This will be discussed at his visit on Monday. Pt voiced understanding.

## 2024-04-20 ENCOUNTER — Telehealth: Payer: Self-pay | Admitting: Pulmonary Disease

## 2024-04-20 DIAGNOSIS — R918 Other nonspecific abnormal finding of lung field: Secondary | ICD-10-CM | POA: Insufficient documentation

## 2024-04-20 NOTE — Telephone Encounter (Signed)
 Received message from Oncology team regarding updated PET scan. Recommendation for updated biopsies via EBUS.  I spoke with patient and he prefers to be scheduled the week of February 9th due to the weather.   Advised to hold eliquis  at least 2 days prior to the procedure. ------------------------------------------------------------------------------------------------------------------------------- Please schedule the following:  Provider performing procedure: Tekela Garguilo Diagnosis: Left Upper Lobe Lung Mass Which side if for nodule / mass? left Procedure: EBUS  Has patient been spoken to by Provider and given informed consent? Yes Anesthesia: General Do you need Fluro? no Duration of procedure: 1 hour Date: 05/01/24 Alternate Date: n/a  Time:  PM Location: [facility] or [facility], whichever available Does patient have OSA? Yes DM? No Or Latex allergy? No Medication Restriction/ Anticoagulate/Antiplatelet: hold eliquis  at least 2 days prior to procedure Pre-op Labs Ordered:determined by Anesthesia Imaging request: n/a  (If, SuperDimension CT Chest, please have STAT courier sent to ENDO)   Thanks, JD

## 2024-04-23 ENCOUNTER — Inpatient Hospital Stay: Attending: Physician Assistant | Admitting: Internal Medicine

## 2024-04-23 ENCOUNTER — Inpatient Hospital Stay

## 2024-04-23 DIAGNOSIS — Z9221 Personal history of antineoplastic chemotherapy: Secondary | ICD-10-CM

## 2024-04-23 DIAGNOSIS — C3492 Malignant neoplasm of unspecified part of left bronchus or lung: Secondary | ICD-10-CM

## 2024-04-23 DIAGNOSIS — C9110 Chronic lymphocytic leukemia of B-cell type not having achieved remission: Secondary | ICD-10-CM

## 2024-04-23 DIAGNOSIS — Z923 Personal history of irradiation: Secondary | ICD-10-CM

## 2024-04-23 DIAGNOSIS — C3412 Malignant neoplasm of upper lobe, left bronchus or lung: Secondary | ICD-10-CM | POA: Diagnosis not present

## 2024-04-23 NOTE — Progress Notes (Signed)
 " [facility] Telephone:([phone]) [phone]   Fax:([phone]  PROGRESS NOTE FOR TELEMEDICINE VISITS  Joe [name], Joe GAILS, MD [address] [place] [address]  I connected withNAME@ on 04/23/24 at 11:30 AM EST by telephone visit and verified that I am speaking with the correct person using two identifiers.   I discussed the limitations, risks, security and privacy concerns of performing an evaluation and management service by telemedicine and the availability of in-person appointments. I also discussed with the patient that there may be a patient responsible charge related to this service. The patient expressed understanding and agreed to proceed.  Other persons participating in the visit and their role in the encounter:  None  Patient's location: Home Provider's location: [facility]  DIAGNOSIS: 1) Stage IIIb non-small cell lung cancer, squamous cell carcinoma.  The patient presented with a left upper lobe lung mass as well as associated mediastinal lymphadenopathy and contralateral right hilar mild hypermetabolism.  There was heterogeneous marrow uptake in the spine but with more focal areas at L4 without imaging correlation on CT scan.  The patient was diagnosed in August 2021. 2) CLL stage 0, diagnosed in 2016   PD-L1 expression: 2%   PRIOR THERAPY:  1) Concurrent chemoradiation with carboplatin  for an AUC of 2 and paclitaxel  45 mg per metered squared weekly.  First dose expected on 12/13/2019. Status post 5 cycles.   Last dose was giving January 14, 2020. 2) Consolidation treatment with immunotherapy with Imfinzi  1500 mg IV every 4 weeks.  First dose February 19, 2020.  Status post 13 cycles.   CURRENT THERAPY: Observation.  INTERVAL HISTORY: Joe [name] 79 y.o. male has a telephone virtual visit with me today for evaluation and discussion of his recent PET scan evaluation of his condition.Discussed the use of AI scribe software for clinical  note transcription with the patient, who gave verbal consent to proceed.  History of Present Illness Joe [name] is a 79 year old male with stage IIIB non-small cell lung cancer and chronic lymphocytic leukemia who presents for oncology follow-up for suspected local recurrence of lung cancer.  He was diagnosed with stage IIIB non-small cell lung cancer (squamous cell carcinoma, left upper lobe) in August 2021 and completed definitive concurrent chemoradiation with weekly carboplatin  and paclitaxel , followed by one year of consolidation immunotherapy with durvalumab . He has been under observation since November 2022.  On April 03, 2024, PET imaging demonstrated progressive mass-like consolidation in the left upper lobe with hypermetabolic activity.  He reports persistent tachycardia in the 90s and exertional dyspnea, describing significant fatigue after minimal activity. He denies major acute pulmonary symptoms, recent COPD exacerbations, fever, or other acute symptoms. He has remained indoors since April 04, 2024, due to cold weather and road conditions.  He discussed with pulmonology the possibility of further evaluation, including bronchoscopy and biopsy. He expresses concern regarding bronchial obstruction and possible cancer recurrence.  He also has chronic lymphocytic leukemia, diagnosed in 2016, which remains stable and asymptomatic.      MEDICAL HISTORY: Past Medical History:  Diagnosis Date   Alcohol  abuse, daily use 08/24/2010   Stopped 2015    Aortic stenosis    Mild with mean aortic valve gradient 12 mmHg by echo 07/2023   Arthralgia 12/13/2013   9/15 Not related to statins OA    Ascending aorta dilatation    41 mm by echo 07/2023   Cataract    Chronic lymphocytic leukemia (HCC) 07/18/2014   chronic stage  1- no symtoms   CLL (chronic lymphocytic leukemia) (HCC) 08/08/2014   05-16-14 Dr Lanny Stage 0   COPD mixed type (HCC) 07/26/2007   Smoker - stopped 6/14    De  Quervain's tenosynovitis, right December 29, 2013   2015    Depression    at times   Dysrhythmia    PAF   Dysuria 12-29-13   9/15 - poss stricture Urol ref was offered    Gallstones 11/16/2017   Asymptomatic Pt refused surg ref   Generalized anxiety disorder 09/07/2012   Chronic   Potential benefits of a long term steroid  use as well as potential risks  and complications were explained to the patient and were aknowledged.      GERD 12/02/2006   Chronic      Grade I diastolic dysfunction 01/20/2022   Grief 05-30-2016   [name] died in May 16, 2014   Gynecomastia 29-Dec-2013   Benign B 2015    Hyperlipidemia    Hypertension    Hypothyroidism 12/25/2014   2014/05/16 On Levothyroxine     Intertrigo 02/02/2012   11/13    Neoplasm of uncertain behavior of skin 03/12/2013   12/14 R ear, chest    OSA on CPAP    mild with AHI 9/hr and oxygen  desats as low as 75%   Paresis (HCC)    right- s/p cerv decompression   PERIORBITAL CELLULITIS 02/22/2009   Qualifier: Diagnosis of  By: Avelina MD, Amy     Retinal detachment    L>>R    ALLERGIES:  has no known allergies.  MEDICATIONS:  Current Outpatient Medications  Medication Sig Dispense Refill   albuterol  (PROVENTIL ) (2.5 MG/3ML) 0.083% nebulizer solution Take 3 mLs (2.5 mg total) by nebulization every 6 (six) hours as needed for wheezing or shortness of breath. 75 mL 12   amLODipine  (NORVASC ) 5 MG tablet TAKE 1 TABLET BY MOUTH EVERY DAY 90 tablet 1   atenolol  (TENORMIN ) 25 MG tablet TAKE 1 TABLET(25 MG) BY MOUTH THREE TIMES DAILY AS NEEDED FOR PALPITATIONS 270 tablet 3   Cholecalciferol  25 MCG (1000 UT) tablet Take 1,000 Units by mouth daily.     diazepam  (VALIUM ) 5 MG tablet Take 1 tablet (5 mg total) by mouth every 12 (twelve) hours as needed for anxiety. 180 tablet 1   ELIQUIS  5 MG TABS tablet TAKE 1 TABLET(5 MG) BY MOUTH TWICE DAILY 60 tablet 11   levothyroxine  (SYNTHROID ) 137 MCG tablet TAKE 1 TABLET(137 MCG) BY MOUTH DAILY BEFORE BREAKFAST 30  tablet 2   lovastatin  (MEVACOR ) 20 MG tablet TAKE 1 TABLET BY MOUTH EVERY NIGHT AT BEDTIME 90 tablet 2   Multiple Vitamin (MULTIVITAMIN) tablet Take 1 tablet by mouth daily. Centrum Silver.     Polyethyl Glycol-Propyl Glycol (SYSTANE ULTRA OP) Place 1 drop into both eyes 2 (two) times daily as needed (dry eyes).     No current facility-administered medications for this visit.    SURGICAL HISTORY:  Past Surgical History:  Procedure Laterality Date   BICEPS TENDON REPAIR     BRONCHIAL BIOPSY  10/23/2019   Procedure: BRONCHIAL BIOPSIES;  Surgeon: Shelah Lamar RAMAN, MD;  Location: [facility] ENDOSCOPY;  Service: Pulmonary;;   BRONCHIAL BIOPSY  11/20/2019   Procedure: BRONCHIAL BIOPSIES;  Surgeon: Shelah Lamar RAMAN, MD;  Location: [facility] ENDOSCOPY;  Service: Pulmonary;;   BRONCHIAL BRUSHINGS  10/23/2019   Procedure: BRONCHIAL BRUSHINGS;  Surgeon: Shelah Lamar RAMAN, MD;  Location: [facility] ENDOSCOPY;  Service: Pulmonary;;   BRONCHIAL BRUSHINGS  11/20/2019   Procedure: BRONCHIAL  BRUSHINGS;  Surgeon: Shelah Lamar RAMAN, MD;  Location: [facility] ENDOSCOPY;  Service: Pulmonary;;   BRONCHIAL NEEDLE ASPIRATION BIOPSY  10/23/2019   Procedure: BRONCHIAL NEEDLE ASPIRATION BIOPSIES;  Surgeon: Shelah Lamar RAMAN, MD;  Location: [facility] ENDOSCOPY;  Service: Pulmonary;;   BRONCHIAL NEEDLE ASPIRATION BIOPSY  11/20/2019   Procedure: BRONCHIAL NEEDLE ASPIRATION BIOPSIES;  Surgeon: Shelah Lamar RAMAN, MD;  Location: [facility] ENDOSCOPY;  Service: Pulmonary;;   BRONCHIAL WASHINGS  11/20/2019   Procedure: BRONCHIAL WASHINGS;  Surgeon: Shelah Lamar RAMAN, MD;  Location: [facility] ENDOSCOPY;  Service: Pulmonary;;   CATARACT EXTRACTION Left    COLONOSCOPY  04-14-99   Dr Margaretann polyp-TA in epic   IR BALLOON DILATION OF BILIARY DUCTS/AMPULLA  05/04/2022   IR CONVERT BILIARY DRAIN TO INT EXT BILIARY DRAIN  05/04/2022   IR CONVERT BILIARY DRAIN TO INT EXT BILIARY DRAIN  05/27/2022   IR EXCHANGE BILIARY DRAIN  02/02/2022   IR EXCHANGE BILIARY DRAIN  03/19/2022   IR EXCHANGE BILIARY DRAIN   07/13/2022   IR EXCHANGE BILIARY DRAIN  09/07/2022   IR EXCHANGE BILIARY DRAIN  11/30/2022   IR EXCHANGE BILIARY DRAIN  02/22/2023   IR EXCHANGE BILIARY DRAIN  05/24/2023   IR EXCHANGE BILIARY DRAIN  08/16/2023   IR PERC CHOLECYSTOSTOMY  01/21/2022   IR RADIOLOGIST EVAL & MGMT  04/08/2022   IR RADIOLOGIST EVAL & MGMT  05/17/2022   IR RADIOLOGIST EVAL & MGMT  09/13/2023   IR RADIOLOGIST EVAL & MGMT  09/20/2023   IR REMOVAL OF CALCULI/DEBRIS BILIARY DUCT/GB  05/04/2022   POLYPECTOMY  04-14-99   POSTERIOR LAMINECTOMY / DECOMPRESSION CERVICAL SPINE     Dr Onetha   RETINAL DETACHMENT SURGERY     left eye, 2007 x2, 2008 x 3   ROTATOR CUFF REPAIR  2004   right   TONSILLECTOMY  8048,8036   VIDEO BRONCHOSCOPY WITH ENDOBRONCHIAL NAVIGATION N/A 10/23/2019   Procedure: VIDEO BRONCHOSCOPY WITH ENDOBRONCHIAL NAVIGATION;  Surgeon: Shelah Lamar RAMAN, MD;  Location: [facility] ENDOSCOPY;  Service: Pulmonary;  Laterality: N/A;   VIDEO BRONCHOSCOPY WITH ENDOBRONCHIAL NAVIGATION N/A 11/20/2019   Procedure: VIDEO BRONCHOSCOPY WITH ENDOBRONCHIAL NAVIGATION;  Surgeon: Shelah Lamar RAMAN, MD;  Location: [facility] ENDOSCOPY;  Service: Pulmonary;  Laterality: N/A;   VIDEO BRONCHOSCOPY WITH ENDOBRONCHIAL ULTRASOUND N/A 10/23/2019   Procedure: VIDEO BRONCHOSCOPY WITH ENDOBRONCHIAL ULTRASOUND;  Surgeon: Shelah Lamar RAMAN, MD;  Location: [facility] ENDOSCOPY;  Service: Pulmonary;  Laterality: N/A;    REVIEW OF SYSTEMS:  Constitutional: positive for fatigue Eyes: negative Ears, nose, mouth, throat, and face: negative Respiratory: positive for cough and dyspnea on exertion Cardiovascular: negative Gastrointestinal: negative Genitourinary:negative Integument/breast: negative Hematologic/lymphatic: negative Musculoskeletal:negative Neurological: negative Behavioral/Psych: negative Endocrine: negative Allergic/Immunologic: negative     LABORATORY DATA: Lab Results  Component Value Date   WBC 13.3 (H) 03/07/2024   HGB 15.1 03/07/2024   HCT 47.9  03/07/2024   MCV 85.8 03/07/2024   PLT 363 03/07/2024      Chemistry      Component Value Date/Time   NA 139 03/07/2024 1129   NA 143 03/10/2017 0911   K 4.3 03/07/2024 1129   K 4.7 03/10/2017 0911   CL 100 03/07/2024 1129   CO2 31 03/07/2024 1129   CO2 30 (H) 03/10/2017 0911   BUN 9 03/07/2024 1129   BUN 10.7 03/10/2017 0911   CREATININE 0.92 03/07/2024 1129   CREATININE 0.8 03/10/2017 0911      Component Value Date/Time   CALCIUM 9.7 03/07/2024 1129   CALCIUM 9.4 03/10/2017  0911   ALKPHOS 97 03/07/2024 1129   ALKPHOS 76 03/10/2017 0911   AST 22 03/07/2024 1129   AST 34 03/10/2017 0911   ALT 11 03/07/2024 1129   ALT 45 03/10/2017 0911   BILITOT 0.6 03/07/2024 1129   BILITOT 1.41 (H) 03/10/2017 0911       RADIOGRAPHIC STUDIES: NM PET Image Restage (PS) Skull Base to Thigh (F-18 FDG) Result Date: 04/09/2024 CLINICAL DATA:  Subsequent treatment strategy for non-small cell lung cancer. Assess treatment response. EXAM: NUCLEAR MEDICINE PET SKULL BASE TO THIGH TECHNIQUE: 8.81 mCi F-18 FDG was injected intravenously. Full-ring PET imaging was performed from the skull base to thigh after the radiotracer. CT data was obtained and used for attenuation correction and anatomic localization. Fasting blood glucose: 105 mg/dl COMPARISON:  Chest CT 87/82/7974 and 11/29/2023.  PET-CT 03/07/2023. FINDINGS: Mediastinal blood pool activity: SUV max 2.5 NECK: Mildly hypermetabolic left supraclavicular node measures 1.1 cm short axis on image 54/4 and has an SUV max of 3.1. No other hypermetabolic cervical lymph nodes identified. No suspicious activity identified within the pharyngeal mucosal space. Incidental CT findings: Bilateral carotid atherosclerosis. CHEST: The progressive, partially calcified mass-like consolidation within the left upper lobe is associated with progressive hypermetabolic activity. This activity is greatest peripherally within an inferior component contiguous with the left  hilum demonstrating an SUV max of 15.4. Superiorly, the SUV max is 9.5. There is persistent hypermetabolic activity peripherally in the right upper lobe (SUV max 4.6). This corresponds with the ill-defined subpleural nodular density on CT and shows decreased metabolic activity compared with the previous PET-CT (SUV max 5.9 at that time). No other hypermetabolic pulmonary activity or suspicious nodularity. There is a stable 6 mm perifissural nodule along the minor fissure on image 30/7. Apart from the activity in the left hilum, there are no hypermetabolic thoracic lymph nodes. Incidental CT findings: Moderate centrilobular and paraseptal emphysema. Atherosclerosis of the aorta, great vessels and coronary arteries. Calcifications of the aortic valve. ABDOMEN/PELVIS: There is no hypermetabolic activity within the liver, adrenal glands, spleen or pancreas. There is no hypermetabolic nodal activity in the abdomen or pelvis. Incidental CT findings: Calcified gallstones and nonobstructing right renal calculi are demonstrated. There is aortic and branch vessel atherosclerosis. The urinary bladder appears mildly trabeculated with a small left-sided diverticulum. SKELETON: There is no hypermetabolic activity to suggest osseous metastatic disease. Incidental CT findings: Healing fracture of the left 8th rib laterally with associated low level metabolic activity (SUV max 4.4). Multilevel spondylosis. Previous posterior fusion within the lower cervical spine. Grossly stable chronic compression deformities at L1 and L4. IMPRESSION: 1. The progressive mass-like consolidation within the left upper lobe is associated with progressive hypermetabolic activity, consistent with local recurrence of lung cancer. 2. Persistent hypermetabolic activity peripherally in the right upper lobe corresponding with the ill-defined subpleural nodular density on CT. This shows decreased metabolic activity compared with the previous PET-CT and could  reflect residual metastatic disease or treatment changes. 3. Mildly hypermetabolic left supraclavicular lymph node, suspicious for nodal metastasis. 4. No evidence of distant metastatic disease. 5. Healing left 8th rib fracture. 6. Aortic Atherosclerosis (ICD10-I70.0) and Emphysema (ICD10-J43.9). Electronically Signed   By: Elsie Perone M.D.   On: 04/09/2024 09:39    ASSESSMENT AND PLAN: This is a very pleasant 79 years old white male with a stage IIIb non-small cell lung cancer, squamous cell carcinoma diagnosed in August of 2021.  The patient also has a history of stage 0 CLL.  He underwent a course  of concurrent chemoradiation with weekly carboplatin  for AUC of 2 and paclitaxel  45 mg/M2 status post 5 cycles of the treatment.  He tolerated this treatment well with no concerning adverse effect except for mild odynophagia and dysphagia as well as thrombocytopenia. His scan showed improvement of his disease with decrease in the size of the left upper lobe lung mass as well as decrease in the mediastinal lymphadenopathy and no evidence of metastatic disease. The patient completed consolidation treatment with immunotherapy with Imfinzi  1500 mg IV every 4 weeks status post 13 cycles. Previous CT scan of the chest showed the left upper lobe postradiation changes with associated areas of increased soft tissue concerning for evolving postradiation changes or recurrent disease and PET scan was recommended for further evaluation.  He also had slightly increased size of subcarinal lymph node.  The patient had a PET scan performed on January 15, 2022 and that showed a stable appearance of the masslike architecture and fibrosis with volume loss involving the left upper lobe and left upper hilar region with low-level tracer uptake with no signs of tracer avid tumor recurrence or metastatic disease.  He continues to have regular CT scan of the chest for evaluation of his condition.  He is currently on observation. He  had repeat PET scan on April 06, 2024 and that showed progressive masslike consolidation within the left upper lobe associated with progressive hypermetabolic activity consistent with local recurrence of lung cancer.  There was also persistent hypermetabolic activity peripherally in the right upper lobe corresponding with the ill-defined subpleural nodular density but decreased compared to the previous PET scan and could reflect residual metastatic disease or treatment changes.  There was also a mildly hypermetabolic left supraclavicular lymph nodes suspicious for nodal metastasis. Assessment and Plan Assessment & Plan Stage IIIb non-small cell lung cancer, squamous cell carcinoma of the left upper lobe with local recurrence Progressive mass-like consolidation in the left upper lobe with hypermetabolic activity and bronchial obstruction on PET scan, consistent with local recurrence. No evidence of extrathoracic metastatic disease. Prior high-dose radiation may limit further local radiation options. Malignant bronchial obstruction is likely, though mucus plug remains a differential. Diagnostic clarification is required to guide management; systemic therapy may be indicated if recurrence is confirmed. Bronchoscopy carries risks of bleeding, infection, and airway compromise. Systemic therapy risks include immunotherapy and chemotherapy toxicities. - Scheduled bronchoscopy with biopsy and possible debulking of the obstructed left upper lobe bronchus for May 01, 2024. - Arranged follow-up in approximately three weeks post-bronchoscopy to review biopsy results and determine further management. - If biopsy confirms recurrence, considered re-initiation of systemic therapy (immunotherapy or chemoimmunotherapy) based on pathology and disease status. - Local radiation may be considered, but prior high-dose radiation to the area may limit feasibility.  Chronic lymphocytic leukemia (CLL), stage 0 CLL diagnosed  in 2016. The patient was advised to call immediately if he has any other concerning symptoms in the interval. I discussed the assessment and treatment plan with the patient. The patient was provided an opportunity to ask questions and all were answered. The patient agreed with the plan and demonstrated an understanding of the instructions.   The patient was advised to call back or seek an in-person evaluation if the symptoms worsen or if the condition fails to improve as anticipated.  I provided 30 minutes of non face-to-face telephone visit time during this encounter, and > 50% was spent counseling as documented under my assessment & plan.  Sherrod MARLA Sherrod, MD 04/23/2024 11:30 AM  Disclaimer: This note was dictated with voice recognition software. Similar sounding words can inadvertently be transcribed and may not be corrected upon review.        "

## 2024-04-24 ENCOUNTER — Encounter: Payer: Self-pay | Admitting: Pulmonary Disease

## 2024-04-26 ENCOUNTER — Encounter: Payer: Self-pay | Admitting: Pulmonary Disease

## 2024-04-27 ENCOUNTER — Telehealth: Payer: Self-pay

## 2024-04-27 NOTE — Telephone Encounter (Signed)
 Spoke with pt he had some questions  if they would biopsy all nodules in question. I did advise pt that generally they do biopsies of all areas in question if they fill it is needed he also wanted to know since Dr A is doing the bronch if he would review all the scans before I assured him that he would. Pt states this is all he needed to know and that I had answered his questions in entirety. NFN

## 2024-04-27 NOTE — Telephone Encounter (Signed)
 Copied from CRM [id number]. Topic: Clinical - Medical Advice >> Apr 27, 2024  8:32 AM Corean SAUNDERS wrote: Reason for CRM: Patient is requesting a phone call back as he would like to ask the details of what all he can expect during his bronchoscopy on 2/13

## 2024-04-27 NOTE — Telephone Encounter (Signed)
 Pcc's just schedule and do the letters this would be for clinical or a provider.

## 2024-05-01 DIAGNOSIS — R918 Other nonspecific abnormal finding of lung field: Secondary | ICD-10-CM | POA: Insufficient documentation

## 2024-05-04 ENCOUNTER — Encounter: Admission: RE | Payer: Self-pay

## 2024-05-04 ENCOUNTER — Ambulatory Visit (HOSPITAL_COMMUNITY): Admission: RE | Admit: 2024-05-04 | Source: Home / Self Care | Admitting: Pulmonary Disease

## 2024-05-04 DIAGNOSIS — R918 Other nonspecific abnormal finding of lung field: Secondary | ICD-10-CM | POA: Insufficient documentation

## 2024-05-08 ENCOUNTER — Ambulatory Visit: Admitting: Acute Care

## 2024-05-11 ENCOUNTER — Ambulatory Visit: Admitting: Acute Care

## 2024-05-21 ENCOUNTER — Ambulatory Visit: Admitting: Internal Medicine

## 2024-06-20 ENCOUNTER — Ambulatory Visit: Admitting: Pulmonary Disease

## 2024-09-06 ENCOUNTER — Encounter (INDEPENDENT_AMBULATORY_CARE_PROVIDER_SITE_OTHER): Admitting: Ophthalmology

## 2025-03-11 ENCOUNTER — Encounter: Admitting: Internal Medicine

## 2025-03-11 ENCOUNTER — Ambulatory Visit
